# Patient Record
Sex: Male | Born: 1950 | Race: White | Hispanic: No | Smoking: Current every day smoker
Health system: Southern US, Community
[De-identification: ages and names within clinical notes are randomized; demographics above are authoritative.]

## PROBLEM LIST (undated history)

## (undated) DIAGNOSIS — Z720 Tobacco use: Secondary | ICD-10-CM

## (undated) DIAGNOSIS — I10 Essential (primary) hypertension: Secondary | ICD-10-CM

## (undated) DIAGNOSIS — I4892 Unspecified atrial flutter: Principal | ICD-10-CM

## (undated) DIAGNOSIS — E669 Obesity, unspecified: Secondary | ICD-10-CM

## (undated) DIAGNOSIS — I480 Paroxysmal atrial fibrillation: Secondary | ICD-10-CM

## (undated) DIAGNOSIS — M109 Gout, unspecified: Secondary | ICD-10-CM

## (undated) DIAGNOSIS — F419 Anxiety disorder, unspecified: Secondary | ICD-10-CM

## (undated) DIAGNOSIS — J189 Pneumonia, unspecified organism: Secondary | ICD-10-CM

## (undated) DIAGNOSIS — D649 Anemia, unspecified: Secondary | ICD-10-CM

## (undated) DIAGNOSIS — R825 Elevated urine levels of drugs, medicaments and biological substances: Secondary | ICD-10-CM

## (undated) DIAGNOSIS — K219 Gastro-esophageal reflux disease without esophagitis: Secondary | ICD-10-CM

## (undated) DIAGNOSIS — I509 Heart failure, unspecified: Secondary | ICD-10-CM

## (undated) DIAGNOSIS — Z87442 Personal history of urinary calculi: Secondary | ICD-10-CM

## (undated) DIAGNOSIS — Z8739 Personal history of other diseases of the musculoskeletal system and connective tissue: Secondary | ICD-10-CM

## (undated) DIAGNOSIS — R0682 Tachypnea, not elsewhere classified: Secondary | ICD-10-CM

## (undated) DIAGNOSIS — R0602 Shortness of breath: Secondary | ICD-10-CM

## (undated) DIAGNOSIS — M069 Rheumatoid arthritis, unspecified: Secondary | ICD-10-CM

## (undated) HISTORY — DX: Paroxysmal atrial fibrillation: I48.0

## (undated) HISTORY — PX: JOINT REPLACEMENT: SHX530

---

## 2007-09-02 ENCOUNTER — Ambulatory Visit (HOSPITAL_COMMUNITY): Admission: RE | Admit: 2007-09-02 | Discharge: 2007-09-02 | Payer: Self-pay | Admitting: Otolaryngology

## 2007-09-02 ENCOUNTER — Encounter (INDEPENDENT_AMBULATORY_CARE_PROVIDER_SITE_OTHER): Payer: Self-pay | Admitting: Otolaryngology

## 2007-09-02 HISTORY — PX: MICROLARYNGOSCOPY: SHX5208

## 2010-11-11 NOTE — Op Note (Signed)
NAME:  Brian [name], Brian [name]              ACCOUNT NO.:  [id number]   MEDICAL RECORD NO.:  [id number]          PATIENT TYPE:  AMB   LOCATION:  SDS                          FACILITY:  [facility]   PHYSICIAN:  Jefry H. Pollyann Kennedy, MD     DATE OF BIRTH:  1950-09-22   DATE OF PROCEDURE:  09/02/2007  DATE OF DISCHARGE:  09/02/2007                               OPERATIVE REPORT   PREPROCEDURE DIAGNOSIS:  Vocal cord mass and chronic hoarseness.   POSTOPERATIVE DIAGNOSIS:  Vocal cord mass and chronic hoarseness.   PROCEDURE:  Microlaryngoscopy with excision of right vocal cord mass.   SURGEON:  Jefry H. Pollyann Kennedy, MD   ANESTHESIA:  General endotracheal anesthesia was used.   COMPLICATIONS:  No complications.   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  There is a small, corresponding swollen area on the  left side, but it appeared to be reactionary to contact with the right-  sided mass.  No other findings.   REFERRING PHYSICIAN:  [facility]   HISTORY:  This is a 60 year old gentleman with a long smoking history  and a history of chronic hoarseness for the past 4 months.  Risks,  benefits, alternatives, complications of the procedure were explained to  the patient, who seemed to understand, agreed to surgery.   PROCEDURE IN DETAIL:  The patient was taken to the operating room and  placed on the operating table in supine position.  Following induction  of general endotracheal anesthesia, the table was turned 90 degrees and  the patient was draped in a standard fashion.  A laser laryngoscope was  used to view the larynx and this was attached to the Mayo stand using  the suspension apparatus.  A microscope was brought into the field and  was used to further inspect the larynx.  The above-mentioned findings  were noted.   A microlaryngoscopy syringe was used to inject  Xylocaine with  epinephrine into the membranous fold, and the submucosal plane.  An  upbiting microlaryngoscopy scissors was used to incise the superior  mucosa at the base of the lesion, and then to carefully dissect away the  lesion and then to incise the inferior mucosa removing the entire  lesion.  This was sent for pathologic evaluation.  The mucosal defect  was minimal, and the mucosal edges were approximating.  Topical  adrenalin was used on pledgets for hemostasis.  The patient was then  awakened, extubated, and transferred to recovery in stable condition.      Jefry H. Pollyann Kennedy, MD  Electronically Signed     JHR/MEDQ  D:  09/02/2007  T:  09/02/2007  Job:  [id number]   cc:   [facility]

## 2011-03-23 LAB — CBC
Hemoglobin: 14.8
RBC: 4.71
WBC: 10.9 — ABNORMAL HIGH

## 2012-09-04 ENCOUNTER — Observation Stay (HOSPITAL_COMMUNITY)
Admission: EM | Admit: 2012-09-04 | Discharge: 2012-09-05 | DRG: 544 | Disposition: A | Discharge status: Home / Self Care | Payer: BC Managed Care – PPO | Attending: Cardiology | Admitting: Cardiology

## 2012-09-04 ENCOUNTER — Emergency Department (HOSPITAL_COMMUNITY): Payer: BC Managed Care – PPO

## 2012-09-04 ENCOUNTER — Encounter (HOSPITAL_COMMUNITY): Payer: Self-pay | Admitting: *Deleted

## 2012-09-04 DIAGNOSIS — F411 Generalized anxiety disorder: Secondary | ICD-10-CM | POA: Insufficient documentation

## 2012-09-04 DIAGNOSIS — Z720 Tobacco use: Secondary | ICD-10-CM | POA: Diagnosis present

## 2012-09-04 DIAGNOSIS — R079 Chest pain, unspecified: Secondary | ICD-10-CM | POA: Insufficient documentation

## 2012-09-04 DIAGNOSIS — I5033 Acute on chronic diastolic (congestive) heart failure: Secondary | ICD-10-CM

## 2012-09-04 DIAGNOSIS — I5031 Acute diastolic (congestive) heart failure: Secondary | ICD-10-CM | POA: Insufficient documentation

## 2012-09-04 DIAGNOSIS — E669 Obesity, unspecified: Secondary | ICD-10-CM | POA: Insufficient documentation

## 2012-09-04 DIAGNOSIS — D72829 Elevated white blood cell count, unspecified: Secondary | ICD-10-CM | POA: Insufficient documentation

## 2012-09-04 DIAGNOSIS — R0602 Shortness of breath: Secondary | ICD-10-CM | POA: Insufficient documentation

## 2012-09-04 DIAGNOSIS — I4892 Unspecified atrial flutter: Principal | ICD-10-CM | POA: Insufficient documentation

## 2012-09-04 DIAGNOSIS — R7309 Other abnormal glucose: Secondary | ICD-10-CM | POA: Insufficient documentation

## 2012-09-04 DIAGNOSIS — I509 Heart failure, unspecified: Secondary | ICD-10-CM | POA: Insufficient documentation

## 2012-09-04 DIAGNOSIS — F17200 Nicotine dependence, unspecified, uncomplicated: Secondary | ICD-10-CM | POA: Insufficient documentation

## 2012-09-04 HISTORY — DX: Obesity, unspecified: E66.9

## 2012-09-04 HISTORY — DX: Unspecified atrial flutter: I48.92

## 2012-09-04 HISTORY — DX: Anxiety disorder, unspecified: F41.9

## 2012-09-04 HISTORY — DX: Tobacco use: Z72.0

## 2012-09-04 LAB — CBC
MCH: 30.9 pg (ref 26.0–34.0)
MCV: 93.4 fL (ref 78.0–100.0)
Platelets: 267 10*3/uL (ref 150–400)
RDW: 12.8 % (ref 11.5–15.5)
WBC: 11.9 10*3/uL — ABNORMAL HIGH (ref 4.0–10.5)

## 2012-09-04 LAB — BASIC METABOLIC PANEL
CO2: 27 mEq/L (ref 19–32)
Calcium: 8.6 mg/dL (ref 8.4–10.5)
Creatinine, Ser: 0.64 mg/dL (ref 0.50–1.35)
GFR calc non Af Amer: >90 mL/min (ref >90)
Glucose, Bld: 157 mg/dL — ABNORMAL HIGH (ref 70–99)
Sodium: 136 mEq/L (ref 135–145)

## 2012-09-04 LAB — PROTIME-INR
INR: 0.99 (ref 0.00–1.49)
Prothrombin Time: 13 seconds (ref 11.6–15.2)

## 2012-09-04 LAB — DIFFERENTIAL
Basophils Absolute: 0.1 10*3/uL (ref 0.0–0.1)
Eosinophils Absolute: 0.4 10*3/uL (ref 0.0–0.7)
Eosinophils Relative: 4 % (ref 0–5)
Neutrophils Relative %: 73 % (ref 43–77)

## 2012-09-04 LAB — POCT I-STAT TROPONIN I

## 2012-09-04 MED ORDER — DILTIAZEM HCL 100 MG IV SOLR
5.0000 mg/h | Freq: Once | INTRAVENOUS | Status: AC
  Administered 2012-09-04: 5 mg/h via INTRAVENOUS

## 2012-09-04 MED ORDER — DIGOXIN 0.1 MG/ML IJ SOLN
0.5000 mg | Freq: Once | INTRAMUSCULAR | Status: AC
  Administered 2012-09-04: 0.5 mg via INTRAVENOUS
  Filled 2012-09-04: qty 5

## 2012-09-04 MED ORDER — DILTIAZEM LOAD VIA INFUSION
20.0000 mg | Freq: Once | INTRAVENOUS | Status: AC
  Administered 2012-09-04: 20 mg via INTRAVENOUS
  Filled 2012-09-04: qty 20

## 2012-09-04 MED ORDER — FUROSEMIDE 10 MG/ML IJ SOLN
40.0000 mg | Freq: Two times a day (BID) | INTRAMUSCULAR | Status: DC
  Administered 2012-09-04: 40 mg via INTRAVENOUS
  Filled 2012-09-04 (×3): qty 4

## 2012-09-04 MED ORDER — ADENOSINE 6 MG/2ML IV SOLN
INTRAVENOUS | Status: AC
  Filled 2012-09-04: qty 2

## 2012-09-04 MED ORDER — WARFARIN VIDEO: 1.0000 | Freq: Once | Status: DC

## 2012-09-04 MED ORDER — ALPRAZOLAM 0.25 MG PO TABS: 0.2500 mg | ORAL_TABLET | Freq: Every evening | ORAL | Status: DC | PRN

## 2012-09-04 MED ORDER — SODIUM CHLORIDE 0.9 % IJ SOLN: 3.0000 mL | INTRAMUSCULAR | Status: DC | PRN

## 2012-09-04 MED ORDER — DEXTROSE 5 % IV SOLN
5.0000 mg/h | INTRAVENOUS | Status: DC
  Administered 2012-09-04: 10 mg/h via INTRAVENOUS
  Filled 2012-09-04 (×2): qty 100

## 2012-09-04 MED ORDER — SODIUM CHLORIDE 0.9 % IJ SOLN: 3.0000 mL | Freq: Two times a day (BID) | INTRAMUSCULAR | Status: DC

## 2012-09-04 MED ORDER — HEPARIN (PORCINE) IN NACL 100-0.45 UNIT/ML-% IJ SOLN
1450.0000 [IU]/h | INTRAMUSCULAR | Status: DC
  Administered 2012-09-04: 1450 [IU]/h via INTRAVENOUS
  Filled 2012-09-04 (×2): qty 250

## 2012-09-04 MED ORDER — ONDANSETRON HCL 4 MG/2ML IJ SOLN
INTRAMUSCULAR | Status: AC
  Filled 2012-09-04: qty 2

## 2012-09-04 MED ORDER — SODIUM CHLORIDE 0.9 % IV SOLN: INTRAVENOUS | Status: DC

## 2012-09-04 MED ORDER — IBUPROFEN 200 MG PO TABS
400.0000 mg | ORAL_TABLET | Freq: Once | ORAL | Status: AC
  Administered 2012-09-04: 400 mg via ORAL
  Filled 2012-09-04: qty 2

## 2012-09-04 MED ORDER — ADENOSINE 6 MG/2ML IV SOLN
6.0000 mg | Freq: Once | INTRAVENOUS | Status: AC
  Administered 2012-09-04: 6 mg via INTRAVENOUS
  Filled 2012-09-04: qty 4

## 2012-09-04 MED ORDER — ASPIRIN 81 MG PO CHEW
324.0000 mg | CHEWABLE_TABLET | Freq: Once | ORAL | Status: AC
  Administered 2012-09-04: 324 mg via ORAL
  Filled 2012-09-04: qty 4

## 2012-09-04 MED ORDER — ONDANSETRON HCL 4 MG/2ML IJ SOLN
4.0000 mg | Freq: Once | INTRAMUSCULAR | Status: AC
  Administered 2012-09-04: 4 mg via INTRAVENOUS

## 2012-09-04 MED ORDER — WARFARIN - PHARMACIST DOSING INPATIENT
Freq: Every day | Status: DC
  Administered 2012-09-04: 18:00:00

## 2012-09-04 MED ORDER — INSULIN ASPART 100 UNIT/ML ~~LOC~~ SOLN
0.0000 [IU] | Freq: Three times a day (TID) | SUBCUTANEOUS | Status: DC
  Administered 2012-09-04: 2 [IU] via SUBCUTANEOUS

## 2012-09-04 MED ORDER — DILTIAZEM HCL 25 MG/5ML IV SOLN: 20.0000 mg | Freq: Once | INTRAVENOUS | Status: DC

## 2012-09-04 MED ORDER — SODIUM CHLORIDE 0.9 % IV SOLN
INTRAVENOUS | Status: DC
  Administered 2012-09-04: 09:00:00 via INTRAVENOUS

## 2012-09-04 MED ORDER — METOPROLOL TARTRATE 12.5 MG HALF TABLET
12.5000 mg | ORAL_TABLET | Freq: Two times a day (BID) | ORAL | Status: DC
  Administered 2012-09-04 – 2012-09-05 (×2): 12.5 mg via ORAL
  Filled 2012-09-04 (×3): qty 1

## 2012-09-04 MED ORDER — LORAZEPAM 2 MG/ML IJ SOLN
INTRAMUSCULAR | Status: AC
  Administered 2012-09-04: 1 mg
  Filled 2012-09-04: qty 1

## 2012-09-04 MED ORDER — LISINOPRIL 10 MG PO TABS
10.0000 mg | ORAL_TABLET | Freq: Every day | ORAL | Status: DC
  Filled 2012-09-04: qty 1

## 2012-09-04 MED ORDER — ACETAMINOPHEN 325 MG PO TABS: 650.0000 mg | ORAL_TABLET | ORAL | Status: DC | PRN

## 2012-09-04 MED ORDER — SODIUM CHLORIDE 0.9 % IV SOLN: 250.0000 mL | INTRAVENOUS | Status: DC | PRN

## 2012-09-04 MED ORDER — HEPARIN BOLUS VIA INFUSION
5000.0000 [IU] | Freq: Once | INTRAVENOUS | Status: AC
  Administered 2012-09-04: 5000 [IU] via INTRAVENOUS
  Filled 2012-09-04 (×2): qty 5000

## 2012-09-04 MED ORDER — WARFARIN SODIUM 7.5 MG PO TABS
7.5000 mg | ORAL_TABLET | Freq: Once | ORAL | Status: AC
  Administered 2012-09-04: 7.5 mg via ORAL
  Filled 2012-09-04: qty 1

## 2012-09-04 MED ORDER — COUMADIN BOOK
1.0000 | Freq: Once | Status: DC
  Filled 2012-09-04: qty 1

## 2012-09-04 NOTE — ED Notes (Signed)
Gave I Stat 8 resulted to MD Freida Busman

## 2012-09-04 NOTE — ED Notes (Signed)
Pt reports onset of SOB and chest pain x1 week. SOB worse today. Dyspnea with exertion. Productive cough x1 week, brownish sputum. Reports extreme difficulty breathing with coughing spells. Chest pain in central chest, does not radiate. HR 165

## 2012-09-04 NOTE — Progress Notes (Signed)
Pt. Received to room 2027 via ambulance from [facility].  Pt. Oriented to unit and introduced to staff.  Bed locked in low position.  Affect pleasant.  Denies pain.

## 2012-09-04 NOTE — ED Provider Notes (Addendum)
History     CSN: [id number]  Arrival date & time 09/04/12  0807   First MD Initiated Contact with Patient 09/04/12 0830      Chief Complaint  Patient presents with  . Shortness of Breath  . Chest Pain    (Consider location/radiation/quality/duration/timing/severity/associated sxs/prior treatment) Patient is a 62 y.o. male presenting with shortness of breath and chest pain. The history is provided by the patient.  Shortness of Breath Associated symptoms: chest pain   Chest Pain Associated symptoms: shortness of breath    patient here with 3 day history of shortness of breath and chest pain associated diaphoresis. Symptoms have been persistent and nothing makes them better worse. He has no prior history of this in the past. Denies any fever or chills. No vomiting or diarrhea or bloody stools. No treatment used prior to arrival.  History reviewed. No pertinent past medical history.  History reviewed. No pertinent past surgical history.  No family history on file.  History  Substance Use Topics  . Smoking status: Current Every Day Smoker -- 0.50 packs/day    Types: Cigarettes  . Smokeless tobacco: Not on file  . Alcohol Use: Yes     Comment: socially      Review of Systems  Respiratory: Positive for shortness of breath.   Cardiovascular: Positive for chest pain.  All other systems reviewed and are negative.    Allergies  Review of patient's allergies indicates no known allergies.  Home Medications  No current outpatient prescriptions on file.  BP 147/103  Pulse 165  Temp(Src) 97.9 F (36.6 C) (Oral)  Resp 20  Ht 5\' 10"  (1.778 m)  Wt 255 lb (115.667 kg)  BMI 36.59 kg/m2  SpO2 95%  Physical Exam  Nursing note and vitals reviewed. Constitutional: He is oriented to person, place, and time. He appears well-developed and well-nourished.  Non-toxic appearance. No distress.  HENT:  Head: Normocephalic and atraumatic.  Eyes: Conjunctivae, EOM and lids are normal.  Pupils are equal, round, and reactive to light.  Neck: Normal range of motion. Neck supple. No tracheal deviation present. No mass present.  Cardiovascular: Regular rhythm and normal heart sounds.  Tachycardia present.  Exam reveals no gallop.   No murmur heard. Pulmonary/Chest: Effort normal and breath sounds normal. No stridor. No respiratory distress. He has no decreased breath sounds. He has no wheezes. He has no rhonchi. He has no rales.  Abdominal: Soft. Normal appearance and bowel sounds are normal. He exhibits no distension. There is no tenderness. There is no rebound and no CVA tenderness.  Musculoskeletal: Normal range of motion. He exhibits no edema and no tenderness.  Neurological: He is alert and oriented to person, place, and time. He has normal strength. No cranial nerve deficit or sensory deficit. GCS eye subscore is 4. GCS verbal subscore is 5. GCS motor subscore is 6.  Skin: Skin is warm and dry. No abrasion and no rash noted.  Psychiatric: He has a normal mood and affect. His speech is normal and behavior is normal.    ED Course  Procedures (including critical care time)  Labs Reviewed  CBC  DIFFERENTIAL  PROTIME-INR  APTT  BASIC METABOLIC PANEL   No results found.   No diagnosis found.    MDM   Date: 09/04/2012  Rate: 160  Rhythm: atrial flutter  QRS Axis: normal  Intervals: normal  ST/T Wave abnormalities: nonspecific ST changes  Conduction Disutrbances:none  Narrative Interpretation:   Old EKG Reviewed: none available  Patient given adenosine 6 mg IV push which did make his atrial flutter more pronounced. He was subsequently bolus of Cardizem 20 mg IV push and placed on Cardizem drip at 5 mg minute. His heart rate remained elevated. Blood pressure stable and patient was re- bolused with Cardizem 15 mg IV push as Cardizem drip was increased to 10 mg per hour. Patient given Ativan for anxiety. Heart rate transiently decreases down to the 110s but does come  up. Discussed the case with Dr. Patty Sermons, cardiology on-call, patient to be transferred to [facility] [place] to step down.  CRITICAL CARE Performed by: Toy Baker   Total critical care time: 80  Critical care time was exclusive of separately billable procedures and treating other patients.  Critical care was necessary to treat or prevent imminent or life-threatening deterioration.  Critical care was time spent personally by me on the following activities: development of treatment plan with patient and/or surrogate as well as nursing, discussions with consultants, evaluation of patient's response to treatment, examination of patient, obtaining history from patient or surrogate, ordering and performing treatments and interventions, ordering and review of laboratory studies, ordering and review of radiographic studies, pulse oximetry and re-evaluation of patient's condition.  11:54 AM Pt loaded with digoxin for rate controll         Toy Baker, MD 09/04/12 1056  Toy Baker, MD 09/04/12 1154

## 2012-09-04 NOTE — H&P (Signed)
Brian [name] is an 62 y.o. male.   PCP: None Cardiologist: None.  Chief Complaint: dysnea HPI: Came to ER today because of increased dyspnea. Has noted increase in dyspnea for the past week.  Under more stress. Roommate's father has cancer and patient was assisting with the move. Patient denies any chest pain.The patient has had no TIA symptoms. Is on no heart meds.  No prior heart history. No hypertension or diabetes. History of smoking since age 54, now down to half pack a day. Drinks one beer/week. No regular exercise.  Runs a [employer].   History reviewed. No pertinent past medical history.  History reviewed. No pertinent past surgical history.  No family history on file. Social History:  reports that he has been smoking Cigarettes.  He has been smoking about 0.50 packs per day. He does not have any smokeless tobacco history on file. He reports that  drinks alcohol. He reports that he does not use illicit drugs.  Allergies: No Known Allergies  Medications Prior to Admission  Medication Sig Dispense Refill  . vitamin B-12 (CYANOCOBALAMIN) 250 MCG tablet Take 250 mcg by mouth daily.      . vitamin C (ASCORBIC ACID) 500 MG tablet Take 500 mg by mouth 2 (two) times daily.        Results for orders placed during the hospital encounter of 09/04/12 (from the past 48 hour(s))  POCT I-STAT TROPONIN I     Status: None   Collection Time    09/04/12  8:57 AM      Result Value Range   Troponin i, poc 0.00  0.00 - 0.08 ng/mL   Comment 3            Comment: Due to the release kinetics of cTnI,     a negative result within the first hours     of the onset of symptoms does not rule out     myocardial infarction with certainty.     If myocardial infarction is still suspected,     repeat the test at appropriate intervals.  BASIC METABOLIC PANEL     Status: Abnormal   Collection Time    09/04/12 10:10 AM      Result Value Range   Sodium 136  135 - 145 mEq/L   Potassium 4.8  3.5 -  5.1 mEq/L   Chloride 102  96 - 112 mEq/L   CO2 27  19 - 32 mEq/L   Glucose, Bld 157 (*) 70 - 99 mg/dL   BUN 13  6 - 23 mg/dL   Creatinine, Ser 4.69  0.50 - 1.35 mg/dL   Calcium 8.6  8.4 - 62.9 mg/dL   GFR calc non Af Amer >90  >90 mL/min   GFR calc Af Amer >90  >90 mL/min   Comment:            The eGFR has been calculated     using the CKD EPI equation.     This calculation has not been     validated in all clinical     situations.     eGFR's persistently     <90 mL/min signify     possible Chronic Kidney Disease.  CBC     Status: Abnormal   Collection Time    09/04/12 10:10 AM      Result Value Range   WBC 11.9 (*) 4.0 - 10.5 K/uL   RBC 4.70  4.22 - 5.81 MIL/uL   Hemoglobin 14.5  13.0 - 17.0 g/dL   HCT 86.5  78.4 - 69.6 %   MCV 93.4  78.0 - 100.0 fL   MCH 30.9  26.0 - 34.0 pg   MCHC 33.0  30.0 - 36.0 g/dL   RDW 29.5  28.4 - 13.2 %   Platelets 267  150 - 400 K/uL  DIFFERENTIAL     Status: Abnormal   Collection Time    09/04/12 10:10 AM      Result Value Range   Neutrophils Relative 73  43 - 77 %   Neutro Abs 8.7 (*) 1.7 - 7.7 K/uL   Lymphocytes Relative 14  12 - 46 %   Lymphs Abs 1.7  0.7 - 4.0 K/uL   Monocytes Relative 9  3 - 12 %   Monocytes Absolute 1.1 (*) 0.1 - 1.0 K/uL   Eosinophils Relative 4  0 - 5 %   Eosinophils Absolute 0.4  0.0 - 0.7 K/uL   Basophils Relative 1  0 - 1 %   Basophils Absolute 0.1  0.0 - 0.1 K/uL  PROTIME-INR     Status: None   Collection Time    09/04/12 10:10 AM      Result Value Range   Prothrombin Time 13.0  11.6 - 15.2 seconds   INR 0.99  0.00 - 1.49  APTT     Status: None   Collection Time    09/04/12 10:10 AM      Result Value Range   aPTT 33  24 - 37 seconds  PRO B NATRIURETIC PEPTIDE     Status: Abnormal   Collection Time    09/04/12 10:24 AM      Result Value Range   Pro B Natriuretic peptide (BNP) 525.5 (*) 0 - 125 pg/mL   Dg Chest Port 1 View  09/04/2012  *RADIOLOGY REPORT*  Clinical Data: Chest pain, tachycardia   PORTABLE CHEST - 1 VIEW  Comparison: None.  Findings: Enlarged cardiac silhouette and mediastinal contours, possibly accentuated due to AP projection decreased lung volumes. Pulmonary vasculature is indistinct.  Small left-sided pleural effusion.  Bibasilar heterogeneous opacity.  No pneumothorax. Unchanged bones.  IMPRESSION: 1.  Overall finding suggestive of pulmonary edema with small left- sided effusion and bibasilar opacities, atelectasis versus infiltrate.  A follow-up chest radiograph in 4 to 6 weeks after treatment is recommended to ensure resolution.  2.  Enlarged cardiac silhouette, possibly accentuated due to decreased lung volumes and AP projection.  Further evaluation with cardiac echo may be performed as clinically indicated.   Original Report Authenticated By: Tacey Ruiz, MD    ROS: No history of cardiomegaly. Denies fever or chills.Has had cough. No GI bleeding. No dysuria. No thyroid disease.  Blood pressure 106/65, pulse 118, temperature 98.2 F (36.8 C), temperature source Oral, resp. rate 22, height 5\' 10"  (1.778 m), weight 253 lb 15.5 oz (115.2 kg), SpO2 95.00%. The patient appears to be in no distress. Lying flat without difficulty.  Head and neck exam reveals that the pupils are equal and reactive.  The extraocular movements are full.  There is no scleral icterus.  Mouth and pharynx are benign.  No lymphadenopathy.  No carotid bruits.  The jugular venous pressure is normal.  Thyroid is not enlarged or tender.  Chest reveals mild expiratory wheezing when supine.  Heart reveals no abnormal lift or heave.  First and second heart sounds are normal.  There is no murmur gallop rub or click. Pulse is rapid and irregular.  The abdomen is soft and nontender.  Bowel sounds are normoactive.  There is no hepatosplenomegaly or mass.  There are no abdominal bruits.  Extremities reveal no phlebitis or edema.  Pedal pulses are good.  There is no cyanosis or clubbing.  Neurologic exam is  normal strength and no lateralizing weakness.  No sensory deficits.  Integument reveals no rash  EKG shows atrial flutter with rapid ventricular response.  Assessment/Plan 1. Atrial flutter, uncertain duration, but by history present for more than one week. 2. Congestive heart failure, etiology to be determined (2D echo pending). 3.  Hyperglycemia, not known to be a diabetic. 4.  Exogenous obesity. 5.  Mild leukocytosis.  Plan: Admit to telemetry. Rate control with IV diltiazem and add metoprolol. Diuresis with IV lasix 40 mg BID. BP soft so delay adding ACE until echo results are known.Check A1C re ? Of diabetes. Check lipids in am. Start warfarin. If he fails to convert to NSR will consider outpatient DCCV after 4 weeks of adequate INRs.Followup CBC in am re leukocytosis.  Cassell Clement 09/04/2012, 3:52 PM

## 2012-09-04 NOTE — Consult Note (Signed)
ANTICOAGULATION CONSULT NOTE - Initial Consult  Pharmacy Consult for Coumadin with Heparin bridging Indication: A-flutter w/RVR, CHF  Allergies: No Known Allergies  Height/Weight: Height: 5\' 10"  (177.8 cm) Weight: 253 lb 15.5 oz (115.2 kg) IBW/kg (Calculated) : 73 Dosing weight 98 kg  Vital Signs: BP 106/65  Pulse 118  Temp(Src) 98.2 F (36.8 C) (Oral)  Resp 22  Ht 5\' 10"  (1.778 m)  Wt 253 lb 15.5 oz (115.2 kg)  BMI 36.44 kg/m2  SpO2 95%  Active Problems: Active Problems:   * No active hospital problems. *   Labs:  Recent Labs  09/04/12 1010  HGB 14.5  HCT 43.9  PLT 267  APTT 33  LABPROT 13.0  INR 0.99  CREATININE 0.64   Lab Results  Component Value Date   INR 0.99 09/04/2012   Estimated Creatinine Clearance: 123.3 ml/min (by C-G formula based on Cr of 0.64).  Medical / Surgical History: History reviewed. No pertinent past medical history. History reviewed. No pertinent past surgical history.  Medications:  Prescriptions prior to admission  Medication Sig Dispense Refill  . vitamin B-12 (CYANOCOBALAMIN) 250 MCG tablet Take 250 mcg by mouth daily.      . vitamin C (ASCORBIC ACID) 500 MG tablet Take 500 mg by mouth 2 (two) times daily.        Assessment:  62 y.o.male admitted with new onset of A-flutter w/RVR and CHF of unknown etiology.  Coumadin with Heparin bridging is to be started per protocol..  Baseline coags wnl.  Goal of Therapy:   INR 2-3  Heparin level 0.3-0.7 units/ml      Plan:   Heparin bolus 5000 units, then  Begin heparin infusion at 1450 units/hr.  Coumadin 7.5 mg today.  Check Heparin level in 6 hours Daily Heparin level, INR, CBC.   Stramoski, Elisha Headland,  Pharm.D.. 09/04/2012,  4:24 PM

## 2012-09-05 ENCOUNTER — Other Ambulatory Visit: Payer: Self-pay

## 2012-09-05 ENCOUNTER — Encounter (HOSPITAL_COMMUNITY): Payer: Self-pay | Admitting: Nurse Practitioner

## 2012-09-05 ENCOUNTER — Inpatient Hospital Stay (HOSPITAL_COMMUNITY): Payer: BC Managed Care – PPO

## 2012-09-05 DIAGNOSIS — F419 Anxiety disorder, unspecified: Secondary | ICD-10-CM | POA: Diagnosis present

## 2012-09-05 DIAGNOSIS — I4892 Unspecified atrial flutter: Principal | ICD-10-CM | POA: Diagnosis present

## 2012-09-05 DIAGNOSIS — I4891 Unspecified atrial fibrillation: Secondary | ICD-10-CM

## 2012-09-05 DIAGNOSIS — I5033 Acute on chronic diastolic (congestive) heart failure: Secondary | ICD-10-CM

## 2012-09-05 DIAGNOSIS — E669 Obesity, unspecified: Secondary | ICD-10-CM | POA: Diagnosis present

## 2012-09-05 DIAGNOSIS — I5030 Unspecified diastolic (congestive) heart failure: Secondary | ICD-10-CM | POA: Insufficient documentation

## 2012-09-05 DIAGNOSIS — E66812 Obesity, class 2: Secondary | ICD-10-CM | POA: Diagnosis present

## 2012-09-05 LAB — POCT I-STAT, CHEM 8
Calcium, Ion: 1.1 mmol/L — ABNORMAL LOW (ref 1.13–1.30)
Chloride: 106 mEq/L (ref 96–112)
HCT: 46 % (ref 39.0–52.0)
Potassium: 6.6 mEq/L (ref 3.5–5.1)

## 2012-09-05 LAB — BASIC METABOLIC PANEL
BUN: 18 mg/dL (ref 6–23)
Calcium: 9.4 mg/dL (ref 8.4–10.5)
Chloride: 102 mEq/L (ref 96–112)
Creatinine, Ser: 0.85 mg/dL (ref 0.50–1.35)
GFR calc Af Amer: >90 mL/min (ref >90)
GFR calc non Af Amer: >90 mL/min (ref >90)

## 2012-09-05 LAB — URINALYSIS, ROUTINE W REFLEX MICROSCOPIC
Bilirubin Urine: NEGATIVE
Glucose, UA: NEGATIVE mg/dL
Hgb urine dipstick: NEGATIVE
Specific Gravity, Urine: 1.026 (ref 1.005–1.030)
Urobilinogen, UA: 0.2 mg/dL (ref 0.0–1.0)

## 2012-09-05 LAB — LIPID PANEL
Cholesterol: 143 mg/dL (ref 0–200)
HDL: 40 mg/dL (ref >39)
LDL Cholesterol: 62 mg/dL (ref 0–99)
Total CHOL/HDL Ratio: 3.6 RATIO
Triglycerides: 203 mg/dL — ABNORMAL HIGH (ref <150)
VLDL: 41 mg/dL — ABNORMAL HIGH (ref 0–40)

## 2012-09-05 LAB — URINE MICROSCOPIC-ADD ON

## 2012-09-05 LAB — GLUCOSE, CAPILLARY: Glucose-Capillary: 137 mg/dL — ABNORMAL HIGH (ref 70–99)

## 2012-09-05 LAB — HEMOGLOBIN A1C: Hgb A1c MFr Bld: 5.7 % — ABNORMAL HIGH (ref <5.7)

## 2012-09-05 MED ORDER — LISINOPRIL 5 MG PO TABS: 5.0000 mg | ORAL_TABLET | Freq: Every day | ORAL | Status: DC

## 2012-09-05 MED ORDER — FUROSEMIDE 40 MG PO TABS: 40.0000 mg | ORAL_TABLET | Freq: Every day | ORAL | Status: DC

## 2012-09-05 MED ORDER — FUROSEMIDE 40 MG PO TABS
40.0000 mg | ORAL_TABLET | Freq: Two times a day (BID) | ORAL | Status: DC
  Administered 2012-09-05: 40 mg via ORAL
  Filled 2012-09-05 (×3): qty 1

## 2012-09-05 MED ORDER — RIVAROXABAN 20 MG PO TABS
20.0000 mg | ORAL_TABLET | Freq: Once | ORAL | Status: AC
  Administered 2012-09-05: 20 mg via ORAL
  Filled 2012-09-05: qty 1

## 2012-09-05 MED ORDER — RIVAROXABAN 20 MG PO TABS: 20.0000 mg | ORAL_TABLET | Freq: Every day | ORAL | Status: DC

## 2012-09-05 MED ORDER — DILTIAZEM HCL 30 MG PO TABS
30.0000 mg | ORAL_TABLET | Freq: Four times a day (QID) | ORAL | Status: DC
  Administered 2012-09-05: 30 mg via ORAL
  Filled 2012-09-05 (×4): qty 1

## 2012-09-05 MED ORDER — DILTIAZEM HCL ER COATED BEADS 120 MG PO CP24
120.0000 mg | ORAL_CAPSULE | Freq: Every day | ORAL | Status: DC
  Administered 2012-09-05: 120 mg via ORAL
  Filled 2012-09-05: qty 1

## 2012-09-05 MED ORDER — RIVAROXABAN 20 MG PO TABS
20.0000 mg | ORAL_TABLET | Freq: Every day | ORAL | Status: DC
  Filled 2012-09-05: qty 1

## 2012-09-05 MED ORDER — DILTIAZEM HCL ER COATED BEADS 120 MG PO CP24: 120.0000 mg | ORAL_CAPSULE | Freq: Every day | ORAL | Status: DC

## 2012-09-05 NOTE — Progress Notes (Signed)
Utilization Review Completed.[name], Brian T3/03/2013  

## 2012-09-05 NOTE — Progress Notes (Signed)
  Echocardiogram 2D Echocardiogram has been performed.  Brian, [name] 09/05/2012, 12:26 PM

## 2012-09-05 NOTE — Care Management Note (Unsigned)
    Page 1 of 1   09/05/2012     5:05:03 PM   CARE MANAGEMENT NOTE 09/05/2012  Patient:  Brian [name], Brian [name]   Account Number:  [id number]  Date Initiated:  09/05/2012  Documentation initiated by:  AMERSON,JULIE  Subjective/Objective Assessment:   PT ADM ON 09/04/12 WITH CP, CHF, AFIB.  PTA, PT INDEPENDENT, LIVES ALONE.  HE HAS SUPPORTIVE SISTER.     Action/Plan:   PT TO DC HOME ON [place].  PT GIVEN [place] [employer] CARD TO REDUCE COPAY TO $10 PER MONTH.   Anticipated DC Date:  09/06/2012   Anticipated DC Plan:  HOME/SELF CARE      DC Planning Services  CM consult  Medication Assistance      Choice offered to / List presented to:             Status of service:  In process, will continue to follow Medicare Important Message given?   (If response is "NO", the following Medicare IM given date fields will be blank) Date Medicare IM given:   Date Additional Medicare IM given:    Discharge Disposition:    Per UR Regulation:  Reviewed for med. necessity/level of care/duration of stay  If discussed at Long Length of Stay Meetings, dates discussed:    Comments:  09/05/12 JULIE AMERSON,RN,BSN [phone] PT HAVING ISSUES WITH INSURANCE:  HE DOES NOT HAVE A CURRENT CARD TO USE TO GET RX FILLED WITH.  PT GIVEN 10 DAY FREE TRIAL CARD TO ALLOW TIME FOR HIS NEW CARD TO BE MAILED.  MD/PA: PLEASE GIVE EXTRA RX FOR 10 DAY SUPPLY AT DC SO THAT PT MAY UTILIZE 10 DAY FREE TRIAL CARD.  THANKS!

## 2012-09-05 NOTE — Progress Notes (Signed)
Subjective:  Still feels weak.  No acute dyspnea.  No chest pain. Rhythm converted to NSR this am. Morning labs not yet available.  Objective:  Vital Signs in the last 24 hours: Temp:  [97.8 F (36.6 C)-98.2 F (36.8 C)] 97.8 F (36.6 C) (03/10 0500) Pulse Rate:  [74-165] 74 (03/10 0500) Resp:  [20-29] 22 (03/10 0500) BP: (100-190)/(51-166) 108/51 mmHg (03/10 0500) SpO2:  [90 %-98 %] 94 % (03/10 0500) Weight:  [253 lb 15.5 oz (115.2 kg)-255 lb (115.667 kg)] 253 lb 15.5 oz (115.2 kg) (03/09 1400)  Intake/Output from previous day: 03/09 0701 - 03/10 0700 In: 586.2 [P.O.:480; I.V.:106.2] Out: 200 [Urine:200] Intake/Output from this shift:    . diltiazem  30 mg Oral Q6H  . furosemide  40 mg Oral BID  . insulin aspart  0-15 Units Subcutaneous TID WC  . metoprolol tartrate  12.5 mg Oral BID  . rivaroxaban  20 mg Oral Daily  . sodium chloride  3 mL Intravenous Q12H  . warfarin  1 each Does not apply Once      Physical Exam: The patient appears to be in no distress.  Head and neck exam reveals that the pupils are equal and reactive.  The extraocular movements are full.  There is no scleral icterus.  Mouth and pharynx are benign.  No lymphadenopathy.  No carotid bruits.  The jugular venous pressure is normal.  Thyroid is not enlarged or tender.  Chest is clear to percussion and auscultation.  No rales or rhonchi.  Expansion of the chest is symmetrical.  Heart reveals no abnormal lift or heave.  First and second heart sounds are normal.  There is no murmur gallop rub or click.  The abdomen is soft and nontender.  Bowel sounds are normoactive.  There is no hepatosplenomegaly or mass.  There are no abdominal bruits.  Extremities reveal no phlebitis or edema.  Pedal pulses are good.  There is no cyanosis or clubbing.  Neurologic exam is normal strength and no lateralizing weakness.  No sensory deficits.  Integument reveals no rash  Lab Results:  Recent Labs   09/04/12 1010  WBC 11.9*  HGB 14.5  PLT 267    Recent Labs  09/04/12 1010  NA 136  K 4.8  CL 102  CO2 27  GLUCOSE 157*  BUN 13  CREATININE 0.64   No results found for this basename: TROPONINI, CK, MB,  in the last 72 hours Hepatic Function Panel No results found for this basename: PROT, ALBUMIN, AST, ALT, ALKPHOS, BILITOT, BILIDIR, IBILI,  in the last 72 hours No results found for this basename: CHOL,  in the last 72 hours No results found for this basename: PROTIME,  in the last 72 hours  Imaging: Dg Chest Port 1 View  09/04/2012  *RADIOLOGY REPORT*  Clinical Data: Chest pain, tachycardia  PORTABLE CHEST - 1 VIEW  Comparison: None.  Findings: Enlarged cardiac silhouette and mediastinal contours, possibly accentuated due to AP projection decreased lung volumes. Pulmonary vasculature is indistinct.  Small left-sided pleural effusion.  Bibasilar heterogeneous opacity.  No pneumothorax. Unchanged bones.  IMPRESSION: 1.  Overall finding suggestive of pulmonary edema with small left- sided effusion and bibasilar opacities, atelectasis versus infiltrate.  A follow-up chest radiograph in 4 to 6 weeks after treatment is recommended to ensure resolution.  2.  Enlarged cardiac silhouette, possibly accentuated due to decreased lung volumes and AP projection.  Further evaluation with cardiac echo may be performed as clinically indicated.  Original Report Authenticated By: Tacey Ruiz, MD     Cardiac Studies: Telemetry shows NSR 2D echo pending Assessment/Plan:  1. Atrial flutter, uncertain duration, but by history present for more than one week. Now back in NSR 2. Congestive heart failure, etiology to be determined (2D echo pending).  3. Hyperglycemia, not known to be a diabetic. A1C pending.  4. Exogenous obesity. Lipid panel pending. 5. Mild leukocytosis. Repeat CBC pending.  Plan: Will switch to xarelto for anticoagulation. Switch to oral diltiazem and lasix today. Ambulate.  LOS: 1  day    Brian [name] 09/05/2012, 7:54 AM

## 2012-09-05 NOTE — Discharge Summary (Signed)
Patient ID: Brian [name],  MRN: [id number], DOB/AGE: 02-15-1951 62 y.o.  Admit date: 09/04/2012 Discharge date: 09/05/2012  Primary Cardiologist: Wylene Simmer, MD  Discharge Diagnoses Principal Problem:   Atrial flutter Active Problems:   Acute diastolic CHF (congestive heart failure)   Tobacco abuse   Obesity   Anxiety  Allergies No Known Allergies  Procedures  2D Echocardiogram 09/05/2012  Study Conclusions  Left ventricle: The cavity size was normal. Wall thickness was normal. Systolic function was normal. The estimated ejection fraction was in the range of 55% to 60%. Left ventricular diastolic function parameters were normal _____________  History of Present Illness  62 y/o male without prior cardiac history.  He was in his usual state of health until approximately 1 week prior to admission when he began to experience progressive dyspnea without chest pain.  Due to progression of symptoms, he presented to the [facility] ED on 3/9 and was found to be in atrial flutter with a rapid ventricular response (118).  Chest X-ray showed mild pulmonary edema.  Pt was seen by cardiology, placed on IV diltiazem and heparin, and admitted for further evaluation.   Hospital Course  Pt ruled out for MI.  He converted to sinus rhythm.  He was converted from IV to oral diltiazem this morning and heparin was discontinued in favor of xarelto therapy.  Case management worked with the patient to ensure that he would be able to afford xarelto and a 10 day trial card and co-pay reduction card have been provided.  2D echocardiogram performed this afternoon showed normal LV function.  Lasix was converted from IV to oral after a 2 lb weight reduction with IV diuresis.  He was hyperkalemic on admission and thus did not require potassium supplementation.  He will be discharged home today in good condition and we will arrange f/u within 7 days for repeat bmet and f/u office visit.  He has been counseled on the  importance of daily weight and symptom reporting as well as tobacco cessation.  Discharge Vitals Blood pressure 124/79, pulse 66, temperature 98.2 F (36.8 C), temperature source Oral, resp. rate 18, height 5\' 10"  (1.778 m), weight 253 lb 15.5 oz (115.2 kg), SpO2 93.00%.  Filed Weights   09/04/12 0820 09/04/12 1400  Weight: 255 lb (115.667 kg) 253 lb 15.5 oz (115.2 kg)   Labs  CBC  Recent Labs  09/04/12 0759 09/04/12 1010  WBC  --  11.9*  NEUTROABS  --  8.7*  HGB 15.6 14.5  HCT 46.0 43.9  MCV  --  93.4  PLT  --  267   Basic Metabolic Panel  Recent Labs  09/04/12 1010 09/04/12 1602 09/05/12 0900  NA 136  --  141  K 4.8  --  5.1  CL 102  --  102  CO2 27  --  30  GLUCOSE 157*  --  145*  BUN 13  --  18  CREATININE 0.64  --  0.85  CALCIUM 8.6  --  9.4  MG  --  1.9  --    Hemoglobin A1C  Recent Labs  09/05/12 0900  HGBA1C 5.7*   Fasting Lipid Panel  Recent Labs  09/05/12 0900  CHOL 143  HDL 40  LDLCALC 62  TRIG 203*  CHOLHDL 3.6   Disposition  Pt is being discharged home today in good condition.  Follow-up Plans & Appointments      Follow-up Information   Follow up with Cassell Clement, MD In 1 week. (  we will arrange.)    Contact information:   [address] [place] [address] [phone]      Discharge Medications    Medication List    TAKE these medications       diltiazem 120 MG 24 hr capsule  Commonly known as:  CARDIZEM CD  Take 1 capsule (120 mg total) by mouth daily.     furosemide 40 MG tablet  Commonly known as:  LASIX  Take 1 tablet (40 mg total) by mouth daily.  Start taking on:  09/06/2012     lisinopril 5 MG tablet  Commonly known as:  PRINIVIL,ZESTRIL  Take 1 tablet (5 mg total) by mouth daily.     Rivaroxaban 20 MG Tabs  Commonly known as:  XARELTO  Take 1 tablet (20 mg total) by mouth daily with supper.  Start taking on:  09/06/2012     vitamin B-12 250 MCG tablet  Commonly known as:   CYANOCOBALAMIN  Take 250 mcg by mouth daily.     vitamin C 500 MG tablet  Commonly known as:  ASCORBIC ACID  Take 500 mg by mouth 2 (two) times daily.      Outstanding Labs/Studies  bmet in 1 week  Duration of Discharge Encounter   Greater than 30 minutes including physician time.  Signed, Nicolasa Ducking NP 09/05/2012, 5:51 PM

## 2012-09-06 ENCOUNTER — Telehealth: Payer: Self-pay | Admitting: Cardiology

## 2012-09-06 LAB — URINE CULTURE: Colony Count: 9000

## 2012-09-06 LAB — GLUCOSE, CAPILLARY

## 2012-09-06 NOTE — Telephone Encounter (Signed)
New Problem:    I scheduled a 7 day TOC appointment per Thayer Ohm PA from the [facility] Voicemail for 09/14/12 with Dr. Cassell Clement per patient's preference.

## 2012-09-07 NOTE — Telephone Encounter (Signed)
Pt is doing well, he is aware of his appointment with Dr. Patty Sermons 09/12/12.

## 2012-09-14 ENCOUNTER — Ambulatory Visit (INDEPENDENT_AMBULATORY_CARE_PROVIDER_SITE_OTHER): Payer: BC Managed Care – PPO | Admitting: Cardiology

## 2012-09-14 ENCOUNTER — Encounter: Payer: Self-pay | Admitting: Cardiology

## 2012-09-14 VITALS — BP 118/72 | HR 158 | Ht 70.0 in | Wt 251.8 lb

## 2012-09-14 DIAGNOSIS — I509 Heart failure, unspecified: Secondary | ICD-10-CM

## 2012-09-14 DIAGNOSIS — F17200 Nicotine dependence, unspecified, uncomplicated: Secondary | ICD-10-CM

## 2012-09-14 DIAGNOSIS — I5030 Unspecified diastolic (congestive) heart failure: Secondary | ICD-10-CM

## 2012-09-14 DIAGNOSIS — I4892 Unspecified atrial flutter: Secondary | ICD-10-CM

## 2012-09-14 MED ORDER — DILTIAZEM HCL ER COATED BEADS 240 MG PO CP24: 240.0000 mg | ORAL_CAPSULE | Freq: Every day | ORAL | Status: DC

## 2012-09-14 NOTE — Assessment & Plan Note (Signed)
The patient has decreased his cigarette smoking but has not quit yet.  He denies any cough or sputum production

## 2012-09-14 NOTE — Patient Instructions (Addendum)
INCREASE YOUR DILTIAZEM CD TO 240 MG DAILY, RX SENT TO [facility]  Your physician recommends that you schedule a follow-up appointment in: 2 WEEK OV/EKG

## 2012-09-14 NOTE — Progress Notes (Signed)
Brian [name] Date of Birth:  May 08, 1951 [facility] [address] [place], [place]  [address] [phone]         Fax   [phone]  History of Present Illness: This pleasant 62 year old gentleman is seen for a post hospital office visit.  He was recently admitted on 09/04/12 and discharged on 09/05/12.  He was admitted in atrial flutter and with acute diastolic CHF.  Other diagnoses included tobacco abuse obesity and anxiety.  In the hospital he converted on IV diltiazem and was able to be discharged the next day he had an echocardiogram 09/05/12 which showed normal systolic function with an ejection fraction of 55-60% his initial chest x-ray showed pulmonary edema responded to IV Lasix his weight on discharge was 253 pounds in the hospital. Current Outpatient Prescriptions  Medication Sig Dispense Refill  . diltiazem (CARDIZEM CD) 240 MG 24 hr capsule Take 1 capsule (240 mg total) by mouth daily.  30 capsule  6  . furosemide (LASIX) 40 MG tablet Take 1 tablet (40 mg total) by mouth daily.  30 tablet  6  . lisinopril (PRINIVIL,ZESTRIL) 5 MG tablet Take 1 tablet (5 mg total) by mouth daily.  30 tablet  6  . Rivaroxaban (XARELTO) 20 MG TABS Take 1 tablet (20 mg total) by mouth daily with supper.  30 tablet  6  . vitamin B-12 (CYANOCOBALAMIN) 250 MCG tablet Take 250 mcg by mouth daily.      . vitamin C (ASCORBIC ACID) 500 MG tablet Take 500 mg by mouth 2 (two) times daily.       No current facility-administered medications for this visit.    No Known Allergies  Patient Active Problem List  Diagnosis  . Tobacco abuse  . Obesity  . Diastolic CHF  . Atrial flutter  . Anxiety  . Acute diastolic CHF (congestive heart failure)    History  Smoking status  . Current Every Day Smoker -- 1.00 packs/day for 50 years  . Types: Cigarettes  Smokeless tobacco  . Never Used    History  Alcohol Use  . Yes    Comment: socially    No family history on  file.  Review of Systems: Constitutional: no fever chills diaphoresis or fatigue or change in weight.  Head and neck: no hearing loss, no epistaxis, no photophobia or visual disturbance. Respiratory: No cough, shortness of breath or wheezing. Cardiovascular: No chest pain peripheral edema, palpitations. Gastrointestinal: No abdominal distention, no abdominal pain, no change in bowel habits hematochezia or melena. Genitourinary: No dysuria, no frequency, no urgency, no nocturia. Musculoskeletal:No arthralgias, no back pain, no gait disturbance or myalgias. Neurological: No dizziness, no headaches, no numbness, no seizures, no syncope, no weakness, no tremors. Hematologic: No lymphadenopathy, no easy bruising. Psychiatric: No confusion, no hallucinations, no sleep disturbance.    Physical Exam: Filed Vitals:   09/14/12 1605  BP: 118/72  Pulse: 158   the general appearance reveals a slightly obese gentleman in no acute distress.The head and neck exam reveals pupils equal and reactive.  Extraocular movements are full.  There is no scleral icterus.  The mouth and pharynx are normal.  The neck is supple.  The carotids reveal no bruits.  The jugular venous pressure is normal.  The  thyroid is not enlarged.  There is no lymphadenopathy.  The chest is clear to percussion and auscultation.  There are no rales or rhonchi.  Expansion of the chest is symmetrical.  The precordium is quiet.  The pulse is rapid and regular and consistent with atrial flutter with 21 response.  The first heart sound is normal.  The second heart sound is physiologically split.  There is no murmur gallop rub or click.  There is no abnormal lift or heave.  The abdomen is soft and nontender.  The bowel sounds are normal.  The liver and spleen are not enlarged.  There are no abdominal masses.  There are no abdominal bruits.  Extremities reveal good pedal pulses.  There is no phlebitis or edema.  There is no cyanosis or clubbing.   Strength is normal and symmetrical in all extremities.  There is no lateralizing weakness.  There are no sensory deficits.  The skin is warm and dry.  There is no rash.     Assessment / Plan: Continue same medication except we're increasing diltiazem up to 240 mg daily for better AV blocking.  Recheck in 2 weeks for followup office visit EKG and basal metabolic panel.  Lose weight.  Work on cutting back further on tobacco abuse.

## 2012-09-14 NOTE — Assessment & Plan Note (Signed)
The patient has not been experiencing any orthopnea or paroxysmal nocturnal dyspnea or been aware of any increased exertional dyspnea.

## 2012-09-14 NOTE — Assessment & Plan Note (Signed)
The patient was discharged home from the hospital on 09/05/12 in normal sinus rhythm.  He returns today for followup.  He has not been aware of his heart rhythm.  He is noted to have a tachycardia and EKG confirms that he is back in atrial flutter with a 21 ventricular response and his ventricular rate is 157.  He is tolerating it without symptoms and in particular no chest pain or shortness of breath.  He is on Xarelto.

## 2012-09-20 ENCOUNTER — Encounter: Payer: Self-pay | Admitting: Cardiology

## 2012-09-20 ENCOUNTER — Ambulatory Visit (INDEPENDENT_AMBULATORY_CARE_PROVIDER_SITE_OTHER): Payer: BC Managed Care – PPO | Admitting: Cardiology

## 2012-09-20 VITALS — BP 128/68 | HR 78 | Ht 70.0 in | Wt 251.0 lb

## 2012-09-20 DIAGNOSIS — I509 Heart failure, unspecified: Secondary | ICD-10-CM

## 2012-09-20 DIAGNOSIS — I4892 Unspecified atrial flutter: Secondary | ICD-10-CM

## 2012-09-20 DIAGNOSIS — F17200 Nicotine dependence, unspecified, uncomplicated: Secondary | ICD-10-CM

## 2012-09-20 DIAGNOSIS — I5030 Unspecified diastolic (congestive) heart failure: Secondary | ICD-10-CM

## 2012-09-20 DIAGNOSIS — Z720 Tobacco use: Secondary | ICD-10-CM

## 2012-09-20 MED ORDER — DILTIAZEM HCL ER COATED BEADS 360 MG PO CP24: 240.0000 mg | ORAL_CAPSULE | Freq: Every day | ORAL | Status: DC

## 2012-09-20 MED ORDER — VARENICLINE TARTRATE 1 MG PO TABS: 1.0000 mg | ORAL_TABLET | Freq: Two times a day (BID) | ORAL | Status: DC

## 2012-09-20 MED ORDER — VARENICLINE TARTRATE 0.5 MG X 11 & 1 MG X 42 PO MISC: ORAL | Status: DC

## 2012-09-20 NOTE — Assessment & Plan Note (Addendum)
Has not had any recurrent symptoms to suggest recurrent CHF.  He has not had any acute dyspnea.  Weight is down to 251 loss of 2 pounds since hospitalization

## 2012-09-20 NOTE — Assessment & Plan Note (Signed)
The patient genuinely desires to quit smoking.  He is requesting chantix.  We will prescribe Chantix starter pack and 2 continuation packs for a total of 12 weeks of therapy.

## 2012-09-20 NOTE — Assessment & Plan Note (Signed)
On his last visit he was still in atrial flutter with rapid ventricular rate and his Cardizem dose was increased from 120 mg up to 240 mg daily.  With the increase in dose his heart rate has slowed partially but not enough.  The patient is aware of his heart fluttering in his chest.  He feels that stress is contributing factor to his cardiac problem.  He states that he has not had a vacation in 4 years.  He works as a Pharmacologist. I gave him a note so that he could take some vacation until March 31.

## 2012-09-20 NOTE — Patient Instructions (Signed)
INCREASE DILTIAZEM CD TO 360 MG DAILY  START CHANTIX AS DIRECTED TO HELP YOU QUIT SMOKING  Your physician recommends that you schedule a follow-up appointment in: 1 month /ekg

## 2012-09-20 NOTE — Progress Notes (Signed)
Brian [name] Date of Birth:  07-07-1950 [facility] [address] [place], [place]  [address] [phone]         Fax   [phone]  History of Present Illness: This pleasant 62 year old gentleman is seen for a scheduled followup office visit. He was recently admitted on 09/04/12 and discharged on 09/05/12. He was admitted in atrial flutter and with acute diastolic CHF. Other diagnoses included tobacco abuse obesity and anxiety. In the hospital he converted on IV diltiazem and was able to be discharged the next day.  The patient  had an echocardiogram 09/05/12 which showed normal systolic function with an ejection fraction of 55-60%.   He is initial chest x-ray showed pulmonary edema responded to IV Lasix and his weight on discharge was 253 pounds in the hospital.   Current Outpatient Prescriptions  Medication Sig Dispense Refill  . diltiazem (CARDIZEM CD) 360 MG 24 hr capsule Take 1 capsule (360 mg total) by mouth daily.  30 capsule  6  . furosemide (LASIX) 40 MG tablet Take 1 tablet (40 mg total) by mouth daily.  30 tablet  6  . lisinopril (PRINIVIL,ZESTRIL) 5 MG tablet Take 1 tablet (5 mg total) by mouth daily.  30 tablet  6  . Rivaroxaban (XARELTO) 20 MG TABS Take 1 tablet (20 mg total) by mouth daily with supper.  30 tablet  6  . vitamin B-12 (CYANOCOBALAMIN) 250 MCG tablet Take 250 mcg by mouth daily.      . vitamin C (ASCORBIC ACID) 500 MG tablet Take 500 mg by mouth 2 (two) times daily.      . varenicline (CHANTIX STARTING MONTH PAK) 0.5 MG X 11 & 1 MG X 42 tablet Take one 0.5 mg tablet by mouth once daily for 3 days, then increase to one 0.5 mg tablet twice daily for 4 days, then increase to one 1 mg tablet twice daily.  53 tablet  0  . varenicline (CHANTIX) 1 MG tablet Take 1 tablet (1 mg total) by mouth 2 (two) times daily.  60 tablet  1   No current facility-administered medications for this visit.    No Known Allergies  Patient Active Problem List    Diagnosis  . Tobacco abuse  . Obesity  . Diastolic CHF  . Atrial flutter  . Anxiety  . Acute diastolic CHF (congestive heart failure)    History  Smoking status  . Current Every Day Smoker -- 1.00 packs/day for 50 years  . Types: Cigarettes  Smokeless tobacco  . Never Used    History  Alcohol Use  . Yes    Comment: socially    No family history on file.  Review of Systems: Constitutional: no fever chills diaphoresis or fatigue or change in weight.  Head and neck: no hearing loss, no epistaxis, no photophobia or visual disturbance. Respiratory: No cough, shortness of breath or wheezing. Cardiovascular: No chest pain peripheral edema, palpitations. Gastrointestinal: No abdominal distention, no abdominal pain, no change in bowel habits hematochezia or melena. Genitourinary: No dysuria, no frequency, no urgency, no nocturia. Musculoskeletal:No arthralgias, no back pain, no gait disturbance or myalgias. Neurological: No dizziness, no headaches, no numbness, no seizures, no syncope, no weakness, no tremors. Hematologic: No lymphadenopathy, no easy bruising. Psychiatric: No confusion, no hallucinations, no sleep disturbance.    Physical Exam: Filed Vitals:   09/20/12 0919  BP: 128/68  Pulse: 78   the general appearance reveals a well-developed well-nourished gentleman in no distress.The head and  neck exam reveals pupils equal and reactive.  Extraocular movements are full.  There is no scleral icterus.  The mouth and pharynx are normal.  The neck is supple.  The carotids reveal no bruits.  The jugular venous pressure is normal.  The  thyroid is not enlarged.  There is no lymphadenopathy.  The chest is clear to percussion and auscultation.  There are no rales or rhonchi.  Expansion of the chest is symmetrical.  The precordium is quiet.  The pulse is irregular .The first heart sound is normal.  The second heart sound is physiologically split.  There is no murmur gallop rub or  click.  There is no abnormal lift or heave.  The abdomen is soft and nontender.  The bowel sounds are normal.  The liver and spleen are not enlarged.  There are no abdominal masses.  There are no abdominal bruits.  Extremities reveal good pedal pulses.  There is no phlebitis or edema.  There is no cyanosis or clubbing.  Strength is normal and symmetrical in all extremities.  There is no lateralizing weakness.  There are no sensory deficits.  The skin is warm and dry.  There is no rash.   EKG shows atrial flutter with variable ventricular response and an average heart rate of 112.  Assessment / Plan: Continue Xarelto.  Increase diltiazem CD to 360 mg daily. Head Chantix to help with tobacco abuse. Recheck in 4 weeks for office visit and EKG

## 2012-09-28 ENCOUNTER — Ambulatory Visit: Payer: BC Managed Care – PPO | Admitting: Cardiology

## 2012-10-18 ENCOUNTER — Ambulatory Visit (INDEPENDENT_AMBULATORY_CARE_PROVIDER_SITE_OTHER): Payer: BC Managed Care – PPO | Admitting: Cardiology

## 2012-10-18 ENCOUNTER — Encounter: Payer: Self-pay | Admitting: Cardiology

## 2012-10-18 VITALS — BP 122/76 | HR 107 | Ht 70.0 in | Wt 255.4 lb

## 2012-10-18 DIAGNOSIS — Z720 Tobacco use: Secondary | ICD-10-CM

## 2012-10-18 DIAGNOSIS — I509 Heart failure, unspecified: Secondary | ICD-10-CM

## 2012-10-18 DIAGNOSIS — I4892 Unspecified atrial flutter: Secondary | ICD-10-CM

## 2012-10-18 DIAGNOSIS — I5030 Unspecified diastolic (congestive) heart failure: Secondary | ICD-10-CM

## 2012-10-18 DIAGNOSIS — F17200 Nicotine dependence, unspecified, uncomplicated: Secondary | ICD-10-CM

## 2012-10-18 MED ORDER — FUROSEMIDE 40 MG PO TABS: 40.0000 mg | ORAL_TABLET | Freq: Two times a day (BID) | ORAL | Status: DC

## 2012-10-18 NOTE — Patient Instructions (Signed)
INCREASE YOUR LASIX TO 40 MG TWICE A DAY  Your physician recommends that you schedule a follow-up appointment in: 2 month ov/ekg

## 2012-10-18 NOTE — Assessment & Plan Note (Signed)
The patient is tolerating Chantix without side effects.  His roommate is also on Chantix and they both plan to quit smoking altogether a week from Friday.

## 2012-10-18 NOTE — Assessment & Plan Note (Signed)
The patient has not been experiencing any symptoms of orthopnea or paroxysmal nocturnal dyspnea.  He does have mild ankle edema worse at times and we are increasing his furosemide so that he can take 40 mg twice a day.  His peripheral edema is improved if he puts support hose on first thing in the morning.  His weight is up 4 pounds since last visit which he attributes to the [place] weekend holiday and to not smoking.

## 2012-10-18 NOTE — Progress Notes (Signed)
Brian [name] Date of Birth:  05/17/1951 [facility] [address] [place], [place]  [address] [phone]         Fax   [phone]  History of Present Illness: This pleasant 62 year old gentleman is seen for a scheduled followup office visit. He was recently admitted on 09/04/12 and discharged on 09/05/12. He was admitted in atrial flutter and with acute diastolic CHF. Other diagnoses included tobacco abuse obesity and anxiety. In the hospital he converted on IV diltiazem and was able to be discharged the next day. The patient had an echocardiogram 09/05/12 which showed normal systolic function with an ejection fraction of 55-60%. He is initial chest x-ray showed pulmonary edema responded to IV Lasix and his weight on discharge was 253 pounds in the hospital.  Since discharge from the hospital the patient has gone back into atrial flutter with a controlled ventricular response.  Current Outpatient Prescriptions  Medication Sig Dispense Refill  . diltiazem (CARDIZEM CD) 360 MG 24 hr capsule Take 1 capsule (360 mg total) by mouth daily.  30 capsule  6  . furosemide (LASIX) 40 MG tablet Take 1 tablet (40 mg total) by mouth 2 (two) times daily.  60 tablet  5  . lisinopril (PRINIVIL,ZESTRIL) 5 MG tablet Take 1 tablet (5 mg total) by mouth daily.  30 tablet  6  . Rivaroxaban (XARELTO) 20 MG TABS Take 1 tablet (20 mg total) by mouth daily with supper.  30 tablet  6  . Sennosides (SENOKOT PO) Take by mouth daily.      . varenicline (CHANTIX STARTING MONTH PAK) 0.5 MG X 11 & 1 MG X 42 tablet Take one 0.5 mg tablet by mouth once daily for 3 days, then increase to one 0.5 mg tablet twice daily for 4 days, then increase to one 1 mg tablet twice daily.  53 tablet  0  . varenicline (CHANTIX) 1 MG tablet Take 1 tablet (1 mg total) by mouth 2 (two) times daily.  60 tablet  1  . vitamin B-12 (CYANOCOBALAMIN) 250 MCG tablet Take 250 mcg by mouth daily.      . vitamin C (ASCORBIC ACID)  500 MG tablet Take 500 mg by mouth 2 (two) times daily.       No current facility-administered medications for this visit.    No Known Allergies  Patient Active Problem List  Diagnosis  . Tobacco abuse  . Obesity  . Diastolic CHF  . Atrial flutter  . Anxiety  . Acute diastolic CHF (congestive heart failure)    History  Smoking status  . Current Every Day Smoker -- 1.00 packs/day for 50 years  . Types: Cigarettes  Smokeless tobacco  . Never Used    History  Alcohol Use  . Yes    Comment: socially    No family history on file.  Review of Systems: Constitutional: no fever chills diaphoresis or fatigue or change in weight.  Head and neck: no hearing loss, no epistaxis, no photophobia or visual disturbance. Respiratory: No cough, shortness of breath or wheezing. Cardiovascular: No chest pain peripheral edema, palpitations. Gastrointestinal: No abdominal distention, no abdominal pain, no change in bowel habits hematochezia or melena. Genitourinary: No dysuria, no frequency, no urgency, no nocturia. Musculoskeletal:No arthralgias, no back pain, no gait disturbance or myalgias. Neurological: No dizziness, no headaches, no numbness, no seizures, no syncope, no weakness, no tremors. Hematologic: No lymphadenopathy, no easy bruising. Psychiatric: No confusion, no hallucinations, no sleep disturbance.  Physical Exam: Filed Vitals:   10/18/12 1048  BP: 122/76  Pulse: 107   the general appearance reveals a well-developed slightly obese gentleman in no distress.The head and neck exam reveals pupils equal and reactive.  Extraocular movements are full.  There is no scleral icterus.  The mouth and pharynx are normal.  The neck is supple.  The carotids reveal no bruits.  The jugular venous pressure is normal.  The  thyroid is not enlarged.  There is no lymphadenopathy.  The chest is clear to percussion and auscultation.  There are no rales or rhonchi.  Expansion of the chest is  symmetrical.  The precordium is quiet.  The first heart sound is normal.  The second heart sound is physiologically split.  There is no murmur gallop rub or click.  There is no abnormal lift or heave.  The abdomen is soft and nontender.  The bowel sounds are normal.  The liver and spleen are not enlarged.  There are no abdominal masses.  There are no abdominal bruits.  Extremities reveal good pedal pulses.  There is no phlebitis or edema.  There is no cyanosis or clubbing.  Strength is normal and symmetrical in all extremities.  There is no lateralizing weakness.  There are no sensory deficits.  The skin is warm and dry.  There is no rash.  EKG shows atrial flutter with ventricular response of 106   Assessment / Plan: Continue same medication except increase furosemide up to 40 mg twice a day.  Recheck in 2 months for followup office visit and EKG.  Continue Xarelto to protect against stroke.

## 2012-10-18 NOTE — Assessment & Plan Note (Signed)
The patient is not aware of his irregular pulse and is not having any symptoms from the atrial flutter.  His heart rate is better controlled on the higher dose of diltiazem 360 mg daily

## 2012-11-28 ENCOUNTER — Other Ambulatory Visit: Payer: Self-pay | Admitting: *Deleted

## 2012-11-28 MED ORDER — LISINOPRIL 5 MG PO TABS: 5.0000 mg | ORAL_TABLET | Freq: Every day | ORAL | Status: DC

## 2012-11-28 NOTE — Telephone Encounter (Signed)
Pharmacy requesting 90 days. Fax Received. Refill Completed. Brian [name] (R.M.A)

## 2012-11-29 ENCOUNTER — Other Ambulatory Visit: Payer: Self-pay | Admitting: *Deleted

## 2012-11-29 MED ORDER — DILTIAZEM HCL ER COATED BEADS 360 MG PO CP24: 240.0000 mg | ORAL_CAPSULE | Freq: Every day | ORAL | Status: DC

## 2012-11-29 MED ORDER — FUROSEMIDE 40 MG PO TABS: 40.0000 mg | ORAL_TABLET | Freq: Two times a day (BID) | ORAL | Status: DC

## 2012-12-28 ENCOUNTER — Ambulatory Visit (INDEPENDENT_AMBULATORY_CARE_PROVIDER_SITE_OTHER): Payer: BC Managed Care – PPO | Admitting: Cardiology

## 2012-12-28 ENCOUNTER — Encounter: Payer: Self-pay | Admitting: Cardiology

## 2012-12-28 VITALS — BP 124/68 | HR 69 | Ht 70.0 in | Wt 264.6 lb

## 2012-12-28 DIAGNOSIS — I509 Heart failure, unspecified: Secondary | ICD-10-CM

## 2012-12-28 DIAGNOSIS — I4892 Unspecified atrial flutter: Secondary | ICD-10-CM

## 2012-12-28 DIAGNOSIS — I5031 Acute diastolic (congestive) heart failure: Secondary | ICD-10-CM

## 2012-12-28 DIAGNOSIS — Z720 Tobacco use: Secondary | ICD-10-CM

## 2012-12-28 DIAGNOSIS — F17200 Nicotine dependence, unspecified, uncomplicated: Secondary | ICD-10-CM

## 2012-12-28 NOTE — Assessment & Plan Note (Signed)
The patient has a history of paroxysmal atrial fibrillation and flutter.  Today he is back in normal sinus rhythm and feels well.  No chest pain or shortness of breath.  No TIA or stroke symptoms.  He is on Xarelto.

## 2012-12-28 NOTE — Assessment & Plan Note (Signed)
The patient is tolerating the Chantix without side effects

## 2012-12-28 NOTE — Assessment & Plan Note (Signed)
His weight is up 9 pounds since last visit but he is not having any symptoms of CHF.  He has trace pedal edema.  This is chronic.

## 2012-12-28 NOTE — Patient Instructions (Addendum)
Your physician recommends that you continue on your current medications as directed. Please refer to the Current Medication list given to you today.  Your physician wants you to follow-up in: 3 month ov You will receive a reminder letter in the mail two months in advance. If you don't receive a letter, please call our office to schedule the follow-up appointment.     

## 2012-12-28 NOTE — Progress Notes (Signed)
Brian [name] Date of Birth:  October 16, 1950 [facility] [address] [place], [place]  [address] [phone]         Fax   [phone]  History of Present Illness: This pleasant 62 year old gentleman is seen for a scheduled followup office visit. He was recently admitted on 09/04/12 and discharged on 09/05/12. He was admitted in atrial flutter and with acute diastolic CHF. Other diagnoses included tobacco abuse obesity and anxiety. In the hospital he converted on IV diltiazem and was able to be discharged the next day. The patient had an echocardiogram 09/05/12 which showed normal systolic function with an ejection fraction of 55-60%. He is initial chest x-ray showed pulmonary edema responded to IV Lasix and his weight on discharge was 253 pounds in the hospital. Since discharge from the hospital the patient went back into atrial flutter with a controlled ventricular response.  Today he returns and he is back in normal sinus rhythm.  The patient has a history of cigarette abuse.  He is on Chantix now.   Current Outpatient Prescriptions  Medication Sig Dispense Refill  . diltiazem (CARDIZEM CD) 360 MG 24 hr capsule Take 1 capsule (360 mg total) by mouth daily.  90 capsule  2  . furosemide (LASIX) 40 MG tablet Take 1 tablet (40 mg total) by mouth 2 (two) times daily.  180 tablet  2  . lisinopril (PRINIVIL,ZESTRIL) 5 MG tablet Take 1 tablet (5 mg total) by mouth daily.  90 tablet  3  . Rivaroxaban (XARELTO) 20 MG TABS Take 1 tablet (20 mg total) by mouth daily with supper.  30 tablet  6  . Sennosides (SENOKOT PO) Take by mouth daily.      . varenicline (CHANTIX STARTING MONTH PAK) 0.5 MG X 11 & 1 MG X 42 tablet Take one 0.5 mg tablet by mouth once daily for 3 days, then increase to one 0.5 mg tablet twice daily for 4 days, then increase to one 1 mg tablet twice daily.  53 tablet  0  . varenicline (CHANTIX) 1 MG tablet Take 1 tablet (1 mg total) by mouth 2 (two) times daily.  60  tablet  1  . vitamin B-12 (CYANOCOBALAMIN) 250 MCG tablet Take 250 mcg by mouth daily.      . vitamin C (ASCORBIC ACID) 500 MG tablet Take 500 mg by mouth 2 (two) times daily.       No current facility-administered medications for this visit.    No Known Allergies  Patient Active Problem List   Diagnosis Date Noted  . Acute diastolic CHF (congestive heart failure) 09/05/2012  . Tobacco abuse   . Obesity   . Diastolic CHF   . Atrial flutter   . Anxiety     History  Smoking status  . Former Smoker -- 1.00 packs/day for 50 years  . Types: Cigarettes  Smokeless tobacco  . Never Used    Comment: quit 11/04/12    History  Alcohol Use  . Yes    Comment: socially    No family history on file.  Review of Systems: Constitutional: no fever chills diaphoresis or fatigue or change in weight.  Head and neck: no hearing loss, no epistaxis, no photophobia or visual disturbance. Respiratory: No cough, shortness of breath or wheezing. Cardiovascular: No chest pain peripheral edema, palpitations. Gastrointestinal: No abdominal distention, no abdominal pain, no change in bowel habits hematochezia or melena. Genitourinary: No dysuria, no frequency, no urgency, no nocturia. Musculoskeletal:No arthralgias, no  back pain, no gait disturbance or myalgias. Neurological: No dizziness, no headaches, no numbness, no seizures, no syncope, no weakness, no tremors. Hematologic: No lymphadenopathy, no easy bruising. Psychiatric: No confusion, no hallucinations, no sleep disturbance.    Physical Exam: Filed Vitals:   12/28/12 0907  BP: 124/68  Pulse: 69   the general appearance reveals a well-developed obese gentleman in no distress.The head and neck exam reveals pupils equal and reactive.  Extraocular movements are full.  There is no scleral icterus.  The mouth and pharynx are normal.  The neck is supple.  The carotids reveal no bruits.  The jugular venous pressure is normal.  The  thyroid is not  enlarged.  There is no lymphadenopathy.  The chest is clear to percussion and auscultation.  There are no rales or rhonchi.  Expansion of the chest is symmetrical.  The precordium is quiet.  The first heart sound is normal.  The second heart sound is physiologically split.  There is no murmur gallop rub or click.  There is no abnormal lift or heave.  The abdomen is soft and nontender.  The bowel sounds are normal.  The liver and spleen are not enlarged.  There are no abdominal masses.  There are no abdominal bruits.  Extremities reveal good pedal pulses.  There is no phlebitis  and there is trace pretibial edema  There is no cyanosis or clubbing.  Strength is normal and symmetrical in all extremities.  There is no lateralizing weakness.  There are no sensory deficits.  The skin is warm and dry.  There is no rash.  EKG shows normal sinus rhythm and is within normal limits   Assessment / Plan: Continue same medication.  Recheck in 3 months for followup office visit EKG basal metabolic panel and CBC.  Work harder on weight loss.

## 2013-01-07 ENCOUNTER — Other Ambulatory Visit: Payer: Self-pay | Admitting: Cardiology

## 2013-01-07 DIAGNOSIS — Z720 Tobacco use: Secondary | ICD-10-CM

## 2013-03-29 ENCOUNTER — Encounter: Payer: Self-pay | Admitting: Cardiology

## 2013-03-29 ENCOUNTER — Ambulatory Visit (INDEPENDENT_AMBULATORY_CARE_PROVIDER_SITE_OTHER): Payer: BC Managed Care – PPO | Admitting: Cardiology

## 2013-03-29 ENCOUNTER — Telehealth: Payer: Self-pay | Admitting: *Deleted

## 2013-03-29 VITALS — BP 150/82 | HR 80 | Ht 70.0 in | Wt 258.0 lb

## 2013-03-29 DIAGNOSIS — I509 Heart failure, unspecified: Secondary | ICD-10-CM

## 2013-03-29 DIAGNOSIS — F17200 Nicotine dependence, unspecified, uncomplicated: Secondary | ICD-10-CM

## 2013-03-29 DIAGNOSIS — Z720 Tobacco use: Secondary | ICD-10-CM

## 2013-03-29 DIAGNOSIS — I5030 Unspecified diastolic (congestive) heart failure: Secondary | ICD-10-CM

## 2013-03-29 DIAGNOSIS — I4892 Unspecified atrial flutter: Secondary | ICD-10-CM

## 2013-03-29 LAB — CBC WITH DIFFERENTIAL/PLATELET
Basophils Relative: 0.5 % (ref 0.0–3.0)
Eosinophils Relative: 3.2 % (ref 0.0–5.0)
HCT: 42.9 % (ref 39.0–52.0)
Hemoglobin: 14.8 g/dL (ref 13.0–17.0)
Lymphs Abs: 1.8 10*3/uL (ref 0.7–4.0)
MCV: 88.5 fl (ref 78.0–100.0)
Monocytes Absolute: 0.8 10*3/uL (ref 0.1–1.0)
Neutro Abs: 7.5 10*3/uL (ref 1.4–7.7)
RBC: 4.84 Mil/uL (ref 4.22–5.81)
WBC: 10.4 10*3/uL (ref 4.5–10.5)

## 2013-03-29 LAB — BASIC METABOLIC PANEL
CO2: 29 mEq/L (ref 19–32)
Chloride: 101 mEq/L (ref 96–112)
Potassium: 5 mEq/L (ref 3.5–5.1)

## 2013-03-29 NOTE — Progress Notes (Signed)
Brian [name] Date of Birth:  10-09-1950 [facility] [address] [place], [place]  [address] [phone]         Fax   [phone]  History of Present Illness: This pleasant 62 year old gentleman is seen for a scheduled followup office visit. He was recently admitted on 09/04/12 and discharged on 09/05/12. He was admitted in atrial flutter and with acute diastolic CHF. Other diagnoses included tobacco abuse obesity and anxiety. In the hospital he converted on IV diltiazem and was able to be discharged the next day. The patient had an echocardiogram 09/05/12 which showed normal systolic function with an ejection fraction of 55-60%. He is initial chest x-ray showed pulmonary edema responded to IV Lasix and his weight on discharge was 253 pounds in the hospital. Since discharge from the hospital the patient went back into atrial flutter with a controlled ventricular response.  On his last visit in July he was back in normal sinus rhythm.  The patient has a history of cigarette abuse.    He is down to an occasional cigarette and is trying to quit.  He is no longer on Chantix.   Current Outpatient Prescriptions  Medication Sig Dispense Refill  . diltiazem (CARDIZEM CD) 360 MG 24 hr capsule Take 1 capsule (360 mg total) by mouth daily.  90 capsule  2  . furosemide (LASIX) 40 MG tablet Take 1 tablet (40 mg total) by mouth 2 (two) times daily.  180 tablet  2  . lisinopril (PRINIVIL,ZESTRIL) 5 MG tablet Take 1 tablet (5 mg total) by mouth daily.  90 tablet  3  . Rivaroxaban (XARELTO) 20 MG TABS Take 1 tablet (20 mg total) by mouth daily with supper.  30 tablet  6  . Sennosides (SENOKOT PO) Take by mouth daily.      . vitamin B-12 (CYANOCOBALAMIN) 250 MCG tablet Take 250 mcg by mouth daily.      . vitamin C (ASCORBIC ACID) 500 MG tablet Take 500 mg by mouth 2 (two) times daily.       No current facility-administered medications for this visit.    No Known Allergies  Patient  Active Problem List   Diagnosis Date Noted  . Acute diastolic CHF (congestive heart failure) 09/05/2012  . Tobacco abuse   . Obesity   . Diastolic CHF   . Atrial flutter   . Anxiety     History  Smoking status  . Former Smoker -- 1.00 packs/day for 50 years  . Types: Cigarettes  Smokeless tobacco  . Never Used    Comment: quit 11/04/12    History  Alcohol Use  . Yes    Comment: socially    No family history on file.  Review of Systems: Constitutional: no fever chills diaphoresis or fatigue or change in weight.  Head and neck: no hearing loss, no epistaxis, no photophobia or visual disturbance. Respiratory: No cough, shortness of breath or wheezing. Cardiovascular: No chest pain peripheral edema, palpitations. Gastrointestinal: No abdominal distention, no abdominal pain, no change in bowel habits hematochezia or melena. Genitourinary: No dysuria, no frequency, no urgency, no nocturia. Musculoskeletal:No arthralgias, no back pain, no gait disturbance or myalgias. Neurological: No dizziness, no headaches, no numbness, no seizures, no syncope, no weakness, no tremors. Hematologic: No lymphadenopathy, no easy bruising. Psychiatric: No confusion, no hallucinations, no sleep disturbance.    Physical Exam: Filed Vitals:   03/29/13 1018  BP: 150/82  Pulse: 80   the general appearance reveals a well-developed  obese gentleman in no distress.The head and neck exam reveals pupils equal and reactive.  Extraocular movements are full.  There is no scleral icterus.  The mouth and pharynx are normal.  The neck is supple.  The carotids reveal no bruits.  The jugular venous pressure is normal.  The  thyroid is not enlarged.  There is no lymphadenopathy.  The chest is clear to percussion and auscultation.  There are no rales or rhonchi.  Expansion of the chest is symmetrical.  The precordium is quiet.  The first heart sound is normal.  The second heart sound is physiologically split.  There is  no murmur gallop rub or click.  There is no abnormal lift or heave.  The abdomen is soft and nontender.  The bowel sounds are normal.  The liver and spleen are not enlarged.  There are no abdominal masses.  There are no abdominal bruits.  Extremities reveal good pedal pulses.  There is no phlebitis  and there is trace pretibial edema  There is no cyanosis or clubbing.  Strength is normal and symmetrical in all extremities.  There is no lateralizing weakness.  There are no sensory deficits.  The skin is warm and dry.  There is no rash.   Assessment / Plan: His weight is down 6 pounds since last visit.  He is to continue same medication.  We are checking a CBC and a basal metabolic panel today.  He'll return at six-month intervals and we will get an EKG at his next visit.

## 2013-03-29 NOTE — Assessment & Plan Note (Signed)
The patient smokes only an occasional cigarette a day now.  Denies cough or sputum production.

## 2013-03-29 NOTE — Telephone Encounter (Signed)
Advised patient of lab results  

## 2013-03-29 NOTE — Assessment & Plan Note (Signed)
The patient has not had a recurrence of his atrial flutter that he has been aware of

## 2013-03-29 NOTE — Assessment & Plan Note (Signed)
The patient is not having symptoms of congestive heart failure

## 2013-03-29 NOTE — Progress Notes (Signed)
Quick Note:  Please report to patient. The recent labs are stable. Continue same medication and careful diet. ______ 

## 2013-03-29 NOTE — Telephone Encounter (Signed)
Message copied by Burnell Blanks on Wed Mar 29, 2013  2:50 PM ------      Message from: Cassell Clement      Created: Wed Mar 29, 2013  2:34 PM       Please report to patient.  The recent labs are stable. Continue same medication and careful diet. ------

## 2013-03-29 NOTE — Patient Instructions (Addendum)
Will obtain labs today and call you with the results (bmet/cbc)  Your physician recommends that you continue on your current medications as directed. Please refer to the Current Medication list given to you today  Your physician wants you to follow-up in: 6 month ov/ekg You will receive a reminder letter in the mail two months in advance. If you don't receive a letter, please call our office to schedule the follow-up appointment.

## 2013-06-08 ENCOUNTER — Other Ambulatory Visit: Payer: Self-pay | Admitting: Nurse Practitioner

## 2013-11-16 ENCOUNTER — Emergency Department (HOSPITAL_COMMUNITY): Payer: BC Managed Care – PPO

## 2013-11-16 ENCOUNTER — Inpatient Hospital Stay (HOSPITAL_COMMUNITY)
Admission: EM | Admit: 2013-11-16 | Discharge: 2013-11-18 | DRG: 308 | Disposition: A | Discharge status: Home / Self Care | Payer: BC Managed Care – PPO | Attending: Cardiovascular Disease | Admitting: Cardiovascular Disease

## 2013-11-16 ENCOUNTER — Encounter (HOSPITAL_COMMUNITY): Payer: Self-pay | Admitting: Emergency Medicine

## 2013-11-16 DIAGNOSIS — F3289 Other specified depressive episodes: Secondary | ICD-10-CM | POA: Diagnosis present

## 2013-11-16 DIAGNOSIS — Z6835 Body mass index (BMI) 35.0-35.9, adult: Secondary | ICD-10-CM

## 2013-11-16 DIAGNOSIS — I509 Heart failure, unspecified: Secondary | ICD-10-CM

## 2013-11-16 DIAGNOSIS — F17200 Nicotine dependence, unspecified, uncomplicated: Secondary | ICD-10-CM | POA: Diagnosis present

## 2013-11-16 DIAGNOSIS — E66812 Obesity, class 2: Secondary | ICD-10-CM | POA: Diagnosis present

## 2013-11-16 DIAGNOSIS — I5030 Unspecified diastolic (congestive) heart failure: Secondary | ICD-10-CM | POA: Diagnosis present

## 2013-11-16 DIAGNOSIS — I5031 Acute diastolic (congestive) heart failure: Secondary | ICD-10-CM

## 2013-11-16 DIAGNOSIS — F329 Major depressive disorder, single episode, unspecified: Secondary | ICD-10-CM | POA: Diagnosis present

## 2013-11-16 DIAGNOSIS — Z720 Tobacco use: Secondary | ICD-10-CM

## 2013-11-16 DIAGNOSIS — F411 Generalized anxiety disorder: Secondary | ICD-10-CM | POA: Diagnosis present

## 2013-11-16 DIAGNOSIS — I4892 Unspecified atrial flutter: Principal | ICD-10-CM | POA: Diagnosis present

## 2013-11-16 DIAGNOSIS — F419 Anxiety disorder, unspecified: Secondary | ICD-10-CM

## 2013-11-16 DIAGNOSIS — Z91199 Patient's noncompliance with other medical treatment and regimen due to unspecified reason: Secondary | ICD-10-CM

## 2013-11-16 DIAGNOSIS — I4891 Unspecified atrial fibrillation: Secondary | ICD-10-CM | POA: Diagnosis present

## 2013-11-16 DIAGNOSIS — I5033 Acute on chronic diastolic (congestive) heart failure: Secondary | ICD-10-CM | POA: Diagnosis present

## 2013-11-16 DIAGNOSIS — Z9119 Patient's noncompliance with other medical treatment and regimen: Secondary | ICD-10-CM

## 2013-11-16 DIAGNOSIS — I5032 Chronic diastolic (congestive) heart failure: Secondary | ICD-10-CM

## 2013-11-16 DIAGNOSIS — E669 Obesity, unspecified: Secondary | ICD-10-CM | POA: Diagnosis present

## 2013-11-16 HISTORY — DX: Shortness of breath: R06.02

## 2013-11-16 LAB — CBC WITH DIFFERENTIAL/PLATELET
Basophils Absolute: 0 K/uL (ref 0.0–0.1)
Basophils Relative: 0 % (ref 0–1)
Eosinophils Absolute: 0.1 K/uL (ref 0.0–0.7)
Eosinophils Relative: 1 % (ref 0–5)
HCT: 39.1 % (ref 39.0–52.0)
Hemoglobin: 12.8 g/dL — ABNORMAL LOW (ref 13.0–17.0)
Lymphocytes Relative: 9 % — ABNORMAL LOW (ref 12–46)
Lymphs Abs: 1.2 K/uL (ref 0.7–4.0)
MCH: 30.8 pg (ref 26.0–34.0)
MCHC: 32.7 g/dL (ref 30.0–36.0)
MCV: 94.2 fL (ref 78.0–100.0)
Monocytes Absolute: 1.4 K/uL — ABNORMAL HIGH (ref 0.1–1.0)
Monocytes Relative: 11 % (ref 3–12)
Neutro Abs: 9.7 K/uL — ABNORMAL HIGH (ref 1.7–7.7)
Neutrophils Relative %: 79 % — ABNORMAL HIGH (ref 43–77)
Platelets: 203 K/uL (ref 150–400)
RBC: 4.15 MIL/uL — ABNORMAL LOW (ref 4.22–5.81)
RDW: 12.9 % (ref 11.5–15.5)
WBC: 12.4 K/uL — ABNORMAL HIGH (ref 4.0–10.5)

## 2013-11-16 LAB — PRO B NATRIURETIC PEPTIDE: Pro B Natriuretic peptide (BNP): 235.9 pg/mL — ABNORMAL HIGH (ref 0–125)

## 2013-11-16 LAB — I-STAT CHEM 8, ED
BUN: 10 mg/dL (ref 6–23)
Calcium, Ion: 1.14 mmol/L (ref 1.13–1.30)
Chloride: 97 meq/L (ref 96–112)
Creatinine, Ser: 0.9 mg/dL (ref 0.50–1.35)
Glucose, Bld: 109 mg/dL — ABNORMAL HIGH (ref 70–99)
HCT: 43 % (ref 39.0–52.0)
Hemoglobin: 14.6 g/dL (ref 13.0–17.0)
Potassium: 3.9 meq/L (ref 3.7–5.3)
Sodium: 142 meq/L (ref 137–147)
TCO2: 29 mmol/L (ref 0–100)

## 2013-11-16 LAB — I-STAT TROPONIN, ED: Troponin i, poc: 0 ng/mL (ref 0.00–0.08)

## 2013-11-16 MED ORDER — DILTIAZEM HCL 25 MG/5ML IV SOLN
10.0000 mg | Freq: Once | INTRAVENOUS | Status: AC
  Administered 2013-11-16: 10 mg via INTRAVENOUS
  Filled 2013-11-16: qty 5

## 2013-11-16 MED ORDER — LISINOPRIL 5 MG PO TABS
5.0000 mg | ORAL_TABLET | Freq: Every day | ORAL | Status: DC
  Administered 2013-11-16 – 2013-11-18 (×3): 5 mg via ORAL
  Filled 2013-11-16 (×3): qty 1

## 2013-11-16 MED ORDER — RIVAROXABAN 20 MG PO TABS
20.0000 mg | ORAL_TABLET | Freq: Every day | ORAL | Status: DC
  Administered 2013-11-16 – 2013-11-17 (×2): 20 mg via ORAL
  Filled 2013-11-16 (×3): qty 1

## 2013-11-16 MED ORDER — ASPIRIN 81 MG PO CHEW
81.0000 mg | CHEWABLE_TABLET | Freq: Every day | ORAL | Status: DC
  Administered 2013-11-16 – 2013-11-18 (×3): 81 mg via ORAL
  Filled 2013-11-16 (×2): qty 1

## 2013-11-16 MED ORDER — DILTIAZEM HCL ER COATED BEADS 360 MG PO CP24
360.0000 mg | ORAL_CAPSULE | Freq: Every day | ORAL | Status: DC
  Administered 2013-11-16 – 2013-11-18 (×3): 360 mg via ORAL
  Filled 2013-11-16 (×3): qty 1

## 2013-11-16 MED ORDER — FUROSEMIDE 10 MG/ML IJ SOLN
80.0000 mg | Freq: Once | INTRAMUSCULAR | Status: AC
  Administered 2013-11-16: 80 mg via INTRAVENOUS
  Filled 2013-11-16: qty 8

## 2013-11-16 MED ORDER — FUROSEMIDE 10 MG/ML IJ SOLN
40.0000 mg | Freq: Two times a day (BID) | INTRAMUSCULAR | Status: DC
  Administered 2013-11-17 – 2013-11-18 (×3): 40 mg via INTRAVENOUS
  Filled 2013-11-16 (×6): qty 4

## 2013-11-16 MED ORDER — DILTIAZEM LOAD VIA INFUSION
15.0000 mg | Freq: Once | INTRAVENOUS | Status: AC
  Administered 2013-11-16: 15 mg via INTRAVENOUS
  Filled 2013-11-16: qty 15

## 2013-11-16 MED ORDER — DILTIAZEM HCL 100 MG IV SOLR
5.0000 mg/h | INTRAVENOUS | Status: DC
  Administered 2013-11-16: 5 mg/h via INTRAVENOUS
  Administered 2013-11-16 – 2013-11-17 (×2): 10 mg/h via INTRAVENOUS
  Filled 2013-11-16 (×2): qty 100

## 2013-11-16 NOTE — H&P (Signed)
Chief Complaint:  Dyspnea at rest  HPI:  This is a 63 y.o. male with a past medical history significant for diastolic heart failure associated with atrial flutter with rapid ventricular rate for which has required hospitalization in the past. He has been noncompliant with that quite ill and (Xarelto) " he doesn't like the television commercial" that describes potential for intracranial hemorrhage. He has also been noncompliant with diltiazem, without a clear reason. It does appear that he has been taking aspirin and lisinopril. Although he does not cook with added salt, his preferred food is hot dogs.  He has noticed a roughly 14 pound weight gain and persistent edema. When compared with his weight at his last visit with Dr. Mare Ferrari he has actually gained 17 pounds. Usually his ankle swelling occurs after standing up for many hours and resolves by the late afternoon when he gets his feet up. It is now present when he wakes up in the morning He has orthopnea and wheezing. He denies any problems with chest pain, dizziness, palpitations or syncope.  He successfully quit smoking using Chantix. His son continues to smoke and shares the household with him, but is also trying to quit.  PMHx:  Past Medical History  Diagnosis Date  . Anxiety   . Atrial flutter     a. 08/2012  . Diastolic CHF     a. 02/4853, in setting of aflutter;  b. 08/2012 Echo: EF 55-60%.  . Obesity   . Tobacco abuse     History reviewed. No pertinent past surgical history.  FAMHx:  Negative for premature cardiac disease or other inheritable disorders  SOCHx:   reports that he has quit smoking. His smoking use included Cigarettes. He has a 50 pack-year smoking history. He has never used smokeless tobacco. He reports that he drinks alcohol. He reports that he does not use illicit drugs.  ALLERGIES:  No Known Allergies  ROS: Constitutional: positive for Weight gain, negative for anorexia, chills and fevers Eyes:  negative Ears, nose, mouth, throat, and face: Dry mouth Respiratory: positive for cough, dyspnea on exertion and wheezing, negative for hemoptysis, pleurisy/chest pain and sputum Cardiovascular: positive for lower extremity edema and orthopnea, negative for chest pressure/discomfort, claudication and palpitations Gastrointestinal: positive for Abdominal distention, negative for abdominal pain, constipation, diarrhea, jaundice, melena, nausea and vomiting Genitourinary:negative Integument/breast: negative for rash and skin lesion(s) Hematologic/lymphatic: negative for bleeding and easy bruising Musculoskeletal:positive for arthralgias Neurological: negative for coordination problems, dizziness, gait problems, headaches, speech problems and weakness Behavioral/Psych: negative Endocrine: negative Allergic/Immunologic: negative  HOME MEDS:  (Not in a hospital admission)  LABS/IMAGING: Results for orders placed during the hospital encounter of 11/16/13 (from the past 48 hour(s))  CBC WITH DIFFERENTIAL     Status: Abnormal   Collection Time    11/16/13 11:48 AM      Result Value Ref Range   WBC 12.4 (*) 4.0 - 10.5 K/uL   RBC 4.15 (*) 4.22 - 5.81 MIL/uL   Hemoglobin 12.8 (*) 13.0 - 17.0 g/dL   HCT 39.1  39.0 - 52.0 %   MCV 94.2  78.0 - 100.0 fL   MCH 30.8  26.0 - 34.0 pg   MCHC 32.7  30.0 - 36.0 g/dL   RDW 12.9  11.5 - 15.5 %   Platelets 203  150 - 400 K/uL   Neutrophils Relative % 79 (*) 43 - 77 %   Neutro Abs 9.7 (*) 1.7 - 7.7 K/uL   Lymphocytes Relative 9 (*)  12 - 46 %   Lymphs Abs 1.2  0.7 - 4.0 K/uL   Monocytes Relative 11  3 - 12 %   Monocytes Absolute 1.4 (*) 0.1 - 1.0 K/uL   Eosinophils Relative 1  0 - 5 %   Eosinophils Absolute 0.1  0.0 - 0.7 K/uL   Basophils Relative 0  0 - 1 %   Basophils Absolute 0.0  0.0 - 0.1 K/uL  PRO B NATRIURETIC PEPTIDE     Status: Abnormal   Collection Time    11/16/13 11:48 AM      Result Value Ref Range   Pro B Natriuretic peptide (BNP)  235.9 (*) 0 - 125 pg/mL  I-STAT TROPOININ, ED     Status: None   Collection Time    11/16/13 12:01 PM      Result Value Ref Range   Troponin i, poc 0.00  0.00 - 0.08 ng/mL   Comment 3            Comment: Due to the release kinetics of cTnI,     a negative result within the first hours     of the onset of symptoms does not rule out     myocardial infarction with certainty.     If myocardial infarction is still suspected,     repeat the test at appropriate intervals.  I-STAT CHEM 8, ED     Status: Abnormal   Collection Time    11/16/13 12:02 PM      Result Value Ref Range   Sodium 142  137 - 147 mEq/L   Potassium 3.9  3.7 - 5.3 mEq/L   Chloride 97  96 - 112 mEq/L   BUN 10  6 - 23 mg/dL   Creatinine, Ser 0.90  0.50 - 1.35 mg/dL   Glucose, Bld 109 (*) 70 - 99 mg/dL   Calcium, Ion 1.14  1.13 - 1.30 mmol/L   TCO2 29  0 - 100 mmol/L   Hemoglobin 14.6  13.0 - 17.0 g/dL   HCT 43.0  39.0 - 52.0 %   Dg Chest Port 1 View  11/16/2013   CLINICAL DATA:  CHF.  EXAM: PORTABLE CHEST - 1 VIEW  COMPARISON:  09/05/2012  FINDINGS: The heart is enlarged. There is vascular congestion probable interstitial edema suggesting CHF. No large pleural effusions. Exam limited by body habitus.  IMPRESSION: Cardiac enlargement with vascular congestion and probable interstitial edema. No definite pleural effusions.   Electronically Signed   By: Kalman Jewels M.D.   On: 11/16/2013 12:54    VITALS: Blood pressure 118/66, pulse 77, temperature 98.3 F (36.8 C), resp. rate 24, weight 270 lb (122.471 kg), SpO2 92.00%.  EXAM:  General: Alert, oriented x3, no distress Head: no evidence of trauma, PERRL, EOMI, no exophtalmos or lid lag, no myxedema, no xanthelasma; normal ears, nose and oropharynx Neck: 10 cm elevation in jugular venous pulsations and prompt hepatojugular reflux; brisk carotid pulses without delay and no carotid bruits Chest: clear to auscultation, no signs of consolidation by percussion or  palpation, normal fremitus, symmetrical and full respiratory excursions Cardiovascular: normal position and quality of the apical impulse, regular rhythm, normal first heart sound and normal second heart sounds, no rubs or gallops, no murmur Abdomen: no tenderness or distention, no masses by palpation, no abnormal pulsatility or arterial bruits, normal bowel sounds, no hepatosplenomegaly Extremities: no clubbing, cyanosis or edema; 2+ radial, ulnar and brachial pulses bilaterally; 2+ right femoral, posterior tibial and dorsalis pedis pulses;  2+ left femoral, posterior tibial and dorsalis pedis pulses; no subclavian or femoral bruits Neurological: grossly nonfocal  ECHO 08/2012 Left ventricle: The cavity size was normal. Wall thickness was normal. Systolic function was normal. The estimated ejection fraction was in the range of 55% to 60%. Left ventricular diastolic function parameters were normal. Left atrium - AP dim 34 mm     IMPRESSION: Acute exacerbation of chronic diastolic heart failure due to atrial flutter with rapid ventricular response, likely secondary to noncompliance with rate control medication. Noncompliance with anticoagulation  with rapid ventricular response  PLAN: Intravenous furosemide Target approximately 14 pound weight loss Intravenous diltiazem infusion until we can restart oral diltiazem Repeat echocardiogram to make sure there is no evidence of tachycardia induced cardiomyopathy or other secondary causes of decompensation Not a good candidate for early cardioversion secondary to noncompliance with medications. He prefers not to have a transesophageal echocardiogram. Aggressive attempts at restoration of normal rhythm are likely not necessary since he has done well in the past simply with rate control.  Sanda Klein, MD, [facility] [phone] office [phone] pager  11/16/2013, 1:22 PM

## 2013-11-16 NOTE — ED Provider Notes (Signed)
CSN: [id number]     Arrival date & time 11/16/13  1130 History   First MD Initiated Contact with Patient 11/16/13 1141     Chief Complaint  Patient presents with  . Chest Pain  . Shortness of Breath     (Consider location/radiation/quality/duration/timing/severity/associated sxs/prior Treatment) The history is provided by the patient.   patient presents with shortness of breath and some chest pain. He's been feeling bad for around 3 days, but now feels like he has been in A. fib for the last day and a half. He states he feels heart going fast. He states the chest pain is somewhat dull. He states he's had some swelling in his legs but is not that change. He states he has been more short of breath and has been unable to lay flat.  Past Medical History  Diagnosis Date  . Anxiety   . Atrial flutter     a. 08/2012  . Diastolic CHF     a. 12/5100, in setting of aflutter;  b. 08/2012 Echo: EF 55-60%.  . Obesity   . Tobacco abuse    History reviewed. No pertinent past surgical history. No family history on file. History  Substance Use Topics  . Smoking status: Former Smoker -- 1.00 packs/day for 50 years    Types: Cigarettes  . Smokeless tobacco: Never Used     Comment: quit 11/04/12  . Alcohol Use: Yes     Comment: socially    Review of Systems  Constitutional: Negative for activity change and appetite change.  Eyes: Negative for pain.  Respiratory: Positive for cough and shortness of breath. Negative for chest tightness.   Cardiovascular: Positive for chest pain and leg swelling.  Gastrointestinal: Negative for nausea, vomiting, abdominal pain and diarrhea.  Genitourinary: Negative for flank pain.  Musculoskeletal: Negative for back pain and neck stiffness.  Skin: Negative for rash.  Neurological: Negative for weakness, numbness and headaches.  Psychiatric/Behavioral: Negative for behavioral problems.      Allergies  Review of patient's allergies indicates no known  allergies.  Home Medications   Prior to Admission medications   Medication Sig Start Date End Date Taking? Authorizing Provider  aspirin 81 MG chewable tablet Chew 81 mg by mouth daily.   Yes Historical Provider, MD  diltiazem (CARDIZEM CD) 360 MG 24 hr capsule Take 360 mg by mouth daily.   Yes Historical Provider, MD  furosemide (LASIX) 40 MG tablet Take 40 mg by mouth 2 (two) times daily.   Yes Historical Provider, MD  lisinopril (PRINIVIL,ZESTRIL) 5 MG tablet Take 5 mg by mouth daily.   Yes Historical Provider, MD  rivaroxaban (XARELTO) 20 MG TABS tablet Take 20 mg by mouth daily with supper.   Yes Historical Provider, MD  Sennosides (SENOKOT PO) Take by mouth daily.    Historical Provider, MD  vitamin B-12 (CYANOCOBALAMIN) 250 MCG tablet Take 250 mcg by mouth daily.    Historical Provider, MD  vitamin C (ASCORBIC ACID) 500 MG tablet Take 500 mg by mouth 2 (two) times daily.    Historical Provider, MD   BP 145/82  Pulse 72  Temp(Src) 98.3 F (36.8 C)  Resp 23  Wt 270 lb (122.471 kg)  SpO2 91% Physical Exam  Nursing note and vitals reviewed. Constitutional: He is oriented to person, place, and time. He appears well-developed and well-nourished.  HENT:  Head: Normocephalic and atraumatic.  Eyes: EOM are normal. Pupils are equal, round, and reactive to light.  Neck: Normal range of  motion. Neck supple.  Cardiovascular: Normal heart sounds.   No murmur heard. Regular tachycardia  Pulmonary/Chest: Effort normal.  Rales bilateral bases  Abdominal: Soft. Bowel sounds are normal. He exhibits no distension and no mass. There is no tenderness. There is no rebound and no guarding.  Musculoskeletal: Normal range of motion. He exhibits edema.  Edema to bilateral lower extremities  Neurological: He is alert and oriented to person, place, and time. No cranial nerve deficit.  Skin: Skin is warm and dry.  Psychiatric: He has a normal mood and affect.    ED Course  Procedures (including  critical care time) Labs Review Labs Reviewed  CBC WITH DIFFERENTIAL - Abnormal; Notable for the following:    WBC 12.4 (*)    RBC 4.15 (*)    Hemoglobin 12.8 (*)    Neutrophils Relative % 79 (*)    Neutro Abs 9.7 (*)    Lymphocytes Relative 9 (*)    Monocytes Absolute 1.4 (*)    All other components within normal limits  PRO B NATRIURETIC PEPTIDE - Abnormal; Notable for the following:    Pro B Natriuretic peptide (BNP) 235.9 (*)    All other components within normal limits  I-STAT CHEM 8, ED - Abnormal; Notable for the following:    Glucose, Bld 109 (*)    All other components within normal limits  Randolm Idol, ED    Imaging Review Dg Chest Port 1 View  11/16/2013   CLINICAL DATA:  CHF.  EXAM: PORTABLE CHEST - 1 VIEW  COMPARISON:  09/05/2012  FINDINGS: The heart is enlarged. There is vascular congestion probable interstitial edema suggesting CHF. No large pleural effusions. Exam limited by body habitus.  IMPRESSION: Cardiac enlargement with vascular congestion and probable interstitial edema. No definite pleural effusions.   Electronically Signed   By: Kalman Jewels M.D.   On: 11/16/2013 12:54     EKG Interpretation   Date/Time:  Thursday Nov 16 2013 11:47:20 EDT Ventricular Rate:  156 PR Interval:  108 QRS Duration: 118 QT Interval:  275 QTC Calculation: 443 R Axis:   106 Text Interpretation:  Right and left arm electrode reversal,  interpretation assumes no reversal Atrial flutter with 2 to 1 block  Nonspecific intraventricular conduction delay Nonspecific T abnormalities,  inferior leads ST elevation, consider inferior injury Artifact in lead(s)  I II aVR Confirmed by Alvino Chapel  MD, Ovid Curd [phone]) on 11/16/2013 1:04:13  PM      MDM   Final diagnoses:  Acute diastolic CHF (congestive heart failure)  Atrial flutter    Patient with shortness of breath and fatigue. Found to be atrial flutter with RVR. Has a history of same. His been noncompliant with some of  his medications, including several to. Also found to be in CHF, likely secondary to the atrial flutter. Patient has been started on Cardizem drip. Will be admitted to cardiology.  CRITICAL CARE Performed by: Jasper Riling. Caliann Leckrone Total critical care time: 30 Critical care time was exclusive of separately billable procedures and treating other patients. Critical care was necessary to treat or prevent imminent or life-threatening deterioration. Critical care was time spent personally by me on the following activities: development of treatment plan with patient and/or surrogate as well as nursing, discussions with consultants, evaluation of patient's response to treatment, examination of patient, obtaining history from patient or surrogate, ordering and performing treatments and interventions, ordering and review of laboratory studies, ordering and review of radiographic studies, pulse oximetry and re-evaluation of patient's condition.  Jasper Riling. Alvino Chapel, MD 11/16/13 1504

## 2013-11-16 NOTE — ED Notes (Addendum)
Cp and sob x 3 days nausea in the am pain comes and goes gets weak and dizzy w/ pain

## 2013-11-16 NOTE — ED Notes (Signed)
MD at bedside. 

## 2013-11-17 DIAGNOSIS — Z91199 Patient's noncompliance with other medical treatment and regimen due to unspecified reason: Secondary | ICD-10-CM

## 2013-11-17 DIAGNOSIS — I5033 Acute on chronic diastolic (congestive) heart failure: Secondary | ICD-10-CM

## 2013-11-17 DIAGNOSIS — F17200 Nicotine dependence, unspecified, uncomplicated: Secondary | ICD-10-CM

## 2013-11-17 DIAGNOSIS — I517 Cardiomegaly: Secondary | ICD-10-CM

## 2013-11-17 DIAGNOSIS — Z9119 Patient's noncompliance with other medical treatment and regimen: Secondary | ICD-10-CM

## 2013-11-17 LAB — CBC WITH DIFFERENTIAL/PLATELET
BASOS ABS: 0 10*3/uL (ref 0.0–0.1)
Basophils Relative: 0 % (ref 0–1)
EOS PCT: 3 % (ref 0–5)
Eosinophils Absolute: 0.3 10*3/uL (ref 0.0–0.7)
HEMATOCRIT: 38.9 % — AB (ref 39.0–52.0)
Hemoglobin: 12.7 g/dL — ABNORMAL LOW (ref 13.0–17.0)
LYMPHS PCT: 14 % (ref 12–46)
Lymphs Abs: 1.6 10*3/uL (ref 0.7–4.0)
MCH: 30.8 pg (ref 26.0–34.0)
MCHC: 32.6 g/dL (ref 30.0–36.0)
MCV: 94.2 fL (ref 78.0–100.0)
Monocytes Absolute: 1.5 10*3/uL — ABNORMAL HIGH (ref 0.1–1.0)
Monocytes Relative: 13 % — ABNORMAL HIGH (ref 3–12)
Neutro Abs: 7.9 10*3/uL — ABNORMAL HIGH (ref 1.7–7.7)
Neutrophils Relative %: 70 % (ref 43–77)
Platelets: 208 10*3/uL (ref 150–400)
RBC: 4.13 MIL/uL — ABNORMAL LOW (ref 4.22–5.81)
RDW: 13 % (ref 11.5–15.5)
WBC: 11.3 10*3/uL — ABNORMAL HIGH (ref 4.0–10.5)

## 2013-11-17 LAB — COMPREHENSIVE METABOLIC PANEL
ALBUMIN: 3.3 g/dL — AB (ref 3.5–5.2)
ALK PHOS: 59 U/L (ref 39–117)
ALT: 37 U/L (ref 0–53)
AST: 22 U/L (ref 0–37)
BUN: 16 mg/dL (ref 6–23)
CO2: 33 mEq/L — ABNORMAL HIGH (ref 19–32)
Calcium: 9 mg/dL (ref 8.4–10.5)
Chloride: 96 mEq/L (ref 96–112)
Creatinine, Ser: 0.95 mg/dL (ref 0.50–1.35)
GFR calc Af Amer: >90 mL/min (ref >90)
GFR calc non Af Amer: 87 mL/min — ABNORMAL LOW (ref >90)
Glucose, Bld: 133 mg/dL — ABNORMAL HIGH (ref 70–99)
POTASSIUM: 4.7 meq/L (ref 3.7–5.3)
SODIUM: 138 meq/L (ref 137–147)
TOTAL PROTEIN: 7 g/dL (ref 6.0–8.3)
Total Bilirubin: 0.7 mg/dL (ref 0.3–1.2)

## 2013-11-17 LAB — PRO B NATRIURETIC PEPTIDE: Pro B Natriuretic peptide (BNP): 289.8 pg/mL — ABNORMAL HIGH (ref 0–125)

## 2013-11-17 NOTE — Progress Notes (Signed)
Please report. Echo is normal EF. Continue same meds.

## 2013-11-17 NOTE — Progress Notes (Signed)
  Echocardiogram 2D Echocardiogram has been performed.  Basilia Jumbo 11/17/2013, 10:07 AM

## 2013-11-17 NOTE — Care Management Note (Unsigned)
    Page 1 of 1   11/17/2013     3:00:49 PM CARE MANAGEMENT NOTE 11/17/2013  Patient:  Brian [name], Brian [name]   Account Number:  [id number]  Date Initiated:  11/17/2013  Documentation initiated by:  Ruthvik Barnaby  Subjective/Objective Assessment:   Pt adm on 11/16/13 with Afib with RVR.  PTA, pt independent of ADLS.     Action/Plan:   Will follow for dc needs as pt progresses   Anticipated DC Date:  11/19/2013   Anticipated DC Plan:  [place]  CM consult      Choice offered to / List presented to:             Status of service:  In process, will continue to follow Medicare Important Message given?   (If response is "NO", the following Medicare IM given date fields will be blank) Date Medicare IM given:   Date Additional Medicare IM given:    Discharge Disposition:    Per UR Regulation:  Reviewed for med. necessity/level of care/duration of stay  If discussed at [place] of Stay Meetings, dates discussed:    Comments:

## 2013-11-17 NOTE — Progress Notes (Signed)
Utilization review completed. Johnetta Sloniker, RN, BSN. 

## 2013-11-17 NOTE — Progress Notes (Signed)
Subjective: No complaints at this time.    Objective: Vital signs in last 24 hours: Temp:  [98 F (36.7 C)-98.4 F (36.9 C)] 98.4 F (36.9 C) (05/22 0409) Pulse Rate:  [57-159] 74 (05/22 0409) Resp:  [17-29] 20 (05/22 0409) BP: (104-162)/(56-98) 122/62 mmHg (05/22 1027) SpO2:  [90 %-99 %] 99 % (05/22 0409) Weight:  [262 lb 3.2 oz (118.933 kg)-270 lb (122.471 kg)] 262 lb 5.6 oz (119 kg) (05/22 0409) Last BM Date: 11/14/13  Intake/Output from previous day: 05/21 0701 - 05/22 0700 In: 16 [P.O.:16] Out: 2200 [Urine:2200] Intake/Output this shift: Total I/O In: 240 [P.O.:240] Out: -   Medications Current Facility-Administered Medications  Medication Dose Route Frequency Provider Last Rate Last Dose  . aspirin chewable tablet 81 mg  81 mg Oral Daily Mihai Croitoru, MD   81 mg at 11/17/13 1027  . diltiazem (CARDIZEM CD) 24 hr capsule 360 mg  360 mg Oral Daily Mihai Croitoru, MD   360 mg at 11/17/13 1027  . diltiazem (CARDIZEM) 100 mg in dextrose 5 % 100 mL infusion  5-15 mg/hr Intravenous Titrated Mihai Croitoru, MD 10 mL/hr at 11/17/13 0615 10 mg/hr at 11/17/13 0615  . furosemide (LASIX) injection 40 mg  40 mg Intravenous Q12H Mihai Croitoru, MD   40 mg at 11/17/13 0113  . lisinopril (PRINIVIL,ZESTRIL) tablet 5 mg  5 mg Oral Daily Mihai Croitoru, MD   5 mg at 11/17/13 1027  . rivaroxaban (XARELTO) tablet 20 mg  20 mg Oral Q supper Sanda Klein, MD   20 mg at 11/16/13 1711    PE: General appearance: alert, cooperative and no distress Neck: no JVD Lungs: Decreased BS bilaterally.  No wheezing Heart: irregularly irregular rhythm and No MM Abdomen: +BS, Nontender Extremities: 1+ LEE Pulses: 2+ and symmetric Skin: Warm and dry Neurologic: Grossly normal  Lab Results:   Recent Labs  11/16/13 1148 11/16/13 1202 11/17/13 0320  WBC 12.4*  --  11.3*  HGB 12.8* 14.6 12.7*  HCT 39.1 43.0 38.9*  PLT 203  --  208   BMET  Recent Labs  11/16/13 1202 11/17/13 0320    NA 142 138  K 3.9 4.7  CL 97 96  CO2  --  33*  GLUCOSE 109* 133*  BUN 10 16  CREATININE 0.90 0.95  CALCIUM  --  9.0     Assessment/Plan  63 y.o. male with a past medical history significant for diastolic heart failure associated with atrial flutter with rapid ventricular rate for which has required hospitalization in the past. He has been noncompliant with that quite ill and (Xarelto) " he doesn't like the television commercial" that describes potential for intracranial hemorrhage. He has also been noncompliant with diltiazem, without a clear reason. It does appear that he has been taking aspirin and lisinopril. Although he does not cook with added salt, his preferred food is hot dogs.   He has noticed a roughly 14 pound weight gain and persistent edema. When compared with his weight at his last visit with Dr. Mare Ferrari he has actually gained 17 pounds. Usually his ankle swelling occurs after standing up for many hours and resolves by the late afternoon when he gets his feet up. It is now present when he wakes up in the morning He has orthopnea and wheezing. He denies any problems with chest pain, dizziness, palpitations or syncope.   He successfully quit smoking using Chantix. His son continues to smoke and shares the household with him, but is  also trying to quit.  Principal Problem:   Acute diastolic CHF (congestive heart failure) Net fluids:  -2.2L on IV lasix 40mg  Q12.  On ACE -I.  He looks well compensated.  Some mild LEE.  CXR yesterday looked wet.  He slept with bed elevated last night.  Recommend one more night.  SCr stable.  He really want to go home so I'm not sure home honest he is being.  Compare to weight a year ago and today, he is up 9 pounds.        Atrial flutter with rapid ventricular response Rate well controlled now.  Changed to PO cardizem 360 daily.   On Xarelto.  He says he will talk it.  Tobacco abuse  He is smoking again    Obesity   LOS: 1 day    Tarri Fuller PA-C 11/17/2013 11:28 AM  I have examined the patient and reviewed assessment and plan and discussed with patient.  Agree with above as stated.  Patient had a good diuresis yesterday. He would benefit from more IV Lasix. I explained this to him and he was visibly upset. He was shouting in the room that he "did not like to be told what to do." It was his body and life and he can do what he wanted. I told him we could not force him to stay but he stated his sister would make him stay if that is what was recommended. He still has some lower extremity edema.  Hopefully, he can be discharged tomorrow. He states he will be more compliant.  Jettie Booze

## 2013-11-17 NOTE — Discharge Instructions (Signed)
Information on my medicine - XARELTO (Rivaroxaban)  This medication education was reviewed with me or my healthcare representative as part of my discharge preparation.  The pharmacist that spoke with me during my hospital stay was:  Wayland Salinas, [facility]  Why was Xarelto prescribed for you? Xarelto was prescribed for you to reduce the risk of a blood clot forming that can cause a stroke if you have a medical condition called atrial fibrillation (a type of irregular heartbeat).  What do you need to know about xarelto ? Take your Xarelto ONCE DAILY at the same time every day with your evening meal. If you have difficulty swallowing the tablet whole, you may crush it and mix in applesauce just prior to taking your dose.  Take Xarelto exactly as prescribed by your doctor and DO NOT stop taking Xarelto without talking to the doctor who prescribed the medication.  Stopping without other stroke prevention medication to take the place of Xarelto may increase your risk of developing a clot that causes a stroke.  Refill your prescription before you run out.  After discharge, you should have regular check-up appointments with your healthcare provider that is prescribing your Xarelto.  In the future your dose may need to be changed if your kidney function or weight changes by a significant amount.  What do you do if you miss a dose? If you are taking Xarelto ONCE DAILY and you miss a dose, take it as soon as you remember on the same day then continue your regularly scheduled once daily regimen the next day. Do not take two doses of Xarelto at the same time or on the same day.   Important Safety Information A possible side effect of Xarelto is bleeding. You should call your healthcare provider right away if you experience any of the following:   Bleeding from an injury or your nose that does not stop.   Unusual colored urine (red or dark brown) or unusual colored stools (red or  black).   Unusual bruising for unknown reasons.   A serious fall or if you hit your head (even if there is no bleeding).  Some medicines may interact with Xarelto and might increase your risk of bleeding while on Xarelto. To help avoid this, consult your healthcare provider or pharmacist prior to using any new prescription or non-prescription medications, including herbals, vitamins, non-steroidal anti-inflammatory drugs (NSAIDs) and supplements.  This website has more information on Xarelto: [email or web address]

## 2013-11-18 ENCOUNTER — Other Ambulatory Visit: Payer: Self-pay | Admitting: Nurse Practitioner

## 2013-11-18 DIAGNOSIS — I5030 Unspecified diastolic (congestive) heart failure: Secondary | ICD-10-CM

## 2013-11-18 DIAGNOSIS — I5031 Acute diastolic (congestive) heart failure: Secondary | ICD-10-CM

## 2013-11-18 LAB — CBC
HEMATOCRIT: 41.4 % (ref 39.0–52.0)
Hemoglobin: 13.3 g/dL (ref 13.0–17.0)
MCH: 30.5 pg (ref 26.0–34.0)
MCHC: 32.1 g/dL (ref 30.0–36.0)
MCV: 95 fL (ref 78.0–100.0)
Platelets: 242 10*3/uL (ref 150–400)
RBC: 4.36 MIL/uL (ref 4.22–5.81)
RDW: 12.8 % (ref 11.5–15.5)
WBC: 10 10*3/uL (ref 4.0–10.5)

## 2013-11-18 LAB — CREATININE, SERUM
Creatinine, Ser: 0.78 mg/dL (ref 0.50–1.35)
GFR calc Af Amer: >90 mL/min (ref >90)

## 2013-11-18 LAB — BASIC METABOLIC PANEL
BUN: 19 mg/dL (ref 6–23)
CHLORIDE: 96 meq/L (ref 96–112)
CO2: 33 mEq/L — ABNORMAL HIGH (ref 19–32)
Calcium: 9.1 mg/dL (ref 8.4–10.5)
Creatinine, Ser: 0.89 mg/dL (ref 0.50–1.35)
GFR calc non Af Amer: 90 mL/min — ABNORMAL LOW (ref >90)
Glucose, Bld: 118 mg/dL — ABNORMAL HIGH (ref 70–99)
Potassium: 4.4 mEq/L (ref 3.7–5.3)
Sodium: 137 mEq/L (ref 137–147)

## 2013-11-18 MED ORDER — FUROSEMIDE 40 MG PO TABS: 40.0000 mg | ORAL_TABLET | Freq: Two times a day (BID) | ORAL | Status: DC

## 2013-11-18 MED ORDER — RIVAROXABAN 20 MG PO TABS: 20.0000 mg | ORAL_TABLET | Freq: Every day | ORAL | Status: DC

## 2013-11-18 MED ORDER — ENOXAPARIN SODIUM 30 MG/0.3ML ~~LOC~~ SOLN
30.0000 mg | SUBCUTANEOUS | Status: DC
  Filled 2013-11-18 (×2): qty 0.3

## 2013-11-18 MED ORDER — FUROSEMIDE 40 MG PO TABS
40.0000 mg | ORAL_TABLET | Freq: Two times a day (BID) | ORAL | Status: DC
  Administered 2013-11-18: 40 mg via ORAL
  Filled 2013-11-18 (×3): qty 1

## 2013-11-18 MED ORDER — DILTIAZEM HCL ER COATED BEADS 360 MG PO CP24: 240.0000 mg | ORAL_CAPSULE | Freq: Every day | ORAL | Status: DC

## 2013-11-18 NOTE — Progress Notes (Signed)
Consulting cardiologist: Irish Lack, MD Primary Cardiologist: Darlin Coco MD  Subjective:   Insists on going home. Will not stay another day. Admits to not taking lasix as directed and may not when released.   Objective:   Temp:  [97.7 F (36.5 C)-98.2 F (36.8 C)] 97.7 F (36.5 C) (05/23 0422) Pulse Rate:  [67-77] 67 (05/23 0422) Resp:  [18-20] 20 (05/23 0422) BP: (112-122)/(57-69) 112/57 mmHg (05/23 0422) SpO2:  [94 %-96 %] 96 % (05/23 0422) Weight:  [261 lb 8 oz (118.616 kg)] 261 lb 8 oz (118.616 kg) (05/23 0424) Last BM Date: 11/14/13  Filed Weights   11/16/13 1949 11/17/13 0409 11/18/13 0424  Weight: 262 lb 3.2 oz (118.933 kg) 262 lb 5.6 oz (119 kg) 261 lb 8 oz (118.616 kg)    Intake/Output Summary (Last 24 hours) at 11/18/13 0736 Last data filed at 11/18/13 0300  Gross per 24 hour  Intake    240 ml  Output   2400 ml  Net  -2160 ml    Telemetry: NSR rates in the 80's.  Exam:  General: No acute distress.  HEENT: Conjunctiva and lids normal, oropharynx clear.  Lungs: Bilateral crackles in the bases. No wheezes.   Cardiac: No elevated JVP or bruits. RRR, no gallop or rub.   Abdomen: Normoactive bowel sounds, abdomen mildly distended, nontender, nondistended.  Extremities: No pitting edema, distal pulses full.  Neuropsychiatric: Alert and oriented x3, affect appropriate.   Lab Results:  Basic Metabolic Panel:  Recent Labs Lab 11/16/13 1202 11/17/13 0320 11/18/13 0255  NA 142 138 137  K 3.9 4.7 4.4  CL 97 96 96  CO2  --  33* 33*  GLUCOSE 109* 133* 118*  BUN 10 16 19   CREATININE 0.90 0.95 0.89  CALCIUM  --  9.0 9.1    Liver Function Tests:  Recent Labs Lab 11/17/13 0320  AST 22  ALT 37  ALKPHOS 59  BILITOT 0.7  PROT 7.0  ALBUMIN 3.3*    CBC:  Recent Labs Lab 11/16/13 1148 11/16/13 1202 11/17/13 0320  WBC 12.4*  --  11.3*  HGB 12.8* 14.6 12.7*  HCT 39.1 43.0 38.9*  MCV 94.2  --  94.2  PLT 203  --  208    BNP:  Recent Labs  11/16/13 1148 11/17/13 0320  PROBNP 235.9* 289.8*   Radiology: Dg Chest Port 1 View  11/16/2013   CLINICAL DATA:  CHF.  EXAM: PORTABLE CHEST - 1 VIEW  COMPARISON:  09/05/2012  FINDINGS: The heart is enlarged. There is vascular congestion probable interstitial edema suggesting CHF. No large pleural effusions. Exam limited by body habitus.  IMPRESSION: Cardiac enlargement with vascular congestion and probable interstitial edema. No definite pleural effusions.   Electronically Signed   By: Kalman Jewels M.D.   On: 11/16/2013 12:54   Echocardiogram 11/17/2013 Left ventricle: The cavity size was normal. Wall thickness was increased in a pattern of mild LVH. Systolic function was normal. The estimated ejection fraction was in the range of 50% to 55%. Although no diagnostic regional wall motion abnormality was identified, this possibility cannot be completely excluded on the basis of this study. Doppler parameters are consistent with abnormal left ventricular relaxation (grade 1 diastolic dysfunction). - Left atrium: The atrium was moderately dilated. - Pulmonary arteries: PA peak pressure: 32 mm Hg (S).   Medications:   Scheduled Medications: . aspirin  81 mg Oral Daily  . diltiazem  360 mg Oral Daily  . enoxaparin (LOVENOX) injection  30  mg Subcutaneous Q24H  . furosemide  40 mg Intravenous Q12H  . lisinopril  5 mg Oral Daily  . rivaroxaban  20 mg Oral Q supper    Infusions: . diltiazem (CARDIZEM) infusion Stopped (11/17/13 1129)     Assessment and Plan:   1. Acute on Chronic Diastolic CHF: He admits to dietary and medical non-compliance. He states that he was not taking lasix BID as directed and notes state he was eating salty foods. He has diuresed 2.5 liters since admission with improvement in his LEE, and breathing status, but has continued abdominal distention. He says his dry wt is 256 lbs. He is 261 lbs this am. Will transition him back to po as he  insists on going home today. He is counseled on low sodium diet., but he states he will probably eat what he likes.   He is not at baseline weight and needs further diureses but he refuses to stay longer. Recommend AMA discharge.   2. Atrial fib with RVR: Now back on po diltiazem. He admits to not taking Xarelto as directed. I have counseled him on the need to take meds as directed as he is of high risk for CVA. CHADS Score of 2.   3.Tobacco abuse: Stopped smoking this year..   4. Anxiety: Talks openly about family issues and problems that cause him to not want to take his medications. Recommend OP counseling at PCP discretion.   Phill Myron. Purcell Nails NP [facility] 11/18/2013, 7:36 AM  Agree with Note by Jory Sims PA-C  Pt is adamant about being D/Cd. He was admitted for AF with RVR and volume overload CHF (DD). Diuresed 4 liters. Converted back to NSR. Discussed the importance of medication compliance. He still has some scattered basilar crackles and trace edema and optimally needs 1-2 more days of diuresis but he agrees to close OP ROV with MLP then ROV with DR. Brackbill.  Lorretta Harp, M.D., [place], [facility], [place] [address] [place], [place]  [id number]  [phone] 11/18/2013 8:42 AM

## 2013-11-18 NOTE — Discharge Summary (Signed)
Physician Discharge Summary  Patient ID: Brian [name] MRN: [id number] DOB/AGE: 1951-06-15 63 y.o.  Admit date: 11/16/2013 Discharge date: 11/18/2013  Primary Discharge Diagnosis 1. Atrial fib with RVR 2. Acute on Chronic Diastolic CHF  Secondary Discharge Diagnosis 1.Anxiety 2. Depression 3. Obesity  Primary Cardiologist: [place]  Significant Diagnostic Studies:  1.Echocardiogram Left ventricle: The cavity size was normal. Wall thickness was increased in a pattern of mild LVH. Systolic function was normal. The estimated ejection fraction was in the range of 50% to 55%. Although no diagnostic regional wall motion abnormality was identified, this possibility cannot be completely excluded on the basis of this study. Doppler parameters are consistent with abnormal left ventricular relaxation (grade 1 diastolic dysfunction). - Left atrium: The atrium was moderately dilated. - Pulmonary arteries: PA peak pressure: 32 mm Hg (S).   Hospital Course:      63 year old patient of Dr. Mare Ferrari who is admitted with diastolic CHF, atrial flutter with rapid ventricular rate, with history of medical noncompliance concerning Xarelto, and Lasix. Along with dietary noncompliance. The patient is a very anxious man who is concerned about side effects of medicine from watching television concerning Xarelto, and had stopped taking Lasix as his family member had gone into renal failure with use of diuretics. The patient has not had salt but he prefers hot dogs and other fast foods. The patient began approximately 14 pounds, with lower extremity edema. He states part of the weight gain was related to smoking cessation. He complained of wheezing and orthopnea.  The patient was given IV diuretics with Lasix 40 mg twice a day, placed on a diltiazem drip for heart rate control. The patient diuresed some but not adequately, despite IV diuretics. Echocardiogram was completed revealing normal ejection  fraction with an EF 55% with grade 1 diastolic dysfunction. The patient has only lost approximately 3 pounds since admission.  The patient has been belligerent and demanding since admission. He is also very anxious and emotional. He refused to stay longer than 2 days. He states that he promises to take his medications as directed his lungs prescriptions are provided. He is also advised on a low sodium diet. He is not promising he will adhere to that. Dr. Alvester Chou myself approximately concerning his need to be compliant to prevent recurrent hospitalizations. He verbalizes understanding, but also becomes very upset discussing family member issues and need to get home.  A close followup appointment will be made in Dr. Sherryl Barters office within one week for ongoing assessment of his current status. He will need constant reinforcement of medical compliance and dietary compliance. Baseline wt is 158 lbs discharge wt 262 lbs.     Discharge Exam: Blood pressure 112/57, pulse 67, temperature 97.7 F (36.5 C), temperature source Oral, resp. rate 20, height 6' (1.829 m), weight 261 lb 8 oz (118.616 kg), SpO2 96.00%.   Labs:   Lab Results  Component Value Date   WBC 11.3* 11/17/2013   HGB 12.7* 11/17/2013   HCT 38.9* 11/17/2013   MCV 94.2 11/17/2013   PLT 208 11/17/2013     Recent Labs Lab 11/17/13 0320 11/18/13 0255  NA 138 137  K 4.7 4.4  CL 96 96  CO2 33* 33*  BUN 16 19  CREATININE 0.95 0.89  CALCIUM 9.0 9.1  PROT 7.0  --   BILITOT 0.7  --   ALKPHOS 59  --   ALT 37  --   AST 22  --   GLUCOSE 133* 118*  No results found for this basename: CKTOTAL,  CKMB,  CKMBINDEX,  TROPONINI    Lab Results  Component Value Date   CHOL 143 09/05/2012   Lab Results  Component Value Date   HDL 40 09/05/2012   Lab Results  Component Value Date   LDLCALC 62 09/05/2012   Lab Results  Component Value Date   TRIG 203* 09/05/2012   Lab Results  Component Value Date   CHOLHDL 3.6 09/05/2012   No  results found for this basename: LDLDIRECT      Radiology: Dg Chest Port 1 View  11/16/2013   CLINICAL DATA:  CHF.  EXAM: PORTABLE CHEST - 1 VIEW  COMPARISON:  09/05/2012  FINDINGS: The heart is enlarged. There is vascular congestion probable interstitial edema suggesting CHF. No large pleural effusions. Exam limited by body habitus.  IMPRESSION: Cardiac enlargement with vascular congestion and probable interstitial edema. No definite pleural effusions.   Electronically Signed   By: Kalman Jewels M.D.   On: 11/16/2013 12:54    FOLLOW UP PLANS AND APPOINTMENTS     Discharge Instructions   Diet - low sodium heart healthy    Complete by:  As directed      Increase activity slowly    Complete by:  As directed             Medication List         aspirin 81 MG chewable tablet  Chew 81 mg by mouth daily.     diltiazem 360 MG 24 hr capsule  Commonly known as:  CARDIZEM CD  Take 1 capsule (360 mg total) by mouth daily.     furosemide 40 MG tablet  Commonly known as:  LASIX  Take 1 tablet (40 mg total) by mouth 2 (two) times daily.     lisinopril 5 MG tablet  Commonly known as:  PRINIVIL,ZESTRIL  Take 5 mg by mouth daily.     rivaroxaban 20 MG Tabs tablet  Commonly known as:  XARELTO  Take 1 tablet (20 mg total) by mouth daily with supper.     sennosides-docusate sodium 8.6-50 MG tablet  Commonly known as:  SENOKOT-S  Take 1 tablet by mouth daily.     vitamin B-12 250 MCG tablet  Commonly known as:  CYANOCOBALAMIN  Take 250 mcg by mouth daily.     vitamin C 500 MG tablet  Commonly known as:  ASCORBIC ACID  Take 500 mg by mouth 2 (two) times daily.       Follow-up Information   Follow up with Darlin Coco, MD. (Or Physician assistant. Our office will call you for appt this week.)    Specialty:  Cardiology   Contact information:   [address] [place] [address] [phone]         Time spent with patient to include physician time:60  minutes   Signed: Lendon Colonel 11/18/2013, 8:58 AM Co-Sign MD

## 2013-11-22 ENCOUNTER — Encounter: Payer: Self-pay | Admitting: Physician Assistant

## 2013-11-22 ENCOUNTER — Other Ambulatory Visit: Payer: Self-pay | Admitting: Nurse Practitioner

## 2013-11-22 ENCOUNTER — Ambulatory Visit (INDEPENDENT_AMBULATORY_CARE_PROVIDER_SITE_OTHER): Payer: BC Managed Care – PPO | Admitting: Physician Assistant

## 2013-11-22 VITALS — BP 130/76 | HR 77 | Ht 72.0 in | Wt 254.0 lb

## 2013-11-22 DIAGNOSIS — I5033 Acute on chronic diastolic (congestive) heart failure: Secondary | ICD-10-CM | POA: Insufficient documentation

## 2013-11-22 DIAGNOSIS — I10 Essential (primary) hypertension: Secondary | ICD-10-CM

## 2013-11-22 DIAGNOSIS — I4892 Unspecified atrial flutter: Secondary | ICD-10-CM

## 2013-11-22 DIAGNOSIS — I5032 Chronic diastolic (congestive) heart failure: Secondary | ICD-10-CM

## 2013-11-22 NOTE — Patient Instructions (Signed)
Your physician recommends that you schedule a follow-up appointment in: 02/26/14 8 AM WITH DR. Mare Ferrari   LAB WORK TODAY; BMET

## 2013-11-22 NOTE — Progress Notes (Signed)
Cardiology Office Note   Date:  11/22/2013   ID:  Brian, [name] 01-08-1951, MRN [id number]  PCP:  Eulas Post, MD  Cardiologist:  Dr. Darlin Coco      History of Present Illness: Brian [name] is a 63 y.o. male with a hx of diastolic CHF assoc with AFlutter with RVR.  He was admitted 2/83[birth year]1/[birth year] with a/c diastolic CHF in the setting of AFlutter with RVR.  He had been compliant with medications.  He agreed to resume Xarelto and Diltiazem.  He was diuresed.  He returns for follow up.  He is doing well.  The patient denies chest pain, shortness of breath, syncope, orthopnea, PND or significant pedal edema.  His R arm is somewhat sore after his IV was taken out in the hospital.  No fevers, chills, redness, swelling.   Studies:  - Echo (11/17/13):  Mild LVH, EF 76-16%, grade 1 diastolic dysfunction, moderate LAE, PASP 32 mm Hg   Recent Labs: 11/17/2013: ALT 37; Pro B Natriuretic peptide (BNP) 289.8*  11/18/2013: Creatinine 0.78; Hemoglobin 13.3; Potassium 4.4   Wt Readings from Last 3 Encounters:  11/18/13 261 lb 8 oz (118.616 kg)  03/29/13 258 lb (117.028 kg)  12/28/12 264 lb 9.6 oz (120.022 kg)     Past Medical History  Diagnosis Date  . Anxiety   . Atrial flutter     a. 08/2012  . Diastolic CHF     a. 0/7371, in setting of aflutter;  b. 08/2012 Echo: EF 55-60%.  . Obesity   . Tobacco abuse   . Shortness of breath     Current Outpatient Prescriptions  Medication Sig Dispense Refill  . aspirin 81 MG chewable tablet Chew 81 mg by mouth daily.      Marland Kitchen diltiazem (CARDIZEM CD) 360 MG 24 hr capsule Take 1 capsule (360 mg total) by mouth daily.  30 capsule  10  . furosemide (LASIX) 40 MG tablet Take 1 tablet (40 mg total) by mouth 2 (two) times daily.  60 tablet  10  . lisinopril (PRINIVIL,ZESTRIL) 5 MG tablet Take 5 mg by mouth daily.      . rivaroxaban (XARELTO) 20 MG TABS tablet Take 1 tablet (20 mg total) by mouth daily with supper.  30 tablet  10  .  sennosides-docusate sodium (SENOKOT-S) 8.6-50 MG tablet Take 1 tablet by mouth daily.      . vitamin B-12 (CYANOCOBALAMIN) 250 MCG tablet Take 250 mcg by mouth daily.      . vitamin C (ASCORBIC ACID) 500 MG tablet Take 500 mg by mouth 2 (two) times daily.       No current facility-administered medications for this visit.    Allergies:   Review of patient's allergies indicates no known allergies.   Social History:  The patient  reports that he has quit smoking. His smoking use included Cigarettes. He has a 50 pack-year smoking history. He has never used smokeless tobacco. He reports that he drinks alcohol. He reports that he does not use illicit drugs.   Family History:  The patient's family history is not on file.   ROS:  Please see the history of present illness.  No bleeding problems.   All other systems reviewed and negative.   PHYSICAL EXAM: VS:  BP 130/76  Pulse 77  Ht 6' (1.829 m)  Wt 254 lb (115.214 kg)  BMI 34.44 kg/m2 Well nourished, well developed, in no acute distress HEENT: normal Neck: no JVD Cardiac:  normal S1,  S2; RRR; no murmur Lungs:  clear to auscultation bilaterally, no wheezing, rhonchi or rales Abd: soft, nontender, no hepatomegaly Ext: trace bilateral LE edema; R arm without redness, swelling or tenderness Skin: warm and dry Neuro:  CNs 2-12 intact, no focal abnormalities noted  EKG:  NSR, HR 77, normal axis, no ST changes     ASSESSMENT AND PLAN:  1. Chronic diastolic heart failure:  Volume stable. Continue current Rx. Check BMET today.   2. Atrial flutter:  Maintaining NSR.  Continue Xarelto (CHADS2-VASc=2 - HTN, CHF).  Continue Diltiazem.   3. HTN (hypertension):  Controlled. 4. Right Arm Pain:  No signs of infection of swelling.  Continue to monitor for now.  5. Disposition:  F/u with Dr. Darlin Coco in 3 mos.    Signed, Versie Starks, MHS 11/22/2013 3:43 PM    [facility] [address], [place], [place]   [address] Phone: [phone]; Fax: [phone]

## 2013-11-23 LAB — BASIC METABOLIC PANEL
BUN: 24 mg/dL — ABNORMAL HIGH (ref 6–23)
CHLORIDE: 97 meq/L (ref 96–112)
CO2: 30 meq/L (ref 19–32)
CREATININE: 1.1 mg/dL (ref 0.4–1.5)
Calcium: 9.6 mg/dL (ref 8.4–10.5)
GFR: 70.46 mL/min (ref >60.00)
GLUCOSE: 93 mg/dL (ref 70–99)
Potassium: 5 mEq/L (ref 3.5–5.1)
Sodium: 137 mEq/L (ref 135–145)

## 2013-11-24 ENCOUNTER — Telehealth: Payer: Self-pay | Admitting: *Deleted

## 2013-11-24 NOTE — Telephone Encounter (Signed)
pt notfied of lab results with verbal understanding

## 2014-02-26 ENCOUNTER — Ambulatory Visit (INDEPENDENT_AMBULATORY_CARE_PROVIDER_SITE_OTHER): Payer: BC Managed Care – PPO | Admitting: Cardiology

## 2014-02-26 ENCOUNTER — Encounter: Payer: Self-pay | Admitting: Cardiology

## 2014-02-26 VITALS — BP 110/70 | HR 82 | Ht 72.0 in | Wt 264.0 lb

## 2014-02-26 DIAGNOSIS — I4892 Unspecified atrial flutter: Secondary | ICD-10-CM

## 2014-02-26 DIAGNOSIS — I10 Essential (primary) hypertension: Secondary | ICD-10-CM

## 2014-02-26 DIAGNOSIS — I5032 Chronic diastolic (congestive) heart failure: Secondary | ICD-10-CM

## 2014-02-26 NOTE — Patient Instructions (Signed)
STOP ASPIRIN 70 MG   Your physician wants you to follow-up in: [place] will receive a reminder letter in the mail two months in advance. If you don't receive a letter, please call our office to schedule the follow-up appointment.

## 2014-02-26 NOTE — Progress Notes (Signed)
Brian [name] Date of Birth:  [birth year] [place] [address] [place], [place]  [address] [phone]        Fax   [phone]   History of Present Illness: This pleasant 63 year old gentleman is seen for a scheduled followup office visit. He was originally admitted on 09/04/12 and discharged on 09/05/12. He was admitted in atrial flutter and with acute diastolic CHF. Other diagnoses included tobacco abuse, obesity and anxiety. In the hospital he converted on IV diltiazem and was able to be discharged the next day.  The patient was readmitted in May 2015 with acute diastolic heart failure and atrial flutter with rapid ventricular response.  Echocardiogram on 11/17/2013 showed; - Left ventricle: The cavity size was normal. Wall thickness was increased in a pattern of mild LVH. Systolic function was normal. The estimated ejection fraction was in the range of 50% to 55%. Although no diagnostic regional wall motion abnormality was identified, this possibility cannot be completely excluded on the basis of this study. Doppler parameters are consistent with abnormal left ventricular relaxation (grade 1 diastolic dysfunction). - Left atrium: The atrium was moderately dilated. - Pulmonary arteries: PA peak pressure: 32 mm Hg (S). Since discharge from the hospital the patient has been feeling well.  No chest pain or shortness of breath.  He has not been aware of his heart racing or skipping.   Current Outpatient Prescriptions  Medication Sig Dispense Refill  . aspirin 81 MG chewable tablet Chew 81 mg by mouth daily.      Marland Kitchen diltiazem (CARDIZEM CD) 360 MG 24 hr capsule Take 1 capsule (360 mg total) by mouth daily.  30 capsule  10  . furosemide (LASIX) 40 MG tablet Take 1 tablet (40 mg total) by mouth 2 (two) times daily.  60 tablet  10  . lisinopril (PRINIVIL,ZESTRIL) 5 MG tablet Take 5 mg by mouth daily.      Marland Kitchen OVER THE COUNTER MEDICATION rexall stool softener        . vitamin B-12 (CYANOCOBALAMIN) 250 MCG tablet Take 250 mcg by mouth daily.      . vitamin C (ASCORBIC ACID) 500 MG tablet Take 500 mg by mouth 2 (two) times daily.      Alveda Reasons 20 MG TABS tablet TAKE 1 TABLET EVERY DAY WITH SUPPER  30 tablet  5   No current facility-administered medications for this visit.    No Known Allergies  Patient Active Problem List   Diagnosis Date Noted  . HTN (hypertension) 11/22/2013  . Chronic diastolic heart failure 77/93/9030  . Noncompliance 11/17/2013  . Atrial flutter with rapid ventricular response 11/16/2013  . Acute on chronic diastolic CHF (congestive heart failure) 09/05/2012  . Tobacco abuse   . Obesity- BMI 35   . Diastolic CHF   . Atrial flutter   . Anxiety     History  Smoking status  . Former Smoker -- 1.00 packs/day for 50 years  . Types: Cigarettes  Smokeless tobacco  . Never Used    Comment: quit 11/04/12    History  Alcohol Use  . Yes    Comment: socially    No family history on file.  Review of Systems: Constitutional: no fever chills diaphoresis or fatigue or change in weight.  Head and neck: no hearing loss, no epistaxis, no photophobia or visual disturbance. Respiratory: No cough, shortness of breath or wheezing. Cardiovascular: No chest pain peripheral edema, palpitations. Gastrointestinal: No abdominal distention, no abdominal  pain, no change in bowel habits hematochezia or melena. Genitourinary: No dysuria, no frequency, no urgency, no nocturia. Musculoskeletal:No arthralgias, no back pain, no gait disturbance or myalgias. Neurological: No dizziness, no headaches, no numbness, no seizures, no syncope, no weakness, no tremors. Hematologic: No lymphadenopathy, no easy bruising. Psychiatric: No confusion, no hallucinations, no sleep disturbance.    Physical Exam: Filed Vitals:   02/26/14 0756  BP: 110/70  Pulse: 82   general appearance reveals a large gentleman in no acute distress.The patient appears to  be in no distress.  Head and neck exam reveals that the pupils are equal and reactive.  The extraocular movements are full.  There is no scleral icterus.  Mouth and pharynx are benign.  No lymphadenopathy.  No carotid bruits.  The jugular venous pressure is normal.  Thyroid is not enlarged or tender.  Chest is clear to percussion and auscultation.  No rales or rhonchi.  Expansion of the chest is symmetrical.  Heart reveals no abnormal lift or heave.  First and second heart sounds are normal.  There is no murmur gallop rub or click.  The pulse is irregularly irregular in atrial flutter. The abdomen is soft and nontender.  Bowel sounds are normoactive.  There is no hepatosplenomegaly or mass.  There are no abdominal bruits.  Extremities reveal no phlebitis or edema.  Pedal pulses are good.  There is no cyanosis or clubbing.  Neurologic exam is normal strength and no lateralizing weakness.  No sensory deficits.  Integument reveals no rash  EKG shows atrial flutter with controlled ventricular response.  Assessment / Plan:  1. Chronic diastolic heart failure: Volume stable. Continue current Rx.  2. Atrial flutter: Maintaining NSR. Continue Xarelto (CHADS2-VASc=2 - HTN, CHF). Continue Diltiazem.  3. HTN (hypertension): Controlled. 4. tobacco abuse.  Patient states that he quit smoking after his last hospitalization in May 2015.  Since he quit smoking he has gained weight.  Plan: Continue same medication.  Stop baby aspirin. Try to lose weight.  Recheck in 6 months for office visit and EKG

## 2014-04-17 ENCOUNTER — Emergency Department (HOSPITAL_COMMUNITY): Payer: BC Managed Care – PPO

## 2014-04-17 ENCOUNTER — Encounter (HOSPITAL_COMMUNITY): Payer: Self-pay | Admitting: Emergency Medicine

## 2014-04-17 ENCOUNTER — Emergency Department (HOSPITAL_COMMUNITY)
Admission: EM | Admit: 2014-04-17 | Discharge: 2014-04-17 | Disposition: A | Discharge status: Home / Self Care | Payer: BC Managed Care – PPO | Attending: Emergency Medicine | Admitting: Emergency Medicine

## 2014-04-17 DIAGNOSIS — R1011 Right upper quadrant pain: Secondary | ICD-10-CM | POA: Insufficient documentation

## 2014-04-17 DIAGNOSIS — R9389 Abnormal findings on diagnostic imaging of other specified body structures: Secondary | ICD-10-CM

## 2014-04-17 DIAGNOSIS — Y92410 Unspecified street and highway as the place of occurrence of the external cause: Secondary | ICD-10-CM | POA: Diagnosis not present

## 2014-04-17 DIAGNOSIS — R918 Other nonspecific abnormal finding of lung field: Secondary | ICD-10-CM | POA: Diagnosis not present

## 2014-04-17 DIAGNOSIS — S3991XA Unspecified injury of abdomen, initial encounter: Secondary | ICD-10-CM | POA: Diagnosis not present

## 2014-04-17 DIAGNOSIS — Z79899 Other long term (current) drug therapy: Secondary | ICD-10-CM | POA: Insufficient documentation

## 2014-04-17 DIAGNOSIS — I5030 Unspecified diastolic (congestive) heart failure: Secondary | ICD-10-CM | POA: Insufficient documentation

## 2014-04-17 DIAGNOSIS — E669 Obesity, unspecified: Secondary | ICD-10-CM | POA: Insufficient documentation

## 2014-04-17 DIAGNOSIS — I4892 Unspecified atrial flutter: Secondary | ICD-10-CM

## 2014-04-17 DIAGNOSIS — S62646A Nondisplaced fracture of proximal phalanx of right little finger, initial encounter for closed fracture: Secondary | ICD-10-CM | POA: Diagnosis not present

## 2014-04-17 DIAGNOSIS — Z8659 Personal history of other mental and behavioral disorders: Secondary | ICD-10-CM | POA: Diagnosis not present

## 2014-04-17 DIAGNOSIS — S2222XA Fracture of body of sternum, initial encounter for closed fracture: Secondary | ICD-10-CM | POA: Diagnosis not present

## 2014-04-17 DIAGNOSIS — Z7901 Long term (current) use of anticoagulants: Secondary | ICD-10-CM | POA: Diagnosis not present

## 2014-04-17 DIAGNOSIS — S2220XA Unspecified fracture of sternum, initial encounter for closed fracture: Secondary | ICD-10-CM

## 2014-04-17 DIAGNOSIS — S29001A Unspecified injury of muscle and tendon of front wall of thorax, initial encounter: Secondary | ICD-10-CM | POA: Diagnosis present

## 2014-04-17 DIAGNOSIS — Y9389 Activity, other specified: Secondary | ICD-10-CM | POA: Insufficient documentation

## 2014-04-17 DIAGNOSIS — Z87891 Personal history of nicotine dependence: Secondary | ICD-10-CM | POA: Insufficient documentation

## 2014-04-17 LAB — I-STAT CHEM 8, ED
BUN: 14 mg/dL (ref 6–23)
CHLORIDE: 105 meq/L (ref 96–112)
Calcium, Ion: 1.09 mmol/L — ABNORMAL LOW (ref 1.13–1.30)
Creatinine, Ser: 0.8 mg/dL (ref 0.50–1.35)
Glucose, Bld: 107 mg/dL — ABNORMAL HIGH (ref 70–99)
HEMATOCRIT: 54 % — AB (ref 39.0–52.0)
Hemoglobin: 18.4 g/dL — ABNORMAL HIGH (ref 13.0–17.0)
POTASSIUM: 4.5 meq/L (ref 3.7–5.3)
SODIUM: 138 meq/L (ref 137–147)
TCO2: 23 mmol/L (ref 0–100)

## 2014-04-17 MED ORDER — HYDROCODONE-ACETAMINOPHEN 5-325 MG PO TABS: 1.0000 | ORAL_TABLET | Freq: Four times a day (QID) | ORAL | Status: DC | PRN

## 2014-04-17 MED ORDER — HYDROMORPHONE HCL 1 MG/ML IJ SOLN
1.0000 mg | Freq: Once | INTRAMUSCULAR | Status: AC
  Administered 2014-04-17: 1 mg via INTRAVENOUS
  Filled 2014-04-17: qty 1

## 2014-04-17 MED ORDER — IOHEXOL 300 MG/ML  SOLN
100.0000 mL | Freq: Once | INTRAMUSCULAR | Status: AC | PRN
  Administered 2014-04-17: 100 mL via INTRAVENOUS

## 2014-04-17 MED ORDER — MORPHINE SULFATE 4 MG/ML IJ SOLN
4.0000 mg | Freq: Once | INTRAMUSCULAR | Status: AC
  Administered 2014-04-17: 4 mg via INTRAVENOUS
  Filled 2014-04-17: qty 1

## 2014-04-17 MED ORDER — DILTIAZEM HCL 25 MG/5ML IV SOLN
10.0000 mg | Freq: Once | INTRAVENOUS | Status: AC
  Administered 2014-04-17: 10 mg via INTRAVENOUS
  Filled 2014-04-17: qty 5

## 2014-04-17 MED ORDER — SODIUM CHLORIDE 0.9 % IV BOLUS (SEPSIS)
500.0000 mL | Freq: Once | INTRAVENOUS | Status: AC
  Administered 2014-04-17: 500 mL via INTRAVENOUS

## 2014-04-17 MED ORDER — METHOCARBAMOL 500 MG PO TABS: 500.0000 mg | ORAL_TABLET | Freq: Two times a day (BID) | ORAL | Status: DC

## 2014-04-17 MED ORDER — DILTIAZEM HCL 25 MG/5ML IV SOLN
10.0000 mg | Freq: Once | INTRAVENOUS | Status: AC
Start: 2014-04-17 — End: 2014-04-17
  Administered 2014-04-17: 10 mg via INTRAVENOUS
  Filled 2014-04-17: qty 5
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
## 2014-04-17 NOTE — ED Provider Notes (Signed)
Medical screening examination/treatment/procedure(s) were conducted as a shared visit with non-physician practitioner(s) and myself.  I personally evaluated the patient during the encounter.   EKG Interpretation   Date/Time:  Tuesday April 17 2014 07:04:28 EDT Ventricular Rate:  165 PR Interval:    QRS Duration: 98 QT Interval:  253 QTC Calculation: 419 R Axis:   83 Text Interpretation:  Age not entered, assumed to be  63 years old for  purpose of ECG interpretation Atrial flutter with 2:1 AV block ST  elevation, consider inferior injury No significant change since last  tracing Confirmed by Daryel Kenneth  MD, [place] [phone]) on 04/17/2014 8:16:13 AM       Fredia Sorrow, MD 04/17/14 1050

## 2014-04-17 NOTE — Progress Notes (Signed)
Orthopedic Tech Progress Note Patient Details:  Brian [name] [birth year] [id number] Ulna gutter applied to RUE with padding.  Arm sling applied. Ortho Devices Type of Ortho Device: Ulna gutter splint;Arm sling Ortho Device/Splint Location: RUE Ortho Device/Splint Interventions: Application   Asia R Thompson 04/17/2014, 9:49 AM

## 2014-04-22 ENCOUNTER — Other Ambulatory Visit: Payer: Self-pay | Admitting: Cardiology

## 2014-08-06 ENCOUNTER — Other Ambulatory Visit: Payer: Self-pay | Admitting: Orthopedic Surgery

## 2014-08-06 DIAGNOSIS — R0789 Other chest pain: Secondary | ICD-10-CM

## 2014-08-07 ENCOUNTER — Ambulatory Visit
Admission: RE | Admit: 2014-08-07 | Discharge: 2014-08-07 | Disposition: A | Discharge status: Home / Self Care | Payer: 59 | Source: Ambulatory Visit | Attending: Orthopedic Surgery | Admitting: Orthopedic Surgery

## 2014-08-07 DIAGNOSIS — R0789 Other chest pain: Secondary | ICD-10-CM

## 2014-08-29 ENCOUNTER — Ambulatory Visit: Payer: BC Managed Care – PPO | Admitting: Cardiology

## 2014-10-22 ENCOUNTER — Encounter: Payer: Self-pay | Admitting: Cardiology

## 2014-10-22 ENCOUNTER — Ambulatory Visit (INDEPENDENT_AMBULATORY_CARE_PROVIDER_SITE_OTHER): Payer: 59 | Admitting: Cardiology

## 2014-10-22 VITALS — BP 120/62 | HR 78 | Ht 70.0 in | Wt 261.8 lb

## 2014-10-22 DIAGNOSIS — I10 Essential (primary) hypertension: Secondary | ICD-10-CM

## 2014-10-22 DIAGNOSIS — I4892 Unspecified atrial flutter: Secondary | ICD-10-CM | POA: Diagnosis not present

## 2014-10-22 DIAGNOSIS — I5032 Chronic diastolic (congestive) heart failure: Secondary | ICD-10-CM | POA: Diagnosis not present

## 2014-10-22 NOTE — Patient Instructions (Signed)
Medication Instructions:  Your physician recommends that you continue on your current medications as directed. Please refer to the Current Medication list given to you today.  Labwork: none  Testing/Procedures: none  Follow-Up: Your physician wants you to follow-up in: 6 month ov You will receive a reminder letter in the mail two months in advance. If you don't receive a letter, please call our office to schedule the follow-up appointment.    
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
## 2014-12-10 ENCOUNTER — Other Ambulatory Visit: Payer: Self-pay | Admitting: Adult Health

## 2014-12-14 ENCOUNTER — Other Ambulatory Visit: Payer: Self-pay | Admitting: Adult Health

## 2014-12-14 ENCOUNTER — Other Ambulatory Visit: Payer: Self-pay | Admitting: Cardiology

## 2015-01-09 ENCOUNTER — Other Ambulatory Visit: Payer: Self-pay | Admitting: Cardiology

## 2015-04-19 ENCOUNTER — Encounter: Payer: Self-pay | Admitting: *Deleted

## 2015-04-24 ENCOUNTER — Ambulatory Visit (INDEPENDENT_AMBULATORY_CARE_PROVIDER_SITE_OTHER): Payer: 59 | Admitting: Cardiology

## 2015-04-24 ENCOUNTER — Ambulatory Visit: Payer: 59 | Admitting: Cardiology

## 2015-04-24 ENCOUNTER — Encounter: Payer: Self-pay | Admitting: Cardiology

## 2015-04-24 VITALS — BP 126/72 | HR 82 | Ht 70.0 in | Wt 247.6 lb

## 2015-04-24 DIAGNOSIS — I5032 Chronic diastolic (congestive) heart failure: Secondary | ICD-10-CM | POA: Diagnosis not present

## 2015-04-24 DIAGNOSIS — I10 Essential (primary) hypertension: Secondary | ICD-10-CM | POA: Diagnosis not present

## 2015-04-24 DIAGNOSIS — I480 Paroxysmal atrial fibrillation: Secondary | ICD-10-CM | POA: Diagnosis not present

## 2015-04-24 DIAGNOSIS — Z720 Tobacco use: Secondary | ICD-10-CM

## 2015-04-24 DIAGNOSIS — I4892 Unspecified atrial flutter: Secondary | ICD-10-CM | POA: Diagnosis not present

## 2015-04-24 MED ORDER — FUROSEMIDE 40 MG PO TABS: 40.0000 mg | ORAL_TABLET | Freq: Two times a day (BID) | ORAL | Status: DC

## 2015-04-24 MED ORDER — DILTIAZEM HCL ER COATED BEADS 180 MG PO CP24: 180.0000 mg | ORAL_CAPSULE | Freq: Every day | ORAL | Status: DC

## 2015-04-24 MED ORDER — RIVAROXABAN 20 MG PO TABS: 20.0000 mg | ORAL_TABLET | Freq: Every day | ORAL | Status: DC

## 2015-04-24 NOTE — Patient Instructions (Addendum)
Medication Instructions:  1- STOP Cardizem CD 360 mg 2- START Cardizem CD 180 mg by mouth daily 3- STOP Lisinopril   Labwork: Your physician recommends that you return for lab work in: next couple of days for a fasting lipid panel, CMET, CBC  Testing/Procedures: NONE  Follow-Up: Your physician recommends that you schedule a follow-up appointment in: 4 months with Dr. Mare Ferrari.   If you need a refill on your cardiac medications before your next appointment, please call your pharmacy.
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
## 2015-08-21 ENCOUNTER — Ambulatory Visit: Payer: 59 | Admitting: Cardiology

## 2015-09-27 ENCOUNTER — Encounter (HOSPITAL_BASED_OUTPATIENT_CLINIC_OR_DEPARTMENT_OTHER): Payer: Self-pay | Admitting: Emergency Medicine

## 2015-09-27 ENCOUNTER — Observation Stay (HOSPITAL_COMMUNITY): Payer: BLUE CROSS/BLUE SHIELD

## 2015-09-27 ENCOUNTER — Emergency Department (HOSPITAL_BASED_OUTPATIENT_CLINIC_OR_DEPARTMENT_OTHER): Payer: BLUE CROSS/BLUE SHIELD

## 2015-09-27 ENCOUNTER — Observation Stay (HOSPITAL_BASED_OUTPATIENT_CLINIC_OR_DEPARTMENT_OTHER)
Admission: EM | Admit: 2015-09-27 | Discharge: 2015-09-28 | Disposition: A | Discharge status: Home / Self Care | Payer: BLUE CROSS/BLUE SHIELD | Attending: Internal Medicine | Admitting: Internal Medicine

## 2015-09-27 DIAGNOSIS — R4182 Altered mental status, unspecified: Principal | ICD-10-CM | POA: Insufficient documentation

## 2015-09-27 DIAGNOSIS — Z8673 Personal history of transient ischemic attack (TIA), and cerebral infarction without residual deficits: Secondary | ICD-10-CM | POA: Diagnosis present

## 2015-09-27 DIAGNOSIS — Z9119 Patient's noncompliance with other medical treatment and regimen: Secondary | ICD-10-CM | POA: Diagnosis not present

## 2015-09-27 DIAGNOSIS — M25531 Pain in right wrist: Secondary | ICD-10-CM | POA: Diagnosis not present

## 2015-09-27 DIAGNOSIS — R2990 Unspecified symptoms and signs involving the nervous system: Secondary | ICD-10-CM | POA: Diagnosis not present

## 2015-09-27 DIAGNOSIS — I4892 Unspecified atrial flutter: Secondary | ICD-10-CM | POA: Diagnosis not present

## 2015-09-27 DIAGNOSIS — I110 Hypertensive heart disease with heart failure: Secondary | ICD-10-CM | POA: Diagnosis not present

## 2015-09-27 DIAGNOSIS — Z8739 Personal history of other diseases of the musculoskeletal system and connective tissue: Secondary | ICD-10-CM

## 2015-09-27 DIAGNOSIS — D72829 Elevated white blood cell count, unspecified: Secondary | ICD-10-CM | POA: Diagnosis not present

## 2015-09-27 DIAGNOSIS — Z79899 Other long term (current) drug therapy: Secondary | ICD-10-CM | POA: Diagnosis not present

## 2015-09-27 DIAGNOSIS — I639 Cerebral infarction, unspecified: Secondary | ICD-10-CM | POA: Diagnosis present

## 2015-09-27 DIAGNOSIS — Z6830 Body mass index (BMI) 30.0-30.9, adult: Secondary | ICD-10-CM | POA: Insufficient documentation

## 2015-09-27 DIAGNOSIS — Z87891 Personal history of nicotine dependence: Secondary | ICD-10-CM | POA: Insufficient documentation

## 2015-09-27 DIAGNOSIS — T45516A Underdosing of anticoagulants, initial encounter: Secondary | ICD-10-CM | POA: Insufficient documentation

## 2015-09-27 DIAGNOSIS — Z8249 Family history of ischemic heart disease and other diseases of the circulatory system: Secondary | ICD-10-CM | POA: Insufficient documentation

## 2015-09-27 DIAGNOSIS — G459 Transient cerebral ischemic attack, unspecified: Secondary | ICD-10-CM | POA: Diagnosis present

## 2015-09-27 DIAGNOSIS — I10 Essential (primary) hypertension: Secondary | ICD-10-CM | POA: Diagnosis present

## 2015-09-27 DIAGNOSIS — M19041 Primary osteoarthritis, right hand: Secondary | ICD-10-CM | POA: Diagnosis not present

## 2015-09-27 DIAGNOSIS — I5033 Acute on chronic diastolic (congestive) heart failure: Secondary | ICD-10-CM | POA: Diagnosis present

## 2015-09-27 DIAGNOSIS — Y92009 Unspecified place in unspecified non-institutional (private) residence as the place of occurrence of the external cause: Secondary | ICD-10-CM | POA: Diagnosis not present

## 2015-09-27 DIAGNOSIS — R200 Anesthesia of skin: Secondary | ICD-10-CM | POA: Diagnosis not present

## 2015-09-27 DIAGNOSIS — Z91120 Patient's intentional underdosing of medication regimen due to financial hardship: Secondary | ICD-10-CM | POA: Diagnosis not present

## 2015-09-27 DIAGNOSIS — I5032 Chronic diastolic (congestive) heart failure: Secondary | ICD-10-CM | POA: Diagnosis not present

## 2015-09-27 DIAGNOSIS — E669 Obesity, unspecified: Secondary | ICD-10-CM | POA: Insufficient documentation

## 2015-09-27 DIAGNOSIS — M79641 Pain in right hand: Secondary | ICD-10-CM | POA: Insufficient documentation

## 2015-09-27 DIAGNOSIS — R4781 Slurred speech: Secondary | ICD-10-CM | POA: Diagnosis not present

## 2015-09-27 DIAGNOSIS — F1410 Cocaine abuse, uncomplicated: Secondary | ICD-10-CM | POA: Diagnosis not present

## 2015-09-27 DIAGNOSIS — Z91199 Patient's noncompliance with other medical treatment and regimen due to unspecified reason: Secondary | ICD-10-CM

## 2015-09-27 LAB — CBC WITH DIFFERENTIAL/PLATELET
BASOS ABS: 0.1 10*3/uL (ref 0.0–0.1)
Basophils Relative: 1 %
EOS ABS: 0.3 10*3/uL (ref 0.0–0.7)
Eosinophils Relative: 2 %
HCT: 43.1 % (ref 39.0–52.0)
Hemoglobin: 14.2 g/dL (ref 13.0–17.0)
LYMPHS ABS: 1.2 10*3/uL (ref 0.7–4.0)
Lymphocytes Relative: 8 %
MCH: 30.5 pg (ref 26.0–34.0)
MCHC: 32.9 g/dL (ref 30.0–36.0)
MCV: 92.7 fL (ref 78.0–100.0)
MONO ABS: 1.3 10*3/uL — AB (ref 0.1–1.0)
Metamyelocytes Relative: 1 %
Monocytes Relative: 9 %
NEUTROS PCT: 79 %
Neutro Abs: 11.9 10*3/uL — ABNORMAL HIGH (ref 1.7–7.7)
Platelets: 235 10*3/uL (ref 150–400)
RBC: 4.65 MIL/uL (ref 4.22–5.81)
RDW: 13.6 % (ref 11.5–15.5)
WBC: 14.8 10*3/uL — AB (ref 4.0–10.5)

## 2015-09-27 LAB — COMPREHENSIVE METABOLIC PANEL
ALK PHOS: 55 U/L (ref 38–126)
ALT: 24 U/L (ref 17–63)
ANION GAP: 6 (ref 5–15)
AST: 23 U/L (ref 15–41)
Albumin: 3.8 g/dL (ref 3.5–5.0)
BILIRUBIN TOTAL: 0.6 mg/dL (ref 0.3–1.2)
BUN: 15 mg/dL (ref 6–20)
CALCIUM: 8.6 mg/dL — AB (ref 8.9–10.3)
CO2: 25 mmol/L (ref 22–32)
Chloride: 107 mmol/L (ref 101–111)
Creatinine, Ser: 0.71 mg/dL (ref 0.61–1.24)
GFR calc Af Amer: >60 mL/min (ref >60)
GLUCOSE: 127 mg/dL — AB (ref 65–99)
POTASSIUM: 4.8 mmol/L (ref 3.5–5.1)
Sodium: 138 mmol/L (ref 135–145)
TOTAL PROTEIN: 7.5 g/dL (ref 6.5–8.1)

## 2015-09-27 LAB — URINALYSIS, ROUTINE W REFLEX MICROSCOPIC
BILIRUBIN URINE: NEGATIVE
Glucose, UA: NEGATIVE mg/dL
Hgb urine dipstick: NEGATIVE
KETONES UR: NEGATIVE mg/dL
Leukocytes, UA: NEGATIVE
NITRITE: NEGATIVE
Protein, ur: NEGATIVE mg/dL
Specific Gravity, Urine: >1.046 — ABNORMAL HIGH (ref 1.005–1.030)
pH: 6 (ref 5.0–8.0)

## 2015-09-27 LAB — RAPID URINE DRUG SCREEN, HOSP PERFORMED
AMPHETAMINES: NOT DETECTED
Barbiturates: NOT DETECTED
Benzodiazepines: NOT DETECTED
Cocaine: POSITIVE — AB
Opiates: POSITIVE — AB
TETRAHYDROCANNABINOL: NOT DETECTED

## 2015-09-27 LAB — CBG MONITORING, ED: Glucose-Capillary: 150 mg/dL — ABNORMAL HIGH (ref 65–99)

## 2015-09-27 LAB — TROPONIN I

## 2015-09-27 LAB — APTT: aPTT: 41 seconds — ABNORMAL HIGH (ref 24–37)

## 2015-09-27 LAB — ETHANOL: Alcohol, Ethyl (B): <5 mg/dL (ref <5)

## 2015-09-27 LAB — PROTIME-INR
INR: 1.15 (ref 0.00–1.49)
PROTHROMBIN TIME: 14.9 s (ref 11.6–15.2)

## 2015-09-27 LAB — URIC ACID: Uric Acid, Serum: 5.8 mg/dL (ref 4.4–7.6)

## 2015-09-27 MED ORDER — FUROSEMIDE 20 MG PO TABS
10.0000 mg | ORAL_TABLET | Freq: Every day | ORAL | Status: DC
  Administered 2015-09-27 – 2015-09-28 (×2): 10 mg via ORAL
  Filled 2015-09-27 (×2): qty 1

## 2015-09-27 MED ORDER — STROKE: EARLY STAGES OF RECOVERY BOOK
Freq: Once | Status: AC
  Administered 2015-09-27: 20:00:00

## 2015-09-27 MED ORDER — SENNOSIDES-DOCUSATE SODIUM 8.6-50 MG PO TABS: 1.0000 | ORAL_TABLET | Freq: Every evening | ORAL | Status: DC | PRN

## 2015-09-27 MED ORDER — RIVAROXABAN 20 MG PO TABS
20.0000 mg | ORAL_TABLET | Freq: Every day | ORAL | Status: DC
  Administered 2015-09-27: 20 mg via ORAL
  Filled 2015-09-27: qty 1

## 2015-09-27 MED ORDER — TRAMADOL HCL 50 MG PO TABS
50.0000 mg | ORAL_TABLET | Freq: Four times a day (QID) | ORAL | Status: DC | PRN
  Administered 2015-09-27 – 2015-09-28 (×2): 50 mg via ORAL
  Filled 2015-09-27 (×2): qty 1

## 2015-09-27 MED ORDER — IOPAMIDOL (ISOVUE-370) INJECTION 76%
75.0000 mL | Freq: Once | INTRAVENOUS | Status: AC | PRN
  Administered 2015-09-27: 75 mL via INTRAVENOUS

## 2015-09-27 MED ORDER — SODIUM CHLORIDE 0.9 % IV SOLN: INTRAVENOUS | Status: DC

## 2015-09-27 MED ORDER — SODIUM CHLORIDE 0.9 % IV SOLN
INTRAVENOUS | Status: AC
  Administered 2015-09-27: 10:00:00 via INTRAVENOUS

## 2015-09-27 MED ORDER — ACETAMINOPHEN 650 MG RE SUPP: 650.0000 mg | RECTAL | Status: DC | PRN

## 2015-09-27 MED ORDER — ACETAMINOPHEN 325 MG PO TABS
650.0000 mg | ORAL_TABLET | ORAL | Status: DC | PRN
  Administered 2015-09-27 (×2): 650 mg via ORAL
  Filled 2015-09-27 (×2): qty 2

## 2015-09-27 NOTE — Evaluation (Signed)
Clinical/Bedside Swallow Evaluation Patient Details  Name: Brian [name] MRN: [id number] Date of Birth: [birth year]  Today's Date: 09/27/2015 Time: SLP Start Time (ACUTE ONLY): 1205 SLP Stop Time (ACUTE ONLY): 1220 SLP Time Calculation (min) (ACUTE ONLY): 15 min  Past Medical History:  Past Medical History  Diagnosis Date  . Anxiety   . Atrial flutter ([place])     a. 08/2012  . Diastolic CHF ([place])     a. 08/2012, in setting of aflutter;  b. 08/2012 Echo: EF 55-60%.  . Obesity   . Tobacco abuse   . Shortness of breath    Past Surgical History:  Past Surgical History  Procedure Laterality Date  . Microlaryngoscopy  09/02/2007    with excision of right vocal cord mass, Dr. Constance Holster   HPI:  Patient is a 65 y.o. male with PMH: chronic diastolic heart failure, tobacco abuse, gout, obesity, anxiety. He presented to [facility] on 3/31 with c/o slurred speech and right hand and wrist pain. Patient apparently took some Nyquil as well as his roommate's Clonazepam before going to bed.   Assessment / Plan / Recommendation Clinical Impression  Patient presents with a functional swallow with no overt s/s of aspiration or penetration, good laryngeal elevation and pharyngeal contraction per palpation, and no oral residuals with any of the tested solids or liquids. Patient noted with some mild dysarthria but SLP did not observe any significant oral-motor deficit. Patient did ask if the medications he took last night could impact his speech (clonazepam and nyquil).    Aspiration Risk  No limitations    Diet Recommendation Regular;Thin liquid   Liquid Administration via: Cup;Straw Medication Administration: Whole meds with liquid Supervision: Patient able to self feed    Other  Recommendations     Follow up Recommendations  None    Frequency and Duration    N/A      Prognosis  N/A      Swallow Study   General Date of Onset: 09/27/15 HPI: Patient is a 65 y.o. male with PMH: chronic  diastolic heart failure, tobacco abuse, gout, obesity, anxiety. He presented to [facility] on 3/31 with c/o slurred speech and right hand and wrist pain. Patient apparently took some Nyquil as well as his roommate's Clonazepam before going to bed. Type of Study: Bedside Swallow Evaluation Previous Swallow Assessment: N/A Diet Prior to this Study: NPO Temperature Spikes Noted: No Respiratory Status: Room air History of Recent Intubation: No Behavior/Cognition: Alert;Cooperative;Pleasant mood Oral Cavity Assessment: Within Functional Limits Oral Care Completed by SLP: No Oral Cavity - Dentition: Dentures, top;Missing dentition Vision: Functional for self-feeding Self-Feeding Abilities: Able to feed self Patient Positioning: Upright in bed;Postural control adequate for testing Baseline Vocal Quality: Normal Volitional Cough: Strong Volitional Swallow: Able to elicit    Oral/Motor/Sensory Function Overall Oral Motor/Sensory Function: Within functional limits   Ice Chips     Thin Liquid Thin Liquid: Within functional limits Presentation: Cup;Straw;Self Fed Other Comments: No overt s/s of aspiration observed during this assessment    Nectar Thick Nectar Thick Liquid: Not tested   Honey Thick Honey Thick Liquid: Not tested   Puree Puree: Within functional limits Presentation: Self Fed;Spoon   Solid   GO   Solid: Within functional limits Presentation: Self Fed    Functional Assessment Tool Used: Bedside swallow evaluation; clinician judgement Functional Limitations: Swallowing Swallow Current Status [id number]): 0 percent impaired, limited or restricted Swallow Goal Status [id number]): 0 percent impaired, limited or restricted Swallow Discharge Status [id number]): 0  percent impaired, limited or restricted   [name] 09/27/2015,3:21 PM    Sonia Baller, [place], CCC-SLP 09/27/2015 3:22 PM

## 2015-09-27 NOTE — ED Notes (Addendum)
Pt reports tingling in right hand since yesterday at work, pt had trouble sleeping last night.  Pt reports that right hand had increasing pain today, pulses present, denies injury.  Pt has no stroke hx, pt alert and oriented. Pt dosing off in triage, speech according to family is sluggish and slurred.  Slurred speech started at 0400 today.

## 2015-09-27 NOTE — ED Notes (Signed)
MD at bedside. 

## 2015-09-27 NOTE — ED Notes (Signed)
Pt taken to CTA

## 2015-09-27 NOTE — ED Notes (Signed)
Pt taken to XR.  

## 2015-09-27 NOTE — ED Notes (Signed)
Pt returned from CT °

## 2015-09-27 NOTE — Progress Notes (Signed)
Patient states he has not been compliant with xarelto prescription, states that he may take an aspirin to cover his xarelto prescription or takes xarelto "every so often." RN educated patient on importance of medication compliance. Patient stated he was previously unable to fill prescription because his prescription was only available to him at [facility] for insurance reasons. Patient states that because his family was treated so well at [facility], he will not fill prescriptions at [facility]. Patient states he now has new insurance which he believes will be compatible with filling at [facility] pharmacy.

## 2015-09-27 NOTE — Consult Note (Signed)
Requesting Physician: Dr. Sherral Hammers     Chief Complaint:  stroke    HPI:                                                                                                                                         Brian [name] is an 65 y.o. male presenting to ED after noting speech problems and gait abnormality at 0400 hours this AM. Pt's roommate awoke him at 3am today for work. Roommate states that around 4:30am he noticed pt had slurred speech prompting visit to ED. Slurred speech persisted in ED, but is now resolved. Pt's only complaint is right hand and wrist pain. Given symptoms and pt's medical hx, there was concern for stroke. Pt has been admitted for further evaluation and treatment.   Pt states he does not take his Xarelto regularly due to cost of medication. He also admits he is noncompliant with Diltiazem and Lasix. He denies drug use, but UDS +cocaine and opiates. CT head, CT angio head and neck are unremarkable. Neurology called by EDP to evaluate pt. Pt denies recent headache, dizziness, chest pain, sob, abominal pain, n/v/d, or weakness.  Date last known well: yesterday Time last known well: 0300 tPA Given: No: on [facility]     Past Medical History  Diagnosis Date  . Anxiety   . Atrial flutter ([place])     a. 08/2012  . Diastolic CHF ([place])     a. 08/2012, in setting of aflutter;  b. 08/2012 Echo: EF 55-60%.  . Obesity   . Tobacco abuse   . Shortness of breath     Past Surgical History  Procedure Laterality Date  . Microlaryngoscopy  09/02/2007    with excision of right vocal cord mass, Dr. Constance Holster    Family History  Problem Relation Age of Onset  . Cancer Mother   . Heart disease Father   . Heart attack Father   . Cancer Father    Social History:  reports that he has quit smoking. His smoking use included Cigarettes. He has a 50 pack-year smoking history. He has never used smokeless tobacco. He reports that he drinks alcohol. He reports that he does not use illicit  drugs.  Allergies: No Known Allergies  Medications:  Prior to Admission:  Prescriptions prior to admission  Medication Sig Dispense Refill Last Dose  . aspirin EC 81 MG tablet Take 81 mg by mouth daily.   09/26/2015 at Unknown time  . diltiazem (CARDIZEM CD) 180 MG 24 hr capsule Take 1 capsule (180 mg total) by mouth daily. 30 capsule 6 09/26/2015 at Unknown time  . furosemide (LASIX) 20 MG tablet Take 10 mg by mouth daily.   09/24/2015  . rivaroxaban (XARELTO) 20 MG TABS tablet Take 1 tablet (20 mg total) by mouth daily with supper. 30 tablet 6 09/24/2015   Scheduled: .  stroke: mapping our early stages of recovery book   Does not apply Once  . sodium chloride   Intravenous STAT  . furosemide  10 mg Oral Daily  . rivaroxaban  20 mg Oral Q supper    ROS:                                                                                                                                       History obtained from the patient  General ROS: negative for - chills, fatigue, fever, night sweats, weight gain or weight loss Psychological ROS: negative for - behavioral disorder, hallucinations, memory difficulties, mood swings or suicidal ideation Ophthalmic ROS: negative for - blurry vision, double vision, eye pain or loss of vision ENT ROS: negative for - epistaxis, nasal discharge, oral lesions, sore throat, tinnitus or vertigo Allergy and Immunology ROS: negative for - hives or itchy/watery eyes Hematological and Lymphatic ROS: negative for - bleeding problems, bruising or swollen lymph nodes Endocrine ROS: negative for - galactorrhea, hair pattern changes, polydipsia/polyuria or temperature intolerance Respiratory ROS: negative for - cough, hemoptysis, shortness of breath or wheezing Cardiovascular ROS: negative for - chest pain, dyspnea on exertion, edema or irregular  heartbeat Gastrointestinal ROS: negative for - abdominal pain, diarrhea, hematemesis, nausea/vomiting or stool incontinence Genito-Urinary ROS: negative for - dysuria, hematuria, incontinence or urinary frequency/urgency Musculoskeletal ROS: negative for - joint swelling or muscular weakness Neurological ROS: as noted in HPI Dermatological ROS: negative for rash and skin lesion changes  Neurologic Examination:                                                                                                      Blood pressure 131/64, pulse 65, temperature 98.2 F (36.8 C), temperature source Oral, resp. rate 18, height 5\' 10"  (1.778 m), weight 95.255 kg (210 lb), SpO2 98 %.  HEENT-  Normocephalic, no lesions,  without obvious abnormality.  Normal external eye and conjunctiva.  Normal TM's bilaterally.  Normal auditory canals and external ears. Normal external nose, mucus membranes and septum.  Normal pharynx. Cardiovascular- irregularly irregular rhythm, pulses palpable throughout   Lungs- chest clear, no wheezing, rales, normal symmetric air entry Abdomen- normal findings: bowel sounds normal Extremities- no edema Lymph-no adenopathy palpable Musculoskeletal-no joint tenderness, deformity or swelling Skin-warm and dry, no hyperpigmentation, vitiligo, or suspicious lesions  Neurological Examination Mental Status: Alert, oriented, thought content appropriate.  Speech fluent without evidence of aphasia.  Able to follow 3 step commands without difficulty. Cranial Nerves: II:  Visual fields grossly normal, pupils equal, round, reactive to light and accommodation III,IV, VI: ptosis not present, extra-ocular motions intact bilaterally V,VII: smile symmetric, facial light touch sensation normal bilaterally VIII: hearing normal bilaterally IX,X: uvula rises symmetrically XI: bilateral shoulder shrug XII: midline tongue extension Motor: Right : Upper extremity   5/5    Left:     Upper extremity    5/5  Lower extremity   5/5     Lower extremity   5/5 --pain in right hand and will not move it.  Tone and bulk:normal tone throughout; no atrophy noted Sensory: Pinprick and light touch intact throughout, bilaterally Deep Tendon Reflexes: 2+ and symmetric throughout Plantars: Right: downgoing   Left: downgoing Cerebellar: normal finger-to-nose, Gait: not tested       Lab Results: Basic Metabolic Panel:  Recent Labs Lab 09/27/15 0745  NA 138  K 4.8  CL 107  CO2 25  GLUCOSE 127*  BUN 15  CREATININE 0.71  CALCIUM 8.6*    Liver Function Tests:  Recent Labs Lab 09/27/15 0745  AST 23  ALT 24  ALKPHOS 55  BILITOT 0.6  PROT 7.5  ALBUMIN 3.8   No results for input(s): LIPASE, AMYLASE in the last 168 hours. No results for input(s): AMMONIA in the last 168 hours.  CBC:  Recent Labs Lab 09/27/15 0745  WBC 14.8*  NEUTROABS 11.9*  HGB 14.2  HCT 43.1  MCV 92.7  PLT 235    Cardiac Enzymes:  Recent Labs Lab 09/27/15 0745  TROPONINI <0.03    Lipid Panel: No results for input(s): CHOL, TRIG, HDL, CHOLHDL, VLDL, LDLCALC in the last 168 hours.  CBG:  Recent Labs Lab 09/27/15 0800  GLUCAP 150*    Microbiology: Results for orders placed or performed during the hospital encounter of 09/04/12  Urine culture     Status: None   Collection Time: 09/05/12  8:39 AM  Result Value Ref Range Status   Specimen Description URINE, CLEAN CATCH  Final   Special Requests NONE  Final   Culture  Setup Time 09/05/2012 09:28  Final   Colony Count 9,000 COLONIES/ML  Final   Culture INSIGNIFICANT GROWTH  Final   Report Status 09/06/2012 FINAL  Final    Coagulation Studies:  Recent Labs  09/27/15 0745  LABPROT 14.9  INR 1.15    Imaging: Ct Angio Head W/cm &/or Wo Cm  09/27/2015  CLINICAL DATA:  Altered mental status, slurred speech, and right hand numbness. EXAM: CT ANGIOGRAPHY HEAD AND NECK TECHNIQUE: Multidetector CT imaging of the head and neck was  performed using the standard protocol during bolus administration of intravenous contrast. Multiplanar CT image reconstructions and MIPs were obtained to evaluate the vascular anatomy. Carotid stenosis measurements (when applicable) are obtained utilizing NASCET criteria, using the distal internal carotid diameter as the denominator. CONTRAST:  75 cc Isovue 370 intravenous. COMPARISON:  Head CT from earlier the same day FINDINGS: CTA NECK Aortic arch: Atheromatous calcification and thickening of the wall without aneurysm or dissection. Three vessel branching Right carotid system: Degraded by motion but best obtainable given condition of patient. Noncalcified plaque on the distal common carotid wall. Mild mainly calcified plaque at the carotid bifurcation. No stenosis, dissection, or beading. Left carotid system: Motion degradation as noted above. Atheromatous wall thickening, greatest at the proximal ICA where there is a calcified plaque narrowing the lumen by ~30%. Below this mild stenosis is an area of curvilinear low-density in the lumen that is likely motion and flow artifact, not discrete enough to represent carotid web. No flow limiting stenosis, dissection, ulcerated plaque, or vessel beading. Vertebral arteries:No proximal subclavian artery stenosis. Motion degradation intermittently at the level of the vertebral arteries. Negative for stenosis (but particularly limited at the left vertebral artery ostium due to soft tissue attenuation and streak artifact) or dissection Skeleton: Spondylosis and facet arthropathy. Other neck: No incidental mass or adenopathy in the neck. CTA HEAD Motion degraded above the level of the circle-of-Willis. Anterior circulation: Intact circle-of-Willis with small communicating arteries. The posterior communicating arteries have small infundibula. Symmetric carotid arteries with atheromatous calcification on the carotid siphons but no stenosis. The A1 and M1 segments are widely  patent. No major branch occlusion is seen. No vessel beading or flow limiting stenosis. Negative for aneurysm. Posterior circulation: Symmetric vertebral arteries and vertebrobasilar branching. Smooth and widely patent vertebral, basilar, and posterior cerebral arteries. No evidence of aneurysm or vascular malformation. Venous sinuses: Patent Anatomic variants: None Delayed phase: No parenchymal enhancement or mass lesion identified. IMPRESSION: 1. No acute arterial finding or flow limiting stenosis. 2. Cervical and intracranial atherosclerosis as described. 3. Intermittently motion degraded. Electronically Signed   By: Monte Fantasia M.D.   On: 09/27/2015 09:12   Dg Chest 2 View  09/27/2015  CLINICAL DATA:  Slurred speech and altered mental status EXAM: CHEST  2 VIEW COMPARISON:  Chest CT August 07, 2014; chest radiograph Nov 17, 2014 FINDINGS: There is slight scarring in the left base. The lungs elsewhere are clear. Heart size and pulmonary vascularity are normal. No adenopathy. No bone lesions. IMPRESSION: Mild scarring left base. Lungs elsewhere clear. Cardiac silhouette within normal limits. Electronically Signed   By: Lowella Grip III M.D.   On: 09/27/2015 10:47   Dg Wrist Complete Right  09/27/2015  CLINICAL DATA:  Right wrist pain and swelling EXAM: RIGHT WRIST - COMPLETE 3+ VIEW COMPARISON:  04/23/2015 FINDINGS: Four views of the right wrist submitted. No acute fracture or subluxation. Mild degenerative narrowing of radiocarpal joint. Minimal degenerative changes first carpometacarpal joint. IMPRESSION: No acute fracture or subluxation.  Mild degenerative changes. Electronically Signed   By: Lahoma Crocker M.D.   On: 09/27/2015 10:45   Ct Head Wo Contrast  09/27/2015  CLINICAL DATA:  Slurred speech, altered mental status EXAM: CT HEAD WITHOUT CONTRAST TECHNIQUE: Contiguous axial images were obtained from the base of the skull through the vertex without intravenous contrast. COMPARISON:   04/17/2014 FINDINGS: Brain: No intracranial hemorrhage, mass effect or midline shift. Mild cerebral atrophy. Ventricular size is stable from prior exam. There are motion artifacts. No definite acute cortical infarction. No mass lesion is noted on this unenhanced scan. No intra or extra-axial fluid collection. Vascular: Mild atherosclerotic calcifications of carotid siphon. Skull: No skull fracture.  No destructive bony lesions. Sinuses/Orbits: No acute findings. Other: None. IMPRESSION: No acute intracranial abnormality. No definite acute cortical infarction.  Mild cerebral atrophy. There are motion artifacts. Electronically Signed   By: Lahoma Crocker M.D.   On: 09/27/2015 08:19   Ct Angio Neck W/cm &/or Wo/cm  09/27/2015  CLINICAL DATA:  Altered mental status, slurred speech, and right hand numbness. EXAM: CT ANGIOGRAPHY HEAD AND NECK TECHNIQUE: Multidetector CT imaging of the head and neck was performed using the standard protocol during bolus administration of intravenous contrast. Multiplanar CT image reconstructions and MIPs were obtained to evaluate the vascular anatomy. Carotid stenosis measurements (when applicable) are obtained utilizing NASCET criteria, using the distal internal carotid diameter as the denominator. CONTRAST:  75 cc Isovue 370 intravenous. COMPARISON:  Head CT from earlier the same day FINDINGS: CTA NECK Aortic arch: Atheromatous calcification and thickening of the wall without aneurysm or dissection. Three vessel branching Right carotid system: Degraded by motion but best obtainable given condition of patient. Noncalcified plaque on the distal common carotid wall. Mild mainly calcified plaque at the carotid bifurcation. No stenosis, dissection, or beading. Left carotid system: Motion degradation as noted above. Atheromatous wall thickening, greatest at the proximal ICA where there is a calcified plaque narrowing the lumen by ~30%. Below this mild stenosis is an area of curvilinear  low-density in the lumen that is likely motion and flow artifact, not discrete enough to represent carotid web. No flow limiting stenosis, dissection, ulcerated plaque, or vessel beading. Vertebral arteries:No proximal subclavian artery stenosis. Motion degradation intermittently at the level of the vertebral arteries. Negative for stenosis (but particularly limited at the left vertebral artery ostium due to soft tissue attenuation and streak artifact) or dissection Skeleton: Spondylosis and facet arthropathy. Other neck: No incidental mass or adenopathy in the neck. CTA HEAD Motion degraded above the level of the circle-of-Willis. Anterior circulation: Intact circle-of-Willis with small communicating arteries. The posterior communicating arteries have small infundibula. Symmetric carotid arteries with atheromatous calcification on the carotid siphons but no stenosis. The A1 and M1 segments are widely patent. No major branch occlusion is seen. No vessel beading or flow limiting stenosis. Negative for aneurysm. Posterior circulation: Symmetric vertebral arteries and vertebrobasilar branching. Smooth and widely patent vertebral, basilar, and posterior cerebral arteries. No evidence of aneurysm or vascular malformation. Venous sinuses: Patent Anatomic variants: None Delayed phase: No parenchymal enhancement or mass lesion identified. IMPRESSION: 1. No acute arterial finding or flow limiting stenosis. 2. Cervical and intracranial atherosclerosis as described. 3. Intermittently motion degraded. Electronically Signed   By: Monte Fantasia M.D.   On: 09/27/2015 09:12       Assessment and plan discussed with with attending physician and they are in agreement.    Etta Quill PA-C [facility] [phone]  Will be seen by Dr. Silverio Decamp. Please see his attestation note for A/P for any additional work up recommendations.   09/27/2015, 3:14 PM   Assessment: 65 y.o. male with possible TIA versus CVA. HE  states he had muscle relaxants and benadryl in his system and feels this ,ay be the cause. However he is on Xeralto and has not been taking this due to running out. Agree with MRI brain to evaluate for CVA  Stroke Risk Factors - atrial fibrillation

## 2015-09-27 NOTE — ED Notes (Signed)
02 applied [place] 2L

## 2015-09-27 NOTE — ED Notes (Signed)
Attempted report x1. 

## 2015-09-27 NOTE — ED Provider Notes (Signed)
CSN: [id number]     Arrival date & time 09/27/15  [id number] History   First MD Initiated Contact with Patient 09/27/15 [phone]     Chief Complaint  Patient presents with  . Code Stroke    An emergency department physician performed an initial assessment on this suspected stroke patient at 0808. (Consider location/radiation/quality/duration/timing/severity/associated sxs/prior Treatment) The history is provided by the patient and a friend.   Patient with acute onset of speech problems abnormal gait at 4 in the morning. Patient is normally up at 3 and was fine at that time. 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 Usually they arrive at the stored for in the morning. Patient's friend is with him is also his roommate. Store opens up at 6 in the morning customers noted that the manager was not his usual self.  Having trouble walking and had slurred speech. Seemed very sleepy. More sleepy upon arrival here. Patient with complaint of right arm forearm pain wrist pain last evening. Had trouble sleeping. No known injury to the arm. Patient is on blood thinners for atrial flutter.  Past Medical History  Diagnosis Date  . Anxiety   . Atrial flutter ([place])     a. 08/2012  . Diastolic CHF ([place])     a. 08/2012, in setting of aflutter;  b. 08/2012 Echo: EF 55-60%.  . Obesity   . Tobacco abuse   . Shortness of breath    Past Surgical History  Procedure Laterality Date  . Microlaryngoscopy  09/02/2007    with excision of right vocal cord mass, Dr. Constance Holster   Family History  Problem Relation Age of Onset  . Cancer Mother   . Heart disease Father   . Heart attack Father   . Cancer Father    Social History  Substance Use Topics  . Smoking status: Former Smoker -- 1.00 packs/day for 50 years    Types: Cigarettes  . Smokeless tobacco: Never Used     Comment: quit 11/04/12  . Alcohol Use: Yes     Comment: socially    Review of Systems  Unable to perform ROS: Mental status change   Neurological: Positive for speech difficulty.  Hematological: Bruises/bleeds easily.  Psychiatric/Behavioral: Positive for confusion.      Allergies  Review of patient's allergies indicates no known allergies.  Home Medications   Prior to Admission medications   Medication Sig Start Date End Date Taking? Authorizing Provider  diltiazem (CARDIZEM CD) 180 MG 24 hr capsule Take 1 capsule (180 mg total) by mouth daily. 04/24/15  Yes Isaiah Serge, NP  rivaroxaban (XARELTO) 20 MG TABS tablet Take 1 tablet (20 mg total) by mouth daily with supper. 04/24/15  Yes Isaiah Serge, NP   BP 107/70 mmHg  Pulse 62  Temp(Src) 97.6 F (36.4 C) (Oral)  Resp 17  Ht 5\' 10"  (1.778 m)  Wt 95.255 kg  BMI 30.13 kg/m2  SpO2 92% Physical Exam  Constitutional: He appears well-developed and well-nourished. He appears distressed.  HENT:  Head: Normocephalic.  Mucous membranes dry.  Eyes: Conjunctivae and EOM are normal. Pupils are equal, round, and reactive to light.  Neck: Normal range of motion. Neck supple.  Cardiovascular: Normal rate, regular rhythm and normal heart sounds.   No murmur heard. Pulmonary/Chest: Effort normal and breath sounds normal. No respiratory distress. He has no wheezes.  Abdominal: Soft. Bowel sounds are normal. There is no tenderness.  Musculoskeletal: Normal range of motion. He exhibits edema and tenderness.  Increased warmth to right wrist no redness. Slight swelling. No lower extremity edema. Tenderness to palpation particularly movement of the right wrist. Radial pulses 2+.  Neurological: A cranial nerve deficit is present. He exhibits abnormal muscle tone. Coordination abnormal.  Patient drowsy but will wake up and follow commands. Patient's speech is slurred. Questionable weakness in the right arm. Historically gait was abnormal. Lower extremity strength seems to be normal. Eye tracking normal.  Skin: No erythema.  Nursing note and vitals reviewed.   ED Course   Procedures (including critical care time) Labs Review Labs Reviewed  APTT - Abnormal; Notable for the following:    aPTT 41 (*)    All other components within normal limits  COMPREHENSIVE METABOLIC PANEL - Abnormal; Notable for the following:    Glucose, Bld 127 (*)    Calcium 8.6 (*)    All other components within normal limits  CBC WITH DIFFERENTIAL/PLATELET - Abnormal; Notable for the following:    WBC 14.8 (*)    Neutro Abs 11.9 (*)    Monocytes Absolute 1.3 (*)    All other components within normal limits  CBG MONITORING, ED - Abnormal; Notable for the following:    Glucose-Capillary 150 (*)    All other components within normal limits  ETHANOL  PROTIME-INR  TROPONIN I  CBC  DIFFERENTIAL  URINE RAPID DRUG SCREEN, HOSP PERFORMED  URINALYSIS, ROUTINE W REFLEX MICROSCOPIC (NOT AT [facility])    Imaging Review Ct Angio Head W/cm &/or Wo Cm  09/27/2015  CLINICAL DATA:  Altered mental status, slurred speech, and right hand numbness. EXAM: CT ANGIOGRAPHY HEAD AND NECK TECHNIQUE: Multidetector CT imaging of the head and neck was performed using the standard protocol during bolus administration of intravenous contrast. Multiplanar CT image reconstructions and MIPs were obtained to evaluate the vascular anatomy. Carotid stenosis measurements (when applicable) are obtained utilizing NASCET criteria, using the distal internal carotid diameter as the denominator. CONTRAST:  75 cc Isovue 370 intravenous. COMPARISON:  Head CT from earlier the same day FINDINGS: CTA NECK Aortic arch: Atheromatous calcification and thickening of the wall without aneurysm or dissection. Three vessel branching Right carotid system: Degraded by motion but best obtainable given condition of patient. Noncalcified plaque on the distal common carotid wall. Mild mainly calcified plaque at the carotid bifurcation. No stenosis, dissection, or beading. Left carotid system: Motion degradation as noted above. Atheromatous wall  thickening, greatest at the proximal ICA where there is a calcified plaque narrowing the lumen by ~30%. Below this mild stenosis is an area of curvilinear low-density in the lumen that is likely motion and flow artifact, not discrete enough to represent carotid web. No flow limiting stenosis, dissection, ulcerated plaque, or vessel beading. Vertebral arteries:No proximal subclavian artery stenosis. Motion degradation intermittently at the level of the vertebral arteries. Negative for stenosis (but particularly limited at the left vertebral artery ostium due to soft tissue attenuation and streak artifact) or dissection Skeleton: Spondylosis and facet arthropathy. Other neck: No incidental mass or adenopathy in the neck. CTA HEAD Motion degraded above the level of the circle-of-Willis. Anterior circulation: Intact circle-of-Willis with small communicating arteries. The posterior communicating arteries have small infundibula. Symmetric carotid arteries with atheromatous calcification on the carotid siphons but no stenosis. The A1 and M1 segments are widely patent. No major branch occlusion is seen. No vessel beading or flow limiting stenosis. Negative for aneurysm. Posterior circulation: Symmetric vertebral arteries and vertebrobasilar branching. Smooth and widely patent vertebral, basilar, and posterior cerebral arteries. No evidence  of aneurysm or vascular malformation. Venous sinuses: Patent Anatomic variants: None Delayed phase: No parenchymal enhancement or mass lesion identified. IMPRESSION: 1. No acute arterial finding or flow limiting stenosis. 2. Cervical and intracranial atherosclerosis as described. 3. Intermittently motion degraded. Electronically Signed   By: Monte Fantasia M.D.   On: 09/27/2015 09:12   Ct Head Wo Contrast  09/27/2015  CLINICAL DATA:  Slurred speech, altered mental status EXAM: CT HEAD WITHOUT CONTRAST TECHNIQUE: Contiguous axial images were obtained from the base of the skull through  the vertex without intravenous contrast. COMPARISON:  04/17/2014 FINDINGS: Brain: No intracranial hemorrhage, mass effect or midline shift. Mild cerebral atrophy. Ventricular size is stable from prior exam. There are motion artifacts. No definite acute cortical infarction. No mass lesion is noted on this unenhanced scan. No intra or extra-axial fluid collection. Vascular: Mild atherosclerotic calcifications of carotid siphon. Skull: No skull fracture.  No destructive bony lesions. Sinuses/Orbits: No acute findings. Other: None. IMPRESSION: No acute intracranial abnormality. No definite acute cortical infarction. Mild cerebral atrophy. There are motion artifacts. Electronically Signed   By: Lahoma Crocker M.D.   On: 09/27/2015 08:19   Ct Angio Neck W/cm &/or Wo/cm  09/27/2015  CLINICAL DATA:  Altered mental status, slurred speech, and right hand numbness. EXAM: CT ANGIOGRAPHY HEAD AND NECK TECHNIQUE: Multidetector CT imaging of the head and neck was performed using the standard protocol during bolus administration of intravenous contrast. Multiplanar CT image reconstructions and MIPs were obtained to evaluate the vascular anatomy. Carotid stenosis measurements (when applicable) are obtained utilizing NASCET criteria, using the distal internal carotid diameter as the denominator. CONTRAST:  75 cc Isovue 370 intravenous. COMPARISON:  Head CT from earlier the same day FINDINGS: CTA NECK Aortic arch: Atheromatous calcification and thickening of the wall without aneurysm or dissection. Three vessel branching Right carotid system: Degraded by motion but best obtainable given condition of patient. Noncalcified plaque on the distal common carotid wall. Mild mainly calcified plaque at the carotid bifurcation. No stenosis, dissection, or beading. Left carotid system: Motion degradation as noted above. Atheromatous wall thickening, greatest at the proximal ICA where there is a calcified plaque narrowing the lumen by ~30%. Below  this mild stenosis is an area of curvilinear low-density in the lumen that is likely motion and flow artifact, not discrete enough to represent carotid web. No flow limiting stenosis, dissection, ulcerated plaque, or vessel beading. Vertebral arteries:No proximal subclavian artery stenosis. Motion degradation intermittently at the level of the vertebral arteries. Negative for stenosis (but particularly limited at the left vertebral artery ostium due to soft tissue attenuation and streak artifact) or dissection Skeleton: Spondylosis and facet arthropathy. Other neck: No incidental mass or adenopathy in the neck. CTA HEAD Motion degraded above the level of the circle-of-Willis. Anterior circulation: Intact circle-of-Willis with small communicating arteries. The posterior communicating arteries have small infundibula. Symmetric carotid arteries with atheromatous calcification on the carotid siphons but no stenosis. The A1 and M1 segments are widely patent. No major branch occlusion is seen. No vessel beading or flow limiting stenosis. Negative for aneurysm. Posterior circulation: Symmetric vertebral arteries and vertebrobasilar branching. Smooth and widely patent vertebral, basilar, and posterior cerebral arteries. No evidence of aneurysm or vascular malformation. Venous sinuses: Patent Anatomic variants: None Delayed phase: No parenchymal enhancement or mass lesion identified. IMPRESSION: 1. No acute arterial finding or flow limiting stenosis. 2. Cervical and intracranial atherosclerosis as described. 3. Intermittently motion degraded. Electronically Signed   By: Monte Fantasia M.D.   On:  09/27/2015 09:12   I have personally reviewed and evaluated these images and lab results as part of my medical decision-making.   EKG Interpretation   Date/Time:  Friday September 27 2015 07:52:59 EDT Ventricular Rate:  62 PR Interval:  153 QRS Duration: 99 QT Interval:  417 QTC Calculation: 423 R Axis:   83 Text  Interpretation:  Sinus rhythm Borderline right axis deviation  Confirmed by Toneka Fullen  MD, Vlad Mayberry ([id number]) on 09/27/2015 8:39:03 AM       CRITICAL CARE Performed by: Fredia Sorrow Total critical care time: 60 minutes Critical care time was exclusive of separately billable procedures and treating other patients. Critical care was necessary to treat or prevent imminent or life-threatening deterioration. Critical care was time spent personally by me on the following activities: development of treatment plan with patient and/or surrogate as well as nursing, discussions with consultants, evaluation of patient's response to treatment, examination of patient, obtaining history from patient or surrogate, ordering and performing treatments and interventions, ordering and review of laboratory studies, ordering and review of radiographic studies, pulse oximetry and re-evaluation of patient's condition.    MDM   Final diagnoses:  Altered mental status, unspecified altered mental status type  Cerebral infarction due to unspecified mechanism    Patient presented with altered mental status abnormal gait slurred speech. According to friend that was with patient onset was acutely at 4 in the morning. Normally they're up at 3 in the morning as a "inconvenience stored for. When not customer started coming in the stored 6 in the morning they noted that the patient was definitely not his normal self. Brought in for evaluation. Additional story later came to light that patient may complaining of right wrist pain starting last evening. Patient does have a benzodiazepine for nerves at home may have taken some during the night but this is not known. Patient is on a blood thinner did take that before going to bed. He is on that for atrial flutter. Patient initially treated as a code stroke.  Head CT was negative. Discussed with neural hospital as early on. Obviously patient not a candidate for TPA because of being on  the blood thinner. However could be a candidate for interventional radiology so CTA head and neck was ordered. These have been read by the neuro hospitalists as normal.  It is possible that patient's drowsiness abnormal gait and slurred speech could be related to the benzodiazepine that he took. However patient does have risk factors of atrial flutter for strokes. Her hospitals recommends a internal medicine admission they will consult.  Discussed with the internist hospitalist Dr. Sherral Hammers he will admit they will get MRI. Neurology will consult.   In addition, patient will get x-ray of his right wrist and chest x-ray. Patient being so drowsy sats were about 9091% some patient started on 2 L nasal cannula oxygen. Patient will wake up and follow commands. No concern for airway compromise. But patient goes back to sleep.    Fredia Sorrow, MD 09/27/15 1000

## 2015-09-27 NOTE — H&P (Signed)
[facility] History and Physical                                                                                    Brian [name], is a 65 y.o. male  MRN: [id number]   DOB - 09/18/1950  Admit Date - 09/27/2015  Outpatient Primary MD: Elease Hashimoto, MD Cardiology: Mare Ferrari  With History of -  Past Medical History  Diagnosis Date  . Anxiety   . Atrial flutter ([place])     a. 08/2012  . Diastolic CHF ([place])     a. 08/2012, in setting of aflutter;  b. 08/2012 Echo: EF 55-60%.  . Obesity   . Tobacco abuse   . Shortness of breath       Past Surgical History  Procedure Laterality Date  . Microlaryngoscopy  09/02/2007    with excision of right vocal cord mass, Dr. Constance Holster    in for   Chief Complaint  Patient presents with  . Code Stroke     HPI  Brian [name]  is a 65 y.o. male with pmh of aflutter noncompliant with Xarelto, chronic diastolic heart failure with admission 10/2013 for acute decompensation, tobacco abuse, gout, obesity and anxiety. Pt presented to [facility] this am with reports of slurred speech. Pt states he was unable to sleep last night due to right hand and wrist pain. He took Nyquil and his roommates Clonazepam (uncertain dose) prior to going to bed.  Pt's roommate awoke him at 3am today for work. Roommate states that around 4:30am he noticed pt had slurred speech prompting visit to ED. Slurred speech persisted in ED, but is now resolved. Pt's only complaint is right hand and wrist pain. Given symptoms and pt's medical hx, there was concern for stroke. Pt has been admitted for further evaluation and treatment.   Pt states he does not take his Xarelto regularly due to cost of medication. He also admits he is noncompliant with Diltiazem and Lasix. He denies drug use, but UDS +cocaine and opiates. CT head, CT angio head and neck are unremarkable. Neurology called by EDP to evaluate pt. Pt denies recent headache, dizziness, chest pain, sob, abominal pain, n/v/d, or  weakness.   Review of Systems   In addition to the HPI above,  No Fever-chills, No Headache, No changes with Vision or hearing, No problems swallowing food or Liquids, No Chest pain, Cough or Shortness of Breath, No Abdominal pain, No Nausea or Vomiting, Bowel movements are regular, No Blood in stool or Urine, No dysuria, No new skin rashes or bruises, No new joints pains-aches,  No new weakness, tingling, numbness in any extremity, No recent weight gain or loss, A full 10 point Review of Systems was done, except as stated above, all other Review of Systems were negative.  Social History Social History  Substance Use Topics  . Smoking status: Former Smoker -- 1.00 packs/day for 50 years    Types: Cigarettes  . Smokeless tobacco: Never Used     Comment: quit 11/04/12  . Alcohol Use: Yes     Comment: socially  Works as Freight forwarder at [employer].   Family History Family History  Problem Relation  Age of Onset  . Cancer Mother   . Heart disease Father   . Heart attack Father   . Cancer Father     Prior to Admission medications   Medication Sig Start Date End Date Taking? Authorizing Provider  diltiazem (CARDIZEM CD) 180 MG 24 hr capsule Take 1 capsule (180 mg total) by mouth daily. 04/24/15  Yes Isaiah Serge, NP  rivaroxaban (XARELTO) 20 MG TABS tablet Take 1 tablet (20 mg total) by mouth daily with supper. 04/24/15  Yes Isaiah Serge, NP    No Known Allergies  Physical Exam  Vitals  Blood pressure 146/59, pulse 78, temperature 98.6 F (37 C), temperature source Oral, resp. rate 18, height 5\' 10"  (1.778 m), weight 95.255 kg (210 lb), SpO2 94 %.   General:  Sitting upright in bed, awake and alert in NAD,   Psych:  Normal affect and insight, Not Suicidal or Homicidal, Awake Alert, Oriented X 3.  Neuro:   No F.N deficits, ALL C.Nerves Intact, Strength 5/5 all 4 extremities, Sensation intact all 4 extremities.  ENT:  Ears and Eyes appear Normal, Conjunctivae  clear, PER. Moist oral mucosa without erythema or exudates.  Neck:  Supple, No lymphadenopathy appreciated  Respiratory:  Symmetrical chest wall movement, diminished bilateral lower lobes, no w/r/c  Cardiac:  RRR, No Murmurs, no LE edema noted, no JVD.    Abdomen:  Positive bowel sounds, Soft, Non tender, Non distended,  No masses appreciated  Skin:  No Cyanosis, Normal Skin Turgor, No Skin Rash or Bruise.  Extremities:  Able to move all 4. 5/5 strength in each,  no effusions.  Data Review  CBC  Recent Labs Lab 09/27/15 0745  WBC 14.8*  HGB 14.2  HCT 43.1  PLT 235  MCV 92.7  MCH 30.5  MCHC 32.9  RDW 13.6  LYMPHSABS 1.2  MONOABS 1.3*  EOSABS 0.3  BASOSABS 0.1    Chemistries   Recent Labs Lab 09/27/15 0745  NA 138  K 4.8  CL 107  CO2 25  GLUCOSE 127*  BUN 15  CREATININE 0.71  CALCIUM 8.6*  AST 23  ALT 24  ALKPHOS 55  BILITOT 0.6    estimated creatinine clearance is 108.1 mL/min (by C-G formula based on Cr of 0.71).  No results for input(s): TSH, T4TOTAL, T3FREE, THYROIDAB in the last 72 hours.  Invalid input(s): FREET3  Coagulation profile  Recent Labs Lab 09/27/15 0745  INR 1.15    No results for input(s): DDIMER in the last 72 hours.  Cardiac Enzymes  Recent Labs Lab 09/27/15 0745  TROPONINI <0.03    Invalid input(s): POCBNP  Urinalysis    Component Value Date/Time   COLORURINE YELLOW 09/27/2015 1050   APPEARANCEUR CLEAR 09/27/2015 1050   LABSPEC >1.046* 09/27/2015 1050   PHURINE 6.0 09/27/2015 1050   GLUCOSEU NEGATIVE 09/27/2015 1050   HGBUR NEGATIVE 09/27/2015 1050   BILIRUBINUR NEGATIVE 09/27/2015 1050   KETONESUR NEGATIVE 09/27/2015 1050   PROTEINUR NEGATIVE 09/27/2015 1050   UROBILINOGEN 0.2 09/05/2012 0839   NITRITE NEGATIVE 09/27/2015 1050   LEUKOCYTESUR NEGATIVE 09/27/2015 1050    Imaging results:   Ct Angio Head W/cm &/or Wo Cm  09/27/2015  CLINICAL DATA:  Altered mental status, slurred speech, and right hand  numbness. EXAM: CT ANGIOGRAPHY HEAD AND NECK TECHNIQUE: Multidetector CT imaging of the head and neck was performed using the standard protocol during bolus administration of intravenous contrast. Multiplanar CT image reconstructions and MIPs were obtained to evaluate the  vascular anatomy. Carotid stenosis measurements (when applicable) are obtained utilizing NASCET criteria, using the distal internal carotid diameter as the denominator. CONTRAST:  75 cc Isovue 370 intravenous. COMPARISON:  Head CT from earlier the same day FINDINGS: CTA NECK Aortic arch: Atheromatous calcification and thickening of the wall without aneurysm or dissection. Three vessel branching Right carotid system: Degraded by motion but best obtainable given condition of patient. Noncalcified plaque on the distal common carotid wall. Mild mainly calcified plaque at the carotid bifurcation. No stenosis, dissection, or beading. Left carotid system: Motion degradation as noted above. Atheromatous wall thickening, greatest at the proximal ICA where there is a calcified plaque narrowing the lumen by ~30%. Below this mild stenosis is an area of curvilinear low-density in the lumen that is likely motion and flow artifact, not discrete enough to represent carotid web. No flow limiting stenosis, dissection, ulcerated plaque, or vessel beading. Vertebral arteries:No proximal subclavian artery stenosis. Motion degradation intermittently at the level of the vertebral arteries. Negative for stenosis (but particularly limited at the left vertebral artery ostium due to soft tissue attenuation and streak artifact) or dissection Skeleton: Spondylosis and facet arthropathy. Other neck: No incidental mass or adenopathy in the neck. CTA HEAD Motion degraded above the level of the circle-of-Willis. Anterior circulation: Intact circle-of-Willis with small communicating arteries. The posterior communicating arteries have small infundibula. Symmetric carotid arteries  with atheromatous calcification on the carotid siphons but no stenosis. The A1 and M1 segments are widely patent. No major branch occlusion is seen. No vessel beading or flow limiting stenosis. Negative for aneurysm. Posterior circulation: Symmetric vertebral arteries and vertebrobasilar branching. Smooth and widely patent vertebral, basilar, and posterior cerebral arteries. No evidence of aneurysm or vascular malformation. Venous sinuses: Patent Anatomic variants: None Delayed phase: No parenchymal enhancement or mass lesion identified. IMPRESSION: 1. No acute arterial finding or flow limiting stenosis. 2. Cervical and intracranial atherosclerosis as described. 3. Intermittently motion degraded. Electronically Signed   By: Monte Fantasia M.D.   On: 09/27/2015 09:12   Dg Chest 2 View  09/27/2015  CLINICAL DATA:  Slurred speech and altered mental status EXAM: CHEST  2 VIEW COMPARISON:  Chest CT August 07, 2014; chest radiograph Nov 17, 2014 FINDINGS: There is slight scarring in the left base. The lungs elsewhere are clear. Heart size and pulmonary vascularity are normal. No adenopathy. No bone lesions. IMPRESSION: Mild scarring left base. Lungs elsewhere clear. Cardiac silhouette within normal limits. Electronically Signed   By: Lowella Grip III M.D.   On: 09/27/2015 10:47   Dg Wrist Complete Right  09/27/2015  CLINICAL DATA:  Right wrist pain and swelling EXAM: RIGHT WRIST - COMPLETE 3+ VIEW COMPARISON:  04/23/2015 FINDINGS: Four views of the right wrist submitted. No acute fracture or subluxation. Mild degenerative narrowing of radiocarpal joint. Minimal degenerative changes first carpometacarpal joint. IMPRESSION: No acute fracture or subluxation.  Mild degenerative changes. Electronically Signed   By: Lahoma Crocker M.D.   On: 09/27/2015 10:45   Ct Head Wo Contrast  09/27/2015  CLINICAL DATA:  Slurred speech, altered mental status EXAM: CT HEAD WITHOUT CONTRAST TECHNIQUE: Contiguous axial images were  obtained from the base of the skull through the vertex without intravenous contrast. COMPARISON:  04/17/2014 FINDINGS: Brain: No intracranial hemorrhage, mass effect or midline shift. Mild cerebral atrophy. Ventricular size is stable from prior exam. There are motion artifacts. No definite acute cortical infarction. No mass lesion is noted on this unenhanced scan. No intra or extra-axial fluid collection. Vascular: Mild  atherosclerotic calcifications of carotid siphon. Skull: No skull fracture.  No destructive bony lesions. Sinuses/Orbits: No acute findings. Other: None. IMPRESSION: No acute intracranial abnormality. No definite acute cortical infarction. Mild cerebral atrophy. There are motion artifacts. Electronically Signed   By: Lahoma Crocker M.D.   On: 09/27/2015 08:19   Ct Angio Neck W/cm &/or Wo/cm  09/27/2015  CLINICAL DATA:  Altered mental status, slurred speech, and right hand numbness. EXAM: CT ANGIOGRAPHY HEAD AND NECK TECHNIQUE: Multidetector CT imaging of the head and neck was performed using the standard protocol during bolus administration of intravenous contrast. Multiplanar CT image reconstructions and MIPs were obtained to evaluate the vascular anatomy. Carotid stenosis measurements (when applicable) are obtained utilizing NASCET criteria, using the distal internal carotid diameter as the denominator. CONTRAST:  75 cc Isovue 370 intravenous. COMPARISON:  Head CT from earlier the same day FINDINGS: CTA NECK Aortic arch: Atheromatous calcification and thickening of the wall without aneurysm or dissection. Three vessel branching Right carotid system: Degraded by motion but best obtainable given condition of patient. Noncalcified plaque on the distal common carotid wall. Mild mainly calcified plaque at the carotid bifurcation. No stenosis, dissection, or beading. Left carotid system: Motion degradation as noted above. Atheromatous wall thickening, greatest at the proximal ICA where there is a  calcified plaque narrowing the lumen by ~30%. Below this mild stenosis is an area of curvilinear low-density in the lumen that is likely motion and flow artifact, not discrete enough to represent carotid web. No flow limiting stenosis, dissection, ulcerated plaque, or vessel beading. Vertebral arteries:No proximal subclavian artery stenosis. Motion degradation intermittently at the level of the vertebral arteries. Negative for stenosis (but particularly limited at the left vertebral artery ostium due to soft tissue attenuation and streak artifact) or dissection Skeleton: Spondylosis and facet arthropathy. Other neck: No incidental mass or adenopathy in the neck. CTA HEAD Motion degraded above the level of the circle-of-Willis. Anterior circulation: Intact circle-of-Willis with small communicating arteries. The posterior communicating arteries have small infundibula. Symmetric carotid arteries with atheromatous calcification on the carotid siphons but no stenosis. The A1 and M1 segments are widely patent. No major branch occlusion is seen. No vessel beading or flow limiting stenosis. Negative for aneurysm. Posterior circulation: Symmetric vertebral arteries and vertebrobasilar branching. Smooth and widely patent vertebral, basilar, and posterior cerebral arteries. No evidence of aneurysm or vascular malformation. Venous sinuses: Patent Anatomic variants: None Delayed phase: No parenchymal enhancement or mass lesion identified. IMPRESSION: 1. No acute arterial finding or flow limiting stenosis. 2. Cervical and intracranial atherosclerosis as described. 3. Intermittently motion degraded. Electronically Signed   By: Monte Fantasia M.D.   On: 09/27/2015 09:12    My personal review of EKG: NSR, No ST changes noted.   Assessment & Plan  Active Problems:   Atrial flutter ([facility])   Noncompliance   HTN (hypertension)   Chronic diastolic heart failure ([facility])   CVA (cerebral infarction)   Right hand pain   History  of gout   Cocaine abuse   TIA (transient ischemic attack)   Slurred speech   Primary osteoarthritis of right hand   Leukocytosis   Essential hypertension   Chronic diastolic CHF (congestive heart failure) ([facility])  Slurred speech -?TIA/CVA vs medication side effect with Clonazepam and Nyquil. Symptoms have now resolved. He certainly has risk factors for stroke.  -CT head, CT angio head and neck are unremarkable -2Decho, monitor on tele, will defer MRI to neurology  Right hand pain/OA -pt reports hx of  gout in shoulder. Will check a uric acid level -cannot r/o septic joint as pt does have mildly elevated WBC count. Consider CT. No findings on plain film. Pt is afebrile and NT appearing so will hold antibiotics for further workup vs osteoarthritis  Leukocytosis -WBC 14.8. Afebrile, NT appearing. U/A and chest xray unremarkable -possible related to right hand pain, see above -monitor trend  Atrial Flutter -currently NSR.  -Pt noncompliant with diltiazem and xarelto [place] 2 -Will order home meds while inpt. Monitor on tele.  Essential hypertension -controlled now. He has been off his ACEI for quite some time per cardiology notes. -Will order Diltiazem  Chronic diastolic HF -volume stable on exam. -continue lasix -2Decho (last done 10/2013 showed EF 99991111, grade I diastolic dysfunction)  Cocaine abuse -pt denies. UDS positive cocaine and opiates  DVT Prophylaxis: Xarelto  AM Labs Ordered, also please review Full Orders  Family Communication:   Sister at bedside on admission  Code Status:  full  Condition:  stable  Time spent in minutes : [place], NP on 09/27/2015 at 8:25 PM Between 7am to 7pm - Pager - [phone] After 7pm go to [email or web address] - password [email or web address]  And look for the night coverage person covering me after hours  [place] during the described time interval was provided by me and NP Patrici Ranks  . I have reviewed  this patient's available data, including medical history, events of note, physical examination, and all test results as part of my evaluation. I have personally reviewed and interpreted all radiology studies.

## 2015-09-27 NOTE — Progress Notes (Signed)
Patient arrived from [facility] via [facility]. Patient is alert, oriented x4, answers questions appropriately. Skin is intact. Patient states he is hungry, needs to eat now, speech aware. Patient states he has pain in right arm, arm repositioned. Patient was oriented to room, unit, staff. MD paged at 12:30. Will continue to monitor.

## 2015-09-28 ENCOUNTER — Observation Stay (HOSPITAL_BASED_OUTPATIENT_CLINIC_OR_DEPARTMENT_OTHER): Payer: BLUE CROSS/BLUE SHIELD

## 2015-09-28 DIAGNOSIS — Z8639 Personal history of other endocrine, nutritional and metabolic disease: Secondary | ICD-10-CM

## 2015-09-28 DIAGNOSIS — F1410 Cocaine abuse, uncomplicated: Secondary | ICD-10-CM

## 2015-09-28 DIAGNOSIS — I4892 Unspecified atrial flutter: Secondary | ICD-10-CM

## 2015-09-28 DIAGNOSIS — M19041 Primary osteoarthritis, right hand: Secondary | ICD-10-CM | POA: Diagnosis not present

## 2015-09-28 DIAGNOSIS — Z9119 Patient's noncompliance with other medical treatment and regimen: Secondary | ICD-10-CM

## 2015-09-28 DIAGNOSIS — I5032 Chronic diastolic (congestive) heart failure: Secondary | ICD-10-CM

## 2015-09-28 DIAGNOSIS — G459 Transient cerebral ischemic attack, unspecified: Secondary | ICD-10-CM

## 2015-09-28 DIAGNOSIS — I10 Essential (primary) hypertension: Secondary | ICD-10-CM

## 2015-09-28 LAB — LIPID PANEL
CHOLESTEROL: 135 mg/dL (ref 0–200)
HDL: 34 mg/dL — ABNORMAL LOW (ref >40)
LDL Cholesterol: 76 mg/dL (ref 0–99)
TRIGLYCERIDES: 126 mg/dL (ref <150)
Total CHOL/HDL Ratio: 4 RATIO
VLDL: 25 mg/dL (ref 0–40)

## 2015-09-28 LAB — BASIC METABOLIC PANEL
Anion gap: 5 (ref 5–15)
BUN: 11 mg/dL (ref 6–20)
CALCIUM: 8.2 mg/dL — AB (ref 8.9–10.3)
CO2: 28 mmol/L (ref 22–32)
CREATININE: 0.71 mg/dL (ref 0.61–1.24)
Chloride: 107 mmol/L (ref 101–111)
Glucose, Bld: 117 mg/dL — ABNORMAL HIGH (ref 65–99)
Potassium: 4.4 mmol/L (ref 3.5–5.1)
SODIUM: 140 mmol/L (ref 135–145)

## 2015-09-28 LAB — CBC
HCT: 42.2 % (ref 39.0–52.0)
HEMOGLOBIN: 13.3 g/dL (ref 13.0–17.0)
MCH: 29.4 pg (ref 26.0–34.0)
MCHC: 31.5 g/dL (ref 30.0–36.0)
MCV: 93.4 fL (ref 78.0–100.0)
PLATELETS: 231 10*3/uL (ref 150–400)
RBC: 4.52 MIL/uL (ref 4.22–5.81)
RDW: 13.6 % (ref 11.5–15.5)
WBC: 9.8 10*3/uL (ref 4.0–10.5)

## 2015-09-28 LAB — ECHOCARDIOGRAM COMPLETE
HEIGHTINCHES: 70 in
WEIGHTICAEL: 3360 [oz_av]

## 2015-09-28 MED ORDER — NAPROXEN 250 MG PO TABS
500.0000 mg | ORAL_TABLET | Freq: Three times a day (TID) | ORAL | Status: DC
  Administered 2015-09-28: 500 mg via ORAL
  Filled 2015-09-28: qty 2

## 2015-09-28 MED ORDER — NAPROXEN 500 MG PO TABS: 500.0000 mg | ORAL_TABLET | Freq: Two times a day (BID) | ORAL | Status: DC | PRN

## 2015-09-28 MED ORDER — PREDNISONE 20 MG PO TABS
40.0000 mg | ORAL_TABLET | Freq: Every day | ORAL | Status: DC
  Administered 2015-09-28: 40 mg via ORAL
  Filled 2015-09-28: qty 2

## 2015-09-28 MED ORDER — PREDNISONE 10 MG PO TABS: ORAL_TABLET | ORAL | Status: DC

## 2015-09-28 MED ORDER — DILTIAZEM HCL ER COATED BEADS 180 MG PO CP24
180.0000 mg | ORAL_CAPSULE | Freq: Every day | ORAL | Status: DC
  Administered 2015-09-28: 180 mg via ORAL
  Filled 2015-09-28: qty 1

## 2015-09-28 NOTE — Consult Note (Signed)
ORTHOPAEDIC CONSULTATION HISTORY & PHYSICAL REQUESTING PHYSICIAN: Lavina Hamman, MD  Chief Complaint: right hand/wrist pain  HPI: Brian [name] is a 65 y.o. male who was admitted yesterday with slurred speech and right hand/wrist pain. He has undergone neurological evaluation, and had an MRI scan of his right wrist, revealing dorsal wrist tenosynovitis and joint effusion.    He reports no h/o trauma, no known h/o crystalline or inflammatory arthropathy.  He has been afebrile and his WBC is 9.8  He reports he suspected in the past he might have gout because he would have episodic shoulder pain/inflammation, but denies any in hands or feet.  Past Medical History  Diagnosis Date  . Anxiety   . Atrial flutter ([place])     a. 08/2012  . Diastolic CHF ([place])     a. 08/2012, in setting of aflutter;  b. 08/2012 Echo: EF 55-60%.  . Obesity   . Tobacco abuse   . Shortness of breath    Past Surgical History  Procedure Laterality Date  . Microlaryngoscopy  09/02/2007    with excision of right vocal cord mass, Dr. Constance Holster   Social History   Social History  . Marital Status: Divorced    Spouse Name: N/A  . Number of Children: N/A  . Years of Education: N/A   Social History Main Topics  . Smoking status: Former Smoker -- 1.00 packs/day for 50 years    Types: Cigarettes  . Smokeless tobacco: Never Used     Comment: quit 11/04/12  . Alcohol Use: Yes     Comment: socially  . Drug Use: No  . Sexual Activity: Not Currently   Other Topics Concern  . None   Social History Narrative   Family History  Problem Relation Age of Onset  . Cancer Mother   . Heart disease Father   . Heart attack Father   . Cancer Father    No Known Allergies Prior to Admission medications   Medication Sig Start Date End Date Taking? Authorizing Provider  aspirin EC 81 MG tablet Take 81 mg by mouth daily.   Yes Historical Provider, MD  diltiazem (CARDIZEM CD) 180 MG 24 hr capsule Take 1 capsule (180 mg total) by  mouth daily. 04/24/15  Yes Isaiah Serge, NP  furosemide (LASIX) 20 MG tablet Take 10 mg by mouth daily.   Yes Historical Provider, MD  rivaroxaban (XARELTO) 20 MG TABS tablet Take 1 tablet (20 mg total) by mouth daily with supper. 04/24/15  Yes Isaiah Serge, NP  naproxen (NAPROSYN) 500 MG tablet Take 1 tablet (500 mg total) by mouth 2 (two) times daily between meals as needed for moderate pain. 09/28/15   Lavina Hamman, MD  predniSONE (DELTASONE) 10 MG tablet Take 40mg  daily for 3days,Take 30mg  daily for 3days,Take 20mg  daily for 3days,Take 10mg  daily for 3days, then stop 09/28/15   Lavina Hamman, MD   Ct Angio Head W/cm &/or Wo Cm  09/27/2015  CLINICAL DATA:  Altered mental status, slurred speech, and right hand numbness. EXAM: CT ANGIOGRAPHY HEAD AND NECK TECHNIQUE: Multidetector CT imaging of the head and neck was performed using the standard protocol during bolus administration of intravenous contrast. Multiplanar CT image reconstructions and MIPs were obtained to evaluate the vascular anatomy. Carotid stenosis measurements (when applicable) are obtained utilizing NASCET criteria, using the distal internal carotid diameter as the denominator. CONTRAST:  75 cc Isovue 370 intravenous. COMPARISON:  Head CT from earlier the same day FINDINGS: CTA  NECK Aortic arch: Atheromatous calcification and thickening of the wall without aneurysm or dissection. Three vessel branching Right carotid system: Degraded by motion but best obtainable given condition of patient. Noncalcified plaque on the distal common carotid wall. Mild mainly calcified plaque at the carotid bifurcation. No stenosis, dissection, or beading. Left carotid system: Motion degradation as noted above. Atheromatous wall thickening, greatest at the proximal ICA where there is a calcified plaque narrowing the lumen by ~30%. Below this mild stenosis is an area of curvilinear low-density in the lumen that is likely motion and flow artifact, not discrete  enough to represent carotid web. No flow limiting stenosis, dissection, ulcerated plaque, or vessel beading. Vertebral arteries:No proximal subclavian artery stenosis. Motion degradation intermittently at the level of the vertebral arteries. Negative for stenosis (but particularly limited at the left vertebral artery ostium due to soft tissue attenuation and streak artifact) or dissection Skeleton: Spondylosis and facet arthropathy. Other neck: No incidental mass or adenopathy in the neck. CTA HEAD Motion degraded above the level of the circle-of-Willis. Anterior circulation: Intact circle-of-Willis with small communicating arteries. The posterior communicating arteries have small infundibula. Symmetric carotid arteries with atheromatous calcification on the carotid siphons but no stenosis. The A1 and M1 segments are widely patent. No major branch occlusion is seen. No vessel beading or flow limiting stenosis. Negative for aneurysm. Posterior circulation: Symmetric vertebral arteries and vertebrobasilar branching. Smooth and widely patent vertebral, basilar, and posterior cerebral arteries. No evidence of aneurysm or vascular malformation. Venous sinuses: Patent Anatomic variants: None Delayed phase: No parenchymal enhancement or mass lesion identified. IMPRESSION: 1. No acute arterial finding or flow limiting stenosis. 2. Cervical and intracranial atherosclerosis as described. 3. Intermittently motion degraded. Electronically Signed   By: Monte Fantasia M.D.   On: 09/27/2015 09:12   Dg Chest 2 View  09/27/2015  CLINICAL DATA:  Slurred speech and altered mental status EXAM: CHEST  2 VIEW COMPARISON:  Chest CT August 07, 2014; chest radiograph Nov 17, 2014 FINDINGS: There is slight scarring in the left base. The lungs elsewhere are clear. Heart size and pulmonary vascularity are normal. No adenopathy. No bone lesions. IMPRESSION: Mild scarring left base. Lungs elsewhere clear. Cardiac silhouette within normal  limits. Electronically Signed   By: Lowella Grip III M.D.   On: 09/27/2015 10:47   Dg Wrist Complete Right  09/27/2015  CLINICAL DATA:  Right wrist pain and swelling EXAM: RIGHT WRIST - COMPLETE 3+ VIEW COMPARISON:  04/23/2015 FINDINGS: Four views of the right wrist submitted. No acute fracture or subluxation. Mild degenerative narrowing of radiocarpal joint. Minimal degenerative changes first carpometacarpal joint. IMPRESSION: No acute fracture or subluxation.  Mild degenerative changes. Electronically Signed   By: Lahoma Crocker M.D.   On: 09/27/2015 10:45   Ct Head Wo Contrast  09/27/2015  CLINICAL DATA:  Slurred speech, altered mental status EXAM: CT HEAD WITHOUT CONTRAST TECHNIQUE: Contiguous axial images were obtained from the base of the skull through the vertex without intravenous contrast. COMPARISON:  04/17/2014 FINDINGS: Brain: No intracranial hemorrhage, mass effect or midline shift. Mild cerebral atrophy. Ventricular size is stable from prior exam. There are motion artifacts. No definite acute cortical infarction. No mass lesion is noted on this unenhanced scan. No intra or extra-axial fluid collection. Vascular: Mild atherosclerotic calcifications of carotid siphon. Skull: No skull fracture.  No destructive bony lesions. Sinuses/Orbits: No acute findings. Other: None. IMPRESSION: No acute intracranial abnormality. No definite acute cortical infarction. Mild cerebral atrophy. There are motion artifacts. Electronically Signed  By: Lahoma Crocker M.D.   On: 09/27/2015 08:19   Ct Angio Neck W/cm &/or Wo/cm  09/27/2015  CLINICAL DATA:  Altered mental status, slurred speech, and right hand numbness. EXAM: CT ANGIOGRAPHY HEAD AND NECK TECHNIQUE: Multidetector CT imaging of the head and neck was performed using the standard protocol during bolus administration of intravenous contrast. Multiplanar CT image reconstructions and MIPs were obtained to evaluate the vascular anatomy. Carotid stenosis  measurements (when applicable) are obtained utilizing NASCET criteria, using the distal internal carotid diameter as the denominator. CONTRAST:  75 cc Isovue 370 intravenous. COMPARISON:  Head CT from earlier the same day FINDINGS: CTA NECK Aortic arch: Atheromatous calcification and thickening of the wall without aneurysm or dissection. Three vessel branching Right carotid system: Degraded by motion but best obtainable given condition of patient. Noncalcified plaque on the distal common carotid wall. Mild mainly calcified plaque at the carotid bifurcation. No stenosis, dissection, or beading. Left carotid system: Motion degradation as noted above. Atheromatous wall thickening, greatest at the proximal ICA where there is a calcified plaque narrowing the lumen by ~30%. Below this mild stenosis is an area of curvilinear low-density in the lumen that is likely motion and flow artifact, not discrete enough to represent carotid web. No flow limiting stenosis, dissection, ulcerated plaque, or vessel beading. Vertebral arteries:No proximal subclavian artery stenosis. Motion degradation intermittently at the level of the vertebral arteries. Negative for stenosis (but particularly limited at the left vertebral artery ostium due to soft tissue attenuation and streak artifact) or dissection Skeleton: Spondylosis and facet arthropathy. Other neck: No incidental mass or adenopathy in the neck. CTA HEAD Motion degraded above the level of the circle-of-Willis. Anterior circulation: Intact circle-of-Willis with small communicating arteries. The posterior communicating arteries have small infundibula. Symmetric carotid arteries with atheromatous calcification on the carotid siphons but no stenosis. The A1 and M1 segments are widely patent. No major branch occlusion is seen. No vessel beading or flow limiting stenosis. Negative for aneurysm. Posterior circulation: Symmetric vertebral arteries and vertebrobasilar branching. Smooth and  widely patent vertebral, basilar, and posterior cerebral arteries. No evidence of aneurysm or vascular malformation. Venous sinuses: Patent Anatomic variants: None Delayed phase: No parenchymal enhancement or mass lesion identified. IMPRESSION: 1. No acute arterial finding or flow limiting stenosis. 2. Cervical and intracranial atherosclerosis as described. 3. Intermittently motion degraded. Electronically Signed   By: Monte Fantasia M.D.   On: 09/27/2015 09:12   Mr Brain Wo Contrast  09/27/2015  CLINICAL DATA:  Slurred speech beginning at 4:30 a.m., now resolved. Patient took medication for RIGHT extremity pain. History of atrial fibrillation, tobacco abuse, CHF. EXAM: MRI HEAD WITHOUT CONTRAST TECHNIQUE: Multiplanar, multiecho pulse sequences of the brain and surrounding structures were obtained without intravenous contrast. Coronal T2 imaging not performed as patient was unable to tolerate further imaging. COMPARISON:  CT head September 27, 2015 at 0811 hours FINDINGS: Multiple sequences are mildly motion degraded. The ventricles and sulci are normal for age. No abnormal parenchymal signal, mass lesions, mass effect. No reduced diffusion to suggest acute ischemia. A few scattered subcentimeter white matter FLAIR T2 hyperintensities most compatible with chronic small ischemic, within normal range for patient's age. No susceptibility artifact to suggest hemorrhage. No abnormal extra-axial fluid collections. No extra-axial masses though, contrast enhanced sequences would be more sensitive. Normal major intracranial vascular flow voids seen at the skull base. Ocular globes and orbital contents are unremarkable though not tailored for evaluation. No abnormal sellar expansion. No suspicious calvarial bone marrow  signal. Craniocervical junction maintained. Visualized paranasal sinuses and mastoid air cells are well-aerated. Patient is edentulous. IMPRESSION: Negative mildly motion degraded noncontrast MRI brain for age.  Electronically Signed   By: Elon Alas M.D.   On: 09/27/2015 23:12   Mr Wrist Right Wo Contrast  09/28/2015  CLINICAL DATA:  Right hand and wrist pain. No known injury. Slurred speech. Possible stroke. EXAM: MR OF THE RIGHT WRIST WITHOUT CONTRAST TECHNIQUE: Multiplanar, multisequence MR imaging of the right wrist was performed. No intravenous contrast was administered. COMPARISON:  Radiographs 09/27/2015 FINDINGS: Examination is very limited due to patient motion. No acute or gross bony abnormality is identified. No obvious marrow edema on the T2 weighted images. The major dorsal and volar wrist tendons appear intact. Mild tenosynovitis involving the dorsal tendons and small amount of fluid in the carpal tunnel. There is a moderate-sized joint effusion and also a moderate effusion at the radioulnar joint. No obvious erosions. IMPRESSION: 1. Very limited examination due to patient motion. 2. No obvious acute bony abnormality. The intercarpal joint spaces appear relatively well maintained. 3. Moderate-sized wrist joint effusion and suspect tenosynovitis. Possible inflammatory arthropathy. No obvious erosions. Joint aspiration may be helpful. Electronically Signed   By: Marijo Sanes M.D.   On: 09/28/2015 08:23    Positive ROS: All other systems have been reviewed and were otherwise negative with the exception of those mentioned in the HPI and as above.  Physical Exam: Vitals: Refer to EMR. Constitutional:  WD, WN, NAD HEENT:  NCAT, EOMI Neuro/Psych:  Alert & oriented to person, place, and time; appropriate mood & affect Lymphatic: No generalized extremity edema or lymphadenopathy Extremities / MSK:  The extremities are normal with respect to appearance, ranges of motion, joint stability, muscle strength/tone, sensation, & perfusion except as otherwise noted:  Right wrist mildly swollen.  There is pain with active and passive flexion-extension of the wrist, whether the digits are flexed and  extended.  Digital flexion is limited by dorsal hand and wrist pain as the fingers are brought towards full extension.  He stops fingertips about 4 cm from home.  He also has pain with palpation over the extensor tendons of the hand and wrist level.  No redness, no fluctuance  Assessment: Right wrist effusion with dorsal tenosynovitis, suspected to be inflammatory or crystalline arthropathy.  Infection is a possibility, but of low likelihood.  Plan: I discussed with him the diagnostic possibilities, and recommended joint aspiration.  After prepping the skin with alcohol, this was done to the standard 3-4 portal with an 18-gauge needle.  1-2 mL's of joint fluid was extracted, which was thick, viscous, mostly clear, with a few pieces of particular matter within it.  It did not appear to be pus.  A Band-Aid was applied and the fluid will be sent for culture and crystal analysis. I previously discussed with his primary team a plan that included treatment with anti-inflammatories since the likelihood of infection is extremely low.  I think it is fine for him to be discharged today with that plan, and I will follow-up on the labs to ensure that no other additional treatment is indicated.  Brian [name], [address] [place], [place]  [address] Office: [phone] Mobile: [phone]  09/28/2015, 1:39 PM

## 2015-09-28 NOTE — Progress Notes (Signed)
Pt discharged home with family, by car, assessment stable, all questions answered. IV removed. Telemetry removed. Pt taken by wheelchair to exit. Time of discharge: 1500

## 2015-09-28 NOTE — Discharge Instructions (Signed)
Heart Failure  Heart failure means your heart has trouble pumping blood. This makes it hard for your body to work well. Heart failure is usually a long-term (chronic) condition. You must take good care of yourself and follow your doctor's treatment plan.  HOME CARE   Take your heart medicine as told by your doctor.    Do not stop taking medicine unless your doctor tells you to.    Do not skip any dose of medicine.    Refill your medicines before they run out.    Take other medicines only as told by your doctor or pharmacist.   Stay active if told by your doctor. The elderly and people with severe heart failure should talk with a doctor about physical activity.   Eat heart-healthy foods. Choose foods that are without trans fat and are low in saturated fat, cholesterol, and salt (sodium). This includes fresh or frozen fruits and vegetables, fish, lean meats, fat-free or low-fat dairy foods, whole grains, and high-fiber foods. Lentils and dried peas and beans (legumes) are also good choices.   Limit salt if told by your doctor.   Cook in a healthy way. Roast, grill, broil, bake, poach, steam, or stir-fry foods.   Limit fluids as told by your doctor.   Weigh yourself every morning. Do this after you pee (urinate) and before you eat breakfast. Write down your weight to give to your doctor.   Take your blood pressure and write it down if your doctor tells you to.   Ask your doctor how to check your pulse. Check your pulse as told.   Lose weight if told by your doctor.   Stop smoking or chewing tobacco. Do not use gum or patches that help you quit without your doctor's approval.   Schedule and go to doctor visits as told.   Nonpregnant women should have no more than 1 drink a day. Men should have no more than 2 drinks a day. Talk to your doctor about drinking alcohol.   Stop illegal drug use.   Stay current with shots (immunizations).   Manage your health conditions as told by your doctor.   Learn to  manage your stress.   Rest when you are tired.   If it is really hot outside:    Avoid intense activities.    Use air conditioning or fans, or get in a cooler place.    Avoid caffeine and alcohol.    Wear loose-fitting, lightweight, and light-colored clothing.   If it is really cold outside:    Avoid intense activities.    Layer your clothing.    Wear mittens or gloves, a hat, and a scarf when going outside.    Avoid alcohol.   Learn about heart failure and get support as needed.   Get help to maintain or improve your quality of life and your ability to care for yourself as needed.  GET HELP IF:    You gain weight quickly.   You are more short of breath than usual.   You cannot do your normal activities.   You tire easily.   You cough more than normal, especially with activity.   You have any or more puffiness (swelling) in areas such as your hands, feet, ankles, or belly (abdomen).   You cannot sleep because it is hard to breathe.   You feel like your heart is beating fast (palpitations).   You get dizzy or light-headed when you stand up.  GET HELP   RIGHT AWAY IF:    You have trouble breathing.   There is a change in mental status, such as becoming less alert or not being able to focus.   You have chest pain or discomfort.   You faint.  MAKE SURE YOU:    Understand these instructions.   Will watch your condition.   Will get help right away if you are not doing well or get worse.     This information is not intended to replace advice given to you by your health care provider. Make sure you discuss any questions you have with your health care provider.     Document Released: 03/24/2008 Document Revised: 07/06/2014 Document Reviewed: 08/01/2012  Elsevier Interactive Patient Education 2016 [employer]

## 2015-09-28 NOTE — Progress Notes (Signed)
  Echocardiogram 2D Echocardiogram has been performed.  Brian [name] 09/28/2015, 1:05 PM

## 2015-09-28 NOTE — Progress Notes (Signed)
SLP Cancellation Note  Patient Details Name: Brian [name] MRN: [id number] DOB: 1950-08-08   Cancelled treatment:       Reason Eval/Treat Not Completed: SLP screened, no needs identified. No acute infarct noted on MRI and pt believes his symptoms have resolved. No evidence of dysarthria noted this morning. SLP to sign off.   Germain Osgood, M.A. CCC-SLP [phone]  Germain Osgood 09/28/2015, 10:43 AM

## 2015-09-28 NOTE — Progress Notes (Signed)
Lab just called and said there was not enough fluid collected to do a culture and gram stain, and which one did the doctor want. rn called pager number, waiting for answer and then rn will call lab and tell them what the doctor chooses.

## 2015-09-28 NOTE — Progress Notes (Signed)
Occupational Therapy Evaluation/Discharge Patient Details Name: Brian [name] MRN: [id number] DOB: 1951-06-20 Today's Date: 09/28/2015    History of Present Illness 65 y.o. male admitted for R hand tingling and pain, abnormal gait and slurred speech. PMH significant for atrial flutter, diastolic CHF, SOB, obesity, gout, and anxiety.   Clinical Impression   Patient has been evaluated by Occupational Therapy with no acute OT needs identified. Pt completed all ADLs and mobility at modified independent level. Pt presents with pain and edema to R hand with resulting decreased ROM and strength, however believe once pt will return to PLOF once pain and swelling decrease. Educated pt to elevate R hand and use ice for inflammatory control. All education has been completed and pt has no further questions. OT signing off. Thank you for this referral.    Follow Up Recommendations  No OT follow up    Equipment Recommendations  None recommended by OT    Recommendations for Other Services       Precautions / Restrictions Precautions Precautions: None Restrictions Weight Bearing Restrictions: No      Mobility Bed Mobility               General bed mobility comments: Pt standing at sink brushing teeth on OT arrival  Transfers Overall transfer level: Modified independent                    Balance Overall balance assessment: No apparent balance deficits (not formally assessed)                                          ADL Overall ADL's : Modified independent                                       General ADL Comments: Pt able to use L hand for eating, grooming, dressing tasks until R hand gets better. Educated pt on pain/edema management strategies including elevation and ice packs.      Vision Vision Assessment?: Yes Eye Alignment: Within Functional Limits Ocular Range of Motion: Within Functional Limits Alignment/Gaze Preference:  Within Defined Limits Tracking/Visual Pursuits: Decreased smoothness of horizontal tracking;Decreased smoothness of vertical tracking Saccades: Within functional limits Convergence: Within functional limits Visual Fields: No apparent deficits Additional Comments: Nystagmus in both eyes with horizontal and vertical tracking   Perception Perception Perception Tested?: Yes Perception Deficits: Inattention/neglect Inattention/Neglect: Appears intact   Praxis      Pertinent Vitals/Pain Pain Assessment: 0-10 Pain Score: 4  Pain Location: R hand/wrist Pain Descriptors / Indicators: Aching;Sore Pain Intervention(s): Monitored during session;Repositioned     Hand Dominance Right   Extremity/Trunk Assessment Upper Extremity Assessment Upper Extremity Assessment: RUE deficits/detail RUE Deficits / Details: STRENGTH: weak grip due to pain and edema, all others wfl; AROM: limited composite hand and 3rd and 4th digit flexion due to pain/edema, SENSATION: intact RUE: Unable to fully assess due to pain RUE Coordination: decreased fine motor   Lower Extremity Assessment Lower Extremity Assessment: Overall WFL for tasks assessed   Cervical / Trunk Assessment Cervical / Trunk Assessment: Normal   Communication Communication Communication: No difficulties   Cognition Arousal/Alertness: Awake/alert Behavior During Therapy: WFL for tasks assessed/performed Overall Cognitive Status: Within Functional Limits for tasks assessed  General Comments       Exercises       Shoulder Instructions      Home Living Family/patient expects to be discharged to:: Private residence Living Arrangements: Non-relatives/Friends Available Help at Discharge: Friend(s);Family;Available PRN/intermittently Type of Home: House Home Access: Stairs to enter [employer] of Steps: 1 Entrance Stairs-Rails: None Home Layout: One level     Bathroom Shower/Tub: Tub/shower  unit;Curtain Shower/tub characteristics: Architectural technologist: Standard     Home Equipment: Environmental consultant - 2 wheels;Bedside commode;Grab bars - tub/shower;Cane - single point          Prior Functioning/Environment Level of Independence: Independent             OT Diagnosis: Acute pain   OT Problem List: Decreased strength;Decreased range of motion;Decreased activity tolerance;Decreased coordination;Decreased safety awareness;Pain;Impaired UE functional use   OT Treatment/Interventions:      OT Goals(Current goals can be found in the care plan section) Acute Rehab OT Goals Patient Stated Goal: to decrease pain OT Goal Formulation: With patient Time For Goal Achievement: 10/12/15 Potential to Achieve Goals: Good  OT Frequency:     Barriers to D/C:            Co-evaluation              End of Session Equipment Utilized During Treatment: Gait belt Nurse Communication: Mobility status  Activity Tolerance: Patient tolerated treatment well Patient left: in bed;with call bell/phone within reach   Time: 1152-1206 OT Time Calculation (min): 14 min Charges:  OT General Charges $OT Visit: 1 Procedure OT Evaluation $OT Eval Low Complexity: 1 Procedure G-Codes: OT G-codes **NOT FOR INPATIENT CLASS** Functional Assessment Tool Used: clinical judgement Functional Limitation: Self care Self Care Current Status [id number]): 0 percent impaired, limited or restricted Self Care Goal Status [id number]): 0 percent impaired, limited or restricted Self Care Discharge Status [id number]): 0 percent impaired, limited or restricted  Redmond Baseman, OTR/L Pager: [phone] 09/28/2015, 12:54 PM

## 2015-09-28 NOTE — Progress Notes (Signed)
PT Cancellation Note  Patient Details Name: Brian [name] MRN: [id number] DOB: 01-21-1951   Cancelled Treatment:    Reason Eval/Treat Not Completed: PT screened, no needs identified, will sign off.  Pt up in room ambulating stating he is back to normal self, except for R hand.  No PT needs identified.   Vernessa Likes LUBECK 09/28/2015, 11:19 AM

## 2015-09-30 LAB — HEMOGLOBIN A1C
Hgb A1c MFr Bld: 6 % — ABNORMAL HIGH (ref 4.8–5.6)
Mean Plasma Glucose: 126 mg/dL

## 2015-09-30 NOTE — Discharge Summary (Signed)
[facility] Discharge Summary   Patient: Brian [name] [id number]   PCP: No primary care provider on file. DOB: 1950/07/27   Date of admission: 09/27/2015   Date of discharge: 09/28/2015    Discharge Diagnoses:  Principal Problem:   Primary osteoarthritis of right hand Active Problems:   Atrial flutter ([place])   Noncompliance   HTN (hypertension)   Chronic diastolic heart failure (H[facility])   CVA (cerebral infarction)   Right hand pain   History of gout   Cocaine abuse   TIA (transient ischemic attack)   Slurred speech   Leukocytosis   Essential hypertension   Chronic diastolic CHF (congestive heart failure) ([place])  Recommendations for Outpatient Follow-up:  1.  Establish care with a PCP and follow-up with them in 1 week  Follow-up Information    Schedule an appointment as soon as possible for a visit in 1 week to follow up.   Why:  establish care with PCP and follow up in 1 week      Diet recommendation: Cardiac diet  Activity: The patient is advised to gradually reintroduce usual activities.  Discharge Condition: good  History of present illness: As per the H and P dictated on admission, "Brian [name] is a 65 y.o. male with pmh of aflutter noncompliant with Xarelto, chronic diastolic heart failure with admission 10/2013 for acute decompensation, tobacco abuse, gout, obesity and anxiety. Pt presented to [facility] this am with reports of slurred speech. Pt states he was unable to sleep last night due to right hand and wrist pain. He took Nyquil and his roommates Clonazepam (uncertain dose) prior to going to bed. Pt's roommate awoke him at 3am today for work. Roommate states that around 4:30am he noticed pt had slurred speech prompting visit to ED. Slurred speech persisted in ED, but is now resolved. Pt's only complaint is right hand and wrist pain. Given symptoms and pt's medical hx, there was concern for stroke. Pt has been admitted for further evaluation and  treatment. "  Hospital Course:  Summary of his active problems in the hospital is as following.  Principal Problem:   Primary osteoarthritis of right hand Suspected tenosynovitis. Patient presents with right hand pain causing numbness and weakness. MRI of the right wrist was showing possible tinea synovitis. Hand surgery was consulted and appreciated input from Dr. Grandville Silos. Joint was aspirated and the fluid was sent for analysis. Less likely infection. Patient will be discharged naproxen and prednisone taper.  Active Problems:   Atrial flutter (HCC)   Noncompliance   HTN (hypertension)   Chronic diastolic heart failure (HCC)   CVA (cerebral infarction) MRI brain negative for any acute stroke. Most likely due to pain. Continue home medication.    Cocaine abuse Patient requested to restrain from further abuse. Patient verbalized understanding.  All other chronic medical condition were stable during the hospitalization.  Patient was ambulatory without any assistance. Patient seen by physical therapy, who recommended no further therapy requirement. On the day of the discharge the patient's vitals were stable, and no other acute medical condition were reported by patient. the patient was felt safe to be discharge at home with family.  Procedures and Results:  Echocardiogram Study Conclusions  - Left ventricle: The cavity size was normal. Systolic function was  normal. The estimated ejection fraction was in the range of 55%  to 60%. Wall motion was normal; there were no regional wall  motion abnormalities. Left ventricular diastolic function  parameters were normal. - Aortic valve:  There was trivial regurgitation. Valve area (VTI):  1.79 cm^2. Valve area (Vmax): 2.01 cm^2. Valve area (Vmean): 2  cm^2. - Atrial septum: There was increased thickness of the septum,  consistent with lipomatous hypertrophy.   Consultations:  Neurology  DISCHARGE  MEDICATION: Discharge Medication List as of 09/28/2015  1:47 PM    START taking these medications   Details  naproxen (NAPROSYN) 500 MG tablet Take 1 tablet (500 mg total) by mouth 2 (two) times daily between meals as needed for moderate pain., Starting 09/28/2015, Until Discontinued, Normal    predniSONE (DELTASONE) 10 MG tablet Take 40mg  daily for 3days,Take 30mg  daily for 3days,Take 20mg  daily for 3days,Take 10mg  daily for 3days, then stop, Normal      CONTINUE these medications which have NOT CHANGED   Details  aspirin EC 81 MG tablet Take 81 mg by mouth daily., Until Discontinued, Historical Med    diltiazem (CARDIZEM CD) 180 MG 24 hr capsule Take 1 capsule (180 mg total) by mouth daily., Starting 04/24/2015, Until Discontinued, Normal    furosemide (LASIX) 20 MG tablet Take 10 mg by mouth daily., Until Discontinued, Historical Med    rivaroxaban (XARELTO) 20 MG TABS tablet Take 1 tablet (20 mg total) by mouth daily with supper., Starting 04/24/2015, Until Discontinued, Normal       No Known Allergies Discharge Instructions    Diet - low sodium heart healthy    Complete by:  As directed      Discharge instructions    Complete by:  As directed   It is important that you read following instructions as well as go over your medication list with RN to help you understand your care after this hospitalization.  Discharge Instructions: Please follow-up with PCP in one week  Please request your primary care physician to go over all Hospital Tests and Procedure/Radiological results at the follow up,  Please get all Hospital records sent to your PCP by signing hospital release before you go home.   You were cared for by a hospitalist during your hospital stay. If you have any questions about your discharge medications or the care you received while you were in the hospital after you are discharged, you can call the unit and ask to speak with the hospitalist on call if the hospitalist that  took care of you is not available.  Once you are discharged, your primary care physician will handle any further medical issues. Please note that NO REFILLS for any discharge medications will be authorized once you are discharged, as it is imperative that you return to your primary care physician (or establish a relationship with a primary care physician if you do not have one) for your aftercare needs so that they can reassess your need for medications and monitor your lab values. You Must read complete instructions/literature along with all the possible adverse reactions/side effects for all the Medicines you take and that have been prescribed to you. Take any new Medicines after you have completely understood and accept all the possible adverse reactions/side effects. Wear Seat belts while driving. If you have smoked or chewed Tobacco in the last 2 yrs please stop smoking and/or stop any Recreational drug use.     Increase activity slowly    Complete by:  As directed           Discharge Exam: Filed Weights   09/27/15 0739  Weight: 95.255 kg (210 lb)   Filed Vitals:   09/28/15 0554 09/28/15 0908  BP:  142/67 135/62  Pulse: 70 77  Temp: 98 F (36.7 C) 98.2 F (36.8 C)  Resp: 16 16   General: Appear in no distress, no Rash; Oral Mucosa moist. Cardiovascular: S1 and S2 Present, no Murmur, no JVD Respiratory: Bilateral Air entry present and Clear to Auscultation, no Crackles, no wheezes Abdomen: Bowel Sound present, Soft and no tenderness Extremities: no Pedal edema, no calf tenderness, right wrist swelling Neurology: Grossly no focal neuro deficit.  The results of significant diagnostics from this hospitalization (including imaging, microbiology, ancillary and laboratory) are listed below for reference.    Significant Diagnostic Studies: Ct Angio Head W/cm &/or Wo Cm  09/27/2015  CLINICAL DATA:  Altered mental status, slurred speech, and right hand numbness. EXAM: CT ANGIOGRAPHY  HEAD AND NECK TECHNIQUE: Multidetector CT imaging of the head and neck was performed using the standard protocol during bolus administration of intravenous contrast. Multiplanar CT image reconstructions and MIPs were obtained to evaluate the vascular anatomy. Carotid stenosis measurements (when applicable) are obtained utilizing NASCET criteria, using the distal internal carotid diameter as the denominator. CONTRAST:  75 cc Isovue 370 intravenous. COMPARISON:  Head CT from earlier the same day FINDINGS: CTA NECK Aortic arch: Atheromatous calcification and thickening of the wall without aneurysm or dissection. Three vessel branching Right carotid system: Degraded by motion but best obtainable given condition of patient. Noncalcified plaque on the distal common carotid wall. Mild mainly calcified plaque at the carotid bifurcation. No stenosis, dissection, or beading. Left carotid system: Motion degradation as noted above. Atheromatous wall thickening, greatest at the proximal ICA where there is a calcified plaque narrowing the lumen by ~30%. Below this mild stenosis is an area of curvilinear low-density in the lumen that is likely motion and flow artifact, not discrete enough to represent carotid web. No flow limiting stenosis, dissection, ulcerated plaque, or vessel beading. Vertebral arteries:No proximal subclavian artery stenosis. Motion degradation intermittently at the level of the vertebral arteries. Negative for stenosis (but particularly limited at the left vertebral artery ostium due to soft tissue attenuation and streak artifact) or dissection Skeleton: Spondylosis and facet arthropathy. Other neck: No incidental mass or adenopathy in the neck. CTA HEAD Motion degraded above the level of the circle-of-Willis. Anterior circulation: Intact circle-of-Willis with small communicating arteries. The posterior communicating arteries have small infundibula. Symmetric carotid arteries with atheromatous calcification  on the carotid siphons but no stenosis. The A1 and M1 segments are widely patent. No major branch occlusion is seen. No vessel beading or flow limiting stenosis. Negative for aneurysm. Posterior circulation: Symmetric vertebral arteries and vertebrobasilar branching. Smooth and widely patent vertebral, basilar, and posterior cerebral arteries. No evidence of aneurysm or vascular malformation. Venous sinuses: Patent Anatomic variants: None Delayed phase: No parenchymal enhancement or mass lesion identified. IMPRESSION: 1. No acute arterial finding or flow limiting stenosis. 2. Cervical and intracranial atherosclerosis as described. 3. Intermittently motion degraded. Electronically Signed   By: Monte Fantasia M.D.   On: 09/27/2015 09:12   Dg Chest 2 View  09/27/2015  CLINICAL DATA:  Slurred speech and altered mental status EXAM: CHEST  2 VIEW COMPARISON:  Chest CT August 07, 2014; chest radiograph Nov 17, 2014 FINDINGS: There is slight scarring in the left base. The lungs elsewhere are clear. Heart size and pulmonary vascularity are normal. No adenopathy. No bone lesions. IMPRESSION: Mild scarring left base. Lungs elsewhere clear. Cardiac silhouette within normal limits. Electronically Signed   By: Lowella Grip III M.D.   On: 09/27/2015 10:47  Dg Wrist Complete Right  09/27/2015  CLINICAL DATA:  Right wrist pain and swelling EXAM: RIGHT WRIST - COMPLETE 3+ VIEW COMPARISON:  04/23/2015 FINDINGS: Four views of the right wrist submitted. No acute fracture or subluxation. Mild degenerative narrowing of radiocarpal joint. Minimal degenerative changes first carpometacarpal joint. IMPRESSION: No acute fracture or subluxation.  Mild degenerative changes. Electronically Signed   By: Lahoma Crocker M.D.   On: 09/27/2015 10:45   Ct Head Wo Contrast  09/27/2015  CLINICAL DATA:  Slurred speech, altered mental status EXAM: CT HEAD WITHOUT CONTRAST TECHNIQUE: Contiguous axial images were obtained from the base of the  skull through the vertex without intravenous contrast. COMPARISON:  04/17/2014 FINDINGS: Brain: No intracranial hemorrhage, mass effect or midline shift. Mild cerebral atrophy. Ventricular size is stable from prior exam. There are motion artifacts. No definite acute cortical infarction. No mass lesion is noted on this unenhanced scan. No intra or extra-axial fluid collection. Vascular: Mild atherosclerotic calcifications of carotid siphon. Skull: No skull fracture.  No destructive bony lesions. Sinuses/Orbits: No acute findings. Other: None. IMPRESSION: No acute intracranial abnormality. No definite acute cortical infarction. Mild cerebral atrophy. There are motion artifacts. Electronically Signed   By: Lahoma Crocker M.D.   On: 09/27/2015 08:19   Ct Angio Neck W/cm &/or Wo/cm  09/27/2015  CLINICAL DATA:  Altered mental status, slurred speech, and right hand numbness. EXAM: CT ANGIOGRAPHY HEAD AND NECK TECHNIQUE: Multidetector CT imaging of the head and neck was performed using the standard protocol during bolus administration of intravenous contrast. Multiplanar CT image reconstructions and MIPs were obtained to evaluate the vascular anatomy. Carotid stenosis measurements (when applicable) are obtained utilizing NASCET criteria, using the distal internal carotid diameter as the denominator. CONTRAST:  75 cc Isovue 370 intravenous. COMPARISON:  Head CT from earlier the same day FINDINGS: CTA NECK Aortic arch: Atheromatous calcification and thickening of the wall without aneurysm or dissection. Three vessel branching Right carotid system: Degraded by motion but best obtainable given condition of patient. Noncalcified plaque on the distal common carotid wall. Mild mainly calcified plaque at the carotid bifurcation. No stenosis, dissection, or beading. Left carotid system: Motion degradation as noted above. Atheromatous wall thickening, greatest at the proximal ICA where there is a calcified plaque narrowing the lumen  by ~30%. Below this mild stenosis is an area of curvilinear low-density in the lumen that is likely motion and flow artifact, not discrete enough to represent carotid web. No flow limiting stenosis, dissection, ulcerated plaque, or vessel beading. Vertebral arteries:No proximal subclavian artery stenosis. Motion degradation intermittently at the level of the vertebral arteries. Negative for stenosis (but particularly limited at the left vertebral artery ostium due to soft tissue attenuation and streak artifact) or dissection Skeleton: Spondylosis and facet arthropathy. Other neck: No incidental mass or adenopathy in the neck. CTA HEAD Motion degraded above the level of the circle-of-Willis. Anterior circulation: Intact circle-of-Willis with small communicating arteries. The posterior communicating arteries have small infundibula. Symmetric carotid arteries with atheromatous calcification on the carotid siphons but no stenosis. The A1 and M1 segments are widely patent. No major branch occlusion is seen. No vessel beading or flow limiting stenosis. Negative for aneurysm. Posterior circulation: Symmetric vertebral arteries and vertebrobasilar branching. Smooth and widely patent vertebral, basilar, and posterior cerebral arteries. No evidence of aneurysm or vascular malformation. Venous sinuses: Patent Anatomic variants: None Delayed phase: No parenchymal enhancement or mass lesion identified. IMPRESSION: 1. No acute arterial finding or flow limiting stenosis. 2. Cervical and intracranial  atherosclerosis as described. 3. Intermittently motion degraded. Electronically Signed   By: Monte Fantasia M.D.   On: 09/27/2015 09:12   Mr Brain Wo Contrast  09/27/2015  CLINICAL DATA:  Slurred speech beginning at 4:30 a.m., now resolved. Patient took medication for RIGHT extremity pain. History of atrial fibrillation, tobacco abuse, CHF. EXAM: MRI HEAD WITHOUT CONTRAST TECHNIQUE: Multiplanar, multiecho pulse sequences of the  brain and surrounding structures were obtained without intravenous contrast. Coronal T2 imaging not performed as patient was unable to tolerate further imaging. COMPARISON:  CT head September 27, 2015 at 0811 hours FINDINGS: Multiple sequences are mildly motion degraded. The ventricles and sulci are normal for age. No abnormal parenchymal signal, mass lesions, mass effect. No reduced diffusion to suggest acute ischemia. A few scattered subcentimeter white matter FLAIR T2 hyperintensities most compatible with chronic small ischemic, within normal range for patient's age. No susceptibility artifact to suggest hemorrhage. No abnormal extra-axial fluid collections. No extra-axial masses though, contrast enhanced sequences would be more sensitive. Normal major intracranial vascular flow voids seen at the skull base. Ocular globes and orbital contents are unremarkable though not tailored for evaluation. No abnormal sellar expansion. No suspicious calvarial bone marrow signal. Craniocervical junction maintained. Visualized paranasal sinuses and mastoid air cells are well-aerated. Patient is edentulous. IMPRESSION: Negative mildly motion degraded noncontrast MRI brain for age. Electronically Signed   By: Elon Alas M.D.   On: 09/27/2015 23:12   Mr Wrist Right Wo Contrast  09/28/2015  CLINICAL DATA:  Right hand and wrist pain. No known injury. Slurred speech. Possible stroke. EXAM: MR OF THE RIGHT WRIST WITHOUT CONTRAST TECHNIQUE: Multiplanar, multisequence MR imaging of the right wrist was performed. No intravenous contrast was administered. COMPARISON:  Radiographs 09/27/2015 FINDINGS: Examination is very limited due to patient motion. No acute or gross bony abnormality is identified. No obvious marrow edema on the T2 weighted images. The major dorsal and volar wrist tendons appear intact. Mild tenosynovitis involving the dorsal tendons and small amount of fluid in the carpal tunnel. There is a moderate-sized joint  effusion and also a moderate effusion at the radioulnar joint. No obvious erosions. IMPRESSION: 1. Very limited examination due to patient motion. 2. No obvious acute bony abnormality. The intercarpal joint spaces appear relatively well maintained. 3. Moderate-sized wrist joint effusion and suspect tenosynovitis. Possible inflammatory arthropathy. No obvious erosions. Joint aspiration may be helpful. Electronically Signed   By: Marijo Sanes M.D.   On: 09/28/2015 08:23    Microbiology: Recent Results (from the past 240 hour(s))  Body fluid culture     Status: None (Preliminary result)   Collection Time: 09/28/15  1:30 PM  Result Value Ref Range Status   Specimen Description SYNOVIAL RIGHT WRIST  Final   Special Requests Normal  Final   Gram Stain NO GRAM STAIN PERFORMED DUE TO LOW SPECIMEN VOLUME  Final   Culture NO GROWTH < 24 HOURS  Final   Report Status PENDING  Incomplete     Labs: CBC:  Recent Labs Lab 09/27/15 0745 09/28/15 0402  WBC 14.8* 9.8  NEUTROABS 11.9*  --   HGB 14.2 13.3  HCT 43.1 42.2  MCV 92.7 93.4  PLT 235 [id number]   Basic Metabolic Panel:  Recent Labs Lab 09/27/15 0745 09/28/15 0402  NA 138 140  K 4.8 4.4  CL 107 107  CO2 25 28  GLUCOSE 127* 117*  BUN 15 11  CREATININE 0.71 0.71  CALCIUM 8.6* 8.2*   Liver Function Tests:  Recent  Labs Lab 09/27/15 0745  AST 23  ALT 24  ALKPHOS 55  BILITOT 0.6  PROT 7.5  ALBUMIN 3.8   No results for input(s): LIPASE, AMYLASE in the last 168 hours. No results for input(s): AMMONIA in the last 168 hours. Cardiac Enzymes:  Recent Labs Lab 09/27/15 0745  TROPONINI <0.03   BNP (last 3 results) No results for input(s): BNP in the last 8760 hours. CBG:  Recent Labs Lab 09/27/15 0800  GLUCAP 150*   Time spent: 30 minutes  Signed:  PATEL, PRANAV  [facility] 09/28/2015 , 10:20 AM

## 2015-10-01 LAB — BODY FLUID CULTURE
Culture: NO GROWTH
SPECIAL REQUESTS: NORMAL

## 2016-01-17 ENCOUNTER — Other Ambulatory Visit: Payer: Self-pay | Admitting: *Deleted

## 2016-01-17 MED ORDER — RIVAROXABAN 20 MG PO TABS: 20.0000 mg | ORAL_TABLET | Freq: Every day | ORAL | Status: DC

## 2016-09-27 DIAGNOSIS — J189 Pneumonia, unspecified organism: Secondary | ICD-10-CM

## 2016-09-27 HISTORY — DX: Pneumonia, unspecified organism: J18.9

## 2016-09-28 ENCOUNTER — Telehealth: Payer: Self-pay | Admitting: Family Medicine

## 2016-09-28 DIAGNOSIS — R52 Pain, unspecified: Secondary | ICD-10-CM | POA: Diagnosis not present

## 2016-09-28 DIAGNOSIS — J22 Unspecified acute lower respiratory infection: Secondary | ICD-10-CM | POA: Diagnosis not present

## 2016-09-28 DIAGNOSIS — R05 Cough: Secondary | ICD-10-CM | POA: Diagnosis not present

## 2016-09-28 DIAGNOSIS — R0602 Shortness of breath: Secondary | ICD-10-CM | POA: Diagnosis not present

## 2016-09-28 NOTE — Telephone Encounter (Signed)
I cannot see that I have seen him since I have been at [facility].  I am unable to take on any new pts at this time.  Would offer for him to see one of our other providers who are taking new patients.

## 2016-09-28 NOTE — Telephone Encounter (Signed)
Pt has not seen dr burchette in over 5 yrs and would like to re-est. Dr Elease Hashimoto was his parents md. Can I sch?

## 2016-09-29 NOTE — Telephone Encounter (Signed)
lmom for pt to call back

## 2016-09-30 ENCOUNTER — Emergency Department (HOSPITAL_COMMUNITY): Payer: PPO

## 2016-09-30 ENCOUNTER — Inpatient Hospital Stay (HOSPITAL_COMMUNITY)
Admission: EM | Admit: 2016-09-30 | Discharge: 2016-10-02 | DRG: 194 | Disposition: A | Discharge status: Home / Self Care | Payer: PPO | Attending: Family Medicine | Admitting: Family Medicine

## 2016-09-30 ENCOUNTER — Encounter (HOSPITAL_COMMUNITY): Payer: Self-pay

## 2016-09-30 ENCOUNTER — Inpatient Hospital Stay (HOSPITAL_COMMUNITY): Payer: PPO

## 2016-09-30 DIAGNOSIS — Z8249 Family history of ischemic heart disease and other diseases of the circulatory system: Secondary | ICD-10-CM | POA: Diagnosis not present

## 2016-09-30 DIAGNOSIS — J918 Pleural effusion in other conditions classified elsewhere: Secondary | ICD-10-CM | POA: Diagnosis not present

## 2016-09-30 DIAGNOSIS — M19041 Primary osteoarthritis, right hand: Secondary | ICD-10-CM | POA: Diagnosis present

## 2016-09-30 DIAGNOSIS — I509 Heart failure, unspecified: Secondary | ICD-10-CM | POA: Diagnosis not present

## 2016-09-30 DIAGNOSIS — R825 Elevated urine levels of drugs, medicaments and biological substances: Secondary | ICD-10-CM

## 2016-09-30 DIAGNOSIS — J189 Pneumonia, unspecified organism: Secondary | ICD-10-CM

## 2016-09-30 DIAGNOSIS — R000 Tachycardia, unspecified: Secondary | ICD-10-CM | POA: Diagnosis present

## 2016-09-30 DIAGNOSIS — I110 Hypertensive heart disease with heart failure: Secondary | ICD-10-CM | POA: Diagnosis present

## 2016-09-30 DIAGNOSIS — F17210 Nicotine dependence, cigarettes, uncomplicated: Secondary | ICD-10-CM | POA: Diagnosis not present

## 2016-09-30 DIAGNOSIS — E669 Obesity, unspecified: Secondary | ICD-10-CM | POA: Diagnosis not present

## 2016-09-30 DIAGNOSIS — Z6834 Body mass index (BMI) 34.0-34.9, adult: Secondary | ICD-10-CM

## 2016-09-30 DIAGNOSIS — J9811 Atelectasis: Secondary | ICD-10-CM | POA: Diagnosis present

## 2016-09-30 DIAGNOSIS — J90 Pleural effusion, not elsewhere classified: Secondary | ICD-10-CM

## 2016-09-30 DIAGNOSIS — Z7901 Long term (current) use of anticoagulants: Secondary | ICD-10-CM

## 2016-09-30 DIAGNOSIS — R05 Cough: Secondary | ICD-10-CM | POA: Diagnosis not present

## 2016-09-30 DIAGNOSIS — R0682 Tachypnea, not elsewhere classified: Secondary | ICD-10-CM | POA: Insufficient documentation

## 2016-09-30 DIAGNOSIS — I959 Hypotension, unspecified: Secondary | ICD-10-CM | POA: Diagnosis present

## 2016-09-30 DIAGNOSIS — I10 Essential (primary) hypertension: Secondary | ICD-10-CM | POA: Diagnosis not present

## 2016-09-30 DIAGNOSIS — Z9109 Other allergy status, other than to drugs and biological substances: Secondary | ICD-10-CM

## 2016-09-30 DIAGNOSIS — Z833 Family history of diabetes mellitus: Secondary | ICD-10-CM | POA: Diagnosis not present

## 2016-09-30 DIAGNOSIS — F419 Anxiety disorder, unspecified: Secondary | ICD-10-CM | POA: Diagnosis not present

## 2016-09-30 DIAGNOSIS — R846 Abnormal cytological findings in specimens from respiratory organs and thorax: Secondary | ICD-10-CM | POA: Diagnosis not present

## 2016-09-30 DIAGNOSIS — I4892 Unspecified atrial flutter: Secondary | ICD-10-CM | POA: Diagnosis not present

## 2016-09-30 DIAGNOSIS — J181 Lobar pneumonia, unspecified organism: Secondary | ICD-10-CM | POA: Diagnosis not present

## 2016-09-30 DIAGNOSIS — F1411 Cocaine abuse, in remission: Secondary | ICD-10-CM | POA: Diagnosis present

## 2016-09-30 DIAGNOSIS — I5032 Chronic diastolic (congestive) heart failure: Secondary | ICD-10-CM | POA: Diagnosis present

## 2016-09-30 DIAGNOSIS — I313 Pericardial effusion (noninflammatory): Secondary | ICD-10-CM | POA: Diagnosis not present

## 2016-09-30 DIAGNOSIS — R079 Chest pain, unspecified: Secondary | ICD-10-CM

## 2016-09-30 DIAGNOSIS — R0600 Dyspnea, unspecified: Secondary | ICD-10-CM | POA: Diagnosis not present

## 2016-09-30 DIAGNOSIS — Z9889 Other specified postprocedural states: Secondary | ICD-10-CM

## 2016-09-30 DIAGNOSIS — Z8673 Personal history of transient ischemic attack (TIA), and cerebral infarction without residual deficits: Secondary | ICD-10-CM | POA: Diagnosis not present

## 2016-09-30 DIAGNOSIS — F1190 Opioid use, unspecified, uncomplicated: Secondary | ICD-10-CM | POA: Diagnosis not present

## 2016-09-30 DIAGNOSIS — I4589 Other specified conduction disorders: Secondary | ICD-10-CM | POA: Diagnosis present

## 2016-09-30 DIAGNOSIS — R0602 Shortness of breath: Secondary | ICD-10-CM | POA: Diagnosis not present

## 2016-09-30 HISTORY — DX: Pneumonia, unspecified organism: J18.9

## 2016-09-30 HISTORY — DX: Essential (primary) hypertension: I10

## 2016-09-30 HISTORY — DX: Heart failure, unspecified: I50.9

## 2016-09-30 LAB — RAPID URINE DRUG SCREEN, HOSP PERFORMED
Amphetamines: NOT DETECTED
BARBITURATES: NOT DETECTED
Benzodiazepines: NOT DETECTED
COCAINE: NOT DETECTED
Opiates: POSITIVE — AB
TETRAHYDROCANNABINOL: NOT DETECTED

## 2016-09-30 LAB — CBC
HEMATOCRIT: 40.1 % (ref 39.0–52.0)
Hemoglobin: 12.9 g/dL — ABNORMAL LOW (ref 13.0–17.0)
MCH: 29.3 pg (ref 26.0–34.0)
MCHC: 32.2 g/dL (ref 30.0–36.0)
MCV: 91.1 fL (ref 78.0–100.0)
Platelets: 423 10*3/uL — ABNORMAL HIGH (ref 150–400)
RBC: 4.4 MIL/uL (ref 4.22–5.81)
RDW: 13.2 % (ref 11.5–15.5)
WBC: 22.5 10*3/uL — AB (ref 4.0–10.5)

## 2016-09-30 LAB — BASIC METABOLIC PANEL
ANION GAP: 8 (ref 5–15)
BUN: 20 mg/dL (ref 6–20)
CALCIUM: 8.4 mg/dL — AB (ref 8.9–10.3)
CO2: 26 mmol/L (ref 22–32)
Chloride: 104 mmol/L (ref 101–111)
Creatinine, Ser: 0.71 mg/dL (ref 0.61–1.24)
GFR calc Af Amer: >60 mL/min (ref >60)
Glucose, Bld: 116 mg/dL — ABNORMAL HIGH (ref 65–99)
Potassium: 4.3 mmol/L (ref 3.5–5.1)
SODIUM: 138 mmol/L (ref 135–145)

## 2016-09-30 LAB — BRAIN NATRIURETIC PEPTIDE: B Natriuretic Peptide: 153.5 pg/mL — ABNORMAL HIGH (ref 0.0–100.0)

## 2016-09-30 LAB — I-STAT CG4 LACTIC ACID, ED: LACTIC ACID, VENOUS: 2.21 mmol/L — AB (ref 0.5–1.9)

## 2016-09-30 LAB — PROTIME-INR
INR: 1.13
PROTHROMBIN TIME: 14.6 s (ref 11.4–15.2)

## 2016-09-30 LAB — TROPONIN I

## 2016-09-30 LAB — I-STAT TROPONIN, ED: TROPONIN I, POC: 0 ng/mL (ref 0.00–0.08)

## 2016-09-30 MED ORDER — ACETAMINOPHEN 325 MG PO TABS
650.0000 mg | ORAL_TABLET | Freq: Once | ORAL | Status: AC
  Administered 2016-09-30: 650 mg via ORAL
  Filled 2016-09-30: qty 2

## 2016-09-30 MED ORDER — IPRATROPIUM-ALBUTEROL 0.5-2.5 (3) MG/3ML IN SOLN
3.0000 mL | RESPIRATORY_TRACT | Status: DC
  Administered 2016-09-30 (×2): 3 mL via RESPIRATORY_TRACT
  Filled 2016-09-30 (×2): qty 3

## 2016-09-30 MED ORDER — ACETAMINOPHEN 650 MG RE SUPP: 650.0000 mg | Freq: Four times a day (QID) | RECTAL | Status: DC | PRN

## 2016-09-30 MED ORDER — SODIUM CHLORIDE 0.9% FLUSH
3.0000 mL | Freq: Two times a day (BID) | INTRAVENOUS | Status: DC
  Administered 2016-09-30 – 2016-10-01 (×2): 3 mL via INTRAVENOUS

## 2016-09-30 MED ORDER — LEVOFLOXACIN IN D5W 750 MG/150ML IV SOLN
750.0000 mg | INTRAVENOUS | Status: DC
  Administered 2016-10-01: 750 mg via INTRAVENOUS
  Filled 2016-09-30: qty 150

## 2016-09-30 MED ORDER — ONDANSETRON HCL 4 MG/2ML IJ SOLN: 4.0000 mg | Freq: Four times a day (QID) | INTRAMUSCULAR | Status: DC | PRN

## 2016-09-30 MED ORDER — ACETAMINOPHEN 325 MG PO TABS
650.0000 mg | ORAL_TABLET | Freq: Four times a day (QID) | ORAL | Status: DC | PRN
Start: 2016-09-30 — End: 2016-10-02
  Administered 2016-10-01: 650 mg via ORAL
  Filled 2016-09-30: qty 2

## 2016-09-30 MED ORDER — FAMOTIDINE 20 MG PO TABS
20.0000 mg | ORAL_TABLET | Freq: Every day | ORAL | Status: DC
  Administered 2016-09-30 – 2016-10-02 (×3): 20 mg via ORAL
  Filled 2016-09-30 (×3): qty 1

## 2016-09-30 MED ORDER — DEXTROSE 5 % IV SOLN
2.0000 g | INTRAVENOUS | Status: DC
  Administered 2016-09-30: 2 g via INTRAVENOUS
  Filled 2016-09-30: qty 2

## 2016-09-30 MED ORDER — POLYETHYLENE GLYCOL 3350 17 G PO PACK: 17.0000 g | PACK | Freq: Every day | ORAL | Status: DC | PRN

## 2016-09-30 MED ORDER — ONDANSETRON HCL 4 MG PO TABS: 4.0000 mg | ORAL_TABLET | Freq: Four times a day (QID) | ORAL | Status: DC | PRN

## 2016-09-30 MED ORDER — DEXTROSE 5 % IV SOLN
500.0000 mg | Freq: Once | INTRAVENOUS | Status: AC
  Administered 2016-09-30: 500 mg via INTRAVENOUS
  Filled 2016-09-30: qty 500

## 2016-09-30 MED ORDER — SODIUM CHLORIDE 0.9 % IV BOLUS (SEPSIS)
500.0000 mL | Freq: Once | INTRAVENOUS | Status: AC
  Administered 2016-09-30: 500 mL via INTRAVENOUS

## 2016-09-30 MED ORDER — IOPAMIDOL (ISOVUE-370) INJECTION 76%
INTRAVENOUS | Status: AC
  Administered 2016-09-30: 100 mL
  Filled 2016-09-30: qty 100

## 2016-09-30 MED ORDER — FUROSEMIDE 40 MG PO TABS: 40.0000 mg | ORAL_TABLET | Freq: Every day | ORAL | Status: DC | PRN

## 2016-09-30 MED ORDER — SODIUM CHLORIDE 0.9% FLUSH: 3.0000 mL | INTRAVENOUS | Status: DC | PRN

## 2016-09-30 MED ORDER — SODIUM CHLORIDE 0.9 % IV SOLN: 250.0000 mL | INTRAVENOUS | Status: DC | PRN

## 2016-09-30 NOTE — ED Triage Notes (Signed)
Patient complains of ongoing shortness of breath and some swelling to legs for 2 days. Was diagnosed with pneumonia on Monday at [facility] and taking antibiotics and lasix as prescribed. Low grade fever with same. Speaking complete sentences on arrival

## 2016-09-30 NOTE — ED Notes (Signed)
Pt reports that he stopped taking anticoagulant about 1 week ago because he wasn't sure if they would need to do a procedure for his pneumonia. I explained to pt the risk of stopping a medication without being ordered to by physician and he verbalized understanding. PT reports that he only took medication occasionally prior to stopping all together.
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
## 2016-09-30 NOTE — ED Provider Notes (Signed)
[facility] DEPT Provider Note   CSN: [id number] Arrival date & time: 09/30/16  1205     History   Chief Complaint Chief Complaint  Patient presents with  . Shortness of Breath  . recheck pneumonia    HPI Brian [name] is a 66 y.o. male.  HPI  66 year old male with a history of atrial flutter currently on anticoagulation as well as diastolic CHF presents with pneumonia. Patient states he's not been feeling well for a couple weeks with cough and congestion. However this is all got significant worse over the last 4 days. 2 days ago went to [facility] and was diagnosed with pneumonia on a chest x-ray. Was given IM Rocephin, IM Decadron, and discharged on azithromycin. Has had 2 doses of azithromycin. Since then short of breath is still present and he feels he is getting a little worse. Low-grade fevers of 99. Trace leg swelling but he states he tip of the takes Lasix and has not noticed significant leg swelling. Shortness of breath is most present with any type of minimal exertion. No chest pain.  Past Medical History:  Diagnosis Date  . Anxiety   . Atrial flutter ([place])    a. 08/2012  . Diastolic CHF ([place])    a. 06/6071, in setting of aflutter;  b. 08/2012 Echo: EF 55-60%.  . Hypertension   . Obesity   . Shortness of breath   . Tobacco abuse     Patient Active Problem List   Diagnosis Date Noted  . CVA (cerebral infarction) 09/27/2015  . Right hand pain 09/27/2015  . History of gout 09/27/2015  . Cocaine abuse 09/27/2015  . TIA (transient ischemic attack) 09/27/2015  . Slurred speech   . Primary osteoarthritis of right hand   . Leukocytosis   . Essential hypertension   . Chronic diastolic CHF (congestive heart failure) ([place])   . HTN (hypertension) 11/22/2013  . Chronic diastolic heart failure ([place]) 11/22/2013  . Noncompliance 11/17/2013  . Atrial flutter with rapid ventricular response ([place]) 11/16/2013  . Acute on chronic diastolic CHF (congestive heart  failure) ([place]) 09/05/2012  . Tobacco abuse   . Obesity- BMI 35   . Diastolic CHF ([place])   . Atrial flutter ([place])   . Anxiety     Past Surgical History:  Procedure Laterality Date  . MICROLARYNGOSCOPY  09/02/2007   with excision of right vocal cord mass, Dr. Constance Holster       Home Medications    Prior to Admission medications   Medication Sig Start Date End Date Taking? Authorizing Provider  furosemide (LASIX) 40 MG tablet Take 40 mg by mouth daily as needed for fluid or edema.   Yes Historical Provider, MD  naproxen (NAPROSYN) 500 MG tablet Take 1 tablet (500 mg total) by mouth 2 (two) times daily between meals as needed for moderate pain. 09/28/15  Yes Lavina Hamman, MD  diltiazem (CARDIZEM CD) 180 MG 24 hr capsule Take 1 capsule (180 mg total) by mouth daily. Patient not taking: Reported on 09/30/2016 04/24/15   Isaiah Serge, NP  predniSONE (DELTASONE) 10 MG tablet Take 40mg  daily for 3days,Take 30mg  daily for 3days,Take 20mg  daily for 3days,Take 10mg  daily for 3days, then stop Patient not taking: Reported on 09/30/2016 09/28/15   Lavina Hamman, MD  rivaroxaban (XARELTO) 20 MG TABS tablet Take 1 tablet (20 mg total) by mouth daily with supper. 01/17/16   Isaiah Serge, NP    Family History Family History  Problem  Relation Age of Onset  . Cancer Mother   . Heart disease Father   . Heart attack Father   . Cancer Father     Social History Social History  Substance Use Topics  . Smoking status: Current Some Day Smoker    Packs/day: 1.00    Years: 50.00    Types: Cigarettes  . Smokeless tobacco: Never Used     Comment: quit 11/04/12  . Alcohol use Yes     Comment: socially     Allergies   Tape   Review of Systems Review of Systems  Constitutional: Positive for chills, fatigue and fever.  HENT: Positive for congestion.   Respiratory: Positive for cough and shortness of breath.   Cardiovascular: Negative for chest pain and leg swelling.  Gastrointestinal: Negative for  abdominal pain and vomiting.  Neurological: Positive for weakness.  All other systems reviewed and are negative.    Physical Exam Updated Vital Signs BP (!) 136/95   Pulse (!) 110   Temp 99.7 F (37.6 C) (Oral)   Resp (!) 22   Ht 5\' 10"  (1.778 m)   Wt 238 lb (108 kg)   SpO2 98%   BMI 34.15 kg/m   Physical Exam  Constitutional: He is oriented to person, place, and time. He appears well-developed and well-nourished. No distress.  HENT:  Head: Normocephalic and atraumatic.  Right Ear: External ear normal.  Left Ear: External ear normal.  Nose: Nose normal.  Eyes: Right eye exhibits no discharge. Left eye exhibits no discharge.  Neck: Neck supple.  Cardiovascular: Normal rate, regular rhythm and normal heart sounds.   Pulmonary/Chest: Effort normal. He has decreased breath sounds in the right lower field and the left lower field.  No tachypnea but minimal exertion (I.e. Sitting up in stretcher) causes dyspnea  Abdominal: Soft. There is no tenderness.  Musculoskeletal: He exhibits no edema (trace, pedal).  Neurological: He is alert and oriented to person, place, and time.  Skin: Skin is warm and dry. He is not diaphoretic.  Nursing note and vitals reviewed.    ED Treatments / Results  Labs (all labs ordered are listed, but only abnormal results are displayed) Labs Reviewed  BASIC METABOLIC PANEL - Abnormal; Notable for the following:       Result Value   Glucose, Bld 116 (*)    Calcium 8.4 (*)    All other components within normal limits  CBC - Abnormal; Notable for the following:    WBC 22.5 (*)    Hemoglobin 12.9 (*)    Platelets 423 (*)    All other components within normal limits  BRAIN NATRIURETIC PEPTIDE - Abnormal; Notable for the following:    B Natriuretic Peptide 153.5 (*)    All other components within normal limits  I-STAT CG4 LACTIC ACID, ED - Abnormal; Notable for the following:    Lactic Acid, Venous 2.21 (*)    All other components within normal  limits  CULTURE, BLOOD (ROUTINE X 2)  CULTURE, BLOOD (ROUTINE X 2)  PROTIME-INR  RAPID URINE DRUG SCREEN, HOSP PERFORMED  I-STAT TROPOININ, ED    EKG  EKG Interpretation  Date/Time:  Wednesday September 30 2016 12:16:59 EDT Ventricular Rate:  118 PR Interval:  144 QRS Duration: 72 QT Interval:  312 QTC Calculation: 437 R Axis:   84 Text Interpretation:  Sinus tachycardia with Premature atrial complexes Possible Left atrial enlargement Cannot rule out Anterior infarct , age undetermined Abnormal ECG rate is faster when comapred to  Mar 2017 Confirmed by Regenia Skeeter MD, [place] [phone]) on 09/30/2016 1:05:33 PM       Radiology Dg Chest 2 View  Result Date: 09/30/2016 CLINICAL DATA:  Cough and congestion. EXAM: CHEST  2 VIEW COMPARISON:  09/27/2015 FINDINGS: The cardiac silhouette, mediastinal and hilar contours are stable. Left lower lobe process most likely a combination of effusion, atelectasis and/or infiltrate. Right basilar atelectasis is also noted. No findings for pulmonary edema. IMPRESSION: Left lower lobe process likely a combination of infiltrate or atelectasis and left pleural effusion. Electronically Signed   By: Marijo Sanes M.D.   On: 09/30/2016 12:45    Procedures Procedures (including critical care time)  Medications Ordered in ED Medications  azithromycin (ZITHROMAX) 500 mg in dextrose 5 % 250 mL IVPB (not administered)  cefTRIAXone (ROCEPHIN) 2 g in dextrose 5 % 50 mL IVPB (2 g Intravenous New Bag/Given 09/30/16 1442)  acetaminophen (TYLENOL) tablet 650 mg (not administered)  sodium chloride 0.9 % bolus 500 mL (500 mLs Intravenous New Bag/Given 09/30/16 1442)     Initial Impression / Assessment and Plan / ED Course  I have reviewed the triage vital signs and the nursing notes.  Pertinent labs & imaging results that were available during my care of the patient were reviewed by me and considered in my medical decision making (see chart for details).     I believe this  pleural effusion/infiltrate is infectious. Has hx of heart failure but does not appear significantly volume overloaded. Given lactate, WBC, cough, CXR, I think it's PNA with effusion. IV antibiotics, admit. No respiratory distress. Minimal movement causes dyspnea but no hypoxia or respiratory failure. FP to admit  Final Clinical Impressions(s) / ED Diagnoses   Final diagnoses:  Community acquired pneumonia of left lower lobe of lung ([place])  Pleural effusion    New Prescriptions New Prescriptions   No medications on file     Sherwood Gambler, MD 09/30/16 1553

## 2016-09-30 NOTE — Progress Notes (Signed)
Pharmacy Antibiotic Note  CORNELL BOURBON is a 66 y.o. male admitted on 09/30/2016 with pneumonia. Patient diagnosed on Monday with CAP and started on oral azithromycin. He was given one dose of IM ceftriaxone at the outside hospital. Pharmacy has been consulted for ceftriaxone dosing.  Plan: Ceftriaxone 2g IV q24 hours Azithromycin 500mg  IV q24 hours  Height: 5\' 10"  (177.8 cm) Weight: 238 lb (108 kg) IBW/kg (Calculated) : 73  Temp (24hrs), Avg:99.7 F (37.6 C), Min:99.7 F (37.6 C), Max:99.7 F (37.6 C)   Recent Labs Lab 09/30/16 1222 09/30/16 1305  WBC 22.5*  --   CREATININE 0.71  --   LATICACIDVEN  --  2.21*    Estimated Creatinine Clearance: 113.3 mL/min (by C-G formula based on SCr of 0.71 mg/dL).    No Known Allergies  Antimicrobials this admission: 4/4 ceftriaxone>>  4/4 azithromycin>>   Thank you for allowing pharmacy to be a part of this patient's care.  Jodean Lima Praise Dolecki 09/30/2016 2:13 PM

## 2016-09-30 NOTE — ED Notes (Signed)
Patient transported to CT 

## 2016-10-01 ENCOUNTER — Inpatient Hospital Stay (HOSPITAL_COMMUNITY): Payer: PPO

## 2016-10-01 ENCOUNTER — Encounter (HOSPITAL_COMMUNITY): Payer: Self-pay | Admitting: Student

## 2016-10-01 DIAGNOSIS — R825 Elevated urine levels of drugs, medicaments and biological substances: Secondary | ICD-10-CM

## 2016-10-01 DIAGNOSIS — J189 Pneumonia, unspecified organism: Principal | ICD-10-CM

## 2016-10-01 DIAGNOSIS — R0600 Dyspnea, unspecified: Secondary | ICD-10-CM

## 2016-10-01 DIAGNOSIS — R0602 Shortness of breath: Secondary | ICD-10-CM

## 2016-10-01 DIAGNOSIS — I4892 Unspecified atrial flutter: Secondary | ICD-10-CM

## 2016-10-01 HISTORY — PX: IR THORACENTESIS ASP PLEURAL SPACE W/IMG GUIDE: IMG5380

## 2016-10-01 LAB — LACTATE DEHYDROGENASE, PLEURAL OR PERITONEAL FLUID: LD FL: 439 U/L — AB (ref 3–23)

## 2016-10-01 LAB — BASIC METABOLIC PANEL
ANION GAP: 10 (ref 5–15)
BUN: 12 mg/dL (ref 6–20)
CALCIUM: 8.1 mg/dL — AB (ref 8.9–10.3)
CO2: 24 mmol/L (ref 22–32)
CREATININE: 0.55 mg/dL — AB (ref 0.61–1.24)
Chloride: 104 mmol/L (ref 101–111)
Glucose, Bld: 109 mg/dL — ABNORMAL HIGH (ref 65–99)
Potassium: 4.1 mmol/L (ref 3.5–5.1)
SODIUM: 138 mmol/L (ref 135–145)

## 2016-10-01 LAB — BODY FLUID CELL COUNT WITH DIFFERENTIAL
EOS FL: 7 %
LYMPHS FL: 46 %
Monocyte-Macrophage-Serous Fluid: 8 % — ABNORMAL LOW (ref 50–90)
NEUTROPHIL FLUID: 39 % — AB (ref 0–25)
Total Nucleated Cell Count, Fluid: 2526 cu mm — ABNORMAL HIGH (ref 0–1000)

## 2016-10-01 LAB — GRAM STAIN

## 2016-10-01 LAB — TROPONIN I: Troponin I: <0.03 ng/mL (ref <0.03)

## 2016-10-01 LAB — HEMOGLOBIN A1C
HEMOGLOBIN A1C: 6.1 % — AB (ref 4.8–5.6)
MEAN PLASMA GLUCOSE: 128 mg/dL

## 2016-10-01 LAB — PROTEIN, PLEURAL OR PERITONEAL FLUID: Total protein, fluid: <3 g/dL

## 2016-10-01 LAB — GLUCOSE, PLEURAL OR PERITONEAL FLUID: GLUCOSE FL: 113 mg/dL

## 2016-10-01 LAB — CBC
HCT: 38.4 % — ABNORMAL LOW (ref 39.0–52.0)
HEMOGLOBIN: 12.3 g/dL — AB (ref 13.0–17.0)
MCH: 29.1 pg (ref 26.0–34.0)
MCHC: 32 g/dL (ref 30.0–36.0)
MCV: 91 fL (ref 78.0–100.0)
PLATELETS: 383 10*3/uL (ref 150–400)
RBC: 4.22 MIL/uL (ref 4.22–5.81)
RDW: 13.5 % (ref 11.5–15.5)
WBC: 17.9 10*3/uL — AB (ref 4.0–10.5)

## 2016-10-01 LAB — MRSA PCR SCREENING: MRSA BY PCR: NEGATIVE

## 2016-10-01 LAB — LACTIC ACID, PLASMA: Lactic Acid, Venous: 1.5 mmol/L (ref 0.5–1.9)

## 2016-10-01 LAB — HEPARIN LEVEL (UNFRACTIONATED)
Heparin Unfractionated: 0.19 IU/mL — ABNORMAL LOW (ref 0.30–0.70)
Heparin Unfractionated: 0.43 IU/mL (ref 0.30–0.70)

## 2016-10-01 LAB — ALBUMIN, PLEURAL OR PERITONEAL FLUID: Albumin, Fluid: 1.3 g/dL

## 2016-10-01 LAB — PHOSPHORUS: Phosphorus: 2.5 mg/dL (ref 2.5–4.6)

## 2016-10-01 LAB — MAGNESIUM: Magnesium: 2 mg/dL (ref 1.7–2.4)

## 2016-10-01 MED ORDER — NITROGLYCERIN 0.4 MG SL SUBL
SUBLINGUAL_TABLET | SUBLINGUAL | Status: AC
  Administered 2016-10-01: 0.4 mg
  Filled 2016-10-01: qty 1

## 2016-10-01 MED ORDER — LIDOCAINE HCL (PF) 1 % IJ SOLN
INTRAMUSCULAR | Status: DC | PRN
  Administered 2016-10-01: 10 mL

## 2016-10-01 MED ORDER — IPRATROPIUM-ALBUTEROL 0.5-2.5 (3) MG/3ML IN SOLN: 3.0000 mL | Freq: Four times a day (QID) | RESPIRATORY_TRACT | Status: DC

## 2016-10-01 MED ORDER — HYDROXYZINE HCL 25 MG PO TABS
25.0000 mg | ORAL_TABLET | Freq: Four times a day (QID) | ORAL | Status: DC | PRN
Start: 2016-10-01 — End: 2016-10-02
  Administered 2016-10-01 (×2): 25 mg via ORAL
  Filled 2016-10-01 (×2): qty 1

## 2016-10-01 MED ORDER — METOPROLOL TARTRATE 5 MG/5ML IV SOLN
5.0000 mg | INTRAVENOUS | Status: DC | PRN
  Filled 2016-10-01: qty 5

## 2016-10-01 MED ORDER — SODIUM CHLORIDE 0.9% FLUSH: 3.0000 mL | INTRAVENOUS | Status: DC | PRN

## 2016-10-01 MED ORDER — METOPROLOL TARTRATE 5 MG/5ML IV SOLN
5.0000 mg | INTRAVENOUS | Status: DC | PRN
  Administered 2016-10-01 (×2): 5 mg via INTRAVENOUS
  Filled 2016-10-01 (×2): qty 5

## 2016-10-01 MED ORDER — IBUPROFEN 200 MG PO TABS
400.0000 mg | ORAL_TABLET | Freq: Four times a day (QID) | ORAL | Status: DC | PRN
  Administered 2016-10-01 – 2016-10-02 (×3): 400 mg via ORAL
  Filled 2016-10-01 (×3): qty 2

## 2016-10-01 MED ORDER — IPRATROPIUM BROMIDE 0.02 % IN SOLN: 0.5000 mg | Freq: Four times a day (QID) | RESPIRATORY_TRACT | Status: DC | PRN

## 2016-10-01 MED ORDER — AMIODARONE HCL IN DEXTROSE 360-4.14 MG/200ML-% IV SOLN
60.0000 mg/h | INTRAVENOUS | Status: AC
Start: 2016-10-01 — End: 2016-10-01
  Administered 2016-10-01 (×2): 60 mg/h via INTRAVENOUS
  Filled 2016-10-01 (×2): qty 200

## 2016-10-01 MED ORDER — HEPARIN BOLUS VIA INFUSION
4000.0000 [IU] | Freq: Once | INTRAVENOUS | Status: AC
  Administered 2016-10-01: 4000 [IU] via INTRAVENOUS
  Filled 2016-10-01: qty 4000

## 2016-10-01 MED ORDER — LIDOCAINE HCL 1 % IJ SOLN
INTRAMUSCULAR | Status: AC
  Filled 2016-10-01: qty 20

## 2016-10-01 MED ORDER — HEPARIN (PORCINE) IN NACL 100-0.45 UNIT/ML-% IJ SOLN
2000.0000 [IU]/h | INTRAMUSCULAR | Status: DC
  Administered 2016-10-01: 1600 [IU]/h via INTRAVENOUS
  Filled 2016-10-01 (×2): qty 250

## 2016-10-01 MED ORDER — SODIUM CHLORIDE 0.9 % IV SOLN: INTRAVENOUS | Status: DC

## 2016-10-01 MED ORDER — SODIUM CHLORIDE 0.9 % IV SOLN: 250.0000 mL | INTRAVENOUS | Status: DC

## 2016-10-01 MED ORDER — HYDROCORTISONE 1 % EX CREA
1.0000 "application " | TOPICAL_CREAM | Freq: Three times a day (TID) | CUTANEOUS | Status: DC | PRN
  Filled 2016-10-01: qty 28

## 2016-10-01 MED ORDER — AMIODARONE LOAD VIA INFUSION
150.0000 mg | Freq: Once | INTRAVENOUS | Status: AC
  Administered 2016-10-01: 150 mg via INTRAVENOUS
  Filled 2016-10-01: qty 83.34

## 2016-10-01 MED ORDER — SODIUM CHLORIDE 0.9% FLUSH
3.0000 mL | Freq: Two times a day (BID) | INTRAVENOUS | Status: DC
  Administered 2016-10-01: 3 mL via INTRAVENOUS

## 2016-10-01 MED ORDER — AMIODARONE HCL IN DEXTROSE 360-4.14 MG/200ML-% IV SOLN
30.0000 mg/h | INTRAVENOUS | Status: DC
  Administered 2016-10-01: 30 mg/h via INTRAVENOUS
  Filled 2016-10-01: qty 200

## 2016-10-01 MED ORDER — GI COCKTAIL ~~LOC~~
30.0000 mL | Freq: Once | ORAL | Status: AC
  Administered 2016-10-01: 30 mL via ORAL
  Filled 2016-10-01: qty 30

## 2016-10-01 NOTE — Progress Notes (Addendum)
FPTS Interim Progress Note  S: Paged by RN that patient having chest pain, diaphoresis and still tachycardic to 130s after IV lopressor 5mg  x2 and that rapid response called. Patient seen and examined. States his chest tightness has acutely worsened and also broke out into a sweat. Chest tightness is central, not relieved by nitro. Also having some indigestion.  O: BP (!) 129/92 (BP Location: Right Arm)   Pulse (!) 140   Temp 98.3 F (36.8 C) (Oral)   Resp 18   Ht 5\' 10"  (1.778 m)   Wt 240 lb (108.9 kg)   SpO2 93%   BMI 34.44 kg/m   General: Sitting up on side of bed leaning on table. Appears diaphoretic and uncomfortable. Nasal cannula in place CV: tachycardic, regular,normal S1 and S2 Lungs: CTAB with some increased work of breathing  A/P: Aflutter with chest pain now with HR persistently in 140s with BP in 90s/60s (MAP 70-80s) - transfer to stepdown - stat troponin - stat CXR  - Cardiology consult - continue heparin drip  Bufford Lope, DO 10/01/2016, 7:24 AM PGY-1, [place] [facility] pager [phone]

## 2016-10-01 NOTE — Progress Notes (Signed)
Nutrition Brief Note  Patient identified on the Malnutrition Screening Tool (MST) Report. Patient sleeping during RD visit. Spoke with wife who reports patient has been eating well. He has been trying to lose weight, and has also lost weight due to fluids. He has a great appetite and has been eating "everything in sight."  Wt Readings from Last 15 Encounters:  10/01/16 240 lb (108.9 kg)  09/27/15 210 lb (95.3 kg)  04/24/15 247 lb 9.6 oz (112.3 kg)  10/22/14 261 lb 12.8 oz (118.8 kg)  04/17/14 261 lb (118.4 kg)  02/26/14 264 lb (119.7 kg)  11/22/13 254 lb (115.2 kg)  11/18/13 261 lb 8 oz (118.6 kg)  03/29/13 258 lb (117 kg)  12/28/12 264 lb 9.6 oz (120 kg)  10/18/12 255 lb 6.4 oz (115.8 kg)  09/20/12 251 lb (113.9 kg)  09/14/12 251 lb 12.8 oz (114.2 kg)  09/04/12 253 lb 15.5 oz (115.2 kg)    Body mass index is 34.44 kg/m. Patient meets criteria for obesity based on current BMI.   Current diet order is heart healthy, patient is consuming approximately 100% of meals at this time. Labs and medications reviewed.   No nutrition interventions warranted at this time. If nutrition issues arise, please consult RD.   Molli Barrows, RD, LDN, [place] Pager [phone] After Hours Pager [phone]

## 2016-10-01 NOTE — Progress Notes (Signed)
Spoke with Dr. Yisroel Ramming concerning order for paracentesis.  Order changed to thoracentesis.  Dyann Ruddle from interventional radiology notified and stated they would get patient later today.  Also, notified Dyann Ruddle that patient in IV amiodarone drip for Aflutter with rapid response and IV heparin. Sanda Linger

## 2016-10-01 NOTE — Telephone Encounter (Signed)
lmom for pt to call back

## 2016-10-01 NOTE — Consult Note (Signed)
Patient ID: Brian [name] MRN: [id number], DOB/AGE: 12-29-1950   Admit date: 09/30/2016  Reason for Consult: Atrial Flutter and Chest Pain Requesting Physician: Dr, Ardelia Mems, Family Medicine    Primary Physician: No PCP Per Patient Primary Cardiologist: Previously Dr. Mare Ferrari Dr. Marlou Porch ([place])  Pt. Profile:  Brian [name] is a 66 y.o. male with a PMH of atrial flutter, on chronic anticoagulation therapy with Xarelto, chronic diastolic HF, HTN and tobacco abuse, who is being seen today for the evaluation of atrial flutter w/ RVR and chest pain at the request of Dr. Ardelia Mems, [facility].   Problem List  Past Medical History:  Diagnosis Date  . Anxiety   . Atrial flutter ([place])    a. 08/2012  . CHF (congestive heart failure) ([place])   . Hypertension   . Obesity   . Shortness of breath   . Tobacco abuse     Past Surgical History:  Procedure Laterality Date  . MICROLARYNGOSCOPY  09/02/2007   with excision of right vocal cord mass, Dr. Constance Holster     Allergies  Allergies  Allergen Reactions  . Tape Rash    Paper tape is preferred, please!!    HPI  Brian [name] is a 66 y.o. male with a PMH of atrial flutter, on chronic anticoagulation therapy with Xarelto, chronic diastolic HF, HTN and tobacco abuse, who is being seen today for the evaluation of atrial flutter w/ RVR and chest pain at the request of Dr. Ardelia Mems, [facility].   He was being followed by Dr. Mare Ferrari, but was lost to f/u after his retirement. He has not been seen by our practice since October 2016. There is no known h/o CAD. His last 2D echo 11/17/13 showed mild LVH with normal systolic function with an estimated EF of 50-55% with G1DD. No wall motion abnormalities. He is on Xarelto for a/c given a CHA2DS2 VASc score of 3 for HF, HTN and age 23-74.   He presented to the [facility] ED on 09/30/16 with a CC of SOB. Per patient, he was seen 3 days prior at [facility] for the same complaint and was  treated for CAP. He was given antibiotics, however his respiratory status worsened, prompting him to present to [facility]. In ED, he was noted to be tachycardic and tachypneic. Lung exam abnormal with wheezes. WBC elevated at 22.5. BNP 153. Lactic Acid 2.21. Troponin negative. CXR showed left lower lobe process likely a combination of infiltrate or atelectasis and left pleural effusion. CTA showing pleural effusion as well as a small pericardial effusion. He was admitted by Internal Medicine and placed on antibiotics. His hospital course has been complicated by atrial flutter w/ RVR. EKG earlier today showed Atrial flutter with 2:1 A-V conduction, with rate of 159 bpm. Pt was symptomatic with CP at that time. He was given IV metoprolol, 5 mg x 2. SBP dropped into the 90s. Pt was placed on IV heparin and transferred to telemetry. Apparently, the patient has been off of his Xarelto for the past several days for a possible paracentesis. Cardiology asked to assist with management of his arrthymia.    Home Medications  Prior to Admission medications   Medication Sig Start Date End Date Taking? Authorizing Provider  furosemide (LASIX) 40 MG tablet Take 40 mg by mouth daily as needed for fluid or edema.   Yes Historical Provider, MD  naproxen (NAPROSYN) 500 MG tablet Take 1 tablet (500 mg total) by mouth 2 (two) times  daily between meals as needed for moderate pain. 09/28/15  Yes Lavina Hamman, MD  diltiazem (CARDIZEM CD) 180 MG 24 hr capsule Take 1 capsule (180 mg total) by mouth daily. Patient not taking: Reported on 09/30/2016 04/24/15   Isaiah Serge, NP  predniSONE (DELTASONE) 10 MG tablet Take 40mg  daily for 3days,Take 30mg  daily for 3days,Take 20mg  daily for 3days,Take 10mg  daily for 3days, then stop Patient not taking: Reported on 09/30/2016 09/28/15   Lavina Hamman, MD  rivaroxaban (XARELTO) 20 MG TABS tablet Take 1 tablet (20 mg total) by mouth daily with supper. 01/17/16   Isaiah Serge, NP    Hospital  Medications  . famotidine  20 mg Oral Daily  . levofloxacin (LEVAQUIN) IV  750 mg Intravenous Q24H  . sodium chloride flush  3 mL Intravenous Q12H   . heparin 1,600 Units/hr (10/01/16 0645)   sodium chloride, acetaminophen **OR** acetaminophen, furosemide, ondansetron **OR** ondansetron (ZOFRAN) IV, polyethylene glycol, sodium chloride flush  Family History  Family History  Problem Relation Age of Onset  . Cancer Mother   . Heart disease Father   . Heart attack Father   . Cancer Father   . Diabetes Sister     Social History  Social History   Social History  . Marital status: Divorced    Spouse name: N/A  . Number of children: N/A  . Years of education: N/A   Occupational History  . Not on file.   Social History Main Topics  . Smoking status: Current Some Day Smoker    Packs/day: 1.00    Years: 50.00    Types: Cigarettes  . Smokeless tobacco: Never Used     Comment: quit 11/04/12  . Alcohol use Yes     Comment: socially  . Drug use: No  . Sexual activity: Not Currently   Other Topics Concern  . Not on file   Social History Narrative  . No narrative on file     Review of Systems General:  No chills, fever, night sweats or weight changes.  Cardiovascular:  No chest pain, dyspnea on exertion, edema, orthopnea, palpitations, paroxysmal nocturnal dyspnea. Dermatological: No rash, lesions/masses Respiratory: No cough, dyspnea Urologic: No hematuria, dysuria Abdominal:   No nausea, vomiting, diarrhea, bright red blood per rectum, melena, or hematemesis Neurologic:  No visual changes, wkns, changes in mental status. All other systems reviewed and are otherwise negative except as noted above.  Physical Exam  Blood pressure 97/75, pulse 77, temperature 98.8 F (37.1 C), temperature source Oral, resp. rate 16, height 5\' 10"  (1.778 m), weight 240 lb (108.9 kg), SpO2 95 %.  General: Pleasant, NAD Psych: Normal affect. Neuro: Alert and oriented X 3. Moves all  extremities spontaneously. HEENT: Normal  Neck: Supple without bruits or JVD. Lungs:  Resp regular and unlabored, decreased BS bilaerally, faint wheezing Heart: irregular rhythm, tachy rate no s3, s4, or murmurs. Abdomen: Soft, non-tender, non-distended, BS + x 4.  Extremities: No clubbing, cyanosis or edema. DP/PT/Radials 2+ and equal bilaterally.  Labs  Troponin [facility] Test)  Recent Labs  09/30/16 1247  TROPIPOC 0.00    Recent Labs  09/30/16 1901 09/30/16 2351 10/01/16 0554 10/01/16 0711  TROPONINI <0.03 <0.03 <0.03 <0.03   Lab Results  Component Value Date   WBC 17.9 (H) 10/01/2016   HGB 12.3 (L) 10/01/2016   HCT 38.4 (L) 10/01/2016   MCV 91.0 10/01/2016   PLT 383 10/01/2016     Recent Labs Lab 10/01/16  0554  NA 138  K 4.1  CL 104  CO2 24  BUN 12  CREATININE 0.55*  CALCIUM 8.1*  GLUCOSE 109*   Lab Results  Component Value Date   CHOL 135 09/28/2015   HDL 34 (L) 09/28/2015   LDLCALC 76 09/28/2015   TRIG 126 09/28/2015   No results found for: DDIMER   Radiology/Studies  Dg Chest 2 View  Result Date: 09/30/2016 CLINICAL DATA:  Cough and congestion. EXAM: CHEST  2 VIEW COMPARISON:  09/27/2015 FINDINGS: The cardiac silhouette, mediastinal and hilar contours are stable. Left lower lobe process most likely a combination of effusion, atelectasis and/or infiltrate. Right basilar atelectasis is also noted. No findings for pulmonary edema. IMPRESSION: Left lower lobe process likely a combination of infiltrate or atelectasis and left pleural effusion. Electronically Signed   By: Marijo Sanes M.D.   On: 09/30/2016 12:45   Ct Angio Chest Pe W Or Wo Contrast  Result Date: 09/30/2016 CLINICAL DATA:  Diagnosed with pneumonia Monday but has not been taking blood thinners x8 days. Current smoker. Pleural effusion. Congestive heart failure EXAM: CT ANGIOGRAPHY CHEST WITH CONTRAST TECHNIQUE: Multidetector CT imaging of the chest was performed using the standard  protocol during bolus administration of intravenous contrast. Multiplanar CT image reconstructions and MIPs were obtained to evaluate the vascular anatomy. CONTRAST:  100 cc Isovue 370 IV COMPARISON:  09/30/2016 CXR, 09/27/2015 CXR, 04/17/2014 chest CT FINDINGS: Cardiovascular: Normal size cardiac chambers with small circumferential pericardial effusion measuring between 1.6 and 1.8 cm in thickness cm. No aortic aneurysm or dissection. Aortic atherosclerosis at the arch. No large central pulmonary embolus. Mediastinum/Nodes: Mildly enlarged, potentially reactive mediastinal lymph nodes in the subcarinal, pretracheal and prevascular portions of the mediastinum, the largest approximately 13 mm in the prevascular space. Thyroid gland, trachea, and esophagus demonstrate no significant findings. Lungs/Pleura: Moderate left effusion extending to the lung apex with adjacent compressive atelectasis and probable lingular atelectasis or scarring. No dominant mass is identified. No pneumonic consolidation. Upper Abdomen: No acute abnormality. Musculoskeletal: Healed fracture deformity of the upper sternum. Minor degenerative changes along the dorsal spine. No acute nor suspicious osseous abnormality. Review of the MIP images confirms the above findings. IMPRESSION: 1. No acute pulmonary embolus. 2. Small circumferential pericardial effusion measuring up to 1.8 cm thickness. 3. Moderate left effusion with adjacent compressive atelectasis. 4. Degenerative changes along the dorsal spine. Healed fracture deformity of the upper sternum. Electronically Signed   By: Ashley Royalty M.D.   On: 09/30/2016 18:00   Dg Chest Port 1 View  Result Date: 10/01/2016 CLINICAL DATA:  Patient with shortness of breath. EXAM: PORTABLE CHEST 1 VIEW COMPARISON:  Chest CT 09/30/2016; chest radiograph 09/30/2016 FINDINGS: Monitoring leads overlie the patient. Stable enlarged cardiac and mediastinal contours. Persistent moderate layering left pleural  effusion with underlying pulmonary consolidation. Minimal heterogeneous opacities right lung base. No pneumothorax. Osseous skeleton is unremarkable. IMPRESSION: Persistent moderate left pleural effusion with underlying consolidation. Electronically Signed   By: Lovey Newcomer M.D.   On: 10/01/2016 07:49    ECG   Atrial flutter with 2:1 A-V conduction 159 bpm-- personally reviewed  Telemetry  Atrial flutter w/ RVR 160 bpm -- personally reviewed   Echocardiogram pending  ASSESSMENT AND PLAN  Active Problems:   Atrial flutter with rapid ventricular response (HCC)   CAP (community acquired pneumonia)   1. Atrial Flutter w/ RVR: this is not a first time occurrence. Patient has prior h/o atrial flutter. His recurrence and rapid ventricular response is  likely driven by his underlying pulmonary issues/ acute CAP. He is also needing w/u to exclude COPD. He is a chronic smoker. PFTs pending. Continue to manage his PNA and hopefully things will improve. However, atrial flutter is difficult to rate control and it is likely that he will need a TEE cardioversion given his rate and soft BP. VR currently in the 160s. SBP currently in the upper 90s. His BP dropped after IV metoprolol earlier. He is currently CP free. We will try to arrange TEE/Cardioversion today. If unable to schedule, we will use IV amiodarone. We will need to be cautious with long term use of amiodarone given his underlying pulmonary issues. May consider EP consult for consideration of other antiarrhythmics, if he fails cardioversion. Continue IV heparin for now. He is ordered to get a paracentesis. Also, avoid use of PRN albuterol. If a PRN bronchodilator is needed, recommend use of Xopenex to prevent reflex tachycardia. Continue to monitor on telemetry. We will continue to follow along with you.   2. CAP: management per Family Medicine.   Signed, Lyda Jester, PA-C, MHS 10/01/2016, 9:27 AM [facility] Pager:  [phone]  Personally seen and examined. Agree with above.  66 year old male with history of atrial flutter on Xarelto with chronic diastolic heart failure, hypertension, smoker, morbid obesity who came in with shortness of breath and is currently being treated for community-acquired pneumonia. While here in the hospital his heart rate was first sinus tachycardia at approximately 100-110 bpm and then transition to atrial flutter earlier this morning on 10/01/16 at 160 bpm. He was having chest discomfort, elephant sitting on his chest, which has subsequently resolved. He still remains with a heart rate at 158 bpm.  Physical exam reveals an obese male, lungs are with normal respiratory effort, tachycardic and regular without any appreciable murmurs. Nonfocal neuro. Mild cough.   Atrial flutter  - We will begin IV amiodarone, hopefully short-term. Likely being exacerbated by his pneumonia.  - He received 3 doses of IV Lopressor, blood pressure decreased. Nitroglycerin may have exacerbated this as well.  - If he does not convert, we have set him up for TEE cardioversion tomorrow. I would feel comfortable converting him on anticoagulation, IV heparin then transitioning him over to Xarelto.  - He is on IV heparin currently. He had stopped his Xarelto at home a few days ago. Questionable why, maybe he thought that he may need a paracentesis? No physician directed him.  Chronic diastolic heart failure  - May be slightly fluid overloaded on exam however currently hypotensive. Continue to monitor.  Anticoagulation  - Currently using IV heparin. Transition to Xarelto when able.  Pericardial effusion  - Small. Should be of no clinical significance. Possibly reactive/inflammatory from pneumonia.  Candee Furbish, MD

## 2016-10-01 NOTE — Progress Notes (Signed)
ANTICOAGULATION CONSULT NOTE - Initial Consult  Pharmacy Consult for Heparin Indication: atrial flutter  Allergies  Allergen Reactions  . Tape Rash    Paper tape is preferred, please!!    Patient Measurements: Height: 5\' 10"  (177.8 cm) Weight: 238 lb (108 kg) IBW/kg (Calculated) : 73 Heparin Dosing Weight: 100 kg  Vital Signs: Temp: 98.2 F (36.8 C) (04/04 2145) Temp Source: Oral (04/04 2145) BP: 106/72 (04/04 2145) Pulse Rate: 96 (04/04 2145)  Labs:  Recent Labs  09/30/16 1222 09/30/16 1901 09/30/16 2351  HGB 12.9*  --   --   HCT 40.1  --   --   PLT 423*  --   --   LABPROT 14.6  --   --   INR 1.13  --   --   CREATININE 0.71  --   --   TROPONINI  --  <0.03 <0.03    Estimated Creatinine Clearance: 113.3 mL/min (by C-G formula based on SCr of 0.71 mg/dL).   Medical History: Past Medical History:  Diagnosis Date  . Anxiety   . Atrial flutter ([place])    a. 08/2012  . CHF (congestive heart failure) ([place])   . Hypertension   . Obesity   . Shortness of breath   . Tobacco abuse     Medications:  Prescriptions Prior to Admission  Medication Sig Dispense Refill Last Dose  . furosemide (LASIX) 40 MG tablet Take 40 mg by mouth daily as needed for fluid or edema.   Past Week at Unknown time  . naproxen (NAPROSYN) 500 MG tablet Take 1 tablet (500 mg total) by mouth 2 (two) times daily between meals as needed for moderate pain. 20 tablet 0 PRN at PRN  . diltiazem (CARDIZEM CD) 180 MG 24 hr capsule Take 1 capsule (180 mg total) by mouth daily. (Patient not taking: Reported on 09/30/2016) 30 capsule 6 Not Taking at Unknown time  . predniSONE (DELTASONE) 10 MG tablet Take 40mg  daily for 3days,Take 30mg  daily for 3days,Take 20mg  daily for 3days,Take 10mg  daily for 3days, then stop (Patient not taking: Reported on 09/30/2016) 30 tablet 0 Not Taking at Unknown time  . rivaroxaban (XARELTO) 20 MG TABS tablet Take 1 tablet (20 mg total) by mouth daily with supper. 30 tablet 3 ON HOLD  at ON HOLD    Assessment: 66 y.o. male admitted with CAP/pleural effusion, h/o AFlutter and Xarelto on hold, for heparin. Noted pt has not taken his Xarelto for > 1 week.  Goal of Therapy:  Heparin level 0.3-0.7 units/ml Monitor platelets by anticoagulation protocol: Yes   Plan:  Heparin 4000 units IV bolus, then start heparin 1600 units/hr Check heparin level in 6 hours  Namish Krise, Bronson Curb 10/01/2016,6:06 AM

## 2016-10-01 NOTE — Progress Notes (Signed)
Family Medicine Teaching Service Daily Progress Note Intern Pager: [phone]  Patient name: Brian [name] Medical record number: [id number] Date of birth: August 27, 1950 Age: 66 y.o. Gender: male  Primary Care Provider: No PCP Per Patient Consultants: Cardiology Code Status: Full, however patient only wants to be intubated for a temporary amount of time if necessary   Pt Overview and Major Events to Date:  4/4- Admit to FPTS. Started on Levoquin 750 mg IV daily and duonebs. CXR was ordered that showed pleural effusion. CTA showed small pericardial effusion so echo and IR thoracocentesis were ordered. 4/5- EKG showed aflutter and pt was tachy to the 160s. Cards c/s and started on IV heparin and IV amiodarone. TEE cardioversion scheduled for tomorrow. IR thoracocentesis today.   Assessment and Plan: Brian [name] is a 66 y.o. male presenting with shortness of breath . PMH is significant for diastolic CHF, atrial flutter, HTN, CVA, cocaine use and obesity.   Shortness of Breath likely from CAP and new left pleural effusion: Physical exam today notable for resolved wheezing in anterior lung fields with improved air movement in the posterior upper lobes bilaterally but decreased breath sounds in the lower lobes posteriorly, no crackles. 1+ pitting edema from ankles to mid shin. WBC was 17.9, improved from 22.5. Lactic acid 1.5 improved from 2.21. Troponin 0<0.03<0.03. Symptoms most likely 2/2 pneumonia with pleural effusion. Differentials include acute CHF exacerbation (although less likely given mildly elevated BNP) and acute COPD exacerbation (no official diagnosis for COPD however if a long term smoker).  - Transfer to stepdown unit this morning after new a flutter (see below A/P) - Vitals per floor protocol - Echo to further characterize pericardial effusion  - Continue Levoquin 750 mg IV daily  - Discontinue Duonebs q4h as Albuterol can worsen tachycardia - Start Ipratropium q6h PRN for  wheezing and SOB - Follow up on blood culture drawn in ED - IR thoracocentesis today with body fluid labs - Monitor respiratory status, continuous pulse ox - AM CBC and BMP  Atrial flutter: History of atrial flutter. EKG overnight showed aflutter and the patient was tachy to the 160s  with hypotension to 97/75. During this episode he experienced dyspnea, mid-sternal chest pain, diaphoresis. He also endorsed feeling heartburn during this episode. He was given IV Heparin this morning as well as 2 doses of metoprolol 5 mg for rate control and sublingual nitroglycerin.  He remained afebrile with respiratory rate in the high teens, no oxygen requirement. PT/INR in the ED was normal. On Xarelto at home, hasn't been on it in 9 days for as pt thought he may have to have a procedure because of his respiratory symptoms. He was not told by a physician that he was going to have a procedure.   - Cardiology c/s, appreciate reccs. Will continue to monitor and follow  - IV amiodarone and Heparin started per cardiology  - TEE cardioversion tomorrow. Cards is okay converting him on anticoagulation per chart  - Resume anticoagulation with home Xarelto prior to discharge - continuous cardiac monitoring - Trend troponins  HTN: History of HTN and currently only taking Lasix. Per his chart review he was on home Lasix 40 mg daily. He was hypertensive in the ED today. Blood pressure ranged from 67-591 systolic and 63-84 diastolic today. - Continue home Lasix 40 mg daily  - Heart healthy diet   Hx of HFpEF: History of diastolic CHF. BNP in the ED was 153.5. Per chart review he was supposed to be  taking Diltiazem 180 mg daily. Is not currently taking his medication. Endorses orthopnea at home and has to sleep at a 45 degrees angle or when he lies on his side. Last echo was April 2017 which showed normal EF and no signs of heart failure, the echo before that in 2015 did show G1DD. - Cardiology c/s, appreciate reccs. Will  continue to monitor and follow  - Continue Lasix 40 mg daily  - Heart healthy diet - Echo   Pericardial effusion: Small, seen on CTA. Cards doesn't think his pericardial effusion is of clinical significance. Possibly reactive/inflammatory from pneumonia.  - Echo as above - Cardiology c/s, appreciate reccs. Will continue to monitor and follow   Tobacco abuse: 50 pack year smoking history. Has tried to quit in the past but because of family issues started again. Used to smoke a pack a day but now is down to 3-4 cigarettes per day. - Provide counseling for smoking cessation - Offered him a nicotine patch, but he refused. Made him aware that he could request it if he felt as though he needed it.     Elevated Lactic Acid, resolved: Lactic acid was 1.5, down from 2.21 in the ED. Received 500 cc bolus in the ED.   Hx of Cocaine Abuse: Patient denies cocaine abuse however appears slightly manic on exam.  - UDS negative for cocaine, positive for opiates.   Opioid Use: Positive for opioids on UDS. Per [employer] he has no prescription on file for opiates. Did not admit to using on admission.  - Will need to address with patient at some point - Try avoiding opioids while hospitalized   FEN/GI: Heart healthy diet PPx: Heparin   Disposition: Pending results of IR thoracocentesis and resolution of SOB and Aflutter  Subjective:  Patient well appearing and lying on side in bed. Endorses trouble breathing with exertion and when lying down on his back. No pain.  No other complaints   Objective: Temp:  [98.2 F (36.8 C)-99.7 F (37.6 C)] 98.3 F (36.8 C) (04/05 0600) Pulse Rate:  [96-140] 140 (04/05 0600) Resp:  [18-28] 18 (04/05 0600) BP: (105-177)/(72-154) 129/92 (04/05 0600) SpO2:  [93 %-98 %] 93 % (04/05 0600) Weight:  [108 kg (238 lb)-108.9 kg (240 lb)] 108.9 kg (240 lb) (04/05 0600) Physical Exam: General: Well appearing, lying on side on bed, in NAD. Cardiovascular: Moderate  tachycardia, normal rhythm. No murmurs, rubs or gallops appreciated. Radial and dorsalis pulses 2+. 1+ pitting edema from ankles to mid shin Respiratory: No wheezing anteriorly, posteriorly he had good air movement in the upper lobes bilaterally and decreased breath sounds in the lower lobes bilaterally Abdomen: Normoactive bowel sounds. No TTP. No distention or masses.  Laboratory:  Results for orders placed or performed during the hospital encounter of 09/30/16 (from the past 24 hour(s))  Urine rapid drug screen (hosp performed)     Status: Abnormal   Collection Time: 09/30/16  5:30 PM  Result Value Ref Range   Opiates POSITIVE (A) NONE DETECTED   Cocaine NONE DETECTED NONE DETECTED   Benzodiazepines NONE DETECTED NONE DETECTED   Amphetamines NONE DETECTED NONE DETECTED   Tetrahydrocannabinol NONE DETECTED NONE DETECTED   Barbiturates NONE DETECTED NONE DETECTED  Troponin I     Status: None   Collection Time: 09/30/16  7:01 PM  Result Value Ref Range   Troponin I <0.03 <0.03 ng/mL  Hemoglobin A1c     Status: Abnormal   Collection Time: 09/30/16  7:01 PM  Result Value Ref Range   Hgb A1c MFr Bld 6.1 (H) 4.8 - 5.6 %   Mean Plasma Glucose 128 mg/dL  Troponin I     Status: None   Collection Time: 09/30/16 11:51 PM  Result Value Ref Range   Troponin I <0.03 <0.03 ng/mL  Troponin I     Status: None   Collection Time: 10/01/16  5:54 AM  Result Value Ref Range   Troponin I <0.03 <0.03 ng/mL  Basic metabolic panel     Status: Abnormal   Collection Time: 10/01/16  5:54 AM  Result Value Ref Range   Sodium 138 135 - 145 mmol/L   Potassium 4.1 3.5 - 5.1 mmol/L   Chloride 104 101 - 111 mmol/L   CO2 24 22 - 32 mmol/L   Glucose, Bld 109 (H) 65 - 99 mg/dL   BUN 12 6 - 20 mg/dL   Creatinine, Ser 0.55 (L) 0.61 - 1.24 mg/dL   Calcium 8.1 (L) 8.9 - 10.3 mg/dL   GFR calc non Af Amer >60 >60 mL/min   GFR calc Af Amer >60 >60 mL/min   Anion gap 10 5 - 15  CBC     Status: Abnormal    Collection Time: 10/01/16  5:54 AM  Result Value Ref Range   WBC 17.9 (H) 4.0 - 10.5 K/uL   RBC 4.22 4.22 - 5.81 MIL/uL   Hemoglobin 12.3 (L) 13.0 - 17.0 g/dL   HCT 38.4 (L) 39.0 - 52.0 %   MCV 91.0 78.0 - 100.0 fL   MCH 29.1 26.0 - 34.0 pg   MCHC 32.0 30.0 - 36.0 g/dL   RDW 13.5 11.5 - 15.5 %   Platelets 383 150 - 400 K/uL  Troponin I     Status: None   Collection Time: 10/01/16  7:11 AM  Result Value Ref Range   Troponin I <0.03 <0.03 ng/mL  MRSA PCR Screening     Status: None   Collection Time: 10/01/16  9:07 AM  Result Value Ref Range   MRSA by PCR NEGATIVE NEGATIVE  Magnesium     Status: None   Collection Time: 10/01/16  9:58 AM  Result Value Ref Range   Magnesium 2.0 1.7 - 2.4 mg/dL  Phosphorus     Status: None   Collection Time: 10/01/16  9:58 AM  Result Value Ref Range   Phosphorus 2.5 2.5 - 4.6 mg/dL  Lactic acid, plasma     Status: None   Collection Time: 10/01/16  9:58 AM  Result Value Ref Range   Lactic Acid, Venous 1.5 0.5 - 1.9 mmol/L    Imaging/Diagnostic Tests: Ct Angio Chest Pe W Or Wo Contrast  Result Date: 09/30/2016 CLINICAL DATA:  Diagnosed with pneumonia Monday but has not been taking blood thinners x8 days. Current smoker. Pleural effusion. Congestive heart failure EXAM: CT ANGIOGRAPHY CHEST WITH CONTRAST TECHNIQUE: Multidetector CT imaging of the chest was performed using the standard protocol during bolus administration of intravenous contrast. Multiplanar CT image reconstructions and MIPs were obtained to evaluate the vascular anatomy. CONTRAST:  100 cc Isovue 370 IV COMPARISON:  09/30/2016 CXR, 09/27/2015 CXR, 04/17/2014 chest CT FINDINGS: Cardiovascular: Normal size cardiac chambers with small circumferential pericardial effusion measuring between 1.6 and 1.8 cm in thickness cm. No aortic aneurysm or dissection. Aortic atherosclerosis at the arch. No large central pulmonary embolus. Mediastinum/Nodes: Mildly enlarged, potentially reactive mediastinal  lymph nodes in the subcarinal, pretracheal and prevascular portions of the mediastinum, the largest approximately 13 mm in  the prevascular space. Thyroid gland, trachea, and esophagus demonstrate no significant findings. Lungs/Pleura: Moderate left effusion extending to the lung apex with adjacent compressive atelectasis and probable lingular atelectasis or scarring. No dominant mass is identified. No pneumonic consolidation. Upper Abdomen: No acute abnormality. Musculoskeletal: Healed fracture deformity of the upper sternum. Minor degenerative changes along the dorsal spine. No acute nor suspicious osseous abnormality. Review of the MIP images confirms the above findings. IMPRESSION: 1. No acute pulmonary embolus. 2. Small circumferential pericardial effusion measuring up to 1.8 cm thickness. 3. Moderate left effusion with adjacent compressive atelectasis. 4. Degenerative changes along the dorsal spine. Healed fracture deformity of the upper sternum. Electronically Signed   By: Ashley Royalty M.D.   On: 09/30/2016 18:00   Dg Chest Port 1 View  Result Date: 10/01/2016 CLINICAL DATA:  Patient with shortness of breath. EXAM: PORTABLE CHEST 1 VIEW COMPARISON:  Chest CT 09/30/2016; chest radiograph 09/30/2016 FINDINGS: Monitoring leads overlie the patient. Stable enlarged cardiac and mediastinal contours. Persistent moderate layering left pleural effusion with underlying pulmonary consolidation. Minimal heterogeneous opacities right lung base. No pneumothorax. Osseous skeleton is unremarkable. IMPRESSION: Persistent moderate left pleural effusion with underlying consolidation. Electronically Signed   By: Lovey Newcomer M.D.   On: 10/01/2016 07:49   Colon Flattery, [place] [facility], MS4  I have separately seen and examined the patient. I have discussed the findings and exam with medical student Ashleyand agree with the above note. My changes/additions are outlined in BLUE. Additionally:    Patient is a 66 year old male with history of hypertension, a flutter, tobacco abuse who presented with shortness of breath in the setting of being treated for 3 days in outpatient setting with community-acquired pneumonia. Episode of aflutter overnight and this morning.   Physical Exam  General: Well appearing, sitting up in bed, in NAD.  Cardiovascular: Tachycardia, irregular rhythm. No murmurs, rubs or gallops appreciated. Radial and dorsalis pulses 2+. 1+ pitting edema from ankles to mid shin Respiratory: Wheezing anteriorly, posteriorly he had decreased breath sounds bilaterally but improved from yesterday  Gastrointestinal: Normoactive bowel sounds. No TTP. No distention or masses. Psych: Pleasant mood, rapid speech  A/P Community acquired pneumonia with left pleural effusion: Failed outpatient treatment with ceftriaxone and azithromycin. Still tachypneic. No hypoxia.  -Continue IV Levaquin -Change Duonebs to ipratropium due to tachycardia -Monitor respiratory status -IR thoracentesis today - CXR tomorrow  A Flutter: Has hx of a flutter but was in NSR until this morning. Associated with hypotension.  - Cardiology consulted, appreciated their assistance - TEE/Cardioversion tomorrow - IV Heparin - IV amiodarone - Xarelto on discharge  Pericardial effusion: Seen on CTA. Small and likely of no clinical significance per cardiology - Echo  Smitty Cords, MD [place], PGY-2

## 2016-10-01 NOTE — Progress Notes (Signed)
FPTS Interim Progress Note  S: Went to examined patient given that he went into aflutter around 5am this morning. Patient states that he feels some chest tightness and short of breath. Denies palpitations.   O: BP (!) 129/92 (BP Location: Right Arm)   Pulse (!) 140   Temp 98.3 F (36.8 C) (Oral)   Resp 18   Ht 5\' 10"  (1.778 m)   Wt 240 lb (108.9 kg)   SpO2 93%   BMI 34.44 kg/m    General: Lying in bed, appears mildly uncomfortable speaking in short sentences. In NAD CV: tachycardic, regular,normal S1 and S2  EKG showing atrial flutter with 2:1 conduction  A/P: Atrial flutter - IV Lopressor 5mg  prn for HR >110 for a max of 3 doses - Start heparin drip per pharmacy for anticoagulation  Bufford Lope, DO 10/01/2016, 6:41 AM PGY-1, [facility] Service pager [phone]

## 2016-10-01 NOTE — Procedures (Addendum)
PROCEDURE SUMMARY:  Successful US guided diagnostic and therapeutic left thoracentesis. Yielded 1.3 liters of blood-tinged fluid. Pt tolerated procedure well. No immediate complications.  Procedure was stopped prior to removal of all fluid due to patient discomfort.  Specimen was sent for labs. CXR ordered.  Docia Barrier PA-C 10/01/2016 3:42 PM

## 2016-10-01 NOTE — Progress Notes (Signed)
[place] for Heparin Indication: atrial flutter  Allergies  Allergen Reactions  . Tape Rash    Paper tape is preferred, please!!    Patient Measurements: Height: 5\' 10"  (177.8 cm) Weight: 240 lb (108.9 kg) IBW/kg (Calculated) : 73 Heparin Dosing Weight: 100 kg  Vital Signs: Temp: 98.8 F (37.1 C) (04/05 0900) Temp Source: Oral (04/05 0900) BP: 132/89 (04/05 1233) Pulse Rate: 114 (04/05 1233)  Labs:  Recent Labs  09/30/16 1222  09/30/16 2351 10/01/16 0554 10/01/16 0711 10/01/16 1412  HGB 12.9*  --   --  12.3*  --   --   HCT 40.1  --   --  38.4*  --   --   PLT 423*  --   --  383  --   --   LABPROT 14.6  --   --   --   --   --   INR 1.13  --   --   --   --   --   HEPARINUNFRC  --   --   --   --   --  0.19*  CREATININE 0.71  --   --  0.55*  --   --   TROPONINI  --   < > <0.03 <0.03 <0.03  --   < > = values in this interval not displayed.  Estimated Creatinine Clearance: 113.8 mL/min (A) (by C-G formula based on SCr of 0.55 mg/dL (L)).   Medical History: Past Medical History:  Diagnosis Date  . Anxiety   . Atrial flutter ([place])    a. 08/2012  . CHF (congestive heart failure) ([place])   . Hypertension   . Obesity   . Shortness of breath   . Tobacco abuse     Medications:  Prescriptions Prior to Admission  Medication Sig Dispense Refill Last Dose  . furosemide (LASIX) 40 MG tablet Take 40 mg by mouth daily as needed for fluid or edema.   Past Week at Unknown time  . naproxen (NAPROSYN) 500 MG tablet Take 1 tablet (500 mg total) by mouth 2 (two) times daily between meals as needed for moderate pain. 20 tablet 0 PRN at PRN  . diltiazem (CARDIZEM CD) 180 MG 24 hr capsule Take 1 capsule (180 mg total) by mouth daily. (Patient not taking: Reported on 09/30/2016) 30 capsule 6 Not Taking at Unknown time  . predniSONE (DELTASONE) 10 MG tablet Take 40mg  daily for 3days,Take 30mg  daily for 3days,Take 20mg  daily for 3days,Take 10mg  daily  for 3days, then stop (Patient not taking: Reported on 09/30/2016) 30 tablet 0 Not Taking at Unknown time  . rivaroxaban (XARELTO) 20 MG TABS tablet Take 1 tablet (20 mg total) by mouth daily with supper. 30 tablet 3 ON HOLD at ON HOLD    Assessment: 66 y.o. male admitted with CAP/pleural effusion s/p thoracentesis 4/5. History of AFlutter on Xarelto - currentlyon hold and Pharmacy dosing dosing heparin. Noted pt has not taken his Xarelto for > 1 week.  Initial heparin level low 0.19 on 1600 units/h. No bleeding or IV line issues per RN and drip has not been turned off today.  Goal of Therapy:  Heparin level 0.3-0.7 units/ml Monitor platelets by anticoagulation protocol: Yes   Plan:  Increase heparin to 1850 units/h Check heparin level in 6 hours Daily heparin level/CBC Monitor for s/sx bleeding   Elicia Lamp, PharmD, BCPS Clinical Pharmacist 10/01/2016 4:17 PM

## 2016-10-01 NOTE — Significant Event (Addendum)
Rapid Response Event Note  Overview:  Called to assist with patient with elevated HR Time Called: 1937 Arrival Time: 0701 Event Type: Cardiac  Initial Focused Assessment:  On arrival MD's at bedside along with RN Malachy Mood.  Treatment per MD's.  Patient alert sitting on side of bed - good color - warm and dry although staff report some earlier diaphoresis - oriented - speaking full sentences - patient hyperexcited - describes discomfort when lying down mostly - resps rapid 24 - denies increased pain with deep breath - bil BS present - decreased on left few rhonci.  O2 sats 98% on 2 liters.  BP  90/52 after second dose of Lopressor 5 mg IV and one NTG SL given per MD orders.  12 lead EKG shows aflutter. Heart tones clear.     Interventions:  Above interventions per MD and 6E staff. Patient alert - very talkative - full sentences without SOB - now warm and dry - mild discomfort - HR remains variable - 119-150.  BP now 104/65.  PCXR and labs done.  MD's to call cardiology consult.  Handoff to [employer] - to call as needed.    Plan of Care (if not transferred): Monitor and transfer to SDU.    Event Summary: Name of Physician Notified: Dr. Shawna Orleans - at bedside on arrival RRT at  (pta RRT )    at    Outcome: Transferred (Comment)  Event End Time: 0730  Quin Hoop

## 2016-10-01 NOTE — Progress Notes (Signed)
Patient heart rate sustained in 160,BP 129/92,DR Elsa notified orders given ,Lopressor 5 mg IV given x 2,heart rate came down to 140,for about 10 min then went back up to 150.pt complained of chest pain and chest tightness in sternum area and broke out into a sweat.Dr Bayard Males notified and rapid response called to come see patient.See physicians orders, Report called and pt  transferred to 3W21 heart rate 158-160 BP 93/79,via stretcher with charge nurse by side.

## 2016-10-01 NOTE — Progress Notes (Signed)
ANTICOAGULATION CONSULT NOTE - Follow Up Consult  Pharmacy Consult for heparin Indication: Aflutter  Labs:  Recent Labs  09/30/16 1222  09/30/16 2351 10/01/16 0554 10/01/16 0711 10/01/16 1412 10/01/16 2226  HGB 12.9*  --   --  12.3*  --   --   --   HCT 40.1  --   --  38.4*  --   --   --   PLT 423*  --   --  383  --   --   --   LABPROT 14.6  --   --   --   --   --   --   INR 1.13  --   --   --   --   --   --   HEPARINUNFRC  --   --   --   --   --  0.19* 0.43  CREATININE 0.71  --   --  0.55*  --   --   --   TROPONINI  --   < > <0.03 <0.03 <0.03  --   --   < > = values in this interval not displayed.   Assessment/Plan:  66yo male therapeutic on heparin after rate change. Will continue gtt at current rate and confirm stable with am labs.   Wynona Neat, PharmD, BCPS  10/01/2016,11:55 PM

## 2016-10-02 ENCOUNTER — Inpatient Hospital Stay (HOSPITAL_COMMUNITY): Payer: PPO

## 2016-10-02 ENCOUNTER — Encounter (HOSPITAL_COMMUNITY)
Admission: EM | Disposition: A | Discharge status: Home / Self Care | Payer: Self-pay | Source: Home / Self Care | Attending: Family Medicine

## 2016-10-02 LAB — COMPREHENSIVE METABOLIC PANEL
ALBUMIN: 2.3 g/dL — AB (ref 3.5–5.0)
ALK PHOS: 67 U/L (ref 38–126)
ALT: 47 U/L (ref 17–63)
ANION GAP: 7 (ref 5–15)
AST: 27 U/L (ref 15–41)
BILIRUBIN TOTAL: 0.5 mg/dL (ref 0.3–1.2)
BUN: 13 mg/dL (ref 6–20)
CALCIUM: 8 mg/dL — AB (ref 8.9–10.3)
CO2: 25 mmol/L (ref 22–32)
Chloride: 107 mmol/L (ref 101–111)
Creatinine, Ser: 0.56 mg/dL — ABNORMAL LOW (ref 0.61–1.24)
GFR calc Af Amer: >60 mL/min (ref >60)
GFR calc non Af Amer: >60 mL/min (ref >60)
GLUCOSE: 105 mg/dL — AB (ref 65–99)
POTASSIUM: 4.4 mmol/L (ref 3.5–5.1)
SODIUM: 139 mmol/L (ref 135–145)
TOTAL PROTEIN: 6.3 g/dL — AB (ref 6.5–8.1)

## 2016-10-02 LAB — CBC
HCT: 39.4 % (ref 39.0–52.0)
Hemoglobin: 12.7 g/dL — ABNORMAL LOW (ref 13.0–17.0)
MCH: 29.1 pg (ref 26.0–34.0)
MCHC: 32.2 g/dL (ref 30.0–36.0)
MCV: 90.4 fL (ref 78.0–100.0)
PLATELETS: 415 10*3/uL — AB (ref 150–400)
RBC: 4.36 MIL/uL (ref 4.22–5.81)
RDW: 13.2 % (ref 11.5–15.5)
WBC: 21.6 10*3/uL — ABNORMAL HIGH (ref 4.0–10.5)

## 2016-10-02 LAB — HEPARIN LEVEL (UNFRACTIONATED): Heparin Unfractionated: 0.25 IU/mL — ABNORMAL LOW (ref 0.30–0.70)

## 2016-10-02 LAB — PROTIME-INR
INR: 1.21
PROTHROMBIN TIME: 15.4 s — AB (ref 11.4–15.2)

## 2016-10-02 LAB — PH, BODY FLUID: pH, Body Fluid: 7.4

## 2016-10-02 LAB — LACTATE DEHYDROGENASE: LDH: 193 U/L — ABNORMAL HIGH (ref 98–192)

## 2016-10-02 SURGERY — ECHOCARDIOGRAM, TRANSESOPHAGEAL
Anesthesia: Monitor Anesthesia Care

## 2016-10-02 MED ORDER — LEVOFLOXACIN 750 MG PO TABS: 750.0000 mg | ORAL_TABLET | Freq: Every day | ORAL | 0 refills | Status: AC

## 2016-10-02 MED ORDER — RIVAROXABAN 20 MG PO TABS
20.0000 mg | ORAL_TABLET | Freq: Every day | ORAL | Status: DC
  Administered 2016-10-02: 20 mg via ORAL
  Filled 2016-10-02: qty 1

## 2016-10-02 MED ORDER — AMIODARONE HCL 200 MG PO TABS: 200.0000 mg | ORAL_TABLET | Freq: Every day | ORAL | 0 refills | Status: DC

## 2016-10-02 MED ORDER — AMIODARONE HCL 200 MG PO TABS
200.0000 mg | ORAL_TABLET | Freq: Every day | ORAL | Status: DC
  Administered 2016-10-02: 200 mg via ORAL
  Filled 2016-10-02: qty 1

## 2016-10-02 MED ORDER — LEVOFLOXACIN 750 MG PO TABS
750.0000 mg | ORAL_TABLET | Freq: Every day | ORAL | Status: DC
  Administered 2016-10-02: 750 mg via ORAL
  Filled 2016-10-02: qty 1

## 2016-10-02 NOTE — Progress Notes (Signed)
ANTICOAGULATION CONSULT NOTE - Follow Up Consult  Pharmacy Consult for heparin Indication: Aflutter  Labs:  Recent Labs  09/30/16 1222  09/30/16 2351 10/01/16 0554 10/01/16 0711 10/01/16 1412 10/01/16 2226 10/02/16 0433  HGB 12.9*  --   --  12.3*  --   --   --  12.7*  HCT 40.1  --   --  38.4*  --   --   --  39.4  PLT 423*  --   --  383  --   --   --  415*  LABPROT 14.6  --   --   --   --   --   --   --   INR 1.13  --   --   --   --   --   --   --   HEPARINUNFRC  --   --   --   --   --  0.19* 0.43 0.25*  CREATININE 0.71  --   --  0.55*  --   --   --   --   TROPONINI  --   < > <0.03 <0.03 <0.03  --   --   --   < > = values in this interval not displayed.   Assessment: 66yo male now subtherapeutic on heparin after one level at goal; RN reports no gtt issues.  Goal of Therapy:  Heparin level 0.3-0.7 units/ml   Plan:  Will increase heparin gtt by 1-2 units/kg/hr to 2000 units/hr and check level in [place], PharmD, BCPS  10/02/2016,6:05 AM

## 2016-10-02 NOTE — Discharge Summary (Signed)
[facility] Discharge Summary  Patient name: Brian [name] Medical record number: [id number] Date of birth: 01/14/1951 Age: 66 y.o. Gender: male Date of Admission: 09/30/2016  Date of Discharge: 10/02/16 Admitting Physician: Leeanne Rio, MD  Primary Care Provider: No PCP Per Patient Consultants: Cardiology  Indication for Hospitalization: Shortness of breath likely 2/2 CAP with simple parapneumonic pleural effusion, atrial flutter    Discharge Diagnoses/Problem List:  CAP, improving Left simple parapneumonic effusion, stable Atrial flutter, resolved HTN, stable HFpEF Tobacco abuse  Opioid use  Hx of Cocaine Abuse   Disposition: Home   Discharge Condition: Stable   Discharge Exam:  General: Well appearing, lying on side on bed, in NAD. Cardiovascular: Normal rate, normal rhythm. No murmurs, rubs or gallops appreciated. Radial and dorsalis pulses 2+. 1+ pitting edema from ankles to mid shin Respiratory: No wheezing anteriorly, posteriorly he had good air movement in the upper lobes bilaterally and decreased breath sounds in the lower lobes bilaterally Neuro: Awake, alert, no gross focal neuro deficits   Brief Hospital Course:  Brian [name] is a 66 y.o. male presenting with shortness of breath . PMH is significant for diastolic CHF, atrial flutter, HTN, CVA, cocaine use and obesity.   Community Acquired Pneumonia (failed outpatient treatment) and Left simple parapneumonic effusion:  On admission, endorsed shortness of breath and chest tightness. He was afebrile but tachycardic, tachypneic with respiratory rate in the low 20's but no oxygen requirement. CXR showed left lower lobe process and left pleural effusion. Labs were notable for a BNP of 153.5 and lactic acid 2.21. EKG showing sinus tachycardia with PACs. He was given IV ceftriaxone and azithromycin in the ED but then transitioned to IV Levaquin. Troponins trended and all negative. CTA  was performed to rule out PE as he was off his Xarelto for 9 days, it was negative for PE but did show left pleural effusion as well as a small pericardial effusion. Left thoracentesis performed and got 1.3L out. Body fluid labs sent. Given Duonebs every 4 hours, these were changed to Ipratropium PRN after his episode of atrial flutter and tachycardia. On the day of discharge, his shortness of breath and chest pain were improved and he was discharged home on Levaquin 750 mg PO daily x 7 days. Blood cultures and pleural gram stain and cultures were negative at the time of discharge.    Pericardial effusion:  A small pericardial effusion was seen on CTA. Cardiology was consulted and they did notthink his pericardial effusion is of clinical significance. Possibly reactive/inflammatory from pneumonia. Cardiology recommended outpatient echo.  Atrialflutter:   History of atrial flutter. When he arrived in the ED his EKG showed sinus tachycardia with PACs. Shortly after admission, EKG showed atrial flutter with 2:1 conduction. He was given 2 doses of Lopressor for rate control.Cardiology was consulted and he was started on IV heparin and IV amiodarone. On day 2 of his admission, he was scheduled for a TEE cardioversion. However, on the morning of day 2, his EKG showed sinus rhythm with PACs so it was cancelled. He was transitioned to Xarelto prior to discharge. He was discharged on PO amiodarone 200 mg daily x 1 month and was advised to be seen in clinic for outpatient follow up.  HTN:  He was mildly hypertensive in the ED but his blood pressure remained well managed throughout his stay. He was discharged home on Lasix 40 mg daily.   HFpEF :  History of diastolic CHF.  BNP in the ED was 153.5. Per chart review he was supposed to be taking Diltiazem 180 mg daily but was not taking his medication. He endorses orthopnea at home and has to sleep at a 45 degrees angle or when he lies on his side. Cardiology  recommended outpatient echo. He was discharged on Lasix 40 mg daily and no diltiazem.   Illicit substance Use: On exam in the ED he appeared slightly manic. His urine drug screen on admission was positive for opioids. Patient previously denied use of any illicit drugs and per national database had no prescription on file for opiates.   Issues for Follow Up:  1. CAP- Ensure patient completes 7 day course of Levaquin. Ensure patient has normal respiratory status  2. Left simple parapneumonic effusion- Pleural fluid labs showing exudative effusion. Body fluid culture NG x 3 days. If still short of breath, get repeat CXR and may need possible repeat thoracentesis  3. Atrial flutter- Follow up on compliance with taking Amiodarone and Xarelto. Will need outpatient echo and follow up with cardiology 4. Tobacco abuse- Smoking cessation counseling 5. HFpEF and pericardial effusion- Cardiology outpatient follow up  Significant Procedures: IR thoracentesis   Significant Labs and Imaging:  No results found for this or any previous visit (from the past 72 hour(s)). Dg Chest 2 View  Result Date: 10/02/2016 CLINICAL DATA:  Dyspnea EXAM: CHEST  2 VIEW COMPARISON:  10/01/2016 FINDINGS: Mild moderate left pleural effusion unchanged. Left lower lobe airspace disease also unchanged. Minimal airspace disease in the right lung with mild right pleural effusion. Negative for heart failure or edema. IMPRESSION: Left pleural effusion and left lower lobe airspace disease unchanged. Minimal right lower lobe airspace disease and small right effusion also unchanged. Electronically Signed   By: Franchot Gallo M.D.   On: 10/02/2016 09:21   Results/Tests Pending at Time of Discharge: Pleural fluid acid fast culture, pleural fluid gram stain, pleural fluid cytology, blood cultures  Discharge Medications:  Allergies as of 10/02/2016      Reactions   Tape Rash   Paper tape is preferred, please!!      Medication List    STOP  taking these medications   diltiazem 180 MG 24 hr capsule Commonly known as:  CARDIZEM CD   predniSONE 10 MG tablet Commonly known as:  DELTASONE     TAKE these medications   amiodarone 200 MG tablet Commonly known as:  PACERONE Take 1 tablet (200 mg total) by mouth daily.   furosemide 40 MG tablet Commonly known as:  LASIX Take 40 mg by mouth daily as needed for fluid or edema.   levofloxacin 750 MG tablet Commonly known as:  LEVAQUIN Take 1 tablet (750 mg total) by mouth daily.   naproxen 500 MG tablet Commonly known as:  NAPROSYN Take 1 tablet (500 mg total) by mouth 2 (two) times daily between meals as needed for moderate pain.   rivaroxaban 20 MG Tabs tablet Commonly known as:  XARELTO Take 1 tablet (20 mg total) by mouth daily with supper.       Discharge Instructions: Please refer to Patient Instructions section of EMR for full details.  Patient was counseled important signs and symptoms that should prompt return to medical care, changes in medications, dietary instructions, activity restrictions, and follow up appointments.   Follow-Up Appointments: Follow-up Information    Candee Furbish, MD Follow up.   Specialty:  Cardiology Why:  Call to schedule an appointment if you do not hear from  them within 1 week Contact information: [address] [address] [phone]        [facility] Follow up.   Specialty:  Family Medicine Why:  I think this is the PCP office you mentioned you wanted to see. Please call them to schedule a hospital follow up appointment.  Contact information: [place] [address] [phone]           Mickey Esguerra M Wrangler Penning, MD 10/05/2016, 5:03 PM

## 2016-10-02 NOTE — Progress Notes (Signed)
Progress Note  Patient Name: Brian [name] Date of Encounter: 10/02/2016  Primary Cardiologist: Dr Marlou Porch (new)  Subjective   He feels much less SOB.  Inpatient Medications    Scheduled Meds: . famotidine  20 mg Oral Daily  . levofloxacin (LEVAQUIN) IV  750 mg Intravenous Q24H  . sodium chloride flush  3 mL Intravenous Q12H  . sodium chloride flush  3 mL Intravenous Q12H   Continuous Infusions: . sodium chloride Stopped (10/01/16 2300)  . sodium chloride    . amiodarone 30 mg/hr (10/02/16 0453)  . heparin 2,000 Units/hr (10/02/16 0603)   PRN Meds: sodium chloride, acetaminophen **OR** acetaminophen, furosemide, hydrocortisone cream, hydrOXYzine, ibuprofen, ipratropium, lidocaine (PF), ondansetron **OR** ondansetron (ZOFRAN) IV, polyethylene glycol, sodium chloride flush, sodium chloride flush   Vital Signs    Vitals:   10/02/16 0042 10/02/16 0200 10/02/16 0300 10/02/16 0400  BP:      Pulse:  76 82 82  Resp:      Temp: 98.4 F (36.9 C)     TempSrc: Oral     SpO2:  95% 94% 96%  Weight:      Height:        Intake/Output Summary (Last 24 hours) at 10/02/16 0847 Last data filed at 10/02/16 0453  Gross per 24 hour  Intake          1247.19 ml  Output              750 ml  Net           497.19 ml   Filed Weights   09/30/16 1218 10/01/16 0600  Weight: 238 lb (108 kg) 240 lb (108.9 kg)    Telemetry    NSR - Personally Reviewed  ECG     10/01/16 NSR with PAC- Personally Reviewed  Physical Exam   GEN: Obese. No acute distress.   Neck: No JVD Cardiac: RRR, no murmurs, rubs, or gallops.  Respiratory:Decreased breath sounds overall c/w COPD-no wheezing GI: Obese, Soft, nontender, non-distended  MS: No edema; No deformity. Neuro:  Nonfocal  Psych: Normal affect   Labs    Chemistry Recent Labs Lab 09/30/16 1222 10/01/16 0554  NA 138 138  K 4.3 4.1  CL 104 104  CO2 26 24  GLUCOSE 116* 109*  BUN 20 12  CREATININE 0.71 0.55*  CALCIUM 8.4* 8.1*    GFRNONAA >60 >60  GFRAA >60 >60  ANIONGAP 8 10     Hematology Recent Labs Lab 09/30/16 1222 10/01/16 0554 10/02/16 0433  WBC 22.5* 17.9* 21.6*  RBC 4.40 4.22 4.36  HGB 12.9* 12.3* 12.7*  HCT 40.1 38.4* 39.4  MCV 91.1 91.0 90.4  MCH 29.3 29.1 29.1  MCHC 32.2 32.0 32.2  RDW 13.2 13.5 13.2  PLT 423* 383 415*    Cardiac Enzymes Recent Labs Lab 09/30/16 1901 09/30/16 2351 10/01/16 0554 10/01/16 0711  TROPONINI <0.03 <0.03 <0.03 <0.03    Recent Labs Lab 09/30/16 1247  TROPIPOC 0.00     BNP Recent Labs Lab 09/30/16 1222  BNP 153.5*     DDimer No results for input(s): DDIMER in the last 168 hours.   Radiology    Dg Chest 1 View  Result Date: 10/01/2016 CLINICAL DATA:  Status post thoracentesis. EXAM: CHEST 1 VIEW COMPARISON:  10/01/2016 FINDINGS: Left-sided pleural effusion is significantly smaller. There is left basilar opacity obscuring the left hemidiaphragm consistent with atelectasis or residual consolidation. There is no pneumothorax following thoracentesis. Cardiomegaly. No pulmonary edema. IMPRESSION: Significantly  improved left-sided pleural effusion. No pneumothorax. Electronically Signed   By: Nolon Nations M.D.   On: 10/01/2016 15:56   Dg Chest 2 View  Result Date: 09/30/2016 CLINICAL DATA:  Cough and congestion. EXAM: CHEST  2 VIEW COMPARISON:  09/27/2015 FINDINGS: The cardiac silhouette, mediastinal and hilar contours are stable. Left lower lobe process most likely a combination of effusion, atelectasis and/or infiltrate. Right basilar atelectasis is also noted. No findings for pulmonary edema. IMPRESSION: Left lower lobe process likely a combination of infiltrate or atelectasis and left pleural effusion. Electronically Signed   By: Marijo Sanes M.D.   On: 09/30/2016 12:45   Ct Angio Chest Pe W Or Wo Contrast  Result Date: 09/30/2016 CLINICAL DATA:  Diagnosed with pneumonia Monday but has not been taking blood thinners x8 days. Current smoker.  Pleural effusion. Congestive heart failure EXAM: CT ANGIOGRAPHY CHEST WITH CONTRAST TECHNIQUE: Multidetector CT imaging of the chest was performed using the standard protocol during bolus administration of intravenous contrast. Multiplanar CT image reconstructions and MIPs were obtained to evaluate the vascular anatomy. CONTRAST:  100 cc Isovue 370 IV COMPARISON:  09/30/2016 CXR, 09/27/2015 CXR, 04/17/2014 chest CT FINDINGS: Cardiovascular: Normal size cardiac chambers with small circumferential pericardial effusion measuring between 1.6 and 1.8 cm in thickness cm. No aortic aneurysm or dissection. Aortic atherosclerosis at the arch. No large central pulmonary embolus. Mediastinum/Nodes: Mildly enlarged, potentially reactive mediastinal lymph nodes in the subcarinal, pretracheal and prevascular portions of the mediastinum, the largest approximately 13 mm in the prevascular space. Thyroid gland, trachea, and esophagus demonstrate no significant findings. Lungs/Pleura: Moderate left effusion extending to the lung apex with adjacent compressive atelectasis and probable lingular atelectasis or scarring. No dominant mass is identified. No pneumonic consolidation. Upper Abdomen: No acute abnormality. Musculoskeletal: Healed fracture deformity of the upper sternum. Minor degenerative changes along the dorsal spine. No acute nor suspicious osseous abnormality. Review of the MIP images confirms the above findings. IMPRESSION: 1. No acute pulmonary embolus. 2. Small circumferential pericardial effusion measuring up to 1.8 cm thickness. 3. Moderate left effusion with adjacent compressive atelectasis. 4. Degenerative changes along the dorsal spine. Healed fracture deformity of the upper sternum. Electronically Signed   By: Ashley Royalty M.D.   On: 09/30/2016 18:00   Dg Chest Port 1 View  Result Date: 10/01/2016 CLINICAL DATA:  Patient with shortness of breath. EXAM: PORTABLE CHEST 1 VIEW COMPARISON:  Chest CT 09/30/2016; chest  radiograph 09/30/2016 FINDINGS: Monitoring leads overlie the patient. Stable enlarged cardiac and mediastinal contours. Persistent moderate layering left pleural effusion with underlying pulmonary consolidation. Minimal heterogeneous opacities right lung base. No pneumothorax. Osseous skeleton is unremarkable. IMPRESSION: Persistent moderate left pleural effusion with underlying consolidation. Electronically Signed   By: Lovey Newcomer M.D.   On: 10/01/2016 07:49   Ir Thoracentesis Asp Pleural Space W/img Guide  Result Date: 10/01/2016 INDICATION: Patient admitted with pneumonia now with left pleural effusion. Request is made for diagnostic and therapeutic left thoracentesis. EXAM: ULTRASOUND GUIDED DIAGNOSTIC AND THERAPEUTIC LEFT THORACENTESIS MEDICATIONS: 10 mL 1% lidocaine COMPLICATIONS: None immediate. PROCEDURE: An ultrasound guided thoracentesis was thoroughly discussed with the patient and questions answered. The benefits, risks, alternatives and complications were also discussed. The patient understands and wishes to proceed with the procedure. Written consent was obtained. Ultrasound was performed to localize and Lucila Klecka an adequate pocket of fluid in the left chest. The area was then prepped and draped in the normal sterile fashion. 1% Lidocaine was used for local anesthesia. Under ultrasound guidance a  Safe-T-centesis catheter was introduced. Thoracentesis was performed. The catheter was removed and a dressing applied. FINDINGS: A total of approximately 1.3 liters of blood-tinged fluid was removed. Samples were sent to the laboratory as requested by the clinical team. IMPRESSION: Successful ultrasound guided diagnostic and therapeutic left thoracentesis yielding 1.3 liters of pleural fluid. Procedure was stopped prior to removal of all fluid due to patient discomfort. Read by:  Brynda Greathouse PA-C Electronically Signed   By: Lucrezia Europe M.D.   On: 10/01/2016 15:41    Cardiac Studies   None  Patient  Profile     66 y.o. male admitted with AF (flutter with RVR) and COPD exacerbation with pleural effusion. He converted with Amiodarone.   Assessment & Plan    Atrial flutter with RVR - Pt converted on IV Amiodarone to NSR- will cancel TEE CV.  -Chronic diastolic heart failure  - May be slightly fluid overloaded on exam however borderline low B/P- not on Lasix. Continue to monitor.  Anticoagulation  - Currently using IV heparin. Transition to Xarelto.  - Pleural effusion- Lt pleural effusion tapped 1.3L. The pt had held his Xarelto prior to admission because he thought he may need a thoracentesis, though he has never had one previously.   Pericardial effusion  - Small. Should be of no clinical significance. Possibly reactive/inflammatory from pneumonia.  Plan: Will discuss with Dr Marlou Porch- consider discharge on PO Amiodarone for ? 6 weeks. The pt could have an echo as an OP, possible discharge today. Resume Lasix 40 mg daily.  Early office follow up.    Signed, Kerin Ransom, PA-C  10/02/2016, 8:47 AM    Personally seen and examined. Agree with above.  Doing better RRR, CTAB Converted on amio IV. No TEE needed. Now NSR Will give AMIO 200 QD for one month to maintain rhythm.   ECHO: - Left ventricle: The cavity size was normal. Systolic function was   normal. The estimated ejection fraction was in the range of 55%   to 60%. Wall motion was normal; there were no regional wall   motion abnormalities. Left ventricular diastolic function   parameters were normal. - Aortic valve: There was trivial regurgitation. Valve area (VTI):   1.79 cm^2. Valve area (Vmax): 2.01 cm^2. Valve area (Vmean): 2   cm^2. - Atrial septum: There was increased thickness of the septum,   consistent with lipomatous hypertrophy.  Set him up with follow-up in clinic.  OK for DC.   Candee Furbish, MD

## 2016-10-02 NOTE — Progress Notes (Addendum)
Notified by [employer] that patient had converted to SR.  Rate in the 80s.  Monitoring continued. EKG obtained to confirm.

## 2016-10-02 NOTE — Discharge Instructions (Addendum)
You were admitted to the hospital for pneumonia with some fluid on your lung which caused you to feel short of breath. We took some fluid off your lung and gave you IV antibiotics. Additionally, you have an episode of atrial flutter (irregular heart beat) which cardiology saw you for. Your heart rate went back to normal with medicine, cardiology wanted to continue you on this medication (amiodarone) to prevent this from happening in the future. They would like to follow up with you in the outpatient setting. Please see the above section with who to follow up with in the outpatient setting.   You will need to continue taking the Levaquin antibiotic for 4 more days.   Take Naproxen or Tylenol for pain where they drained your lung.   Please come back to the hospital if your breathing gets worse, you have chest pain, lightheadedness, pass out, or you continue to have fever even after antibiotics.     Community-Acquired Pneumonia, Adult Pneumonia is an infection of the lungs. One type of pneumonia can happen while a person is in a hospital. A different type can happen when a person is not in a hospital (community-acquired pneumonia). It is easy for this kind to spread from person to person. It can spread to you if you breathe near an infected person who coughs or sneezes. Some symptoms include:  A dry cough.  A wet (productive) cough.  Fever.  Sweating.  Chest pain. Follow these instructions at home:  Take over-the-counter and prescription medicines only as told by your doctor.  Only take cough medicine if you are losing sleep.  If you were prescribed an antibiotic medicine, take it as told by your doctor. Do not stop taking the antibiotic even if you start to feel better.  Sleep with your head and neck raised (elevated). You can do this by putting a few pillows under your head, or you can sleep in a recliner.  Do not use tobacco products. These include cigarettes, chewing tobacco, and  e-cigarettes. If you need help quitting, ask your doctor.  Drink enough water to keep your pee (urine) clear or pale yellow. A shot (vaccine) can help prevent pneumonia. Shots are often suggested for:  People older than 65 years of age.  People older than 66 years of age:  Who are having cancer treatment.  Who have long-term (chronic) lung disease.  Who have problems with their body's defense system (immune system). You may also prevent pneumonia if you take these actions:  Get the flu (influenza) shot every year.  Go to the dentist as often as told.  Wash your hands often. If soap and water are not available, use hand sanitizer. Contact a doctor if:  You have a fever.  You lose sleep because your cough medicine does not help. Get help right away if:  You are short of breath and it gets worse.  You have more chest pain.  Your sickness gets worse. This is very serious if:  You are an older adult.  Your body's defense system is weak.  You cough up blood. This information is not intended to replace advice given to you by your health care provider. Make sure you discuss any questions you have with your health care provider. Document Released: 12/02/2007 Document Revised: 11/21/2015 Document Reviewed: 10/10/2014 Elsevier Interactive Patient Education  2017 [employer].

## 2016-10-02 NOTE — Progress Notes (Signed)
Family Medicine Teaching Service Daily Progress Note Intern Pager: [phone]  Patient name: CAROLE DEERE Medical record number: [id number] Date of birth: 1950-11-02 Age: 66 y.o. Gender: male  Primary Care Provider: No PCP Per Patient Consultants: Cardiology Code Status: Full, however patient only wants to be intubated for a temporary amount of time if necessary   Pt Overview and Major Events to Date:  4/4- Admit to FPTS. Started on Levoquin 750 mg IV daily and duonebs. CXR was ordered that showed pleural effusion. CTA showed small pericardial effusion so echo and IR thoracocentesis were ordered. 4/5- EKG showed aflutter and pt was tachy to the 160s. Cards c/s and started on IV heparin and IV amiodarone. TEE cardioversion scheduled for tomorrow. IR thoracentesis today.  4/6- Patient tolerated thoracentesis well. 1.3 L of blood tinged fluid was taken off during thoracentesis. EKG showed sinus rhythm. Patient no longer tachycardic and his respiratory rate is improved on room air.   Assessment and Plan: SANDRA TELLEFSEN is a 66 y.o. male presenting with shortness of breath . PMH is significant for diastolic CHF, atrial flutter, HTN, CVA, cocaine use and obesity.   Shortness of Breath likely from CAP and left simple parapneumonic exudative effusion: Physical exam today notable for improved air movement in the posterior upper lobes bilaterally but decreased breath sounds in the lower lobes posteriorly, no crackles. 1+ pitting edema from ankles to mid shin. Afebrile. WBC was 21.6, increased from 17.9, however leucocytosis most likely due to thoracentesis yesterday. Specimens from the thoracentesis show an elevated wbc count with a lymphocyte predominance. However, no organisms seen to date. Total protein was <3 and glucose was 113. Albumin was 1.3, LDH was elevated to 439 and pH was 7.4. By Lights criteria he has an exudative effusion. Repeat CXR this morning showing unchanged size of pleural effusion.  Per labs and clinical picture does not appear to be complicated or indicative of empyema. Respiratory status is improved after thoracentesis yesterday.  - Discharge home today - Vitals per floor protocol - Discontinue Levaquin 750 mg IV daily (Today is day 3) and transition to oral Levaquin 750 mg  - Continue Ipratropium q6h PRN for wheezing and SOB - Follow up on blood culture drawn in ED - Follow up on IR thoracocentesis culture although no organisms seen on gram stain - Monitor respiratory status, continuous pulse ox  Atrial flutter, resolved: History of atrial flutter. EKG overnight showed sinus rhythm.  - Cardiology c/s, appreciate reccs. Cleared by cardiology to be discharged  - Discontinue IV amiodarone and discharge patient on oral amiodarone  - Discontinue IV heparin and transition to home Xarelto today  HTN, stable: History of HTN and currently only taking Lasix. Per his chart review he was on home Lasix 40 mg daily. He was hypertensive in the ED today. Most recent blood pressure was 134/76. - Discharge home on Lasix 40 mg daily   Hx of HFpEF: History of diastolic CHF. BNP in the ED was 153.5. Per chart review he was supposed to be taking Diltiazem 180 mg daily. Is not currently taking his medication. Endorses orthopnea at home and has to sleep at a 45 degrees angle or when he lies on his side. Last echo was April 2017 which showed normal EF and no signs of heart failure, the echo before that in 2015 did show G1DD. - Cardiology c/s, appreciate reccs.  - OP Echo with early cardiology office f/u - Discharge home on Lasix 40 mg daily   Pericardial effusion:  Small, seen on CTA. Cards doesn't think his pericardial effusion is of clinical significance. Possibly reactive/inflammatory from pneumonia.  - Echo and cardiology reccs as above  Tobacco abuse: 50 pack year smoking history. Has tried to quit in the past but because of family issues started again. Used to smoke a pack a day but  now is down to 3-4 cigarettes per day. - Provide counseling for smoking cessation - Offered him a nicotine patch, but he refused. Made him aware that he could request it if he felt as though he needed it.     Elevated Lactic Acid, resolved: Lactic acid was 1.5, down from 2.21 in the ED. Received 500 cc bolus in the ED.   Hx of Cocaine Abuse: Patient denies cocaine abuse however appears slightly manic on exam.  - UDS negative for cocaine, positive for opiates.   Opioid Use: Positive for opioids on UDS. Per [employer] he has no prescription on file for opiates. Did not admit to using on admission.  - Try avoiding opioids while hospitalized   FEN/GI: Heart healthy diet PPx: Heparin   Disposition: Home today. Patient was insistent on scheduling follow up on his own at [facility] in [place], [place].    Subjective:  Patient well appearing and sitting on side in bed. Endorses improved breathing with exertion and when lying down on his back. No pain.  No other complaints   Objective: Temp:  [98.4 F (36.9 C)-98.8 F (37.1 C)] 98.4 F (36.9 C) (04/06 0042) Pulse Rate:  [65-159] 82 (04/06 0400) Resp:  [16] 16 (04/05 0900) BP: (97-134)/(75-89) 134/76 (04/05 1404) SpO2:  [88 %-96 %] 96 % (04/06 0400) Physical Exam: General: Well appearing, lying on side on bed, in NAD. Cardiovascular:Normal rate, normal rhythm. No murmurs, rubs or gallops appreciated. Radial and dorsalis pulses 2+. 1+ pitting edema from ankles to mid shin Respiratory: No wheezing anteriorly, posteriorly he had good air movement in the upper lobes bilaterally and decreased breath sounds in the lower lobes bilaterally Neuro: Awake, alert, no focal neuro deficits grossly   Laboratory:  Results for orders placed or performed during the hospital encounter of 09/30/16 (from the past 24 hour(s))  Magnesium     Status: None   Collection Time: 10/01/16  9:58 AM  Result Value Ref Range   Magnesium 2.0 1.7 - 2.4  mg/dL  Phosphorus     Status: None   Collection Time: 10/01/16  9:58 AM  Result Value Ref Range   Phosphorus 2.5 2.5 - 4.6 mg/dL  Lactic acid, plasma     Status: None   Collection Time: 10/01/16  9:58 AM  Result Value Ref Range   Lactic Acid, Venous 1.5 0.5 - 1.9 mmol/L  Heparin level (unfractionated)     Status: Abnormal   Collection Time: 10/01/16  2:12 PM  Result Value Ref Range   Heparin Unfractionated 0.19 (L) 0.30 - 0.70 IU/mL  Culture, body fluid-bottle     Status: None (Preliminary result)   Collection Time: 10/01/16  3:44 PM  Result Value Ref Range   Specimen Description FLUID PLEURAL LEFT    Special Requests BOTTLES DRAWN AEROBIC AND ANAEROBIC 10CC    Culture PENDING    Report Status PENDING   Gram stain     Status: None   Collection Time: 10/01/16  3:44 PM  Result Value Ref Range   Specimen Description FLUID PLEURAL LEFT    Special Requests NONE    Gram Stain  WBC PRESENT,BOTH PMN AND MONONUCLEAR NO ORGANISMS SEEN CYTOSPIN SMEAR    Report Status 10/01/2016 FINAL   Lactate dehydrogenase (CSF, pleural or peritoneal fluid)     Status: Abnormal   Collection Time: 10/01/16  3:52 PM  Result Value Ref Range   LD, Fluid 439 (H) 3 - 23 U/L   Fluid Type-FLDH PLEURAL   Body fluid cell count with differential     Status: Abnormal   Collection Time: 10/01/16  3:52 PM  Result Value Ref Range   Fluid Type-FCT PLEURAL    Color, Fluid YELLOW (A) YELLOW   Appearance, Fluid CLOUDY (A) CLEAR   WBC, Fluid 2,526 (H) 0 - 1,000 cu mm   Neutrophil Count, Fluid 39 (H) 0 - 25 %   Lymphs, Fluid 46 %   Monocyte-Macrophage-Serous Fluid 8 (L) 50 - 90 %   Eos, Fluid 7 %  Protein, pleural or peritoneal fluid     Status: None   Collection Time: 10/01/16  3:52 PM  Result Value Ref Range   Total protein, fluid <3.0 g/dL   Fluid Type-FTP PLEURAL   Glucose, plural or peritoneal fluid     Status: None   Collection Time: 10/01/16  3:52 PM  Result Value Ref Range   Glucose, Fluid 113  mg/dL   Fluid Type-FGLU PLEURAL   Albumin, pleural or peritoneal fluid     Status: None   Collection Time: 10/01/16  3:55 PM  Result Value Ref Range   Albumin, Fluid 1.3 g/dL   Fluid Type-FALB PLEURAL   Heparin level (unfractionated)     Status: None   Collection Time: 10/01/16 10:26 PM  Result Value Ref Range   Heparin Unfractionated 0.43 0.30 - 0.70 IU/mL  Heparin level (unfractionated)     Status: Abnormal   Collection Time: 10/02/16  4:33 AM  Result Value Ref Range   Heparin Unfractionated 0.25 (L) 0.30 - 0.70 IU/mL  CBC     Status: Abnormal   Collection Time: 10/02/16  4:33 AM  Result Value Ref Range   WBC 21.6 (H) 4.0 - 10.5 K/uL   RBC 4.36 4.22 - 5.81 MIL/uL   Hemoglobin 12.7 (L) 13.0 - 17.0 g/dL   HCT 39.4 39.0 - 52.0 %   MCV 90.4 78.0 - 100.0 fL   MCH 29.1 26.0 - 34.0 pg   MCHC 32.2 30.0 - 36.0 g/dL   RDW 13.2 11.5 - 15.5 %   Platelets 415 (H) 150 - 400 K/uL  Protime-INR     Status: Abnormal   Collection Time: 10/02/16  4:33 AM  Result Value Ref Range   Prothrombin Time 15.4 (H) 11.4 - 15.2 seconds   INR 1.21     Imaging/Diagnostic Tests: Dg Chest 1 View  Result Date: 10/01/2016 CLINICAL DATA:  Status post thoracentesis. EXAM: CHEST 1 VIEW COMPARISON:  10/01/2016 FINDINGS: Left-sided pleural effusion is significantly smaller. There is left basilar opacity obscuring the left hemidiaphragm consistent with atelectasis or residual consolidation. There is no pneumothorax following thoracentesis. Cardiomegaly. No pulmonary edema. IMPRESSION: Significantly improved left-sided pleural effusion. No pneumothorax. Electronically Signed   By: Nolon Nations M.D.   On: 10/01/2016 15:56   Ir Thoracentesis Asp Pleural Space W/img Guide  Result Date: 10/01/2016 INDICATION: Patient admitted with pneumonia now with left pleural effusion. Request is made for diagnostic and therapeutic left thoracentesis. EXAM: ULTRASOUND GUIDED DIAGNOSTIC AND THERAPEUTIC LEFT THORACENTESIS  MEDICATIONS: 10 mL 1% lidocaine COMPLICATIONS: None immediate. PROCEDURE: An ultrasound guided thoracentesis was thoroughly discussed with the patient  and questions answered. The benefits, risks, alternatives and complications were also discussed. The patient understands and wishes to proceed with the procedure. Written consent was obtained. Ultrasound was performed to localize and mark an adequate pocket of fluid in the left chest. The area was then prepped and draped in the normal sterile fashion. 1% Lidocaine was used for local anesthesia. Under ultrasound guidance a Safe-T-centesis catheter was introduced. Thoracentesis was performed. The catheter was removed and a dressing applied. FINDINGS: A total of approximately 1.3 liters of blood-tinged fluid was removed. Samples were sent to the laboratory as requested by the clinical team. IMPRESSION: Successful ultrasound guided diagnostic and therapeutic left thoracentesis yielding 1.3 liters of pleural fluid. Procedure was stopped prior to removal of all fluid due to patient discomfort. Read by:  Brynda Greathouse PA-C Electronically Signed   By: Lucrezia Europe M.D.   On: 10/01/2016 15:41   Colon Flattery, [facility] [employer] [facility], MS4  I have separately seen and examined the patient. I have discussed the findings and exam with medical student Ashleyand agree with the above note. My changes/additions are outlined in BLUE. Additionally:   Patient is a 66 year old male with history of hypertension, a flutter, tobacco abuse who presented with shortness of breath in the setting of being treated for 3 days in outpatient setting with community-acquired pneumonia. Aflutter resolved with IV amiodarone.   Physical Exam  General: Well appearing, sitting up in bed, in NAD.  Cardiovascular: Regular rate and rhythm. No murmurs, rubs or gallops appreciated. Radial and dorsalis pulses 2+. 1+ pitting edema from ankles to mid shin Respiratory: Normal work  of breathing, no wheezing, posteriorly he had decreased breath sounds L>R but improved from yesterday.  Gastrointestinal: Normoactive bowel sounds. No TTP. No distention or masses. Psych: Pleasant mood, rapid speech  A/P Community acquired pneumonia with left pleural effusion: Improved respiratory status. S/p thoracentesis yesterday showing exudative fluid. No organisms on gram stain.  Patient has a PCP in mind and does not want social work here to help him get a follow up appointment when he leaves the hosptial.  Transition to PO Levaquin for a 7 day course -Discharge today with close follow up  A Flutter: Resolved. Echo normal.  - Cardiology consulted, appreciated their assistance. He will follow up in outpatient setting - PO amiodarone - Xarelto on discharge  Smitty Cords, MD [place], PGY-2

## 2016-10-06 LAB — CULTURE, BODY FLUID W GRAM STAIN -BOTTLE
Culture: NO GROWTH
Culture: NO GROWTH

## 2016-10-06 LAB — CULTURE, BODY FLUID-BOTTLE

## 2016-10-06 LAB — CULTURE, BLOOD (ROUTINE X 2)
CULTURE: NO GROWTH
Culture: NO GROWTH
SPECIAL REQUESTS: ADEQUATE
Special Requests: ADEQUATE

## 2016-10-06 NOTE — Telephone Encounter (Signed)
Pt has est with [facility]

## 2016-10-13 LAB — ACID FAST SMEAR (AFB, MYCOBACTERIA): Acid Fast Smear: NEGATIVE

## 2016-10-13 LAB — ACID FAST SMEAR (AFB)

## 2016-10-14 ENCOUNTER — Telehealth: Payer: Self-pay | Admitting: General Practice

## 2016-10-14 NOTE — Telephone Encounter (Signed)
Pt has his hospital f/u with Dr. Orpah Melter at [facility]

## 2016-10-22 DIAGNOSIS — I5032 Chronic diastolic (congestive) heart failure: Secondary | ICD-10-CM | POA: Diagnosis not present

## 2016-10-22 DIAGNOSIS — I10 Essential (primary) hypertension: Secondary | ICD-10-CM | POA: Diagnosis not present

## 2016-10-22 DIAGNOSIS — Z7901 Long term (current) use of anticoagulants: Secondary | ICD-10-CM | POA: Diagnosis not present

## 2016-10-22 DIAGNOSIS — I4892 Unspecified atrial flutter: Secondary | ICD-10-CM | POA: Diagnosis not present

## 2016-10-22 DIAGNOSIS — J189 Pneumonia, unspecified organism: Secondary | ICD-10-CM | POA: Diagnosis not present

## 2016-10-22 DIAGNOSIS — F419 Anxiety disorder, unspecified: Secondary | ICD-10-CM | POA: Diagnosis not present

## 2016-10-22 DIAGNOSIS — Z6839 Body mass index (BMI) 39.0-39.9, adult: Secondary | ICD-10-CM | POA: Diagnosis not present

## 2016-10-23 ENCOUNTER — Encounter: Payer: Self-pay | Admitting: Cardiology

## 2016-10-23 ENCOUNTER — Encounter (INDEPENDENT_AMBULATORY_CARE_PROVIDER_SITE_OTHER): Payer: Self-pay

## 2016-10-23 ENCOUNTER — Ambulatory Visit (INDEPENDENT_AMBULATORY_CARE_PROVIDER_SITE_OTHER): Payer: PPO | Admitting: Cardiology

## 2016-10-23 VITALS — BP 120/74 | HR 143 | Ht 69.0 in | Wt 242.0 lb

## 2016-10-23 DIAGNOSIS — I5032 Chronic diastolic (congestive) heart failure: Secondary | ICD-10-CM

## 2016-10-23 DIAGNOSIS — I4892 Unspecified atrial flutter: Secondary | ICD-10-CM | POA: Diagnosis not present

## 2016-10-23 MED ORDER — METOPROLOL SUCCINATE ER 25 MG PO TB24: 25.0000 mg | ORAL_TABLET | Freq: Every day | ORAL | 6 refills | Status: DC

## 2016-10-23 MED ORDER — AMIODARONE HCL 200 MG PO TABS: ORAL_TABLET | ORAL | 6 refills | Status: DC

## 2016-10-23 MED ORDER — FUROSEMIDE 40 MG PO TABS: ORAL_TABLET | ORAL | 6 refills | Status: DC

## 2016-10-23 NOTE — Progress Notes (Signed)
Cardiology Office Note:    Date:  10/23/2016   ID:  Brian [name], DOB [birth year], MRN [id number]  PCP:  Orpah Melter, MD  Cardiologist:  Candee Furbish, MD  Referring MD: No ref. provider found     History of Present Illness:    Brian [name] is a 66 y.o. male with History of atrial flutter, diastolic heart failure former Dr. Mare Ferrari patient with anxiety here for follow-up. I saw him in consultation in the hospital on 10/01/2016 when he was in atrial flutter. IV amiodarone was utilized. Less than 24 hours he converted. He was sent home on by mouth amiodarone 200 mg once a day and Xarelto. He states that he has not missed a dose of Xarelto. He states that his lower extremities remain edematous like they were prior to his hospitalization. He is taking twice as much Lasix, 40 mg twice a day.   Currently is in atrial flutter with rapid ventricular response. He has been taking Xarelto 20 mg.   ECHO 09/28/15 - Left ventricle: The cavity size was normal. Systolic function was   normal. The estimated ejection fraction was in the range of 55%   to 60%. Wall motion was normal; there were no regional wall   motion abnormalities. Left ventricular diastolic function   parameters were normal. - Aortic valve: There was trivial regurgitation. Valve area (VTI):   1.79 cm^2. Valve area (Vmax): 2.01 cm^2. Valve area (Vmean): 2   cm^2. - Atrial septum: There was increased thickness of the septum,   consistent with lipomatous hypertrophy.  Past Medical History:  Diagnosis Date  . Anxiety   . Atrial flutter ([place])    a. 08/2012  . CHF (congestive heart failure) ([place])   . Hypertension   . Obesity   . Shortness of breath   . Tobacco abuse     Past Surgical History:  Procedure Laterality Date  . IR THORACENTESIS ASP PLEURAL SPACE W/IMG GUIDE  10/01/2016  . MICROLARYNGOSCOPY  09/02/2007   with excision of right vocal cord mass, Dr. Constance Holster    Current Medications: Current Meds  Medication Sig   . hydrOXYzine (ATARAX/VISTARIL) 25 MG tablet Take 25 mg by mouth daily as needed for anxiety.  . naproxen (NAPROSYN) 500 MG tablet Take 1 tablet (500 mg total) by mouth 2 (two) times daily between meals as needed for moderate pain.  . rivaroxaban (XARELTO) 20 MG TABS tablet Take 1 tablet (20 mg total) by mouth daily with supper.  . [DISCONTINUED] amiodarone (PACERONE) 200 MG tablet Take 1 tablet (200 mg total) by mouth daily.  . [DISCONTINUED] furosemide (LASIX) 40 MG tablet Take 40 mg by mouth daily as needed for fluid or edema.     Allergies:   Tape   Social History   Social History  . Marital status: Divorced    Spouse name: N/A  . Number of children: N/A  . Years of education: N/A   Social History Main Topics  . Smoking status: Current Some Day Smoker    Packs/day: 1.00    Years: 50.00    Types: Cigarettes  . Smokeless tobacco: Never Used     Comment: quit 11/04/12  . Alcohol use Yes     Comment: socially  . Drug use: No  . Sexual activity: Not Currently   Other Topics Concern  . None   Social History Narrative  . None     family history includes Cancer in his father and mother; Diabetes in his sister; Heart  attack in his father; Heart disease in his father. ROS:   Please see the history of present illness.   No orthopnea, no PND, no bleeding.  All other systems reviewed and are negative.   EKGs/Labs/Other Studies Reviewed:    EKG:  EKG is  ordered today.  The ekg ordered today demonstrates Atrial flutter 136 variable AV block. Typical flutter downward P wave axis in 2, 3, and upright in V1. Personally viewed  Recent Labs: 09/30/2016: B Natriuretic Peptide 153.5 10/01/2016: Magnesium 2.0 10/02/2016: ALT 47; BUN 13; Creatinine, Ser 0.56; Hemoglobin 12.7; Platelets 415; Potassium 4.4; Sodium 139   Recent Lipid Panel    Component Value Date/Time   CHOL 135 09/28/2015 0402   TRIG 126 09/28/2015 0402   HDL 34 (L) 09/28/2015 0402   CHOLHDL 4.0 09/28/2015 0402   VLDL  25 09/28/2015 0402   LDLCALC 76 09/28/2015 0402    Physical Exam:    VS:  BP 120/74   Pulse (!) 143   Ht 5\' 9"  (1.753 m)   Wt 242 lb (109.8 kg)   SpO2 98%   BMI 35.74 kg/m     Wt Readings from Last 3 Encounters:  10/23/16 242 lb (109.8 kg)  10/01/16 240 lb (108.9 kg)  09/27/15 210 lb (95.3 kg)     GEN:  Well nourished, well developed in no acute distress HEENT: Normal NECK: No JVD; No carotid bruits LYMPHATICS: No lymphadenopathy CARDIAC: Tachycardic, mostly regular given his atrial flutter, no murmurs, rubs, gallops RESPIRATORY:  Clear to auscultation without rales, wheezing or rhonchi  ABDOMEN: Soft, non-tender, non-distended MUSCULOSKELETAL:  2-3+ bilateral lower extremity, left may be greater than right edema; No deformity  SKIN: Warm and dry NEUROLOGIC:  Alert and oriented x 3 PSYCHIATRIC:  Normal affect, anxious. Pressured. Cried once. Joyful as well.   ASSESSMENT:    1. Atrial flutter with rapid ventricular response ([place])   2. Chronic diastolic heart failure ([facility])    PLAN:    In order of problems listed above:  Typical atrial flutter  - I will increase his amiodarone to 200 mg twice a day.  - We will give him Toprol-XL 25 mg once a day.  - I will refer him to EP for possible ablative therapy. This will rid him of his atrial flutter.  - Concerned that continuous flutter could lead to cardiomyopathy.  - In the hospital, he was on IV amiodarone for less than 24 hours and converted.  - If he does not convert with increased amiodarone and he does not wish to proceed with ablative therapy, it would be nice to have him cardioverted.  Chronic diastolic heart failure  - Lower extremity edema continued since hospitalization. It is not improved. 2-3+, fairly tense lower extremities.  - We will increase his Lasix to 80 mg twice a day. He has been taking 40 mg twice a day.  - We will have him come back in 2 weeks to see Cecilie Kicks, NP. He has seen her in the  past.  He is quite adamant that he does not want to go to the hospital. He is not in florid heart failure currently.   Medication Adjustments/Labs and Tests Ordered: Current medicines are reviewed at length with the patient today.  Concerns regarding medicines are outlined above. Labs and tests ordered and medication changes are outlined in the patient instructions below:  Patient Instructions  Increase Amiodarone 200 mg twice a day    Increase Lasix 40 mg take 2 tablets (  80 mg ) twice a day    Start Metoprolol Succinate 25 mg daily    Schedule appointment with Dr.Camnitz to discuss possible AFlutter Abalation     Your physician recommends that you schedule a follow-up appointment in: 2 weeks with Cecilie Kicks NP     Signed, Candee Furbish, MD  10/23/2016 12:57 PM    [place]

## 2016-10-23 NOTE — Patient Instructions (Signed)
Increase Amiodarone 200 mg twice a day    Increase Lasix 40 mg take 2 tablets ( 80 mg ) twice a day    Start Metoprolol Succinate 25 mg daily    Schedule appointment with Dr.Camnitz to discuss possible AFlutter Abalation     Your physician recommends that you schedule a follow-up appointment in: 2 weeks with Cecilie Kicks NP

## 2016-10-26 ENCOUNTER — Telehealth: Payer: Self-pay | Admitting: Cardiology

## 2016-10-26 MED ORDER — POTASSIUM CHLORIDE CRYS ER 20 MEQ PO TBCR: 20.0000 meq | EXTENDED_RELEASE_TABLET | Freq: Every day | ORAL | 3 refills | Status: DC

## 2016-10-26 NOTE — Telephone Encounter (Signed)
New message    Pt c/o medication issue:  1. Name of Medication: Lasix 40 mg  2. How are you currently taking this medication (dosage and times per day)? 2 tabs twice daily  3. Are you having a reaction (difficulty breathing--STAT)? Joint paint  4. What is your medication issue? Pt is calling asking if he can have a prescription for calcicum. He said since he is on 160 mg of lasix.

## 2016-10-26 NOTE — Telephone Encounter (Signed)
Spoke with patient who reports he has been having "joint pain" since his Furosemide was increased last week.  He is not on a supplement.  Reviewed with Dr Marlou Porch who gave orders for pt to start KCL 20 MEQ daily.  Pt is aware and in agreement.  He is reporting he is still having a little bit of swelling in his right ankle after being on it for 10 hrs or so.  His left leg from the knee down looks like it did when he was seen.  Both legs looked normal this am.  Will forward to Dr Marlou Porch for forward review and any other orders.

## 2016-11-03 ENCOUNTER — Encounter: Payer: Self-pay | Admitting: Cardiology

## 2016-11-03 ENCOUNTER — Ambulatory Visit (INDEPENDENT_AMBULATORY_CARE_PROVIDER_SITE_OTHER): Payer: PPO | Admitting: Cardiology

## 2016-11-03 VITALS — BP 126/70 | HR 80 | Ht 70.0 in | Wt 238.0 lb

## 2016-11-03 DIAGNOSIS — I5032 Chronic diastolic (congestive) heart failure: Secondary | ICD-10-CM

## 2016-11-03 DIAGNOSIS — Z01812 Encounter for preprocedural laboratory examination: Secondary | ICD-10-CM | POA: Diagnosis not present

## 2016-11-03 DIAGNOSIS — I4892 Unspecified atrial flutter: Secondary | ICD-10-CM | POA: Diagnosis not present

## 2016-11-03 DIAGNOSIS — Z720 Tobacco use: Secondary | ICD-10-CM

## 2016-11-03 NOTE — Patient Instructions (Addendum)
Medication Instructions:  Your physician recommends that you continue on your current medications as directed. Please refer to the Current Medication list given to you today.  Labwork: Your physician recommends that you have lab work today BMET, CBC, TSH, and PT/INR  Testing/Procedures: Your physician has recommended that you have a Cardioversion (DCCV). Electrical Cardioversion uses a jolt of electricity to your heart either through paddles or wired patches attached to your chest. This is a controlled, usually prescheduled, procedure. Defibrillation is done under light anesthesia in the hospital, and you usually go home the day of the procedure. This is done to get your heart back into a normal rhythm. You are not awake for the procedure. Please see the instruction sheet given to you today.  Follow-Up: Your physician wants you to follow-up in: 2 weeks with Dr. Marlou Porch or Cecilie Kicks NP   If you need a refill on your cardiac medications before your next appointment, please call your pharmacy.

## 2016-11-03 NOTE — Progress Notes (Signed)
Cardiology Office Note   Date:  11/03/2016   ID:  Brian [name], DOB 20-Jan-1951, MRN [id number]  PCP:  Orpah Melter, MD  Cardiologist:  Dr. Marlou Porch    Chief Complaint  Patient presents with  . Atrial Flutter      History of Present Illness: Brian [name] is a 66 y.o. male who presents for a flutter with RVR last seen by Dr. Marlou Porch 10/23/16.  His amiodarone was increased to 200 mg BID, toprol XL 25 mg daily.  Plan if pt has not converdted then would arrange DCCV.   + chronic diastolic HF  With lower ext edema on last visit.  Lasix increased to 80 mg BID.  He did not wish to go to the hospital.   Pt was to see EP for ablation but he did not wish to have to wait for all of that.  He would prefer DCCV.  He feels better with respiration on higher dose of Lasix and edema is better but wight did not go down that much.  He has not missed any Xarelto - he has been taking every day with meal.  No chest pain. An no bleeding issues. He does not wish to be out of work for any length of time currently.     Past Medical History:  Diagnosis Date  . Anxiety   . Atrial flutter ([place])    a. 08/2012  . CHF (congestive heart failure) ([place])   . Hypertension   . Obesity   . Shortness of breath   . Tobacco abuse     Past Surgical History:  Procedure Laterality Date  . IR THORACENTESIS ASP PLEURAL SPACE W/IMG GUIDE  10/01/2016  . MICROLARYNGOSCOPY  09/02/2007   with excision of right vocal cord mass, Dr. Constance Holster     Current Outpatient Prescriptions  Medication Sig Dispense Refill  . amiodarone (PACERONE) 200 MG tablet Take 200 mg twice a day 60 tablet 6  . furosemide (LASIX) 40 MG tablet Take 2 tablets ( 80 mg ) twice a day 120 tablet 6  . hydrOXYzine (ATARAX/VISTARIL) 25 MG tablet Take 25 mg by mouth daily as needed for anxiety.    . metoprolol succinate (TOPROL XL) 25 MG 24 hr tablet Take 1 tablet (25 mg total) by mouth daily. 30 tablet 6  . potassium chloride SA (K-DUR,KLOR-CON) 20 MEQ  tablet Take 1 tablet (20 mEq total) by mouth daily. 90 tablet 3  . rivaroxaban (XARELTO) 20 MG TABS tablet Take 1 tablet (20 mg total) by mouth daily with supper. 30 tablet 3   No current facility-administered medications for this visit.     Allergies:   Tape    Social History:  The patient  reports that he has been smoking Cigarettes.  He has a 50.00 pack-year smoking history. He has never used smokeless tobacco. He reports that he drinks alcohol. He reports that he does not use drugs.    Family History:  The patient's family history includes Cancer in his father and mother; Diabetes in his sister; Heart attack in his father; Heart disease in his father.    ROS:  General:no colds or fevers, mild weight decrease with lasix Skin:no rashes or ulcers HEENT:no blurred vision, no congestion CV:see HPI PUL:see HPI GI:no diarrhea constipation or melena, no indigestion GU:no hematuria, no dysuria MS:no joint pain, no claudication Neuro:no syncope, no lightheadedness Endo:no diabetes, no thyroid disease  Wt Readings from Last 3 Encounters:  11/03/16 238 lb (108 kg)  10/23/16  242 lb (109.8 kg)  10/01/16 240 lb (108.9 kg)     PHYSICAL EXAM: VS:  BP 126/70   Pulse 80   Ht 5\' 10"  (1.778 m)   Wt 238 lb (108 kg)   SpO2 94%   BMI 34.15 kg/m  , BMI Body mass index is 34.15 kg/m. General:Pleasant affect, NAD Skin:Warm and dry, brisk capillary refill HEENT:normocephalic, sclera clear, mucus membranes moist Neck:supple, no JVD, no bruits  Heart:irreg irreg  without murmur, gallup, rub or click Lungs:clear without rales, rhonchi, or wheezes MHD:QQIW, non tender, + BS, do not palpate liver spleen or masses Ext:tr lower ext edema, 2+ pedal pulses, 2+ radial pulses Neuro:alert and oriented X 3, MAE, follows commands, + facial symmetry    EKG:  EKG is ordered today. The ekg ordered today demonstrates A flutter rate of 89 rightward axis.     Recent Labs: 09/30/2016: B Natriuretic  Peptide 153.5 10/01/2016: Magnesium 2.0 10/02/2016: ALT 47; BUN 13; Creatinine, Ser 0.56; Hemoglobin 12.7; Platelets 415; Potassium 4.4; Sodium 139    Lipid Panel    Component Value Date/Time   CHOL 135 09/28/2015 0402   TRIG 126 09/28/2015 0402   HDL 34 (L) 09/28/2015 0402   CHOLHDL 4.0 09/28/2015 0402   VLDL 25 09/28/2015 0402   LDLCALC 76 09/28/2015 0402       Other studies Reviewed: Additional studies/ records that were reviewed today include: . Echo 09/28/15 Study Conclusions  - Left ventricle: The cavity size was normal. Systolic function was   normal. The estimated ejection fraction was in the range of 55%   to 60%. Wall motion was normal; there were no regional wall   motion abnormalities. Left ventricular diastolic function   parameters were normal. - Aortic valve: There was trivial regurgitation. Valve area (VTI):   1.79 cm^2. Valve area (Vmax): 2.01 cm^2. Valve area (Vmean): 2   cm^2. - Atrial septum: There was increased thickness of the septum,   consistent with lipomatous hypertrophy.   ASSESSMENT AND PLAN:  1.  Persistent a flutter now rate controlled and on higher dose of lasix his dyspnea is improved.  On xarelto and has not missed any doses.  Does not wish to spend the time with ablation.  Prefers to have DCCV - will schedule for Thursday.  Pt has had before and understands plan.   2.  Chronic diastolic HF improved with 4 lb wt loss.  Will continue lasix 80 BID for now will check bmp today and may need to decrease lasix.    3. Tobacco use, half a PPD we would like to stop and may stop once back in SR.  May want to try Chantix again.   Current medicines are reviewed with the patient today.  The patient Has no concerns regarding medicines.  The following changes have been made:  See above Labs/ tests ordered today include:see above  Disposition:   FU:  see above  Signed, Cecilie Kicks, NP  11/03/2016 4:36 PM    [facility] [place], [place], [place] Phone: [phone]; Fax: [phone]

## 2016-11-04 ENCOUNTER — Telehealth: Payer: Self-pay | Admitting: Cardiology

## 2016-11-04 DIAGNOSIS — I4892 Unspecified atrial flutter: Secondary | ICD-10-CM

## 2016-11-04 LAB — BASIC METABOLIC PANEL
BUN / CREAT RATIO: 24 (ref 10–24)
BUN: 22 mg/dL (ref 8–27)
CHLORIDE: 101 mmol/L (ref 96–106)
CO2: 25 mmol/L (ref 18–29)
Calcium: 9.1 mg/dL (ref 8.6–10.2)
Creatinine, Ser: 0.93 mg/dL (ref 0.76–1.27)
GFR calc Af Amer: 99 mL/min/{1.73_m2} (ref >59)
GFR calc non Af Amer: 86 mL/min/{1.73_m2} (ref >59)
GLUCOSE: 137 mg/dL — AB (ref 65–99)
Potassium: 5.2 mmol/L (ref 3.5–5.2)
SODIUM: 144 mmol/L (ref 134–144)

## 2016-11-04 LAB — CBC WITH DIFFERENTIAL/PLATELET
BASOS ABS: 0.2 10*3/uL (ref 0.0–0.2)
Basos: 1 %
EOS (ABSOLUTE): 1.1 10*3/uL — ABNORMAL HIGH (ref 0.0–0.4)
Eos: 8 %
Hematocrit: 37.4 % — ABNORMAL LOW (ref 37.5–51.0)
Hemoglobin: 12.1 g/dL — ABNORMAL LOW (ref 13.0–17.7)
Immature Grans (Abs): 0.4 10*3/uL — ABNORMAL HIGH (ref 0.0–0.1)
Immature Granulocytes: 3 %
LYMPHS ABS: 1.8 10*3/uL (ref 0.7–3.1)
Lymphs: 13 %
MCH: 27.3 pg (ref 26.6–33.0)
MCHC: 32.4 g/dL (ref 31.5–35.7)
MCV: 84 fL (ref 79–97)
MONOS ABS: 1.3 10*3/uL — AB (ref 0.1–0.9)
Monocytes: 9 %
Neutrophils Absolute: 9.3 10*3/uL — ABNORMAL HIGH (ref 1.4–7.0)
Neutrophils: 66 %
Platelets: 439 10*3/uL — ABNORMAL HIGH (ref 150–379)
RBC: 4.44 x10E6/uL (ref 4.14–5.80)
RDW: 14 % (ref 12.3–15.4)
WBC: 14.1 10*3/uL — AB (ref 3.4–10.8)

## 2016-11-04 LAB — TSH: TSH: 2.4 u[IU]/mL (ref 0.450–4.500)

## 2016-11-04 LAB — PROTIME-INR
INR: 1.2 (ref 0.8–1.2)
Prothrombin Time: 12.5 s — ABNORMAL HIGH (ref 9.1–12.0)

## 2016-11-04 NOTE — Telephone Encounter (Signed)
-----   Message from Isaiah Serge, NP sent at 11/04/2016  9:52 AM EDT ----- Labs good for DCCV.

## 2016-11-04 NOTE — Telephone Encounter (Signed)
New Message    Pt is returning [place] call for lab results

## 2016-11-04 NOTE — Telephone Encounter (Signed)
Pt has been made aware of his lab results.  

## 2016-11-05 ENCOUNTER — Ambulatory Visit (HOSPITAL_COMMUNITY)
Admission: RE | Admit: 2016-11-05 | Discharge: 2016-11-05 | Disposition: A | Discharge status: Home / Self Care | Payer: PPO | Source: Ambulatory Visit | Attending: Cardiology | Admitting: Cardiology

## 2016-11-05 ENCOUNTER — Ambulatory Visit (HOSPITAL_COMMUNITY): Payer: PPO | Admitting: Certified Registered Nurse Anesthetist

## 2016-11-05 ENCOUNTER — Encounter (HOSPITAL_COMMUNITY): Payer: Self-pay | Admitting: *Deleted

## 2016-11-05 ENCOUNTER — Encounter (HOSPITAL_COMMUNITY)
Admission: RE | Disposition: A | Discharge status: Home / Self Care | Payer: Self-pay | Source: Ambulatory Visit | Attending: Cardiology

## 2016-11-05 DIAGNOSIS — Z9889 Other specified postprocedural states: Secondary | ICD-10-CM | POA: Diagnosis not present

## 2016-11-05 DIAGNOSIS — Z8673 Personal history of transient ischemic attack (TIA), and cerebral infarction without residual deficits: Secondary | ICD-10-CM | POA: Insufficient documentation

## 2016-11-05 DIAGNOSIS — R600 Localized edema: Secondary | ICD-10-CM | POA: Insufficient documentation

## 2016-11-05 DIAGNOSIS — E669 Obesity, unspecified: Secondary | ICD-10-CM | POA: Diagnosis not present

## 2016-11-05 DIAGNOSIS — Z833 Family history of diabetes mellitus: Secondary | ICD-10-CM | POA: Diagnosis not present

## 2016-11-05 DIAGNOSIS — I10 Essential (primary) hypertension: Secondary | ICD-10-CM | POA: Diagnosis not present

## 2016-11-05 DIAGNOSIS — F419 Anxiety disorder, unspecified: Secondary | ICD-10-CM | POA: Insufficient documentation

## 2016-11-05 DIAGNOSIS — I4892 Unspecified atrial flutter: Secondary | ICD-10-CM | POA: Diagnosis not present

## 2016-11-05 DIAGNOSIS — Z6834 Body mass index (BMI) 34.0-34.9, adult: Secondary | ICD-10-CM | POA: Insufficient documentation

## 2016-11-05 DIAGNOSIS — I5032 Chronic diastolic (congestive) heart failure: Secondary | ICD-10-CM | POA: Diagnosis not present

## 2016-11-05 DIAGNOSIS — Z91048 Other nonmedicinal substance allergy status: Secondary | ICD-10-CM | POA: Insufficient documentation

## 2016-11-05 DIAGNOSIS — R0600 Dyspnea, unspecified: Secondary | ICD-10-CM | POA: Diagnosis not present

## 2016-11-05 DIAGNOSIS — I4819 Other persistent atrial fibrillation: Secondary | ICD-10-CM

## 2016-11-05 DIAGNOSIS — Z7901 Long term (current) use of anticoagulants: Secondary | ICD-10-CM | POA: Diagnosis not present

## 2016-11-05 DIAGNOSIS — Z8249 Family history of ischemic heart disease and other diseases of the circulatory system: Secondary | ICD-10-CM | POA: Insufficient documentation

## 2016-11-05 DIAGNOSIS — Z79899 Other long term (current) drug therapy: Secondary | ICD-10-CM | POA: Diagnosis not present

## 2016-11-05 DIAGNOSIS — F17210 Nicotine dependence, cigarettes, uncomplicated: Secondary | ICD-10-CM | POA: Diagnosis not present

## 2016-11-05 DIAGNOSIS — I481 Persistent atrial fibrillation: Secondary | ICD-10-CM

## 2016-11-05 DIAGNOSIS — I110 Hypertensive heart disease with heart failure: Secondary | ICD-10-CM | POA: Insufficient documentation

## 2016-11-05 DIAGNOSIS — I4891 Unspecified atrial fibrillation: Secondary | ICD-10-CM | POA: Insufficient documentation

## 2016-11-05 HISTORY — PX: CARDIOVERSION: SHX1299

## 2016-11-05 SURGERY — CARDIOVERSION
Anesthesia: General

## 2016-11-05 MED ORDER — PROPOFOL 10 MG/ML IV BOLUS
INTRAVENOUS | Status: DC | PRN
  Administered 2016-11-05: 60 mg via INTRAVENOUS

## 2016-11-05 MED ORDER — LIDOCAINE 2% (20 MG/ML) 5 ML SYRINGE
INTRAMUSCULAR | Status: DC | PRN
  Administered 2016-11-05: 60 mg via INTRAVENOUS

## 2016-11-05 MED ORDER — SODIUM CHLORIDE 0.9 % IV SOLN
INTRAVENOUS | Status: DC
  Administered 2016-11-05: 10:00:00 via INTRAVENOUS

## 2016-11-05 NOTE — CV Procedure (Addendum)
   Electrical Cardioversion Procedure Note Brian [name] [id number] 07-07-1950  Procedure: Electrical Cardioversion Indications:  Atrial Fibrillation  Time Out: Verified patient identification, verified procedure,medications/allergies/relevent history reviewed, required imaging and test results available.  Performed  Procedure Details  The patient was NPO after midnight. Anesthesia was administered at the beside  by Dr.Hodierne with 60mg  of propofol and 60mg  of Lidocaine.  Cardioversion was done with synchronized biphasic defibrillation with AP pads with 150watts.  The patient converted to normal sinus rhythm. The patient tolerated the procedure well   IMPRESSION:  Successful cardioversion of atrial fibrillation  Traci Turner 11/05/2016, 9:52 AM   Addendum: Shortly after DCCV patient went back into atrial flutter with CVR.  Discussed with Dr. Marlou Porch and will proceed with referral to afib clinic.  He is scheduled for Thursday 11/12/2016  Signed: Fransico Him, MD [facility] 11/05/2016

## 2016-11-05 NOTE — Anesthesia Postprocedure Evaluation (Signed)
Anesthesia Post Note  Patient: Brian [name]  Procedure(s) Performed: Procedure(s) (LRB): CARDIOVERSION (N/A)  Patient location during evaluation: PACU Anesthesia Type: General Level of consciousness: awake and alert and patient cooperative Pain management: pain level controlled Vital Signs Assessment: post-procedure vital signs reviewed and stable Respiratory status: spontaneous breathing and respiratory function stable Cardiovascular status: stable Anesthetic complications: no       Last Vitals:  Vitals:   11/05/16 1020 11/05/16 1026  BP:  129/67  Pulse: 89   Resp: (!) 25   Temp:  36.8 C    Last Pain:  Vitals:   11/05/16 1026  TempSrc: Oral                 [name]

## 2016-11-05 NOTE — H&P (View-Only) (Signed)
Cardiology Office Note   Date:  11/03/2016   ID:  Brian [name], DOB 1950/11/29, MRN [id number]  PCP:  Orpah Melter, MD  Cardiologist:  Dr. Marlou Porch    Chief Complaint  Patient presents with  . Atrial Flutter      History of Present Illness: Brian [name] is a 66 y.o. male who presents for a flutter with RVR last seen by Dr. Marlou Porch 10/23/16.  His amiodarone was increased to 200 mg BID, toprol XL 25 mg daily.  Plan if pt has not converdted then would arrange DCCV.   + chronic diastolic HF  With lower ext edema on last visit.  Lasix increased to 80 mg BID.  He did not wish to go to the hospital.   Pt was to see EP for ablation but he did not wish to have to wait for all of that.  He would prefer DCCV.  He feels better with respiration on higher dose of Lasix and edema is better but wight did not go down that much.  He has not missed any Xarelto - he has been taking every day with meal.  No chest pain. An no bleeding issues. He does not wish to be out of work for any length of time currently.     Past Medical History:  Diagnosis Date  . Anxiety   . Atrial flutter ([place])    a. 08/2012  . CHF (congestive heart failure) ([place])   . Hypertension   . Obesity   . Shortness of breath   . Tobacco abuse     Past Surgical History:  Procedure Laterality Date  . IR THORACENTESIS ASP PLEURAL SPACE W/IMG GUIDE  10/01/2016  . MICROLARYNGOSCOPY  09/02/2007   with excision of right vocal cord mass, Dr. Constance Holster     Current Outpatient Prescriptions  Medication Sig Dispense Refill  . amiodarone (PACERONE) 200 MG tablet Take 200 mg twice a day 60 tablet 6  . furosemide (LASIX) 40 MG tablet Take 2 tablets ( 80 mg ) twice a day 120 tablet 6  . hydrOXYzine (ATARAX/VISTARIL) 25 MG tablet Take 25 mg by mouth daily as needed for anxiety.    . metoprolol succinate (TOPROL XL) 25 MG 24 hr tablet Take 1 tablet (25 mg total) by mouth daily. 30 tablet 6  . potassium chloride SA (K-DUR,KLOR-CON) 20 MEQ  tablet Take 1 tablet (20 mEq total) by mouth daily. 90 tablet 3  . rivaroxaban (XARELTO) 20 MG TABS tablet Take 1 tablet (20 mg total) by mouth daily with supper. 30 tablet 3   No current facility-administered medications for this visit.     Allergies:   Tape    Social History:  The patient  reports that he has been smoking Cigarettes.  He has a 50.00 pack-year smoking history. He has never used smokeless tobacco. He reports that he drinks alcohol. He reports that he does not use drugs.    Family History:  The patient's family history includes Cancer in his father and mother; Diabetes in his sister; Heart attack in his father; Heart disease in his father.    ROS:  General:no colds or fevers, mild weight decrease with lasix Skin:no rashes or ulcers HEENT:no blurred vision, no congestion CV:see HPI PUL:see HPI GI:no diarrhea constipation or melena, no indigestion GU:no hematuria, no dysuria MS:no joint pain, no claudication Neuro:no syncope, no lightheadedness Endo:no diabetes, no thyroid disease  Wt Readings from Last 3 Encounters:  11/03/16 238 lb (108 kg)  10/23/16  242 lb (109.8 kg)  10/01/16 240 lb (108.9 kg)     PHYSICAL EXAM: VS:  BP 126/70   Pulse 80   Ht 5\' 10"  (1.778 m)   Wt 238 lb (108 kg)   SpO2 94%   BMI 34.15 kg/m  , BMI Body mass index is 34.15 kg/m. General:Pleasant affect, NAD Skin:Warm and dry, brisk capillary refill HEENT:normocephalic, sclera clear, mucus membranes moist Neck:supple, no JVD, no bruits  Heart:irreg irreg  without murmur, gallup, rub or click Lungs:clear without rales, rhonchi, or wheezes UUE:[employer], non tender, + BS, do not palpate liver spleen or masses Ext:tr lower ext edema, 2+ pedal pulses, 2+ radial pulses Neuro:alert and oriented X 3, MAE, follows commands, + facial symmetry    EKG:  EKG is ordered today. The ekg ordered today demonstrates A flutter rate of 89 rightward axis.     Recent Labs: 09/30/2016: B Natriuretic  Peptide 153.5 10/01/2016: Magnesium 2.0 10/02/2016: ALT 47; BUN 13; Creatinine, Ser 0.56; Hemoglobin 12.7; Platelets 415; Potassium 4.4; Sodium 139    Lipid Panel    Component Value Date/Time   CHOL 135 09/28/2015 0402   TRIG 126 09/28/2015 0402   HDL 34 (L) 09/28/2015 0402   CHOLHDL 4.0 09/28/2015 0402   VLDL 25 09/28/2015 0402   LDLCALC 76 09/28/2015 0402       Other studies Reviewed: Additional studies/ records that were reviewed today include: . Echo 09/28/15 Study Conclusions  - Left ventricle: The cavity size was normal. Systolic function was   normal. The estimated ejection fraction was in the range of 55%   to 60%. Wall motion was normal; there were no regional wall   motion abnormalities. Left ventricular diastolic function   parameters were normal. - Aortic valve: There was trivial regurgitation. Valve area (VTI):   1.79 cm^2. Valve area (Vmax): 2.01 cm^2. Valve area (Vmean): 2   cm^2. - Atrial septum: There was increased thickness of the septum,   consistent with lipomatous hypertrophy.   ASSESSMENT AND PLAN:  1.  Persistent a flutter now rate controlled and on higher dose of lasix his dyspnea is improved.  On xarelto and has not missed any doses.  Does not wish to spend the time with ablation.  Prefers to have DCCV - will schedule for Thursday.  Pt has had before and understands plan.   2.  Chronic diastolic HF improved with 4 lb wt loss.  Will continue lasix 80 BID for now will check bmp today and may need to decrease lasix.    3. Tobacco use, half a PPD we would like to stop and may stop once back in SR.  May want to try Chantix again.   Current medicines are reviewed with the patient today.  The patient Has no concerns regarding medicines.  The following changes have been made:  See above Labs/ tests ordered today include:see above  Disposition:   FU:  see above  Signed, Cecilie Kicks, NP  11/03/2016 4:36 PM    [facility] [place], [place], [place] Phone: [phone]; Fax: [phone]

## 2016-11-05 NOTE — Interval H&P Note (Signed)
History and Physical Interval Note:  11/05/2016 9:51 AM  Brian [name]  has presented today for surgery, with the diagnosis of AFIB  The various methods of treatment have been discussed with the patient and family. After consideration of risks, benefits and other options for treatment, the patient has consented to  Procedure(s): CARDIOVERSION (N/A) as a surgical intervention .  The patient's history has been reviewed, patient examined, no change in status, stable for surgery.  I have reviewed the patient's chart and labs.  Questions were answered to the patient's satisfaction.     Fransico Him

## 2016-11-05 NOTE — Addendum Note (Signed)
Addended by: Mendel Ryder on: 11/05/2016 09:47 AM   Modules accepted: Orders

## 2016-11-05 NOTE — Anesthesia Preprocedure Evaluation (Signed)
Anesthesia Evaluation  Patient identified by MRN, date of birth, ID band Patient awake    Reviewed: Allergy & Precautions, H&P , NPO status , Patient's Chart, lab work & pertinent test results  Airway Mallampati: II   Neck ROM: full    Dental   Pulmonary shortness of breath, Current Smoker,    breath sounds clear to auscultation       Cardiovascular hypertension, +CHF  + dysrhythmias Atrial Fibrillation  Rhythm:irregular Rate:Normal     Neuro/Psych PSYCHIATRIC DISORDERS Anxiety TIA   GI/Hepatic   Endo/Other    Renal/GU      Musculoskeletal  (+) Arthritis ,   Abdominal   Peds  Hematology   Anesthesia Other Findings   Reproductive/Obstetrics                             Anesthesia Physical Anesthesia Plan  ASA: III  Anesthesia Plan: General   Post-op Pain Management:    Induction: Intravenous  Airway Management Planned: Mask  Additional Equipment:   Intra-op Plan:   Post-operative Plan:   Informed Consent: I have reviewed the patients History and Physical, chart, labs and discussed the procedure including the risks, benefits and alternatives for the proposed anesthesia with the patient or authorized representative who has indicated his/her understanding and acceptance.     Plan Discussed with: CRNA, Anesthesiologist and Surgeon  Anesthesia Plan Comments:         Anesthesia Quick Evaluation

## 2016-11-05 NOTE — Transfer of Care (Signed)
Immediate Anesthesia Transfer of Care Note  Patient: COLVIN BLATT  Procedure(s) Performed: Procedure(s): CARDIOVERSION (N/A)  Patient Location: Endoscopy Unit  Anesthesia Type:General  Level of Consciousness: awake, alert  and oriented  Airway & Oxygen Therapy: Patient Spontanous Breathing  Post-op Assessment: Report given to RN and Post -op Vital signs reviewed and stable  Post vital signs: Reviewed and stable  Last Vitals:  Vitals:   11/05/16 1019 11/05/16 1020  BP:    Pulse: 92 89  Resp: (!) 24 (!) 25  Temp:      Last Pain:  Vitals:   11/05/16 0925  TempSrc: Oral         Complications: No apparent anesthesia complications

## 2016-11-05 NOTE — Discharge Instructions (Signed)
Electrical Cardioversion, Care After °This sheet gives you information about how to care for yourself after your procedure. Your health care provider may also give you more specific instructions. If you have problems or questions, contact your health care provider. °What can I expect after the procedure? °After the procedure, it is common to have: °· Some redness on the skin where the shocks were given. °Follow these instructions at home: °· Do not drive for 24 hours if you were given a medicine to help you relax (sedative). °· Take over-the-counter and prescription medicines only as told by your health care provider. °· Ask your health care provider how to check your pulse. Check it often. °· Rest for 48 hours after the procedure or as told by your health care provider. °· Avoid or limit your caffeine use as told by your health care provider. °Contact a health care provider if: °· You feel like your heart is beating too quickly or your pulse is not regular. °· You have a serious muscle cramp that does not go away. °Get help right away if: °· You have discomfort in your chest. °· You are dizzy or you feel faint. °· You have trouble breathing or you are short of breath. °· Your speech is slurred. °· You have trouble moving an arm or leg on one side of your body. °· Your fingers or toes turn cold or blue. °This information is not intended to replace advice given to you by your health care provider. Make sure you discuss any questions you have with your health care provider. °Document Released: 04/05/2013 Document Revised: 01/17/2016 Document Reviewed: 12/20/2015 °Elsevier Interactive Patient Education © 2017 [employer] ° °

## 2016-11-06 ENCOUNTER — Encounter (HOSPITAL_COMMUNITY): Payer: Self-pay | Admitting: Cardiology

## 2016-11-11 ENCOUNTER — Encounter (HOSPITAL_COMMUNITY): Payer: Self-pay | Admitting: *Deleted

## 2016-11-11 ENCOUNTER — Ambulatory Visit: Payer: PPO | Admitting: Cardiology

## 2016-11-11 ENCOUNTER — Emergency Department (HOSPITAL_BASED_OUTPATIENT_CLINIC_OR_DEPARTMENT_OTHER)
Admit: 2016-11-11 | Discharge: 2016-11-11 | Disposition: A | Discharge status: Home / Self Care | Payer: PPO | Attending: Emergency Medicine | Admitting: Emergency Medicine

## 2016-11-11 ENCOUNTER — Emergency Department (HOSPITAL_COMMUNITY): Payer: PPO

## 2016-11-11 ENCOUNTER — Emergency Department (HOSPITAL_COMMUNITY)
Admission: EM | Admit: 2016-11-11 | Discharge: 2016-11-11 | Disposition: A | Discharge status: Home / Self Care | Payer: PPO | Attending: Emergency Medicine | Admitting: Emergency Medicine

## 2016-11-11 DIAGNOSIS — I110 Hypertensive heart disease with heart failure: Secondary | ICD-10-CM | POA: Insufficient documentation

## 2016-11-11 DIAGNOSIS — I483 Typical atrial flutter: Secondary | ICD-10-CM | POA: Diagnosis not present

## 2016-11-11 DIAGNOSIS — Z8673 Personal history of transient ischemic attack (TIA), and cerebral infarction without residual deficits: Secondary | ICD-10-CM | POA: Insufficient documentation

## 2016-11-11 DIAGNOSIS — M25462 Effusion, left knee: Secondary | ICD-10-CM | POA: Diagnosis not present

## 2016-11-11 DIAGNOSIS — F17210 Nicotine dependence, cigarettes, uncomplicated: Secondary | ICD-10-CM | POA: Diagnosis not present

## 2016-11-11 DIAGNOSIS — Z79899 Other long term (current) drug therapy: Secondary | ICD-10-CM | POA: Insufficient documentation

## 2016-11-11 DIAGNOSIS — I5032 Chronic diastolic (congestive) heart failure: Secondary | ICD-10-CM | POA: Insufficient documentation

## 2016-11-11 DIAGNOSIS — M25562 Pain in left knee: Secondary | ICD-10-CM | POA: Insufficient documentation

## 2016-11-11 DIAGNOSIS — R0602 Shortness of breath: Secondary | ICD-10-CM | POA: Diagnosis not present

## 2016-11-11 DIAGNOSIS — M79609 Pain in unspecified limb: Secondary | ICD-10-CM

## 2016-11-11 LAB — COMPREHENSIVE METABOLIC PANEL
ALBUMIN: 3.3 g/dL — AB (ref 3.5–5.0)
ALT: 39 U/L (ref 17–63)
AST: 53 U/L — AB (ref 15–41)
Alkaline Phosphatase: 85 U/L (ref 38–126)
Anion gap: 8 (ref 5–15)
BILIRUBIN TOTAL: 0.5 mg/dL (ref 0.3–1.2)
BUN: 15 mg/dL (ref 6–20)
CHLORIDE: 104 mmol/L (ref 101–111)
CO2: 28 mmol/L (ref 22–32)
CREATININE: 0.76 mg/dL (ref 0.61–1.24)
Calcium: 8.8 mg/dL — ABNORMAL LOW (ref 8.9–10.3)
GFR calc Af Amer: >60 mL/min (ref >60)
GLUCOSE: 125 mg/dL — AB (ref 65–99)
POTASSIUM: 4.5 mmol/L (ref 3.5–5.1)
Sodium: 140 mmol/L (ref 135–145)
TOTAL PROTEIN: 8 g/dL (ref 6.5–8.1)

## 2016-11-11 LAB — CBC
HEMATOCRIT: 36.4 % — AB (ref 39.0–52.0)
Hemoglobin: 11.3 g/dL — ABNORMAL LOW (ref 13.0–17.0)
MCH: 27.2 pg (ref 26.0–34.0)
MCHC: 31 g/dL (ref 30.0–36.0)
MCV: 87.5 fL (ref 78.0–100.0)
PLATELETS: 445 10*3/uL — AB (ref 150–400)
RBC: 4.16 MIL/uL — ABNORMAL LOW (ref 4.22–5.81)
RDW: 13.9 % (ref 11.5–15.5)
WBC: 17.4 10*3/uL — AB (ref 4.0–10.5)

## 2016-11-11 LAB — I-STAT TROPONIN, ED: Troponin i, poc: 0 ng/mL (ref 0.00–0.08)

## 2016-11-11 LAB — BRAIN NATRIURETIC PEPTIDE: B Natriuretic Peptide: 160.7 pg/mL — ABNORMAL HIGH (ref 0.0–100.0)

## 2016-11-11 LAB — PROTIME-INR
INR: 1.13
PROTHROMBIN TIME: 14.6 s (ref 11.4–15.2)

## 2016-11-11 MED ORDER — HYDROCODONE-ACETAMINOPHEN 5-325 MG PO TABS: 1.0000 | ORAL_TABLET | Freq: Four times a day (QID) | ORAL | 0 refills | Status: DC | PRN

## 2016-11-11 MED ORDER — HYDROCODONE-ACETAMINOPHEN 5-325 MG PO TABS
1.0000 | ORAL_TABLET | Freq: Once | ORAL | Status: AC
  Administered 2016-11-11: 1 via ORAL
  Filled 2016-11-11: qty 1

## 2016-11-11 NOTE — Progress Notes (Signed)
Orthopedic Tech Progress Note Patient Details:  Brian [name] [birth year] [id number]  Ortho Devices Type of Ortho Device: Knee Immobilizer Ortho Device/Splint Location: lle Ortho Device/Splint Interventions: Application   Mindy Behnken 11/11/2016, 3:22 PM

## 2016-11-11 NOTE — ED Notes (Addendum)
Urine sample given in triage and placed in triage if needed.

## 2016-11-11 NOTE — ED Notes (Signed)
Pt ambulated to RR .  

## 2016-11-11 NOTE — ED Provider Notes (Signed)
[facility] DEPT Provider Note   CSN: [id number] Arrival date & time: 11/11/16  9024     History   Chief Complaint Chief Complaint  Patient presents with  . Leg Swelling  . Shortness of Breath    HPI Brian [name] is a 66 y.o. male.  Patient followed by cardiology for history of atrial fib more trouble with atrial flutter recently. On amiodarone and beta blocker. Patient's main concern today though was swelling to his left leg and pain in the left leg, from the knee down to midcalf area. In triage EKG raised concerns for an acute STEMI. However patient's EKG is unchanged from previous. Patient has no chest pain no shortness of breath. Chief complaint out in triage did include shortness of breath the patient really didn't have any. Patient has no numbness to his of left leg.      Past Medical History:  Diagnosis Date  . Anxiety   . Atrial flutter ([place])    a. 08/2012  . CHF (congestive heart failure) ([place])   . Hypertension   . Obesity   . Shortness of breath   . Tobacco abuse     Patient Active Problem List   Diagnosis Date Noted  . Persistent atrial fibrillation ([place])   . Dyspnea   . Positive urine drug screen   . CAP (community acquired pneumonia) 09/30/2016  . Community acquired pneumonia of left lower lobe of lung ([place])   . Pleural effusion   . Tachypnea   . CVA (cerebral infarction) 09/27/2015  . Right hand pain 09/27/2015  . History of gout 09/27/2015  . Cocaine abuse 09/27/2015  . TIA (transient ischemic attack) 09/27/2015  . Slurred speech   . Primary osteoarthritis of right hand   . Leukocytosis   . Essential hypertension   . Chronic diastolic CHF (congestive heart failure) ([place])   . HTN (hypertension) 11/22/2013  . Chronic diastolic heart failure ([place]) 11/22/2013  . Noncompliance 11/17/2013  . Atrial flutter with rapid ventricular response ([place]) 11/16/2013  . Acute on chronic diastolic CHF (congestive heart failure) ([place]) 09/05/2012  . Tobacco  abuse   . Obesity- BMI 35   . Diastolic CHF ([place])   . Atrial flutter ([place])   . Anxiety     Past Surgical History:  Procedure Laterality Date  . CARDIOVERSION N/A 11/05/2016   Procedure: CARDIOVERSION;  Surgeon: Sueanne Margarita, MD;  Location: [facility];  Service: Cardiovascular;  Laterality: N/A;  . IR THORACENTESIS ASP PLEURAL SPACE W/IMG GUIDE  10/01/2016  . MICROLARYNGOSCOPY  09/02/2007   with excision of right vocal cord mass, Dr. Constance Holster       Home Medications    Prior to Admission medications   Medication Sig Start Date End Date Taking? Authorizing Provider  amiodarone (PACERONE) 200 MG tablet Take 200 mg twice a day Patient taking differently: Take 200 mg by mouth 2 (two) times daily. Take 200 mg twice a day 10/23/16  Yes Jerline Pain, MD  furosemide (LASIX) 40 MG tablet Take 2 tablets ( 80 mg ) twice a day 10/23/16  Yes Jerline Pain, MD  hydrOXYzine (ATARAX/VISTARIL) 25 MG tablet Take 25 mg by mouth daily as needed for anxiety.   Yes [provider]  ibuprofen (ADVIL,MOTRIN) 200 MG tablet Take 200 mg by mouth every 6 (six) hours as needed for moderate pain.   Yes [provider]  metoprolol succinate (TOPROL XL) 25 MG 24 hr tablet Take 1 tablet (25 mg total) by mouth  daily. 10/23/16  Yes Jerline Pain, MD  potassium chloride SA (K-DUR,KLOR-CON) 20 MEQ tablet Take 1 tablet (20 mEq total) by mouth daily. 10/26/16  Yes Jerline Pain, MD  rivaroxaban (XARELTO) 20 MG TABS tablet Take 1 tablet (20 mg total) by mouth daily with supper. 01/17/16  Yes Isaiah Serge, NP  HYDROcodone-acetaminophen (NORCO/VICODIN) 5-325 MG tablet Take 1-2 tablets by mouth every 6 (six) hours as needed for moderate pain. 11/11/16   Fredia Sorrow, MD    Family History Family History  Problem Relation Age of Onset  . Cancer Mother   . Heart disease Father   . Heart attack Father   . Cancer Father   . Diabetes Sister     Social History Social History  Substance Use Topics  .  Smoking status: Current Some Day Smoker    Packs/day: 1.00    Years: 50.00    Types: Cigarettes  . Smokeless tobacco: Never Used     Comment: quit 11/04/12  . Alcohol use Yes     Comment: socially     Allergies   Tape   Review of Systems Review of Systems  Constitutional: Negative for fever.  HENT: Negative for congestion.   Eyes: Negative for visual disturbance.  Respiratory: Negative for shortness of breath.   Cardiovascular: Positive for leg swelling. Negative for chest pain.  Gastrointestinal: Negative for abdominal pain.  Genitourinary: Negative for dysuria.  Musculoskeletal: Negative for back pain.  Skin: Negative for rash.  Neurological: Negative for headaches.  Hematological: Bruises/bleeds easily.  Psychiatric/Behavioral: Negative for confusion.     Physical Exam Updated Vital Signs BP (!) 142/94 (BP Location: Right Arm)   Pulse (!) 114   Temp 98.5 F (36.9 C) (Oral)   Resp (!) 25   Ht 5\' 10"  (1.778 m)   Wt 105.7 kg   SpO2 97%   BMI 33.43 kg/m   Physical Exam  Constitutional: He is oriented to person, place, and time. He appears well-developed and well-nourished. No distress.  HENT:  Head: Normocephalic and atraumatic.  Eyes: Conjunctivae and EOM are normal. Pupils are equal, round, and reactive to light.  Neck: Normal range of motion. Neck supple.  Cardiovascular: Normal rate and regular rhythm.   Tachycardic  Pulmonary/Chest: Effort normal and breath sounds normal. No respiratory distress.  Abdominal: Soft. Bowel sounds are normal. There is no tenderness.  Musculoskeletal: Normal range of motion. He exhibits edema.  Swelling to the left leg increased swelling around the calf area. No obvious joint effusion patellas intact not dislocated.  Neurological: He is alert and oriented to person, place, and time. No cranial nerve deficit or sensory deficit. He exhibits normal muscle tone. Coordination normal.  Nursing note and vitals reviewed.    ED  Treatments / Results  Labs (all labs ordered are listed, but only abnormal results are displayed) Labs Reviewed  CBC - Abnormal; Notable for the following:       Result Value   WBC 17.4 (*)    RBC 4.16 (*)    Hemoglobin 11.3 (*)    HCT 36.4 (*)    Platelets 445 (*)    All other components within normal limits  COMPREHENSIVE METABOLIC PANEL - Abnormal; Notable for the following:    Glucose, Bld 125 (*)    Calcium 8.8 (*)    Albumin 3.3 (*)    AST 53 (*)    All other components within normal limits  BRAIN NATRIURETIC PEPTIDE - Abnormal; Notable for the following:  B Natriuretic Peptide 160.7 (*)    All other components within normal limits  PROTIME-INR  I-STAT TROPOININ, ED   Results for orders placed or performed during the hospital encounter of 11/11/16  CBC  Result Value Ref Range   WBC 17.4 (H) 4.0 - 10.5 K/uL   RBC 4.16 (L) 4.22 - 5.81 MIL/uL   Hemoglobin 11.3 (L) 13.0 - 17.0 g/dL   HCT 36.4 (L) 39.0 - 52.0 %   MCV 87.5 78.0 - 100.0 fL   MCH 27.2 26.0 - 34.0 pg   MCHC 31.0 30.0 - 36.0 g/dL   RDW 13.9 11.5 - 15.5 %   Platelets 445 (H) 150 - 400 K/uL  Protime-INR (order if Patient is taking Coumadin / Warfarin)  Result Value Ref Range   Prothrombin Time 14.6 11.4 - 15.2 seconds   INR 1.13   Comprehensive metabolic panel  Result Value Ref Range   Sodium 140 135 - 145 mmol/L   Potassium 4.5 3.5 - 5.1 mmol/L   Chloride 104 101 - 111 mmol/L   CO2 28 22 - 32 mmol/L   Glucose, Bld 125 (H) 65 - 99 mg/dL   BUN 15 6 - 20 mg/dL   Creatinine, Ser 0.76 0.61 - 1.24 mg/dL   Calcium 8.8 (L) 8.9 - 10.3 mg/dL   Total Protein 8.0 6.5 - 8.1 g/dL   Albumin 3.3 (L) 3.5 - 5.0 g/dL   AST 53 (H) 15 - 41 U/L   ALT 39 17 - 63 U/L   Alkaline Phosphatase 85 38 - 126 U/L   Total Bilirubin 0.5 0.3 - 1.2 mg/dL   GFR calc non Af Amer >60 >60 mL/min   GFR calc Af Amer >60 >60 mL/min   Anion gap 8 5 - 15  Brain natriuretic peptide  Result Value Ref Range   B Natriuretic Peptide 160.7  (H) 0.0 - 100.0 pg/mL  I-stat troponin, ED  Result Value Ref Range   Troponin i, poc 0.00 0.00 - 0.08 ng/mL   Comment 3             EKG  EKG Interpretation  Date/Time:  Wednesday Nov 11 2016 09:46:57 EDT Ventricular Rate:  117 PR Interval:    QRS Duration: 84 QT Interval:  402 QTC Calculation: 560 R Axis:   96 Text Interpretation:  Atrial flutter with variable A-V block Rightward axis Cannot rule out Anterior infarct , age undetermined Inferior injury pattern Prolonged QT rate faster but otherwise No significant change since last tracing Abnormal ECG Confirmed by Fredia Sorrow [phone]) on 11/11/2016 9:58:34 AM       Radiology Dg Chest 2 View  Result Date: 11/11/2016 CLINICAL DATA:  Shortness of breath. EXAM: CHEST  2 VIEW COMPARISON:  Radiographs of October 02, 2016. FINDINGS: Stable cardiomegaly. No pneumothorax is noted. Right lung is clear. Mild left loculated pleural effusion is noted which is significantly smaller compared to prior exam. No pulmonary consolidation is noted. Bony thorax is unremarkable. IMPRESSION: Mild left loculated pleural effusion which is smaller compared to prior exam. Electronically Signed   By: Marijo Conception, M.D.   On: 11/11/2016 10:48   Dg Knee Complete 4 Views Left  Result Date: 11/11/2016 CLINICAL DATA:  Left knee swelling today. EXAM: LEFT KNEE - COMPLETE 4+ VIEW COMPARISON:  None. FINDINGS: Small to moderate joint effusion without fracture, malalignment, or erosion. Enthesophyte seen from the inferior pole patella. No marginal spurring or detected joint narrowing. IMPRESSION: Joint effusion without acute osseous finding or  degenerative joint narrowing. Electronically Signed   By: Monte Fantasia M.D.   On: 11/11/2016 14:51    Procedures Procedures (including critical care time)  Medications Ordered in ED Medications  HYDROcodone-acetaminophen (NORCO/VICODIN) 5-325 MG per tablet 1 tablet (1 tablet Oral Given 11/11/16 1453)     Initial  Impression / Assessment and Plan / ED Course  I have reviewed the triage vital signs and the nursing notes.  Pertinent labs & imaging results that were available during my care of the patient were reviewed by me and considered in my medical decision making (see chart for details).    Patient is sleepy evaluated in the trauma resuscitation area due to concerns for STEMI. EKG comparison showed no significant change. Patient also had no chest pain. Patient came today without concern for his left leg. Patient's initial EKG however did show atrial fibrillation with rate above 100 of more typically around 120. That slowly resolved here in the emergency department without any treatment. Workup without any acute findings. Patient had Doppler studies of his left leg showed no clot but showed some fluid around the knee and into the calf area suggestive perhaps a ruptured Baker cyst. X-rays show concerns for effusion clinically not present no bony abnormalities.  Patient treated with pain medicine knee immobilizer follow-up with orthopedics. In addition patient has follow-up with cardiology tomorrow. Patient's atrial fibrillation he is on blood thinners already. Patient's heart rate came down below 100. No reason for admission for the atrial fibrillation.   Final Clinical Impressions(s) / ED Diagnoses   Final diagnoses:  Typical atrial flutter (HCC)  Acute pain of left knee    New Prescriptions New Prescriptions   HYDROCODONE-ACETAMINOPHEN (NORCO/VICODIN) 5-325 MG TABLET    Take 1-2 tablets by mouth every 6 (six) hours as needed for moderate pain.     Fredia Sorrow, MD 11/11/16 1535

## 2016-11-11 NOTE — ED Notes (Signed)
Got PT in gown and on montior

## 2016-11-11 NOTE — Discharge Instructions (Signed)
Follow-up with cardiology as scheduled for tomorrow. Negative limited follow-up with orthopedics. Where you need immobilizer for comfort. Take pain medicine as needed. And as directed. Return for any new or worse symptoms. X-ray of the left knee showed no bony fractures or dislocation. Does show fluid.

## 2016-11-11 NOTE — Progress Notes (Signed)
*  PRELIMINARY RESULTS* Vascular Ultrasound Left lower extremity venous duplex has been completed.  Preliminary findings: the visualized veins of the left lower extremity appear negative for deep vein thrombosis.  Large heterogeneous fluid collection extending from the popliteal fossa into the mid left calf, aprox 9.7cm, possible ruptured bakers cyst.  Myrtie Cruise Tyana Butzer 11/11/2016, 11:08 AM

## 2016-11-11 NOTE — ED Triage Notes (Signed)
Pt reports having cardioversion done on 5/10, shortly after went back into afib. Pt having leg swelling and pain since then, more severe in left leg. Reports sob with exertion.

## 2016-11-12 ENCOUNTER — Ambulatory Visit (HOSPITAL_COMMUNITY)
Admission: RE | Admit: 2016-11-12 | Discharge: 2016-11-12 | Disposition: A | Discharge status: Home / Self Care | Payer: PPO | Source: Ambulatory Visit | Attending: Nurse Practitioner | Admitting: Nurse Practitioner

## 2016-11-12 ENCOUNTER — Encounter (HOSPITAL_COMMUNITY): Payer: Self-pay | Admitting: Nurse Practitioner

## 2016-11-12 VITALS — BP 132/76 | HR 102 | Ht 70.0 in | Wt 238.8 lb

## 2016-11-12 DIAGNOSIS — I5030 Unspecified diastolic (congestive) heart failure: Secondary | ICD-10-CM | POA: Diagnosis not present

## 2016-11-12 DIAGNOSIS — F419 Anxiety disorder, unspecified: Secondary | ICD-10-CM | POA: Insufficient documentation

## 2016-11-12 DIAGNOSIS — Z7901 Long term (current) use of anticoagulants: Secondary | ICD-10-CM | POA: Diagnosis not present

## 2016-11-12 DIAGNOSIS — I483 Typical atrial flutter: Secondary | ICD-10-CM | POA: Diagnosis not present

## 2016-11-12 DIAGNOSIS — I4581 Long QT syndrome: Secondary | ICD-10-CM | POA: Diagnosis not present

## 2016-11-12 DIAGNOSIS — I4892 Unspecified atrial flutter: Secondary | ICD-10-CM

## 2016-11-12 DIAGNOSIS — I4439 Other atrioventricular block: Secondary | ICD-10-CM | POA: Insufficient documentation

## 2016-11-12 DIAGNOSIS — I5032 Chronic diastolic (congestive) heart failure: Secondary | ICD-10-CM | POA: Diagnosis not present

## 2016-11-12 DIAGNOSIS — Z79899 Other long term (current) drug therapy: Secondary | ICD-10-CM | POA: Diagnosis not present

## 2016-11-12 DIAGNOSIS — I110 Hypertensive heart disease with heart failure: Secondary | ICD-10-CM | POA: Diagnosis not present

## 2016-11-12 DIAGNOSIS — F17210 Nicotine dependence, cigarettes, uncomplicated: Secondary | ICD-10-CM | POA: Insufficient documentation

## 2016-11-12 MED ORDER — AMIODARONE HCL 200 MG PO TABS: 200.0000 mg | ORAL_TABLET | Freq: Every day | ORAL | 6 refills | Status: DC

## 2016-11-12 NOTE — Patient Instructions (Signed)
Your physician has recommended you make the following change in your medication:  1)Amiodarone to 200mg  once a day

## 2016-11-13 ENCOUNTER — Ambulatory Visit (INDEPENDENT_AMBULATORY_CARE_PROVIDER_SITE_OTHER): Payer: PPO | Admitting: Orthopaedic Surgery

## 2016-11-13 ENCOUNTER — Encounter (INDEPENDENT_AMBULATORY_CARE_PROVIDER_SITE_OTHER): Payer: Self-pay | Admitting: Orthopaedic Surgery

## 2016-11-13 DIAGNOSIS — M25462 Effusion, left knee: Secondary | ICD-10-CM | POA: Insufficient documentation

## 2016-11-13 DIAGNOSIS — I5033 Acute on chronic diastolic (congestive) heart failure: Secondary | ICD-10-CM | POA: Diagnosis not present

## 2016-11-13 NOTE — Progress Notes (Signed)
Primary Care Physician: Orpah Melter, MD Referring Physician: Dr. Johnsie Cancel Cardiologist: Dr. Marlou Porch( previous Dr. Mare Ferrari)   Brian [name] is a 66 y.o. male with a long standing h/o atrial flutter, diastolic heart failure, former Dr. Mare Ferrari patient with anxiety. He was recently in the hospital for pneumonia and aflutter with rvr. Dr. Marlou Porch saw on consult in the hospital on 10/01/2016 when he was in atrial flutter. IV amiodarone was utilized. Less than 24 hours he converted. He was sent home on by mouth amiodarone 200 mg once a day and Xarelto. He states that he has not missed a dose of Xarelto. He states that his lower extremities remain edematous like they were prior to his hospitalization. He is taking twice as much Lasix, 40 mg twice a day. When he was seen f/u by Dr. Marlou Porch , he continues in flutter. He was scheduled for cardioversion for 5/10. He did shock out but only stayed in rhythm for a few minutes.Amiodarone was kept at 200 mg bid. He did go top the ED 5/26 for painful leg and r/o for DVT.   In  the afib clinic today, he remains in flutter rate controlled. EKG reads STEMI, as well as previous EKG's but ST changes 2/2 aflutter. He denies alcohol use. Positive for tobacco use. Denies snoring history. Review  of EKG's from 2014 shows all atrial flutter that I can see and it appears typical.   Today, he denies symptoms of palpitations, chest pain, shortness of breath, orthopnea, PND, lower extremity edema, dizziness, presyncope, syncope, or neurologic sequela. The patient is tolerating medications without difficulties and is otherwise without complaint today.   Past Medical History:  Diagnosis Date  . Anxiety   . Atrial flutter ([place])    a. 08/2012  . CHF (congestive heart failure) ([place])   . Hypertension   . Obesity   . Shortness of breath   . Tobacco abuse    Past Surgical History:  Procedure Laterality Date  . CARDIOVERSION N/A 11/05/2016   Procedure: CARDIOVERSION;   Surgeon: Sueanne Margarita, MD;  Location: [facility];  Service: Cardiovascular;  Laterality: N/A;  . IR THORACENTESIS ASP PLEURAL SPACE W/IMG GUIDE  10/01/2016  . MICROLARYNGOSCOPY  09/02/2007   with excision of right vocal cord mass, Dr. Constance Holster    Current Outpatient Prescriptions  Medication Sig Dispense Refill  . amiodarone (PACERONE) 200 MG tablet Take 1 tablet (200 mg total) by mouth daily. 60 tablet 6  . furosemide (LASIX) 40 MG tablet Take 2 tablets ( 80 mg ) twice a day 120 tablet 6  . HYDROcodone-acetaminophen (NORCO/VICODIN) 5-325 MG tablet Take 1-2 tablets by mouth every 6 (six) hours as needed for moderate pain. 20 tablet 0  . hydrOXYzine (ATARAX/VISTARIL) 25 MG tablet Take 25 mg by mouth daily as needed for anxiety.    Marland Kitchen ibuprofen (ADVIL,MOTRIN) 200 MG tablet Take 200 mg by mouth every 6 (six) hours as needed for moderate pain.    . metoprolol succinate (TOPROL XL) 25 MG 24 hr tablet Take 1 tablet (25 mg total) by mouth daily. 30 tablet 6  . potassium chloride SA (K-DUR,KLOR-CON) 20 MEQ tablet Take 1 tablet (20 mEq total) by mouth daily. 90 tablet 3  . rivaroxaban (XARELTO) 20 MG TABS tablet Take 1 tablet (20 mg total) by mouth daily with supper. 30 tablet 3   No current facility-administered medications for this encounter.     Allergies  Allergen Reactions  . Tape Rash    Paper tape is  preferred, please!!    Social History   Social History  . Marital status: Divorced    Spouse name: N/A  . Number of children: N/A  . Years of education: N/A   Occupational History  . Not on file.   Social History Main Topics  . Smoking status: Current Some Day Smoker    Packs/day: 1.00    Years: 50.00    Types: Cigarettes  . Smokeless tobacco: Never Used     Comment: quit 11/04/12  . Alcohol use Yes     Comment: socially  . Drug use: No  . Sexual activity: Not Currently   Other Topics Concern  . Not on file   Social History Narrative  . No narrative on file    Family  History  Problem Relation Age of Onset  . Cancer Mother   . Heart disease Father   . Heart attack Father   . Cancer Father   . Diabetes Sister     ROS- All systems are reviewed and negative except as per the HPI above  Physical Exam: Vitals:   11/12/16 0948  BP: 132/76  Pulse: (!) 102  Weight: 238 lb 12.8 oz (108.3 kg)  Height: 5\' 10"  (1.778 m)   Wt Readings from Last 3 Encounters:  11/12/16 238 lb 12.8 oz (108.3 kg)  11/11/16 233 lb (105.7 kg)  11/05/16 238 lb (108 kg)    Labs: Lab Results  Component Value Date   NA 140 11/11/2016   K 4.5 11/11/2016   CL 104 11/11/2016   CO2 28 11/11/2016   GLUCOSE 125 (H) 11/11/2016   BUN 15 11/11/2016   CREATININE 0.76 11/11/2016   CALCIUM 8.8 (L) 11/11/2016   PHOS 2.5 10/01/2016   MG 2.0 10/01/2016   Lab Results  Component Value Date   INR 1.13 11/11/2016   Lab Results  Component Value Date   CHOL 135 09/28/2015   HDL 34 (L) 09/28/2015   LDLCALC 76 09/28/2015   TRIG 126 09/28/2015     GEN- The patient is well appearing, alert and oriented x 3 today.   Head- normocephalic, atraumatic Eyes-  Sclera clear, conjunctiva pink Ears- hearing intact Oropharynx- clear Neck- supple, no JVP Lymph- no cervical lymphadenopathy Lungs- Clear to ausculation bilaterally, normal work of breathing Heart- Regular rate and rhythm, no murmurs, rubs or gallops, PMI not laterally displaced GI- soft, NT, ND, + BS Extremities- no clubbing, cyanosis, or edema MS- no significant deformity or atrophy Skin- no rash or lesion Psych- euthymic mood, full affect Neuro- strength and sensation are intact  EKG-Typical atrial flutter with variable rate qrs itn 86 ms, qtc 523 ms Previous ekgs reviewed and show typical flutter Echo-Study Conclusions  - Left ventricle: The cavity size was normal. Systolic function was   normal. The estimated ejection fraction was in the range of 55%   to 60%. Wall motion was normal; there were no regional wall    motion abnormalities. Left ventricular diastolic function   parameters were normal. - Aortic valve: There was trivial regurgitation. Valve area (VTI):   1.79 cm^2. Valve area (Vmax): 2.01 cm^2. Valve area (Vmean): 2   cm^2. - Atrial septum: There was increased thickness of the septum,   consistent with lipomatous hypertrophy.     Assessment and Plan: 1. Typical atrial flutter, failing amiodarone and cardioversion Reviewed ekg with Dr. Rayann Heman and we feel that pt would be a good typical a flutter candidate He can continue amiodarone but decrease dose to 200  mg one time a day Continue xarelto 20mg  for a chadsvasc score of 2  Consultation has been made for pt to see Dr. Rayann Heman to pursue atrial flutter ablation  Butch Penny C. Katiria Calame, [facility] [address] [place], [place] [address] [phone]

## 2016-11-13 NOTE — Progress Notes (Signed)
Office Visit Note   Patient: Brian [name]           Date of Birth: 21-Oct-1950           MRN: [id number] Visit Date: 11/13/2016              Requested by: Orpah Melter, MD [address] [place], [place] [address] PCP: Orpah Melter, MD   Assessment & Plan: Visit Diagnoses:  1. Effusion, left knee     Plan: Left knee was aspirated and yielded approximately 10 mL of blood-tinged effusion. This was sent off to the lab. We will be in touch with him about the results. If appropriate we may bring the patient back for cortisone injection.  Follow-Up Instructions: Return if symptoms worsen or fail to improve.   Orders:  Orders Placed This Encounter  Procedures  . Gram stain  . Cell count + diff,  w/ cryst-synvl fld   No orders of the defined types were placed in this encounter.     Procedures: Large Joint Inj Date/Time: 11/13/2016 11:46 PM Performed by: Leandrew Koyanagi Authorized by: Leandrew Koyanagi   Consent Given by:  Patient Timeout: prior to procedure the correct patient, procedure, and site was verified   Indications:  Pain Location:  Knee Site:  R knee Prep: patient was prepped and draped in usual sterile fashion   Needle Size:  22 G Ultrasound Guidance: No   Fluoroscopic Guidance: No   Arthrogram: No   Aspiration Attempted: Yes   Aspirate amount (mL):  10 Aspirate:  Blood-tinged Patient tolerance:  Patient tolerated the procedure well with no immediate complications     Clinical Data: No additional findings.   Subjective: Chief Complaint  Patient presents with  . Left Knee - Pain    Patient is a 66 year old gentleman comes in with left knee pain and swelling of insidious onset for couple days. He works as a Freight forwarder at [employer]. He was recently hospitalized for pneumonia. He denies any history of gout. Hydrocodone does help with the pain. He endorses pain and swelling.    Review of Systems  Constitutional: Negative.   All other  systems reviewed and are negative.    Objective: Vital Signs: There were no vitals taken for this visit.  Physical Exam  Constitutional: He is oriented to person, place, and time. He appears well-developed and well-nourished.  HENT:  Head: Normocephalic and atraumatic.  Eyes: Pupils are equal, round, and reactive to light.  Neck: Neck supple.  Pulmonary/Chest: Effort normal.  Abdominal: Soft.  Musculoskeletal: Normal range of motion.  Neurological: He is alert and oriented to person, place, and time.  Skin: Skin is warm.  Psychiatric: He has a normal mood and affect. His behavior is normal. Judgment and thought content normal.  Nursing note and vitals reviewed.   Ortho Exam Left knee exam shows a small effusion. There is no signs of infection. Normal range of motion. Collaterals and cruciates are stable. Specialty Comments:  No specialty comments available.  Imaging: No results found.   PMFS History: Patient Active Problem List   Diagnosis Date Noted  . Effusion, left knee 11/13/2016  . Persistent atrial fibrillation ([place])   . Dyspnea   . Positive urine drug screen   . CAP (community acquired pneumonia) 09/30/2016  . Community acquired pneumonia of left lower lobe of lung ([place])   . Pleural effusion   . Tachypnea   . CVA (cerebral infarction) 09/27/2015  .  Right hand pain 09/27/2015  . History of gout 09/27/2015  . Cocaine abuse 09/27/2015  . TIA (transient ischemic attack) 09/27/2015  . Slurred speech   . Primary osteoarthritis of right hand   . Leukocytosis   . Essential hypertension   . Chronic diastolic CHF (congestive heart failure) ([place])   . HTN (hypertension) 11/22/2013  . Chronic diastolic heart failure ([place]) 11/22/2013  . Noncompliance 11/17/2013  . Atrial flutter with rapid ventricular response ([place]) 11/16/2013  . Acute on chronic diastolic CHF (congestive heart failure) ([place]) 09/05/2012  . Tobacco abuse   . Obesity- BMI 35   . Diastolic CHF ([place])     . Atrial flutter ([place])   . Anxiety    Past Medical History:  Diagnosis Date  . Anxiety   . Atrial flutter ([place])    a. 08/2012  . CHF (congestive heart failure) ([place])   . Hypertension   . Obesity   . Shortness of breath   . Tobacco abuse     Family History  Problem Relation Age of Onset  . Cancer Mother   . Heart disease Father   . Heart attack Father   . Cancer Father   . Diabetes Sister     Past Surgical History:  Procedure Laterality Date  . CARDIOVERSION N/A 11/05/2016   Procedure: CARDIOVERSION;  Surgeon: Sueanne Margarita, MD;  Location: [facility];  Service: Cardiovascular;  Laterality: N/A;  . IR THORACENTESIS ASP PLEURAL SPACE W/IMG GUIDE  10/01/2016  . MICROLARYNGOSCOPY  09/02/2007   with excision of right vocal cord mass, Dr. Constance Holster   Social History   Occupational History  . Not on file.   Social History Main Topics  . Smoking status: Current Some Day Smoker    Packs/day: 1.00    Years: 50.00    Types: Cigarettes  . Smokeless tobacco: Never Used     Comment: quit 11/04/12  . Alcohol use Yes     Comment: socially  . Drug use: No  . Sexual activity: Not Currently

## 2016-11-14 LAB — SYNOVIAL CELL COUNT + DIFF, W/ CRYSTALS
Basophils, %: 0 %
Eosinophils-Synovial: 0 % (ref 0–2)
LYMPHOCYTES-SYNOVIAL FLD: 17 % (ref 0–74)
MONOCYTE/MACROPHAGE: 11 % (ref 0–69)
Neutrophil, Synovial: 72 % — ABNORMAL HIGH (ref 0–24)
SYNOVIOCYTES, %: 0 % (ref 0–15)
WBC, SYNOVIAL: 5076 {cells}/uL — AB (ref <150)

## 2016-11-14 LAB — GRAM STAIN
GRAM STAIN: NONE SEEN
Gram Stain: NONE SEEN

## 2016-11-14 LAB — ACID FAST CULTURE WITH REFLEXED SENSITIVITIES (MYCOBACTERIA): Acid Fast Culture: NEGATIVE

## 2016-11-16 NOTE — Progress Notes (Signed)
Should come back to discuss steroid injection if his knee is still hurting

## 2016-11-17 ENCOUNTER — Ambulatory Visit: Payer: PPO | Admitting: Cardiology

## 2016-11-18 ENCOUNTER — Ambulatory Visit (INDEPENDENT_AMBULATORY_CARE_PROVIDER_SITE_OTHER): Payer: PPO | Admitting: Internal Medicine

## 2016-11-18 ENCOUNTER — Encounter: Payer: Self-pay | Admitting: Internal Medicine

## 2016-11-18 VITALS — BP 128/74 | HR 116 | Ht 70.0 in | Wt 238.0 lb

## 2016-11-18 DIAGNOSIS — I483 Typical atrial flutter: Secondary | ICD-10-CM | POA: Diagnosis not present

## 2016-11-18 NOTE — Addendum Note (Signed)
Addended by: Janan Halter F on: 11/18/2016 10:50 AM   Modules accepted: Orders

## 2016-11-18 NOTE — Progress Notes (Signed)
Electrophysiology Office Note   Date:  11/18/2016   ID:  Brian, [name] 1951/03/17, MRN [id number]  PCP:  Orpah Melter, MD  Cardiologist:  Previously Dr Mare Ferrari Primary Electrophysiologist: [name], MD    Chief Complaint  Patient presents with  . New Patient (Initial Visit)    Atrial flutter     History of Present Illness: Brian [name] is a 66 y.o. male who presents today for electrophysiology evaluation.   The patient is referred to EP for consultation regarding treatment strategies for atrial flutter by Roderic Palau NP.  He has had intermittent atrial flutter since 2014.  He has been evaluated by Dr Mare Ferrari and has been on amiodarone chronically.  He has not had afib documented.  He reports SOB and fatigue with atrial flutter.  He has chronic difficulty with edema/ diastolic dysfunction.  Today, he denies symptoms of palpitations, chest pain, shortness of breath, orthopnea, PND,  claudication, dizziness, presyncope, syncope, bleeding, or neurologic sequela. The patient is tolerating medications without difficulties and is otherwise without complaint today.    Past Medical History:  Diagnosis Date  . Anxiety   . Atrial flutter ([place])    a. 08/2012  . CHF (congestive heart failure) ([place])   . Hypertension   . Obesity   . Shortness of breath   . Tobacco abuse    Past Surgical History:  Procedure Laterality Date  . CARDIOVERSION N/A 11/05/2016   Procedure: CARDIOVERSION;  Surgeon: Sueanne Margarita, MD;  Location: [facility];  Service: Cardiovascular;  Laterality: N/A;  . IR THORACENTESIS ASP PLEURAL SPACE W/IMG GUIDE  10/01/2016  . MICROLARYNGOSCOPY  09/02/2007   with excision of right vocal cord mass, Dr. Constance Holster     Current Outpatient Prescriptions  Medication Sig Dispense Refill  . acetaminophen (TYLENOL) 500 MG tablet Take 1,000 mg by mouth every 6 (six) hours as needed (pain).    [place] Kitchen amiodarone (PACERONE) 200 MG tablet Take 1 tablet (200 mg total) by  mouth daily. 60 tablet 6  . Docusate Calcium (STOOL SOFTENER PO) Take 2 capsules by mouth 2 (two) times daily.    . furosemide (LASIX) 40 MG tablet Take 80 mg by mouth 2 (two) times daily.    [place] Kitchen HYDROcodone-acetaminophen (NORCO/VICODIN) 5-325 MG tablet Take 1-2 tablets by mouth every 6 (six) hours as needed for moderate pain. 20 tablet 0  . hydrOXYzine (ATARAX/VISTARIL) 25 MG tablet Take 25 mg by mouth daily as needed for anxiety.    . metoprolol succinate (TOPROL XL) 25 MG 24 hr tablet Take 1 tablet (25 mg total) by mouth daily. 30 tablet 6  . potassium chloride SA (K-DUR,KLOR-CON) 20 MEQ tablet Take 1 tablet (20 mEq total) by mouth daily. 90 tablet 3  . rivaroxaban (XARELTO) 20 MG TABS tablet Take 1 tablet (20 mg total) by mouth daily with supper. 30 tablet 3   No current facility-administered medications for this visit.     Allergies:   Tape   Social History:  The patient  reports that he has been smoking Cigarettes.  He has a 50.00 pack-year smoking history. He has never used smokeless tobacco. He reports that he drinks alcohol. He reports that he does not use drugs.   Family History:  The patient's  family history includes Cancer in his father and mother; Diabetes in his sister; Heart attack in his father; Heart disease in his father.    ROS:  Please see the history of present illness.   All other systems  are personally reviewed and negative.    PHYSICAL EXAM: VS:  BP 128/74   Pulse (!) 116   Ht 5\' 10"  (1.778 m)   Wt 238 lb (108 kg)   SpO2 94%   BMI 34.15 kg/m  , BMI Body mass index is 34.15 kg/m. GEN: Well nourished, well developed, in no acute distress  HEENT: normal  Neck: no JVD, carotid bruits, or masses Cardiac: iRRR; no murmurs, rubs, or gallops,+1 edema  Respiratory:  clear to auscultation bilaterally, normal work of breathing GI: soft, nontender, nondistended, + BS MS: no deformity or atrophy  Skin: warm and dry  Neuro:  Strength and sensation are intact Psych:  euthymic mood, full affect  EKG:  EKG is ordered today. The ekg ordered today is personally reviewed and shows atrial flutter  ekg tracings going back to 2014 are personally reviewed and reveals atrial flutter.  I did not see any documentation of afib.   Recent Labs: 10/01/2016: Magnesium 2.0 11/03/2016: TSH 2.400 11/11/2016: ALT 39; B Natriuretic Peptide 160.7; BUN 15; Creatinine, Ser 0.76; Hemoglobin 11.3; Platelets 445; Potassium 4.5; Sodium 140  personally reviewed   Lipid Panel     Component Value Date/Time   CHOL 135 09/28/2015 0402   TRIG 126 09/28/2015 0402   HDL 34 (L) 09/28/2015 0402   CHOLHDL 4.0 09/28/2015 0402   VLDL 25 09/28/2015 0402   LDLCALC 76 09/28/2015 0402   personally reviewed   Wt Readings from Last 3 Encounters:  11/18/16 238 lb (108 kg)  11/12/16 238 lb 12.8 oz (108.3 kg)  11/11/16 233 lb (105.7 kg)      Other studies personally reviewed: Additional studies/ records that were reviewed today include: AF clinic notes, prior echo  Review of the above records today demonstrates: as above   ASSESSMENT AND PLAN:  1.  Typical appearing atrial flutter The patient has symptomatic typical appearing atrial flutter for which he is symptomatic and has failed medical therapy with amiodarone.  Recent cardioversion failed to maintain sinus rhythm.  I do not see any episodes of afib Therapeutic strategies for atrial flutter including medicine and ablation were discussed in detail with the patient today. Risk, benefits, and alternatives to EP study and radiofrequency ablation were also discussed in detail today. These risks include but are not limited to stroke, bleeding, vascular damage, tamponade, perforation, damage to the heart and other structures, AV block requiring pacemaker, worsening renal function, and death. The patient understands these risk and wishes to proceed.  We will therefore proceed with catheter ablation at the next available time.  Importance of  compliance with xarelto without interruption was discussed with the patient. Will stop amiodarone post ablation and then monitor for afib going forward.   Current medicines are reviewed at length with the patient today.   The patient does not have concerns regarding his medicines.  The following changes were made today:  none   Signed, [name], MD  11/18/2016 10:14 AM     [facility] [address] [place] [address] [phone] (office) [phone] (fax)

## 2016-11-18 NOTE — Addendum Note (Signed)
Addended by: Janan Halter F on: 11/18/2016 10:59 AM   Modules accepted: Orders, SmartSet

## 2016-11-18 NOTE — Patient Instructions (Addendum)
Medication Instructions:  Your physician recommends that you continue on your current medications as directed. Please refer to the Current Medication list given to you today.    Labwork: Your physician recommends that you return for lab work today: BMP/CBC   Testing/Procedures: Your physician has recommended that you have an ablation. Catheter ablation is a medical procedure used to treat some cardiac arrhythmias (irregular heartbeats). During catheter ablation, a long, thin, flexible tube is put into a blood vessel in your groin (upper thigh), or neck. This tube is called an ablation catheter. It is then guided to your heart through the blood vessel. Radio frequency waves destroy small areas of heart tissue where abnormal heartbeats may cause an arrhythmia to start. Please see the instruction sheet given to you today.--12/03/16   Please arrive at [facility] at5:30am Do not eat or drink after midnight the night prior to the procedure Do not take any medications the morning of the test Plan for one night stay Will need someone to drive you home at discharge     Follow-Up: Your physician recommends that you schedule a follow-up appointment around week of 12/28/16 with Dr Rayann Heman.  Post ablation   Any Other Special Instructions Will Be Listed Below (If Applicable).    Will need to be out of work starting 12/03/16 and returning on 12/14/16.  Please call with any questions.

## 2016-11-19 LAB — CBC WITH DIFFERENTIAL/PLATELET
BASOS ABS: 0.1 10*3/uL (ref 0.0–0.2)
Basos: 1 %
EOS (ABSOLUTE): 0.9 10*3/uL — ABNORMAL HIGH (ref 0.0–0.4)
Eos: 7 %
Hematocrit: 34.3 % — ABNORMAL LOW (ref 37.5–51.0)
Hemoglobin: 11.1 g/dL — ABNORMAL LOW (ref 13.0–17.7)
IMMATURE GRANULOCYTES: 2 %
Immature Grans (Abs): 0.2 10*3/uL — ABNORMAL HIGH (ref 0.0–0.1)
Lymphocytes Absolute: 2.5 10*3/uL (ref 0.7–3.1)
Lymphs: 18 %
MCH: 27.3 pg (ref 26.6–33.0)
MCHC: 32.4 g/dL (ref 31.5–35.7)
MCV: 84 fL (ref 79–97)
MONOS ABS: 1.3 10*3/uL — AB (ref 0.1–0.9)
Monocytes: 9 %
NEUTROS PCT: 63 %
Neutrophils Absolute: 8.8 10*3/uL — ABNORMAL HIGH (ref 1.4–7.0)
PLATELETS: 464 10*3/uL — AB (ref 150–379)
RBC: 4.07 x10E6/uL — AB (ref 4.14–5.80)
RDW: 14.1 % (ref 12.3–15.4)
WBC: 13.9 10*3/uL — ABNORMAL HIGH (ref 3.4–10.8)

## 2016-11-19 LAB — BASIC METABOLIC PANEL
BUN/Creatinine Ratio: 24 (ref 10–24)
BUN: 20 mg/dL (ref 8–27)
CALCIUM: 8.9 mg/dL (ref 8.6–10.2)
CHLORIDE: 97 mmol/L (ref 96–106)
CO2: 24 mmol/L (ref 18–29)
Creatinine, Ser: 0.83 mg/dL (ref 0.76–1.27)
GFR, EST AFRICAN AMERICAN: 107 mL/min/{1.73_m2} (ref >59)
GFR, EST NON AFRICAN AMERICAN: 92 mL/min/{1.73_m2} (ref >59)
Glucose: 86 mg/dL (ref 65–99)
Potassium: 4.9 mmol/L (ref 3.5–5.2)
Sodium: 138 mmol/L (ref 134–144)

## 2016-11-23 ENCOUNTER — Emergency Department (HOSPITAL_COMMUNITY)
Admission: EM | Admit: 2016-11-23 | Discharge: 2016-11-23 | Disposition: A | Discharge status: Home / Self Care | Payer: PPO | Attending: Emergency Medicine | Admitting: Emergency Medicine

## 2016-11-23 ENCOUNTER — Encounter (HOSPITAL_COMMUNITY): Payer: Self-pay | Admitting: *Deleted

## 2016-11-23 DIAGNOSIS — F17210 Nicotine dependence, cigarettes, uncomplicated: Secondary | ICD-10-CM | POA: Diagnosis not present

## 2016-11-23 DIAGNOSIS — M25562 Pain in left knee: Secondary | ICD-10-CM

## 2016-11-23 DIAGNOSIS — Z8673 Personal history of transient ischemic attack (TIA), and cerebral infarction without residual deficits: Secondary | ICD-10-CM | POA: Insufficient documentation

## 2016-11-23 DIAGNOSIS — Z79899 Other long term (current) drug therapy: Secondary | ICD-10-CM | POA: Diagnosis not present

## 2016-11-23 DIAGNOSIS — I110 Hypertensive heart disease with heart failure: Secondary | ICD-10-CM | POA: Diagnosis not present

## 2016-11-23 DIAGNOSIS — Z7901 Long term (current) use of anticoagulants: Secondary | ICD-10-CM | POA: Insufficient documentation

## 2016-11-23 DIAGNOSIS — I5032 Chronic diastolic (congestive) heart failure: Secondary | ICD-10-CM | POA: Diagnosis not present

## 2016-11-23 MED ORDER — HYDROCODONE-ACETAMINOPHEN 5-325 MG PO TABS: 1.0000 | ORAL_TABLET | ORAL | 0 refills | Status: DC | PRN

## 2016-11-23 NOTE — Discharge Instructions (Signed)
Take your medication as prescribed as needed for pain. I also recommend continuing to rest, elevate and apply ice to your left knee for 15-20 minutes 3-4 times daily. Follow-up with your orthopedist at your scheduled appointment tomorrow afternoon for follow-up evaluation and further management including your cortisone injection. Return to the emergency department if symptoms worsen or new onset of fever, redness, warmth, worsening swelling, numbness, weakness, chest pain, shortness of breath.

## 2016-11-23 NOTE — ED Provider Notes (Signed)
[facility] DEPT Provider Note    By signing my name below, I, Bea Graff, attest that this documentation has been prepared under the direction and in the presence of Harlene Ramus, PA-C. Electronically Signed: Bea Graff, ED Scribe. 11/23/16. 1:39 PM.    History   Chief Complaint Chief Complaint  Patient presents with  . Knee Pain    The history is provided by the patient and medical records. No language interpreter was used.    Brian [name] is a 66 y.o. male who presents to the Emergency Department complaining of left knee pain that began worsening today. He reports associated worsening swelling and continued pain. He was seen in the ED 12 days ago and negative doppler study but suspected ruptured Baker's cyst. He was then seen by orthopedics, Dr. Erlinda Hong, 10 days ago and had a left knee arthrocentesis. He has been icing and elevating the area with no significant relief. He normally takes Vicodin for the pain but has recently ran out. Bearing weight increases the pain. He denies alleviating factors. He denies trauma, injury or fall. He denies fever, chills, CP, SOB, numbness, tingling or weakness of the LLE. He has a follow up appt with Dr. Erlinda Hong tomorrow. Pt is on Xarelto.   Past Medical History:  Diagnosis Date  . Anxiety   . Atrial flutter ([place])    a. 08/2012  . CHF (congestive heart failure) ([place])   . Hypertension   . Obesity   . Shortness of breath   . Tobacco abuse     Patient Active Problem List   Diagnosis Date Noted  . Effusion, left knee 11/13/2016  . Persistent atrial fibrillation ([place])   . Dyspnea   . Positive urine drug screen   . CAP (community acquired pneumonia) 09/30/2016  . Community acquired pneumonia of left lower lobe of lung ([place])   . Pleural effusion   . Tachypnea   . CVA (cerebral infarction) 09/27/2015  . Right hand pain 09/27/2015  . History of gout 09/27/2015  . Cocaine abuse 09/27/2015  . TIA (transient ischemic attack)  09/27/2015  . Slurred speech   . Primary osteoarthritis of right hand   . Leukocytosis   . Essential hypertension   . Chronic diastolic CHF (congestive heart failure) ([place])   . HTN (hypertension) 11/22/2013  . Chronic diastolic heart failure ([place]) 11/22/2013  . Noncompliance 11/17/2013  . Atrial flutter with rapid ventricular response ([place]) 11/16/2013  . Acute on chronic diastolic CHF (congestive heart failure) ([place]) 09/05/2012  . Tobacco abuse   . Obesity- BMI 35   . Diastolic CHF ([place])   . Atrial flutter ([place])   . Anxiety     Past Surgical History:  Procedure Laterality Date  . CARDIOVERSION N/A 11/05/2016   Procedure: CARDIOVERSION;  Surgeon: Sueanne Margarita, MD;  Location: [facility];  Service: Cardiovascular;  Laterality: N/A;  . IR THORACENTESIS ASP PLEURAL SPACE W/IMG GUIDE  10/01/2016  . MICROLARYNGOSCOPY  09/02/2007   with excision of right vocal cord mass, Dr. Constance Holster       Home Medications    Prior to Admission medications   Medication Sig Start Date End Date Taking? Authorizing Provider  acetaminophen (TYLENOL) 500 MG tablet Take 1,000 mg by mouth every 6 (six) hours as needed (pain).    [provider]  amiodarone (PACERONE) 200 MG tablet Take 1 tablet (200 mg total) by mouth daily. 11/12/16   Sherran Needs, NP  Docusate Calcium (STOOL SOFTENER PO) Take 2 capsules by  mouth 2 (two) times daily.    [provider]  furosemide (LASIX) 40 MG tablet Take 80 mg by mouth 2 (two) times daily.    [provider]  HYDROcodone-acetaminophen (NORCO/VICODIN) 5-325 MG tablet Take 1 tablet by mouth every 4 (four) hours as needed. 11/23/16   Nona Dell, PA-C  hydrOXYzine (ATARAX/VISTARIL) 25 MG tablet Take 25 mg by mouth daily as needed for anxiety.    [provider]  metoprolol succinate (TOPROL XL) 25 MG 24 hr tablet Take 1 tablet (25 mg total) by mouth daily. 10/23/16   Jerline Pain, MD  potassium chloride SA (K-DUR,KLOR-CON) 20  MEQ tablet Take 1 tablet (20 mEq total) by mouth daily. 10/26/16   Jerline Pain, MD  rivaroxaban (XARELTO) 20 MG TABS tablet Take 1 tablet (20 mg total) by mouth daily with supper. 01/17/16   Isaiah Serge, NP    Family History Family History  Problem Relation Age of Onset  . Cancer Mother   . Heart disease Father   . Heart attack Father   . Cancer Father   . Diabetes Sister     Social History Social History  Substance Use Topics  . Smoking status: Current Some Day Smoker    Packs/day: 1.00    Years: 50.00    Types: Cigarettes  . Smokeless tobacco: Never Used     Comment: quit 11/04/12  . Alcohol use Yes     Comment: socially     Allergies   Tape   Review of Systems Review of Systems  Constitutional: Negative for chills and fever.  Musculoskeletal: Positive for arthralgias and joint swelling.  Neurological: Negative for weakness and numbness.     Physical Exam Updated Vital Signs BP (!) 151/77 (BP Location: Right Arm)   Pulse 90   Temp 98.3 F (36.8 C) (Oral)   Resp 16   Ht 5\' 10"  (1.778 m)   Wt 233 lb (105.7 kg)   SpO2 93%   BMI 33.43 kg/m   Physical Exam  Constitutional: He is oriented to person, place, and time. He appears well-developed and well-nourished.  HENT:  Head: Normocephalic and atraumatic.  Eyes: Conjunctivae and EOM are normal. Right eye exhibits no discharge. Left eye exhibits no discharge. No scleral icterus.  Neck: Normal range of motion. Neck supple.  Cardiovascular: Normal rate and intact distal pulses.   Pulmonary/Chest: Effort normal.  Musculoskeletal: Normal range of motion. He exhibits edema and tenderness. He exhibits no deformity.  Mild diffuse swelling and tenderness to left knee without erythema, warmth, abrasion, contusion or joint effusion. 1+ pitting edema present to BLE. Full ROM of bilateral hips, knees, ankles and feet with 5/5 strength. 2+ DP pulses. Sensations grossly intact.  Neurological: He is alert and oriented to  person, place, and time.  Skin: Skin is warm and dry. Capillary refill takes less than 2 seconds.  Nursing note and vitals reviewed.    ED Treatments / Results  DIAGNOSTIC STUDIES: Oxygen Saturation is 93% on RA.  COORDINATION OF CARE: 1:34 PM- Recommended follow up with Dr. Erlinda Hong tomorrow. Will prescribe Vicodin. Pt verbalizes understanding and agrees to plan.  Medications - No data to display  Labs (all labs ordered are listed, but only abnormal results are displayed) Labs Reviewed - No data to display  EKG  EKG Interpretation None       Radiology No results found.  Procedures Procedures (including critical care time)  Medications Ordered in ED Medications - No data to  display   Initial Impression / Assessment and Plan / ED Course  I have reviewed the triage vital signs and the nursing notes.  Pertinent labs & imaging results that were available during my care of the patient were reviewed by me and considered in my medical decision making (see chart for details).     Patient presents with continued left knee pain and swelling for the past 1-2 weeks. Denies any recent fall, trauma or injury. Denies fever, chest pain, shortness of breath. Patient was initially seen in the ED on 5/16 for similar symptoms, was noted to have mild joint effusion on the x-ray, Doppler performed in the ED revealed no DVTs but evidence of possible Baker's cyst rupture. Patient was then seen by orthopedist on 5/18 where he had an arthrocentesis performed. Patient reports having a follow-up appointment scheduled with his orthopedist tomorrow afternoon with plan for having cortisone injection performed. VSS. Exam revealed mild diffuse swelling and tenderness to left knee with 1+ pitting edema present to bilateral lower extremities. Pt is able ambulate with cane, no bony abnormality or deformity, no erythema or excessive heat, no evidence of cellulitis, DVT, or septic joint. Suspect patient's symptoms  are likely due to osteoarthritis. Patient reports he mainly came to the ED today due to worsening pain and being unable to control pain at home once he ran out of his prescription of Vicodin. [place] Controlled Substance reporting System queried, patient filled a prescription for Vicodin 5-325mg  #20 on 11/11/16. Plan to discharge patient home with short dose of pain meds and advised to follow-up with his orthopedist tomorrow for further management and evaluation. Discussed return precautions.  Final Clinical Impressions(s) / ED Diagnoses   Final diagnoses:  Left knee pain, unspecified chronicity    New Prescriptions New Prescriptions   HYDROCODONE-ACETAMINOPHEN (NORCO/VICODIN) 5-325 MG TABLET    Take 1 tablet by mouth every 4 (four) hours as needed.    I personally performed the services described in this documentation, which was scribed in my presence. The recorded information has been reviewed and is accurate.     Nona Dell, PA-C 11/23/16 1346    Virgel Manifold, MD 11/29/16 2011

## 2016-11-23 NOTE — ED Triage Notes (Addendum)
Pt reports running out of hydrocodone earlier this week for pain in the L knee, pt had fluid removed last week, pt here today c/o of worsening pain today, pt has appt tomorrow, pt has swelling to R L knee, A&O x4

## 2016-11-24 ENCOUNTER — Ambulatory Visit (INDEPENDENT_AMBULATORY_CARE_PROVIDER_SITE_OTHER): Payer: PPO | Admitting: Orthopaedic Surgery

## 2016-11-24 DIAGNOSIS — M25462 Effusion, left knee: Secondary | ICD-10-CM

## 2016-11-24 DIAGNOSIS — M1712 Unilateral primary osteoarthritis, left knee: Secondary | ICD-10-CM | POA: Diagnosis not present

## 2016-11-24 MED ORDER — LIDOCAINE HCL 1 % IJ SOLN
2.0000 mL | INTRAMUSCULAR | Status: AC | PRN
  Administered 2016-11-24: 2 mL

## 2016-11-24 MED ORDER — BUPIVACAINE HCL 0.5 % IJ SOLN
2.0000 mL | INTRAMUSCULAR | Status: AC | PRN
  Administered 2016-11-24: 2 mL via INTRA_ARTICULAR

## 2016-11-24 MED ORDER — METHYLPREDNISOLONE ACETATE 40 MG/ML IJ SUSP
40.0000 mg | INTRAMUSCULAR | Status: AC | PRN
  Administered 2016-11-24: 40 mg via INTRA_ARTICULAR

## 2016-11-24 NOTE — Progress Notes (Signed)
Office Visit Note   Patient: Brian [name]           Date of Birth: 04-03-1951           MRN: [id number] Visit Date: 11/24/2016              Requested by: Orpah Melter, MD [address] , [place] [address] PCP: Orpah Melter, MD   Assessment & Plan: Visit Diagnoses:  1. Effusion, left knee   2. Unilateral primary osteoarthritis, left knee     Plan: Left knee was injected with cortisone. Patient had good relief during the anesthetic phase. Follow-up with me as needed. Overall impression is osteoarthritis flareup.  Follow-Up Instructions: Return if symptoms worsen or fail to improve.   Orders:  No orders of the defined types were placed in this encounter.  No orders of the defined types were placed in this encounter.     Procedures: Large Joint Inj Date/Time: 11/24/2016 4:12 PM Performed by: Leandrew Koyanagi Authorized by: Leandrew Koyanagi   Consent Given by:  Patient Timeout: prior to procedure the correct patient, procedure, and site was verified   Indications:  Pain Location:  Knee Site:  R knee Prep: patient was prepped and draped in usual sterile fashion   Needle Size:  22 G Ultrasound Guidance: No   Fluoroscopic Guidance: No   Arthrogram: No   Medications:  2 mL lidocaine 1 %; 2 mL bupivacaine 0.5 %; 40 mg methylPREDNISolone acetate 40 MG/ML Patient tolerance:  Patient tolerated the procedure well with no immediate complications     Clinical Data: No additional findings.   Subjective: Chief Complaint  Patient presents with  . Right Knee - Pain    Mr. Spinney follows up today for his left knee pain. He has slightly worsening swelling. His previous aspiration showed 5500 white blood cells without crystals. He is requesting knee injection today. He denies any constitutional symptoms.    Review of Systems   Objective: Vital Signs: There were no vitals taken for this visit.  Physical Exam  Ortho Exam Left knee exam shows a small  effusion. No signs of infection. Specialty Comments:  No specialty comments available.  Imaging: No results found.   PMFS History: Patient Active Problem List   Diagnosis Date Noted  . Effusion, left knee 11/13/2016  . Persistent atrial fibrillation ([place])   . Dyspnea   . Positive urine drug screen   . CAP (community acquired pneumonia) 09/30/2016  . Community acquired pneumonia of left lower lobe of lung ([place])   . Pleural effusion   . Tachypnea   . CVA (cerebral infarction) 09/27/2015  . Right hand pain 09/27/2015  . History of gout 09/27/2015  . Cocaine abuse 09/27/2015  . TIA (transient ischemic attack) 09/27/2015  . Slurred speech   . Primary osteoarthritis of right hand   . Leukocytosis   . Essential hypertension   . Chronic diastolic CHF (congestive heart failure) ([place])   . HTN (hypertension) 11/22/2013  . Chronic diastolic heart failure ([place]) 11/22/2013  . Noncompliance 11/17/2013  . Atrial flutter with rapid ventricular response ([place]) 11/16/2013  . Acute on chronic diastolic CHF (congestive heart failure) ([place]) 09/05/2012  . Tobacco abuse   . Obesity- BMI 35   . Diastolic CHF ([place])   . Atrial flutter ([place])   . Anxiety    Past Medical History:  Diagnosis Date  . Anxiety   . Atrial flutter ([place])    a. 08/2012  .  CHF (congestive heart failure) ([place])   . Hypertension   . Obesity   . Shortness of breath   . Tobacco abuse     Family History  Problem Relation Age of Onset  . Cancer Mother   . Heart disease Father   . Heart attack Father   . Cancer Father   . Diabetes Sister     Past Surgical History:  Procedure Laterality Date  . CARDIOVERSION N/A 11/05/2016   Procedure: CARDIOVERSION;  Surgeon: Sueanne Margarita, MD;  Location: [facility];  Service: Cardiovascular;  Laterality: N/A;  . IR THORACENTESIS ASP PLEURAL SPACE W/IMG GUIDE  10/01/2016  . MICROLARYNGOSCOPY  09/02/2007   with excision of right vocal cord mass, Dr. Constance Holster   Social History    Occupational History  . Not on file.   Social History Main Topics  . Smoking status: Current Some Day Smoker    Packs/day: 1.00    Years: 50.00    Types: Cigarettes  . Smokeless tobacco: Never Used     Comment: quit 11/04/12  . Alcohol use Yes     Comment: socially  . Drug use: No  . Sexual activity: Not Currently

## 2016-11-29 ENCOUNTER — Inpatient Hospital Stay (HOSPITAL_COMMUNITY)
Admission: EM | Admit: 2016-11-29 | Discharge: 2016-12-04 | DRG: 871 | Disposition: A | Discharge status: Home / Self Care | Payer: PPO | Attending: Internal Medicine | Admitting: Internal Medicine

## 2016-11-29 DIAGNOSIS — M1611 Unilateral primary osteoarthritis, right hip: Secondary | ICD-10-CM | POA: Diagnosis not present

## 2016-11-29 DIAGNOSIS — I5032 Chronic diastolic (congestive) heart failure: Secondary | ICD-10-CM | POA: Diagnosis not present

## 2016-11-29 DIAGNOSIS — M25579 Pain in unspecified ankle and joints of unspecified foot: Secondary | ICD-10-CM | POA: Diagnosis not present

## 2016-11-29 DIAGNOSIS — R079 Chest pain, unspecified: Secondary | ICD-10-CM | POA: Diagnosis not present

## 2016-11-29 DIAGNOSIS — A419 Sepsis, unspecified organism: Principal | ICD-10-CM

## 2016-11-29 DIAGNOSIS — Z79899 Other long term (current) drug therapy: Secondary | ICD-10-CM

## 2016-11-29 DIAGNOSIS — I110 Hypertensive heart disease with heart failure: Secondary | ICD-10-CM | POA: Diagnosis present

## 2016-11-29 DIAGNOSIS — M064 Inflammatory polyarthropathy: Secondary | ICD-10-CM | POA: Diagnosis present

## 2016-11-29 DIAGNOSIS — M1990 Unspecified osteoarthritis, unspecified site: Secondary | ICD-10-CM

## 2016-11-29 DIAGNOSIS — R609 Edema, unspecified: Secondary | ICD-10-CM

## 2016-11-29 DIAGNOSIS — R7982 Elevated C-reactive protein (CRP): Secondary | ICD-10-CM | POA: Diagnosis present

## 2016-11-29 DIAGNOSIS — R0602 Shortness of breath: Secondary | ICD-10-CM | POA: Diagnosis not present

## 2016-11-29 DIAGNOSIS — F17210 Nicotine dependence, cigarettes, uncomplicated: Secondary | ICD-10-CM | POA: Diagnosis not present

## 2016-11-29 DIAGNOSIS — M25512 Pain in left shoulder: Secondary | ICD-10-CM | POA: Diagnosis not present

## 2016-11-29 DIAGNOSIS — E669 Obesity, unspecified: Secondary | ICD-10-CM | POA: Diagnosis present

## 2016-11-29 DIAGNOSIS — M25539 Pain in unspecified wrist: Secondary | ICD-10-CM | POA: Diagnosis present

## 2016-11-29 DIAGNOSIS — I10 Essential (primary) hypertension: Secondary | ICD-10-CM | POA: Diagnosis not present

## 2016-11-29 DIAGNOSIS — M79643 Pain in unspecified hand: Secondary | ICD-10-CM | POA: Diagnosis present

## 2016-11-29 DIAGNOSIS — M25529 Pain in unspecified elbow: Secondary | ICD-10-CM | POA: Diagnosis not present

## 2016-11-29 DIAGNOSIS — I4892 Unspecified atrial flutter: Secondary | ICD-10-CM

## 2016-11-29 DIAGNOSIS — F419 Anxiety disorder, unspecified: Secondary | ICD-10-CM | POA: Diagnosis not present

## 2016-11-29 DIAGNOSIS — M130 Polyarthritis, unspecified: Secondary | ICD-10-CM | POA: Diagnosis not present

## 2016-11-29 DIAGNOSIS — M25519 Pain in unspecified shoulder: Secondary | ICD-10-CM

## 2016-11-29 DIAGNOSIS — F1010 Alcohol abuse, uncomplicated: Secondary | ICD-10-CM | POA: Diagnosis not present

## 2016-11-29 DIAGNOSIS — Z7901 Long term (current) use of anticoagulants: Secondary | ICD-10-CM

## 2016-11-29 DIAGNOSIS — M069 Rheumatoid arthritis, unspecified: Secondary | ICD-10-CM | POA: Diagnosis not present

## 2016-11-29 DIAGNOSIS — R000 Tachycardia, unspecified: Secondary | ICD-10-CM | POA: Diagnosis present

## 2016-11-29 DIAGNOSIS — L03116 Cellulitis of left lower limb: Secondary | ICD-10-CM

## 2016-11-29 DIAGNOSIS — G8929 Other chronic pain: Secondary | ICD-10-CM | POA: Diagnosis present

## 2016-11-29 DIAGNOSIS — S86812A Strain of other muscle(s) and tendon(s) at lower leg level, left leg, initial encounter: Secondary | ICD-10-CM | POA: Diagnosis not present

## 2016-11-29 DIAGNOSIS — I5033 Acute on chronic diastolic (congestive) heart failure: Secondary | ICD-10-CM | POA: Diagnosis not present

## 2016-11-29 DIAGNOSIS — F1410 Cocaine abuse, uncomplicated: Secondary | ICD-10-CM | POA: Diagnosis not present

## 2016-11-29 DIAGNOSIS — M79605 Pain in left leg: Secondary | ICD-10-CM | POA: Diagnosis not present

## 2016-11-29 DIAGNOSIS — M1612 Unilateral primary osteoarthritis, left hip: Secondary | ICD-10-CM | POA: Diagnosis not present

## 2016-11-29 DIAGNOSIS — M25562 Pain in left knee: Secondary | ICD-10-CM | POA: Diagnosis present

## 2016-11-29 DIAGNOSIS — Z720 Tobacco use: Secondary | ICD-10-CM | POA: Diagnosis present

## 2016-11-29 DIAGNOSIS — M25552 Pain in left hip: Secondary | ICD-10-CM | POA: Diagnosis not present

## 2016-11-29 DIAGNOSIS — M25549 Pain in joints of unspecified hand: Secondary | ICD-10-CM | POA: Diagnosis not present

## 2016-11-29 DIAGNOSIS — L0390 Cellulitis, unspecified: Secondary | ICD-10-CM

## 2016-11-29 DIAGNOSIS — M25462 Effusion, left knee: Secondary | ICD-10-CM | POA: Diagnosis not present

## 2016-11-29 DIAGNOSIS — W57XXXA Bitten or stung by nonvenomous insect and other nonvenomous arthropods, initial encounter: Secondary | ICD-10-CM | POA: Diagnosis present

## 2016-11-29 DIAGNOSIS — R52 Pain, unspecified: Secondary | ICD-10-CM | POA: Diagnosis not present

## 2016-11-29 DIAGNOSIS — Z8673 Personal history of transient ischemic attack (TIA), and cerebral infarction without residual deficits: Secondary | ICD-10-CM

## 2016-11-29 DIAGNOSIS — Z6835 Body mass index (BMI) 35.0-35.9, adult: Secondary | ICD-10-CM

## 2016-11-29 DIAGNOSIS — M1380 Other specified arthritis, unspecified site: Secondary | ICD-10-CM

## 2016-11-29 HISTORY — DX: Gout, unspecified: M10.9

## 2016-11-29 HISTORY — DX: Sepsis, unspecified organism: A41.9

## 2016-11-29 HISTORY — DX: Cellulitis of left lower limb: L03.116

## 2016-11-29 HISTORY — DX: Pneumonia, unspecified organism: J18.9

## 2016-11-29 LAB — BASIC METABOLIC PANEL
Anion gap: 10 (ref 5–15)
Anion gap: 10 (ref 5–15)
BUN: 22 mg/dL — ABNORMAL HIGH (ref 6–20)
BUN: 22 mg/dL — ABNORMAL HIGH (ref 6–20)
CHLORIDE: 103 mmol/L (ref 101–111)
CO2: 26 mmol/L (ref 22–32)
CO2: 25 mmol/L (ref 22–32)
Calcium: 8.3 mg/dL — ABNORMAL LOW (ref 8.9–10.3)
Calcium: 8.2 mg/dL — ABNORMAL LOW (ref 8.9–10.3)
Chloride: 101 mmol/L (ref 101–111)
Creatinine, Ser: 1.03 mg/dL (ref 0.61–1.24)
Creatinine, Ser: 0.92 mg/dL (ref 0.61–1.24)
GFR calc Af Amer: >60 mL/min (ref >60)
GFR calc Af Amer: >60 mL/min (ref >60)
GFR calc non Af Amer: >60 mL/min (ref >60)
GFR calc non Af Amer: >60 mL/min (ref >60)
Glucose, Bld: 119 mg/dL — ABNORMAL HIGH (ref 65–99)
Glucose, Bld: 132 mg/dL — ABNORMAL HIGH (ref 65–99)
POTASSIUM: 4.5 mmol/L (ref 3.5–5.1)
Potassium: 6.2 mmol/L — ABNORMAL HIGH (ref 3.5–5.1)
SODIUM: 138 mmol/L (ref 135–145)
Sodium: 137 mmol/L (ref 135–145)

## 2016-11-29 LAB — I-STAT CG4 LACTIC ACID, ED: Lactic Acid, Venous: 1.86 mmol/L (ref 0.5–1.9)

## 2016-11-29 LAB — CBC WITH DIFFERENTIAL/PLATELET
Basophils Absolute: 0.1 10*3/uL (ref 0.0–0.1)
Basophils Relative: 0 %
Eosinophils Absolute: 0.3 10*3/uL (ref 0.0–0.7)
Eosinophils Relative: 2 %
HCT: 34 % — ABNORMAL LOW (ref 39.0–52.0)
Hemoglobin: 10.5 g/dL — ABNORMAL LOW (ref 13.0–17.0)
Lymphocytes Relative: 5 %
Lymphs Abs: 0.9 10*3/uL (ref 0.7–4.0)
MCH: 26.2 pg (ref 26.0–34.0)
MCHC: 30.9 g/dL (ref 30.0–36.0)
MCV: 84.8 fL (ref 78.0–100.0)
Monocytes Absolute: 1.4 10*3/uL — ABNORMAL HIGH (ref 0.1–1.0)
Monocytes Relative: 7 %
Neutro Abs: 16.9 10*3/uL — ABNORMAL HIGH (ref 1.7–7.7)
Neutrophils Relative %: 86 %
Platelets: 468 10*3/uL — ABNORMAL HIGH (ref 150–400)
RBC: 4.01 MIL/uL — ABNORMAL LOW (ref 4.22–5.81)
RDW: 14.4 % (ref 11.5–15.5)
WBC: 19.7 10*3/uL — ABNORMAL HIGH (ref 4.0–10.5)

## 2016-11-29 LAB — SEDIMENTATION RATE: SED RATE: 80 mm/h — AB (ref 0–16)

## 2016-11-29 LAB — APTT: APTT: 40 s — AB (ref 24–36)

## 2016-11-29 LAB — PROTIME-INR
INR: 1.65
PROTHROMBIN TIME: 19.7 s — AB (ref 11.4–15.2)

## 2016-11-29 MED ORDER — DILTIAZEM HCL 100 MG IV SOLR
5.0000 mg/h | Freq: Once | INTRAVENOUS | Status: AC
  Administered 2016-11-30: 15 mg/h via INTRAVENOUS
  Filled 2016-11-29: qty 100

## 2016-11-29 MED ORDER — RIVAROXABAN 20 MG PO TABS
20.0000 mg | ORAL_TABLET | Freq: Every day | ORAL | Status: DC
  Administered 2016-11-30 – 2016-12-04 (×5): 20 mg via ORAL
  Filled 2016-11-29 (×6): qty 1

## 2016-11-29 MED ORDER — DILTIAZEM HCL 25 MG/5ML IV SOLN
20.0000 mg | Freq: Once | INTRAVENOUS | Status: AC
  Administered 2016-11-29: 20 mg via INTRAVENOUS
  Filled 2016-11-29: qty 5

## 2016-11-29 MED ORDER — HYDROXYZINE HCL 25 MG PO TABS: 25.0000 mg | ORAL_TABLET | Freq: Every day | ORAL | Status: DC | PRN

## 2016-11-29 MED ORDER — MORPHINE SULFATE (PF) 4 MG/ML IV SOLN
4.0000 mg | Freq: Once | INTRAVENOUS | Status: AC
  Administered 2016-11-29: 4 mg via INTRAVENOUS
  Filled 2016-11-29: qty 1

## 2016-11-29 MED ORDER — DOCUSATE CALCIUM 240 MG PO CAPS: 240.0000 mg | ORAL_CAPSULE | Freq: Every day | ORAL | Status: DC

## 2016-11-29 MED ORDER — METOPROLOL SUCCINATE ER 25 MG PO TB24
25.0000 mg | ORAL_TABLET | Freq: Every day | ORAL | Status: DC
  Administered 2016-11-30 – 2016-12-04 (×5): 25 mg via ORAL
  Filled 2016-11-29 (×5): qty 1

## 2016-11-29 MED ORDER — OXYCODONE-ACETAMINOPHEN 5-325 MG PO TABS
2.0000 | ORAL_TABLET | Freq: Four times a day (QID) | ORAL | Status: DC | PRN
  Administered 2016-11-30 (×2): 2 via ORAL
  Filled 2016-11-29 (×2): qty 2

## 2016-11-29 MED ORDER — ACETAMINOPHEN 325 MG PO TABS
650.0000 mg | ORAL_TABLET | Freq: Four times a day (QID) | ORAL | Status: DC | PRN
  Administered 2016-11-30 (×2): 650 mg via ORAL
  Filled 2016-11-29 (×2): qty 2

## 2016-11-29 MED ORDER — ALPRAZOLAM 0.5 MG PO TABS
0.5000 mg | ORAL_TABLET | Freq: Three times a day (TID) | ORAL | Status: DC | PRN
  Administered 2016-11-30 – 2016-12-04 (×4): 0.5 mg via ORAL
  Filled 2016-11-29 (×5): qty 1

## 2016-11-29 MED ORDER — SODIUM CHLORIDE 0.9% FLUSH
3.0000 mL | Freq: Two times a day (BID) | INTRAVENOUS | Status: DC
  Administered 2016-11-30 – 2016-12-04 (×9): 3 mL via INTRAVENOUS

## 2016-11-29 MED ORDER — HYDROMORPHONE HCL 1 MG/ML IJ SOLN: 1.0000 mg | INTRAMUSCULAR | Status: DC | PRN

## 2016-11-29 MED ORDER — LORAZEPAM 2 MG/ML IJ SOLN
1.0000 mg | Freq: Once | INTRAMUSCULAR | Status: DC
Start: 2016-11-30 — End: 2016-12-02

## 2016-11-29 MED ORDER — SODIUM CHLORIDE 0.9 % IV BOLUS (SEPSIS)
1000.0000 mL | Freq: Once | INTRAVENOUS | Status: AC
  Administered 2016-11-29: 1000 mL via INTRAVENOUS

## 2016-11-29 MED ORDER — DILTIAZEM HCL 100 MG IV SOLR
5.0000 mg/h | Freq: Once | INTRAVENOUS | Status: AC
  Administered 2016-11-29: 5 mg/h via INTRAVENOUS
  Filled 2016-11-29: qty 100

## 2016-11-29 MED ORDER — ONDANSETRON HCL 4 MG/2ML IJ SOLN: 4.0000 mg | Freq: Three times a day (TID) | INTRAMUSCULAR | Status: DC | PRN

## 2016-11-29 MED ORDER — NICOTINE 21 MG/24HR TD PT24
21.0000 mg | MEDICATED_PATCH | Freq: Every day | TRANSDERMAL | Status: DC
Start: 2016-11-30 — End: 2016-12-04
  Administered 2016-11-30: 21 mg via TRANSDERMAL
  Filled 2016-11-29 (×5): qty 1

## 2016-11-29 MED ORDER — PNEUMOCOCCAL VAC POLYVALENT 25 MCG/0.5ML IJ INJ
0.5000 mL | INJECTION | INTRAMUSCULAR | Status: DC
Start: 2016-11-30 — End: 2016-12-04

## 2016-11-29 MED ORDER — HYDROMORPHONE HCL 1 MG/ML IJ SOLN
1.0000 mg | Freq: Once | INTRAMUSCULAR | Status: AC
  Administered 2016-11-29: 1 mg via INTRAVENOUS
  Filled 2016-11-29: qty 1

## 2016-11-29 MED ORDER — VANCOMYCIN HCL IN DEXTROSE 1-5 GM/200ML-% IV SOLN
1000.0000 mg | Freq: Two times a day (BID) | INTRAVENOUS | Status: DC
  Administered 2016-11-29 – 2016-12-01 (×4): 1000 mg via INTRAVENOUS
  Filled 2016-11-29 (×4): qty 200

## 2016-11-29 MED ORDER — DOCUSATE SODIUM 100 MG PO CAPS
200.0000 mg | ORAL_CAPSULE | Freq: Every day | ORAL | Status: DC
  Administered 2016-11-30 – 2016-12-04 (×4): 200 mg via ORAL
  Filled 2016-11-29 (×4): qty 2

## 2016-11-29 MED ORDER — AMIODARONE HCL 200 MG PO TABS
200.0000 mg | ORAL_TABLET | Freq: Every day | ORAL | Status: DC
  Administered 2016-11-30 – 2016-12-04 (×5): 200 mg via ORAL
  Filled 2016-11-29 (×6): qty 1

## 2016-11-29 MED ORDER — ZOLPIDEM TARTRATE 5 MG PO TABS
5.0000 mg | ORAL_TABLET | Freq: Every evening | ORAL | Status: DC | PRN
  Administered 2016-12-02 – 2016-12-04 (×2): 5 mg via ORAL
  Filled 2016-11-29 (×2): qty 1

## 2016-11-29 NOTE — ED Triage Notes (Signed)
Pt presents via EMS c/o of joint pain and lower left leg edema. Pt had joint injections on Tuesday. Pt in aflutter and has an ablation scheduled Thursday. 263mcg of fentanyl given

## 2016-11-29 NOTE — ED Provider Notes (Signed)
[facility] DEPT Provider Note   CSN: [id number] Arrival date & time: 11/29/16  1953     History   Chief Complaint Chief Complaint  Patient presents with  . Generalized Body Aches    HPI Brian [name] is a 65 y.o. male with history of atrial flutter, CHF, chronic left knee pain who presents with new left lower leg pain. He reports that he had cortisone injection 6 days ago to his knee. Before that, patient had a joint aspiration which showed inflammatory change per chart review. Patient developed redness and swelling to his left lower calf one day after his cortisone injection. He has had worsening pain over the past few days and was unable to weight-bear today. Patient has an ablation surgery scheduled for his atrial flutter with RVR on Thursday of this week. He states his heart rate is normally significantly elevated, but it is more elevated due to his pain. He presents for his leg pain and not his atrial flutter. Patient also reports bilateral shoulder and wrist pain which all seemed to start around the time his knee started hurting. He denies any fall or injury. He denies any chest pain, shortness of breath, abdominal pain, nausea, vomiting, urinary symptoms. Patient was given 200 g of fentanyl by EMS.  HPI  Past Medical History:  Diagnosis Date  . Anxiety   . Atrial flutter ([place])    a. 08/2012  . CHF (congestive heart failure) ([place])   . Hypertension   . Obesity   . Shortness of breath   . Tobacco abuse     Patient Active Problem List   Diagnosis Date Noted  . Cellulitis of left leg 11/29/2016  . Sepsis ([place]) 11/29/2016  . Effusion, left knee 11/13/2016  . Persistent atrial fibrillation ([place])   . Dyspnea   . Positive urine drug screen   . CAP (community acquired pneumonia) 09/30/2016  . Community acquired pneumonia of left lower lobe of lung ([place])   . Pleural effusion   . Tachypnea   . CVA (cerebral infarction) 09/27/2015  . Right hand pain 09/27/2015  . History  of gout 09/27/2015  . Cocaine abuse 09/27/2015  . TIA (transient ischemic attack) 09/27/2015  . Slurred speech   . Primary osteoarthritis of right hand   . Leukocytosis   . Essential hypertension   . Chronic diastolic CHF (congestive heart failure) ([place])   . HTN (hypertension) 11/22/2013  . Chronic diastolic heart failure ([place]) 11/22/2013  . Noncompliance 11/17/2013  . Atrial flutter with rapid ventricular response ([place]) 11/16/2013  . Acute on chronic diastolic CHF (congestive heart failure) ([place]) 09/05/2012  . Tobacco abuse   . Obesity- BMI 35   . Diastolic CHF ([place])   . Atrial flutter ([place])   . Anxiety     Past Surgical History:  Procedure Laterality Date  . CARDIOVERSION N/A 11/05/2016   Procedure: CARDIOVERSION;  Surgeon: Sueanne Margarita, MD;  Location: [facility];  Service: Cardiovascular;  Laterality: N/A;  . IR THORACENTESIS ASP PLEURAL SPACE W/IMG GUIDE  10/01/2016  . MICROLARYNGOSCOPY  09/02/2007   with excision of right vocal cord mass, Dr. Constance Holster       Home Medications    Prior to Admission medications   Medication Sig Start Date End Date Taking? Authorizing Provider  acetaminophen (TYLENOL) 500 MG tablet Take 1,000 mg by mouth every 6 (six) hours as needed (pain).   Yes [provider]  amiodarone (PACERONE) 200 MG tablet Take 1 tablet (200 mg total) by  mouth daily. 11/12/16  Yes Sherran Needs, NP  Docusate Calcium (STOOL SOFTENER PO) Take 3 capsules by mouth daily.    Yes [provider]  furosemide (LASIX) 40 MG tablet Take 80 mg by mouth 2 (two) times daily.   Yes [provider]  HYDROcodone-acetaminophen (NORCO/VICODIN) 5-325 MG tablet Take 1 tablet by mouth every 4 (four) hours as needed. Patient taking differently: Take 0.5-1 tablets by mouth every 4 (four) hours as needed.  11/23/16  Yes Nona Dell, PA-C  hydrOXYzine (ATARAX/VISTARIL) 25 MG tablet Take 25 mg by mouth daily as needed for anxiety.   Yes [provider]  metoprolol succinate (TOPROL XL) 25 MG 24 hr tablet Take 1 tablet (25 mg total) by mouth daily. 10/23/16  Yes Jerline Pain, MD  potassium chloride SA (K-DUR,KLOR-CON) 20 MEQ tablet Take 1 tablet (20 mEq total) by mouth daily. 10/26/16  Yes Jerline Pain, MD  rivaroxaban (XARELTO) 20 MG TABS tablet Take 1 tablet (20 mg total) by mouth daily with supper. Patient taking differently: Take 20 mg by mouth every morning.  01/17/16  Yes Isaiah Serge, NP    Family History Family History  Problem Relation Age of Onset  . Cancer Mother   . Heart disease Father   . Heart attack Father   . Cancer Father   . Diabetes Sister     Social History Social History  Substance Use Topics  . Smoking status: Current Some Day Smoker    Packs/day: 1.00    Years: 50.00    Types: Cigarettes  . Smokeless tobacco: Never Used     Comment: quit 11/04/12  . Alcohol use Yes     Comment: socially     Allergies   Tape   Review of Systems Review of Systems  Constitutional: Positive for fever (low grade in ED). Negative for chills.  HENT: Negative for facial swelling and sore throat.   Respiratory: Negative for shortness of breath.   Cardiovascular: Negative for chest pain.  Gastrointestinal: Negative for abdominal pain, nausea and vomiting.  Genitourinary: Negative for dysuria.  Musculoskeletal: Positive for arthralgias and joint swelling. Negative for back pain.  Skin: Positive for color change. Negative for rash and wound.  Neurological: Negative for headaches.  Psychiatric/Behavioral: The patient is not nervous/anxious.      Physical Exam Updated Vital Signs BP 102/84   Pulse (!) 141   Temp 100 F (37.8 C) (Oral)   Resp (!) 25   SpO2 (!) 84%   Physical Exam  Constitutional: He appears well-developed and well-nourished. No distress.  HENT:  Head: Normocephalic and atraumatic.  Mouth/Throat: Oropharynx is clear and moist. No oropharyngeal exudate.  Eyes: Conjunctivae are  normal. Pupils are equal, round, and reactive to light. Right eye exhibits no discharge. Left eye exhibits no discharge. No scleral icterus.  Neck: Normal range of motion. Neck supple. No thyromegaly present.  Cardiovascular: Normal heart sounds and intact distal pulses.  An irregular rhythm present. Tachycardia present.  Exam reveals no gallop and no friction rub.   No murmur heard. Pulmonary/Chest: Effort normal and breath sounds normal. No stridor. No respiratory distress. He has no wheezes. He has no rales.  Abdominal: Soft. Bowel sounds are normal. He exhibits no distension. There is no tenderness. There is no rebound and no guarding.  Musculoskeletal: He exhibits no edema.  Significant tenderness to left calf, skin is full and tight, warmth noted to area of erythema, see photo; no pain with passive  range of motion to the knee, joint effusion noted to superior anterior L knee.  Lymphadenopathy:    He has no cervical adenopathy.  Neurological: He is alert. Coordination normal.  Skin: Skin is warm and dry. No rash noted. He is not diaphoretic. No pallor.  Psychiatric: He has a normal mood and affect.  Nursing note and vitals reviewed.      ED Treatments / Results  Labs (all labs ordered are listed, but only abnormal results are displayed) Labs Reviewed  CBC WITH DIFFERENTIAL/PLATELET - Abnormal; Notable for the following:       Result Value   WBC 19.7 (*)    RBC 4.01 (*)    Hemoglobin 10.5 (*)    HCT 34.0 (*)    Platelets 468 (*)    Neutro Abs 16.9 (*)    Monocytes Absolute 1.4 (*)    All other components within normal limits  BASIC METABOLIC PANEL - Abnormal; Notable for the following:    Potassium 6.2 (*)    Glucose, Bld 119 (*)    BUN 22 (*)    Calcium 8.3 (*)    All other components within normal limits  BASIC METABOLIC PANEL - Abnormal; Notable for the following:    Glucose, Bld 132 (*)    BUN 22 (*)    Calcium 8.2 (*)    All other components within normal limits    PROTIME-INR - Abnormal; Notable for the following:    Prothrombin Time 19.7 (*)    All other components within normal limits  APTT - Abnormal; Notable for the following:    aPTT 40 (*)    All other components within normal limits  CULTURE, BLOOD (ROUTINE X 2)  CULTURE, BLOOD (ROUTINE X 2)  MRSA PCR SCREENING  SEDIMENTATION RATE  C-REACTIVE PROTEIN  LACTIC ACID, PLASMA  LACTIC ACID, PLASMA  PROCALCITONIN  RAPID URINE DRUG SCREEN, HOSP PERFORMED  BASIC METABOLIC PANEL  BRAIN NATRIURETIC PEPTIDE  CBC  I-STAT CG4 LACTIC ACID, ED    EKG  EKG Interpretation  Date/Time:  Sunday November 29 2016 19:55:28 EDT Ventricular Rate:  140 PR Interval:    QRS Duration: 109 QT Interval:  289 QTC Calculation: 441 R Axis:   103 Text Interpretation:  Right and left arm electrode reversal, interpretation assumes no reversal Atrial flutter with predominant 2:1 AV block Inferior infarct, acute (RCA) Probable RV involvement, suggest recording right precordial leads No significant change since last tracing Confirmed by Isla Pence [phone]) on 11/29/2016 8:15:58 PM       Radiology No results found.  Procedures Procedures (including critical care time)  Medications Ordered in ED Medications  vancomycin (VANCOCIN) IVPB 1000 mg/200 mL premix (1,000 mg Intravenous New Bag/Given 11/29/16 2242)  diltiazem (CARDIZEM) 100 mg in dextrose 5 % 100 mL (1 mg/mL) infusion (7.5 mg/hr Intravenous Rate/Dose Change 11/29/16 2240)  HYDROmorphone (DILAUDID) injection 1 mg (not administered)  acetaminophen (TYLENOL) tablet 650 mg (not administered)  amiodarone (PACERONE) tablet 200 mg (not administered)  hydrOXYzine (ATARAX/VISTARIL) tablet 25 mg (not administered)  metoprolol succinate (TOPROL-XL) 24 hr tablet 25 mg (not administered)  rivaroxaban (XARELTO) tablet 20 mg (not administered)  oxyCODONE-acetaminophen (PERCOCET/ROXICET) 5-325 MG per tablet 2 tablet (not administered)  ondansetron (ZOFRAN) injection 4 mg  (not administered)  sodium chloride flush (NS) 0.9 % injection 3 mL (not administered)  zolpidem (AMBIEN) tablet 5 mg (not administered)  nicotine (NICODERM CQ - dosed in mg/24 hours) patch 21 mg (not administered)  docusate sodium (COLACE) capsule 200 mg (  not administered)  diltiazem (CARDIZEM) injection 20 mg (20 mg Intravenous Given 11/29/16 2041)  morphine 4 MG/ML injection 4 mg (4 mg Intravenous Given 11/29/16 2041)  diltiazem (CARDIZEM) 100 mg in dextrose 5 % 100 mL (1 mg/mL) infusion (10 mg/hr Intravenous Rate/Dose Change 11/29/16 2246)  HYDROmorphone (DILAUDID) injection 1 mg (1 mg Intravenous Given 11/29/16 2125)  sodium chloride 0.9 % bolus 1,000 mL (1,000 mLs Intravenous New Bag/Given 11/29/16 2214)     Initial Impression / Assessment and Plan / ED Course  I have reviewed the triage vital signs and the nursing notes.  Pertinent labs & imaging results that were available during my care of the patient were reviewed by me and considered in my medical decision making (see chart for details).     Patient with cellulitis to left lower extremity. Patient also with atrial flutter with RVR at baseline, heart rate in 140s on arrival. He has ablation surgery scheduled for Thursday of this week. WBC 19.7. BMP shows glucose 132, BUN 22. Lactate 1.86. Blood cultures pending. Vancomycin initiated. Cardizem injection 20 mg given at first, however heart rate unresponsive, Cardizem drip initiated. I consulted Dr. Porfirio Mylar with [facility] who will admit the patient for further evaluation and treatment. Patient also evaluated by Dr. Gilford Raid who guided the patient's management and agrees with plan. Patient's oxygen saturations were above 90% in the ED.  Final Clinical Impressions(s) / ED Diagnoses   Final diagnoses:  Cellulitis of left leg  Atrial flutter, unspecified type [facility])    New Prescriptions Current Discharge Medication List       Caryl Ada 11/29/16 2328    Isla Pence,  MD 12/02/16 1535

## 2016-11-29 NOTE — H&P (Signed)
History and Physical    Brian [name] QZE:[id number] DOB: 13-Dec-1950 DOA: 11/29/2016  Referring MD/NP/PA:   PCP: Orpah Melter, MD   Patient coming from:  The patient is coming from home.  At baseline, pt is independent for most of ADL.  Chief Complaint: left lower leg pain  HPI: Brian [name] is a 66 y.o. male with medical history significant of hypertension, anxiety, tobacco abuse, cocaine abuse, dCHF, atrial flutter on Xarelto, cocaine abuse, who presents with left lower leg pain.  Patient states that he has been having left lower leg pain for 4 days. It is constant, 10 out of 10 in severity, nonradiating. The left lower leg is swelling, erythematous posteriorly. Patient denies subjective fever, but his temperature is 180. No chills. Patient states that he has left knee pain and swelling. he left knee joint aspiration on 11/13/16. Synovial fluid analysis showed WBC 5076 and Gram staining negative for organism. He still has left knee pain and mild swelling, but no redness or warmth. He states that he had left knee steroid injection on Tuesday. Patient denies GI symptoms, no symptoms of UTI. No chest pain, SOB, cough, unilateral weakness. He also complains of some left hip pain.  ED Course: pt was found to have WBC 19.7, lactate 1.86, creatinine 1.06, temperature 100, A. Flutter with RVR on EKG, O2 sat 95% on room air. Pt is started with IV Cardizem gtt. Pt is accepted to stepdown as inpatient.   Review of Systems:   General: has fevers, no chills, no changes in body weight, has fatigue HEENT: no blurry vision, hearing changes or sore throat Respiratory: no dyspnea, coughing, wheezing CV: no chest pain, no palpitations GI: no nausea, vomiting, abdominal pain, diarrhea, constipation GU: no dysuria, burning on urination, increased urinary frequency, hematuria  Ext: has left lower leg edema, redness and tenderness. Neuro: no unilateral weakness, numbness, or tingling, no vision change  or hearing loss Skin: no rash, no skin tear. MSK: has left knee, left hip pain. Heme: No easy bruising.  Travel history: No recent long distant travel.  Allergy:  Allergies  Allergen Reactions  . Tape Rash    Paper tape is preferred, please!!    Past Medical History:  Diagnosis Date  . Anxiety   . Atrial flutter ([place])    a. 08/2012  . CHF (congestive heart failure) ([place])   . Hypertension   . Obesity   . Shortness of breath   . Tobacco abuse     Past Surgical History:  Procedure Laterality Date  . CARDIOVERSION N/A 11/05/2016   Procedure: CARDIOVERSION;  Surgeon: Sueanne Margarita, MD;  Location: [facility];  Service: Cardiovascular;  Laterality: N/A;  . IR THORACENTESIS ASP PLEURAL SPACE W/IMG GUIDE  10/01/2016  . MICROLARYNGOSCOPY  09/02/2007   with excision of right vocal cord mass, Dr. Constance Holster    Social History:  reports that he has been smoking Cigarettes.  He has a 50.00 pack-year smoking history. He has never used smokeless tobacco. He reports that he drinks alcohol. He reports that he does not use drugs.  Family History:  Family History  Problem Relation Age of Onset  . Cancer Mother   . Heart disease Father   . Heart attack Father   . Cancer Father   . Diabetes Sister      Prior to Admission medications   Medication Sig Start Date End Date Taking? Authorizing Provider  acetaminophen (TYLENOL) 500 MG tablet Take 1,000 mg by mouth every 6 (six)  hours as needed (pain).    [provider]  amiodarone (PACERONE) 200 MG tablet Take 1 tablet (200 mg total) by mouth daily. 11/12/16   Sherran Needs, NP  Docusate Calcium (STOOL SOFTENER PO) Take 2 capsules by mouth 2 (two) times daily.    [provider]  furosemide (LASIX) 40 MG tablet Take 80 mg by mouth 2 (two) times daily.    [provider]  HYDROcodone-acetaminophen (NORCO/VICODIN) 5-325 MG tablet Take 1 tablet by mouth every 4 (four) hours as needed. 11/23/16   Nona Dell,  PA-C  hydrOXYzine (ATARAX/VISTARIL) 25 MG tablet Take 25 mg by mouth daily as needed for anxiety.    [provider]  metoprolol succinate (TOPROL XL) 25 MG 24 hr tablet Take 1 tablet (25 mg total) by mouth daily. 10/23/16   Jerline Pain, MD  potassium chloride SA (K-DUR,KLOR-CON) 20 MEQ tablet Take 1 tablet (20 mEq total) by mouth daily. 10/26/16   Jerline Pain, MD  rivaroxaban (XARELTO) 20 MG TABS tablet Take 1 tablet (20 mg total) by mouth daily with supper. 01/17/16   Isaiah Serge, NP    Physical Exam: Vitals:   11/29/16 2200 11/29/16 2215 11/29/16 2230 11/29/16 2330  BP: (!) 145/85 137/88 102/84 133/90  Pulse: (!) 142 (!) 141 (!) 141   Resp: (!) 27 (!) 22 (!) 25   Temp:    99.1 F (37.3 C)  TempSrc:    Oral  SpO2: 92% 94% (!) 84%   Weight:    107.1 kg (236 lb 1.8 oz)  Height:    _0  (1.778 m)   General: Not in acute distress HEENT:       Eyes: PERRL, EOMI, no scleral icterus.       ENT: No discharge from the ears and nose, no pharynx injection, no tonsillar enlargement.        Neck: No JVD, no bruit, no mass felt. Heme: No neck lymph node enlargement. Cardiac: S1/S2, RRR, No murmurs, No gallops or rubs. Respiratory:  No rales, wheezing, rhonchi or rubs. GI: Soft, nondistended, nontender, no rebound pain, no organomegaly, BS present. GU: No hematuria Ext: 1+DP/PT pulse in the right leg and weak pulse in the left leg. I did portable doppler. His left DP/PT pulses can be easily detected by portable doppler.  The left leg is swelling, warm, tender and erythematous posteriorly.  Musculoskeletal: No joint deformities, No joint redness or warmth, no limitation of ROM in spin. Skin: No rashes.  Neuro: Alert, oriented X3, cranial nerves II-XII grossly intact, moves all extremities normally.  Psych: Patient is not psychotic, no suicidal or hemocidal ideation.  Labs on Admission: I have personally reviewed following labs and imaging studies  CBC:  Recent Labs Lab  11/29/16 2021  WBC 19.7*  NEUTROABS 16.9*  HGB 10.5*  HCT 34.0*  MCV 84.8  PLT 831*   Basic Metabolic Panel:  Recent Labs Lab 11/29/16 2021 11/29/16 2216  NA 137 138  K 6.2* 4.5  CL 101 103  CO2 26 25  GLUCOSE 119* 132*  BUN 22* 22*  CREATININE 1.03 0.92  CALCIUM 8.3* 8.2*   GFR: Estimated Creatinine Clearance: 98.1 mL/min (by C-G formula based on SCr of 0.92 mg/dL). Liver Function Tests: No results for input(s): AST, ALT, ALKPHOS, BILITOT, PROT, ALBUMIN in the last 168 hours. No results for input(s): LIPASE, AMYLASE in the last 168 hours. No results for input(s): AMMONIA in the last 168 hours. Coagulation Profile:  Recent  Labs Lab 11/29/16 2218  INR 1.65   Cardiac Enzymes: No results for input(s): CKTOTAL, CKMB, CKMBINDEX, TROPONINI in the last 168 hours. BNP (last 3 results) No results for input(s): PROBNP in the last 8760 hours. HbA1C: No results for input(s): HGBA1C in the last 72 hours. CBG: No results for input(s): GLUCAP in the last 168 hours. Lipid Profile: No results for input(s): CHOL, HDL, LDLCALC, TRIG, CHOLHDL, LDLDIRECT in the last 72 hours. Thyroid Function Tests: No results for input(s): TSH, T4TOTAL, FREET4, T3FREE, THYROIDAB in the last 72 hours. Anemia Panel: No results for input(s): VITAMINB12, FOLATE, FERRITIN, TIBC, IRON, RETICCTPCT in the last 72 hours. Urine analysis:    Component Value Date/Time   COLORURINE YELLOW 09/27/2015 1050   APPEARANCEUR CLEAR 09/27/2015 1050   LABSPEC >1.046 (H) 09/27/2015 1050   PHURINE 6.0 09/27/2015 1050   GLUCOSEU NEGATIVE 09/27/2015 1050   HGBUR NEGATIVE 09/27/2015 1050   BILIRUBINUR NEGATIVE 09/27/2015 1050   KETONESUR NEGATIVE 09/27/2015 1050   PROTEINUR NEGATIVE 09/27/2015 1050   UROBILINOGEN 0.2 09/05/2012 0839   NITRITE NEGATIVE 09/27/2015 1050   LEUKOCYTESUR NEGATIVE 09/27/2015 1050   Sepsis Labs: _0 (procalcitonin:4,lacticidven:4) )No results found for this or any previous visit  (from the past 240 hour(s)).   Radiological Exams on Admission: No results found.   EKG: Independently reviewed. Atrial flutter with RVR, heart rate up to 140.  Assessment/Plan Principal Problem:   Cellulitis of left leg Active Problems:   Tobacco abuse   Anxiety   Atrial flutter with rapid ventricular response (H[facility])   Essential hypertension   Chronic diastolic CHF (congestive heart failure) (HCC)   Effusion, left knee   Sepsis ([place])   Cellulitis of left leg and sepsis: Patient meets criteria for sepsis with leukocytosis, fever, tachycardia and tachypnea. Lactate is normal. Currently hemodynamically stable. He states that he had left knee steroid injection on Tuesday. He still has left knee pain and mild swelling, but no redness or warmth. Clinically, no septic joint.  - will admit to tele bed as inpt - Empiric antimicrobial treatment with vancomycin  - PRN Zofran for nausea,  Percocet for pain - will avoid IV narcotic given hx of cocaine abuse - Blood cultures x 2  - ESR and CRP - will get Procalcitonin and trend lactic acid levels per sepsis protocol. - IVF: 1.0 L of NS bolus. Will not continue IVF due to normal lactic acid and hx of dCHF - hold lasix - LE doppler to r/o DVT - elevation of left leg  A flutter with RVR: no chest pain. Likely triggered by sepsis. CHA2DS2-VASc Score is 3, needs oral anticoagulation. Patient is on Xarelto at home.  -on IV cardizem gtt -check trop x 3 and TSH -continue Xarelto and metoprolol  Left hip pain:  Unclear etiology, possibly due to arthritis -X-ray of left hip  Tobacco abuse and Alcohol abuse: -Did counseling about importance of quitting smoking -Nicotine patch  Anxiety: -prn Xanax  HTN: -Continue metoprolol -On IV Cardizem -Hold Lasix due to sepsis  Chronic diastolic CHF (congestive heart failure) ([place]): 2-D echo on 09/28/15 showed EF of 50-60 percent. Patient does not have leg edema on the right leg, no JVD. CHF seems to  be compensated. -Hold Lasix due to sepsis -Continue metoprolol - check BNP   DVT ppx: on Xarelto Code Status: Full code Family Communication: Yes, patient's sister at bed side Disposition Plan:  Anticipate discharge back to previous home environment Consults called: none Admission status: SDU/inpation  Date of Service 11/30/2016    Ivor Costa [facility] Pager [phone]  If 7PM-7AM, please contact night-coverage [email or web address] Password [email or web address] 11/30/2016, 12:50 AM

## 2016-11-29 NOTE — Progress Notes (Signed)
Pharmacy Antibiotic Note  Brian [name] is a 66 y.o. male admitted on 11/29/2016 with cellulitis.  Pharmacy has been consulted for Vancomycin  dosing.  Plan: Vancomycin 1 gram iv Q 12 hours Follow up LOT, cultures, Scr     Temp (24hrs), Avg:100 F (37.8 C), Min:100 F (37.8 C), Max:100 F (37.8 C)   Recent Labs Lab 11/29/16 2021 11/29/16 2039  WBC 19.7*  --   CREATININE 1.03  --   LATICACIDVEN  --  1.86    Estimated Creatinine Clearance: 87.1 mL/min (by C-G formula based on SCr of 1.03 mg/dL).    Allergies  Allergen Reactions  . Tape Rash    Paper tape is preferred, please!!    Thank you Anette Guarneri, PharmD [phone] 11/29/2016 9:23 PM

## 2016-11-29 NOTE — ED Notes (Signed)
Delay in lab draw,  MD at bedside. 

## 2016-11-30 ENCOUNTER — Encounter (HOSPITAL_COMMUNITY): Payer: Self-pay

## 2016-11-30 ENCOUNTER — Inpatient Hospital Stay (HOSPITAL_COMMUNITY): Payer: PPO

## 2016-11-30 DIAGNOSIS — L03116 Cellulitis of left lower limb: Secondary | ICD-10-CM

## 2016-11-30 LAB — TROPONIN I
Troponin I: <0.03 ng/mL (ref <0.03)
Troponin I: <0.03 ng/mL (ref <0.03)

## 2016-11-30 LAB — BASIC METABOLIC PANEL
ANION GAP: 10 (ref 5–15)
BUN: 21 mg/dL — ABNORMAL HIGH (ref 6–20)
CALCIUM: 8 mg/dL — AB (ref 8.9–10.3)
CO2: 25 mmol/L (ref 22–32)
Chloride: 101 mmol/L (ref 101–111)
Creatinine, Ser: 0.97 mg/dL (ref 0.61–1.24)
GLUCOSE: 137 mg/dL — AB (ref 65–99)
POTASSIUM: 3.9 mmol/L (ref 3.5–5.1)
Sodium: 136 mmol/L (ref 135–145)

## 2016-11-30 LAB — LACTIC ACID, PLASMA
Lactic Acid, Venous: 1.3 mmol/L (ref 0.5–1.9)
Lactic Acid, Venous: 1.2 mmol/L (ref 0.5–1.9)

## 2016-11-30 LAB — CBC
HCT: 29.4 % — ABNORMAL LOW (ref 39.0–52.0)
HEMOGLOBIN: 9 g/dL — AB (ref 13.0–17.0)
MCH: 26.2 pg (ref 26.0–34.0)
MCHC: 30.6 g/dL (ref 30.0–36.0)
MCV: 85.7 fL (ref 78.0–100.0)
PLATELETS: 420 10*3/uL — AB (ref 150–400)
RBC: 3.43 MIL/uL — AB (ref 4.22–5.81)
RDW: 14.8 % (ref 11.5–15.5)
WBC: 19 10*3/uL — AB (ref 4.0–10.5)

## 2016-11-30 LAB — RAPID URINE DRUG SCREEN, HOSP PERFORMED
AMPHETAMINES: NOT DETECTED
BENZODIAZEPINES: NOT DETECTED
Barbiturates: NOT DETECTED
Cocaine: POSITIVE — AB
Opiates: POSITIVE — AB
TETRAHYDROCANNABINOL: NOT DETECTED

## 2016-11-30 LAB — GLUCOSE, CAPILLARY
Glucose-Capillary: 133 mg/dL — ABNORMAL HIGH (ref 65–99)
Glucose-Capillary: 139 mg/dL — ABNORMAL HIGH (ref 65–99)

## 2016-11-30 LAB — MRSA PCR SCREENING: MRSA BY PCR: NEGATIVE

## 2016-11-30 LAB — BRAIN NATRIURETIC PEPTIDE: B NATRIURETIC PEPTIDE 5: 149.6 pg/mL — AB (ref 0.0–100.0)

## 2016-11-30 LAB — PROCALCITONIN

## 2016-11-30 LAB — CK: Total CK: 35 U/L — ABNORMAL LOW (ref 49–397)

## 2016-11-30 LAB — C-REACTIVE PROTEIN: CRP: 7.3 mg/dL — ABNORMAL HIGH (ref <1.0)

## 2016-11-30 LAB — TSH: TSH: 1.994 u[IU]/mL (ref 0.350–4.500)

## 2016-11-30 MED ORDER — HYDROCODONE-ACETAMINOPHEN 5-325 MG PO TABS
1.0000 | ORAL_TABLET | Freq: Four times a day (QID) | ORAL | Status: DC | PRN
  Administered 2016-11-30 – 2016-12-01 (×4): 2 via ORAL
  Administered 2016-12-01 – 2016-12-02 (×2): 1 via ORAL
  Administered 2016-12-03 – 2016-12-04 (×6): 2 via ORAL
  Filled 2016-11-30 (×11): qty 2
  Filled 2016-11-30: qty 1
  Filled 2016-11-30 (×2): qty 2

## 2016-11-30 MED ORDER — KETOROLAC TROMETHAMINE 15 MG/ML IJ SOLN
15.0000 mg | Freq: Once | INTRAMUSCULAR | Status: AC
  Administered 2016-11-30: 15 mg via INTRAVENOUS
  Filled 2016-11-30: qty 1

## 2016-11-30 MED ORDER — SODIUM CHLORIDE 0.9 % IV SOLN
INTRAVENOUS | Status: AC
  Administered 2016-11-30: 04:00:00 via INTRAVENOUS

## 2016-11-30 MED ORDER — DILTIAZEM HCL 100 MG IV SOLR: 5.0000 mg/h | INTRAVENOUS | Status: DC

## 2016-11-30 NOTE — Progress Notes (Signed)
PROGRESS NOTE    Brian [name]  WLN:[id number] DOB: [birth year] DOA: 11/29/2016 PCP: Orpah Melter, MD  Brief Narrative:Brian [name] is a 66 y.o. male with medical history significant of hypertension, anxiety, tobacco abuse, cocaine abuse, dCHF, atrial flutter on Xarelto, cocaine abuse, admitted with cellulitis and noted to have Aflutter with RVR  Assessment & Plan:   LLE Cellulitis with sepsis -clinically improving -sepsis physiology resolved -Continue IV vanc -FU Blood Cx  A flutter with RVR: no chest pain. Likely triggered by sepsis.  -CHA2DS2-VASc Score is 3, -continue xarelto -wean off cardizem gtt -Toprol XL this am  Left hip pain:   -possibly due to arthritis -X-ray of left hip unremarkable  Tobacco abuse and Alcohol abuse: -counseled, Nicotine patch  Anxiety: -prn Xanax  HTN: -Continue metoprolol  Chronic diastolic CHF (congestive heart failure) ([place]): 2-D echo on 09/28/15 showed EF of 50-60 percent. -compensated -continue toprol, hold lasix today  DVT ppx: on Xarelto Code Status: Full code Family Communication: none at bedside Disposition Plan:  home in 1-2days  Consultants:    Antimicrobials:   Vancomcyin   Subjective: Feels better  Objective: Vitals:   11/30/16 0700 11/30/16 0800 11/30/16 0822 11/30/16 0900  BP: 103/87 120/63  (!) 122/59  Pulse: 91 69  (!) 103  Resp: (!) 23 (!) 22  20  Temp:   98.3 F (36.8 C)   TempSrc:   Oral   SpO2: 96% 97%  95%  Weight:      Height:        Intake/Output Summary (Last 24 hours) at 11/30/16 1038 Last data filed at 11/30/16 0828  Gross per 24 hour  Intake           617.67 ml  Output              475 ml  Net           142.67 ml   Filed Weights   11/29/16 2330 11/30/16 0300  Weight: 107.1 kg (236 lb 1.8 oz) 106.9 kg (235 lb 10.8 oz)    Examination:  General exam: AAOx3 Respiratory system: Clear to auscultation. Respiratory effort normal. Cardiovascular system: S1 & S2 heard,  Irregular No JVD, murmurs, rubs, gallops or clicks.  Gastrointestinal system: Abdomen is nondistended, soft and nontender. Normal bowel sounds heard. Central nervous system: Alert and oriented. No focal neurological deficits. Extremities: erythema, swelling of LLE Skin: No rashes, lesions or ulcers Psychiatry: Judgement and insight appear normal. Mood & affect appropriate.     Data Reviewed:   CBC:  Recent Labs Lab 11/29/16 2021 11/30/16 0547  WBC 19.7* 19.0*  NEUTROABS 16.9*  --   HGB 10.5* 9.0*  HCT 34.0* 29.4*  MCV 84.8 85.7  PLT 468* 740*   Basic Metabolic Panel:  Recent Labs Lab 11/29/16 2021 11/29/16 2216 11/30/16 0547  NA 137 138 136  K 6.2* 4.5 3.9  CL 101 103 101  CO2 26 25 25   GLUCOSE 119* 132* 137*  BUN 22* 22* 21*  CREATININE 1.03 0.92 0.97  CALCIUM 8.3* 8.2* 8.0*   GFR: Estimated Creatinine Clearance: 93 mL/min (by C-G formula based on SCr of 0.97 mg/dL). Liver Function Tests: No results for input(s): AST, ALT, ALKPHOS, BILITOT, PROT, ALBUMIN in the last 168 hours. No results for input(s): LIPASE, AMYLASE in the last 168 hours. No results for input(s): AMMONIA in the last 168 hours. Coagulation Profile:  Recent Labs Lab 11/29/16 2218  INR 1.65   Cardiac Enzymes:  Recent Labs Lab  11/30/16 0023 11/30/16 0547  CKTOTAL 35*  --   TROPONINI <0.03 <0.03   BNP (last 3 results) No results for input(s): PROBNP in the last 8760 hours. HbA1C: No results for input(s): HGBA1C in the last 72 hours. CBG:  Recent Labs Lab 11/30/16 0726  GLUCAP 133*   Lipid Profile: No results for input(s): CHOL, HDL, LDLCALC, TRIG, CHOLHDL, LDLDIRECT in the last 72 hours. Thyroid Function Tests:  Recent Labs  11/30/16 0023  TSH 1.994   Anemia Panel: No results for input(s): VITAMINB12, FOLATE, FERRITIN, TIBC, IRON, RETICCTPCT in the last 72 hours. Urine analysis:    Component Value Date/Time   COLORURINE YELLOW 09/27/2015 1050   APPEARANCEUR CLEAR  09/27/2015 1050   LABSPEC >1.046 (H) 09/27/2015 1050   PHURINE 6.0 09/27/2015 1050   GLUCOSEU NEGATIVE 09/27/2015 1050   HGBUR NEGATIVE 09/27/2015 1050   BILIRUBINUR NEGATIVE 09/27/2015 1050   KETONESUR NEGATIVE 09/27/2015 1050   PROTEINUR NEGATIVE 09/27/2015 1050   UROBILINOGEN 0.2 09/05/2012 0839   NITRITE NEGATIVE 09/27/2015 1050   LEUKOCYTESUR NEGATIVE 09/27/2015 1050   Sepsis Labs: @LABRCNTIP (procalcitonin:4,lacticidven:4)  ) Recent Results (from the past 240 hour(s))  MRSA PCR Screening     Status: None   Collection Time: 11/30/16 12:09 AM  Result Value Ref Range Status   MRSA by PCR NEGATIVE NEGATIVE Final    Comment:        The GeneXpert MRSA Assay (FDA approved for NASAL specimens only), is one component of a comprehensive MRSA colonization surveillance program. It is not intended to diagnose MRSA infection nor to guide or monitor treatment for MRSA infections.          Radiology Studies: Dg Hip Unilat With Pelvis 2-3 Views Left  Result Date: 11/30/2016 CLINICAL DATA:  Pain and fever EXAM: DG HIP (WITH OR WITHOUT PELVIS) 2-3V LEFT COMPARISON:  None. FINDINGS: Frontal pelvis as well as frontal and lateral left hip images were obtained. There is no fracture or dislocation. There is mild symmetric narrowing of both hip joints. No erosive change. IMPRESSION: Mild symmetric narrowing of both hip joints. No fracture or dislocation. Electronically Signed   By: Lowella Grip III M.D.   On: 11/30/2016 08:01        Scheduled Meds: . amiodarone  200 mg Oral Daily  . docusate sodium  200 mg Oral Daily  . LORazepam  1 mg Intravenous Once  . metoprolol succinate  25 mg Oral Daily  . nicotine  21 mg Transdermal Daily  . pneumococcal 23 valent vaccine  0.5 mL Intramuscular Tomorrow-1000  . rivaroxaban  20 mg Oral Q breakfast  . sodium chloride flush  3 mL Intravenous Q12H   Continuous Infusions: . sodium chloride 75 mL/hr at 11/30/16 0401  . diltiazem  (CARDIZEM) infusion 5 mg/hr (11/30/16 0657)  . vancomycin 1,000 mg (11/30/16 0919)     LOS: 1 day    Time spent: 88min    Domenic Polite, MD [facility] Pager [phone]  If 7PM-7AM, please contact night-coverage [email or web address] Password [email or web address] 11/30/2016, 10:38 AM

## 2016-11-30 NOTE — Progress Notes (Signed)
VASCULAR LAB PRELIMINARY  PRELIMINARY  PRELIMINARY  PRELIMINARY  Left lower extremity venous duplex completed.    Preliminary report:  There is no DVT or SVT noted in the left lower extremity. Large Baker's cyst noted in the left popliteal fossa.  Heer Justiss, RVT 11/30/2016, 1:13 PM

## 2016-11-30 NOTE — Progress Notes (Signed)
Chaplain presented to the patient's room to complete an advance directive.  The patient's Brian [name] was present at the time of this visit, as well as his sister. The Advance Directive was given to his sister with directions to have the patient inform the staff if he needed addition assistance from [place]. [place] [phone]

## 2016-12-01 DIAGNOSIS — M130 Polyarthritis, unspecified: Secondary | ICD-10-CM | POA: Diagnosis present

## 2016-12-01 LAB — COMPREHENSIVE METABOLIC PANEL
ALBUMIN: 2.5 g/dL — AB (ref 3.5–5.0)
ALK PHOS: 57 U/L (ref 38–126)
ALT: 16 U/L — AB (ref 17–63)
AST: 17 U/L (ref 15–41)
Anion gap: 5 (ref 5–15)
BUN: 13 mg/dL (ref 6–20)
CALCIUM: 8.1 mg/dL — AB (ref 8.9–10.3)
CO2: 27 mmol/L (ref 22–32)
CREATININE: 0.66 mg/dL (ref 0.61–1.24)
Chloride: 99 mmol/L — ABNORMAL LOW (ref 101–111)
GFR calc Af Amer: >60 mL/min (ref >60)
GFR calc non Af Amer: >60 mL/min (ref >60)
GLUCOSE: 143 mg/dL — AB (ref 65–99)
Potassium: 4.4 mmol/L (ref 3.5–5.1)
SODIUM: 131 mmol/L — AB (ref 135–145)
Total Bilirubin: 0.6 mg/dL (ref 0.3–1.2)
Total Protein: 6.5 g/dL (ref 6.5–8.1)

## 2016-12-01 LAB — CBC
HCT: 28.3 % — ABNORMAL LOW (ref 39.0–52.0)
HEMOGLOBIN: 8.5 g/dL — AB (ref 13.0–17.0)
MCH: 25.5 pg — ABNORMAL LOW (ref 26.0–34.0)
MCHC: 30 g/dL (ref 30.0–36.0)
MCV: 85 fL (ref 78.0–100.0)
Platelets: 345 10*3/uL (ref 150–400)
RBC: 3.33 MIL/uL — AB (ref 4.22–5.81)
RDW: 14.4 % (ref 11.5–15.5)
WBC: 15.3 10*3/uL — ABNORMAL HIGH (ref 4.0–10.5)

## 2016-12-01 LAB — GLUCOSE, CAPILLARY: Glucose-Capillary: 101 mg/dL — ABNORMAL HIGH (ref 65–99)

## 2016-12-01 LAB — URIC ACID: Uric Acid, Serum: 4.7 mg/dL (ref 4.4–7.6)

## 2016-12-01 MED ORDER — FAMOTIDINE 20 MG PO TABS
40.0000 mg | ORAL_TABLET | Freq: Two times a day (BID) | ORAL | Status: DC
  Administered 2016-12-01 – 2016-12-04 (×7): 40 mg via ORAL
  Filled 2016-12-01 (×7): qty 2

## 2016-12-01 MED ORDER — NAPROXEN 250 MG PO TABS
500.0000 mg | ORAL_TABLET | Freq: Two times a day (BID) | ORAL | Status: DC
  Administered 2016-12-01 – 2016-12-02 (×3): 500 mg via ORAL
  Filled 2016-12-01 (×3): qty 2

## 2016-12-01 MED ORDER — NAPROXEN 375 MG PO TABS
375.0000 mg | ORAL_TABLET | Freq: Two times a day (BID) | ORAL | Status: DC
Start: 2016-12-01 — End: 2016-12-01
  Filled 2016-12-01: qty 1

## 2016-12-01 MED ORDER — DOXYCYCLINE HYCLATE 100 MG IV SOLR
100.0000 mg | Freq: Two times a day (BID) | INTRAVENOUS | Status: DC
  Administered 2016-12-01 – 2016-12-02 (×4): 100 mg via INTRAVENOUS
  Filled 2016-12-01 (×5): qty 100

## 2016-12-01 NOTE — Progress Notes (Addendum)
PROGRESS NOTE    HOYLE [name]  SAY:[id number] DOB: [birth year] DOA: 11/29/2016 PCP: Orpah Melter, MD  Brief Narrative:Brian [name] is a 66 y.o. male with medical history significant of hypertension, anxiety, tobacco abuse, cocaine abuse, dCHF, atrial flutter on Xarelto, cocaine abuse, admitted with cellulitis and noted to have Aflutter with RVR. Pt was previously scheduled for AFlutter ablation on thrusday, now postponed Now with polyarthritis of small joints of hand, wrists, elbow, knee, ankle, hip etc  Assessment & Plan:   LLE Cellulitis with sepsis -clinically improving well, redness/erythema much improved -sepsis physiology resolved -Change IV Vanc to Doxy, to cover empirically for Lymes -Blood Cx-NGTD  Acute Polyarthritis -since yesterday, has chronic L knee issues but since yesterday having pain and tenderness in most joints esp small joints of hand/elbow/wrist/shoulder/ankle/great toe -Unclear etiology, suspect possible Gout vs tick borne illness, vs from sepsis -check uric acid, check Lyme and RMSF ab-H/o TIck Bite 4weeks ago -change Vanc to Doxycycline to empirically cover for tick borne illness pending serology -add naproxen, incase this is gout, monitor clinical response and await serologfy   A flutter with RVR: no chest pain. Likely triggered by sepsis.  -CHA2DS2-VASc Score is 3, -continue xarelto -weaned off cardizem gtt -continue TOprol, HR controlled -I called and talked to Dr.Allred about ablation, pt was scheduled to have ablation for Aflutter this Thursday, per Dr.Allred this will be postponed and pt will get a call from the office once reschduled  Tobacco abuse and Alcohol abuse: -counseled, Nicotine patch  Anxiety: -prn Xanax  HTN: -Continue metoprolol  Chronic diastolic CHF (congestive heart failure) ([place]): 2-D echo on 09/28/15 showed EF of 50-60 percent. -compensated -continue toprol, hold lasix , resume in 1-2days  DVT ppx: on  Xarelto Code Status: Full code Family Communication: sister at bedside Disposition Plan:  home in few days pending clinical improvement  Consultants:    Antimicrobials:   Vancomcyin   Subjective: Having pains in all joints, esp hands/wrists, toe etc  Objective: Vitals:   12/01/16 0500 12/01/16 0600 12/01/16 0700 12/01/16 0747  BP: 117/75 (!) 89/67 116/76 126/62  Pulse: (!) 124 89 89   Resp: (!) 25 (!) 22 (!) 23 (!) 26  Temp: 98.2 F (36.8 C)   98.9 F (37.2 C)  TempSrc: Oral   Oral  SpO2: 96% 96% 95% 100%  Weight: 113.3 kg (249 lb 12.5 oz)     Height:        Intake/Output Summary (Last 24 hours) at 12/01/16 1037 Last data filed at 12/01/16 0900  Gross per 24 hour  Intake           1714.5 ml  Output             1300 ml  Net            414.5 ml   Filed Weights   11/29/16 2330 11/30/16 0300 12/01/16 0500  Weight: 107.1 kg (236 lb 1.8 oz) 106.9 kg (235 lb 10.8 oz) 113.3 kg (249 lb 12.5 oz)    Examination:  Awake Alert, Oriented X 3, No new F.N deficits, Normal affect [place].AT,PERRAL Supple Neck,No JVD, No cervical lymphadenopathy appriciated.  Chest: Symmetrical Chest wall movement, Good air movement bilaterally, CTAB CVS: S1S2/Irregular, No Gallops,Rubs or new Murmurs, No Parasternal Heave Abd: +ve B.Sounds, Abd Soft, No tenderness, No organomegaly appriciated, No rebound - guarding or rigidity. synovitis of all joints of arms bilaterally and great toe, knees  LLE erythema and swelling near calf much improved   Data  Reviewed:   CBC:  Recent Labs Lab 11/29/16 2021 11/30/16 0547 12/01/16 0233  WBC 19.7* 19.0* 15.3*  NEUTROABS 16.9*  --   --   HGB 10.5* 9.0* 8.5*  HCT 34.0* 29.4* 28.3*  MCV 84.8 85.7 85.0  PLT 468* 420* 818   Basic Metabolic Panel:  Recent Labs Lab 11/29/16 2021 11/29/16 2216 11/30/16 0547 12/01/16 0233  NA 137 138 136 131*  K 6.2* 4.5 3.9 4.4  CL 101 103 101 99*  CO2 26 25 25 27   GLUCOSE 119* 132* 137* 143*  BUN 22* 22* 21*  13  CREATININE 1.03 0.92 0.97 0.66  CALCIUM 8.3* 8.2* 8.0* 8.1*   GFR: Estimated Creatinine Clearance: 116 mL/min (by C-G formula based on SCr of 0.66 mg/dL). Liver Function Tests:  Recent Labs Lab 12/01/16 0233  AST 17  ALT 16*  ALKPHOS 57  BILITOT 0.6  PROT 6.5  ALBUMIN 2.5*   No results for input(s): LIPASE, AMYLASE in the last 168 hours. No results for input(s): AMMONIA in the last 168 hours. Coagulation Profile:  Recent Labs Lab 11/29/16 2218  INR 1.65   Cardiac Enzymes:  Recent Labs Lab 11/30/16 0023 11/30/16 0547  CKTOTAL 35*  --   TROPONINI <0.03 <0.03   BNP (last 3 results) No results for input(s): PROBNP in the last 8760 hours. HbA1C: No results for input(s): HGBA1C in the last 72 hours. CBG:  Recent Labs Lab 11/30/16 0726 11/30/16 1244 12/01/16 0530  GLUCAP 133* 139* 101*   Lipid Profile: No results for input(s): CHOL, HDL, LDLCALC, TRIG, CHOLHDL, LDLDIRECT in the last 72 hours. Thyroid Function Tests:  Recent Labs  11/30/16 0023  TSH 1.994   Anemia Panel: No results for input(s): VITAMINB12, FOLATE, FERRITIN, TIBC, IRON, RETICCTPCT in the last 72 hours. Urine analysis:    Component Value Date/Time   COLORURINE YELLOW 09/27/2015 1050   APPEARANCEUR CLEAR 09/27/2015 1050   LABSPEC >1.046 (H) 09/27/2015 1050   PHURINE 6.0 09/27/2015 1050   GLUCOSEU NEGATIVE 09/27/2015 1050   HGBUR NEGATIVE 09/27/2015 1050   BILIRUBINUR NEGATIVE 09/27/2015 1050   KETONESUR NEGATIVE 09/27/2015 1050   PROTEINUR NEGATIVE 09/27/2015 1050   UROBILINOGEN 0.2 09/05/2012 0839   NITRITE NEGATIVE 09/27/2015 1050   LEUKOCYTESUR NEGATIVE 09/27/2015 1050   Sepsis Labs: @LABRCNTIP (procalcitonin:4,lacticidven:4)  ) Recent Results (from the past 240 hour(s))  Culture, blood (routine x 2)     Status: None (Preliminary result)   Collection Time: 11/29/16  9:35 PM  Result Value Ref Range Status   Specimen Description BLOOD SITE NOT SPECIFIED  Final   Special  Requests   Final    BOTTLES DRAWN AEROBIC AND ANAEROBIC Blood Culture adequate volume   Culture NO GROWTH < 24 HOURS  Final   Report Status PENDING  Incomplete  Culture, blood (routine x 2)     Status: None (Preliminary result)   Collection Time: 11/29/16  9:35 PM  Result Value Ref Range Status   Specimen Description BLOOD RIGHT HAND  Final   Special Requests   Final    BOTTLES DRAWN AEROBIC AND ANAEROBIC Blood Culture adequate volume   Culture NO GROWTH < 24 HOURS  Final   Report Status PENDING  Incomplete  MRSA PCR Screening     Status: None   Collection Time: 11/30/16 12:09 AM  Result Value Ref Range Status   MRSA by PCR NEGATIVE NEGATIVE Final    Comment:        The GeneXpert MRSA Assay (FDA approved for NASAL  specimens only), is one component of a comprehensive MRSA colonization surveillance program. It is not intended to diagnose MRSA infection nor to guide or monitor treatment for MRSA infections.          Radiology Studies: Dg Hip Unilat With Pelvis 2-3 Views Left  Result Date: 11/30/2016 CLINICAL DATA:  Pain and fever EXAM: DG HIP (WITH OR WITHOUT PELVIS) 2-3V LEFT COMPARISON:  None. FINDINGS: Frontal pelvis as well as frontal and lateral left hip images were obtained. There is no fracture or dislocation. There is mild symmetric narrowing of both hip joints. No erosive change. IMPRESSION: Mild symmetric narrowing of both hip joints. No fracture or dislocation. Electronically Signed   By: Lowella Grip III M.D.   On: 11/30/2016 08:01        Scheduled Meds: . amiodarone  200 mg Oral Daily  . docusate sodium  200 mg Oral Daily  . famotidine  40 mg Oral BID  . LORazepam  1 mg Intravenous Once  . metoprolol succinate  25 mg Oral Daily  . naproxen  500 mg Oral BID WC  . nicotine  21 mg Transdermal Daily  . pneumococcal 23 valent vaccine  0.5 mL Intramuscular Tomorrow-1000  . rivaroxaban  20 mg Oral Q breakfast  . sodium chloride flush  3 mL Intravenous Q12H     Continuous Infusions: . doxycycline (VIBRAMYCIN) IV       LOS: 2 days    Time spent: 21min    Domenic Polite, MD [facility] Pager [phone]  If 7PM-7AM, please contact night-coverage [email or web address] Password [email or web address] 12/01/2016, 10:37 AM

## 2016-12-02 ENCOUNTER — Inpatient Hospital Stay (HOSPITAL_COMMUNITY): Payer: PPO

## 2016-12-02 LAB — BASIC METABOLIC PANEL
ANION GAP: 10 (ref 5–15)
BUN: 16 mg/dL (ref 6–20)
CHLORIDE: 97 mmol/L — AB (ref 101–111)
CO2: 24 mmol/L (ref 22–32)
Calcium: 8.5 mg/dL — ABNORMAL LOW (ref 8.9–10.3)
Creatinine, Ser: 0.76 mg/dL (ref 0.61–1.24)
GFR calc Af Amer: >60 mL/min (ref >60)
GFR calc non Af Amer: >60 mL/min (ref >60)
Glucose, Bld: 150 mg/dL — ABNORMAL HIGH (ref 65–99)
Potassium: 4.8 mmol/L (ref 3.5–5.1)
SODIUM: 131 mmol/L — AB (ref 135–145)

## 2016-12-02 LAB — CBC
HCT: 29 % — ABNORMAL LOW (ref 39.0–52.0)
HEMOGLOBIN: 8.8 g/dL — AB (ref 13.0–17.0)
MCH: 25.7 pg — AB (ref 26.0–34.0)
MCHC: 30.3 g/dL (ref 30.0–36.0)
MCV: 84.5 fL (ref 78.0–100.0)
Platelets: 385 10*3/uL (ref 150–400)
RBC: 3.43 MIL/uL — AB (ref 4.22–5.81)
RDW: 14.1 % (ref 11.5–15.5)
WBC: 16.1 10*3/uL — ABNORMAL HIGH (ref 4.0–10.5)

## 2016-12-02 LAB — B. BURGDORFI ANTIBODIES

## 2016-12-02 LAB — GLUCOSE, CAPILLARY: GLUCOSE-CAPILLARY: 122 mg/dL — AB (ref 65–99)

## 2016-12-02 MED ORDER — FUROSEMIDE 10 MG/ML IJ SOLN
40.0000 mg | Freq: Every day | INTRAMUSCULAR | Status: DC
  Administered 2016-12-02 – 2016-12-03 (×2): 40 mg via INTRAVENOUS
  Filled 2016-12-02 (×2): qty 4

## 2016-12-02 MED ORDER — FUROSEMIDE 10 MG/ML IJ SOLN: 40.0000 mg | Freq: Once | INTRAMUSCULAR | Status: DC

## 2016-12-02 NOTE — Progress Notes (Signed)
[facility] Progress Note  Patient: Brian [name] [id number]:[id number]   PCP: Orpah Melter, MD DOB: 02/15/1951   DOA: 11/29/2016   DOS: 12/02/2016   Date of Service: the patient was seen and examined on 12/02/2016  Subjective: Complains about pain in his left hip as well as left shoulder. Although the patient was able to roll on his left shoulder for auscultation of his back. No nausea no vomiting. Also complains about severe shortness of breath whenever he has his pain. No chest pain. No dizziness no lightheadedness. No diarrhea.  Brief hospital course: Pt. with PMH of HTN, anxiety, cocaine abuse, active smoker, chronic diastolic CHF, A. flutter on Xarelto; admitted on 11/29/2016, presented with complaint of redness of the leg, was found to have cellulitis. Patient was on vancomycin and then developed polyarthralgia and transitioned to doxycycline. Recently seen orthopedic as an outpatient and had left knee aspiration as well as steroid injection. Had a flutter with RVR, initially scheduled on Thursday now scheduled outpatient. Currently further plan is symptom control and further workup for polyarthralgia.  Assessment and Plan: 1. Sepsis with left lower extremity cellulitis. Currently resolved. Blood cultures no growth to date. ESR and CRP elevated. No open lesions identified. Was on IV vancomycin currently on doxycycline and continue to monitor.  2. Acute polyarthralgia. Patient has chronic left knee pain but few days ago complaining about having joint pains involving hand, elbow, wrist, shoulder, ankle, great toe. There was suspicion for about a uric acid level is negative and no evidence of swelling or redness involving the joint. There is also suspicion for tickborne illness although no rash classical for Lyme's disease identified. This is more likely arthralgia other than arthritis. X-ray shoulder does not show any evidence of acute abnormality, ask her hip also unremarkable. His pain  does not get better with current treatment were discussed with orthopedics for further assistance or Recheck ESR and CRP.  3. Atrial flutter with RVR. On Xarelto which will be continued. U triggered by sepsis. Was on Cardizem infusion currently stopped. Continue Toprol. EP was consulted who has scheduled a flutter ablation as an outpatient. CHA2DS2-VASc Scoreis 3,  4. Acute on chronic diastolic dysfunction. Acute flare back in the setting of holding patient's oral diuretic as well as giving of the fluids in the setting of sepsis. Currently patient is short of breath has bilateral swelling of the leg as well as bibasilar crackles on examination. Patient will be started on IV Lasix and will monitor daily weight and ins and outs.  5. Anxiety. When necessary Xopenex.  6. Substance abuse with tobacco as well as cocaine. Patient was considered for quit using the substances. Nicotine patch provided. No evidence of active withdrawal at present.  Diet: Cardiac diet DVT Prophylaxis: on therapeutic anticoagulation.  Advance goals of care discussion: Full code  Family Communication: family was present at bedside, at the time of interview. The pt provided permission to discuss medical plan with the family. Opportunity was given to ask question and all questions were answered satisfactorily.   Disposition:  Discharge to home.  Consultants: None Procedures: none  Antibiotics: Anti-infectives    Start     Dose/Rate Route Frequency Ordered Stop   12/01/16 1030  doxycycline (VIBRAMYCIN) 100 mg in dextrose 5 % 250 mL IVPB     100 mg 125 mL/hr over 120 Minutes Intravenous Every 12 hours 12/01/16 0927     11/29/16 2200  vancomycin (VANCOCIN) IVPB 1000 mg/200 mL premix  Status:  Discontinued  1,000 mg 200 mL/hr over 60 Minutes Intravenous Every 12 hours 11/29/16 2125 12/01/16 0927       Objective: Physical Exam: Vitals:   12/01/16 2000 12/02/16 0000 12/02/16 0400 12/02/16 0500  BP:  127/74     Pulse:      Resp: (!) 22     Temp: 97.9 F (36.6 C) 97.9 F (36.6 C) 97.9 F (36.6 C)   TempSrc: Oral Oral Oral   SpO2:      Weight:    110.6 kg (243 lb 13.3 oz)  Height:        Intake/Output Summary (Last 24 hours) at 12/02/16 1617 Last data filed at 12/02/16 0600  Gross per 24 hour  Intake              613 ml  Output              425 ml  Net              188 ml   Filed Weights   11/30/16 0300 12/01/16 0500 12/02/16 0500  Weight: 106.9 kg (235 lb 10.8 oz) 113.3 kg (249 lb 12.5 oz) 110.6 kg (243 lb 13.3 oz)   General: Alert, Awake and Oriented to Time, Place and Person. Appear in moderate distress, affect appropriate Eyes: PERRL, Conjunctiva normal ENT: Oral Mucosa clear moist. Neck: difficult to assess JVD, no Abnormal Mass Or lumps Cardiovascular: S1 and S2 Present, no Murmur, Peripheral Pulses Present Respiratory: normal respiratory effort, Bilateral Air entry equal and Decreased, no use of accessory muscle, bilateral Crackles, no wheezes Abdomen: Bowel Sound present, Soft and no tenderness, no hernia Skin: no redness, brown color macular Rash left knee fold, no induration Extremities: bilateral Pedal edema, no calf tenderness Neurologic: Grossly no focal neuro deficit. Bilaterally Equal motor strength  Data Reviewed: CBC:  Recent Labs Lab 11/29/16 2021 11/30/16 0547 12/01/16 0233 12/02/16 0312  WBC 19.7* 19.0* 15.3* 16.1*  NEUTROABS 16.9*  --   --   --   HGB 10.5* 9.0* 8.5* 8.8*  HCT 34.0* 29.4* 28.3* 29.0*  MCV 84.8 85.7 85.0 84.5  PLT 468* 420* 345 664   Basic Metabolic Panel:  Recent Labs Lab 11/29/16 2021 11/29/16 2216 11/30/16 0547 12/01/16 0233 12/02/16 0312  NA 137 138 136 131* 131*  K 6.2* 4.5 3.9 4.4 4.8  CL 101 103 101 99* 97*  CO2 '26 25 25 27 24  ' GLUCOSE 119* 132* 137* 143* 150*  BUN 22* 22* 21* 13 16  CREATININE 1.03 0.92 0.97 0.66 0.76  CALCIUM 8.3* 8.2* 8.0* 8.1* 8.5*    Liver Function Tests:  Recent Labs Lab  12/01/16 0233  AST 17  ALT 16*  ALKPHOS 57  BILITOT 0.6  PROT 6.5  ALBUMIN 2.5*   No results for input(s): LIPASE, AMYLASE in the last 168 hours. No results for input(s): AMMONIA in the last 168 hours. Coagulation Profile:  Recent Labs Lab 11/29/16 2218  INR 1.65   Cardiac Enzymes:  Recent Labs Lab 11/30/16 0023 11/30/16 0547  CKTOTAL 35*  --   TROPONINI <0.03 <0.03   BNP (last 3 results) No results for input(s): PROBNP in the last 8760 hours. CBG:  Recent Labs Lab 11/30/16 0726 11/30/16 1244 12/01/16 0530 12/02/16 0848  GLUCAP 133* 139* 101* 122*   Studies: Dg Chest 2 View  Result Date: 12/02/2016 CLINICAL DATA:  Shortness of breath with chest pain.  Hypertension. EXAM: CHEST  2 VIEW COMPARISON:  11/11/2016. FINDINGS: The heart is enlarged. There  is mild vascular congestion. No active infiltrates or failure. LEFT pleural effusion. Slight worsening aeration. IMPRESSION: Cardiomegaly mild vascular congestion, with slight worsening compared with priors. Chronic LEFT pleural effusion. Electronically Signed   By: Staci Righter M.D.   On: 12/02/2016 13:16   Dg Shoulder Left  Result Date: 12/02/2016 CLINICAL DATA:  Acute left shoulder pain. EXAM: LEFT SHOULDER - 2+ VIEW COMPARISON:  None. FINDINGS: There is no evidence of fracture or dislocation. There is no evidence of arthropathy or other focal bone abnormality. Soft tissues are unremarkable. IMPRESSION: Negative. Electronically Signed   By: Lorriane Shire M.D.   On: 12/02/2016 13:11    Scheduled Meds: . amiodarone  200 mg Oral Daily  . docusate sodium  200 mg Oral Daily  . famotidine  40 mg Oral BID  . furosemide  40 mg Intravenous Daily  . metoprolol succinate  25 mg Oral Daily  . nicotine  21 mg Transdermal Daily  . pneumococcal 23 valent vaccine  0.5 mL Intramuscular Tomorrow-1000  . rivaroxaban  20 mg Oral Q breakfast  . sodium chloride flush  3 mL Intravenous Q12H   Continuous Infusions: . doxycycline  (VIBRAMYCIN) IV Stopped (12/02/16 1205)   PRN Meds: acetaminophen, ALPRAZolam, HYDROcodone-acetaminophen, ondansetron, zolpidem  Time spent: 35 minutes  Author: Berle Mull, MD [facility] Pager: [phone] 12/02/2016 4:17 PM  If 7PM-7AM, please contact night-coverage at [email or web address], password [facility]

## 2016-12-02 NOTE — Progress Notes (Signed)
Flutter ablation rescheduled to Tuesday, July 3rd, 1st case with Dr Rayann Heman.  Reinforced with patient importance of not missing any doses of anticoagulation between now and then.  Follow up with Dr Rayann Heman week before procedure scheduled.  Appts and instructions entered in AVS  Chanetta Marshall, NP 12/02/2016 7:59 AM  [name] MD, [facility] 12/02/2016 1:10 PM

## 2016-12-02 NOTE — Progress Notes (Signed)
Patient transferred from 4N.  Arrived via wheelchair.  Family at bedside, no needs voiced at this time. Marcille Blanco, RN

## 2016-12-03 DIAGNOSIS — M130 Polyarthritis, unspecified: Secondary | ICD-10-CM

## 2016-12-03 LAB — CBC WITH DIFFERENTIAL/PLATELET
Basophils Absolute: 0.1 10*3/uL (ref 0.0–0.1)
Basophils Relative: 1 %
EOS PCT: 3 %
Eosinophils Absolute: 0.5 10*3/uL (ref 0.0–0.7)
HCT: 28.6 % — ABNORMAL LOW (ref 39.0–52.0)
HEMOGLOBIN: 8.7 g/dL — AB (ref 13.0–17.0)
LYMPHS ABS: 1.6 10*3/uL (ref 0.7–4.0)
LYMPHS PCT: 11 %
MCH: 25.4 pg — AB (ref 26.0–34.0)
MCHC: 30.4 g/dL (ref 30.0–36.0)
MCV: 83.6 fL (ref 78.0–100.0)
MONOS PCT: 8 %
Monocytes Absolute: 1.2 10*3/uL — ABNORMAL HIGH (ref 0.1–1.0)
NEUTROS PCT: 77 %
Neutro Abs: 11.4 10*3/uL — ABNORMAL HIGH (ref 1.7–7.7)
Platelets: 455 10*3/uL — ABNORMAL HIGH (ref 150–400)
RBC: 3.42 MIL/uL — AB (ref 4.22–5.81)
RDW: 14.4 % (ref 11.5–15.5)
WBC: 14.7 10*3/uL — ABNORMAL HIGH (ref 4.0–10.5)

## 2016-12-03 LAB — C-REACTIVE PROTEIN: CRP: 19.2 mg/dL — ABNORMAL HIGH (ref <1.0)

## 2016-12-03 LAB — COMPREHENSIVE METABOLIC PANEL
ALK PHOS: 73 U/L (ref 38–126)
ALT: 18 U/L (ref 17–63)
ANION GAP: 9 (ref 5–15)
AST: 17 U/L (ref 15–41)
Albumin: 2.3 g/dL — ABNORMAL LOW (ref 3.5–5.0)
BUN: 14 mg/dL (ref 6–20)
CALCIUM: 8.3 mg/dL — AB (ref 8.9–10.3)
CO2: 27 mmol/L (ref 22–32)
CREATININE: 0.73 mg/dL (ref 0.61–1.24)
Chloride: 100 mmol/L — ABNORMAL LOW (ref 101–111)
GFR calc Af Amer: >60 mL/min (ref >60)
Glucose, Bld: 121 mg/dL — ABNORMAL HIGH (ref 65–99)
Potassium: 4.5 mmol/L (ref 3.5–5.1)
Sodium: 136 mmol/L (ref 135–145)
TOTAL PROTEIN: 6.8 g/dL (ref 6.5–8.1)
Total Bilirubin: 0.7 mg/dL (ref 0.3–1.2)

## 2016-12-03 LAB — SEDIMENTATION RATE: Sed Rate: 122 mm/hr — ABNORMAL HIGH (ref 0–16)

## 2016-12-03 LAB — MAGNESIUM: MAGNESIUM: 1.9 mg/dL (ref 1.7–2.4)

## 2016-12-03 LAB — ROCKY MTN SPOTTED FVR ABS PNL(IGG+IGM)
RMSF IGM: 0.34 {index} (ref 0.00–0.89)
RMSF IgG: NEGATIVE

## 2016-12-03 MED ORDER — DOXYCYCLINE HYCLATE 100 MG PO TABS
100.0000 mg | ORAL_TABLET | Freq: Two times a day (BID) | ORAL | Status: DC
  Administered 2016-12-03 – 2016-12-04 (×3): 100 mg via ORAL
  Filled 2016-12-03 (×3): qty 1

## 2016-12-03 MED ORDER — PREDNISONE 20 MG PO TABS
20.0000 mg | ORAL_TABLET | Freq: Every day | ORAL | Status: DC
  Administered 2016-12-03 – 2016-12-04 (×2): 20 mg via ORAL
  Filled 2016-12-03 (×2): qty 1

## 2016-12-03 NOTE — Progress Notes (Signed)
[facility] Progress Note  Patient: Brian [name] WUG:[id number]   PCP: Orpah Melter, MD DOB: 1951-06-10   DOA: 11/29/2016   DOS: 12/03/2016   Date of Service: the patient was seen and examined on 12/03/2016  Subjective: Continues to complain about his pain and left shoulder as well as left knee and left hip. No nausea no vomiting. Complains about morning stiffness. No fever no chills. Continues to have swelling of his left leg. No chest pain no dizziness or lightheadedness no weakness. No diarrhea.  Brief hospital course: Pt. with PMH of HTN, anxiety, cocaine abuse, active smoker, chronic diastolic CHF, A. flutter on Xarelto; admitted on 11/29/2016, presented with complaint of redness of the leg, was found to have cellulitis. Patient was on vancomycin and then developed polyarthralgia and transitioned to doxycycline. Recently seen orthopedic as an outpatient and had left knee aspiration as well as steroid injection. Had a flutter with RVR, initially scheduled on Thursday now scheduled outpatient. Currently further plan is symptom control and further workup for polyarthralgia.  Assessment and Plan: 1. Sepsis with left lower extremity cellulitis. Currently resolved. Blood cultures no growth to date. ESR and CRP elevated. No open lesions identified. Was on IV vancomycin currently on doxycycline and continue to monitor.  2. Acute polyarthralgia. Most likely inflammatory arthritis Patient has chronic left knee pain but few days ago complaining about having joint pains involving hand, elbow, wrist, shoulder, ankle, great toe. There was suspicion for about a uric acid level is negative and no evidence of swelling or redness involving the joint. There is also suspicion for tickborne illness although no rash classical for Lyme's disease identified. Serology for Lyme's and RMSF negative. X-ray shoulder does not show any evidence of acute abnormality,  hip also unremarkable. Repeat ESR and CRP  are even more elevated. This is despite no clinical evidence of active infection.  Appreciate orthopedic input. Do not suspect septic arthritis. Given presence of morning stiffness as well as multiple joint involvement more likely inflammatory arthritis. I would start the patient on 20 mg oral prednisone and check rheumatoid factor, CCP, HLA-B27. ANA currently pending.  3. Atrial flutter with RVR. On Xarelto which will be continued. U triggered by sepsis. Was on Cardizem infusion currently stopped. Continue Toprol. EP was consulted who has scheduled a flutter ablation as an outpatient. CHA2DS2-VASc Scoreis 3,  4. Acute on chronic diastolic dysfunction. Acute flare back in the setting of holding patient's oral diuretic as well as giving of the fluids in the setting of sepsis. Currently patient is short of breath has bilateral swelling of the leg as well as bibasilar crackles on examination. Patient will be started on IV Lasix and will monitor daily weight and ins and outs.  5. Anxiety. When necessary Xopenex.  6. Substance abuse with tobacco as well as cocaine. Patient was considered for quit using the substances. Nicotine patch provided. No evidence of active withdrawal at present.  Diet: Cardiac diet DVT Prophylaxis: on therapeutic anticoagulation.  Advance goals of care discussion: Full code  Family Communication: family was present at bedside, at the time of interview. The pt provided permission to discuss medical plan with the family. Opportunity was given to ask question and all questions were answered satisfactorily.    Disposition:  Discharge to home.  Consultants: Orthopedics Procedures: None  Antibiotics: Anti-infectives    Start     Dose/Rate Route Frequency Ordered Stop   12/03/16 1000  doxycycline (VIBRA-TABS) tablet 100 mg     100 mg Oral  Every 12 hours 12/03/16 0850     12/01/16 1030  doxycycline (VIBRAMYCIN) 100 mg in dextrose 5 % 250 mL IVPB   Status:  Discontinued     100 mg 125 mL/hr over 120 Minutes Intravenous Every 12 hours 12/01/16 0927 12/03/16 0849   11/29/16 2200  vancomycin (VANCOCIN) IVPB 1000 mg/200 mL premix  Status:  Discontinued     1,000 mg 200 mL/hr over 60 Minutes Intravenous Every 12 hours 11/29/16 2125 12/01/16 0927       Objective: Physical Exam: Vitals:   12/02/16 1930 12/03/16 0452 12/03/16 0920 12/03/16 1457  BP: (!) 124/51 131/76 110/80 116/66  Pulse: 89 (!) 110 (!) 138 (!) 122  Resp: '18 17 20 18  ' Temp: 98.2 F (36.8 C) 98 F (36.7 C) 98 F (36.7 C) 98 F (36.7 C)  TempSrc: Oral Oral Oral Oral  SpO2: 93% 92% 95% 96%  Weight:  110 kg (242 lb 6.4 oz)    Height:        Intake/Output Summary (Last 24 hours) at 12/03/16 1522 Last data filed at 12/03/16 1443  Gross per 24 hour  Intake             1080 ml  Output             3150 ml  Net            -2070 ml   Filed Weights   12/01/16 0500 12/02/16 0500 12/03/16 0452  Weight: 113.3 kg (249 lb 12.5 oz) 110.6 kg (243 lb 13.3 oz) 110 kg (242 lb 6.4 oz)   General: Alert, Awake and Oriented to Time, Place and Person. Appear in mild distress, affect appropriate Eyes Conjunctiva normal ENT: Oral Mucosa clear moist. Neck: difficult to assess JVD, no Abnormal Mass Or lumps Cardiovascular: S1 and S2 Present, no Murmur, Peripheral Pulses Present Respiratory: normal respiratory effort, Bilateral Air entry equal and Decreased, no use of accessory muscle, Clear to Auscultation, no Crackles, no wheezes Abdomen: Bowel Sound present, Soft and no tenderness, no hernia Skin: no redness, brownish macular rash on the left leg  no induration Extremities: right Pedal edema, no calf tenderness Neurologic: Grossly no focal neuro deficit. Bilaterally Equal motor strength  Data Reviewed: CBC:  Recent Labs Lab 11/29/16 2021 11/30/16 0547 12/01/16 0233 12/02/16 0312 12/03/16 0350  WBC 19.7* 19.0* 15.3* 16.1* 14.7*  NEUTROABS 16.9*  --   --   --  11.4*  HGB  10.5* 9.0* 8.5* 8.8* 8.7*  HCT 34.0* 29.4* 28.3* 29.0* 28.6*  MCV 84.8 85.7 85.0 84.5 83.6  PLT 468* 420* 345 385 177*   Basic Metabolic Panel:  Recent Labs Lab 11/29/16 2216 11/30/16 0547 12/01/16 0233 12/02/16 0312 12/03/16 0350  NA 138 136 131* 131* 136  K 4.5 3.9 4.4 4.8 4.5  CL 103 101 99* 97* 100*  CO2 '25 25 27 24 27  ' GLUCOSE 132* 137* 143* 150* 121*  BUN 22* 21* '13 16 14  ' CREATININE 0.92 0.97 0.66 0.76 0.73  CALCIUM 8.2* 8.0* 8.1* 8.5* 8.3*  MG  --   --   --   --  1.9    Liver Function Tests:  Recent Labs Lab 12/01/16 0233 12/03/16 0350  AST 17 17  ALT 16* 18  ALKPHOS 57 73  BILITOT 0.6 0.7  PROT 6.5 6.8  ALBUMIN 2.5* 2.3*   No results for input(s): LIPASE, AMYLASE in the last 168 hours. No results for input(s): AMMONIA in the last 168 hours. Coagulation Profile:  Recent Labs Lab 11/29/16 2218  INR 1.65   Cardiac Enzymes:  Recent Labs Lab 11/30/16 0023 11/30/16 0547  CKTOTAL 35*  --   TROPONINI <0.03 <0.03   BNP (last 3 results) No results for input(s): PROBNP in the last 8760 hours. CBG:  Recent Labs Lab 11/30/16 0726 11/30/16 1244 12/01/16 0530 12/02/16 0848  GLUCAP 133* 139* 101* 122*   Studies: No results found.  Scheduled Meds: . amiodarone  200 mg Oral Daily  . docusate sodium  200 mg Oral Daily  . doxycycline  100 mg Oral Q12H  . famotidine  40 mg Oral BID  . furosemide  40 mg Intravenous Daily  . metoprolol succinate  25 mg Oral Daily  . nicotine  21 mg Transdermal Daily  . pneumococcal 23 valent vaccine  0.5 mL Intramuscular Tomorrow-1000  . predniSONE  20 mg Oral Q breakfast  . rivaroxaban  20 mg Oral Q breakfast  . sodium chloride flush  3 mL Intravenous Q12H   Continuous Infusions: PRN Meds: acetaminophen, ALPRAZolam, HYDROcodone-acetaminophen, ondansetron, zolpidem  Time spent: 35 minutes  Author: Berle Mull, MD [facility] Pager: [phone] 12/03/2016 3:22 PM  If 7PM-7AM, please contact  night-coverage at [email or web address], password [facility]

## 2016-12-03 NOTE — Plan of Care (Signed)
Problem: Skin Integrity: Goal: Risk for impaired skin integrity will decrease Outcome: Progressing Skin assessed, no red areas from pressure noted.  Patient is able to reposition self frequently.  Importance of reporting any changes in skin integrity to nurse stressed to patient.  Patient verbalized understanding.  Progressing towards goal.

## 2016-12-03 NOTE — Consult Note (Signed)
ORTHOPAEDIC CONSULTATION  REQUESTING PHYSICIAN: Lavina Hamman, MD  Chief Complaint: left shoulder and knee pain  HPI: Brian [name] is a 66 y.o. male who presents with left shoulder and knee pain for a several days that caused him to come to the hospital.  He had cellulitis of his LLE.  His cellulitis has improved.  He denies any constitutional sxs.  He is able to walk and move his left shoulder today.  He does have some moderate pain.  He states he may have been told he had RA by another MD in the past.  I gave him a steroid injection about a week ago.  I did aspirate his knee in the office which was negative for infection.  Ortho consulted today.   Past Medical History:  Diagnosis Date  . Anxiety   . Arthritis   . Atrial flutter ([place])    a. 08/2012  . CHF (congestive heart failure) ([place])   . Gout   . Hypertension   . Obesity   . Pneumonia   . Shortness of breath   . Stroke ([place])   . Tobacco abuse    Past Surgical History:  Procedure Laterality Date  . CARDIOVERSION N/A 11/05/2016   Procedure: CARDIOVERSION;  Surgeon: Sueanne Margarita, MD;  Location: [facility];  Service: Cardiovascular;  Laterality: N/A;  . IR THORACENTESIS ASP PLEURAL SPACE W/IMG GUIDE  10/01/2016  . MICROLARYNGOSCOPY  09/02/2007   with excision of right vocal cord mass, Dr. Constance Holster   Social History   Social History  . Marital status: Divorced    Spouse name: N/A  . Number of children: 3  . Years of education: N/A   Social History Main Topics  . Smoking status: Current Every Day Smoker    Packs/day: 1.00    Years: 50.00    Types: Cigarettes  . Smokeless tobacco: Never Used  . Alcohol use No     Comment: socially  . Drug use: No  . Sexual activity: Not Currently   Other Topics Concern  . None   Social History Narrative   Lives in [place]    Works at a convenience store   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   Divorced with 3 children and 9 grandchildren, he states ex-wife is still a friend    Family History  Problem Relation Age of Onset  . Cancer Mother   . Heart disease Father   . Heart attack Father   . Cancer Father   . Diabetes Sister    - negative except otherwise stated in the family history section Allergies  Allergen Reactions  . Tape Rash    Paper tape is preferred, please!!   Prior to Admission medications   Medication Sig Start Date End Date Taking? Authorizing Provider  acetaminophen (TYLENOL) 500 MG tablet Take 1,000 mg by mouth every 6 (six) hours as needed (pain).   Yes [provider]  amiodarone (PACERONE) 200 MG tablet Take 1 tablet (200 mg total) by mouth daily. 11/12/16  Yes Sherran Needs, NP  Aspirin-Caffeine (BC FAST PAIN RELIEF ARTHRITIS PO) Take 2 Packages by mouth 2 (two) times daily.   Yes [provider]  Docusate Calcium (STOOL SOFTENER PO) Take 3 capsules by mouth daily.    Yes [provider]  furosemide (LASIX) 40 MG tablet Take 80 mg by mouth 2 (two) times daily.   Yes [provider]  HYDROcodone-acetaminophen (NORCO/VICODIN) 5-325 MG tablet Take 1 tablet by mouth every 4 (  four) hours as needed. Patient taking differently: Take 0.5-1 tablets by mouth every 4 (four) hours as needed.  11/23/16  Yes Nona Dell, PA-C  hydrOXYzine (ATARAX/VISTARIL) 25 MG tablet Take 25 mg by mouth daily as needed for anxiety.   Yes [provider]  metoprolol succinate (TOPROL XL) 25 MG 24 hr tablet Take 1 tablet (25 mg total) by mouth daily. 10/23/16  Yes Jerline Pain, MD  potassium chloride SA (K-DUR,KLOR-CON) 20 MEQ tablet Take 1 tablet (20 mEq total) by mouth daily. 10/26/16  Yes Jerline Pain, MD  rivaroxaban (XARELTO) 20 MG TABS tablet Take 1 tablet (20 mg total) by mouth daily with supper. Patient taking differently: Take 20 mg by mouth every morning.  01/17/16  Yes Isaiah Serge, NP   Dg Chest 2 View  Result Date: 12/02/2016 CLINICAL DATA:  Shortness of breath with chest pain.   Hypertension. EXAM: CHEST  2 VIEW COMPARISON:  11/11/2016. FINDINGS: The heart is enlarged. There is mild vascular congestion. No active infiltrates or failure. LEFT pleural effusion. Slight worsening aeration. IMPRESSION: Cardiomegaly mild vascular congestion, with slight worsening compared with priors. Chronic LEFT pleural effusion. Electronically Signed   By: Staci Righter M.D.   On: 12/02/2016 13:16   Dg Shoulder Left  Result Date: 12/02/2016 CLINICAL DATA:  Acute left shoulder pain. EXAM: LEFT SHOULDER - 2+ VIEW COMPARISON:  None. FINDINGS: There is no evidence of fracture or dislocation. There is no evidence of arthropathy or other focal bone abnormality. Soft tissues are unremarkable. IMPRESSION: Negative. Electronically Signed   By: Lorriane Shire M.D.   On: 12/02/2016 13:11   - pertinent xrays, CT, MRI studies were reviewed and independently interpreted  Positive ROS: All other systems have been reviewed and were otherwise negative with the exception of those mentioned in the HPI and as above.  Physical Exam: General: Alert, no acute distress Cardiovascular: No pedal edema Respiratory: No cyanosis, no use of accessory musculature GI: No organomegaly, abdomen is soft and non-tender Skin: No lesions in the area of chief complaint Neurologic: Sensation intact distally Psychiatric: Patient is competent for consent with normal mood and affect Lymphatic: No axillary or cervical lymphadenopathy  MUSCULOSKELETAL:  - left knee is minimally edematous w/o cellulitis or any real limitation in ROM due to pain - left shoulder has good ROM w/o signs of infection - left calf is swollen and ttp  Assessment: Left shoulder and knee pain Left calf redness  Plan: - patient's presentation is c/w more of an inflammatory arthritis process, doubt infectious process - recommend MRI of left calf to r/o abscess - recommend appropriate labs to evaluate for RA   Thank you for the consult and the  opportunity to see Mr. Brian [name]. Eduard Roux, MD [place] 12:22 PM

## 2016-12-03 NOTE — Consult Note (Addendum)
   [facility] CM Inpatient Consult   12/03/2016  Brian [name] 31-May-1951 [id number]  Referral received to assess for care management services.    Met with the patient and family member regarding the benefits of [facility] Care Management services with his [employer] plan. Explained that [name] is a covered benefit of insurance. Review information for [facility] Care Management and a brochure was provided with contact information.  Explained that [facility] does not interfere with or replace any services arranged by the inpatient care management staff. Patient endorses Dr. Orpah Melter as his primary care provider.   Patient declined services with [facility].  Patient states, "I don't need it and if I don't recognized the number I will just hang up on them."  Patient accepted the brochure and 24 hour nurse line magnet with contact information and encouraged to call at anytime.  For questions, please contact:  Natividad Brood, RN BSN [facility] Liaison  [phone] business mobile phone Toll free office [phone]

## 2016-12-03 NOTE — Care Management Important Message (Signed)
Important Message  Patient Details  Name: Brian [name] MRN: [id number] Date of Birth: 1950-12-05   Medicare Important Message Given:  Yes    Yarah Fuente [address] 12/03/2016, 11:48 AM

## 2016-12-03 NOTE — Progress Notes (Signed)
Pt is alert and oriented with stable vitals, ambulating as tolerated, plan for MRI of lower left leg tonight around 11pm or sooner.

## 2016-12-03 NOTE — Discharge Instructions (Addendum)
Flutter ablation rescheduled to Tuesday, 12/29/16 at 7:30AM. Come to admitting at the [facility] at 5:30AM morning of procedure. Nothing to eat or drink after midnight night before procedure. Do not miss any doses of anticoagulation between now and procedure date. Please call the office at [phone] with questions.   Information on my medicine - XARELTO (Rivaroxaban)   Why was Xarelto prescribed for you? Xarelto was prescribed for you to reduce the risk of a blood clot forming that can cause a stroke if you have a medical condition called atrial fibrillation (a type of irregular heartbeat).  What do you need to know about xarelto ? Take your Xarelto ONCE DAILY at the same time every day with your evening meal. If you have difficulty swallowing the tablet whole, you may crush it and mix in applesauce just prior to taking your dose.  Take Xarelto exactly as prescribed by your doctor and DO NOT stop taking Xarelto without talking to the doctor who prescribed the medication.  Stopping without other stroke prevention medication to take the place of Xarelto may increase your risk of developing a clot that causes a stroke.  Refill your prescription before you run out.  After discharge, you should have regular check-up appointments with your healthcare provider that is prescribing your Xarelto.  In the future your dose may need to be changed if your kidney function or weight changes by a significant amount.  What do you do if you miss a dose? If you are taking Xarelto ONCE DAILY and you miss a dose, take it as soon as you remember on the same day then continue your regularly scheduled once daily regimen the next day. Do not take two doses of Xarelto at the same time or on the same day.   Important Safety Information A possible side effect of Xarelto is bleeding. You should call your healthcare provider right away if you experience any of the following: ? Bleeding from an injury or your nose  that does not stop. ? Unusual colored urine (red or dark brown) or unusual colored stools (red or black). ? Unusual bruising for unknown reasons. ? A serious fall or if you hit your head (even if there is no bleeding).  Some medicines may interact with Xarelto and might increase your risk of bleeding while on Xarelto. To help avoid this, consult your healthcare provider or pharmacist prior to using any new prescription or non-prescription medications, including herbals, vitamins, non-steroidal anti-inflammatory drugs (NSAIDs) and supplements.  This website has more information on Xarelto: [email or web address]-benson.[email or web address]    Hematoma A hematoma is a collection of blood. The collection of blood can turn into a hard, painful lump under the skin. Your skin may turn blue or yellow if the hematoma is close to the surface of the skin. Most hematomas get better in a few days to weeks. Some hematomas are serious and need medical care. Hematomas can be very small or very big. Follow these instructions at home:  Apply ice to the injured area: ? Put ice in a plastic bag. ? Place a towel between your skin and the bag. ? Leave the ice on for 20 minutes, 2-3 times a day for the first 1 to 2 days.  After the first 2 days, switch to using warm packs on the injured area.  Raise (elevate) the injured area to lessen pain and puffiness (swelling). You may also wrap the area with an elastic bandage. Make sure the bandage is not wrapped too tight.  If you have a painful hematoma on your leg or foot, you may use crutches for a couple days.  Only take medicines as told by your doctor. Get help right away if:  Your pain gets worse.  Your pain is not controlled with medicine.  You have a fever.  Your puffiness gets worse.  Your skin turns more blue or yellow.  Your skin over the hematoma breaks or starts bleeding.  Your hematoma is in your chest or belly (abdomen) and you are short of breath, feel weak, or have a  change in consciousness.  Your hematoma is on your scalp and you have a headache that gets worse or a change in alertness or consciousness. This information is not intended to replace advice given to you by your health care provider. Make sure you discuss any questions you have with your health care provider. Document Released: 07/23/2004 Document Revised: 11/21/2015 Document Reviewed: 11/23/2012 Elsevier Interactive Patient Education  2017 [employer].

## 2016-12-04 ENCOUNTER — Inpatient Hospital Stay (HOSPITAL_COMMUNITY): Payer: PPO

## 2016-12-04 LAB — CULTURE, BLOOD (ROUTINE X 2)
Culture: NO GROWTH
Culture: NO GROWTH
SPECIAL REQUESTS: ADEQUATE
Special Requests: ADEQUATE

## 2016-12-04 LAB — BASIC METABOLIC PANEL
ANION GAP: 10 (ref 5–15)
BUN: 16 mg/dL (ref 6–20)
CALCIUM: 8.4 mg/dL — AB (ref 8.9–10.3)
CO2: 27 mmol/L (ref 22–32)
CREATININE: 0.6 mg/dL — AB (ref 0.61–1.24)
Chloride: 99 mmol/L — ABNORMAL LOW (ref 101–111)
Glucose, Bld: 126 mg/dL — ABNORMAL HIGH (ref 65–99)
Potassium: 4.5 mmol/L (ref 3.5–5.1)
SODIUM: 136 mmol/L (ref 135–145)

## 2016-12-04 LAB — CBC
HCT: 29.9 % — ABNORMAL LOW (ref 39.0–52.0)
HEMOGLOBIN: 9 g/dL — AB (ref 13.0–17.0)
MCH: 25 pg — ABNORMAL LOW (ref 26.0–34.0)
MCHC: 30.1 g/dL (ref 30.0–36.0)
MCV: 83.1 fL (ref 78.0–100.0)
PLATELETS: 501 10*3/uL — AB (ref 150–400)
RBC: 3.6 MIL/uL — AB (ref 4.22–5.81)
RDW: 14.2 % (ref 11.5–15.5)
WBC: 12.6 10*3/uL — AB (ref 4.0–10.5)

## 2016-12-04 LAB — ANTINUCLEAR ANTIBODIES, IFA: ANTINUCLEAR ANTIBODIES, IFA: NEGATIVE

## 2016-12-04 LAB — CYCLIC CITRUL PEPTIDE ANTIBODY, IGG/IGA

## 2016-12-04 LAB — GLUCOSE, CAPILLARY: GLUCOSE-CAPILLARY: 117 mg/dL — AB (ref 65–99)

## 2016-12-04 LAB — RHEUMATOID FACTOR: RHEUMATOID FACTOR: 512.7 [IU]/mL — AB (ref 0.0–13.9)

## 2016-12-04 MED ORDER — PREDNISONE 20 MG PO TABS: 20.0000 mg | ORAL_TABLET | Freq: Every day | ORAL | 0 refills | Status: DC

## 2016-12-04 MED ORDER — FUROSEMIDE 80 MG PO TABS
80.0000 mg | ORAL_TABLET | Freq: Two times a day (BID) | ORAL | Status: DC
  Administered 2016-12-04: 80 mg via ORAL
  Filled 2016-12-04: qty 1

## 2016-12-04 MED ORDER — METOPROLOL SUCCINATE ER 50 MG PO TB24: 50.0000 mg | ORAL_TABLET | Freq: Every day | ORAL | Status: DC

## 2016-12-04 MED ORDER — FAMOTIDINE 40 MG PO TABS: 20.0000 mg | ORAL_TABLET | Freq: Every day | ORAL | 0 refills | Status: DC

## 2016-12-04 MED ORDER — METOPROLOL SUCCINATE ER 25 MG PO TB24
25.0000 mg | ORAL_TABLET | Freq: Once | ORAL | Status: AC
  Administered 2016-12-04: 25 mg via ORAL
  Filled 2016-12-04: qty 1

## 2016-12-04 MED ORDER — DOXYCYCLINE HYCLATE 100 MG PO TABS: 100.0000 mg | ORAL_TABLET | Freq: Two times a day (BID) | ORAL | 0 refills | Status: AC

## 2016-12-04 MED ORDER — NICOTINE 21 MG/24HR TD PT24: 21.0000 mg | MEDICATED_PATCH | Freq: Every day | TRANSDERMAL | 0 refills | Status: DC

## 2016-12-04 MED ORDER — METHOCARBAMOL 500 MG PO TABS: 500.0000 mg | ORAL_TABLET | Freq: Three times a day (TID) | ORAL | 0 refills | Status: DC | PRN

## 2016-12-04 NOTE — Progress Notes (Signed)
Patient unable to complete MRI test. Patient was not able to tolerate the pressure of the machine toward his legs. Vicodin and Xanax given prior to the test.

## 2016-12-04 NOTE — Progress Notes (Signed)
Patient HR 135 sustaining. Patient asymptomatic. Denies any chest pain. MD notified. Amiodarone 200 mg and Metoprolol 25 mg  given.

## 2016-12-04 NOTE — Care Management Note (Addendum)
Case Management Note  Patient Details  Name: Brian [name] MRN: [id number] Date of Birth: [birth year]  Subjective/Objective:       Admitted with Cellulitis of the left leg            Action/Plan: Patient lives at home with his roommate; PCP: Orpah Melter, MD; has private insurance with [employer] with prescription drug coverage; pharmacy of choice is [facility]; DME- pt use a cane at home; patient could benefit from [facility] services but refused [place] at this time; stated that his roommate will help him at home;  1:15 pm- Received call from Attending Md that patient now is agreeable to [facility] services; CM talked to the patient again, he stated " I told the Doctor that I wanted it but I really do not want any HHC, my roommate and my sister can help me if I need it. CM asked pt do you want me to make [place] arrangement, pt stated "No." B Camelle Henkels RN  Expected Discharge Date:    possibly 12/04/2016              Expected Discharge Plan:  [place]  In-House Referral:   [facility]  Discharge planning Services  CM Consult Choice offered to:  Patient  HH Arranged:  Patient Refused [place]  Status of Service:  In process, will continue to follow  Sherrilyn Rist [phone] 12/04/2016, 11:30 AM

## 2016-12-04 NOTE — Progress Notes (Signed)
Orders received for pt discharge.  Discharge summary printed and reviewed with pt.  Explained medication regimen, and pt had no further questions at this time.  IV removed and site remains clean, dry, intact.  Telemetry removed.  Pt in stable condition and awaiting transport. 

## 2016-12-07 ENCOUNTER — Telehealth (INDEPENDENT_AMBULATORY_CARE_PROVIDER_SITE_OTHER): Payer: Self-pay | Admitting: *Deleted

## 2016-12-07 DIAGNOSIS — M25462 Effusion, left knee: Secondary | ICD-10-CM

## 2016-12-07 NOTE — Telephone Encounter (Signed)
See message below °

## 2016-12-07 NOTE — Telephone Encounter (Signed)
ORDER MADE THEY WILL CONTACT HIM

## 2016-12-07 NOTE — Telephone Encounter (Signed)
Pt called stating he needs a referral to Dr. Estanislado Pandy.

## 2016-12-07 NOTE — Telephone Encounter (Signed)
yes

## 2016-12-07 NOTE — Discharge Summary (Addendum)
[facility] Discharge Summary   Patient: Brian [name] JHE:[id number]   PCP: Orpah Melter, MD DOB: 1951-03-16   Date of admission: 11/29/2016   Date of discharge: 12/04/2016    Discharge Diagnoses:  Principal Problem:   Cellulitis of left leg Active Problems:   Tobacco abuse   Anxiety   Atrial flutter with rapid ventricular response (HCC)   Essential hypertension   Chronic diastolic CHF (congestive heart failure) (HCC)   Effusion, left knee   Sepsis ([place])   Polyarthritis   Admitted From: home Disposition:  Home with family  Recommendations for Outpatient Follow-up:  1. Please follow-up with PCP in one week. 2. Recheck CBC and monitor the hematoma and if there is any worsening he may have to discontinue Xarelto, this was also in turn lead to postponing is scheduled flutter ablation. 3. Please establish care with rheumatology as an outpatient. 4. Please take Lasix as recommended and check weight daily.  Follow-up Information    [name], MD Follow up on 12/23/2016.   Specialty:  Cardiology Why:  at 8:45AM Contact information: [place] [address] [phone]        Orpah Melter, MD. Schedule an appointment as soon as possible for a visit in 1 week(s).   Specialty:  Family Medicine Why:  monitor hematoma, and CBC, if worsening need to hold xarelto and may need to postpone ablation.  Contact information: [address] [place] [address] [phone]        rheumatology. Schedule an appointment as soon as possible for a visit in 1 week(s).   Why:  establish care in 1 week for further workup and treatment          Diet recommendation: Cardiac diet  Activity: The patient is advised to gradually reintroduce usual activities.  Discharge Condition: good, clearly discussed discharge planning with the patient as well as his sister.  Code Status: Full code  History of present illness: As per the H and P  dictated on admission, "Brian [name] is a 66 y.o. male with medical history significant of hypertension, anxiety, tobacco abuse, cocaine abuse, dCHF, atrial flutter on Xarelto, cocaine abuse, who presents with left lower leg pain.  Patient states that he has been having left lower leg pain for 4 days. It is constant, 10 out of 10 in severity, nonradiating. The left lower leg is swelling, erythematous posteriorly. Patient denies subjective fever, but his temperature is 180. No chills. Patient states that he has left knee pain and swelling. he left knee joint aspiration on 11/13/16. Synovial fluid analysis showed WBC 5076 and Gram staining negative for organism. He still has left knee pain and mild swelling, but no redness or warmth. He states that he had left knee steroid injection on Tuesday. Patient denies GI symptoms, no symptoms of UTI. No chest pain, SOB, cough, unilateral weakness. He also complains of some left hip pain."  Hospital Course:  Summary of his active problems in the hospital is as following. 1. Sepsis with left lower extremity cellulitis. Currently resolved. Blood cultures no growth to date. ESR and CRP elevated. No open lesions identified. Was on IV vancomycin currently on doxycycline and continue to monitor.  2. Acute polyarthralgia. 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 There was suspicion for gout but uric acid level is negative and no  evidence of active gout There is also suspicion for tickborne illness although no rash classical for Lyme's disease identified. Serology for Lyme's and RMSF negative. X-ray shoulder does not show any evidence of acute abnormality,  hip also unremarkable. Repeat ESR and CRP are even more elevated. This is despite no clinical evidence of active infection.  Appreciate  orthopedic input. Do not suspect septic arthritis. Given presence of morning stiffness as well as multiple joint involvement more likely inflammatory arthritis. Rheumatoid factor positive, CCP positive. HLA-B27 pending. ANA negative. I would start the patient on 20 mg oral prednisone. Patient to establish care with rheumatology as an outpatient for treatment of rheumatoid arthritis.  3. Atrial flutter with RVR. On Xarelto which will be continued. U triggered by sepsis. Was on Cardizem infusion currently stopped. Continue Toprol. EP was consulted who has scheduled a flutter ablation as an outpatient. CHA2DS2-VASc Scoreis 3,  4. Acute on chronic diastolic dysfunction. Acute flareup in the setting of holding patient's oral diuretic as well as giving of the fluids in the setting of sepsis. Currently patient is short of breath has bilateral swelling of the leg as well as bibasilar crackles on examination. Patient was given IV Lasix. We will continue home regimen on discharge.  5.Anxiety. When necessary Xopenex.  6.Substance abuse with tobacco as well as cocaine. Patient was considered for quit using the substances. Nicotine patch provided. No evidence of active withdrawal at present.  All other chronic medical condition were stable during the hospitalization.  Patient was seen by physical therapy, who recommended home health, which was arranged by Child psychotherapist and case Production designer, theatre/television/film. On the day of the discharge the patient's vitals were stable, and no other acute medical condition were reported by patient. the patient was felt safe to be discharge at home with family.  Procedures and Results:   none  Consultations:  Orthopedics  DISCHARGE MEDICATION: Discharge Medication List as of 12/04/2016  2:19 PM    START taking these medications   Details  doxycycline (VIBRA-TABS) 100 MG tablet Take 1 tablet (100 mg total) by mouth every 12 (twelve) hours., Starting Fri 12/04/2016, Until Sun  12/06/2016, Normal    famotidine (PEPCID) 40 MG tablet Take 0.5 tablets (20 mg total) by mouth daily., Starting Fri 12/04/2016, Normal    methocarbamol (ROBAXIN) 500 MG tablet Take 1 tablet (500 mg total) by mouth every 8 (eight) hours as needed for muscle spasms., Starting Fri 12/04/2016, Normal    nicotine (NICODERM CQ - DOSED IN MG/24 HOURS) 21 mg/24hr patch Place 1 patch (21 mg total) onto the skin daily., Starting Sat 12/05/2016, Normal    predniSONE (DELTASONE) 20 MG tablet Take 1 tablet (20 mg total) by mouth daily with breakfast., Starting Sat 12/05/2016, Normal      CONTINUE these medications which have NOT CHANGED   Details  acetaminophen (TYLENOL) 500 MG tablet Take 1,000 mg by mouth every 6 (six) hours as needed (pain)., Historical Med    amiodarone (PACERONE) 200 MG tablet Take 1 tablet (200 mg total) by mouth daily., Starting Thu 11/12/2016, No Print    Aspirin-Caffeine (BC FAST PAIN RELIEF ARTHRITIS PO) Take 2 Packages by mouth 2 (two) times daily., Historical Med    Docusate Calcium (STOOL SOFTENER PO) Take 3 capsules by mouth daily. , Historical Med    furosemide (LASIX) 40 MG tablet Take 80 mg by mouth 2 (two) times daily., Historical Med    HYDROcodone-acetaminophen (NORCO/VICODIN) 5-325 MG tablet Take 1 tablet by mouth every 4 (four) hours  as needed., Starting Mon 11/23/2016, Print    hydrOXYzine (ATARAX/VISTARIL) 25 MG tablet Take 25 mg by mouth daily as needed for anxiety., Historical Med    metoprolol succinate (TOPROL XL) 25 MG 24 hr tablet Take 1 tablet (25 mg total) by mouth daily., Starting Fri 10/23/2016, Normal    potassium chloride SA (K-DUR,KLOR-CON) 20 MEQ tablet Take 1 tablet (20 mEq total) by mouth daily., Starting Mon 10/26/2016, Normal    rivaroxaban (XARELTO) 20 MG TABS tablet Take 1 tablet (20 mg total) by mouth daily with supper., Starting Fri 01/17/2016, Normal       Allergies  Allergen Reactions  . Tape Rash    Paper tape is preferred, please!!    Discharge Instructions    Ambulatory referral to Rheumatology    Complete by:  As directed    Diet - low sodium heart healthy    Complete by:  As directed    Increase activity slowly    Complete by:  As directed      Discharge Exam: Filed Weights   12/02/16 0500 12/03/16 0452 12/04/16 0529  Weight: 110.6 kg (243 lb 13.3 oz) 110 kg (242 lb 6.4 oz) 108.8 kg (239 lb 14.4 oz)   Vitals:   12/04/16 0623 12/04/16 1031  BP:  109/75  Pulse: (!) 116 (!) 117  Resp:  20  Temp:  97.3 F (36.3 C)   General: Appear in mild distress, right leg Rash; Oral Mucosa moist. Cardiovascular: S1 and S2 Present, no Murmur, no JVD Respiratory: Bilateral Air entry present and Clear to Auscultation, no Crackles, no wheezes Abdomen: Bowel Sound present, Soft and no tenderness Extremities: right Pedal edema, right calf tenderness Neurology: Grossly no focal neuro deficit.  The results of significant diagnostics from this hospitalization (including imaging, microbiology, ancillary and laboratory) are listed below for reference.    Significant Diagnostic Studies: Dg Chest 2 View  Result Date: 12/02/2016 CLINICAL DATA:  Shortness of breath with chest pain.  Hypertension. EXAM: CHEST  2 VIEW COMPARISON:  11/11/2016. FINDINGS: The heart is enlarged. There is mild vascular congestion. No active infiltrates or failure. LEFT pleural effusion. Slight worsening aeration. IMPRESSION: Cardiomegaly mild vascular congestion, with slight worsening compared with priors. Chronic LEFT pleural effusion. Electronically Signed   By: Staci Righter M.D.   On: 12/02/2016 13:16   Dg Chest 2 View  Result Date: 11/11/2016 CLINICAL DATA:  Shortness of breath. EXAM: CHEST  2 VIEW COMPARISON:  Radiographs of October 02, 2016. FINDINGS: Stable cardiomegaly. No pneumothorax is noted. Right lung is clear. Mild left loculated pleural effusion is noted which is significantly smaller compared to prior exam. No pulmonary consolidation is noted.  Bony thorax is unremarkable. IMPRESSION: Mild left loculated pleural effusion which is smaller compared to prior exam. Electronically Signed   By: Marijo Conception, M.D.   On: 11/11/2016 10:48   Mr Tibia Fibula Left Wo Contrast  Result Date: 12/04/2016 CLINICAL DATA:  Lower extremity swelling. EXAM: MRI OF LOWER LEFT EXTREMITY WITHOUT CONTRAST TECHNIQUE: Multiplanar, multisequence MR imaging of the left was performed. No intravenous contrast was administered. COMPARISON:  None. FINDINGS: Bones/Joint/Cartilage No marrow signal abnormality. No fracture or dislocation. Normal alignment. No joint effusion. Ligaments Collateral ligaments are intact. Muscles and Tendons 5.3 x 7.8 x 18 cm complex heterogeneous fluid collection in the left medial gastrocnemius muscle most consistent with an intramuscular hematoma with mass effect upon the remainder of the normal appearing media gastrocnemius muscle. Correlate with clinical exam for compartment syndrome. Small amount  of hemorrhage between the medial gastrocnemius muscle and soleus muscle extending obliquely towards the lateral femoral condyles as can be seen with a plantaris tendon tear. Soft tissue No fluid collection or hematoma.  No soft tissue mass. IMPRESSION: 1. 5.3 x 7.8 x 18 cm complex fluid collection in the left medial gastrocnemius muscle most consistent with an intramuscular hematoma with mass effect upon the remainder of the normal appearing media gastrocnemius muscle. Correlate with clinical exam for compartment syndrome. 2. Findings most consistent with a left plantaris tendon tear. Electronically Signed   By: Kathreen Devoid   On: 12/04/2016 08:35   Dg Shoulder Left  Result Date: 12/02/2016 CLINICAL DATA:  Acute left shoulder pain. EXAM: LEFT SHOULDER - 2+ VIEW COMPARISON:  None. FINDINGS: There is no evidence of fracture or dislocation. There is no evidence of arthropathy or other focal bone abnormality. Soft tissues are unremarkable. IMPRESSION: Negative.  Electronically Signed   By: Lorriane Shire M.D.   On: 12/02/2016 13:11   Dg Knee Complete 4 Views Left  Result Date: 11/11/2016 CLINICAL DATA:  Left knee swelling today. EXAM: LEFT KNEE - COMPLETE 4+ VIEW COMPARISON:  None. FINDINGS: Small to moderate joint effusion without fracture, malalignment, or erosion. Enthesophyte seen from the inferior pole patella. No marginal spurring or detected joint narrowing. IMPRESSION: Joint effusion without acute osseous finding or degenerative joint narrowing. Electronically Signed   By: Monte Fantasia M.D.   On: 11/11/2016 14:51   Dg Hip Unilat With Pelvis 2-3 Views Left  Result Date: 11/30/2016 CLINICAL DATA:  Pain and fever EXAM: DG HIP (WITH OR WITHOUT PELVIS) 2-3V LEFT COMPARISON:  None. FINDINGS: Frontal pelvis as well as frontal and lateral left hip images were obtained. There is no fracture or dislocation. There is mild symmetric narrowing of both hip joints. No erosive change. IMPRESSION: Mild symmetric narrowing of both hip joints. No fracture or dislocation. Electronically Signed   By: Lowella Grip III M.D.   On: 11/30/2016 08:01    Microbiology: Recent Results (from the past 240 hour(s))  Culture, blood (routine x 2)     Status: None   Collection Time: 11/29/16  9:35 PM  Result Value Ref Range Status   Specimen Description BLOOD SITE NOT SPECIFIED  Final   Special Requests   Final    BOTTLES DRAWN AEROBIC AND ANAEROBIC Blood Culture adequate volume   Culture NO GROWTH 5 DAYS  Final   Report Status 12/04/2016 FINAL  Final  Culture, blood (routine x 2)     Status: None   Collection Time: 11/29/16  9:35 PM  Result Value Ref Range Status   Specimen Description BLOOD RIGHT HAND  Final   Special Requests   Final    BOTTLES DRAWN AEROBIC AND ANAEROBIC Blood Culture adequate volume   Culture NO GROWTH 5 DAYS  Final   Report Status 12/04/2016 FINAL  Final  MRSA PCR Screening     Status: None   Collection Time: 11/30/16 12:09 AM  Result Value  Ref Range Status   MRSA by PCR NEGATIVE NEGATIVE Final    Comment:        The GeneXpert MRSA Assay (FDA approved for NASAL specimens only), is one component of a comprehensive MRSA colonization surveillance program. It is not intended to diagnose MRSA infection nor to guide or monitor treatment for MRSA infections.      Labs: CBC:  Recent Labs Lab 12/01/16 0233 12/02/16 0312 12/03/16 0350 12/04/16 0313  WBC 15.3* 16.1* 14.7* 12.6*  NEUTROABS  --   --  11.4*  --   HGB 8.5* 8.8* 8.7* 9.0*  HCT 28.3* 29.0* 28.6* 29.9*  MCV 85.0 84.5 83.6 83.1  PLT 345 385 455* 419*   Basic Metabolic Panel:  Recent Labs Lab 12/01/16 0233 12/02/16 0312 12/03/16 0350 12/04/16 0313  NA 131* 131* 136 136  K 4.4 4.8 4.5 4.5  CL 99* 97* 100* 99*  CO2 _0 GLUCOSE 143* 150* 121* 126*  BUN _1 CREATININE 0.66 0.76 0.73 0.60*  CALCIUM 8.1* 8.5* 8.3* 8.4*  MG  --   --  1.9  --    Liver Function Tests:  Recent Labs Lab 12/01/16 0233 12/03/16 0350  AST 17 17  ALT 16* 18  ALKPHOS 57 73  BILITOT 0.6 0.7  PROT 6.5 6.8  ALBUMIN 2.5* 2.3*   BNP (last 3 results)  Recent Labs  09/30/16 1222 11/11/16 1008 11/30/16 0023  BNP 153.5* 160.7* 149.6*   CBG:  Recent Labs Lab 11/30/16 1244 12/01/16 0530 12/02/16 0848 12/04/16 0811  GLUCAP 139* 101* 122* 117*   Time spent: 35 minutes  Signed:  Jovanie Verge  [facility] 12/04/2016 , 8:12 AM

## 2016-12-08 DIAGNOSIS — M2550 Pain in unspecified joint: Secondary | ICD-10-CM | POA: Diagnosis not present

## 2016-12-08 DIAGNOSIS — S8012XA Contusion of left lower leg, initial encounter: Secondary | ICD-10-CM | POA: Diagnosis not present

## 2016-12-08 DIAGNOSIS — F17200 Nicotine dependence, unspecified, uncomplicated: Secondary | ICD-10-CM | POA: Diagnosis not present

## 2016-12-08 LAB — HLA-B27 ANTIGEN: HLA-B27: NEGATIVE

## 2016-12-10 ENCOUNTER — Other Ambulatory Visit: Payer: Self-pay

## 2016-12-10 NOTE — Patient Outreach (Signed)
[facility]) Care Management  12/10/2016  Brian [name] [birth year] [id number]     EMMI- HF RED ON EMMI ALERT Day # 2 Date: 12/09/16 Red Alert Reason: "Any new problems? Yes" "New/worsening problems? Yes" "New swelling? Yes"   Outreach attempt # 1 to patient. Spoke with patient. States he is doing well since discharge form the hospital. He voices that his legs are getting better. He still has some occasional pain/discomfort but reports its minimal. Patient states on most days he is only tab a half a tab of his pain pill. He voices that he is scheduled for an ablation on 12/29/16. Reviewed and addressed red alerts with patient. He voices that he did not respond that way as he told the automated machine "no" to all those questions. Patient voices frustrations with dealing with automated machine and states he does not like it. He voices that he would like for automated calls to be discontinued. Patient was admitted to hospital for cellulitis of leg and not CHF. Stets his CHF is managed at present. He is weighing daily and denies any worsening of his condition. He states that he is doping fine. He has supportive sister in the home with him. He has completed his PCP f/u appt. No issues with meds. He denies any further RN CM needs or concerns at this time.        Plan: RN CM will notify [facility] administrative assistant of case status and to deactivate EMMI calls.    Enzo Montgomery, RN,BSN,CCM [place] Management Telephonic Care Management Coordinator Direct Phone: [phone] Toll Free: [phone] Fax: [phone]

## 2016-12-23 ENCOUNTER — Ambulatory Visit (INDEPENDENT_AMBULATORY_CARE_PROVIDER_SITE_OTHER): Payer: PPO | Admitting: Internal Medicine

## 2016-12-23 ENCOUNTER — Encounter: Payer: Self-pay | Admitting: Internal Medicine

## 2016-12-23 VITALS — BP 124/78 | HR 99 | Ht 70.0 in | Wt 239.8 lb

## 2016-12-23 DIAGNOSIS — I483 Typical atrial flutter: Secondary | ICD-10-CM

## 2016-12-23 DIAGNOSIS — D72829 Elevated white blood cell count, unspecified: Secondary | ICD-10-CM | POA: Diagnosis not present

## 2016-12-23 NOTE — Addendum Note (Signed)
Addended by: Janan Halter F on: 12/23/2016 02:00 PM   Modules accepted: Orders

## 2016-12-23 NOTE — Progress Notes (Signed)
Electrophysiology Office Note   Date:  12/23/2016   ID:  Brian [name] 1950-10-23, MRN [id number]  PCP:  Orpah Melter, MD   Primary Electrophysiologist: [name], MD    Chief Complaint  Patient presents with  . Atrial Flutter     History of Present Illness: Brian [name] is a 66 y.o. male who presents today for electrophysiology evaluation.   He presented to see me 11/18/16 (my note reviewed) and was scheduled for CTI ablation for typical atrial flutter.  He was subsequently admitted to [facility] 11/29/16 for sepsis/ cellulitis.  His procedure was cancelled with plans for him to follow-up with me for additional EP followup prior to ablation. He is doing well.  He has recovered from his infection.  He is ready for his atrial flutter ablation.  Today, he denies symptoms of palpitations, chest pain, shortness of breath, orthopnea, PND, lower extremity edema, claudication, dizziness, presyncope, syncope, bleeding, or neurologic sequela. The patient is tolerating medications without difficulties and is otherwise without complaint today.    Past Medical History:  Diagnosis Date  . Anxiety   . Arthritis   . Atrial flutter ([place])    a. 08/2012  . CHF (congestive heart failure) ([place])   . Gout   . Hypertension   . Obesity   . Pneumonia   . Shortness of breath   . Stroke ([place])   . Tobacco abuse    Past Surgical History:  Procedure Laterality Date  . CARDIOVERSION N/A 11/05/2016   Procedure: CARDIOVERSION;  Surgeon: Sueanne Margarita, MD;  Location: [facility];  Service: Cardiovascular;  Laterality: N/A;  . IR THORACENTESIS ASP PLEURAL SPACE W/IMG GUIDE  10/01/2016  . MICROLARYNGOSCOPY  09/02/2007   with excision of right vocal cord mass, Dr. Constance Holster     Current Outpatient Prescriptions  Medication Sig Dispense Refill  . acetaminophen (TYLENOL) 500 MG tablet Take 1,000 mg by mouth every 6 (six) hours as needed (pain).    Marland Kitchen amiodarone (PACERONE) 200 MG tablet Take 1  tablet (200 mg total) by mouth daily. 60 tablet 6  . Aspirin-Caffeine (BC FAST PAIN RELIEF ARTHRITIS PO) Take 2 Packages by mouth 2 (two) times daily as needed (pain).     . CHANTIX STARTING MONTH PAK 0.5 MG X 11 & 1 MG X 42 tablet as directed.    Mariane Baumgarten Calcium (STOOL SOFTENER PO) Take 3 capsules by mouth daily.     . famotidine (PEPCID) 40 MG tablet Take 0.5 tablets (20 mg total) by mouth daily. 30 tablet 0  . furosemide (LASIX) 40 MG tablet Take 80 mg by mouth 2 (two) times daily.    Marland Kitchen HYDROcodone-acetaminophen (NORCO/VICODIN) 5-325 MG tablet Take 1 tablet by mouth every 4 (four) hours as needed. 10 tablet 0  . hydrOXYzine (ATARAX/VISTARIL) 25 MG tablet Take 25 mg by mouth daily as needed for anxiety.    . metoprolol succinate (TOPROL XL) 25 MG 24 hr tablet Take 1 tablet (25 mg total) by mouth daily. 30 tablet 6  . nicotine (NICODERM CQ - DOSED IN MG/24 HOURS) 21 mg/24hr patch Place 1 patch (21 mg total) onto the skin daily. 28 patch 0  . potassium chloride SA (K-DUR,KLOR-CON) 20 MEQ tablet Take 1 tablet (20 mEq total) by mouth daily. 90 tablet 3  . predniSONE (DELTASONE) 20 MG tablet Take 10 mg by mouth daily with breakfast.  0  . rivaroxaban (XARELTO) 20 MG TABS tablet Take 1 tablet (20 mg total) by mouth  daily with supper. 30 tablet 3   No current facility-administered medications for this visit.     Allergies:   Tape   Social History:  The patient  reports that he has been smoking Cigarettes.  He has a 50.00 pack-year smoking history. He has never used smokeless tobacco. He reports that he does not drink alcohol or use drugs.   Family History:  The patient's  family history includes Cancer in his father and mother; Diabetes in his sister; Heart attack in his father; Heart disease in his father.    ROS:  Please see the history of present illness.   All other systems are personally reviewed and negative.    PHYSICAL EXAM: VS:  BP 124/78   Pulse 99   Ht 5\' 10"  (1.778 m)   Wt  239 lb 12.8 oz (108.8 kg)   SpO2 96%   BMI 34.41 kg/m  , BMI Body mass index is 34.41 kg/m. GEN: Well nourished, well developed, in no acute distress  HEENT: normal  Neck: no JVD, carotid bruits, or masses Cardiac: iRRR; no murmurs, rubs, or gallops,no edema  Respiratory:  clear to auscultation bilaterally, normal work of breathing GI: soft, nontender, nondistended, + BS MS: no deformity or atrophy  Skin: warm and dry  Neuro:  Strength and sensation are intact Psych: euthymic mood, full affect  EKG:  EKG is ordered today. The ekg ordered today is personally reviewed and shows typical appearing atrial flutter   Recent Labs: 11/30/2016: B Natriuretic Peptide 149.6; TSH 1.994 12/03/2016: ALT 18; Magnesium 1.9 12/04/2016: BUN 16; Creatinine, Ser 0.60; Hemoglobin 9.0; Platelets 501; Potassium 4.5; Sodium 136  personally reviewed   Lipid Panel     Component Value Date/Time   CHOL 135 09/28/2015 0402   TRIG 126 09/28/2015 0402   HDL 34 (L) 09/28/2015 0402   CHOLHDL 4.0 09/28/2015 0402   VLDL 25 09/28/2015 0402   LDLCALC 76 09/28/2015 0402   personally reviewed   Wt Readings from Last 3 Encounters:  12/23/16 239 lb 12.8 oz (108.8 kg)  12/04/16 239 lb 14.4 oz (108.8 kg)  11/23/16 233 lb (105.7 kg)      Other studies personally reviewed: Additional studies/ records that were reviewed today include: recent hospital records  Review of the above records today demonstrates: as above   ASSESSMENT AND PLAN:  1.  Typical atrial flutter Symptomatic typical atrial flutter Continue xarelto without interruption Stop amiodarone post ablation Will monitor clinically for afib post ablation (not previously documented) to determine if he needs long term anticoagulation. Therapeutic strategies for atrial flutter including medicine and ablation were discussed in detail with the patient today. Risk, benefits, and alternatives to EP study and radiofrequency ablation were also discussed in detail  today. These risks include but are not limited to stroke, bleeding, vascular damage, tamponade, perforation, damage to the heart and other structures, AV block requiring pacemaker, worsening renal function, and death. The patient understands these risk and wishes to proceed.  We will therefore proceed with catheter ablation at the next available time.  carto and anesthesia are requested  Today, I have spent 25 minutes with the patient discussing atrial flutter ablation.  More than 50% of the visit time today was spent on this issue.     Army Fossa, MD  12/23/2016 9:07 AM     [facility] [address] [place] [address] [phone] (office) [phone] (fax)

## 2016-12-23 NOTE — Patient Instructions (Addendum)
Medication Instructions:  Your physician recommends that you continue on your current medications as directed. Please refer to the Current Medication list given to you today.   Labwork:  Your physician recommends that you have lab work: CBC/BMP to be drawn at PCP today at 1:30.  Please fax results to [phone] Atn: Dr Rayann Heman   Testing/Procedures: Your physician has recommended that you have an ablation. Catheter ablation is a medical procedure used to treat some cardiac arrhythmias (irregular heartbeats). During catheter ablation, a long, thin, flexible tube is put into a blood vessel in your groin (upper thigh), or neck. This tube is called an ablation catheter. It is then guided to your heart through the blood vessel. Radio frequency waves destroy small areas of heart tissue where abnormal heartbeats may cause an arrhythmia to start. Please see the instruction sheet given to you today.---12/29/16  Please arrive at [place] [facility] at 5:30am Do not eat or drink after midnight the night prior to the procedure Do not take any medications the morning of the test Plan for one night stay Will need someone to drive you home at discharge    Follow-Up: Your physician recommends that you schedule a follow-up appointment in: 5 weeks with Dr Rayann Heman   Any Other Special Instructions Will Be Listed Below (If Applicable).     If you need a refill on your cardiac medications before your next appointment, please call your pharmacy.

## 2016-12-24 LAB — BASIC METABOLIC PANEL
BUN/Creatinine Ratio: 29 — ABNORMAL HIGH (ref 10–24)
BUN: 20 mg/dL (ref 8–27)
CO2: 29 mmol/L (ref 20–29)
Calcium: 9.1 mg/dL (ref 8.6–10.2)
Chloride: 98 mmol/L (ref 96–106)
Creatinine, Ser: 0.7 mg/dL — ABNORMAL LOW (ref 0.76–1.27)
GFR calc Af Amer: 114 mL/min/{1.73_m2} (ref >59)
GFR calc non Af Amer: 99 mL/min/{1.73_m2} (ref >59)
GLUCOSE: 94 mg/dL (ref 65–99)
POTASSIUM: 4.9 mmol/L (ref 3.5–5.2)
Sodium: 142 mmol/L (ref 134–144)

## 2016-12-28 ENCOUNTER — Ambulatory Visit: Payer: PPO | Admitting: Internal Medicine

## 2016-12-28 NOTE — Anesthesia Preprocedure Evaluation (Addendum)
Anesthesia Evaluation  Patient identified by MRN, date of birth, ID band Patient awake    Reviewed: Allergy & Precautions, H&P , NPO status , Patient's Chart, lab work & pertinent test results  History of Anesthesia Complications Negative for: history of anesthetic complications  Airway Mallampati: II   Neck ROM: full    Dental  (+) Edentulous Lower, Edentulous Upper, Dental Advisory Given   Pulmonary shortness of breath, Current Smoker,    breath sounds clear to auscultation       Cardiovascular hypertension, +CHF  + dysrhythmias Atrial Fibrillation  Rhythm:irregular Rate:Normal  Study Conclusions  - Left ventricle: The cavity size was normal. Systolic function was   normal. The estimated ejection fraction was in the range of 55%   to 60%. Wall motion was normal; there were no regional wall   motion abnormalities. Left ventricular diastolic function   parameters were normal.   Neuro/Psych PSYCHIATRIC DISORDERS Anxiety TIA   GI/Hepatic negative GI ROS, Neg liver ROS,   Endo/Other  Morbid obesity  Renal/GU negative Renal ROS     Musculoskeletal  (+) Arthritis ,   Abdominal   Peds  Hematology   Anesthesia Other Findings   Reproductive/Obstetrics                            Anesthesia Physical  Anesthesia Plan  ASA: III  Anesthesia Plan: MAC   Post-op Pain Management:    Induction: Intravenous  PONV Risk Score and Plan: 2 and Ondansetron and Dexamethasone  Airway Management Planned: Mask  Additional Equipment:   Intra-op Plan:   Post-operative Plan:   Informed Consent: I have reviewed the patients History and Physical, chart, labs and discussed the procedure including the risks, benefits and alternatives for the proposed anesthesia with the patient or authorized representative who has indicated his/her understanding and acceptance.   Dental advisory given  Plan Discussed  with: Anesthesiologist  Anesthesia Plan Comments:        Anesthesia Quick Evaluation

## 2016-12-29 ENCOUNTER — Encounter (HOSPITAL_COMMUNITY): Payer: Self-pay | Admitting: Anesthesiology

## 2016-12-29 ENCOUNTER — Encounter (HOSPITAL_COMMUNITY)
Admission: RE | Disposition: A | Discharge status: Home / Self Care | Payer: Self-pay | Source: Ambulatory Visit | Attending: Internal Medicine

## 2016-12-29 ENCOUNTER — Ambulatory Visit (HOSPITAL_COMMUNITY)
Admission: RE | Admit: 2016-12-29 | Discharge: 2016-12-29 | Disposition: A | Discharge status: Home / Self Care | Payer: PPO | Source: Ambulatory Visit | Attending: Internal Medicine | Admitting: Internal Medicine

## 2016-12-29 ENCOUNTER — Ambulatory Visit (HOSPITAL_COMMUNITY): Payer: PPO | Admitting: Certified Registered Nurse Anesthetist

## 2016-12-29 DIAGNOSIS — Z7982 Long term (current) use of aspirin: Secondary | ICD-10-CM | POA: Diagnosis not present

## 2016-12-29 DIAGNOSIS — I110 Hypertensive heart disease with heart failure: Secondary | ICD-10-CM | POA: Diagnosis not present

## 2016-12-29 DIAGNOSIS — Z8673 Personal history of transient ischemic attack (TIA), and cerebral infarction without residual deficits: Secondary | ICD-10-CM | POA: Diagnosis not present

## 2016-12-29 DIAGNOSIS — I483 Typical atrial flutter: Secondary | ICD-10-CM | POA: Diagnosis not present

## 2016-12-29 DIAGNOSIS — Z8249 Family history of ischemic heart disease and other diseases of the circulatory system: Secondary | ICD-10-CM | POA: Diagnosis not present

## 2016-12-29 DIAGNOSIS — F17210 Nicotine dependence, cigarettes, uncomplicated: Secondary | ICD-10-CM | POA: Diagnosis not present

## 2016-12-29 DIAGNOSIS — Z7901 Long term (current) use of anticoagulants: Secondary | ICD-10-CM | POA: Insufficient documentation

## 2016-12-29 DIAGNOSIS — Z6833 Body mass index (BMI) 33.0-33.9, adult: Secondary | ICD-10-CM | POA: Insufficient documentation

## 2016-12-29 DIAGNOSIS — I480 Paroxysmal atrial fibrillation: Secondary | ICD-10-CM | POA: Insufficient documentation

## 2016-12-29 DIAGNOSIS — F419 Anxiety disorder, unspecified: Secondary | ICD-10-CM | POA: Insufficient documentation

## 2016-12-29 DIAGNOSIS — E669 Obesity, unspecified: Secondary | ICD-10-CM | POA: Insufficient documentation

## 2016-12-29 DIAGNOSIS — I509 Heart failure, unspecified: Secondary | ICD-10-CM | POA: Insufficient documentation

## 2016-12-29 HISTORY — PX: A-FLUTTER ABLATION: EP1230

## 2016-12-29 LAB — CBC
HEMATOCRIT: 33 % — AB (ref 39.0–52.0)
HEMOGLOBIN: 9.6 g/dL — AB (ref 13.0–17.0)
MCH: 24.5 pg — ABNORMAL LOW (ref 26.0–34.0)
MCHC: 29.1 g/dL — ABNORMAL LOW (ref 30.0–36.0)
MCV: 84.2 fL (ref 78.0–100.0)
Platelets: 350 10*3/uL (ref 150–400)
RBC: 3.92 MIL/uL — AB (ref 4.22–5.81)
RDW: 16.2 % — ABNORMAL HIGH (ref 11.5–15.5)
WBC: 12.2 10*3/uL — AB (ref 4.0–10.5)

## 2016-12-29 SURGERY — A-FLUTTER ABLATION
Anesthesia: Monitor Anesthesia Care

## 2016-12-29 MED ORDER — LIDOCAINE HCL (CARDIAC) 20 MG/ML IV SOLN
INTRAVENOUS | Status: DC | PRN
  Administered 2016-12-29: 50 mg via INTRAVENOUS

## 2016-12-29 MED ORDER — SODIUM CHLORIDE 0.9 % IV SOLN
INTRAVENOUS | Status: DC
  Administered 2016-12-29: 07:00:00 via INTRAVENOUS

## 2016-12-29 MED ORDER — ONDANSETRON HCL 4 MG/2ML IJ SOLN
INTRAMUSCULAR | Status: DC | PRN
  Administered 2016-12-29: 4 mg via INTRAVENOUS

## 2016-12-29 MED ORDER — FENTANYL CITRATE (PF) 100 MCG/2ML IJ SOLN
INTRAMUSCULAR | Status: DC | PRN
  Administered 2016-12-29: 50 ug via INTRAVENOUS
  Administered 2016-12-29: 100 ug via INTRAVENOUS
  Administered 2016-12-29: 50 ug via INTRAVENOUS

## 2016-12-29 MED ORDER — SODIUM CHLORIDE 0.9 % IV SOLN: 250.0000 mL | INTRAVENOUS | Status: DC | PRN

## 2016-12-29 MED ORDER — MIDAZOLAM HCL 5 MG/5ML IJ SOLN
INTRAMUSCULAR | Status: DC | PRN
  Administered 2016-12-29: 2 mg via INTRAVENOUS

## 2016-12-29 MED ORDER — PROMETHAZINE HCL 25 MG/ML IJ SOLN: 6.2500 mg | INTRAMUSCULAR | Status: DC | PRN

## 2016-12-29 MED ORDER — PROPOFOL 500 MG/50ML IV EMUL
INTRAVENOUS | Status: DC | PRN
  Administered 2016-12-29: 100 ug/kg/min via INTRAVENOUS

## 2016-12-29 MED ORDER — SODIUM CHLORIDE 0.9% FLUSH: 3.0000 mL | Freq: Two times a day (BID) | INTRAVENOUS | Status: DC

## 2016-12-29 MED ORDER — BUPIVACAINE HCL (PF) 0.25 % IJ SOLN
INTRAMUSCULAR | Status: AC
  Filled 2016-12-29: qty 30

## 2016-12-29 MED ORDER — PROPOFOL 10 MG/ML IV BOLUS
INTRAVENOUS | Status: DC | PRN
  Administered 2016-12-29: 50 mg via INTRAVENOUS

## 2016-12-29 MED ORDER — HYDROMORPHONE HCL 1 MG/ML IJ SOLN: 0.2500 mg | INTRAMUSCULAR | Status: DC | PRN

## 2016-12-29 MED ORDER — HYDROCODONE-ACETAMINOPHEN 5-325 MG PO TABS: 1.0000 | ORAL_TABLET | ORAL | Status: DC | PRN

## 2016-12-29 MED ORDER — BUPIVACAINE HCL (PF) 0.25 % IJ SOLN
INTRAMUSCULAR | Status: DC | PRN
  Administered 2016-12-29: 20 mL

## 2016-12-29 MED ORDER — SODIUM CHLORIDE 0.9% FLUSH: 3.0000 mL | INTRAVENOUS | Status: DC | PRN

## 2016-12-29 MED ORDER — DEXAMETHASONE SODIUM PHOSPHATE 10 MG/ML IJ SOLN
INTRAMUSCULAR | Status: DC | PRN
  Administered 2016-12-29: 10 mg via INTRAVENOUS

## 2016-12-29 SURGICAL SUPPLY — 11 items
BAG SNAP BAND KOVER 36X36 (MISCELLANEOUS) ×3 IMPLANT
BLANKET WARM UNDERBOD FULL ACC (MISCELLANEOUS) ×3 IMPLANT
CATH EZ STEER NAV 8MM F-J CUR (ABLATOR) ×3 IMPLANT
CATH WEBSTER BI DIR CS D-F CRV (CATHETERS) ×3 IMPLANT
PACK EP LATEX FREE (CUSTOM PROCEDURE TRAY) ×2
PACK EP LF (CUSTOM PROCEDURE TRAY) ×1 IMPLANT
PAD DEFIB LIFELINK (PAD) ×3 IMPLANT
PATCH CARTO3 (PAD) ×3 IMPLANT
SHEATH PINNACLE 6F 10CM (SHEATH) IMPLANT
SHEATH PINNACLE 7F 10CM (SHEATH) ×6 IMPLANT
SHEATH PINNACLE 8F 10CM (SHEATH) ×3 IMPLANT

## 2016-12-29 NOTE — Interval H&P Note (Signed)
History and Physical Interval Note:  12/29/2016 7:17 AM  Brian [name]  has presented today for surgery, with the diagnosis of aflutter  The various methods of treatment have been discussed with the patient and family. After consideration of risks, benefits and other options for treatment, the patient has consented to  Procedure(s): A-Flutter Ablation (N/A) as a surgical intervention .  The patient's history has been reviewed, patient examined, no change in status, stable for surgery.  I have reviewed the patient's chart and labs.  Questions were answered to the patient's satisfaction.     [name]

## 2016-12-29 NOTE — Discharge Instructions (Signed)
No driving for 4 days. No lifting over 5 lbs for 1 week. No sexual activity for 1 week. You may return to work in 1 week. Keep procedure site clean & dry. If you notice increased pain, swelling, bleeding or pus, call/return!  You may shower, but no soaking baths/hot tubs/pools for 1 week.   Ok to return to work without restrictions in 1 week.   Angiogram, Care After This sheet gives you information about how to care for yourself after your procedure. Your health care provider may also give you more specific instructions. If you have problems or questions, contact your health care provider. What can I expect after the procedure? After the procedure, it is common to have bruising and tenderness at the catheter insertion area. Follow these instructions at home: Insertion site care  Follow instructions from your health care provider about how to take care of your insertion site. Make sure you: ? Wash your hands with soap and water before you change your bandage (dressing). If soap and water are not available, use hand sanitizer. ? Change your dressing as told by your health care provider. ? Leave stitches (sutures), skin glue, or adhesive strips in place. These skin closures may need to stay in place for 2 weeks or longer. If adhesive strip edges start to loosen and curl up, you may trim the loose edges. Do not remove adhesive strips completely unless your health care provider tells you to do that.  Do not take baths, swim, or use a hot tub until your health care provider approves.  You may shower 24-48 hours after the procedure or as told by your health care provider. ? Gently wash the site with plain soap and water. ? Pat the area dry with a clean towel. ? Do not rub the site. This may cause bleeding.  Do not apply powder or lotion to the site. Keep the site clean and dry.  Check your insertion site every day for signs of infection. Check for: ? Redness, swelling, or pain. ? Fluid or  blood. ? Warmth. ? Pus or a bad smell. Activity  Rest as told by your health care provider, usually for 1-2 days.  Do not lift anything that is heavier than 10 lbs. (4.5 kg) or as told by your health care provider.  Do not drive for 24 hours if you were given a medicine to help you relax (sedative).  Do not drive or use heavy machinery while taking prescription pain medicine. General instructions  Return to your normal activities as told by your health care provider, usually in about a week. Ask your health care provider what activities are safe for you.  If the catheter site starts bleeding, lie flat and put pressure on the site. If the bleeding does not stop, get help right away. This is a medical emergency.  Drink enough fluid to keep your urine clear or pale yellow. This helps flush the contrast dye from your body.  Take over-the-counter and prescription medicines only as told by your health care provider.  Keep all follow-up visits as told by your health care provider. This is important. Contact a health care provider if:  You have a fever or chills.  You have redness, swelling, or pain around your insertion site.  You have fluid or blood coming from your insertion site.  The insertion site feels warm to the touch.  You have pus or a bad smell coming from your insertion site.  You have bruising around  the insertion site.  You notice blood collecting in the tissue around the catheter site (hematoma). The hematoma may be painful to the touch. Get help right away if:  You have severe pain at the catheter insertion area.  The catheter insertion area swells very fast.  The catheter insertion area is bleeding, and the bleeding does not stop when you hold steady pressure on the area.  The area near or just beyond the catheter insertion site becomes pale, cool, tingly, or numb. These symptoms may represent a serious problem that is an emergency. Do not wait to see if the  symptoms will go away. Get medical help right away. Call your local emergency services (911 in the [place] Do not drive yourself to the hospital. Summary  After the procedure, it is common to have bruising and tenderness at the catheter insertion area.  After the procedure, it is important to rest and drink plenty of fluids.  Do not take baths, swim, or use a hot tub until your health care provider says it is okay to do so. You may shower 24-48 hours after the procedure or as told by your health care provider.  If the catheter site starts bleeding, lie flat and put pressure on the site. If the bleeding does not stop, get help right away. This is a medical emergency. This information is not intended to replace advice given to you by your health care provider. Make sure you discuss any questions you have with your health care provider. Document Released: 01/01/2005 Document Revised: 05/20/2016 Document Reviewed: 05/20/2016 Elsevier Interactive Patient Education  2017 [employer].

## 2016-12-29 NOTE — Progress Notes (Signed)
Site area: rt groin 3 fv sheaths Site Prior to Removal:  Level 0 Pressure Applied For: 20 minutes  Manual:   yes Patient Status During Pull:  stable Post Pull Site:  Level 0 Post Pull Instructions Given:  yes Post Pull Pulses Present: palpable Dressing Applied:  Gauze and tegaderm Bedrest begins @  1010 Comments:  IV saline locked

## 2016-12-29 NOTE — Discharge Summary (Signed)
ELECTROPHYSIOLOGY PROCEDURE DISCHARGE SUMMARY    Patient ID: Brian [name],  MRN: [id number], DOB/AGE: 09-16-1950 66 y.o.  Admit date: 12/29/2016 Discharge date: 12/29/2016  Primary Care Physician: Orpah Melter, MD Electrophysiologist: Shawnta Zimbelman  Primary Discharge Diagnosis:  Atrial flutter status post ablation this admission  Secondary Discharge Diagnosis:  1.  HTN 2.  Prior CVA 3.  Obesity  Allergies  Allergen Reactions  . Tape Rash    Paper tape is preferred, please!!     Procedures This Admission: 1.  Electrophysiology study and radiofrequency catheter ablation on 12/29/16 by Dr Rayann Heman.  This study demonstrated typical atrial flutter with successful CTI ablation.  There were no inducible arrhythmias following ablation and no early apparent complications.   Brief HPI: Brian [name] is a 66 y.o. male with a past medical history as outlined above.  He has documented atrial flutter. He has been appropriately anticoagulated for >4 weeks.  Risks, benefits, and alternatives to ablation were reviewed with the patient who wished to proceed.   Hospital Course:  The patient was admitted and underwent EPS/RFCA with details as outlined above. He was monitored on telemetry during bedrest which demonstrated sinus rhythm.  Groin incision was without complication.  They were examined by Dr Rayann Heman who considered them stable for discharge to home.  Follow up will be arranged in 4 weeks.  Wound care and restrictions were reviewed with the patient prior to discharge.   Physical Exam: Vitals:   12/29/16 1045 12/29/16 1100 12/29/16 1130 12/29/16 1200  BP: (!) 146/72 (!) 151/76 (!) 142/63 (!) 177/86  Pulse: 80 81 80 88  Resp: 18 (!) 24 (!) 22 17  Temp:      TempSrc:      SpO2: 99% 97% 98% 99%  Weight:      Height:        GEN- The patient is well appearing, alert and oriented x 3 today.   HEENT: normocephalic, atraumatic; sclera clear, conjunctiva pink; hearing intact; oropharynx  clear; neck supple  Lungs- Clear to ausculation bilaterally, normal work of breathing.  No wheezes, rales, rhonchi Heart- Regular rate and rhythm  GI- soft, non-tender, non-distended, bowel sounds present  Extremities- no clubbing, cyanosis, or edema; DP/PT/radial pulses 2+ bilaterally, groin without hematoma/bruit MS- no significant deformity or atrophy Skin- warm and dry, no rash or lesion Psych- euthymic mood, full affect Neuro- strength and sensation are intact   Labs:   Lab Results  Component Value Date   WBC 12.2 (H) 12/29/2016   HGB 9.6 (L) 12/29/2016   HCT 33.0 (L) 12/29/2016   MCV 84.2 12/29/2016   PLT 350 12/29/2016     Recent Labs Lab 12/23/16 0232  NA 142  K 4.9  CL 98  CO2 29  BUN 20  CREATININE 0.70*  CALCIUM 9.1  GLUCOSE 94    Discharge Medications:  Allergies as of 12/29/2016      Reactions   Tape Rash   Paper tape is preferred, please!!      Medication List    STOP taking these medications   amiodarone 200 MG tablet Commonly known as:  PACERONE     TAKE these medications   acetaminophen 500 MG tablet Commonly known as:  TYLENOL Take 1,000 mg by mouth every 6 (six) hours as needed (pain).   BC FAST PAIN RELIEF ARTHRITIS PO Take 2 Packages by mouth 2 (two) times daily as needed (pain).   CHANTIX STARTING MONTH PAK 0.5 MG X 11 & 1  MG X 42 tablet Generic drug:  varenicline as directed.   famotidine 40 MG tablet Commonly known as:  PEPCID Take 0.5 tablets (20 mg total) by mouth daily.   furosemide 40 MG tablet Commonly known as:  LASIX Take 80 mg by mouth 2 (two) times daily.   HYDROcodone-acetaminophen 5-325 MG tablet Commonly known as:  NORCO/VICODIN Take 1 tablet by mouth every 4 (four) hours as needed.   hydrOXYzine 25 MG tablet Commonly known as:  ATARAX/VISTARIL Take 25 mg by mouth daily as needed for anxiety.   metoprolol succinate 25 MG 24 hr tablet Commonly known as:  TOPROL XL Take 1 tablet (25 mg total) by mouth  daily.   potassium chloride SA 20 MEQ tablet Commonly known as:  K-DUR,KLOR-CON Take 1 tablet (20 mEq total) by mouth daily.   predniSONE 20 MG tablet Commonly known as:  DELTASONE Take 10 mg by mouth daily with breakfast.   rivaroxaban 20 MG Tabs tablet Commonly known as:  XARELTO Take 1 tablet (20 mg total) by mouth daily with supper.   STOOL SOFTENER PO Take 3 capsules by mouth daily.       Disposition:  Discharge Instructions    Diet - low sodium heart healthy    Complete by:  As directed    Diet regular    Complete by:  As directed    Increase activity slowly    Complete by:  As directed      Follow-up Information    [name], MD Follow up on 02/01/2017.   Specialty:  Cardiology Why:  at 8:45AM Contact information: [place] [address] [phone]           Duration of Discharge Encounter: Greater than 30 minutes including physician time.  Signed, Chanetta Marshall, NP 12/29/2016 2:55 PM     [name] MD, [facility] 12/29/2016 5:03 PM

## 2016-12-29 NOTE — Transfer of Care (Signed)
Immediate Anesthesia Transfer of Care Note  Patient: Brian [name]  Procedure(s) Performed: Procedure(s): A-Flutter Ablation (N/A)  Patient Location: Cath Lab  Anesthesia Type:MAC  Level of Consciousness: awake, alert , oriented and patient cooperative  Airway & Oxygen Therapy: Patient Spontanous Breathing  Post-op Assessment: Report given to RN and Post -op Vital signs reviewed and stable  Post vital signs: Reviewed and stable  Last Vitals:  Vitals:   12/29/16 0547  BP: (!) 146/73  Pulse: 82  Temp: 36.6 C    Last Pain:  Vitals:   12/29/16 0547  TempSrc: Oral      Patients Stated Pain Goal: 4 (37/62/83 1517)  Complications: No apparent anesthesia complications

## 2016-12-29 NOTE — Anesthesia Procedure Notes (Signed)
Procedure Name: MAC Date/Time: 12/29/2016 7:40 AM Performed by: Jacquiline Doe A Pre-anesthesia Checklist: Patient identified, Emergency Drugs available, Suction available and Patient being monitored Patient Re-evaluated:Patient Re-evaluated prior to inductionOxygen Delivery Method: Simple face mask Intubation Type: IV induction Placement Confirmation: positive ETCO2 Dental Injury: Teeth and Oropharynx as per pre-operative assessment

## 2016-12-29 NOTE — H&P (View-Only) (Signed)
Electrophysiology Office Note   Date:  12/23/2016   ID:  Brian [name], Brian [name] March 01, 1951, MRN [id number]  PCP:  Orpah Melter, MD   Primary Electrophysiologist: [name], MD    Chief Complaint  Patient presents with  . Atrial Flutter     History of Present Illness: Brian [name] is a 66 y.o. male who presents today for electrophysiology evaluation.   He presented to see me 11/18/16 (my note reviewed) and was scheduled for CTI ablation for typical atrial flutter.  He was subsequently admitted to [facility] 11/29/16 for sepsis/ cellulitis.  His procedure was cancelled with plans for him to follow-up with me for additional EP followup prior to ablation. He is doing well.  He has recovered from his infection.  He is ready for his atrial flutter ablation.  Today, he denies symptoms of palpitations, chest pain, shortness of breath, orthopnea, PND, lower extremity edema, claudication, dizziness, presyncope, syncope, bleeding, or neurologic sequela. The patient is tolerating medications without difficulties and is otherwise without complaint today.    Past Medical History:  Diagnosis Date  . Anxiety   . Arthritis   . Atrial flutter ([place])    a. 08/2012  . CHF (congestive heart failure) ([place])   . Gout   . Hypertension   . Obesity   . Pneumonia   . Shortness of breath   . Stroke ([place])   . Tobacco abuse    Past Surgical History:  Procedure Laterality Date  . CARDIOVERSION N/A 11/05/2016   Procedure: CARDIOVERSION;  Surgeon: Sueanne Margarita, MD;  Location: [facility];  Service: Cardiovascular;  Laterality: N/A;  . IR THORACENTESIS ASP PLEURAL SPACE W/IMG GUIDE  10/01/2016  . MICROLARYNGOSCOPY  09/02/2007   with excision of right vocal cord mass, Dr. Constance Holster     Current Outpatient Prescriptions  Medication Sig Dispense Refill  . acetaminophen (TYLENOL) 500 MG tablet Take 1,000 mg by mouth every 6 (six) hours as needed (pain).    Marland Kitchen amiodarone (PACERONE) 200 MG tablet Take 1  tablet (200 mg total) by mouth daily. 60 tablet 6  . Aspirin-Caffeine (BC FAST PAIN RELIEF ARTHRITIS PO) Take 2 Packages by mouth 2 (two) times daily as needed (pain).     . CHANTIX STARTING MONTH PAK 0.5 MG X 11 & 1 MG X 42 tablet as directed.    Mariane Baumgarten Calcium (STOOL SOFTENER PO) Take 3 capsules by mouth daily.     . famotidine (PEPCID) 40 MG tablet Take 0.5 tablets (20 mg total) by mouth daily. 30 tablet 0  . furosemide (LASIX) 40 MG tablet Take 80 mg by mouth 2 (two) times daily.    Marland Kitchen HYDROcodone-acetaminophen (NORCO/VICODIN) 5-325 MG tablet Take 1 tablet by mouth every 4 (four) hours as needed. 10 tablet 0  . hydrOXYzine (ATARAX/VISTARIL) 25 MG tablet Take 25 mg by mouth daily as needed for anxiety.    . metoprolol succinate (TOPROL XL) 25 MG 24 hr tablet Take 1 tablet (25 mg total) by mouth daily. 30 tablet 6  . nicotine (NICODERM CQ - DOSED IN MG/24 HOURS) 21 mg/24hr patch Place 1 patch (21 mg total) onto the skin daily. 28 patch 0  . potassium chloride SA (K-DUR,KLOR-CON) 20 MEQ tablet Take 1 tablet (20 mEq total) by mouth daily. 90 tablet 3  . predniSONE (DELTASONE) 20 MG tablet Take 10 mg by mouth daily with breakfast.  0  . rivaroxaban (XARELTO) 20 MG TABS tablet Take 1 tablet (20 mg total) by mouth  daily with supper. 30 tablet 3   No current facility-administered medications for this visit.     Allergies:   Tape   Social History:  The patient  reports that he has been smoking Cigarettes.  He has a 50.00 pack-year smoking history. He has never used smokeless tobacco. He reports that he does not drink alcohol or use drugs.   Family History:  The patient's  family history includes Cancer in his father and mother; Diabetes in his sister; Heart attack in his father; Heart disease in his father.    ROS:  Please see the history of present illness.   All other systems are personally reviewed and negative.    PHYSICAL EXAM: VS:  BP 124/78   Pulse 99   Ht 5\' 10"  (1.778 m)   Wt  239 lb 12.8 oz (108.8 kg)   SpO2 96%   BMI 34.41 kg/m  , BMI Body mass index is 34.41 kg/m. GEN: Well nourished, well developed, in no acute distress  HEENT: normal  Neck: no JVD, carotid bruits, or masses Cardiac: iRRR; no murmurs, rubs, or gallops,no edema  Respiratory:  clear to auscultation bilaterally, normal work of breathing GI: soft, nontender, nondistended, + BS MS: no deformity or atrophy  Skin: warm and dry  Neuro:  Strength and sensation are intact Psych: euthymic mood, full affect  EKG:  EKG is ordered today. The ekg ordered today is personally reviewed and shows typical appearing atrial flutter   Recent Labs: 11/30/2016: B Natriuretic Peptide 149.6; TSH 1.994 12/03/2016: ALT 18; Magnesium 1.9 12/04/2016: BUN 16; Creatinine, Ser 0.60; Hemoglobin 9.0; Platelets 501; Potassium 4.5; Sodium 136  personally reviewed   Lipid Panel     Component Value Date/Time   CHOL 135 09/28/2015 0402   TRIG 126 09/28/2015 0402   HDL 34 (L) 09/28/2015 0402   CHOLHDL 4.0 09/28/2015 0402   VLDL 25 09/28/2015 0402   LDLCALC 76 09/28/2015 0402   personally reviewed   Wt Readings from Last 3 Encounters:  12/23/16 239 lb 12.8 oz (108.8 kg)  12/04/16 239 lb 14.4 oz (108.8 kg)  11/23/16 233 lb (105.7 kg)      Other studies personally reviewed: Additional studies/ records that were reviewed today include: recent hospital records  Review of the above records today demonstrates: as above   ASSESSMENT AND PLAN:  1.  Typical atrial flutter Symptomatic typical atrial flutter Continue xarelto without interruption Stop amiodarone post ablation Will monitor clinically for afib post ablation (not previously documented) to determine if he needs long term anticoagulation. Therapeutic strategies for atrial flutter including medicine and ablation were discussed in detail with the patient today. Risk, benefits, and alternatives to EP study and radiofrequency ablation were also discussed in detail  today. These risks include but are not limited to stroke, bleeding, vascular damage, tamponade, perforation, damage to the heart and other structures, AV block requiring pacemaker, worsening renal function, and death. The patient understands these risk and wishes to proceed.  We will therefore proceed with catheter ablation at the next available time.  carto and anesthesia are requested  Today, I have spent 25 minutes with the patient discussing atrial flutter ablation.  More than 50% of the visit time today was spent on this issue.     Army Fossa, MD  12/23/2016 9:07 AM     [facility] [address] [place]  [place] [address] [phone] (office) [phone] (fax)

## 2016-12-31 ENCOUNTER — Encounter (HOSPITAL_COMMUNITY): Payer: Self-pay | Admitting: Internal Medicine

## 2017-01-01 NOTE — Anesthesia Postprocedure Evaluation (Signed)
Anesthesia Post Note  Patient: Brian [name]  Procedure(s) Performed: Procedure(s) (LRB): A-Flutter Ablation (N/A)     Patient location during evaluation: PACU Anesthesia Type: MAC Level of consciousness: awake and alert Pain management: pain level controlled Vital Signs Assessment: post-procedure vital signs reviewed and stable Respiratory status: spontaneous breathing, nonlabored ventilation, respiratory function stable and patient connected to nasal cannula oxygen Cardiovascular status: stable Anesthetic complications: no    Last Vitals:  Vitals:   12/29/16 1545 12/29/16 1600  BP: (!) 147/63 (!) 155/71  Pulse: 86 93  Resp: 18 (!) 22  Temp:      Last Pain:  Vitals:   12/29/16 1021  TempSrc: Oral                 Glennie Rodda

## 2017-01-13 ENCOUNTER — Telehealth (INDEPENDENT_AMBULATORY_CARE_PROVIDER_SITE_OTHER): Payer: Self-pay | Admitting: *Deleted

## 2017-01-13 NOTE — Telephone Encounter (Signed)
Pt called saw Dr. Erlinda Hong about a month ago and he drained fluid out of Left knee, states some of the swelling had went down a couple days later, but now its swollen again from the knee down to his toes, red and heat and tight, I advised pt needs to come in to be seen today if not today then no later than tomorrow, pt states he is at work and can not come, pt had requested to be seen Monday July 23 with Dr. Erlinda Hong. I scheduled him for July 23 but advised him that if symptoms gets worse then needs to go to ED, pt voiced understanding.   Call back # [phone]

## 2017-01-16 ENCOUNTER — Emergency Department (HOSPITAL_COMMUNITY)
Admission: EM | Admit: 2017-01-16 | Discharge: 2017-01-16 | Disposition: A | Discharge status: Home / Self Care | Payer: PPO | Attending: Emergency Medicine | Admitting: Emergency Medicine

## 2017-01-16 ENCOUNTER — Emergency Department (HOSPITAL_BASED_OUTPATIENT_CLINIC_OR_DEPARTMENT_OTHER)
Admit: 2017-01-16 | Discharge: 2017-01-16 | Disposition: A | Discharge status: Home / Self Care | Payer: PPO | Attending: Emergency Medicine | Admitting: Emergency Medicine

## 2017-01-16 ENCOUNTER — Encounter (HOSPITAL_COMMUNITY): Payer: Self-pay

## 2017-01-16 DIAGNOSIS — M79609 Pain in unspecified limb: Secondary | ICD-10-CM

## 2017-01-16 DIAGNOSIS — I110 Hypertensive heart disease with heart failure: Secondary | ICD-10-CM | POA: Diagnosis not present

## 2017-01-16 DIAGNOSIS — Z79899 Other long term (current) drug therapy: Secondary | ICD-10-CM | POA: Insufficient documentation

## 2017-01-16 DIAGNOSIS — I4892 Unspecified atrial flutter: Secondary | ICD-10-CM | POA: Insufficient documentation

## 2017-01-16 DIAGNOSIS — Z8673 Personal history of transient ischemic attack (TIA), and cerebral infarction without residual deficits: Secondary | ICD-10-CM | POA: Diagnosis not present

## 2017-01-16 DIAGNOSIS — M7989 Other specified soft tissue disorders: Secondary | ICD-10-CM

## 2017-01-16 DIAGNOSIS — I5032 Chronic diastolic (congestive) heart failure: Secondary | ICD-10-CM | POA: Diagnosis not present

## 2017-01-16 DIAGNOSIS — R000 Tachycardia, unspecified: Secondary | ICD-10-CM | POA: Diagnosis not present

## 2017-01-16 DIAGNOSIS — F17210 Nicotine dependence, cigarettes, uncomplicated: Secondary | ICD-10-CM | POA: Diagnosis not present

## 2017-01-16 DIAGNOSIS — L03116 Cellulitis of left lower limb: Secondary | ICD-10-CM | POA: Diagnosis not present

## 2017-01-16 DIAGNOSIS — R2242 Localized swelling, mass and lump, left lower limb: Secondary | ICD-10-CM | POA: Diagnosis present

## 2017-01-16 LAB — COMPREHENSIVE METABOLIC PANEL
ALBUMIN: 2.8 g/dL — AB (ref 3.5–5.0)
ALK PHOS: 60 U/L (ref 38–126)
ALT: 18 U/L (ref 17–63)
AST: 19 U/L (ref 15–41)
Anion gap: 11 (ref 5–15)
BUN: 21 mg/dL — ABNORMAL HIGH (ref 6–20)
CALCIUM: 8.5 mg/dL — AB (ref 8.9–10.3)
CO2: 25 mmol/L (ref 22–32)
CREATININE: 0.9 mg/dL (ref 0.61–1.24)
Chloride: 101 mmol/L (ref 101–111)
GFR calc Af Amer: >60 mL/min (ref >60)
GFR calc non Af Amer: >60 mL/min (ref >60)
GLUCOSE: 103 mg/dL — AB (ref 65–99)
Potassium: 4.9 mmol/L (ref 3.5–5.1)
SODIUM: 137 mmol/L (ref 135–145)
Total Bilirubin: 0.4 mg/dL (ref 0.3–1.2)
Total Protein: 7.2 g/dL (ref 6.5–8.1)

## 2017-01-16 LAB — CBC WITH DIFFERENTIAL/PLATELET
BASOS ABS: 0.2 10*3/uL — AB (ref 0.0–0.1)
BASOS PCT: 1 %
EOS ABS: 0.3 10*3/uL (ref 0.0–0.7)
Eosinophils Relative: 2 %
HCT: 30.6 % — ABNORMAL LOW (ref 39.0–52.0)
HEMOGLOBIN: 8.9 g/dL — AB (ref 13.0–17.0)
LYMPHS PCT: 11 %
Lymphs Abs: 1.7 10*3/uL (ref 0.7–4.0)
MCH: 23.9 pg — ABNORMAL LOW (ref 26.0–34.0)
MCHC: 29.1 g/dL — ABNORMAL LOW (ref 30.0–36.0)
MCV: 82.3 fL (ref 78.0–100.0)
Monocytes Absolute: 1.4 10*3/uL — ABNORMAL HIGH (ref 0.1–1.0)
Monocytes Relative: 9 %
NEUTROS PCT: 77 %
Neutro Abs: 11.8 10*3/uL — ABNORMAL HIGH (ref 1.7–7.7)
Platelets: 516 10*3/uL — ABNORMAL HIGH (ref 150–400)
RBC: 3.72 MIL/uL — ABNORMAL LOW (ref 4.22–5.81)
RDW: 16.1 % — ABNORMAL HIGH (ref 11.5–15.5)
WBC: 15.4 10*3/uL — ABNORMAL HIGH (ref 4.0–10.5)

## 2017-01-16 LAB — I-STAT CG4 LACTIC ACID, ED: Lactic Acid, Venous: 1.83 mmol/L (ref 0.5–1.9)

## 2017-01-16 MED ORDER — OXYCODONE-ACETAMINOPHEN 5-325 MG PO TABS
1.0000 | ORAL_TABLET | Freq: Once | ORAL | Status: AC
  Administered 2017-01-16: 1 via ORAL
  Filled 2017-01-16: qty 1

## 2017-01-16 MED ORDER — OXYCODONE-ACETAMINOPHEN 7.5-325 MG PO TABS: 1.0000 | ORAL_TABLET | ORAL | 0 refills | Status: DC | PRN

## 2017-01-16 MED ORDER — DOXYCYCLINE HYCLATE 100 MG PO CAPS: 100.0000 mg | ORAL_CAPSULE | Freq: Two times a day (BID) | ORAL | 0 refills | Status: DC

## 2017-01-16 MED ORDER — DOXYCYCLINE HYCLATE 100 MG IV SOLR
100.0000 mg | Freq: Once | INTRAVENOUS | Status: AC
  Administered 2017-01-16: 100 mg via INTRAVENOUS
  Filled 2017-01-16: qty 100

## 2017-01-16 NOTE — ED Triage Notes (Signed)
Patient complains of left lower pain, swelling and redness for several weeks. Denies drainage from leg. Warmth noted to extremity. Denies trauma

## 2017-01-16 NOTE — ED Notes (Signed)
Patient transported to Ultrasound 

## 2017-01-16 NOTE — ED Provider Notes (Signed)
[facility] DEPT Provider Note   CSN: [id number] Arrival date & time: 01/16/17  1248     History   Chief Complaint Chief Complaint  Patient presents with  . leg redness/swelling    HPI Brian [name] is a 66 y.o. male.  66 yo with pmh of Sepsis 2/2 Cellulitis (admitted here from June 3-December 04, 2016), CHF, HTN, A flutter s/p ablation July 3rd, presents with 2-3 weeks of increased swelling in his anterior left lower leg.  He takes lasix for heart failure and just thought it was swelling from that.  However, 2-3 days ago he noticed a small area of erythema that was painful to the the touch.  He called his PCP who set an appointment for Monday the 23rd of July, but was told if the redness and pain got worse to go to the ED.  Today, he said the pain and redness increased greatly covering the entire anterior lower leg.  Pt denies fevers, chills, nausea, vomiting, diarrhea.        Past Medical History:  Diagnosis Date  . Anxiety   . Arthritis   . Atrial flutter ([place])    a. 08/2012  . CHF (congestive heart failure) ([place])   . Gout   . Hypertension   . Obesity   . Pneumonia   . Shortness of breath   . Stroke ([place])   . Tobacco abuse     Patient Active Problem List   Diagnosis Date Noted  . Polyarthritis 12/01/2016  . Cellulitis of left leg 11/29/2016  . Sepsis ([place]) 11/29/2016  . Effusion, left knee 11/13/2016  . Persistent atrial fibrillation ([place])   . Dyspnea   . Positive urine drug screen   . CAP (community acquired pneumonia) 09/30/2016  . Community acquired pneumonia of left lower lobe of lung ([place])   . Pleural effusion   . Tachypnea   . CVA (cerebral infarction) 09/27/2015  . Right hand pain 09/27/2015  . History of gout 09/27/2015  . Cocaine abuse 09/27/2015  . TIA (transient ischemic attack) 09/27/2015  . Slurred speech   . Primary osteoarthritis of right hand   . Leukocytosis   . Essential hypertension   . Chronic diastolic CHF (congestive heart  failure) ([place])   . HTN (hypertension) 11/22/2013  . Chronic diastolic heart failure ([place]) 11/22/2013  . Noncompliance 11/17/2013  . Atrial flutter with rapid ventricular response ([place]) 11/16/2013  . Acute on chronic diastolic CHF (congestive heart failure) ([place]) 09/05/2012  . Tobacco abuse   . Obesity- BMI 35   . Diastolic CHF ([place])   . Atrial flutter ([place])   . Anxiety     Past Surgical History:  Procedure Laterality Date  . A-FLUTTER ABLATION N/A 12/29/2016   Procedure: A-Flutter Ablation;  Surgeon: [name], MD;  Location: [facility];  Service: Cardiovascular;  Laterality: N/A;  . CARDIOVERSION N/A 11/05/2016   Procedure: CARDIOVERSION;  Surgeon: Sueanne Margarita, MD;  Location: [facility];  Service: Cardiovascular;  Laterality: N/A;  . IR THORACENTESIS ASP PLEURAL SPACE W/IMG GUIDE  10/01/2016  . MICROLARYNGOSCOPY  09/02/2007   with excision of right vocal cord mass, Dr. Constance Holster       Home Medications    Prior to Admission medications   Medication Sig Start Date End Date Taking? Authorizing Provider  acetaminophen (TYLENOL) 500 MG tablet Take 1,000 mg by mouth every 6 (six) hours as needed for moderate pain or headache.    Yes [provider]  [place]  MONTH PAK 0.5 MG X 11 & 1 MG X 42 tablet Take 1 mg by mouth 2 (two) times daily.  12/08/16  Yes [provider]  Docusate Calcium (STOOL SOFTENER PO) Take 3 capsules by mouth every other day.    Yes [provider]  furosemide (LASIX) 40 MG tablet Take 80 mg by mouth 2 (two) times daily.   Yes [provider]  HYDROcodone-acetaminophen (NORCO/VICODIN) 5-325 MG tablet Take 1 tablet by mouth every 4 (four) hours as needed. Patient taking differently: Take 0.5-1 tablets by mouth every 4 (four) hours as needed for moderate pain.  11/23/16  Yes Nona Dell, PA-C  hydrOXYzine (ATARAX/VISTARIL) 25 MG tablet Take 25 mg by mouth daily as needed for anxiety.   Yes [provider]  metoprolol succinate (TOPROL XL) 25 MG 24 hr tablet Take 1 tablet (25 mg total) by mouth daily. 10/23/16  Yes Jerline Pain, MD  potassium chloride SA (K-DUR,KLOR-CON) 20 MEQ tablet Take 1 tablet (20 mEq total) by mouth daily. 10/26/16  Yes Jerline Pain, MD  ranitidine (ZANTAC) 150 MG tablet Take 150 mg by mouth daily.   Yes [provider]  rivaroxaban (XARELTO) 20 MG TABS tablet Take 1 tablet (20 mg total) by mouth daily with supper. Patient taking differently: Take 20 mg by mouth every morning.  01/17/16  Yes Isaiah Serge, NP  famotidine (PEPCID) 40 MG tablet Take 0.5 tablets (20 mg total) by mouth daily. Patient not taking: Reported on 01/16/2017 12/04/16   Lavina Hamman, MD    Family History Family History  Problem Relation Age of Onset  . Cancer Mother   . Heart disease Father   . Heart attack Father   . Cancer Father   . Diabetes Sister     Social History Social History  Substance Use Topics  . Smoking status: Current Every Day Smoker    Packs/day: 1.00    Years: 50.00    Types: Cigarettes  . Smokeless tobacco: Never Used  . Alcohol use No     Comment: socially     Allergies   Tape   Review of Systems Review of Systems  Constitutional: Negative for chills and fever.  HENT: Negative for ear pain and sore throat.   Eyes: Negative for pain and visual disturbance.  Respiratory: Negative for cough and shortness of breath.   Cardiovascular: Positive for leg swelling. Negative for chest pain and palpitations.  Gastrointestinal: Negative for abdominal distention, abdominal pain, nausea and vomiting.  Genitourinary: Negative for dysuria and hematuria.  Musculoskeletal: Negative for arthralgias and back pain.  Skin: Positive for color change. Negative for rash.  Neurological: Negative for seizures and syncope.  Psychiatric/Behavioral: Negative for agitation and behavioral problems.  All other systems reviewed and are negative.    Physical  Exam Updated Vital Signs BP 131/82   Pulse (!) 125   Temp 98.2 F (36.8 C)   Resp (!) 27   SpO2 98%   Physical Exam  Constitutional: He is oriented to person, place, and time. He appears well-developed and well-nourished.  HENT:  Head: Normocephalic and atraumatic.  Eyes: Conjunctivae are normal.  Neck: Neck supple.  Cardiovascular: Normal rate.  An irregularly irregular rhythm present. Exam reveals no gallop and no friction rub.   No murmur heard. Pulmonary/Chest: Effort normal and breath sounds normal. No respiratory distress. He has no wheezes.  Abdominal: Soft. Bowel sounds are normal. He exhibits no mass. There is no tenderness. There is  no rebound.  Musculoskeletal: He exhibits no edema.  Neurological: He is alert and oriented to person, place, and time.  Skin: Skin is warm and dry. There is erythema.     Psychiatric: He has a normal mood and affect.  Nursing note and vitals reviewed.  Media Information     Document Information   Photos    01/16/2017 15:49  Attached To:  Hospital Encounter on 01/16/17  Source Information   Little, Wenda Overland, MD  [facility]     ED Treatments / Results  Labs (all labs ordered are listed, but only abnormal results are displayed) Labs Reviewed  CBC WITH DIFFERENTIAL/PLATELET - Abnormal; Notable for the following:       Result Value   WBC 15.4 (*)    RBC 3.72 (*)    Hemoglobin 8.9 (*)    HCT 30.6 (*)    MCH 23.9 (*)    MCHC 29.1 (*)    RDW 16.1 (*)    Platelets 516 (*)    Neutro Abs 11.8 (*)    Monocytes Absolute 1.4 (*)    Basophils Absolute 0.2 (*)    All other components within normal limits  COMPREHENSIVE METABOLIC PANEL - Abnormal; Notable for the following:    Glucose, Bld 103 (*)    BUN 21 (*)    Calcium 8.5 (*)    Albumin 2.8 (*)    All other components within normal limits  I-STAT CG4 LACTIC ACID, ED    EKG  EKG Interpretation  Date/Time:  Saturday January 16 2017 14:21:49 EDT Ventricular  Rate:  105 PR Interval:    QRS Duration: 93 QT Interval:  411 QTC Calculation: 478 R Axis:   88 Text Interpretation:  Atrial fibrillation Borderline right axis deviation Borderline prolonged QT interval since previous tracing, has converted to a fib from sinus rhythm Confirmed by Theotis Burrow [phone]) on 01/16/2017 3:34:20 PM       Radiology No results found.  Procedures Procedures (including critical care time)  Medications Ordered in ED Medications  doxycycline (VIBRAMYCIN) 100 mg in dextrose 5 % 250 mL IVPB (not administered)  oxyCODONE-acetaminophen (PERCOCET/ROXICET) 5-325 MG per tablet 1 tablet (1 tablet Oral Given 01/16/17 1539)     Initial Impression / Assessment and Plan / ED Course  I have reviewed the triage vital signs and the nursing notes.  Pertinent labs & imaging results that were available during my care of the patient were reviewed by me and considered in my medical decision making (see chart for details).    Cellulitis: recurrent issue, admitted in June for same issue was in sepsis at that time.   -WBC 15k -Lactic Acid 1.8, CMP normal -LE ultrasound negative for DVT -tachycardia but pt in afib -Pt afebrile, normotensive, not immunocompromised, clinically stable -Will dishcarge pt on doxycycline with close follow up with PCP and counseling on when to return to ED  -Pt has appointment with his PCP on Monday    Final Clinical Impressions(s) / ED Diagnoses   Final diagnoses:  Cellulitis of left lower extremity    New Prescriptions New Prescriptions   No medications on file     Katherine Roan, MD 01/16/17 [place], Wenda Overland, MD 01/17/17 [phone]

## 2017-01-16 NOTE — Progress Notes (Signed)
*  PRELIMINARY RESULTS* Vascular Ultrasound Left lower extremity venous duplex has been completed.  Preliminary findings: No evidence of DVT or baker's cyst.   Landry Mellow, RDMS, RVT  01/16/2017, 3:04 PM

## 2017-01-18 ENCOUNTER — Ambulatory Visit (INDEPENDENT_AMBULATORY_CARE_PROVIDER_SITE_OTHER): Payer: PPO | Admitting: Orthopaedic Surgery

## 2017-01-18 ENCOUNTER — Encounter (INDEPENDENT_AMBULATORY_CARE_PROVIDER_SITE_OTHER): Payer: Self-pay | Admitting: Orthopaedic Surgery

## 2017-01-18 DIAGNOSIS — M1712 Unilateral primary osteoarthritis, left knee: Secondary | ICD-10-CM | POA: Diagnosis not present

## 2017-01-18 DIAGNOSIS — M25462 Effusion, left knee: Secondary | ICD-10-CM

## 2017-01-18 NOTE — Progress Notes (Signed)
Office Visit Note   Patient: Brian [name]           Date of Birth: 05/21/1951           MRN: [id number] Visit Date: 01/18/2017              Requested by: Orpah Melter, MD [address] [place], [place] [address] PCP: Orpah Melter, MD   Assessment & Plan: Visit Diagnoses:  1. Effusion, left knee   2. Unilateral primary osteoarthritis, left knee     Plan: I discussed with him that the Baker cyst is benign and usually not symptomatic. I think is more related to his knee effusion and rheumatoid arthritis. We agreed to just treat this symptomatically. He is supposed to see his rheumatologist. Questions encouraged and answered. Follow-up as needed.  Follow-Up Instructions: Return if symptoms worsen or fail to improve.   Orders:  No orders of the defined types were placed in this encounter.  No orders of the defined types were placed in this encounter.     Procedures: No procedures performed   Clinical Data: No additional findings.   Subjective: Chief Complaint  Patient presents with  . Left Knee - Pain  . Left Lower Leg - Cellulitis, Pain    Patient comes in today for left knee Baker cyst. This was found on a vascular study which ruled out for a blood clot. He complains of  left knee pain irradiates the posterior aspect of the knee. He has not seen his rheumatologist yet. He was diagnosed with rheumatoid arthritis during his last hospitalization. He is currently on treatment for cellulitis.     Review of Systems  Constitutional: Negative.   All other systems reviewed and are negative.    Objective: Vital Signs: There were no vitals taken for this visit.  Physical Exam  Constitutional: He is oriented to person, place, and time. He appears well-developed and well-nourished.  Pulmonary/Chest: Effort normal.  Abdominal: Soft.  Neurological: He is alert and oriented to person, place, and time.  Skin: Skin is warm.  Psychiatric: He has a normal mood  and affect. His behavior is normal. Judgment and thought content normal.  Nursing note and vitals reviewed.   Ortho Exam Left knee exam shows a small effusion. I'm not really able to palpate the Baker cyst. Specialty Comments:  No specialty comments available.  Imaging: No results found.   PMFS History: Patient Active Problem List   Diagnosis Date Noted  . Polyarthritis 12/01/2016  . Cellulitis of left leg 11/29/2016  . Sepsis ([place]) 11/29/2016  . Effusion, left knee 11/13/2016  . Persistent atrial fibrillation ([place])   . Dyspnea   . Positive urine drug screen   . CAP (community acquired pneumonia) 09/30/2016  . Community acquired pneumonia of left lower lobe of lung ([place])   . Pleural effusion   . Tachypnea   . CVA (cerebral infarction) 09/27/2015  . Right hand pain 09/27/2015  . History of gout 09/27/2015  . Cocaine abuse 09/27/2015  . TIA (transient ischemic attack) 09/27/2015  . Slurred speech   . Primary osteoarthritis of right hand   . Leukocytosis   . Essential hypertension   . Chronic diastolic CHF (congestive heart failure) ([place])   . HTN (hypertension) 11/22/2013  . Chronic diastolic heart failure ([place]) 11/22/2013  . Noncompliance 11/17/2013  . Atrial flutter with rapid ventricular response ([place]) 11/16/2013  . Acute on chronic diastolic CHF (congestive heart failure) ([place]) 09/05/2012  . Tobacco  abuse   . Obesity- BMI 35   . Diastolic CHF ([place])   . Atrial flutter ([place])   . Anxiety    Past Medical History:  Diagnosis Date  . Anxiety   . Arthritis   . Atrial flutter ([place])    a. 08/2012  . CHF (congestive heart failure) ([place])   . Gout   . Hypertension   . Obesity   . Pneumonia   . Shortness of breath   . Stroke ([place])   . Tobacco abuse     Family History  Problem Relation Age of Onset  . Cancer Mother   . Heart disease Father   . Heart attack Father   . Cancer Father   . Diabetes Sister     Past Surgical History:  Procedure Laterality Date  .  A-FLUTTER ABLATION N/A 12/29/2016   Procedure: A-Flutter Ablation;  Surgeon: [name], MD;  Location: [facility];  Service: Cardiovascular;  Laterality: N/A;  . CARDIOVERSION N/A 11/05/2016   Procedure: CARDIOVERSION;  Surgeon: Sueanne Margarita, MD;  Location: [facility];  Service: Cardiovascular;  Laterality: N/A;  . IR THORACENTESIS ASP PLEURAL SPACE W/IMG GUIDE  10/01/2016  . MICROLARYNGOSCOPY  09/02/2007   with excision of right vocal cord mass, Dr. Constance Holster   Social History   Occupational History  . Not on file.   Social History Main Topics  . Smoking status: Current Every Day Smoker    Packs/day: 1.00    Years: 50.00    Types: Cigarettes  . Smokeless tobacco: Never Used  . Alcohol use No     Comment: socially  . Drug use: No  . Sexual activity: Not Currently

## 2017-01-22 ENCOUNTER — Encounter (HOSPITAL_COMMUNITY): Payer: Self-pay | Admitting: Emergency Medicine

## 2017-01-22 ENCOUNTER — Inpatient Hospital Stay (HOSPITAL_COMMUNITY)
Admission: EM | Admit: 2017-01-22 | Discharge: 2017-01-24 | DRG: 872 | Disposition: A | Discharge status: Home / Self Care | Payer: PPO | Attending: Family Medicine | Admitting: Family Medicine

## 2017-01-22 DIAGNOSIS — Z6835 Body mass index (BMI) 35.0-35.9, adult: Secondary | ICD-10-CM | POA: Diagnosis not present

## 2017-01-22 DIAGNOSIS — M109 Gout, unspecified: Secondary | ICD-10-CM | POA: Diagnosis not present

## 2017-01-22 DIAGNOSIS — I4891 Unspecified atrial fibrillation: Secondary | ICD-10-CM | POA: Diagnosis present

## 2017-01-22 DIAGNOSIS — Z79899 Other long term (current) drug therapy: Secondary | ICD-10-CM

## 2017-01-22 DIAGNOSIS — D649 Anemia, unspecified: Secondary | ICD-10-CM | POA: Diagnosis present

## 2017-01-22 DIAGNOSIS — I110 Hypertensive heart disease with heart failure: Secondary | ICD-10-CM | POA: Diagnosis not present

## 2017-01-22 DIAGNOSIS — F419 Anxiety disorder, unspecified: Secondary | ICD-10-CM | POA: Diagnosis present

## 2017-01-22 DIAGNOSIS — A419 Sepsis, unspecified organism: Secondary | ICD-10-CM | POA: Diagnosis not present

## 2017-01-22 DIAGNOSIS — I4892 Unspecified atrial flutter: Secondary | ICD-10-CM | POA: Diagnosis not present

## 2017-01-22 DIAGNOSIS — L03116 Cellulitis of left lower limb: Secondary | ICD-10-CM | POA: Diagnosis not present

## 2017-01-22 DIAGNOSIS — M1990 Unspecified osteoarthritis, unspecified site: Secondary | ICD-10-CM | POA: Diagnosis not present

## 2017-01-22 DIAGNOSIS — F17210 Nicotine dependence, cigarettes, uncomplicated: Secondary | ICD-10-CM | POA: Diagnosis present

## 2017-01-22 DIAGNOSIS — Z7901 Long term (current) use of anticoagulants: Secondary | ICD-10-CM

## 2017-01-22 DIAGNOSIS — L0390 Cellulitis, unspecified: Secondary | ICD-10-CM | POA: Diagnosis present

## 2017-01-22 DIAGNOSIS — Z8673 Personal history of transient ischemic attack (TIA), and cerebral infarction without residual deficits: Secondary | ICD-10-CM | POA: Diagnosis not present

## 2017-01-22 DIAGNOSIS — I10 Essential (primary) hypertension: Secondary | ICD-10-CM | POA: Diagnosis present

## 2017-01-22 DIAGNOSIS — E669 Obesity, unspecified: Secondary | ICD-10-CM | POA: Diagnosis present

## 2017-01-22 DIAGNOSIS — I5033 Acute on chronic diastolic (congestive) heart failure: Secondary | ICD-10-CM | POA: Diagnosis present

## 2017-01-22 DIAGNOSIS — M069 Rheumatoid arthritis, unspecified: Secondary | ICD-10-CM | POA: Diagnosis present

## 2017-01-22 DIAGNOSIS — I5032 Chronic diastolic (congestive) heart failure: Secondary | ICD-10-CM | POA: Diagnosis not present

## 2017-01-22 DIAGNOSIS — I5030 Unspecified diastolic (congestive) heart failure: Secondary | ICD-10-CM | POA: Diagnosis present

## 2017-01-22 LAB — LACTIC ACID, PLASMA
LACTIC ACID, VENOUS: 0.8 mmol/L (ref 0.5–1.9)
Lactic Acid, Venous: 0.9 mmol/L (ref 0.5–1.9)

## 2017-01-22 LAB — CBC WITH DIFFERENTIAL/PLATELET
BASOS ABS: 0.1 10*3/uL (ref 0.0–0.1)
BASOS ABS: 0.1 10*3/uL (ref 0.0–0.1)
BASOS PCT: 0 %
Basophils Relative: 0 %
EOS ABS: 0.2 10*3/uL (ref 0.0–0.7)
EOS PCT: 1 %
EOS PCT: 1 %
Eosinophils Absolute: 0.2 10*3/uL (ref 0.0–0.7)
HCT: 26.4 % — ABNORMAL LOW (ref 39.0–52.0)
HCT: 28.3 % — ABNORMAL LOW (ref 39.0–52.0)
Hemoglobin: 7.7 g/dL — ABNORMAL LOW (ref 13.0–17.0)
Hemoglobin: 8.1 g/dL — ABNORMAL LOW (ref 13.0–17.0)
LYMPHS PCT: 8 %
Lymphocytes Relative: 9 %
Lymphs Abs: 1.5 10*3/uL (ref 0.7–4.0)
Lymphs Abs: 1.6 10*3/uL (ref 0.7–4.0)
MCH: 22.6 pg — ABNORMAL LOW (ref 26.0–34.0)
MCH: 22.8 pg — ABNORMAL LOW (ref 26.0–34.0)
MCHC: 28.6 g/dL — ABNORMAL LOW (ref 30.0–36.0)
MCHC: 29.2 g/dL — ABNORMAL LOW (ref 30.0–36.0)
MCV: 78.1 fL (ref 78.0–100.0)
MCV: 79.1 fL (ref 78.0–100.0)
MONO ABS: 1.4 10*3/uL — AB (ref 0.1–1.0)
Monocytes Absolute: 1.1 10*3/uL — ABNORMAL HIGH (ref 0.1–1.0)
Monocytes Relative: 6 %
Monocytes Relative: 8 %
NEUTROS PCT: 85 %
Neutro Abs: 14.4 10*3/uL — ABNORMAL HIGH (ref 1.7–7.7)
Neutro Abs: 16.2 10*3/uL — ABNORMAL HIGH (ref 1.7–7.7)
Neutrophils Relative %: 82 %
PLATELETS: 463 10*3/uL — AB (ref 150–400)
PLATELETS: 612 10*3/uL — AB (ref 150–400)
RBC: 3.38 MIL/uL — ABNORMAL LOW (ref 4.22–5.81)
RBC: 3.58 MIL/uL — AB (ref 4.22–5.81)
RDW: 16 % — AB (ref 11.5–15.5)
RDW: 16 % — ABNORMAL HIGH (ref 11.5–15.5)
WBC: 17.6 10*3/uL — ABNORMAL HIGH (ref 4.0–10.5)
WBC: 19.1 10*3/uL — AB (ref 4.0–10.5)

## 2017-01-22 LAB — COMPREHENSIVE METABOLIC PANEL
ALBUMIN: 2.5 g/dL — AB (ref 3.5–5.0)
ALT: 17 U/L (ref 17–63)
ALT: 15 U/L — ABNORMAL LOW (ref 17–63)
AST: 16 U/L (ref 15–41)
AST: 21 U/L (ref 15–41)
Albumin: 2.8 g/dL — ABNORMAL LOW (ref 3.5–5.0)
Alkaline Phosphatase: 57 U/L (ref 38–126)
Alkaline Phosphatase: 47 U/L (ref 38–126)
Anion gap: 10 (ref 5–15)
Anion gap: 8 (ref 5–15)
BILIRUBIN TOTAL: 0.4 mg/dL (ref 0.3–1.2)
BUN: 22 mg/dL — AB (ref 6–20)
BUN: 31 mg/dL — AB (ref 6–20)
CHLORIDE: 99 mmol/L — AB (ref 101–111)
CO2: 27 mmol/L (ref 22–32)
CO2: 29 mmol/L (ref 22–32)
CREATININE: 0.99 mg/dL (ref 0.61–1.24)
Calcium: 8.9 mg/dL (ref 8.9–10.3)
Calcium: 8.3 mg/dL — ABNORMAL LOW (ref 8.9–10.3)
Chloride: 99 mmol/L — ABNORMAL LOW (ref 101–111)
Creatinine, Ser: 0.8 mg/dL (ref 0.61–1.24)
GFR calc Af Amer: >60 mL/min (ref >60)
GFR calc non Af Amer: >60 mL/min (ref >60)
GLUCOSE: 118 mg/dL — AB (ref 65–99)
Glucose, Bld: 136 mg/dL — ABNORMAL HIGH (ref 65–99)
POTASSIUM: 4.2 mmol/L (ref 3.5–5.1)
POTASSIUM: 4.6 mmol/L (ref 3.5–5.1)
Sodium: 136 mmol/L (ref 135–145)
Sodium: 136 mmol/L (ref 135–145)
TOTAL PROTEIN: 7 g/dL (ref 6.5–8.1)
Total Bilirubin: 0.7 mg/dL (ref 0.3–1.2)
Total Protein: 6.4 g/dL — ABNORMAL LOW (ref 6.5–8.1)

## 2017-01-22 LAB — APTT: aPTT: 54 seconds — ABNORMAL HIGH (ref 24–36)

## 2017-01-22 LAB — PROTIME-INR
INR: 1.98
PROTHROMBIN TIME: 22.8 s — AB (ref 11.4–15.2)

## 2017-01-22 LAB — I-STAT CG4 LACTIC ACID, ED: Lactic Acid, Venous: 2.66 mmol/L (ref 0.5–1.9)

## 2017-01-22 LAB — HIV ANTIBODY (ROUTINE TESTING W REFLEX): HIV Screen 4th Generation wRfx: NONREACTIVE

## 2017-01-22 LAB — PROCALCITONIN: PROCALCITONIN: 0.17 ng/mL

## 2017-01-22 MED ORDER — VANCOMYCIN HCL IN DEXTROSE 1-5 GM/200ML-% IV SOLN
1000.0000 mg | Freq: Two times a day (BID) | INTRAVENOUS | Status: DC
  Administered 2017-01-22 – 2017-01-23 (×4): 1000 mg via INTRAVENOUS
  Filled 2017-01-22 (×5): qty 200

## 2017-01-22 MED ORDER — ONDANSETRON HCL 4 MG PO TABS: 4.0000 mg | ORAL_TABLET | Freq: Four times a day (QID) | ORAL | Status: DC | PRN

## 2017-01-22 MED ORDER — HYDROMORPHONE HCL 1 MG/ML IJ SOLN
1.0000 mg | Freq: Once | INTRAMUSCULAR | Status: AC
Start: 2017-01-22 — End: 2017-01-22
  Administered 2017-01-22: 1 mg via INTRAVENOUS
  Filled 2017-01-22: qty 1

## 2017-01-22 MED ORDER — METOPROLOL SUCCINATE ER 25 MG PO TB24
25.0000 mg | ORAL_TABLET | Freq: Every day | ORAL | Status: DC
  Administered 2017-01-22 – 2017-01-24 (×3): 25 mg via ORAL
  Filled 2017-01-22 (×3): qty 1

## 2017-01-22 MED ORDER — ACETAMINOPHEN 650 MG RE SUPP: 650.0000 mg | Freq: Four times a day (QID) | RECTAL | Status: DC | PRN

## 2017-01-22 MED ORDER — OXYCODONE-ACETAMINOPHEN 7.5-325 MG PO TABS
1.0000 | ORAL_TABLET | ORAL | Status: DC | PRN
  Administered 2017-01-22 – 2017-01-24 (×10): 1 via ORAL
  Filled 2017-01-22 (×10): qty 1

## 2017-01-22 MED ORDER — FAMOTIDINE 20 MG PO TABS
20.0000 mg | ORAL_TABLET | Freq: Two times a day (BID) | ORAL | Status: DC
  Administered 2017-01-22 – 2017-01-24 (×5): 20 mg via ORAL
  Filled 2017-01-22 (×5): qty 1

## 2017-01-22 MED ORDER — SODIUM CHLORIDE 0.9 % IV SOLN
1250.0000 mg | Freq: Once | INTRAVENOUS | Status: AC
  Administered 2017-01-22: 1250 mg via INTRAVENOUS
  Filled 2017-01-22: qty 1250

## 2017-01-22 MED ORDER — SODIUM CHLORIDE 0.9 % IV BOLUS (SEPSIS)
Freq: Once | INTRAVENOUS | Status: AC
  Administered 2017-01-22: 1000 mL via INTRAVENOUS

## 2017-01-22 MED ORDER — ONDANSETRON HCL 4 MG/2ML IJ SOLN: 4.0000 mg | Freq: Four times a day (QID) | INTRAMUSCULAR | Status: DC | PRN

## 2017-01-22 MED ORDER — VARENICLINE TARTRATE 1 MG PO TABS
1.0000 mg | ORAL_TABLET | Freq: Two times a day (BID) | ORAL | Status: DC
Start: 2017-01-22 — End: 2017-01-24
  Administered 2017-01-22 – 2017-01-24 (×5): 1 mg via ORAL
  Filled 2017-01-22 (×6): qty 1

## 2017-01-22 MED ORDER — PIPERACILLIN-TAZOBACTAM 3.375 G IVPB 30 MIN
3.3750 g | Freq: Once | INTRAVENOUS | Status: AC
  Administered 2017-01-22: 3.375 g via INTRAVENOUS
  Filled 2017-01-22: qty 50

## 2017-01-22 MED ORDER — RIVAROXABAN 20 MG PO TABS
20.0000 mg | ORAL_TABLET | Freq: Every day | ORAL | Status: DC
Start: 2017-01-22 — End: 2017-01-24
  Administered 2017-01-22 – 2017-01-24 (×3): 20 mg via ORAL
  Filled 2017-01-22 (×3): qty 1

## 2017-01-22 MED ORDER — PIPERACILLIN-TAZOBACTAM 3.375 G IVPB
3.3750 g | Freq: Three times a day (TID) | INTRAVENOUS | Status: DC
  Administered 2017-01-22 – 2017-01-24 (×6): 3.375 g via INTRAVENOUS
  Filled 2017-01-22 (×9): qty 50

## 2017-01-22 MED ORDER — DOCUSATE SODIUM 100 MG PO CAPS
300.0000 mg | ORAL_CAPSULE | ORAL | Status: DC
  Administered 2017-01-23: 300 mg via ORAL
  Filled 2017-01-22 (×2): qty 3

## 2017-01-22 MED ORDER — ACETAMINOPHEN 325 MG PO TABS
650.0000 mg | ORAL_TABLET | Freq: Four times a day (QID) | ORAL | Status: DC | PRN
  Administered 2017-01-23 (×2): 650 mg via ORAL
  Filled 2017-01-22 (×2): qty 2

## 2017-01-22 MED ORDER — HYDROXYZINE HCL 25 MG PO TABS: 25.0000 mg | ORAL_TABLET | Freq: Every day | ORAL | Status: DC | PRN

## 2017-01-22 NOTE — ED Notes (Signed)
Unable to get an iv started  The pt yells out in pain  Even with movement  Iv team consulted

## 2017-01-22 NOTE — ED Notes (Signed)
Date and time results received: 01/22/17 1:08 AM  (use smartphrase ".now" to insert current time)  Test: istat lac acid Critical Value: 2.66  Name of Provider Notified: Venora Maples  Orders Received? Or Actions Taken?:

## 2017-01-22 NOTE — ED Provider Notes (Signed)
[facility] DEPT Provider Note   CSN: [id number] Arrival date & time: 01/22/17  0016  By signing my name below, I, Ny'Kea Lewis, attest that this documentation has been prepared under the direction and in the presence of Jola Schmidt, MD. Electronically Signed: Lise Auer, ED Scribe. 01/22/17. 2:31 AM.  History   Chief Complaint Chief Complaint  Patient presents with  . Leg Pain  . Leg Swelling   The history is provided by the patient. No language interpreter was used.   HPI HPI Comments Brian [name] is a 66 y.o. male with a PMHx of arthritis, CHF, HTN, CVA, and gout, who presents to the Emergency Department complaining of gradually worsening, intermittent, left lower leg extremity swelling that worsened today. Pt notes intermittent mild redness to the the left lower extremity. Pt reports he was seen in the ED on 7/21, and diagnosed with cellulitis of the left lower extremity and he was discharged with Doxycycline, which he reports he has been compliant. After discharge he reports the redness and swelling improved tremendously, but today they returned. He is currently on Xarelto. No associated symptoms noted at this time.   Past Medical History:  Diagnosis Date  . Anxiety   . Arthritis   . Atrial flutter ([place])    a. 08/2012  . CHF (congestive heart failure) ([place])   . Gout   . Hypertension   . Obesity   . Pneumonia   . Shortness of breath   . Stroke ([place])   . Tobacco abuse     Patient Active Problem List   Diagnosis Date Noted  . Polyarthritis 12/01/2016  . Cellulitis of left leg 11/29/2016  . Sepsis ([place]) 11/29/2016  . Effusion, left knee 11/13/2016  . Persistent atrial fibrillation ([place])   . Dyspnea   . Positive urine drug screen   . CAP (community acquired pneumonia) 09/30/2016  . Community acquired pneumonia of left lower lobe of lung ([place])   . Pleural effusion   . Tachypnea   . CVA (cerebral infarction) 09/27/2015  . Right hand pain 09/27/2015  .  History of gout 09/27/2015  . Cocaine abuse 09/27/2015  . TIA (transient ischemic attack) 09/27/2015  . Slurred speech   . Primary osteoarthritis of right hand   . Leukocytosis   . Essential hypertension   . Chronic diastolic CHF (congestive heart failure) ([place])   . HTN (hypertension) 11/22/2013  . Chronic diastolic heart failure ([place]) 11/22/2013  . Noncompliance 11/17/2013  . Atrial flutter with rapid ventricular response ([place]) 11/16/2013  . Acute on chronic diastolic CHF (congestive heart failure) ([place]) 09/05/2012  . Tobacco abuse   . Obesity- BMI 35   . Diastolic CHF ([place])   . Atrial flutter ([place])   . Anxiety    Past Surgical History:  Procedure Laterality Date  . A-FLUTTER ABLATION N/A 12/29/2016   Procedure: A-Flutter Ablation;  Surgeon: [name], MD;  Location: [facility];  Service: Cardiovascular;  Laterality: N/A;  . CARDIOVERSION N/A 11/05/2016   Procedure: CARDIOVERSION;  Surgeon: Sueanne Margarita, MD;  Location: [facility];  Service: Cardiovascular;  Laterality: N/A;  . IR THORACENTESIS ASP PLEURAL SPACE W/IMG GUIDE  10/01/2016  . MICROLARYNGOSCOPY  09/02/2007   with excision of right vocal cord mass, Dr. Constance Holster    Home Medications    Prior to Admission medications   Medication Sig Start Date End Date Taking? Authorizing Provider  acetaminophen (TYLENOL) 500 MG tablet Take 1,000 mg by mouth every 6 (six) hours as  needed for moderate pain or headache.    Yes [provider]  CHANTIX STARTING MONTH PAK 0.5 MG X 11 & 1 MG X 42 tablet Take 1 mg by mouth 2 (two) times daily.  12/08/16  Yes [provider]  docusate sodium (COLACE) 100 MG capsule Take 300 mg by mouth every other day.   Yes [provider]  doxycycline (VIBRAMYCIN) 100 MG capsule Take 1 capsule (100 mg total) by mouth 2 (two) times daily. 01/16/17 01/23/17 Yes Katherine Roan, MD  furosemide (LASIX) 40 MG tablet Take 80 mg by mouth 2 (two) times daily.   Yes [provider]  hydrOXYzine (ATARAX/VISTARIL) 25 MG tablet Take 25 mg by mouth daily as needed for anxiety.   Yes [provider]  metoprolol succinate (TOPROL XL) 25 MG 24 hr tablet Take 1 tablet (25 mg total) by mouth daily. 10/23/16  Yes Jerline Pain, MD  oxyCODONE-acetaminophen (PERCOCET) 7.5-325 MG tablet Take 1 tablet by mouth every 4 (four) hours as needed for severe pain. 01/16/17 01/23/17 Yes WinfreyJenne Pane, MD  potassium chloride SA (K-DUR,KLOR-CON) 20 MEQ tablet Take 1 tablet (20 mEq total) by mouth daily. 10/26/16  Yes Jerline Pain, MD  ranitidine (ZANTAC) 150 MG tablet Take 150 mg by mouth daily.   Yes [provider]  rivaroxaban (XARELTO) 20 MG TABS tablet Take 1 tablet (20 mg total) by mouth daily with supper. Patient taking differently: Take 20 mg by mouth daily.  01/17/16  Yes Isaiah Serge, NP  famotidine (PEPCID) 40 MG tablet Take 0.5 tablets (20 mg total) by mouth daily. Patient not taking: Reported on 01/22/2017 12/04/16   Lavina Hamman, MD  HYDROcodone-acetaminophen (NORCO/VICODIN) 5-325 MG tablet Take 1 tablet by mouth every 4 (four) hours as needed. Patient not taking: Reported on 01/22/2017 11/23/16   Nona Dell, PA-C   Family History Family History  Problem Relation Age of Onset  . Cancer Mother   . Heart disease Father   . Heart attack Father   . Cancer Father   . Diabetes Sister    Social History Social History  Substance Use Topics  . Smoking status: Current Every Day Smoker    Packs/day: 1.00    Years: 50.00    Types: Cigarettes  . Smokeless tobacco: Never Used  . Alcohol use No     Comment: socially   Allergies   Tape  Review of Systems Review of Systems A complete 10 system review of systems was obtained and all systems are negative except as noted in the HPI and PMH.   Physical Exam Updated Vital Signs BP (!) 158/68 (BP Location: Right Arm)   Pulse (!) 110   Temp 98.5 F (36.9 C) (Oral)   Resp 20   SpO2  98%   Physical Exam  Constitutional: He is oriented to person, place, and time. He appears well-developed and well-nourished.  HENT:  Head: Normocephalic and atraumatic.  Eyes: EOM are normal.  Neck: Normal range of motion.  Cardiovascular: Regular rhythm, normal heart sounds and intact distal pulses.  Tachycardia present.   Pulmonary/Chest: Effort normal and breath sounds normal. No respiratory distress.  Abdominal: Soft. He exhibits no distension. There is no tenderness.  Musculoskeletal: Normal range of motion.  Erythema and swelling of the left lower extremity with tenderness and warmth.  No obvious abscess.  No drainage.  Normal dopplerable PT pulse in the left foot but no dopplerable DP pulse.  Neurological: He is  alert and oriented to person, place, and time.  Skin: Skin is warm and dry.  Psychiatric: He has a normal mood and affect. Judgment normal.  Nursing note and vitals reviewed.  ED Treatments / Results  DIAGNOSTIC STUDIES: Oxygen Saturation is 98% on RA, normal by my interpretation.   COORDINATION OF CARE: 2:29 AM-Discussed next steps with pt. Pt verbalized understanding and is agreeable with the plan.   Labs (all labs ordered are listed, but only abnormal results are displayed) Labs Reviewed  COMPREHENSIVE METABOLIC PANEL - Abnormal; Notable for the following:       Result Value   Chloride 99 (*)    Glucose, Bld 136 (*)    BUN 31 (*)    Albumin 2.8 (*)    All other components within normal limits  CBC WITH DIFFERENTIAL/PLATELET - Abnormal; Notable for the following:    WBC 19.1 (*)    RBC 3.58 (*)    Hemoglobin 8.1 (*)    HCT 28.3 (*)    MCH 22.6 (*)    MCHC 28.6 (*)    RDW 16.0 (*)    Platelets 612 (*)    Neutro Abs 16.2 (*)    Monocytes Absolute 1.1 (*)    All other components within normal limits  I-STAT CG4 LACTIC ACID, ED - Abnormal; Notable for the following:    Lactic Acid, Venous 2.66 (*)    All other components within normal limits  CULTURE,  BLOOD (ROUTINE X 2)  CULTURE, BLOOD (ROUTINE X 2)    EKG  EKG Interpretation None      Radiology No results found.  Procedures Procedures (including critical care time)  Medications Ordered in ED Medications  vancomycin (VANCOCIN) 1,250 mg in sodium chloride 0.9 % 250 mL IVPB (not administered)  piperacillin-tazobactam (ZOSYN) IVPB 3.375 g (not administered)  sodium chloride 0.9 % bolus 30 mL/kg (not administered)  HYDROmorphone (DILAUDID) injection 1 mg (not administered)    Initial Impression / Assessment and Plan / ED Course  I have reviewed the triage vital signs and the nursing notes.  Pertinent labs & imaging results that were available during my care of the patient were reviewed by me and considered in my medical decision making (see chart for details).     Patient with failed outpatient treatment of cellulitis.  Patient with worsening tenderness and erythema of the left lower extremity despite outpatient antibiotics.  Patient be placed on broad-spectrum IV antibiotics.  Blood cultures obtained.  Lactate elevated.  Heart rate elevated to 120.  30 cc/kg bolus given.  Sepsis criteria met.  Final Clinical Impressions(s) / ED Diagnoses   Final diagnoses:  Cellulitis of left lower extremity   New Prescriptions New Prescriptions   No medications on file   I personally performed the services described in this documentation, which was scribed in my presence. The recorded information has been reviewed and is accurate.        Jola Schmidt, MD 01/22/17 [phone]

## 2017-01-22 NOTE — ED Notes (Signed)
The pt has had cellulitis of the lt lower extremity si nce Sunday he was here and had an iv antibiotic.  Swelling in both his lower extremities  Good pedal pulses  Redness from the lt knee down  He has been working for the past 2 days and his pain has become worse and his hands and fingers are swollen.  C/o pain  Not a diabetic

## 2017-01-22 NOTE — ED Notes (Signed)
Lactic will need drawn after fluids.  Previous lactic was done prior to fluid administration.

## 2017-01-22 NOTE — H&P (Signed)
History and Physical    Brian [name] DOB: 07-29-1950 DOA: 01/22/2017  PCP: Orpah Melter, MD  Patient coming from: Home.  Chief Complaint: Left leg erythema and pain.  HPI: Brian [name] is a 66 y.o. male with history of atrial flutter status post ablation on first week of July 3 weeks ago and Xarelto who was admitted in June 1 week of this year for sepsis secondary to cellulitis of the left lower extremity presents with worsening pain and swelling of the left lower extremity over the last 5 days. Patient had come to the ER 5 days ago and was given IV antibiotics and Dopplers were negative for DVT and was discharged home on doxycycline. Patient states symptoms improved for 2-3 days but again started worsening. Patient presented back to the ER.   ED Course: In the ER patient is found to be tachycardic with leukocytosis and on exam patient has erythema and swelling of the left lower extremity and tenderness. Consistent with cellulitis but no definite signs of any compartment syndrome. Blood cultures (patient was given fluid bolus since lactate was elevated and admitted for further management of sepsis secondary to cellulitis of the left lower extremity.  Review of Systems: As per HPI, rest all negative.   Past Medical History:  Diagnosis Date  . Anxiety   . Arthritis   . Atrial flutter ([place])    a. 08/2012  . CHF (congestive heart failure) ([place])   . Gout   . Hypertension   . Obesity   . Pneumonia   . Shortness of breath   . Stroke ([place])   . Tobacco abuse     Past Surgical History:  Procedure Laterality Date  . A-FLUTTER ABLATION N/A 12/29/2016   Procedure: A-Flutter Ablation;  Surgeon: [name], MD;  Location: [facility];  Service: Cardiovascular;  Laterality: N/A;  . CARDIOVERSION N/A 11/05/2016   Procedure: CARDIOVERSION;  Surgeon: Sueanne Margarita, MD;  Location: [facility];  Service: Cardiovascular;  Laterality: N/A;  . IR THORACENTESIS ASP  PLEURAL SPACE W/IMG GUIDE  10/01/2016  . MICROLARYNGOSCOPY  09/02/2007   with excision of right vocal cord mass, Dr. Constance Holster     reports that he has been smoking Cigarettes.  He has a 50.00 pack-year smoking history. He has never used smokeless tobacco. He reports that he does not drink alcohol or use drugs.  Allergies  Allergen Reactions  . Tape Rash and Other (See Comments)    Paper tape is preferred, please!!    Family History  Problem Relation Age of Onset  . Cancer Mother   . Heart disease Father   . Heart attack Father   . Cancer Father   . Diabetes Sister     Prior to Admission medications   Medication Sig Start Date End Date Taking? Authorizing Provider  acetaminophen (TYLENOL) 500 MG tablet Take 1,000 mg by mouth every 6 (six) hours as needed for moderate pain or headache.    Yes [provider]  CHANTIX STARTING MONTH PAK 0.5 MG X 11 & 1 MG X 42 tablet Take 1 mg by mouth 2 (two) times daily.  12/08/16  Yes [provider]  docusate sodium (COLACE) 100 MG capsule Take 300 mg by mouth every other day.   Yes [provider]  doxycycline (VIBRAMYCIN) 100 MG capsule Take 1 capsule (100 mg total) by mouth 2 (two) times daily. 01/16/17 01/23/17 Yes Katherine Roan, MD  furosemide (LASIX) 40 MG tablet Take  80 mg by mouth 2 (two) times daily.   Yes [provider]  hydrOXYzine (ATARAX/VISTARIL) 25 MG tablet Take 25 mg by mouth daily as needed for anxiety.   Yes [provider]  metoprolol succinate (TOPROL XL) 25 MG 24 hr tablet Take 1 tablet (25 mg total) by mouth daily. 10/23/16  Yes Jerline Pain, MD  oxyCODONE-acetaminophen (PERCOCET) 7.5-325 MG tablet Take 1 tablet by mouth every 4 (four) hours as needed for severe pain. 01/16/17 01/23/17 Yes WinfreyJenne Pane, MD  potassium chloride SA (K-DUR,KLOR-CON) 20 MEQ tablet Take 1 tablet (20 mEq total) by mouth daily. 10/26/16  Yes Jerline Pain, MD  ranitidine (ZANTAC) 150 MG tablet Take 150 mg  by mouth daily.   Yes [provider]  rivaroxaban (XARELTO) 20 MG TABS tablet Take 1 tablet (20 mg total) by mouth daily with supper. Patient taking differently: Take 20 mg by mouth daily.  01/17/16  Yes Isaiah Serge, NP  famotidine (PEPCID) 40 MG tablet Take 0.5 tablets (20 mg total) by mouth daily. Patient not taking: Reported on 01/22/2017 12/04/16   Lavina Hamman, MD  HYDROcodone-acetaminophen (NORCO/VICODIN) 5-325 MG tablet Take 1 tablet by mouth every 4 (four) hours as needed. Patient not taking: Reported on 01/22/2017 11/23/16   Nona Dell, PA-C    Physical Exam: Vitals:   01/22/17 0200 01/22/17 0215 01/22/17 0230 01/22/17 0445  BP: 109/74 128/87 126/76 138/74  Pulse: 89 (!) 111 (!) 114 (!) 103  Resp:    18  Temp:    98.4 F (36.9 C)  TempSrc:    Oral  SpO2: 98% 95% 96% 96%      Constitutional: Moderately built and nourished. Vitals:   01/22/17 0200 01/22/17 0215 01/22/17 0230 01/22/17 0445  BP: 109/74 128/87 126/76 138/74  Pulse: 89 (!) 111 (!) 114 (!) 103  Resp:    18  Temp:    98.4 F (36.9 C)  TempSrc:    Oral  SpO2: 98% 95% 96% 96%   Eyes: Anicteric mild pallor. ENMT: No discharge from the ears eyes nose and mouth. Neck: No mass felt. No JVD appreciated. Respiratory: No rhonchi or crepitations. Cardiovascular: S1 and S2 heard mildly tachycardic. Abdomen: Soft nontender bowel sounds present. Musculoskeletal: Bilateral lower extremity edema. Skin: Erythema of the skin below her left knee standing up to the ankle. Tenderness present. Neurologic: Alert awake oriented to time place and person. Moves all extremities. Psychiatric: Appears normal. Normal affect.   Labs on Admission: I have personally reviewed following labs and imaging studies  CBC:  Recent Labs Lab 01/16/17 1410 01/22/17 0031  WBC 15.4* 19.1*  NEUTROABS 11.8* 16.2*  HGB 8.9* 8.1*  HCT 30.6* 28.3*  MCV 82.3 79.1  PLT 516* 433*   Basic Metabolic Panel:  Recent  Labs Lab 01/16/17 1410 01/22/17 0031  NA 137 136  K 4.9 4.6  CL 101 99*  CO2 25 27  GLUCOSE 103* 136*  BUN 21* 31*  CREATININE 0.90 0.99  CALCIUM 8.5* 8.9   GFR: CrCl cannot be calculated (Unknown ideal weight.). Liver Function Tests:  Recent Labs Lab 01/16/17 1410 01/22/17 0031  AST 19 21  ALT 18 17  ALKPHOS 60 57  BILITOT 0.4 0.4  PROT 7.2 7.0  ALBUMIN 2.8* 2.8*   No results for input(s): LIPASE, AMYLASE in the last 168 hours. No results for input(s): AMMONIA in the last 168 hours. Coagulation Profile: No results for input(s): INR, PROTIME in the last 168 hours.  Cardiac Enzymes: No results for input(s): CKTOTAL, CKMB, CKMBINDEX, TROPONINI in the last 168 hours. BNP (last 3 results) No results for input(s): PROBNP in the last 8760 hours. HbA1C: No results for input(s): HGBA1C in the last 72 hours. CBG: No results for input(s): GLUCAP in the last 168 hours. Lipid Profile: No results for input(s): CHOL, HDL, LDLCALC, TRIG, CHOLHDL, LDLDIRECT in the last 72 hours. Thyroid Function Tests: No results for input(s): TSH, T4TOTAL, FREET4, T3FREE, THYROIDAB in the last 72 hours. Anemia Panel: No results for input(s): VITAMINB12, FOLATE, FERRITIN, TIBC, IRON, RETICCTPCT in the last 72 hours. Urine analysis:    Component Value Date/Time   COLORURINE YELLOW 09/27/2015 1050   APPEARANCEUR CLEAR 09/27/2015 1050   LABSPEC >1.046 (H) 09/27/2015 1050   PHURINE 6.0 09/27/2015 1050   GLUCOSEU NEGATIVE 09/27/2015 1050   HGBUR NEGATIVE 09/27/2015 1050   BILIRUBINUR NEGATIVE 09/27/2015 1050   KETONESUR NEGATIVE 09/27/2015 1050   PROTEINUR NEGATIVE 09/27/2015 1050   UROBILINOGEN 0.2 09/05/2012 0839   NITRITE NEGATIVE 09/27/2015 1050   LEUKOCYTESUR NEGATIVE 09/27/2015 1050   Sepsis Labs: @LABRCNTIP (procalcitonin:4,lacticidven:4) )No results found for this or any previous visit (from the past 240 hour(s)).   Radiological Exams on Admission: No results  found.   Assessment/Plan Principal Problem:   Sepsis ([place]) Active Problems:   Diastolic CHF (HCC)   Atrial flutter (HCC)   HTN (hypertension)   Chronic diastolic heart failure (HCC)   Essential hypertension   Cellulitis of left leg   Cellulitis    1. Sepsis from left leg cellulitis - patient has been placed on vancomycin and Zosyn. Closely observe any worsening at this time no signs of compartment syndrome. Patient recent Dopplers were negative for DVT. Follow lactate levels procalcitonin levels and blood cultures. Patient did receive fluid bolus in the ER hesitant to give further fluid given patient's edema. 2. Atrial flutter status post ablation on metoprolol and xarelto. Check EEG. 3. Chronic diastolic CHF - patient was given fluid bolus in the ER due to sepsis. We will recheck a lactate and if improved may have to reconsider starting Lasix. Patient also is third spacing from low albumin levels. 4. Chronic anemia - cause not clear. Will check anemia panel. 5. Hypertension - continue metoprolol. 6. History of drug abuse - check UDS.  EKG is pending. I have reviewed patient's old charts are labs.   DVT prophylaxis: Xarelto. Code Status: Full code.  Family Communication: Patient's son.  Disposition Plan: Home.  Consults called: None.  Admission status: Inpatient.    Rise Patience MD [facility] Pager [phone].  If 7PM-7AM, please contact night-coverage [email or web address] Password [facility]  01/22/2017, 6:15 AM

## 2017-01-22 NOTE — Progress Notes (Signed)
Pharmacy Antibiotic Note  Brian [name] is a 66 y.o. male admitted on 01/22/2017 with cellulitis.  Pharmacy has been consulted for Vancocin and Zosyn dosing.  Plan: Vanc 1250mg  and Zosyn 3.375g given in ED. Vancomycin 1000mg  IV every 12 hours.  Goal trough 10-15 mcg/mL. Zosyn 3.375g IV q8h (4 hour infusion).  Height: 5\' 10"  (177.8 cm) Weight: 239 lb 13.8 oz (108.8 kg) IBW/kg (Calculated) : 73  Temp (24hrs), Avg:98.5 F (36.9 C), Min:98.4 F (36.9 C), Max:98.5 F (36.9 C)   Recent Labs Lab 01/16/17 1410 01/16/17 1437 01/22/17 0031 01/22/17 0103  WBC 15.4*  --  19.1*  --   CREATININE 0.90  --  0.99  --   LATICACIDVEN  --  1.83  --  2.66*    Estimated Creatinine Clearance: 91.9 mL/min (by C-G formula based on SCr of 0.99 mg/dL).    Allergies  Allergen Reactions  . Tape Rash and Other (See Comments)    Paper tape is preferred, please!!     Thank you for allowing pharmacy to be a part of this patient's care.  Wynona Neat, PharmD, BCPS  01/22/2017 6:18 AM

## 2017-01-22 NOTE — ED Notes (Signed)
Dr Venora Maples made aware of the reason for the delay in iv and meds

## 2017-01-22 NOTE — ED Triage Notes (Signed)
Pt returns for increased swelling and redness to LLE; pt states he was seen here Sunday for same, dx with cellulitis and give rx for antibiotic; family has photos of improved legs but now worse

## 2017-01-22 NOTE — Progress Notes (Signed)
Pt admitted to [facility] from ED. Pt is A&OX4. Pt's friend at bedside. Pt has bruising to bilateral arms. Pt has a birthmark to rt leg and rt hip. Bilateral legs are discolored and swollen and left leg is red and marked for cellulitis. Pt would not let nurses look at his buttocks and groin (he prefers males to do his care). Pt let Tom, NT look at buttocks but not groin. Tom states buttocks has no breakdown and no redness. Pt has personal cane at bedside. Told pt to call for assistance. Will continue to monitor pt. Ranelle Oyster, RN

## 2017-01-22 NOTE — Progress Notes (Signed)
Notified Mindy,RN rapid response nurse per MD order. Mindy, RN stated she will put pt on their sepsis list. Will continue to monitor pt. Ranelle Oyster, RN

## 2017-01-22 NOTE — Progress Notes (Signed)
Patient seen and examined at bedside, patient admitted after midnight, please see earlier detailed admission note by Rise Patience, MD. Briefly, patient presented with sepsis secondary to cellulitis. Continue Vancomycin and Zosyn. Follow-up blood cultures.   Cordelia Poche, MD [facility] 01/22/2017, 10:24 AM Pager: [phone]

## 2017-01-23 DIAGNOSIS — I10 Essential (primary) hypertension: Secondary | ICD-10-CM

## 2017-01-23 DIAGNOSIS — L03116 Cellulitis of left lower limb: Secondary | ICD-10-CM

## 2017-01-23 DIAGNOSIS — I5032 Chronic diastolic (congestive) heart failure: Secondary | ICD-10-CM

## 2017-01-23 DIAGNOSIS — M1990 Unspecified osteoarthritis, unspecified site: Secondary | ICD-10-CM

## 2017-01-23 LAB — CBC
HEMATOCRIT: 26 % — AB (ref 39.0–52.0)
HEMOGLOBIN: 7.5 g/dL — AB (ref 13.0–17.0)
MCH: 22.9 pg — ABNORMAL LOW (ref 26.0–34.0)
MCHC: 28.8 g/dL — AB (ref 30.0–36.0)
MCV: 79.3 fL (ref 78.0–100.0)
Platelets: 518 10*3/uL — ABNORMAL HIGH (ref 150–400)
RBC: 3.28 MIL/uL — AB (ref 4.22–5.81)
RDW: 16.2 % — ABNORMAL HIGH (ref 11.5–15.5)
WBC: 14.1 10*3/uL — AB (ref 4.0–10.5)

## 2017-01-23 LAB — BASIC METABOLIC PANEL
ANION GAP: 5 (ref 5–15)
BUN: 16 mg/dL (ref 6–20)
CALCIUM: 8.3 mg/dL — AB (ref 8.9–10.3)
CO2: 29 mmol/L (ref 22–32)
Chloride: 101 mmol/L (ref 101–111)
Creatinine, Ser: 0.76 mg/dL (ref 0.61–1.24)
GLUCOSE: 108 mg/dL — AB (ref 65–99)
POTASSIUM: 4.6 mmol/L (ref 3.5–5.1)
SODIUM: 135 mmol/L (ref 135–145)

## 2017-01-23 MED ORDER — PREDNISONE 20 MG PO TABS
40.0000 mg | ORAL_TABLET | Freq: Every day | ORAL | Status: DC
  Administered 2017-01-24: 40 mg via ORAL
  Filled 2017-01-23: qty 2

## 2017-01-23 MED ORDER — METHYLPREDNISOLONE SODIUM SUCC 125 MG IJ SOLR
60.0000 mg | Freq: Once | INTRAMUSCULAR | Status: AC
  Administered 2017-01-23: 60 mg via INTRAVENOUS
  Filled 2017-01-23: qty 2

## 2017-01-23 MED ORDER — METHYLPREDNISOLONE SODIUM SUCC 125 MG IJ SOLR: 60.0000 mg | Freq: Every day | INTRAMUSCULAR | Status: DC

## 2017-01-23 NOTE — Progress Notes (Signed)
PROGRESS NOTE    Brian [name]  UEA:[id number] DOB: 1951/04/16 DOA: 01/22/2017 PCP: Orpah Melter, MD   Brief Narrative: Brian [name] is a 66 y.o. male with a history of atrial flutter status post ablation on Xarelto, hypertension, chronic diastolic pressure. Patient presented with left leg erythema concerning for cellulitis with associated SIRS criteria. Patient obtained blood cultures and started on broad spectrum antibiotics.   Assessment & Plan:   Principal Problem:   Sepsis ([place]) Active Problems:   Diastolic CHF ([place])   Atrial flutter (HCC)   HTN (hypertension)   Chronic diastolic heart failure (HCC)   Essential hypertension   Cellulitis of left leg   Cellulitis   Sepsis Met criteria on admission. Presumed secondary to cellulitis. -blood cultures pending -continue vancomycin/zosyn  Atrial flutter/fibrillation Rate controlled on metoprolol -continue metoprolol  Chronic diastolic heart failure Euvolemic.  Joint pain/hand swelling Seems consistent with arthritis, possible rheumatoid. Patient is supposed to follow-up with rheumatology for workup. Prednisone has helped in the past -methylprednisone x1 -prednisone daily starting tomorrow   DVT prophylaxis: Xarelto Code Status: Full code Family Communication: Sister at bedside Disposition Plan: Discharge likely in 24 hours   Consultants:   None  Procedures:   None  Antimicrobials:  Vancomycin  Zosyn    Subjective: Hand pain. Improved erythema of leg. Leg swelling  Objective: Vitals:   01/22/17 2109 01/23/17 0548 01/23/17 0935 01/23/17 1000  BP: (!) 106/56 (!) 118/57 131/67   Pulse: 90 99 96   Resp: 18 16    Temp: 99 F (37.2 C) 98.2 F (36.8 C)    TempSrc: Oral Oral    SpO2: 96% 98%    Weight:    133.6 kg (294 lb 9.6 oz)  Height:        Intake/Output Summary (Last 24 hours) at 01/23/17 1125 Last data filed at 01/23/17 0900  Gross per 24 hour  Intake              590 ml    Output             1070 ml  Net             -480 ml   Filed Weights   01/22/17 0600 01/23/17 1000  Weight: 108.8 kg (239 lb 13.8 oz) 133.6 kg (294 lb 9.6 oz)    Examination:  General exam: Appears calm and comfortable Respiratory system: Clear to auscultation. Respiratory effort normal. Cardiovascular system: S1 & S2 heard, RRR. No murmurs. Gastrointestinal system: Abdomen is nondistended, soft and nontender. Normal bowel sounds heard. Central nervous system: Alert and oriented. No focal neurological deficits. Extremities: No calf tenderness. Hands slightly swollen, tender. Left leg with minimal erythema. Skin: No cyanosis. No rashes Psychiatry: Judgement and insight appear normal. Mood & affect appropriate.     Data Reviewed: I have personally reviewed following labs and imaging studies  CBC:  Recent Labs Lab 01/16/17 1410 01/22/17 0031 01/22/17 0615 01/23/17 0449  WBC 15.4* 19.1* 17.6* 14.1*  NEUTROABS 11.8* 16.2* 14.4*  --   HGB 8.9* 8.1* 7.7* 7.5*  HCT 30.6* 28.3* 26.4* 26.0*  MCV 82.3 79.1 78.1 79.3  PLT 516* 612* 463* 478*   Basic Metabolic Panel:  Recent Labs Lab 01/16/17 1410 01/22/17 0031 01/22/17 0615 01/23/17 0449  NA 137 136 136 135  K 4.9 4.6 4.2 4.6  CL 101 99* 99* 101  CO2 _0 GLUCOSE 103* 136* 118* 108*  BUN 21* 31* 22* 16  CREATININE  0.90 0.99 0.80 0.76  CALCIUM 8.5* 8.9 8.3* 8.3*   GFR: Estimated Creatinine Clearance: 126.6 mL/min (by C-G formula based on SCr of 0.76 mg/dL). Liver Function Tests:  Recent Labs Lab 01/16/17 1410 01/22/17 0031 01/22/17 0615  AST _0 ALT 18 17 15*  ALKPHOS 60 57 47  BILITOT 0.4 0.4 0.7  PROT 7.2 7.0 6.4*  ALBUMIN 2.8* 2.8* 2.5*   No results for input(s): LIPASE, AMYLASE in the last 168 hours. No results for input(s): AMMONIA in the last 168 hours. Coagulation Profile:  Recent Labs Lab 01/22/17 0615  INR 1.98   Cardiac Enzymes: No results for input(s): CKTOTAL, CKMB,  CKMBINDEX, TROPONINI in the last 168 hours. BNP (last 3 results) No results for input(s): PROBNP in the last 8760 hours. HbA1C: No results for input(s): HGBA1C in the last 72 hours. CBG: No results for input(s): GLUCAP in the last 168 hours. Lipid Profile: No results for input(s): CHOL, HDL, LDLCALC, TRIG, CHOLHDL, LDLDIRECT in the last 72 hours. Thyroid Function Tests: No results for input(s): TSH, T4TOTAL, FREET4, T3FREE, THYROIDAB in the last 72 hours. Anemia Panel: No results for input(s): VITAMINB12, FOLATE, FERRITIN, TIBC, IRON, RETICCTPCT in the last 72 hours. Sepsis Labs:  Recent Labs Lab 01/16/17 1437 01/22/17 0103 01/22/17 0615 01/22/17 0810  PROCALCITON  --   --  0.17  --   LATICACIDVEN 1.83 2.66* 0.8 0.9    No results found for this or any previous visit (from the past 240 hour(s)).       Radiology Studies: No results found.      Scheduled Meds: . docusate sodium  300 mg Oral QODAY  . famotidine  20 mg Oral BID  . metoprolol succinate  25 mg Oral Daily  . rivaroxaban  20 mg Oral Daily  . varenicline  1 mg Oral BID   Continuous Infusions: . piperacillin-tazobactam (ZOSYN)  IV Stopped (01/23/17 0758)  . vancomycin Stopped (01/23/17 0251)     LOS: 1 day     Cordelia Poche, MD [facility] 01/23/2017, 11:25 AM Pager: [phone]  If 7PM-7AM, please contact night-coverage [email or web address] Password [facility] 01/23/2017, 11:25 AM

## 2017-01-24 DIAGNOSIS — I4892 Unspecified atrial flutter: Secondary | ICD-10-CM

## 2017-01-24 DIAGNOSIS — I4891 Unspecified atrial fibrillation: Secondary | ICD-10-CM

## 2017-01-24 LAB — RAPID URINE DRUG SCREEN, HOSP PERFORMED
AMPHETAMINES: NOT DETECTED
BARBITURATES: NOT DETECTED
BENZODIAZEPINES: NOT DETECTED
Cocaine: NOT DETECTED
Opiates: POSITIVE — AB
TETRAHYDROCANNABINOL: NOT DETECTED

## 2017-01-24 MED ORDER — SULFAMETHOXAZOLE-TRIMETHOPRIM 800-160 MG PO TABS: 1.0000 | ORAL_TABLET | Freq: Two times a day (BID) | ORAL | Status: DC

## 2017-01-24 MED ORDER — CEPHALEXIN 500 MG PO CAPS
500.0000 mg | ORAL_CAPSULE | Freq: Three times a day (TID) | ORAL | Status: DC
  Administered 2017-01-24: 500 mg via ORAL
  Filled 2017-01-24: qty 1

## 2017-01-24 MED ORDER — PREDNISONE 20 MG PO TABS: ORAL_TABLET | ORAL | 0 refills | Status: DC

## 2017-01-24 MED ORDER — CEPHALEXIN 500 MG PO CAPS: 500.0000 mg | ORAL_CAPSULE | Freq: Three times a day (TID) | ORAL | 0 refills | Status: AC

## 2017-01-24 NOTE — Discharge Summary (Signed)
Physician Discharge Summary  Brian [name] DOB: [birth year] DOA: 01/22/2017  PCP: Orpah Melter, MD  Admit date: 01/22/2017 Discharge date: 01/24/2017  Admitted From: Home Disposition: Home  Recommendations for Outpatient Follow-up:  1. Follow up with PCP in 1 week 2. Please obtain BMP in one week; Repeat CBC in 2-3 days to recheck hemoglobin 3. Please follow up on the following pending results: Blood cultures  Home Health: None Equipment/Devices: None  Discharge Condition: Stable CODE STATUS: Full code Diet recommendation: Heart healthy   Brief/Interim Summary:  Admission HPI written by Rise Patience, MD   Chief Complaint: Left leg erythema and pain.  HPI: Brian [name] is a 66 y.o. male with history of atrial flutter status post ablation on first week of July 3 weeks ago and Xarelto who was admitted in June 1 week of this year for sepsis secondary to cellulitis of the left lower extremity presents with worsening pain and swelling of the left lower extremity over the last 5 days. Patient had come to the ER 5 days ago and was given IV antibiotics and Dopplers were negative for DVT and was discharged home on doxycycline. Patient states symptoms improved for 2-3 days but again started worsening. Patient presented back to the ER.   ED Course: In the ER patient is found to be tachycardic with leukocytosis and on exam patient has erythema and swelling of the left lower extremity and tenderness. Consistent with cellulitis but no definite signs of any compartment syndrome. Blood cultures (patient was given fluid bolus since lactate was elevated and admitted for further management of sepsis secondary to cellulitis of the left lower extremity.    Hospital course:  Sepsis Met criteria on admission. Presumed secondary to cellulitis. Empiric vancomycin and Zosyn were started. Blood cultures were obtained and were significant for no growth. Leukocytosis improving.  Patient transitioned to Keflex on discharge.  Cellulitis Initially managed with vancomycin and Zosyn. Transition to Keflex on discharge. Almost resolved before discharge.  Atrial flutter/fibrillation Rate controlled on metoprolol. Continued Xarelto.  Chronic diastolic heart failure Euvolemic.  Anemia This is been present since June. Trending down slightly. No evidence of bleeding. Unable to obtain fecal occult blood testing. Recommend outpatient fecal occult blood testing and likely gastroenterology follow-up. Recommend repeat CBC in 2-3 days  Joint pain/hand swelling Seems consistent with arthritis, possible rheumatoid. Patient is supposed to follow-up with rheumatology for workup. Prednisone has helped in the past. Gave patient a dose of Solu-Medrol which improved symptoms significantly. He will go home on a prednisone taper.   Discharge Diagnoses:  Principal Problem:   Sepsis ([place]) Active Problems:   Diastolic CHF ([place])   Atrial flutter ([facility])   HTN (hypertension)   Chronic diastolic heart failure ([facility])   Essential hypertension   Cellulitis of left leg   Cellulitis    Discharge Instructions  Discharge Instructions    Call MD for:  difficulty breathing, headache or visual disturbances    Complete by:  As directed    Call MD for:  persistant dizziness or light-headedness    Complete by:  As directed    Call MD for:  persistant nausea and vomiting    Complete by:  As directed    Call MD for:  severe uncontrolled pain    Complete by:  As directed    Call MD for:  temperature >100.4    Complete by:  As directed    Diet - low sodium heart healthy    Complete by:  As directed    Increase activity slowly    Complete by:  As directed      Allergies as of 01/24/2017      Reactions   Tape Rash, Other (See Comments)   Paper tape is preferred, please!!      Medication List    STOP taking these medications   acetaminophen 500 MG tablet Commonly known as:  TYLENOL    doxycycline 100 MG capsule Commonly known as:  VIBRAMYCIN   famotidine 40 MG tablet Commonly known as:  PEPCID   oxyCODONE-acetaminophen 7.5-325 MG tablet Commonly known as:  PERCOCET     TAKE these medications   cephALEXin 500 MG capsule Commonly known as:  KEFLEX Take 1 capsule (500 mg total) by mouth every 8 (eight) hours.   CHANTIX STARTING MONTH PAK 0.5 MG X 11 & 1 MG X 42 tablet Generic drug:  varenicline Take 1 mg by mouth 2 (two) times daily.   docusate sodium 100 MG capsule Commonly known as:  COLACE Take 300 mg by mouth every other day.   furosemide 40 MG tablet Commonly known as:  LASIX Take 80 mg by mouth 2 (two) times daily.   HYDROcodone-acetaminophen 5-325 MG tablet Commonly known as:  NORCO/VICODIN Take 1 tablet by mouth every 4 (four) hours as needed.   hydrOXYzine 25 MG tablet Commonly known as:  ATARAX/VISTARIL Take 25 mg by mouth daily as needed for anxiety.   metoprolol succinate 25 MG 24 hr tablet Commonly known as:  TOPROL XL Take 1 tablet (25 mg total) by mouth daily.   potassium chloride SA 20 MEQ tablet Commonly known as:  K-DUR,KLOR-CON Take 1 tablet (20 mEq total) by mouth daily.   predniSONE 20 MG tablet Commonly known as:  DELTASONE Take 2 tablets (53m) daily for 3 days, then take 1 tablet (243m daily for 2 days, then take 0.5 tablets (1050mdaily for 2 days.   ranitidine 150 MG tablet Commonly known as:  ZANTAC Take 150 mg by mouth daily.   rivaroxaban 20 MG Tabs tablet Commonly known as:  XARELTO Take 1 tablet (20 mg total) by mouth daily with supper. What changed:  when to take this       Allergies  Allergen Reactions  . Tape Rash and Other (See Comments)    Paper tape is preferred, please!!    Consultations:  None   Procedures/Studies:  No results found.   Subjective: Swelling improved. Afebrile overnight.  Discharge Exam: Vitals:   01/24/17 0541 01/24/17 0910  BP: 131/75 (!) 142/62  Pulse: 87 88   Resp: 18   Temp: 97.6 F (36.4 C)    Vitals:   01/23/17 1512 01/23/17 2120 01/24/17 0541 01/24/17 0910  BP: 119/84 131/70 131/75 (!) 142/62  Pulse: 98 95 87 88  Resp:  16 18   Temp: 98.4 F (36.9 C) 97.9 F (36.6 C) 97.6 F (36.4 C)   TempSrc: Oral Oral Oral   SpO2: 96% 96% 99%   Weight:   116 kg (255 lb 12.8 oz)   Height:        General: Pt is alert, awake, not in acute distress Cardiovascular: RRR, S1/S2 +, no rubs, no gallops Respiratory: CTA bilaterally, no wheezing, no rhonchi Abdominal: Soft, NT, ND, bowel sounds + Extremities: Trace edema bilaterally, no cyanosis Skin: Left leg erythema significant improved. No extension beyond drawn borders.    The results of significant diagnostics from this hospitalization (including imaging, microbiology, ancillary and laboratory) are listed below  for reference.     Microbiology: Recent Results (from the past 240 hour(s))  Blood culture (routine x 2)     Status: None (Preliminary result)   Collection Time: 01/22/17 12:20 AM  Result Value Ref Range Status   Specimen Description BLOOD RIGHT ANTECUBITAL  Final   Special Requests   Final    BOTTLES DRAWN AEROBIC AND ANAEROBIC Blood Culture adequate volume   Culture NO GROWTH 1 DAY  Final   Report Status PENDING  Incomplete  Blood culture (routine x 2)     Status: None (Preliminary result)   Collection Time: 01/22/17 12:40 AM  Result Value Ref Range Status   Specimen Description BLOOD LEFT ANTECUBITAL  Final   Special Requests   Final    IN PEDIATRIC BOTTLE Blood Culture results may not be optimal due to an excessive volume of blood received in culture bottles   Culture NO GROWTH 1 DAY  Final   Report Status PENDING  Incomplete     Labs: BNP (last 3 results)  Recent Labs  09/30/16 1222 11/11/16 1008 11/30/16 0023  BNP 153.5* 160.7* 703.5*   Basic Metabolic Panel:  Recent Labs Lab 01/22/17 0031 01/22/17 0615 01/23/17 0449  NA 136 136 135  K 4.6 4.2 4.6  CL  99* 99* 101  CO2 '27 29 29  ' GLUCOSE 136* 118* 108*  BUN 31* 22* 16  CREATININE 0.99 0.80 0.76  CALCIUM 8.9 8.3* 8.3*   Liver Function Tests:  Recent Labs Lab 01/22/17 0031 01/22/17 0615  AST 21 16  ALT 17 15*  ALKPHOS 57 47  BILITOT 0.4 0.7  PROT 7.0 6.4*  ALBUMIN 2.8* 2.5*   No results for input(s): LIPASE, AMYLASE in the last 168 hours. No results for input(s): AMMONIA in the last 168 hours. CBC:  Recent Labs Lab 01/22/17 0031 01/22/17 0615 01/23/17 0449  WBC 19.1* 17.6* 14.1*  NEUTROABS 16.2* 14.4*  --   HGB 8.1* 7.7* 7.5*  HCT 28.3* 26.4* 26.0*  MCV 79.1 78.1 79.3  PLT 612* 463* 518*   Cardiac Enzymes: No results for input(s): CKTOTAL, CKMB, CKMBINDEX, TROPONINI in the last 168 hours. BNP: Invalid input(s): POCBNP CBG: No results for input(s): GLUCAP in the last 168 hours. D-Dimer No results for input(s): DDIMER in the last 72 hours. Hgb A1c No results for input(s): HGBA1C in the last 72 hours. Lipid Profile No results for input(s): CHOL, HDL, LDLCALC, TRIG, CHOLHDL, LDLDIRECT in the last 72 hours. Thyroid function studies No results for input(s): TSH, T4TOTAL, T3FREE, THYROIDAB in the last 72 hours.  Invalid input(s): FREET3 Anemia work up No results for input(s): VITAMINB12, FOLATE, FERRITIN, TIBC, IRON, RETICCTPCT in the last 72 hours. Urinalysis    Component Value Date/Time   COLORURINE YELLOW 09/27/2015 1050   APPEARANCEUR CLEAR 09/27/2015 1050   LABSPEC >1.046 (H) 09/27/2015 1050   PHURINE 6.0 09/27/2015 1050   GLUCOSEU NEGATIVE 09/27/2015 1050   HGBUR NEGATIVE 09/27/2015 1050   BILIRUBINUR NEGATIVE 09/27/2015 1050   KETONESUR NEGATIVE 09/27/2015 1050   PROTEINUR NEGATIVE 09/27/2015 1050   UROBILINOGEN 0.2 09/05/2012 0839   NITRITE NEGATIVE 09/27/2015 1050   LEUKOCYTESUR NEGATIVE 09/27/2015 1050   Sepsis Labs Invalid input(s): PROCALCITONIN,  WBC,  LACTICIDVEN Microbiology Recent Results (from the past 240 hour(s))  Blood culture  (routine x 2)     Status: None (Preliminary result)   Collection Time: 01/22/17 12:20 AM  Result Value Ref Range Status   Specimen Description BLOOD RIGHT ANTECUBITAL  Final   Special  Requests   Final    BOTTLES DRAWN AEROBIC AND ANAEROBIC Blood Culture adequate volume   Culture NO GROWTH 1 DAY  Final   Report Status PENDING  Incomplete  Blood culture (routine x 2)     Status: None (Preliminary result)   Collection Time: 01/22/17 12:40 AM  Result Value Ref Range Status   Specimen Description BLOOD LEFT ANTECUBITAL  Final   Special Requests   Final    IN PEDIATRIC BOTTLE Blood Culture results may not be optimal due to an excessive volume of blood received in culture bottles   Culture NO GROWTH 1 DAY  Final   Report Status PENDING  Incomplete    SIGNED:   Cordelia Poche, MD [facility] 01/24/2017, 10:17 AM Pager [phone]  If 7PM-7AM, please contact night-coverage [email or web address] Password [email or web address]

## 2017-01-24 NOTE — Progress Notes (Signed)
Brian [name] to be D/C'd Home per MD order.  Discussed with the patient and all questions fully answered.  VSS, Skin clean, dry and intact without evidence of skin break down, no evidence of skin tears noted. IV catheter discontinued intact. Site without signs and symptoms of complications. Dressing and pressure applied.  An After Visit Summary was printed and given to the patient. Patient received prescription.  D/c education completed with patient/family including follow up instructions, medication list, d/c activities limitations if indicated, with other d/c instructions as indicated by MD - patient able to verbalize understanding, all questions fully answered.   Patient instructed to return to ED, call 911, or call MD for any changes in condition.   Patient escorted via [place], and D/C home via private auto.  [name] 01/24/2017 1:35 PM

## 2017-01-27 LAB — CULTURE, BLOOD (ROUTINE X 2)
Culture: NO GROWTH
Culture: NO GROWTH
SPECIAL REQUESTS: ADEQUATE

## 2017-02-01 ENCOUNTER — Encounter: Payer: Self-pay | Admitting: Internal Medicine

## 2017-02-01 ENCOUNTER — Ambulatory Visit (INDEPENDENT_AMBULATORY_CARE_PROVIDER_SITE_OTHER): Payer: PPO | Admitting: Internal Medicine

## 2017-02-01 VITALS — BP 148/76 | HR 74 | Ht 70.0 in | Wt 248.0 lb

## 2017-02-01 DIAGNOSIS — I10 Essential (primary) hypertension: Secondary | ICD-10-CM

## 2017-02-01 DIAGNOSIS — I5032 Chronic diastolic (congestive) heart failure: Secondary | ICD-10-CM | POA: Diagnosis not present

## 2017-02-01 DIAGNOSIS — I480 Paroxysmal atrial fibrillation: Secondary | ICD-10-CM

## 2017-02-01 NOTE — Patient Instructions (Signed)
Medication Instructions: Your physician recommends that you continue on your current medications as directed. Please refer to the Current Medication list given to you today.    Labwork: None ordered  Procedures/Testing: Non ordered  Follow-Up: Your physician recommends that you schedule a follow-up appointment in: 6 weeks with Roderic Palau in the Afib Clinic  Any Additional Special Instructions Will Be Listed Below (If Applicable).     If you need a refill on your cardiac medications before your next appointment, please call your pharmacy.

## 2017-02-01 NOTE — Progress Notes (Signed)
PCP: Orpah Melter, MD  Primary EP: Dr Drema Dallas is a 66 y.o. male who presents today for routine electrophysiology followup.  Since his recent atrial flutter ablation, the patient reports doing very well.  Denies procedure related complications.  He was recent admitted for recurrent cellulitis.  He has been in afib with this.  Today, he denies symptoms of palpitations, chest pain, shortness of breath,  lower extremity edema, dizziness, presyncope, or syncope.  The patient is otherwise without complaint today.   Past Medical History:  Diagnosis Date  . Anxiety   . Arthritis   . Atrial flutter ([place])    a. 08/2012  . CHF (congestive heart failure) ([place])   . Gout   . Hypertension   . Obesity   . Pneumonia   . Shortness of breath   . Stroke ([place])   . Tobacco abuse    Past Surgical History:  Procedure Laterality Date  . A-FLUTTER ABLATION N/A 12/29/2016   Procedure: A-Flutter Ablation;  Surgeon: [name], MD;  Location: [facility];  Service: Cardiovascular;  Laterality: N/A;  . CARDIOVERSION N/A 11/05/2016   Procedure: CARDIOVERSION;  Surgeon: Sueanne Margarita, MD;  Location: [facility];  Service: Cardiovascular;  Laterality: N/A;  . IR THORACENTESIS ASP PLEURAL SPACE W/IMG GUIDE  10/01/2016  . MICROLARYNGOSCOPY  09/02/2007   with excision of right vocal cord mass, Dr. Constance Holster    ROS- all systems are reviewed and negatives except as per HPI above  Current Outpatient Prescriptions  Medication Sig Dispense Refill  . bisacodyl (DULCOLAX) 5 MG EC tablet Take 5 mg by mouth daily as needed for moderate constipation.    . CHANTIX STARTING MONTH PAK 0.5 MG X 11 & 1 MG X 42 tablet Take 1 mg by mouth 2 (two) times daily.     . furosemide (LASIX) 40 MG tablet Take 80 mg by mouth 2 (two) times daily.    Marland Kitchen HYDROcodone-acetaminophen (NORCO/VICODIN) 5-325 MG tablet Take 1 tablet by mouth every 4 (four) hours as needed. 10 tablet 0  . hydrOXYzine (ATARAX/VISTARIL) 25 MG  tablet Take 25 mg by mouth daily as needed for anxiety.    . metoprolol succinate (TOPROL XL) 25 MG 24 hr tablet Take 1 tablet (25 mg total) by mouth daily. 30 tablet 6  . oxyCODONE-acetaminophen (PERCOCET) 7.5-325 MG tablet Take 1 tablet by mouth daily.    . potassium chloride SA (K-DUR,KLOR-CON) 20 MEQ tablet Take 1 tablet (20 mEq total) by mouth daily. 90 tablet 3  . predniSONE (DELTASONE) 20 MG tablet Take 2 tablets (40mg ) daily for 3 days, then take 1 tablet (20mg ) daily for 2 days, then take 0.5 tablets (10mg ) daily for 2 days. 9 tablet 0  . ranitidine (ZANTAC) 150 MG tablet Take 150 mg by mouth daily.    . rivaroxaban (XARELTO) 20 MG TABS tablet Take 1 tablet (20 mg total) by mouth daily with supper. (Patient taking differently: Take 20 mg by mouth daily. ) 30 tablet 3   No current facility-administered medications for this visit.     Physical Exam: Vitals:   02/01/17 0847  BP: (!) 148/76  Pulse: 74  SpO2: 97%  Weight: 248 lb (112.5 kg)  Height: 5\' 10"  (1.778 m)    GEN- The patient is well appearing, alert and oriented x 3 today.   Head- normocephalic, atraumatic Eyes-  Sclera clear, conjunctiva pink Ears- hearing intact Oropharynx- clear Lungs- Clear to ausculation bilaterally, normal work of breathing Heart-  Regular rate and rhythm, no murmurs, rubs or gallops, PMI not laterally displaced GI- soft, NT, ND, + BS Extremities- no clubbing, cyanosis, or edema  EKG tracing ordered today is personally reviewed and shows sinus rhythm  Assessment and Plan:  1. Paroxysmal atrial fibrillation New onset in the setting of cellulitis chads2vasc score is 5.  Continue on xarelto. Risks of medicine discussed with the patient (bleeding) and he wishes to continue at this time. If his afib resolves, could consider ILR to follow for asymptomatic afib in the future to better determine if long term anticoagulation is required.  2. Atrial flutter resolved S/p ablation  3.  HTN Elevated He does not wish to make changes today Consider losartan if remains elevated  4. Chronic diastolic CHF Sodium restriction No medicine changes today  Follow-up in AF clinic in 6 weeks I will see when needed  [name] MD, [facility] 02/01/2017 9:18 AM

## 2017-02-16 DIAGNOSIS — M2550 Pain in unspecified joint: Secondary | ICD-10-CM | POA: Diagnosis not present

## 2017-02-16 DIAGNOSIS — R5382 Chronic fatigue, unspecified: Secondary | ICD-10-CM | POA: Diagnosis not present

## 2017-02-16 DIAGNOSIS — M0579 Rheumatoid arthritis with rheumatoid factor of multiple sites without organ or systems involvement: Secondary | ICD-10-CM | POA: Diagnosis not present

## 2017-02-16 DIAGNOSIS — E669 Obesity, unspecified: Secondary | ICD-10-CM | POA: Diagnosis not present

## 2017-02-16 DIAGNOSIS — Z6836 Body mass index (BMI) 36.0-36.9, adult: Secondary | ICD-10-CM | POA: Diagnosis not present

## 2017-02-24 DIAGNOSIS — M2550 Pain in unspecified joint: Secondary | ICD-10-CM | POA: Diagnosis not present

## 2017-02-24 DIAGNOSIS — E669 Obesity, unspecified: Secondary | ICD-10-CM | POA: Diagnosis not present

## 2017-02-24 DIAGNOSIS — M0579 Rheumatoid arthritis with rheumatoid factor of multiple sites without organ or systems involvement: Secondary | ICD-10-CM | POA: Diagnosis not present

## 2017-02-24 DIAGNOSIS — M25532 Pain in left wrist: Secondary | ICD-10-CM | POA: Diagnosis not present

## 2017-02-24 DIAGNOSIS — Z6835 Body mass index (BMI) 35.0-35.9, adult: Secondary | ICD-10-CM | POA: Diagnosis not present

## 2017-02-24 DIAGNOSIS — R5382 Chronic fatigue, unspecified: Secondary | ICD-10-CM | POA: Diagnosis not present

## 2017-03-18 ENCOUNTER — Encounter (HOSPITAL_COMMUNITY): Payer: Self-pay | Admitting: Nurse Practitioner

## 2017-03-18 ENCOUNTER — Ambulatory Visit (HOSPITAL_COMMUNITY)
Admission: RE | Admit: 2017-03-18 | Discharge: 2017-03-18 | Disposition: A | Discharge status: Home / Self Care | Payer: PPO | Source: Ambulatory Visit | Attending: Nurse Practitioner | Admitting: Nurse Practitioner

## 2017-03-18 VITALS — BP 126/72 | HR 125 | Ht 70.0 in | Wt 250.0 lb

## 2017-03-18 DIAGNOSIS — I483 Typical atrial flutter: Secondary | ICD-10-CM | POA: Insufficient documentation

## 2017-03-18 DIAGNOSIS — I110 Hypertensive heart disease with heart failure: Secondary | ICD-10-CM | POA: Insufficient documentation

## 2017-03-18 DIAGNOSIS — I4819 Other persistent atrial fibrillation: Secondary | ICD-10-CM

## 2017-03-18 DIAGNOSIS — I509 Heart failure, unspecified: Secondary | ICD-10-CM | POA: Insufficient documentation

## 2017-03-18 DIAGNOSIS — Z8673 Personal history of transient ischemic attack (TIA), and cerebral infarction without residual deficits: Secondary | ICD-10-CM | POA: Diagnosis not present

## 2017-03-18 DIAGNOSIS — Z7901 Long term (current) use of anticoagulants: Secondary | ICD-10-CM | POA: Insufficient documentation

## 2017-03-18 DIAGNOSIS — I481 Persistent atrial fibrillation: Secondary | ICD-10-CM

## 2017-03-18 DIAGNOSIS — I5032 Chronic diastolic (congestive) heart failure: Secondary | ICD-10-CM | POA: Diagnosis not present

## 2017-03-18 DIAGNOSIS — M109 Gout, unspecified: Secondary | ICD-10-CM | POA: Diagnosis not present

## 2017-03-18 DIAGNOSIS — I4892 Unspecified atrial flutter: Secondary | ICD-10-CM | POA: Diagnosis not present

## 2017-03-18 DIAGNOSIS — Z87891 Personal history of nicotine dependence: Secondary | ICD-10-CM | POA: Diagnosis not present

## 2017-03-18 DIAGNOSIS — F419 Anxiety disorder, unspecified: Secondary | ICD-10-CM | POA: Insufficient documentation

## 2017-03-18 DIAGNOSIS — E669 Obesity, unspecified: Secondary | ICD-10-CM | POA: Diagnosis not present

## 2017-03-18 DIAGNOSIS — I4891 Unspecified atrial fibrillation: Secondary | ICD-10-CM | POA: Insufficient documentation

## 2017-03-18 DIAGNOSIS — I480 Paroxysmal atrial fibrillation: Secondary | ICD-10-CM

## 2017-03-18 MED ORDER — METOPROLOL SUCCINATE ER 25 MG PO TB24: 25.0000 mg | ORAL_TABLET | Freq: Two times a day (BID) | ORAL | 6 refills | Status: DC

## 2017-03-18 NOTE — Patient Instructions (Signed)
Your physician has recommended you make the following change in your medication:  1)Increase metoprolol to 25mg  twice a day

## 2017-03-18 NOTE — Progress Notes (Signed)
Primary Care Physician: Orpah Melter, MD Referring Physician: Dr. Johnsie Cancel Cardiologist: Dr. Marlou Porch( previous Dr. Mare Ferrari)   Brian [name] is a 66 y.o. male with a long standing h/o atrial flutter, diastolic heart failure, former Dr. Mare Ferrari patient with anxiety. He was  in the hospital  for pneumonia and aflutter with rvr. Dr. Marlou Porch saw on consult in the hospital on 10/01/2016 when he was in atrial flutter. IV amiodarone was utilized. Less than 24 hours he converted. He was sent home on by mouth amiodarone 200 mg once a day and Xarelto. He states that he has not missed a dose of Xarelto. He states that his lower extremities remain edematous like they were prior to his hospitalization. He is taking twice as much Lasix, 40 mg twice a day. When he was seen f/u by Dr. Marlou Porch , he continues in flutter. He was scheduled for cardioversion for 5/10. He did shock out but only stayed in rhythm for a few minutes.Amiodarone was kept at 200 mg bid. He did go top the ED 5/26 for painful leg and r/o for DVT.   In  the afib clinic 11/13/16, he remained in flutter rate controlled.  He denied alcohol use. Positive for tobacco use. Denies snoring history. Review  of EKG's from 2014 shows all atrial flutter that I could see and it appeared  Typical.  Pt saw Dr. Rayann Heman in consult and he had an atrial flutter ablation 12/29/16. Several weeks later he developed cellulitis of his leg. He saw Dr. Rayann Heman  8/6 and was in afib but he thought it may resolve when the cellulitis treatment was finished.  He is in the afib clinic for f/u today, 920 and the pt is in afib at 125 bpm. He feels that he has been in afib since that appointment. He reports being tired and only has energy for the slightest activity.   Today, he denies symptoms of palpitations, chest pain, shortness of breath, orthopnea, PND, lower extremity edema, dizziness, presyncope, syncope, or neurologic sequela. Positive for fatigue. The patient is tolerating  medications without difficulties and is otherwise without complaint today.   Past Medical History:  Diagnosis Date  . Anxiety   . Arthritis   . Atrial flutter ([place])    a. 08/2012  . CHF (congestive heart failure) ([place])   . Gout   . Hypertension   . Obesity   . Pneumonia   . Shortness of breath   . Stroke ([place])   . Tobacco abuse    Past Surgical History:  Procedure Laterality Date  . A-FLUTTER ABLATION N/A 12/29/2016   Procedure: A-Flutter Ablation;  Surgeon: [name], MD;  Location: [facility];  Service: Cardiovascular;  Laterality: N/A;  . CARDIOVERSION N/A 11/05/2016   Procedure: CARDIOVERSION;  Surgeon: Sueanne Margarita, MD;  Location: [facility];  Service: Cardiovascular;  Laterality: N/A;  . IR THORACENTESIS ASP PLEURAL SPACE W/IMG GUIDE  10/01/2016  . MICROLARYNGOSCOPY  09/02/2007   with excision of right vocal cord mass, Dr. Constance Holster    Current Outpatient Prescriptions  Medication Sig Dispense Refill  . bisacodyl (DULCOLAX) 5 MG EC tablet Take 5 mg by mouth daily as needed for moderate constipation.    . furosemide (LASIX) 40 MG tablet Take 80 mg by mouth 2 (two) times daily.    . metoprolol succinate (TOPROL XL) 25 MG 24 hr tablet Take 1 tablet (25 mg total) by mouth 2 (two) times daily. 30 tablet 6  . potassium chloride SA (K-DUR,KLOR-CON)  20 MEQ tablet Take 1 tablet (20 mEq total) by mouth daily. 90 tablet 3  . ranitidine (ZANTAC) 150 MG tablet Take 150 mg by mouth daily.    . rivaroxaban (XARELTO) 20 MG TABS tablet Take 1 tablet (20 mg total) by mouth daily with supper. (Patient taking differently: Take 20 mg by mouth daily. ) 30 tablet 3  . HYDROcodone-acetaminophen (NORCO/VICODIN) 5-325 MG tablet Take 1 tablet by mouth every 4 (four) hours as needed. (Patient not taking: Reported on 03/18/2017) 10 tablet 0  . hydrOXYzine (ATARAX/VISTARIL) 25 MG tablet Take 25 mg by mouth daily as needed for anxiety.    Marland Kitchen oxyCODONE-acetaminophen (PERCOCET) 7.5-325 MG tablet Take 1  tablet by mouth daily.     No current facility-administered medications for this encounter.     Allergies  Allergen Reactions  . Tape Rash and Other (See Comments)    Paper tape is preferred, please!!    Social History   Social History  . Marital status: Divorced    Spouse name: N/A  . Number of children: 3  . Years of education: N/A   Occupational History  . Not on file.   Social History Main Topics  . Smoking status: Former Smoker    Packs/day: 1.00    Years: 50.00    Types: Cigarettes  . Smokeless tobacco: Never Used  . Alcohol use No     Comment: socially  . Drug use: No  . Sexual activity: Not Currently   Other Topics Concern  . Not on file   Social History Narrative   Lives in [place]    Works at a convenience store   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   Divorced with 3 children and 9 grandchildren, he states ex-wife is still a friend    Family History  Problem Relation Age of Onset  . Cancer Mother   . Heart disease Father   . Heart attack Father   . Cancer Father   . Diabetes Sister     ROS- All systems are reviewed and negative except as per the HPI above  Physical Exam: Vitals:   03/18/17 1519  BP: 126/72  Pulse: (!) 125  SpO2: 95%  Weight: 250 lb (113.4 kg)  Height: 5\' 10"  (1.778 m)   Wt Readings from Last 3 Encounters:  03/18/17 250 lb (113.4 kg)  02/01/17 248 lb (112.5 kg)  01/24/17 255 lb 12.8 oz (116 kg)    Labs: Lab Results  Component Value Date   NA 135 01/23/2017   K 4.6 01/23/2017   CL 101 01/23/2017   CO2 29 01/23/2017   GLUCOSE 108 (H) 01/23/2017   BUN 16 01/23/2017   CREATININE 0.76 01/23/2017   CALCIUM 8.3 (L) 01/23/2017   PHOS 2.5 10/01/2016   MG 1.9 12/03/2016   Lab Results  Component Value Date   INR 1.98 01/22/2017   Lab Results  Component Value Date   CHOL 135 09/28/2015   HDL 34 (L) 09/28/2015   LDLCALC 76 09/28/2015   TRIG 126 09/28/2015     GEN- The patient is well appearing, alert and oriented x  3 today.   Head- normocephalic, atraumatic Eyes-  Sclera clear, conjunctiva pink Ears- hearing intact Oropharynx- clear Neck- supple, no JVP Lymph- no cervical lymphadenopathy Lungs- Clear to ausculation bilaterally, normal work of breathing Heart- irregular rate and rhythm, no murmurs, rubs or gallops, PMI not laterally displaced GI- soft, NT, ND, + BS Extremities- no clubbing, cyanosis, or edema MS- no significant deformity  or atrophy Skin- no rash or lesion Psych- euthymic mood, full affect Neuro- strength and sensation are intact  EKG  afib at 125 bpm, qrs int 72 ms, qtc 444 ms Echo-Study Conclusions  - Left ventricle: The cavity size was normal. Systolic function was   normal. The estimated ejection fraction was in the range of 55%   to 60%. Wall motion was normal; there were no regional wall   motion abnormalities. Left ventricular diastolic function   parameters were normal. - Aortic valve: There was trivial regurgitation. Valve area (VTI):   1.79 cm^2. Valve area (Vmax): 2.01 cm^2. Valve area (Vmean): 2   cm^2. - Atrial septum: There was increased thickness of the septum,   consistent with lipomatous hypertrophy.     Assessment and Plan: 1. Typical atrial flutter Failed amiodarone to restore rhythm  S/p flutter ablation with return of SR for about a month Stopped amiodarone at time of flutter ablation  2. Onset of afib since early August Occurred in the setting of cellulitis of leg Cellulitis has resolved but afib has been persistent  Continue xarelto 20mg  for a chadsvasc score of 2 Increase metoprolol succinate to 25 mg bid for better rate control  I will discuss with Dr. Rayann Heman what plans are to be put in place to restore SR  Donna C. Carroll, [facility] [address] [place], [place] [address] [phone]

## 2017-03-19 DIAGNOSIS — R5382 Chronic fatigue, unspecified: Secondary | ICD-10-CM | POA: Diagnosis not present

## 2017-03-19 DIAGNOSIS — E669 Obesity, unspecified: Secondary | ICD-10-CM | POA: Diagnosis not present

## 2017-03-19 DIAGNOSIS — M2550 Pain in unspecified joint: Secondary | ICD-10-CM | POA: Diagnosis not present

## 2017-03-19 DIAGNOSIS — Z6836 Body mass index (BMI) 36.0-36.9, adult: Secondary | ICD-10-CM | POA: Diagnosis not present

## 2017-03-19 DIAGNOSIS — M0579 Rheumatoid arthritis with rheumatoid factor of multiple sites without organ or systems involvement: Secondary | ICD-10-CM | POA: Diagnosis not present

## 2017-03-19 DIAGNOSIS — M25532 Pain in left wrist: Secondary | ICD-10-CM | POA: Diagnosis not present

## 2017-03-22 ENCOUNTER — Telehealth: Payer: Self-pay | Admitting: Internal Medicine

## 2017-03-22 NOTE — Telephone Encounter (Signed)
Brian [name] is calling because he has not heard again about setting up another ablation . Please call

## 2017-03-23 NOTE — Telephone Encounter (Signed)
Called and talked to patient letting him know Butch Penny is waiting for recommendations on next steps for Dr. Rayann Heman. Patient stated he may be in the hospital by tomorrow due to anemia -- his hematologist stated his hgb had dropped from 11 to 8. He has a follow up with his PCP tomorrow to further workup. ER recommendations reviewed with patient should he developed chest pain, increased shortness of breath, lightheadedness or dizziness. Patient states his heart rate does increase with activity but recovers with rest.

## 2017-03-24 DIAGNOSIS — Z0000 Encounter for general adult medical examination without abnormal findings: Secondary | ICD-10-CM | POA: Diagnosis not present

## 2017-03-24 DIAGNOSIS — I10 Essential (primary) hypertension: Secondary | ICD-10-CM | POA: Diagnosis not present

## 2017-03-24 DIAGNOSIS — F419 Anxiety disorder, unspecified: Secondary | ICD-10-CM | POA: Diagnosis not present

## 2017-03-24 DIAGNOSIS — R531 Weakness: Secondary | ICD-10-CM | POA: Diagnosis not present

## 2017-03-25 DIAGNOSIS — Z872 Personal history of diseases of the skin and subcutaneous tissue: Secondary | ICD-10-CM | POA: Diagnosis not present

## 2017-03-25 DIAGNOSIS — I5030 Unspecified diastolic (congestive) heart failure: Secondary | ICD-10-CM | POA: Diagnosis not present

## 2017-03-25 DIAGNOSIS — Z7901 Long term (current) use of anticoagulants: Secondary | ICD-10-CM | POA: Diagnosis not present

## 2017-03-25 DIAGNOSIS — Z6836 Body mass index (BMI) 36.0-36.9, adult: Secondary | ICD-10-CM | POA: Diagnosis not present

## 2017-03-25 DIAGNOSIS — D72829 Elevated white blood cell count, unspecified: Secondary | ICD-10-CM | POA: Diagnosis not present

## 2017-03-25 DIAGNOSIS — I4892 Unspecified atrial flutter: Secondary | ICD-10-CM | POA: Diagnosis not present

## 2017-03-25 DIAGNOSIS — M069 Rheumatoid arthritis, unspecified: Secondary | ICD-10-CM | POA: Diagnosis not present

## 2017-03-25 DIAGNOSIS — D649 Anemia, unspecified: Secondary | ICD-10-CM | POA: Diagnosis not present

## 2017-03-25 NOTE — Telephone Encounter (Signed)
Received update from patient -- Hgb was 8 on recheck he was sent to a "specialist" today. Having a further workup to determine source of anemia. Instructed pt to keep clinic informed of progress and Dr. Rayann Heman recommends resolution of anemia and then proceed with cardioversion. Pt will call our office when testing completed to determine next steps.

## 2017-03-26 ENCOUNTER — Other Ambulatory Visit: Payer: Self-pay | Admitting: Cardiology

## 2017-03-26 NOTE — Addendum Note (Signed)
Encounter addended by: Sherran Needs, NP on: 03/26/2017  3:12 PM<BR>    Actions taken: LOS modified

## 2017-04-05 DIAGNOSIS — D649 Anemia, unspecified: Secondary | ICD-10-CM | POA: Diagnosis not present

## 2017-04-13 DIAGNOSIS — D649 Anemia, unspecified: Secondary | ICD-10-CM | POA: Diagnosis not present

## 2017-04-16 DIAGNOSIS — M0579 Rheumatoid arthritis with rheumatoid factor of multiple sites without organ or systems involvement: Secondary | ICD-10-CM | POA: Diagnosis not present

## 2017-04-16 DIAGNOSIS — M25532 Pain in left wrist: Secondary | ICD-10-CM | POA: Diagnosis not present

## 2017-04-16 DIAGNOSIS — M2550 Pain in unspecified joint: Secondary | ICD-10-CM | POA: Diagnosis not present

## 2017-04-16 DIAGNOSIS — E669 Obesity, unspecified: Secondary | ICD-10-CM | POA: Diagnosis not present

## 2017-04-16 DIAGNOSIS — Z6835 Body mass index (BMI) 35.0-35.9, adult: Secondary | ICD-10-CM | POA: Diagnosis not present

## 2017-04-16 DIAGNOSIS — R5382 Chronic fatigue, unspecified: Secondary | ICD-10-CM | POA: Diagnosis not present

## 2017-04-26 ENCOUNTER — Other Ambulatory Visit: Payer: Self-pay

## 2017-04-26 MED ORDER — FUROSEMIDE 40 MG PO TABS
80.0000 mg | ORAL_TABLET | Freq: Two times a day (BID) | ORAL | 1 refills | Status: DC
Start: 2017-04-26 — End: 2017-10-30

## 2017-04-27 DIAGNOSIS — D649 Anemia, unspecified: Secondary | ICD-10-CM | POA: Diagnosis not present

## 2017-04-27 DIAGNOSIS — M0579 Rheumatoid arthritis with rheumatoid factor of multiple sites without organ or systems involvement: Secondary | ICD-10-CM | POA: Diagnosis not present

## 2017-04-30 DIAGNOSIS — M2550 Pain in unspecified joint: Secondary | ICD-10-CM | POA: Diagnosis not present

## 2017-04-30 DIAGNOSIS — R5382 Chronic fatigue, unspecified: Secondary | ICD-10-CM | POA: Diagnosis not present

## 2017-04-30 DIAGNOSIS — E669 Obesity, unspecified: Secondary | ICD-10-CM | POA: Diagnosis not present

## 2017-04-30 DIAGNOSIS — M0579 Rheumatoid arthritis with rheumatoid factor of multiple sites without organ or systems involvement: Secondary | ICD-10-CM | POA: Diagnosis not present

## 2017-04-30 DIAGNOSIS — D508 Other iron deficiency anemias: Secondary | ICD-10-CM | POA: Diagnosis not present

## 2017-04-30 DIAGNOSIS — Z6832 Body mass index (BMI) 32.0-32.9, adult: Secondary | ICD-10-CM | POA: Diagnosis not present

## 2017-05-07 DIAGNOSIS — R768 Other specified abnormal immunological findings in serum: Secondary | ICD-10-CM | POA: Diagnosis not present

## 2017-05-10 ENCOUNTER — Other Ambulatory Visit: Payer: Self-pay

## 2017-05-10 DIAGNOSIS — I5032 Chronic diastolic (congestive) heart failure: Secondary | ICD-10-CM

## 2017-05-10 DIAGNOSIS — I4892 Unspecified atrial flutter: Secondary | ICD-10-CM

## 2017-05-10 MED ORDER — METOPROLOL SUCCINATE ER 25 MG PO TB24: 25.0000 mg | ORAL_TABLET | Freq: Two times a day (BID) | ORAL | 6 refills | Status: DC

## 2017-05-11 ENCOUNTER — Other Ambulatory Visit: Payer: Self-pay | Admitting: Cardiology

## 2017-05-11 DIAGNOSIS — I4892 Unspecified atrial flutter: Secondary | ICD-10-CM

## 2017-05-11 DIAGNOSIS — I5032 Chronic diastolic (congestive) heart failure: Secondary | ICD-10-CM

## 2017-05-11 MED ORDER — METOPROLOL SUCCINATE ER 25 MG PO TB24: 25.0000 mg | ORAL_TABLET | Freq: Two times a day (BID) | ORAL | 6 refills | Status: DC

## 2017-05-18 ENCOUNTER — Emergency Department (HOSPITAL_COMMUNITY)
Admission: EM | Admit: 2017-05-18 | Discharge: 2017-05-18 | Disposition: A | Discharge status: Home / Self Care | Payer: PPO | Attending: Emergency Medicine | Admitting: Emergency Medicine

## 2017-05-18 ENCOUNTER — Emergency Department (HOSPITAL_COMMUNITY): Payer: PPO

## 2017-05-18 ENCOUNTER — Emergency Department (HOSPITAL_BASED_OUTPATIENT_CLINIC_OR_DEPARTMENT_OTHER): Admit: 2017-05-18 | Discharge: 2017-05-18 | Disposition: A | Discharge status: Home / Self Care | Payer: PPO

## 2017-05-18 ENCOUNTER — Encounter (HOSPITAL_COMMUNITY): Payer: Self-pay | Admitting: Emergency Medicine

## 2017-05-18 DIAGNOSIS — R0602 Shortness of breath: Secondary | ICD-10-CM | POA: Diagnosis not present

## 2017-05-18 DIAGNOSIS — M25551 Pain in right hip: Secondary | ICD-10-CM | POA: Diagnosis not present

## 2017-05-18 DIAGNOSIS — I5032 Chronic diastolic (congestive) heart failure: Secondary | ICD-10-CM | POA: Insufficient documentation

## 2017-05-18 DIAGNOSIS — N179 Acute kidney failure, unspecified: Secondary | ICD-10-CM | POA: Diagnosis not present

## 2017-05-18 DIAGNOSIS — M7989 Other specified soft tissue disorders: Secondary | ICD-10-CM

## 2017-05-18 DIAGNOSIS — R55 Syncope and collapse: Secondary | ICD-10-CM | POA: Diagnosis not present

## 2017-05-18 DIAGNOSIS — Z7901 Long term (current) use of anticoagulants: Secondary | ICD-10-CM | POA: Diagnosis not present

## 2017-05-18 DIAGNOSIS — M25462 Effusion, left knee: Secondary | ICD-10-CM | POA: Insufficient documentation

## 2017-05-18 DIAGNOSIS — Z79899 Other long term (current) drug therapy: Secondary | ICD-10-CM | POA: Insufficient documentation

## 2017-05-18 DIAGNOSIS — M7122 Synovial cyst of popliteal space [Baker], left knee: Secondary | ICD-10-CM | POA: Diagnosis not present

## 2017-05-18 DIAGNOSIS — I110 Hypertensive heart disease with heart failure: Secondary | ICD-10-CM | POA: Diagnosis not present

## 2017-05-18 DIAGNOSIS — M160 Bilateral primary osteoarthritis of hip: Secondary | ICD-10-CM | POA: Insufficient documentation

## 2017-05-18 DIAGNOSIS — Z87891 Personal history of nicotine dependence: Secondary | ICD-10-CM | POA: Insufficient documentation

## 2017-05-18 DIAGNOSIS — M79609 Pain in unspecified limb: Secondary | ICD-10-CM | POA: Diagnosis not present

## 2017-05-18 DIAGNOSIS — Z8673 Personal history of transient ischemic attack (TIA), and cerebral infarction without residual deficits: Secondary | ICD-10-CM | POA: Diagnosis not present

## 2017-05-18 LAB — BASIC METABOLIC PANEL
ANION GAP: 13 (ref 5–15)
BUN: 21 mg/dL — ABNORMAL HIGH (ref 6–20)
CALCIUM: 9.7 mg/dL (ref 8.9–10.3)
CO2: 25 mmol/L (ref 22–32)
CREATININE: 1.42 mg/dL — AB (ref 0.61–1.24)
Chloride: 101 mmol/L (ref 101–111)
GFR, EST AFRICAN AMERICAN: 58 mL/min — AB (ref >60)
GFR, EST NON AFRICAN AMERICAN: 50 mL/min — AB (ref >60)
Glucose, Bld: 130 mg/dL — ABNORMAL HIGH (ref 65–99)
Potassium: 4.2 mmol/L (ref 3.5–5.1)
SODIUM: 139 mmol/L (ref 135–145)

## 2017-05-18 LAB — CBC
HCT: 43.7 % (ref 39.0–52.0)
HEMOGLOBIN: 13.1 g/dL (ref 13.0–17.0)
MCH: 26.4 pg (ref 26.0–34.0)
MCHC: 30 g/dL (ref 30.0–36.0)
MCV: 87.9 fL (ref 78.0–100.0)
PLATELETS: 375 10*3/uL (ref 150–400)
RBC: 4.97 MIL/uL (ref 4.22–5.81)
RDW: 22.7 % — ABNORMAL HIGH (ref 11.5–15.5)
WBC: 16.6 10*3/uL — AB (ref 4.0–10.5)

## 2017-05-18 LAB — URINALYSIS, ROUTINE W REFLEX MICROSCOPIC
GLUCOSE, UA: NEGATIVE mg/dL
Hgb urine dipstick: NEGATIVE
Ketones, ur: NEGATIVE mg/dL
Leukocytes, UA: NEGATIVE
NITRITE: NEGATIVE
PH: 5 (ref 5.0–8.0)
Protein, ur: 30 mg/dL — AB
SPECIFIC GRAVITY, URINE: 1.03 (ref 1.005–1.030)

## 2017-05-18 LAB — I-STAT TROPONIN, ED: Troponin i, poc: 0 ng/mL (ref 0.00–0.08)

## 2017-05-18 NOTE — ED Notes (Addendum)
Pt states, " I was at work, working [employer] and safety [employer]. I started feeling nauseous, dizzy, near syncopal with cold sweats. I did not lose consciousness." Pt states he has been having these episodes once a week for a couple of months. Pt reports these episodes only occur when he is exerting hisself. Pt complains of SHOB on exertion. Pt also complaining of R hip and L knee, recently diagnosed with arthritis in August.

## 2017-05-18 NOTE — ED Notes (Signed)
Vascular at bedside

## 2017-05-18 NOTE — ED Notes (Signed)
Lungs clear throughout.

## 2017-05-18 NOTE — Discharge Instructions (Signed)
Follow up with your primary care doctor to recheck your kidney function tests, make sure to stay hydrated, follow-up with your orthopedic doctor regarding her hip arthritis and knee effusions, return to the emergency room for worsening symptoms

## 2017-05-18 NOTE — ED Triage Notes (Signed)
Pt to ER for evaluation of near syncopal feeling, dizziness, shortness of breath, and nausea. States significant hx for RA. States just started humira yesterday and all symptoms besides nausea began since yesterday, pt is in NAD. A/o x4 VSS

## 2017-05-18 NOTE — ED Provider Notes (Signed)
[facility] EMERGENCY DEPARTMENT Provider Note   CSN: [id number] Arrival date & time: 05/18/17  1003     History   Chief Complaint Chief Complaint  Patient presents with  . Shortness of Breath  . Near Syncope    HPI Brian [name] is a 66 y.o. male.  HPI Pt went to work this am.  Around 6:30 am he started to feel nauseous, lightheaded, sweaty.  He felt like he was going to pass out.   He has been having these type of episodes, once a week for a few months. Nothing that thinks of triggers the symptoms.  He does get short of breath when it occurs.  He denies any chest pains.  No fever.  No vomiting or diarrhea.      He has had some intermittent pain in the right hip and the left knee.  No falls.  No recent injuries.  He does have rheumatoid arthritis and had a humira shot yesterday.  Past Medical History:  Diagnosis Date  . Anxiety   . Arthritis   . Atrial flutter ([place])    a. 08/2012  . CHF (congestive heart failure) ([place])   . Gout   . Hypertension   . Obesity   . Pneumonia   . Shortness of breath   . Stroke ([place])   . Tobacco abuse     Patient Active Problem List   Diagnosis Date Noted  . Cellulitis 01/22/2017  . Polyarthritis 12/01/2016  . Cellulitis of left leg 11/29/2016  . Sepsis ([place]) 11/29/2016  . Effusion, left knee 11/13/2016  . Persistent atrial fibrillation ([place])   . Dyspnea   . Positive urine drug screen   . CAP (community acquired pneumonia) 09/30/2016  . Community acquired pneumonia of left lower lobe of lung ([place])   . Pleural effusion   . Tachypnea   . CVA (cerebral infarction) 09/27/2015  . Right hand pain 09/27/2015  . History of gout 09/27/2015  . Cocaine abuse ([place]) 09/27/2015  . TIA (transient ischemic attack) 09/27/2015  . Slurred speech   . Primary osteoarthritis of right hand   . Leukocytosis   . Essential hypertension   . Chronic diastolic CHF (congestive heart failure) ([place])   . HTN (hypertension) 11/22/2013    . Chronic diastolic heart failure ([place]) 11/22/2013  . Noncompliance 11/17/2013  . Atrial flutter with rapid ventricular response ([place]) 11/16/2013  . Acute on chronic diastolic CHF (congestive heart failure) ([place]) 09/05/2012  . Tobacco abuse   . Obesity- BMI 35   . Diastolic CHF ([place])   . Atrial flutter ([place])   . Anxiety     Past Surgical History:  Procedure Laterality Date  . A-FLUTTER ABLATION N/A 12/29/2016   Procedure: A-Flutter Ablation;  Surgeon: [name], MD;  Location: [facility];  Service: Cardiovascular;  Laterality: N/A;  . CARDIOVERSION N/A 11/05/2016   Procedure: CARDIOVERSION;  Surgeon: Sueanne Margarita, MD;  Location: [facility];  Service: Cardiovascular;  Laterality: N/A;  . IR THORACENTESIS ASP PLEURAL SPACE W/IMG GUIDE  10/01/2016  . MICROLARYNGOSCOPY  09/02/2007   with excision of right vocal cord mass, Dr. Constance Holster       Home Medications    Prior to Admission medications   Medication Sig Start Date End Date Taking? Authorizing Provider  Adalimumab (HUMIRA) 40 MG/0.4ML PSKT Inject 40 mg into the skin every 14 (fourteen) days.   Yes [provider]  folic acid (FOLVITE) 1 MG tablet Take 1 mg by  mouth daily. 04/15/17  Yes [provider]  furosemide (LASIX) 40 MG tablet Take 2 tablets (80 mg total) by mouth 2 (two) times daily. 04/26/17  Yes Jerline Pain, MD  HYDROcodone-acetaminophen (NORCO/VICODIN) 5-325 MG tablet Take 1 tablet by mouth every 4 (four) hours as needed. 11/23/16  Yes Nona Dell, PA-C  hydrOXYzine (ATARAX/VISTARIL) 25 MG tablet Take 25 mg by mouth daily as needed for anxiety.   Yes [provider]  methotrexate (RHEUMATREX) 2.5 MG tablet Take 2.5 mg by mouth once a week. 04/26/17  Yes [provider]  metoprolol succinate (TOPROL XL) 25 MG 24 hr tablet Take 1 tablet (25 mg total) 2 (two) times daily by mouth. 05/11/17  Yes Skains, Thana Farr, MD  OVER THE COUNTER MEDICATION Take 4 tablets by mouth  every other day.   Yes [provider]  potassium chloride SA (K-DUR,KLOR-CON) 20 MEQ tablet Take 1 tablet (20 mEq total) by mouth daily. 10/26/16  Yes Jerline Pain, MD  predniSONE (DELTASONE) 10 MG tablet Take 30 mg by mouth daily.  04/26/17  Yes [provider]  ranitidine (ZANTAC) 150 MG tablet Take 150 mg by mouth daily.   Yes [provider]  XARELTO 20 MG TABS tablet TAKE 1 TABLET (20 MG TOTAL) BY MOUTH DAILY WITH SUPPER. 03/29/17  Yes Allred, Jeneen Rinks, MD    Family History Family History  Problem Relation Age of Onset  . Cancer Mother   . Heart disease Father   . Heart attack Father   . Cancer Father   . Diabetes Sister     Social History Social History   Tobacco Use  . Smoking status: Former Smoker    Packs/day: 1.00    Years: 50.00    Pack years: 50.00    Types: Cigarettes  . Smokeless tobacco: Never Used  Substance Use Topics  . Alcohol use: No    Comment: socially  . Drug use: No     Allergies   Tape   Review of Systems Review of Systems  All other systems reviewed and are negative.    Physical Exam Updated Vital Signs BP 116/64   Pulse 69   Temp 98.4 F (36.9 C) (Oral)   Resp (!) 24   SpO2 97%   Physical Exam  Constitutional: He appears well-developed and well-nourished. No distress.  HENT:  Head: Normocephalic and atraumatic.  Right Ear: External ear normal.  Left Ear: External ear normal.  Eyes: Conjunctivae are normal. Right eye exhibits no discharge. Left eye exhibits no discharge. No scleral icterus.  Neck: Neck supple. No tracheal deviation present.  Cardiovascular: Normal rate, regular rhythm and intact distal pulses.  Pulmonary/Chest: Effort normal and breath sounds normal. No stridor. No respiratory distress. He has no wheezes. He has no rales.  Abdominal: Soft. Bowel sounds are normal. He exhibits no distension. There is no tenderness. There is no rebound and no guarding.  Musculoskeletal:       Right hip:  He exhibits no tenderness.       Left knee: He exhibits effusion. Tenderness found.       Left lower leg: He exhibits swelling and edema.  Neurological: He is alert. He has normal strength. No cranial nerve deficit (no facial droop, extraocular movements intact, no slurred speech) or sensory deficit. He exhibits normal muscle tone. He displays no seizure activity. Coordination normal.  Skin: Skin is warm and dry. No rash noted.  Psychiatric: He has a normal mood and affect.  Nursing note and vitals reviewed.    ED Treatments / Results  Labs (all labs ordered are listed, but only abnormal results are displayed) Labs Reviewed  BASIC METABOLIC PANEL - Abnormal; Notable for the following components:      Result Value   Glucose, Bld 130 (*)    BUN 21 (*)    Creatinine, Ser 1.42 (*)    GFR calc non Af Amer 50 (*)    GFR calc Af Amer 58 (*)    All other components within normal limits  CBC - Abnormal; Notable for the following components:   WBC 16.6 (*)    RDW 22.7 (*)    All other components within normal limits  URINALYSIS, ROUTINE W REFLEX MICROSCOPIC - Abnormal; Notable for the following components:   Color, Urine AMBER (*)    APPearance CLOUDY (*)    Bilirubin Urine SMALL (*)    Protein, ur 30 (*)    Bacteria, UA RARE (*)    Squamous Epithelial / LPF 0-5 (*)    All other components within normal limits  I-STAT TROPONIN, ED    EKG  EKG Interpretation  Date/Time:  Tuesday May 18 2017 10:21:20 EST Ventricular Rate:  87 PR Interval:  144 QRS Duration: 72 QT Interval:  360 QTC Calculation: 433 R Axis:   79 Text Interpretation:  Normal sinus rhythm Normal ECG atrial fibrillation resolved compared to prior tracing Confirmed by Dorie Rank [phone]) on 05/18/2017 3:33:28 PM       Radiology Dg Chest 2 View  Result Date: 05/18/2017 CLINICAL DATA:  Worsening shortness of breath today. EXAM: CHEST  2 VIEW COMPARISON:  12/02/2016 FINDINGS: The cardiac silhouette,  mediastinal and hilar contours are stable. There is mild tortuosity and calcification of the thoracic aorta. The lungs are clear. No infiltrates, edema or effusions. The bony thorax is intact. IMPRESSION: No acute cardiopulmonary findings. Electronically Signed   By: Marijo Sanes M.D.   On: 05/18/2017 10:57   Dg Hip Unilat W Or Wo Pelvis 2-3 Views Right  Result Date: 05/18/2017 CLINICAL DATA:  66 y/o  M; right hip pain. EXAM: DG HIP (WITH OR WITHOUT PELVIS) 2-3V RIGHT COMPARISON:  None. FINDINGS: Severe osteoarthrosis of the hip joints bilaterally with loss of the superior joint space, mild flattening femoral heads, and sclerosis of articular surfaces. No acute fracture or pelvic diastases. Symphysis pubis and sacroiliac joints are maintained. IMPRESSION: Severe bilateral hip osteoarthrosis. Electronically Signed   By: Kristine Garbe M.D.   On: 05/18/2017 17:23   *PRELIMINARY RESULTS* Vascular Ultrasound Left lower extremity venous duplex has been completed.  Preliminary findings: No evidence of deep vein thrombosis in the visualized veins of the left lower extremity.  Left sided bakers cyst, 5.3cm.  Fluid noted medial left calf, area of pain and swelling-  rupture bakers cysts?  Fluid noted anterior lateral left knee.   Everrett Coombe 05/18/2017, 5:23 PM   Procedures Procedures (including critical care time)  Medications Ordered in ED Medications - No data to display   Initial Impression / Assessment and Plan / ED Course  I have reviewed the triage vital signs and the nursing notes.  Pertinent labs & imaging results that were available during my care of the patient were reviewed by me and considered in my medical decision making (see chart for details).  Clinical Course as of May 19 1751  Tue May 18, 2017  1540 Pt would prefer no IV fluids.  Denies nausea, vomiting or diarrhea.  Will  try oral fluids  [JK]    Clinical Course User Index [JK] Dorie Rank, MD   Patient  presented to the emergency room for evaluation of a near syncopal episode.  He has also been having pain associated with his arthritis in his hips and knees.  It is possible this may have been vasovagal, vs multifactorial.  No signs of infection.  WBC is elevated but chronic.  Chronic knee effusion, no signs of infection.  Doppler without DVT.  Cr is increased.  Pt denies nausea and vomiting.  Preferred oral rehydration.  Will need to have that rechecked.  Heart rate has remained stable.  No signs of dysrhythmia.  Discussed follow up with his orthopedic doctor regarding his hip and knee arthritis.  Final Clinical Impressions(s) / ED Diagnoses   Final diagnoses:  Near syncope  Acute kidney injury ([place])  Osteoarthritis of both hips, unspecified osteoarthritis type  Knee effusion, left  Baker's cyst of knee, left    ED Discharge Orders    None       Dorie Rank, MD 05/18/17 1755

## 2017-05-18 NOTE — Progress Notes (Signed)
*  PRELIMINARY RESULTS* Vascular Ultrasound Left lower extremity venous duplex has been completed.  Preliminary findings: No evidence of deep vein thrombosis in the visualized veins of the left lower extremity.  Left sided bakers cyst, 5.3cm.  Fluid noted medial left calf, area of pain and swelling-  rupture bakers cysts?  Fluid noted anterior lateral left knee.   Everrett Coombe 05/18/2017, 5:23 PM

## 2017-05-19 DIAGNOSIS — M2550 Pain in unspecified joint: Secondary | ICD-10-CM | POA: Diagnosis not present

## 2017-05-24 ENCOUNTER — Encounter (INDEPENDENT_AMBULATORY_CARE_PROVIDER_SITE_OTHER): Payer: Self-pay | Admitting: Orthopaedic Surgery

## 2017-05-24 ENCOUNTER — Ambulatory Visit (INDEPENDENT_AMBULATORY_CARE_PROVIDER_SITE_OTHER): Payer: PPO | Admitting: Orthopaedic Surgery

## 2017-05-24 DIAGNOSIS — M25562 Pain in left knee: Secondary | ICD-10-CM | POA: Diagnosis not present

## 2017-05-24 DIAGNOSIS — G8929 Other chronic pain: Secondary | ICD-10-CM | POA: Diagnosis not present

## 2017-05-24 MED ORDER — LIDOCAINE HCL 1 % IJ SOLN
2.0000 mL | INTRAMUSCULAR | Status: AC | PRN
  Administered 2017-05-24: 2 mL

## 2017-05-24 MED ORDER — METHYLPREDNISOLONE ACETATE 40 MG/ML IJ SUSP
40.0000 mg | INTRAMUSCULAR | Status: AC | PRN
  Administered 2017-05-24: 40 mg via INTRA_ARTICULAR

## 2017-05-24 MED ORDER — BUPIVACAINE HCL 0.5 % IJ SOLN
2.0000 mL | INTRAMUSCULAR | Status: AC | PRN
  Administered 2017-05-24: 2 mL via INTRA_ARTICULAR

## 2017-05-24 NOTE — Progress Notes (Signed)
Office Visit Note   Patient: Brian [name]           Date of Birth: 05-18-1951           MRN: [id number] Visit Date: 05/24/2017              Requested by: Orpah Melter, MD [address] [place],  [address] PCP: Orpah Melter, MD   Assessment & Plan: Visit Diagnoses:  1. Chronic pain of left knee     Plan: Patient has rheumatoid arthritis with acute exacerbation of his left knee pain.  He also has bone-on-bone changes of his right hip joint.  In terms of his left knee we did perform a cortisone injection.  He has had this in the past and I would like to get an MRI at this point to rule out structural abnormalities and to evaluate for the severity of his degenerative joint disease.  In terms of his right hip I do not think that this is affecting his quality of life significantly enough to warrant a total hip replacement at this point.  I will see him back after the MRI.  Follow-Up Instructions: Return if symptoms worsen or fail to improve.   Orders:  Orders Placed This Encounter  Procedures  . MR Knee Left w/o contrast   No orders of the defined types were placed in this encounter.     Procedures: Large Joint Inj: L knee on 05/24/2017 5:48 PM Details: 22 G needle Medications: 2 mL bupivacaine 0.5 %; 2 mL lidocaine 1 %; 40 mg methylPREDNISolone acetate 40 MG/ML Outcome: tolerated well, no immediate complications Patient was prepped and draped in the usual sterile fashion.       Clinical Data: No additional findings.   Subjective: Chief Complaint  Patient presents with  . Right Hip - Pain  . Left Knee - Pain    Jad comes back today for continued left knee pain and right hip pain.  He is ambulating with a walker.  He has been diagnosed with rheumatoid arthritis.  He is currently seeing a rheumatologist.  He takes prednisone and hydrocodone with did not help significantly.  Denies any constitutional symptoms    Review of Systems    Constitutional: Negative.   All other systems reviewed and are negative.    Objective: Vital Signs: There were no vitals taken for this visit.  Physical Exam  Constitutional: He is oriented to person, place, and time. He appears well-developed and well-nourished.  HENT:  Head: Normocephalic and atraumatic.  Eyes: Pupils are equal, round, and reactive to light.  Neck: Neck supple.  Pulmonary/Chest: Effort normal.  Abdominal: Soft.  Musculoskeletal: Normal range of motion.  Neurological: He is alert and oriented to person, place, and time.  Skin: Skin is warm.  Psychiatric: He has a normal mood and affect. His behavior is normal. Judgment and thought content normal.  Nursing note and vitals reviewed.   Ortho Exam Left knee exam shows no joint effusion.  Collaterals and cruciates are stable..  Collaterals and cruciates are stable. Right hip exam shows painful rotation more so with internal rotation.  Positive Stinchfield sign. Specialty Comments:  No specialty comments available.  Imaging: No results found.   PMFS History: Patient Active Problem List   Diagnosis Date Noted  . Cellulitis 01/22/2017  . Polyarthritis 12/01/2016  . Cellulitis of left leg 11/29/2016  . Sepsis ([place]) 11/29/2016  . Effusion, left knee 11/13/2016  . Persistent atrial fibrillation ([place])   .  Dyspnea   . Positive urine drug screen   . CAP (community acquired pneumonia) 09/30/2016  . Community acquired pneumonia of left lower lobe of lung ([place])   . Pleural effusion   . Tachypnea   . CVA (cerebral infarction) 09/27/2015  . Right hand pain 09/27/2015  . History of gout 09/27/2015  . Cocaine abuse ([place]) 09/27/2015  . TIA (transient ischemic attack) 09/27/2015  . Slurred speech   . Primary osteoarthritis of right hand   . Leukocytosis   . Essential hypertension   . Chronic diastolic CHF (congestive heart failure) ([place])   . HTN (hypertension) 11/22/2013  . Chronic diastolic heart failure ([place])  11/22/2013  . Noncompliance 11/17/2013  . Atrial flutter with rapid ventricular response ([place]) 11/16/2013  . Acute on chronic diastolic CHF (congestive heart failure) ([place]) 09/05/2012  . Tobacco abuse   . Obesity- BMI 35   . Diastolic CHF ([place])   . Atrial flutter ([place])   . Anxiety    Past Medical History:  Diagnosis Date  . Anxiety   . Arthritis   . Atrial flutter ([place])    a. 08/2012  . CHF (congestive heart failure) ([place])   . Gout   . Hypertension   . Obesity   . Pneumonia   . Shortness of breath   . Stroke ([place])   . Tobacco abuse     Family History  Problem Relation Age of Onset  . Cancer Mother   . Heart disease Father   . Heart attack Father   . Cancer Father   . Diabetes Sister     Past Surgical History:  Procedure Laterality Date  . A-FLUTTER ABLATION N/A 12/29/2016   Procedure: A-Flutter Ablation;  Surgeon: [name], MD;  Location: [facility];  Service: Cardiovascular;  Laterality: N/A;  . CARDIOVERSION N/A 11/05/2016   Procedure: CARDIOVERSION;  Surgeon: Sueanne Margarita, MD;  Location: [facility];  Service: Cardiovascular;  Laterality: N/A;  . IR THORACENTESIS ASP PLEURAL SPACE W/IMG GUIDE  10/01/2016  . MICROLARYNGOSCOPY  09/02/2007   with excision of right vocal cord mass, Dr. Constance Holster   Social History   Occupational History  . Not on file  Tobacco Use  . Smoking status: Former Smoker    Packs/day: 1.00    Years: 50.00    Pack years: 50.00    Types: Cigarettes  . Smokeless tobacco: Never Used  Substance and Sexual Activity  . Alcohol use: No    Comment: socially  . Drug use: No  . Sexual activity: Not Currently

## 2017-06-03 ENCOUNTER — Telehealth (INDEPENDENT_AMBULATORY_CARE_PROVIDER_SITE_OTHER): Payer: Self-pay | Admitting: Orthopaedic Surgery

## 2017-06-03 NOTE — Telephone Encounter (Signed)
Just to clarify, DO take him out of work x 3 months or NO?

## 2017-06-03 NOTE — Telephone Encounter (Signed)
Patient's sister called on behalf of patient, he is suppose to work this weekend but while working the other day he almost passed out from the pain and he is requesting a leave of absence note for his employer for the next 3 months. If they could pick this up this afternoon or tomorrow before the weather gets bad. CB # [phone] or [phone] can leave detailed message on both

## 2017-06-03 NOTE — Telephone Encounter (Signed)
From reading your note, it said "I can justify taking him our of work for 3 months..."  So I had written the note, then as I was speaking with his sister, I realized that you probably meant to say "cannot".  I am sorry.  Patient already has the letter I composed.  I have however advised her that this is incorrect.   We discussed that he wants to have the [place] in January, and I told her at that point we can take him out for an extended time for healing/recoup after surgery.  So at this time she is asking if you would cover him to be out until the end of the year to rest, and try to get the knee/hip to calm down some.  Patient does have an appt with you on December 17.  Please advise.

## 2017-06-03 NOTE — Telephone Encounter (Signed)
I can justify taking him out of work for 3 months.  If he feels that his knee is bothering him significantly then he may want to consider applying for disability.

## 2017-06-03 NOTE — Telephone Encounter (Signed)
Please advise 

## 2017-06-03 NOTE — Telephone Encounter (Signed)
That's fine

## 2017-06-03 NOTE — Telephone Encounter (Signed)
no

## 2017-06-04 ENCOUNTER — Encounter (INDEPENDENT_AMBULATORY_CARE_PROVIDER_SITE_OTHER): Payer: Self-pay | Admitting: Radiology

## 2017-06-04 DIAGNOSIS — M2550 Pain in unspecified joint: Secondary | ICD-10-CM | POA: Diagnosis not present

## 2017-06-04 DIAGNOSIS — M0579 Rheumatoid arthritis with rheumatoid factor of multiple sites without organ or systems involvement: Secondary | ICD-10-CM | POA: Diagnosis not present

## 2017-06-04 DIAGNOSIS — D508 Other iron deficiency anemias: Secondary | ICD-10-CM | POA: Diagnosis not present

## 2017-06-04 DIAGNOSIS — E669 Obesity, unspecified: Secondary | ICD-10-CM | POA: Diagnosis not present

## 2017-06-04 DIAGNOSIS — R5382 Chronic fatigue, unspecified: Secondary | ICD-10-CM | POA: Diagnosis not present

## 2017-06-04 DIAGNOSIS — Z6833 Body mass index (BMI) 33.0-33.9, adult: Secondary | ICD-10-CM | POA: Diagnosis not present

## 2017-06-04 DIAGNOSIS — M150 Primary generalized (osteo)arthritis: Secondary | ICD-10-CM | POA: Diagnosis not present

## 2017-06-04 NOTE — Telephone Encounter (Signed)
New letter compiled, sent through [place], sister will get this letter, pt will f/u as scheduled.

## 2017-06-04 NOTE — Telephone Encounter (Signed)
I called, discussed.  

## 2017-06-04 NOTE — Telephone Encounter (Signed)
Patients sister does not need the letter anymore but she would like a call back from you to make sure y'all are on the same page. Please call patient back when possible [phone]

## 2017-06-07 ENCOUNTER — Ambulatory Visit (INDEPENDENT_AMBULATORY_CARE_PROVIDER_SITE_OTHER): Payer: PPO | Admitting: Orthopaedic Surgery

## 2017-06-11 ENCOUNTER — Ambulatory Visit
Admission: RE | Admit: 2017-06-11 | Discharge: 2017-06-11 | Disposition: A | Discharge status: Home / Self Care | Payer: PPO | Source: Ambulatory Visit | Attending: Orthopaedic Surgery | Admitting: Orthopaedic Surgery

## 2017-06-11 ENCOUNTER — Other Ambulatory Visit: Payer: PPO

## 2017-06-11 DIAGNOSIS — G8929 Other chronic pain: Secondary | ICD-10-CM

## 2017-06-11 DIAGNOSIS — M25562 Pain in left knee: Secondary | ICD-10-CM | POA: Diagnosis not present

## 2017-06-14 ENCOUNTER — Ambulatory Visit (INDEPENDENT_AMBULATORY_CARE_PROVIDER_SITE_OTHER): Payer: PPO | Admitting: Orthopaedic Surgery

## 2017-06-14 ENCOUNTER — Encounter (INDEPENDENT_AMBULATORY_CARE_PROVIDER_SITE_OTHER): Payer: Self-pay | Admitting: Orthopaedic Surgery

## 2017-06-14 DIAGNOSIS — M25462 Effusion, left knee: Secondary | ICD-10-CM | POA: Diagnosis not present

## 2017-06-14 DIAGNOSIS — M1712 Unilateral primary osteoarthritis, left knee: Secondary | ICD-10-CM

## 2017-06-14 NOTE — Progress Notes (Signed)
Office Visit Note   Patient: Brian [name]           Date of Birth: 1950/08/22           MRN: [id number] Visit Date: 06/14/2017              Requested by: Orpah Melter, MD [address] [place], [place] [address] PCP: Orpah Melter, MD   Assessment & Plan: Visit Diagnoses:  1. Unilateral primary osteoarthritis, left knee   2. Effusion, left knee     Plan: Impression is end-stage left knee degenerative joint disease that has failed conservative treatment.  Patient has significant difficulty with ADLs and chronic night pain.  He has failed injections also.  Patient wishes to pursue a total knee replacement after a full discussion of risk benefits alternatives to surgery.  We will obtain preoperative cardiac clearance from Dr. Marlou Porch.  Questions encouraged and answered.  Follow-up as needed.  Follow-Up Instructions: Return if symptoms worsen or fail to improve.   Orders:  No orders of the defined types were placed in this encounter.  No orders of the defined types were placed in this encounter.     Procedures: No procedures performed   Clinical Data: No additional findings.   Subjective: Chief Complaint  Patient presents with  . Left Knee - Pain    Patient follows up today to review his MRI of his left knee.  He continues to have significant pain.  He is walking with a walker.  Denies any numbness and tingling.    Review of Systems  Constitutional: Negative.   All other systems reviewed and are negative.    Objective: Vital Signs: There were no vitals taken for this visit.  Physical Exam  Constitutional: He is oriented to person, place, and time. He appears well-developed and well-nourished.  Pulmonary/Chest: Effort normal.  Abdominal: Soft.  Neurological: He is alert and oriented to person, place, and time.  Skin: Skin is warm.  Psychiatric: He has a normal mood and affect. His behavior is normal. Judgment and thought content normal.    Nursing note and vitals reviewed.   Ortho Exam Left knee exam is stable.  Impression is advanced degenerative joint disease Specialty Comments:  No specialty comments available.  Imaging: No results found.   PMFS History: Patient Active Problem List   Diagnosis Date Noted  . Cellulitis 01/22/2017  . Polyarthritis 12/01/2016  . Cellulitis of left leg 11/29/2016  . Sepsis ([place]) 11/29/2016  . Effusion, left knee 11/13/2016  . Persistent atrial fibrillation ([place])   . Dyspnea   . Positive urine drug screen   . CAP (community acquired pneumonia) 09/30/2016  . Community acquired pneumonia of left lower lobe of lung ([place])   . Pleural effusion   . Tachypnea   . CVA (cerebral infarction) 09/27/2015  . Right hand pain 09/27/2015  . History of gout 09/27/2015  . Cocaine abuse ([place]) 09/27/2015  . TIA (transient ischemic attack) 09/27/2015  . Slurred speech   . Primary osteoarthritis of right hand   . Leukocytosis   . Essential hypertension   . Chronic diastolic CHF (congestive heart failure) ([place])   . HTN (hypertension) 11/22/2013  . Chronic diastolic heart failure ([place]) 11/22/2013  . Noncompliance 11/17/2013  . Atrial flutter with rapid ventricular response ([place]) 11/16/2013  . Acute on chronic diastolic CHF (congestive heart failure) ([place]) 09/05/2012  . Tobacco abuse   . Obesity- BMI 35   . Diastolic CHF ([place])   .  Atrial flutter ([place])   . Anxiety    Past Medical History:  Diagnosis Date  . Anxiety   . Arthritis   . Atrial flutter ([place])    a. 08/2012  . CHF (congestive heart failure) ([place])   . Gout   . Hypertension   . Obesity   . Pneumonia   . Shortness of breath   . Stroke ([place])   . Tobacco abuse     Family History  Problem Relation Age of Onset  . Cancer Mother   . Heart disease Father   . Heart attack Father   . Cancer Father   . Diabetes Sister     Past Surgical History:  Procedure Laterality Date  . A-FLUTTER ABLATION N/A 12/29/2016   Procedure:  A-Flutter Ablation;  Surgeon: [name], MD;  Location: [facility];  Service: Cardiovascular;  Laterality: N/A;  . CARDIOVERSION N/A 11/05/2016   Procedure: CARDIOVERSION;  Surgeon: Sueanne Margarita, MD;  Location: [facility];  Service: Cardiovascular;  Laterality: N/A;  . IR THORACENTESIS ASP PLEURAL SPACE W/IMG GUIDE  10/01/2016  . MICROLARYNGOSCOPY  09/02/2007   with excision of right vocal cord mass, Dr. Constance Holster   Social History   Occupational History  . Not on file  Tobacco Use  . Smoking status: Former Smoker    Packs/day: 1.00    Years: 50.00    Pack years: 50.00    Types: Cigarettes  . Smokeless tobacco: Never Used  Substance and Sexual Activity  . Alcohol use: No    Comment: socially  . Drug use: No  . Sexual activity: Not Currently

## 2017-06-15 ENCOUNTER — Telehealth (INDEPENDENT_AMBULATORY_CARE_PROVIDER_SITE_OTHER): Payer: Self-pay | Admitting: Orthopaedic Surgery

## 2017-06-15 NOTE — Telephone Encounter (Signed)
See message below °

## 2017-06-15 NOTE — Telephone Encounter (Signed)
ok 

## 2017-06-15 NOTE — Telephone Encounter (Signed)
Patient's sister ([place]) called asked if Dr Erlinda Hong can write a letter taking patient out of work 06/29/16 through 09/27/16. She advised they do not have a surgery date yet for the patient. The number to contact [place] is [phone]

## 2017-06-16 NOTE — Telephone Encounter (Signed)
IC and LMVM to call me back.  Letter done.

## 2017-06-16 NOTE — Telephone Encounter (Signed)
(  I have taken care of the requested work note.)  Patient is asking about cardiac clearance?  He sees Dr Carolyne Littles, and Dr Rayann Heman.  Are you going to call them to arrange this appt for clearance?  Sister [place] is asking.  Please call her to advise.  Home number in chart or cell [phone].

## 2017-06-18 ENCOUNTER — Telehealth: Payer: Self-pay | Admitting: *Deleted

## 2017-06-18 NOTE — Telephone Encounter (Signed)
   [facility] Pre-operative Risk Assessment    Request for surgical clearance:  1. What type of surgery is being performed? Total Knee Replacement   2. When is this surgery scheduled? Based upon clearance    3. Are there any medications that need to be held prior to surgery and how long? XARELTO   4. Practice name and name of physician performing surgery? [facility]     5. What is your office phone and fax number? Phone [phone] Fax [phone] Attn: Debbie  6. Anesthesia type (None, local, MAC, general) ? General   Charmane Protzman M 06/18/2017, 3:56 PM  _________________________________________________________________   (provider comments below)

## 2017-06-24 ENCOUNTER — Encounter: Payer: Self-pay | Admitting: Student

## 2017-06-24 NOTE — Telephone Encounter (Addendum)
    JAVONNI MACKE has been cleared for surgery as outlined below. Can hold Xarelto 2 days prior to procedure and resume the evening of or day after the procedure at the discretion of the procedure MD. Please resume therapeutic dose (not prophylactic dosing).   I will route this recommendation to the requesting party via Epic fax function and remove from pre-op pool.  Please call with questions.  Erma Heritage, PA-C 06/24/2017, 4:35 PM

## 2017-06-24 NOTE — Telephone Encounter (Signed)
Patient with diagnosis of atrial fibrillation on Xarelto for anticoagulation.    Procedure: total knee replacement Date of procedure: TBD  CHADS2-VASc score of  3 (CHF, HTN, AGE)  Note that chart indicates prior stroke, when researched in chart it appears patient had episode in March 2017 of taking Nyquil and clonazepam at bedtime then got up at 3am to go to work - had slurred speech and roommate sent patient to ER for possible stroke.  CrCl 81.8 Platelet count 375  Per office protocol, patient can hold Xarelto for 2 days prior to procedure.   Patient will not need bridging with Lovenox (enoxaparin) around procedure.  Patient should restart Xarelto on the evening of procedure or day after, at discretion of procedure MD  For orthopedic procedures please be sure to resume therapeutic (not prophylactic) dosing.

## 2017-06-24 NOTE — Telephone Encounter (Signed)
   Primary Cardiologist: Dr. Marlou Porch EP: Dr. Rayann Heman  Chart reviewed as part of pre-operative protocol coverage. Patient was contacted 06/24/2017 in reference to pre-operative risk assessment for pending surgery as outlined below.  Brian [name] was last seen on 03/18/2017 by Roderic Palau, NP. Since that day, Brian [name] has done well and has not experienced any new chest pain or dyspnea on exertion per his POA's report (Sister - [place]). His main complaint is left knee pain. His sister is unaware of any history of CVA's, TIA's, DVT or PE's.    Therefore, based on ACC/AHA guidelines, the patient would be at acceptable risk for the planned procedure without further cardiovascular testing.   I will route this to Pharmacy to address the need for Lovenox bridging in the setting of holding Xarelto for his upcoming surgery.   Please call with questions.  Erma Heritage, PA-C 06/24/2017, 2:28 PM

## 2017-06-26 ENCOUNTER — Other Ambulatory Visit: Payer: Self-pay | Admitting: Internal Medicine

## 2017-07-01 ENCOUNTER — Telehealth (INDEPENDENT_AMBULATORY_CARE_PROVIDER_SITE_OTHER): Payer: Self-pay | Admitting: Orthopaedic Surgery

## 2017-07-01 NOTE — Telephone Encounter (Signed)
Please call pt wife she has some questions pertaining to pt surgery. I did transfer her to sherrie so she could speak with her as well.  #CB [phone]

## 2017-07-01 NOTE — Telephone Encounter (Signed)
See message below °

## 2017-07-07 NOTE — Telephone Encounter (Signed)
Needs to be off humira at least 2 weeks before surgery and 2 weeks after sugery

## 2017-07-07 NOTE — Telephone Encounter (Signed)
Spoke with sister about scheduling.  What is the time frame for not using Humira before surgery?

## 2017-07-12 ENCOUNTER — Telehealth (INDEPENDENT_AMBULATORY_CARE_PROVIDER_SITE_OTHER): Payer: Self-pay | Admitting: Orthopaedic Surgery

## 2017-07-12 NOTE — Telephone Encounter (Signed)
He does not need to stop the methotrexate. But he has to stop the humira at least 2 weeks before surgery and 2 weeks after surgery

## 2017-07-12 NOTE — Telephone Encounter (Signed)
Pt wife called and the hospital recommend pt wife to call Dr.Xu for medication purposes. Pt wife needed to know if he should stop med prior to surgery.  Methotrexate( Rheumatrex) 2.5 mg tablet   Pre-admit 07/22/2017   Pt wife called and left VM [phone]

## 2017-07-12 NOTE — Telephone Encounter (Signed)
See message below °

## 2017-07-14 NOTE — Telephone Encounter (Signed)
Sister called me again [facility], IC her [phone] as requested, and LMVM for her to call and hit option for triage and have them come get me to speak with her.

## 2017-07-14 NOTE — Telephone Encounter (Signed)
I s/w sister and advised.  Last Humira done June 28, 2017.  Leafy Kindle, PA monitors this and is aware of this, and patient is having surgery.

## 2017-07-16 ENCOUNTER — Encounter (HOSPITAL_COMMUNITY)
Admission: RE | Admit: 2017-07-16 | Discharge: 2017-07-16 | Disposition: A | Discharge status: Home / Self Care | Payer: Medicare Other | Source: Ambulatory Visit | Attending: Orthopaedic Surgery | Admitting: Orthopaedic Surgery

## 2017-07-16 ENCOUNTER — Other Ambulatory Visit: Payer: Self-pay

## 2017-07-16 ENCOUNTER — Encounter (HOSPITAL_COMMUNITY): Payer: Self-pay

## 2017-07-16 DIAGNOSIS — I4892 Unspecified atrial flutter: Secondary | ICD-10-CM | POA: Insufficient documentation

## 2017-07-16 DIAGNOSIS — F17210 Nicotine dependence, cigarettes, uncomplicated: Secondary | ICD-10-CM | POA: Insufficient documentation

## 2017-07-16 DIAGNOSIS — Z7901 Long term (current) use of anticoagulants: Secondary | ICD-10-CM | POA: Diagnosis not present

## 2017-07-16 DIAGNOSIS — F419 Anxiety disorder, unspecified: Secondary | ICD-10-CM | POA: Diagnosis not present

## 2017-07-16 DIAGNOSIS — M1712 Unilateral primary osteoarthritis, left knee: Secondary | ICD-10-CM | POA: Diagnosis not present

## 2017-07-16 DIAGNOSIS — I509 Heart failure, unspecified: Secondary | ICD-10-CM | POA: Insufficient documentation

## 2017-07-16 DIAGNOSIS — Z01818 Encounter for other preprocedural examination: Secondary | ICD-10-CM | POA: Insufficient documentation

## 2017-07-16 DIAGNOSIS — Z79899 Other long term (current) drug therapy: Secondary | ICD-10-CM | POA: Insufficient documentation

## 2017-07-16 DIAGNOSIS — I110 Hypertensive heart disease with heart failure: Secondary | ICD-10-CM | POA: Insufficient documentation

## 2017-07-16 HISTORY — DX: Personal history of urinary calculi: Z87.442

## 2017-07-16 HISTORY — DX: Anemia, unspecified: D64.9

## 2017-07-16 LAB — COMPREHENSIVE METABOLIC PANEL
ALT: 29 U/L (ref 17–63)
AST: 27 U/L (ref 15–41)
Albumin: 3.5 g/dL (ref 3.5–5.0)
Alkaline Phosphatase: 81 U/L (ref 38–126)
Anion gap: 12 (ref 5–15)
BUN: 16 mg/dL (ref 6–20)
CHLORIDE: 104 mmol/L (ref 101–111)
CO2: 22 mmol/L (ref 22–32)
CREATININE: 0.84 mg/dL (ref 0.61–1.24)
Calcium: 9.4 mg/dL (ref 8.9–10.3)
GFR calc non Af Amer: >60 mL/min (ref >60)
Glucose, Bld: 161 mg/dL — ABNORMAL HIGH (ref 65–99)
Potassium: 4.2 mmol/L (ref 3.5–5.1)
SODIUM: 138 mmol/L (ref 135–145)
Total Bilirubin: 1 mg/dL (ref 0.3–1.2)
Total Protein: 7.3 g/dL (ref 6.5–8.1)

## 2017-07-16 LAB — CBC WITH DIFFERENTIAL/PLATELET
BASOS ABS: 0.1 10*3/uL (ref 0.0–0.1)
Basophils Relative: 1 %
EOS ABS: 0.2 10*3/uL (ref 0.0–0.7)
EOS PCT: 2 %
HCT: 44.9 % (ref 39.0–52.0)
Hemoglobin: 14 g/dL (ref 13.0–17.0)
Lymphocytes Relative: 14 %
Lymphs Abs: 1.7 10*3/uL (ref 0.7–4.0)
MCH: 28.6 pg (ref 26.0–34.0)
MCHC: 31.2 g/dL (ref 30.0–36.0)
MCV: 91.8 fL (ref 78.0–100.0)
Monocytes Absolute: 1 10*3/uL (ref 0.1–1.0)
Monocytes Relative: 9 %
Neutro Abs: 8.9 10*3/uL — ABNORMAL HIGH (ref 1.7–7.7)
Neutrophils Relative %: 74 %
PLATELETS: 300 10*3/uL (ref 150–400)
RBC: 4.89 MIL/uL (ref 4.22–5.81)
RDW: 16.4 % — ABNORMAL HIGH (ref 11.5–15.5)
WBC: 11.9 10*3/uL — AB (ref 4.0–10.5)

## 2017-07-16 LAB — TYPE AND SCREEN
ABO/RH(D): A POS
ANTIBODY SCREEN: NEGATIVE

## 2017-07-16 LAB — SURGICAL PCR SCREEN
MRSA, PCR: NEGATIVE
Staphylococcus aureus: POSITIVE — AB

## 2017-07-16 LAB — PROTIME-INR
INR: 1.17
Prothrombin Time: 14.8 seconds (ref 11.4–15.2)

## 2017-07-16 LAB — ABO/RH: ABO/RH(D): A POS

## 2017-07-16 LAB — APTT: aPTT: 38 seconds — ABNORMAL HIGH (ref 24–36)

## 2017-07-16 NOTE — Pre-Procedure Instructions (Addendum)
    Brian [name]  07/16/2017     Your procedure is scheduled on Thursday, January 24.  Report to [facility] Admitting at 12:50 PM                        Your surgery or procedure is scheduled for 2:52 PM   Call this number if you have problems the morning of surgery:[phone]  Pre- op desk      For any other questions, please call [phone], Monday - Friday 8 AM - 4 PM.   Remember:  Do not eat food or drink liquids after midnight Wednesday, January 23.  Take these medicines the morning of surgery with A SIP OF WATER :   metoprolol succinate (TOPROL XL)  ranitidine (ZANTAC) Take if needed: HYDROcodone-acetaminophen (NORCO/VICODIN).  Stop Xarelto as directed by Dr Erlinda Hong.  STOP taking Aspirin, Aspirin Products (Goody Powder, Excedrin Migraine), Ibuprofen (Advil), Naproxen (Aleve), Vitamins and Herbal Products (ie Fish Oil).   Do not wear jewelry, make-up or nail polish.  Do not wear lotions, powders, or perfumes, or deodorant.  Do not shave 48 hours prior to surgery.  Men may shave face and neck.  Do not bring valuables to the hospital.  [facility] is not responsible for any belongings or valuables.  Contacts, dentures or bridgework may not be worn into surgery.  Leave your suitcase in the car.  After surgery it may be brought to your room.  For patients admitted to the hospital, discharge time will be determined by your treatment team.  Special instructions:  No Smoking within 24 hours of surgery.   Please read over the following fact sheets that you were given. Pain Booklet, Patient Instructions for Mupirocin Application, Incentive Spirometry, Surgical Site Infections.

## 2017-07-16 NOTE — Progress Notes (Signed)
Mr.  Brian [name] denies chest pain or shortness of breath, dizziness or lightheadedness .  Patient  Stopped Humeria- "2 weeks prior to surgery and Xarelto 2 days prior to surgery."

## 2017-07-19 NOTE — Progress Notes (Addendum)
Anesthesia Chart Review: Patient is a 67 year old male scheduled for left TKA on 07/22/17 by Dr. Frankey Shown. Procedure is posted for [facility].  History includes smoker, afib/aflutter (diagnosed 08/2012; s/p cardioversion 11/05/16 with recurrence by 11/11/16, SVT ablation 12/29/16 with recurrence by 7/65/46), diastolic CHF, exertional dyspnea, RA, HTN, anxiety, nephrolithiasis, anemia, gout, PNA with left pleural effusion s/p thoracentesis 10/01/16, microlaryngoscopy with excision of right vocal cord mass.09/02/07. Admission 08/2015 for transient slurred speech in the setting of pain medication. Evaluated for possible TIA by neurology. Brain MRI was negative for CVA. (Patient tells me he was ultimately told it was a medication reaction and not a "mini stroke") BMI is consistent with obesity.  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 He had been having hip and knee pain. No signs of infection. No LLE DVT. EKG showed NSR. Creatinine was increased. Near syncope felt possibly vasovagal versus multifactorial. He was hydrated. (Follow-up phone calls indicate patient requested letter for work because pain was causing him to almost pass out. Patient says he thinks he was dehydrated. He denied any recurrent episodes. He is currently off work.)  - Admission 01/22/17 - 01/24/17 for sepsis presumed secondary to LLE cellulitis. Treated with antibiotics.  - Admission 11/27/16 - 12/04/16 for LLE cellulitis with sepsis and acute polyarthralgia. Rheumatoid factor positive. Referred to rheumatology.  - PCP is listed as Dr. Orpah Melter. - Rheumatologist is with [facility]. Has been seeing Leafy Kindle, PA-C. - Primary cardiologist is Dr. Candee Furbish, first seen 09/2016 (previously saw Dr. Mare Ferrari, last 2016). She was referred to EP cardiology. EP cardiologist is Dr. [name]. Last visit was on 03/18/17 with Roderic Palau, NP [facility]). Patient was back in afib. He  was also anemic. Patient was to follow-up with Dr. Rayann Heman after anemia addressed/improved to consider repeat cardioversion. (Patient was in [place] during 05/18/17 ED visit.) Per 06/24/17 telephone encounter by Bernerd Pho, PA-C, ".Marland KitchenMarland KitchenTherefore, based on ACC/AHA guidelines, the patient would be at acceptable risk for the planned procedure without further cardiovascular testing."   Meds include Humira, folic acid, Lasix, Norco, methotrexate, Toprol XL, KCl, Zantac, Xarelto. Per PAT notes, Humira held 2 weeks prior to surgery and Xarelto 2 days prior to surgery--However, when I called patient, he reported that he actually had his last Xarelto dose on 07/16/17.  BP 137/78   Pulse 100   Temp 36.7 C (Oral)   Resp 18   Ht 5\' 10"  (1.778 m)   Wt 232 lb 4 oz (105.3 kg)   SpO2 98%   BMI 33.32 kg/m  HR 118 bpm with vitals, 100 bpm with recheck. (Anesthesia APP not notified during PAT visit of elevated HR. EKG was not done at PAT.) I called and spoke with patient. He reported that he can tell when he is in afib, and he has not noted any persistent episodes suggestive of afib. He monitors his HR at home (machine) and reports his HR since 07/16/17 has been in the 70's. He denied any acute cardiopulmonary symptoms.  EKG 05/18/17: NSR.  Echo 09/28/15: Study Conclusions - Left ventricle: The cavity size was normal. Systolic function was   normal. The estimated ejection fraction was in the range of 55%   to 60%. Wall motion was normal; there were no regional wall   motion abnormalities. Left ventricular diastolic function   parameters were normal. - Aortic valve: There was trivial regurgitation. Valve area (VTI):   1.79 cm^2. Valve area (Vmax):  2.01 cm^2. Valve area (Vmean): 2   cm^2. - Atrial septum: There was increased thickness of the septum,   consistent with lipomatous hypertrophy.  CTA chest 09/30/16: IMPRESSION: 1. No acute pulmonary embolus. 2. Small circumferential pericardial effusion measuring up  to 1.8 cm thickness. 3. Moderate left effusion with adjacent compressive atelectasis. 4. Degenerative changes along the dorsal spine. Healed fracture deformity of the upper sternum.  CXR 05/18/17: IMPRESSION: No acute cardiopulmonary findings.  Preoperative labs noted. Cr 0.84. Glucose 161. PT/INR 14.8/1.17. PTT 38. WBC 11.9. H/H 14.0/44.9. PLT 300. T&S done. A1c 6.1 on 09/30/16.  Patient has cardiology clearance. As of 03/18/17, EP notes indicate there was consideration of repeat cardioversion after anemia addressed (H/H now normal), but he was back in SR at 05/18/17 ED visit. Patient was mildly tachycardic at PAT, but without exam or EKG cannot be certain whether or not he was in SR or back in afib. As above, he reports he can tell when he is in afib and has not had symptoms suggestive of recurrent afib/flutter.  His anticoagulation is temporarily on hold for surgery. Reviewed above with anesthesiologist Dr. Myrtie Soman. Patient will be evaluated on the day and assessed for any acute changes prior to proceeding.  George Hugh [facility] Stay Center/Anesthesiology Phone [phone] 07/20/2017 2:24 PM

## 2017-07-21 MED ORDER — TRANEXAMIC ACID 1000 MG/10ML IV SOLN
1000.0000 mg | INTRAVENOUS | Status: AC
  Administered 2017-07-22: 1000 mg via INTRAVENOUS
  Filled 2017-07-21: qty 1100

## 2017-07-22 ENCOUNTER — Other Ambulatory Visit: Payer: Self-pay

## 2017-07-22 ENCOUNTER — Inpatient Hospital Stay (HOSPITAL_COMMUNITY): Payer: Medicare Other

## 2017-07-22 ENCOUNTER — Encounter (HOSPITAL_COMMUNITY): Payer: Self-pay | Admitting: *Deleted

## 2017-07-22 ENCOUNTER — Ambulatory Visit (HOSPITAL_COMMUNITY): Payer: Medicare Other | Admitting: Vascular Surgery

## 2017-07-22 ENCOUNTER — Inpatient Hospital Stay (HOSPITAL_COMMUNITY)
Admission: RE | Admit: 2017-07-22 | Discharge: 2017-07-25 | DRG: 470 | Disposition: A | Discharge status: Skilled Nursing Facility | Payer: Medicare Other | Source: Ambulatory Visit | Attending: Orthopaedic Surgery | Admitting: Orthopaedic Surgery

## 2017-07-22 ENCOUNTER — Ambulatory Visit (HOSPITAL_COMMUNITY): Payer: Medicare Other | Admitting: Certified Registered Nurse Anesthetist

## 2017-07-22 ENCOUNTER — Ambulatory Visit (HOSPITAL_COMMUNITY): Payer: Medicare Other

## 2017-07-22 ENCOUNTER — Encounter (HOSPITAL_COMMUNITY)
Admission: RE | Disposition: A | Discharge status: Skilled Nursing Facility | Payer: Self-pay | Source: Ambulatory Visit | Attending: Orthopaedic Surgery

## 2017-07-22 DIAGNOSIS — F419 Anxiety disorder, unspecified: Secondary | ICD-10-CM | POA: Diagnosis not present

## 2017-07-22 DIAGNOSIS — I509 Heart failure, unspecified: Secondary | ICD-10-CM | POA: Diagnosis present

## 2017-07-22 DIAGNOSIS — D649 Anemia, unspecified: Secondary | ICD-10-CM | POA: Diagnosis not present

## 2017-07-22 DIAGNOSIS — Z23 Encounter for immunization: Secondary | ICD-10-CM | POA: Diagnosis not present

## 2017-07-22 DIAGNOSIS — G8918 Other acute postprocedural pain: Secondary | ICD-10-CM | POA: Diagnosis not present

## 2017-07-22 DIAGNOSIS — Z96652 Presence of left artificial knee joint: Secondary | ICD-10-CM | POA: Diagnosis not present

## 2017-07-22 DIAGNOSIS — Z471 Aftercare following joint replacement surgery: Secondary | ICD-10-CM | POA: Diagnosis not present

## 2017-07-22 DIAGNOSIS — I110 Hypertensive heart disease with heart failure: Secondary | ICD-10-CM | POA: Diagnosis present

## 2017-07-22 DIAGNOSIS — M0689 Other specified rheumatoid arthritis, multiple sites: Secondary | ICD-10-CM | POA: Diagnosis present

## 2017-07-22 DIAGNOSIS — G8911 Acute pain due to trauma: Secondary | ICD-10-CM | POA: Diagnosis not present

## 2017-07-22 DIAGNOSIS — Z6833 Body mass index (BMI) 33.0-33.9, adult: Secondary | ICD-10-CM

## 2017-07-22 DIAGNOSIS — I6930 Unspecified sequelae of cerebral infarction: Secondary | ICD-10-CM | POA: Diagnosis not present

## 2017-07-22 DIAGNOSIS — G459 Transient cerebral ischemic attack, unspecified: Secondary | ICD-10-CM | POA: Diagnosis not present

## 2017-07-22 DIAGNOSIS — I10 Essential (primary) hypertension: Secondary | ICD-10-CM | POA: Diagnosis not present

## 2017-07-22 DIAGNOSIS — F17210 Nicotine dependence, cigarettes, uncomplicated: Secondary | ICD-10-CM | POA: Diagnosis present

## 2017-07-22 DIAGNOSIS — Z96659 Presence of unspecified artificial knee joint: Secondary | ICD-10-CM

## 2017-07-22 DIAGNOSIS — Z8249 Family history of ischemic heart disease and other diseases of the circulatory system: Secondary | ICD-10-CM

## 2017-07-22 DIAGNOSIS — Z79899 Other long term (current) drug therapy: Secondary | ICD-10-CM

## 2017-07-22 DIAGNOSIS — Z91048 Other nonmedicinal substance allergy status: Secondary | ICD-10-CM | POA: Diagnosis not present

## 2017-07-22 DIAGNOSIS — M25762 Osteophyte, left knee: Secondary | ICD-10-CM | POA: Diagnosis present

## 2017-07-22 DIAGNOSIS — D62 Acute posthemorrhagic anemia: Secondary | ICD-10-CM | POA: Diagnosis not present

## 2017-07-22 DIAGNOSIS — Z7901 Long term (current) use of anticoagulants: Secondary | ICD-10-CM | POA: Diagnosis not present

## 2017-07-22 DIAGNOSIS — M1712 Unilateral primary osteoarthritis, left knee: Secondary | ICD-10-CM | POA: Diagnosis not present

## 2017-07-22 DIAGNOSIS — M6281 Muscle weakness (generalized): Secondary | ICD-10-CM | POA: Diagnosis not present

## 2017-07-22 DIAGNOSIS — M069 Rheumatoid arthritis, unspecified: Secondary | ICD-10-CM | POA: Diagnosis not present

## 2017-07-22 DIAGNOSIS — M109 Gout, unspecified: Secondary | ICD-10-CM | POA: Diagnosis not present

## 2017-07-22 DIAGNOSIS — S8990XA Unspecified injury of unspecified lower leg, initial encounter: Secondary | ICD-10-CM | POA: Diagnosis not present

## 2017-07-22 DIAGNOSIS — E669 Obesity, unspecified: Secondary | ICD-10-CM | POA: Diagnosis present

## 2017-07-22 DIAGNOSIS — I5032 Chronic diastolic (congestive) heart failure: Secondary | ICD-10-CM | POA: Diagnosis not present

## 2017-07-22 DIAGNOSIS — I4892 Unspecified atrial flutter: Secondary | ICD-10-CM | POA: Diagnosis not present

## 2017-07-22 HISTORY — PX: TOTAL KNEE ARTHROPLASTY: SHX125

## 2017-07-22 HISTORY — DX: Rheumatoid arthritis, unspecified: M06.9

## 2017-07-22 HISTORY — DX: Personal history of other diseases of the musculoskeletal system and connective tissue: Z87.39

## 2017-07-22 SURGERY — ARTHROPLASTY, KNEE, TOTAL
Anesthesia: Spinal | Site: Knee | Laterality: Left

## 2017-07-22 MED ORDER — FENTANYL CITRATE (PF) 100 MCG/2ML IJ SOLN
INTRAMUSCULAR | Status: AC
  Administered 2017-07-22: 50 ug via INTRAVENOUS
  Filled 2017-07-22: qty 2

## 2017-07-22 MED ORDER — SORBITOL 70 % SOLN: 30.0000 mL | Freq: Every day | Status: DC | PRN

## 2017-07-22 MED ORDER — ALUM & MAG HYDROXIDE-SIMETH 200-200-20 MG/5ML PO SUSP
30.0000 mL | ORAL | Status: DC | PRN
  Administered 2017-07-24: 30 mL via ORAL
  Filled 2017-07-22: qty 30

## 2017-07-22 MED ORDER — SODIUM CHLORIDE 0.9 % IV SOLN
INTRAVENOUS | Status: DC
  Administered 2017-07-22 – 2017-07-23 (×2): via INTRAVENOUS

## 2017-07-22 MED ORDER — PHENYLEPHRINE HCL 10 MG/ML IJ SOLN
INTRAVENOUS | Status: DC | PRN
  Administered 2017-07-22: 40 ug/min via INTRAVENOUS

## 2017-07-22 MED ORDER — FOLIC ACID 1 MG PO TABS
1.0000 mg | ORAL_TABLET | Freq: Every day | ORAL | Status: DC
  Administered 2017-07-22 – 2017-07-25 (×4): 1 mg via ORAL
  Filled 2017-07-22 (×4): qty 1

## 2017-07-22 MED ORDER — LACTATED RINGERS IV SOLN: INTRAVENOUS | Status: DC

## 2017-07-22 MED ORDER — DEXAMETHASONE SODIUM PHOSPHATE 10 MG/ML IJ SOLN
INTRAMUSCULAR | Status: DC | PRN
  Administered 2017-07-22: 10 mg via INTRAVENOUS

## 2017-07-22 MED ORDER — FENTANYL CITRATE (PF) 250 MCG/5ML IJ SOLN
INTRAMUSCULAR | Status: AC
  Filled 2017-07-22: qty 5

## 2017-07-22 MED ORDER — SENNOSIDES-DOCUSATE SODIUM 8.6-50 MG PO TABS: 1.0000 | ORAL_TABLET | Freq: Every evening | ORAL | 1 refills | Status: DC | PRN

## 2017-07-22 MED ORDER — RIVAROXABAN 20 MG PO TABS
20.0000 mg | ORAL_TABLET | Freq: Every day | ORAL | Status: DC
  Administered 2017-07-23 – 2017-07-24 (×2): 20 mg via ORAL
  Filled 2017-07-22 (×2): qty 1

## 2017-07-22 MED ORDER — MIDAZOLAM HCL 2 MG/2ML IJ SOLN
INTRAMUSCULAR | Status: AC
  Administered 2017-07-22: 2 mg via INTRAVENOUS
  Filled 2017-07-22: qty 2

## 2017-07-22 MED ORDER — MENTHOL 3 MG MT LOZG: 1.0000 | LOZENGE | OROMUCOSAL | Status: DC | PRN

## 2017-07-22 MED ORDER — VANCOMYCIN HCL 1000 MG IV SOLR
INTRAVENOUS | Status: DC | PRN
  Administered 2017-07-22: 1000 mg

## 2017-07-22 MED ORDER — CEPHALEXIN 500 MG PO CAPS
500.0000 mg | ORAL_CAPSULE | Freq: Four times a day (QID) | ORAL | Status: DC
  Administered 2017-07-23 – 2017-07-25 (×11): 500 mg via ORAL
  Filled 2017-07-22 (×10): qty 1

## 2017-07-22 MED ORDER — 0.9 % SODIUM CHLORIDE (POUR BTL) OPTIME
TOPICAL | Status: DC | PRN
  Administered 2017-07-22: 1000 mL

## 2017-07-22 MED ORDER — PNEUMOCOCCAL VAC POLYVALENT 25 MCG/0.5ML IJ INJ
0.5000 mL | INJECTION | INTRAMUSCULAR | Status: AC
  Administered 2017-07-23 – 2017-07-24 (×2): 0.5 mL via INTRAMUSCULAR
  Filled 2017-07-22: qty 0.5

## 2017-07-22 MED ORDER — METHOCARBAMOL 1000 MG/10ML IJ SOLN
500.0000 mg | Freq: Four times a day (QID) | INTRAVENOUS | Status: DC | PRN
  Filled 2017-07-22: qty 5

## 2017-07-22 MED ORDER — VANCOMYCIN HCL 1000 MG IV SOLR
INTRAVENOUS | Status: AC
  Filled 2017-07-22: qty 1000

## 2017-07-22 MED ORDER — GLYCOPYRROLATE 0.2 MG/ML IJ SOLN
INTRAMUSCULAR | Status: DC | PRN
  Administered 2017-07-22: 0.2 mg via INTRAVENOUS

## 2017-07-22 MED ORDER — OXYCODONE HCL 5 MG PO TABS: 5.0000 mg | ORAL_TABLET | ORAL | Status: DC | PRN

## 2017-07-22 MED ORDER — ONDANSETRON HCL 4 MG/2ML IJ SOLN
INTRAMUSCULAR | Status: AC
  Filled 2017-07-22: qty 2

## 2017-07-22 MED ORDER — MAGNESIUM CITRATE PO SOLN: 1.0000 | Freq: Once | ORAL | Status: DC | PRN

## 2017-07-22 MED ORDER — ONDANSETRON HCL 4 MG/2ML IJ SOLN
INTRAMUSCULAR | Status: DC | PRN
  Administered 2017-07-22: 4 mg via INTRAVENOUS

## 2017-07-22 MED ORDER — TRANEXAMIC ACID 1000 MG/10ML IV SOLN
2000.0000 mg | INTRAVENOUS | Status: AC
  Administered 2017-07-22: 2000 mg via TOPICAL
  Filled 2017-07-22: qty 20

## 2017-07-22 MED ORDER — LIDOCAINE 2% (20 MG/ML) 5 ML SYRINGE
INTRAMUSCULAR | Status: AC
  Filled 2017-07-22: qty 5

## 2017-07-22 MED ORDER — ACETAMINOPHEN 500 MG PO TABS
1000.0000 mg | ORAL_TABLET | Freq: Four times a day (QID) | ORAL | Status: AC
  Administered 2017-07-22 – 2017-07-23 (×4): 1000 mg via ORAL
  Filled 2017-07-22 (×4): qty 2

## 2017-07-22 MED ORDER — ONDANSETRON HCL 4 MG PO TABS: 4.0000 mg | ORAL_TABLET | Freq: Four times a day (QID) | ORAL | Status: DC | PRN

## 2017-07-22 MED ORDER — METOPROLOL SUCCINATE ER 25 MG PO TB24
ORAL_TABLET | ORAL | Status: AC
  Administered 2017-07-23: 25 mg
  Filled 2017-07-22: qty 1

## 2017-07-22 MED ORDER — MORPHINE SULFATE (PF) 2 MG/ML IV SOLN
1.0000 mg | INTRAVENOUS | Status: DC | PRN
  Administered 2017-07-22 – 2017-07-24 (×2): 1 mg via INTRAVENOUS
  Filled 2017-07-22 (×2): qty 1

## 2017-07-22 MED ORDER — METOPROLOL SUCCINATE 12.5 MG HALF TABLET
25.0000 mg | ORAL_TABLET | Freq: Once | ORAL | Status: AC
  Administered 2017-07-22: 25 mg via ORAL

## 2017-07-22 MED ORDER — FAMOTIDINE 20 MG PO TABS
20.0000 mg | ORAL_TABLET | Freq: Every day | ORAL | Status: DC
  Administered 2017-07-22 – 2017-07-24 (×3): 20 mg via ORAL
  Filled 2017-07-22 (×3): qty 1

## 2017-07-22 MED ORDER — POTASSIUM CHLORIDE CRYS ER 20 MEQ PO TBCR
20.0000 meq | EXTENDED_RELEASE_TABLET | Freq: Every day | ORAL | Status: DC
  Administered 2017-07-22 – 2017-07-25 (×4): 20 meq via ORAL
  Filled 2017-07-22 (×4): qty 1

## 2017-07-22 MED ORDER — METHOCARBAMOL 750 MG PO TABS: 750.0000 mg | ORAL_TABLET | Freq: Two times a day (BID) | ORAL | 0 refills | Status: DC | PRN

## 2017-07-22 MED ORDER — OXYCODONE HCL ER 15 MG PO T12A
15.0000 mg | EXTENDED_RELEASE_TABLET | Freq: Two times a day (BID) | ORAL | Status: DC
  Administered 2017-07-22 – 2017-07-25 (×6): 15 mg via ORAL
  Filled 2017-07-22 (×6): qty 1

## 2017-07-22 MED ORDER — PHENOL 1.4 % MT LIQD: 1.0000 | OROMUCOSAL | Status: DC | PRN

## 2017-07-22 MED ORDER — METOCLOPRAMIDE HCL 5 MG/ML IJ SOLN: 5.0000 mg | Freq: Three times a day (TID) | INTRAMUSCULAR | Status: DC | PRN

## 2017-07-22 MED ORDER — DEXAMETHASONE SODIUM PHOSPHATE 10 MG/ML IJ SOLN
INTRAMUSCULAR | Status: AC
  Filled 2017-07-22: qty 1

## 2017-07-22 MED ORDER — ONDANSETRON HCL 4 MG PO TABS: 4.0000 mg | ORAL_TABLET | Freq: Three times a day (TID) | ORAL | 0 refills | Status: DC | PRN

## 2017-07-22 MED ORDER — CHLORHEXIDINE GLUCONATE 4 % EX LIQD: 60.0000 mL | Freq: Once | CUTANEOUS | Status: DC

## 2017-07-22 MED ORDER — TRANEXAMIC ACID 1000 MG/10ML IV SOLN
1000.0000 mg | Freq: Once | INTRAVENOUS | Status: AC
  Administered 2017-07-22: 1000 mg via INTRAVENOUS
  Filled 2017-07-22: qty 10

## 2017-07-22 MED ORDER — POLYETHYLENE GLYCOL 3350 17 G PO PACK: 17.0000 g | PACK | Freq: Every day | ORAL | Status: DC | PRN

## 2017-07-22 MED ORDER — KETOROLAC TROMETHAMINE 15 MG/ML IJ SOLN
30.0000 mg | Freq: Four times a day (QID) | INTRAMUSCULAR | Status: DC
  Administered 2017-07-22: 30 mg via INTRAVENOUS
  Filled 2017-07-22: qty 2

## 2017-07-22 MED ORDER — ONDANSETRON HCL 4 MG/2ML IJ SOLN: 4.0000 mg | Freq: Four times a day (QID) | INTRAMUSCULAR | Status: DC | PRN

## 2017-07-22 MED ORDER — METHOTREXATE 2.5 MG PO TABS: 20.0000 mg | ORAL_TABLET | ORAL | Status: DC

## 2017-07-22 MED ORDER — INFLUENZA VAC SPLIT HIGH-DOSE 0.5 ML IM SUSY
0.5000 mL | PREFILLED_SYRINGE | INTRAMUSCULAR | Status: AC
  Administered 2017-07-23 – 2017-07-24 (×2): 0.5 mL via INTRAMUSCULAR
  Filled 2017-07-22: qty 0.5

## 2017-07-22 MED ORDER — PROMETHAZINE HCL 25 MG PO TABS: 25.0000 mg | ORAL_TABLET | Freq: Four times a day (QID) | ORAL | 1 refills | Status: DC | PRN

## 2017-07-22 MED ORDER — METOPROLOL SUCCINATE ER 25 MG PO TB24
25.0000 mg | ORAL_TABLET | Freq: Two times a day (BID) | ORAL | Status: DC
  Administered 2017-07-23 – 2017-07-25 (×4): 25 mg via ORAL
  Filled 2017-07-22 (×7): qty 1

## 2017-07-22 MED ORDER — METHOCARBAMOL 500 MG PO TABS
ORAL_TABLET | ORAL | Status: AC
  Filled 2017-07-22: qty 1

## 2017-07-22 MED ORDER — BUPIVACAINE LIPOSOME 1.3 % IJ SUSP
20.0000 mL | INTRAMUSCULAR | Status: AC
  Administered 2017-07-22: 20 mL
  Filled 2017-07-22: qty 20

## 2017-07-22 MED ORDER — PROPOFOL 10 MG/ML IV BOLUS
INTRAVENOUS | Status: DC | PRN
  Administered 2017-07-22: 30 mg via INTRAVENOUS
  Administered 2017-07-22: 20 mg via INTRAVENOUS
  Administered 2017-07-22: 30 mg via INTRAVENOUS
  Administered 2017-07-22: 20 mg via INTRAVENOUS
  Administered 2017-07-22: 30 mg via INTRAVENOUS

## 2017-07-22 MED ORDER — SULFAMETHOXAZOLE-TRIMETHOPRIM 800-160 MG PO TABS: 1.0000 | ORAL_TABLET | Freq: Two times a day (BID) | ORAL | Status: DC

## 2017-07-22 MED ORDER — SULFAMETHOXAZOLE-TRIMETHOPRIM 800-160 MG PO TABS: 1.0000 | ORAL_TABLET | Freq: Two times a day (BID) | ORAL | 0 refills | Status: DC

## 2017-07-22 MED ORDER — KETOROLAC TROMETHAMINE 15 MG/ML IJ SOLN
15.0000 mg | Freq: Four times a day (QID) | INTRAMUSCULAR | Status: AC
  Administered 2017-07-22 – 2017-07-23 (×3): 15 mg via INTRAVENOUS
  Filled 2017-07-22 (×3): qty 1

## 2017-07-22 MED ORDER — ROPIVACAINE HCL 7.5 MG/ML IJ SOLN
INTRAMUSCULAR | Status: DC | PRN
  Administered 2017-07-22: 20 mL via PERINEURAL

## 2017-07-22 MED ORDER — ACETAMINOPHEN 325 MG PO TABS
650.0000 mg | ORAL_TABLET | ORAL | Status: DC | PRN
Start: 2017-07-22 — End: 2017-07-25
  Administered 2017-07-24: 650 mg via ORAL
  Filled 2017-07-22: qty 2

## 2017-07-22 MED ORDER — LACTATED RINGERS IV SOLN
INTRAVENOUS | Status: DC
  Administered 2017-07-22 (×2): via INTRAVENOUS

## 2017-07-22 MED ORDER — CEFAZOLIN SODIUM-DEXTROSE 2-4 GM/100ML-% IV SOLN
INTRAVENOUS | Status: AC
  Filled 2017-07-22: qty 100

## 2017-07-22 MED ORDER — ACETAMINOPHEN 650 MG RE SUPP: 650.0000 mg | RECTAL | Status: DC | PRN

## 2017-07-22 MED ORDER — MIDAZOLAM HCL 2 MG/2ML IJ SOLN
2.0000 mg | Freq: Once | INTRAMUSCULAR | Status: AC
  Administered 2017-07-22: 2 mg via INTRAVENOUS

## 2017-07-22 MED ORDER — FENTANYL CITRATE (PF) 100 MCG/2ML IJ SOLN
50.0000 ug | Freq: Once | INTRAMUSCULAR | Status: AC
  Administered 2017-07-22: 50 ug via INTRAVENOUS

## 2017-07-22 MED ORDER — PROPOFOL 1000 MG/100ML IV EMUL
INTRAVENOUS | Status: AC
  Filled 2017-07-22: qty 100

## 2017-07-22 MED ORDER — SODIUM CHLORIDE 0.9% FLUSH
INTRAVENOUS | Status: DC | PRN
  Administered 2017-07-22: 40 mL

## 2017-07-22 MED ORDER — PROPOFOL 500 MG/50ML IV EMUL
INTRAVENOUS | Status: DC | PRN
  Administered 2017-07-22: 16:00:00 via INTRAVENOUS
  Administered 2017-07-22: 100 ug/kg/min via INTRAVENOUS

## 2017-07-22 MED ORDER — CEFAZOLIN SODIUM-DEXTROSE 2-4 GM/100ML-% IV SOLN
2.0000 g | INTRAVENOUS | Status: AC
  Administered 2017-07-22: 2 g via INTRAVENOUS

## 2017-07-22 MED ORDER — METHOCARBAMOL 500 MG PO TABS
500.0000 mg | ORAL_TABLET | Freq: Four times a day (QID) | ORAL | Status: DC | PRN
  Administered 2017-07-22 – 2017-07-25 (×5): 500 mg via ORAL
  Filled 2017-07-22 (×4): qty 1

## 2017-07-22 MED ORDER — SODIUM CHLORIDE 0.9 % IR SOLN
Status: DC | PRN
  Administered 2017-07-22: 3000 mL

## 2017-07-22 MED ORDER — METOCLOPRAMIDE HCL 5 MG PO TABS: 5.0000 mg | ORAL_TABLET | Freq: Three times a day (TID) | ORAL | Status: DC | PRN

## 2017-07-22 MED ORDER — DEXAMETHASONE SODIUM PHOSPHATE 10 MG/ML IJ SOLN
10.0000 mg | Freq: Once | INTRAMUSCULAR | Status: AC
  Administered 2017-07-23: 10 mg via INTRAVENOUS
  Filled 2017-07-22: qty 1

## 2017-07-22 MED ORDER — PROPOFOL 10 MG/ML IV BOLUS
INTRAVENOUS | Status: AC
  Filled 2017-07-22: qty 20

## 2017-07-22 MED ORDER — CEFAZOLIN SODIUM-DEXTROSE 2-4 GM/100ML-% IV SOLN
2.0000 g | Freq: Four times a day (QID) | INTRAVENOUS | Status: DC
  Administered 2017-07-22 – 2017-07-23 (×2): 2 g via INTRAVENOUS
  Filled 2017-07-22 (×3): qty 100

## 2017-07-22 MED ORDER — DIPHENHYDRAMINE HCL 12.5 MG/5ML PO ELIX: 25.0000 mg | ORAL_SOLUTION | ORAL | Status: DC | PRN

## 2017-07-22 MED ORDER — OXYCODONE HCL 5 MG PO TABS: 5.0000 mg | ORAL_TABLET | ORAL | 0 refills | Status: DC | PRN

## 2017-07-22 MED ORDER — DOCUSATE SODIUM 100 MG PO CAPS
200.0000 mg | ORAL_CAPSULE | Freq: Two times a day (BID) | ORAL | Status: DC
  Administered 2017-07-22 – 2017-07-25 (×6): 200 mg via ORAL
  Filled 2017-07-22 (×7): qty 2

## 2017-07-22 MED ORDER — OXYCODONE HCL 5 MG PO TABS
10.0000 mg | ORAL_TABLET | ORAL | Status: DC | PRN
  Administered 2017-07-22 – 2017-07-25 (×13): 10 mg via ORAL
  Filled 2017-07-22 (×13): qty 2

## 2017-07-22 MED ORDER — OXYCODONE HCL ER 10 MG PO T12A: 10.0000 mg | EXTENDED_RELEASE_TABLET | Freq: Two times a day (BID) | ORAL | 0 refills | Status: DC

## 2017-07-22 SURGICAL SUPPLY — 71 items
ALCOHOL ISOPROPYL (RUBBING) (MISCELLANEOUS) ×3 IMPLANT
BAG DECANTER FOR FLEXI CONT (MISCELLANEOUS) ×3 IMPLANT
BANDAGE ACE 6X5 VEL STRL LF (GAUZE/BANDAGES/DRESSINGS) ×6 IMPLANT
BANDAGE ESMARK 6X9 LF (GAUZE/BANDAGES/DRESSINGS) ×1 IMPLANT
BENZOIN TINCTURE PRP APPL 2/3 (GAUZE/BANDAGES/DRESSINGS) ×3 IMPLANT
BLADE SAW SGTL 13.0X1.19X90.0M (BLADE) ×3 IMPLANT
BNDG COHESIVE 4X5 TAN STRL (GAUZE/BANDAGES/DRESSINGS) ×3 IMPLANT
BNDG ELASTIC 6X10 VLCR STRL LF (GAUZE/BANDAGES/DRESSINGS) ×3 IMPLANT
BNDG ESMARK 6X9 LF (GAUZE/BANDAGES/DRESSINGS) ×3
BOWL SMART MIX CTS (DISPOSABLE) ×3 IMPLANT
CAPTKNEE LGN GII PS VERILNPTIB (Capitated) ×3 IMPLANT
CEMENT BONE REFOBACIN R1X40 US (Cement) ×6 IMPLANT
CLOSURE STERI-STRIP 1/2X4 (GAUZE/BANDAGES/DRESSINGS) ×1
CLSR STERI-STRIP ANTIMIC 1/2X4 (GAUZE/BANDAGES/DRESSINGS) ×2 IMPLANT
COVER SURGICAL LIGHT HANDLE (MISCELLANEOUS) ×3 IMPLANT
CUFF TOURNIQUET SINGLE 34IN LL (TOURNIQUET CUFF) ×3 IMPLANT
CUFF TOURNIQUET SINGLE 44IN (TOURNIQUET CUFF) IMPLANT
DRAPE EXTREMITY T 121X128X90 (DRAPE) ×3 IMPLANT
DRAPE HALF SHEET 40X57 (DRAPES) ×3 IMPLANT
DRAPE INCISE IOBAN 66X45 STRL (DRAPES) IMPLANT
DRAPE ORTHO SPLIT 77X108 STRL (DRAPES) ×4
DRAPE POUCH INSTRU U-SHP 10X18 (DRAPES) ×3 IMPLANT
DRAPE SURG 17X11 SM STRL (DRAPES) ×6 IMPLANT
DRAPE SURG ORHT 6 SPLT 77X108 (DRAPES) ×2 IMPLANT
DRSG AQUACEL AG ADV 3.5X14 (GAUZE/BANDAGES/DRESSINGS) IMPLANT
DURAPREP 26ML APPLICATOR (WOUND CARE) ×6 IMPLANT
ELECT CAUTERY BLADE 6.4 (BLADE) ×3 IMPLANT
ELECT REM PT RETURN 9FT ADLT (ELECTROSURGICAL) ×3
ELECTRODE REM PT RTRN 9FT ADLT (ELECTROSURGICAL) ×1 IMPLANT
GAUZE SPONGE 4X4 12PLY STRL LF (GAUZE/BANDAGES/DRESSINGS) ×3 IMPLANT
GAUZE XEROFORM 5X9 LF (GAUZE/BANDAGES/DRESSINGS) ×3 IMPLANT
GLOVE SKINSENSE NS SZ7.5 (GLOVE) ×2
GLOVE SKINSENSE STRL SZ7.5 (GLOVE) ×1 IMPLANT
GLOVE SURG SYN 7.5  E (GLOVE) ×8
GLOVE SURG SYN 7.5 E (GLOVE) ×4 IMPLANT
GOWN STRL REIN XL XLG (GOWN DISPOSABLE) ×3 IMPLANT
GOWN STRL REUS W/ TWL LRG LVL3 (GOWN DISPOSABLE) ×1 IMPLANT
GOWN STRL REUS W/TWL LRG LVL3 (GOWN DISPOSABLE) ×2
HANDPIECE INTERPULSE COAX TIP (DISPOSABLE) ×2
HOOD PEEL AWAY FLYTE STAYCOOL (MISCELLANEOUS) ×6 IMPLANT
IMMOBILIZER KNEE 24 THIGH 36 (MISCELLANEOUS) ×1 IMPLANT
IMMOBILIZER KNEE 24 UNIV (MISCELLANEOUS) ×3
KIT BASIN OR (CUSTOM PROCEDURE TRAY) ×3 IMPLANT
KIT ROOM TURNOVER OR (KITS) ×3 IMPLANT
MANIFOLD NEPTUNE II (INSTRUMENTS) ×3 IMPLANT
MARKER SKIN DUAL TIP RULER LAB (MISCELLANEOUS) ×3 IMPLANT
NEEDLE SPNL 18GX3.5 QUINCKE PK (NEEDLE) ×3 IMPLANT
NS IRRIG 1000ML POUR BTL (IV SOLUTION) ×3 IMPLANT
PACK TOTAL JOINT (CUSTOM PROCEDURE TRAY) ×3 IMPLANT
PAD ABD 8X10 STRL (GAUZE/BANDAGES/DRESSINGS) ×3 IMPLANT
PAD ARMBOARD 7.5X6 YLW CONV (MISCELLANEOUS) ×6 IMPLANT
PAD CAST 4YDX4 CTTN HI CHSV (CAST SUPPLIES) ×1 IMPLANT
PADDING CAST COTTON 4X4 STRL (CAST SUPPLIES) ×2
SAW OSC TIP CART 19.5X105X1.3 (SAW) ×3 IMPLANT
SET HNDPC FAN SPRY TIP SCT (DISPOSABLE) ×1 IMPLANT
STAPLER VISISTAT 35W (STAPLE) ×3 IMPLANT
SUCTION FRAZIER HANDLE 10FR (MISCELLANEOUS) ×2
SUCTION TUBE FRAZIER 10FR DISP (MISCELLANEOUS) ×1 IMPLANT
SUT ETHILON 2 0 FS 18 (SUTURE) IMPLANT
SUT MNCRL AB 4-0 PS2 18 (SUTURE) IMPLANT
SUT VIC AB 0 CT1 27 (SUTURE) ×4
SUT VIC AB 0 CT1 27XBRD ANBCTR (SUTURE) ×2 IMPLANT
SUT VIC AB 1 CTX 27 (SUTURE) ×9 IMPLANT
SUT VIC AB 2-0 CT1 27 (SUTURE) ×6
SUT VIC AB 2-0 CT1 TAPERPNT 27 (SUTURE) ×3 IMPLANT
SYR 50ML LL SCALE MARK (SYRINGE) ×3 IMPLANT
TOWEL OR 17X24 6PK STRL BLUE (TOWEL DISPOSABLE) ×3 IMPLANT
TOWEL OR 17X26 10 PK STRL BLUE (TOWEL DISPOSABLE) ×3 IMPLANT
TRAY CATH 16FR W/PLASTIC CATH (SET/KITS/TRAYS/PACK) ×3 IMPLANT
UNDERPAD 30X30 (UNDERPADS AND DIAPERS) ×3 IMPLANT
WRAP KNEE MAXI GEL POST OP (GAUZE/BANDAGES/DRESSINGS) IMPLANT

## 2017-07-22 NOTE — Anesthesia Preprocedure Evaluation (Addendum)
Anesthesia Evaluation  Patient identified by MRN, date of birth, ID band Patient awake    Reviewed: Allergy & Precautions, H&P , NPO status , Patient's Chart, lab work & pertinent test results, reviewed documented beta blocker date and time   Airway Mallampati: III  TM Distance: >3 FB Neck ROM: full    Dental no notable dental hx. (+) Edentulous Upper, Dental Advisory Given, Edentulous Lower   Pulmonary Current Smoker,    Pulmonary exam normal breath sounds clear to auscultation       Cardiovascular Exercise Tolerance: Good hypertension, Pt. on medications and Pt. on home beta blockers +CHF   Rhythm:regular Rate:Normal     Neuro/Psych    GI/Hepatic   Endo/Other    Renal/GU      Musculoskeletal   Abdominal   Peds  Hematology   Anesthesia Other Findings Off Xarelto for 6 days Systolic function was normal. The estimated ejection fraction was in the range of 55% to 60%. Wall motion was normal; there were no regional wall motion abnormalities. Left ventricular diastolic function parameters were normal.    Reproductive/Obstetrics                          Anesthesia Physical Anesthesia Plan  ASA: III  Anesthesia Plan: Spinal   Post-op Pain Management:    Induction:   PONV Risk Score and Plan: 2 and Dexamethasone, Ondansetron and Treatment may vary due to age or medical condition  Airway Management Planned:   Additional Equipment:   Intra-op Plan:   Post-operative Plan:   Informed Consent: I have reviewed the patients History and Physical, chart, labs and discussed the procedure including the risks, benefits and alternatives for the proposed anesthesia with the patient or authorized representative who has indicated his/her understanding and acceptance.     Plan Discussed with:   Anesthesia Plan Comments: (  )        Anesthesia Quick Evaluation

## 2017-07-22 NOTE — Transfer of Care (Signed)
Immediate Anesthesia Transfer of Care Note  Patient: Brian [name]  Procedure(s) Performed: LEFT TOTAL KNEE ARTHROPLASTY (Left Knee)  Patient Location: PACU  Anesthesia Type:MAC and Spinal  Level of Consciousness: drowsy and patient cooperative  Airway & Oxygen Therapy: Patient Spontanous Breathing and Patient connected to face mask oxygen  Post-op Assessment: Report given to RN and Post -op Vital signs reviewed and stable  Post vital signs: Reviewed  Last Vitals:  Vitals:   07/22/17 1425 07/22/17 1701  BP:  (!) 99/59  Pulse: 74 69  Resp: (!) 21 17  Temp:  36.5 C  SpO2: 96% 98%    Last Pain:  Vitals:   07/22/17 1323  TempSrc:   PainSc: 8       Patients Stated Pain Goal: 2 (19/16/60 6004)  Complications: No apparent anesthesia complications

## 2017-07-22 NOTE — H&P (Signed)
PREOPERATIVE H&P  Chief Complaint: left knee degenerative joint disease  HPI: Brian [name] is a 67 y.o. male who presents for surgical treatment of left knee degenerative joint disease.  He denies any changes in medical history.  Past Medical History:  Diagnosis Date  . Anemia   . Anxiety   . Arthritis   . Atrial flutter ([place])    a. 08/2012  . CHF (congestive heart failure) ([place])   . Dysrhythmia   . Gout   . History of kidney stones    passed  . Hypertension   . Obesity   . Pneumonia 09/2016  . Shortness of breath    witrh exterion  . Tobacco abuse    Past Surgical History:  Procedure Laterality Date  . A-FLUTTER ABLATION N/A 12/29/2016   Procedure: A-Flutter Ablation;  Surgeon: [name], MD;  Location: [facility];  Service: Cardiovascular;  Laterality: N/A;  . CARDIOVERSION N/A 11/05/2016   Procedure: CARDIOVERSION;  Surgeon: Sueanne Margarita, MD;  Location: [facility];  Service: Cardiovascular;  Laterality: N/A;  . IR THORACENTESIS ASP PLEURAL SPACE W/IMG GUIDE  10/01/2016  . MICROLARYNGOSCOPY  09/02/2007   with excision of right vocal cord mass, Dr. Constance Holster   Social History   Socioeconomic History  . Marital status: Divorced    Spouse name: Not on file  . Number of children: 3  . Years of education: Not on file  . Highest education level: Not on file  Social Needs  . Financial resource strain: Not on file  . Food insecurity - worry: Not on file  . Food insecurity - inability: Not on file  . Transportation needs - medical: Not on file  . Transportation needs - non-medical: Not on file  Occupational History  . Not on file  Tobacco Use  . Smoking status: Current Every Day Smoker    Packs/day: 0.13    Years: 50.00    Pack years: 6.50    Types: Cigarettes  . Smokeless tobacco: Never Used  . Tobacco comment: "quit off and on"  Substance and Sexual Activity  . Alcohol use: No    Comment: socially  . Drug use: No    Comment: "none since 11/2016"  cocaine (sister is not aware)  . Sexual activity: Not Currently  Other Topics Concern  . Not on file  Social History Narrative   Lives in [place]    Works at a convenience store   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   Divorced with 3 children and 9 grandchildren, he states ex-wife is still a friend   Family History  Problem Relation Age of Onset  . Cancer Mother   . Heart disease Father   . Heart attack Father   . Cancer Father   . Diabetes Sister    Allergies  Allergen Reactions  . Tape Rash and Other (See Comments)    Paper tape is preferred, please!!   Prior to Admission medications   Medication Sig Start Date End Date Taking? Authorizing Provider  Adalimumab (HUMIRA) 40 MG/0.4ML PSKT Inject 40 mg into the skin every 14 (fourteen) days.   Yes [provider]  docusate sodium (COLACE) 100 MG capsule Take 200 mg by mouth 2 (two) times daily.   Yes [provider]  folic acid (FOLVITE) 1 MG tablet Take 1 mg by mouth daily. 04/15/17  Yes [provider]  furosemide (LASIX) 40 MG tablet Take 2 tablets (80 mg total) by mouth 2 (two) times  daily. 04/26/17  Yes Jerline Pain, MD  HYDROcodone-acetaminophen (NORCO/VICODIN) 5-325 MG tablet Take 1 tablet by mouth every 4 (four) hours as needed. 11/23/16  Yes Nona Dell, PA-C  methotrexate (RHEUMATREX) 2.5 MG tablet Take 20 mg by mouth once a week. 8 tabs on Friday 04/26/17  Yes [provider]  metoprolol succinate (TOPROL XL) 25 MG 24 hr tablet Take 1 tablet (25 mg total) 2 (two) times daily by mouth. 05/11/17  Yes Skains, Thana Farr, MD  potassium chloride SA (K-DUR,KLOR-CON) 20 MEQ tablet Take 1 tablet (20 mEq total) by mouth daily. 10/26/16  Yes Jerline Pain, MD  ranitidine (ZANTAC) 150 MG tablet Take 150 mg by mouth daily.   Yes [provider]  XARELTO 20 MG TABS tablet TAKE 1 TABLET (20 MG TOTAL) BY MOUTH DAILY WITH SUPPER. 06/28/17  Yes Allred, Jeneen Rinks, MD     Positive ROS: All  other systems have been reviewed and were otherwise negative with the exception of those mentioned in the HPI and as above.  Physical Exam: General: Alert, no acute distress Cardiovascular: No pedal edema Respiratory: No cyanosis, no use of accessory musculature GI: abdomen soft Skin: No lesions in the area of chief complaint Neurologic: Sensation intact distally Psychiatric: Patient is competent for consent with normal mood and affect Lymphatic: no lymphedema  MUSCULOSKELETAL: exam stable  Assessment: left knee degenerative joint disease  Plan: Plan for Procedure(s): LEFT TOTAL KNEE ARTHROPLASTY  The risks benefits and alternatives were discussed with the patient including but not limited to the risks of nonoperative treatment, versus surgical intervention including infection, bleeding, nerve injury,  blood clots, cardiopulmonary complications, morbidity, mortality, among others, and they were willing to proceed.   Eduard Roux, MD   07/22/2017 7:55 AM

## 2017-07-22 NOTE — Anesthesia Procedure Notes (Signed)
Anesthesia Regional Block: Adductor canal block   Pre-Anesthetic Checklist: ,, timeout performed, Correct Patient, Correct Site, Correct Laterality, Correct Procedure, Correct Position, site marked, Risks and benefits discussed, pre-op evaluation,  At surgeon's request and post-op pain management  Laterality: Left  Prep: chloraprep       Needles:   Needle Type: Echogenic Needle     Needle Length: 9cm  Needle Gauge: 21     Additional Needles:   Procedures:,,,, ultrasound used (permanent image in chart),,,,  Narrative:  Start time: 07/22/2017 2:12 PM End time: 07/22/2017 2:19 PM Injection made incrementally with aspirations every 5 mL. Anesthesiologist: Lyndle Herrlich, MD

## 2017-07-22 NOTE — Op Note (Signed)
Total Knee Arthroplasty Procedure Note  Preoperative diagnosis: Left knee osteoarthritis  Postoperative diagnosis:same  Operative procedure: Left total knee arthroplasty. CPT [phone]  Surgeon: N. Eduard Roux, MD  Assist: Madalyn Rob, PA-C; necessary for the timely completion of procedure and due to complexity of procedure.  Anesthesia: Spinal, regional  Tourniquet time: 67 mins  Implants used: Smith and Nephew Femur: Legion PS 6 Tibia: Genesis II 6 Patella: 35 mm, 9 thick Polyethylene: 9 mm  Indication: Brian [name] is a 67 y.o. year old male with a history of knee pain. Having failed conservative management, the patient elected to proceed with a total knee arthroplasty.  We have reviewed the risk and benefits of the surgery and they elected to proceed after voicing understanding.  Procedure:  After informed consent was obtained and understanding of the risk were voiced including but not limited to bleeding, infection, damage to surrounding structures including nerves and vessels, blood clots, leg length inequality and the failure to achieve desired results, the operative extremity was marked with verbal confirmation of the patient in the holding area.   The patient was then brought to the operating room and transported to the operating room table in the supine position.  A tourniquet was applied to the operative extremity around the upper thigh. The operative limb was then prepped and draped in the usual sterile fashion and preoperative antibiotics were administered.  A time out was performed prior to the start of surgery confirming the correct extremity, preoperative antibiotic administration, as well as team members, implants and instruments available for the case. Correct surgical site was also confirmed with preoperative radiographs. The limb was then elevated for exsanguination and the tourniquet was inflated. A midline incision was made and a standard medial  parapatellar approach was performed.  The patella was prepared and sized to a 35 mm.  A cover was placed on the patella for protection from retractors.  We then turned our attention to the femur. Posterior cruciate ligament was sacrificed. Start site was drilled in the femur and the intramedullary distal femoral cutting guide was placed, set at 5 degrees valgus, taking 9 mm of distal resection. The distal cut was made. Osteophytes were then removed. Next, the proximal tibial cutting guide was placed with appropriate slope, varus/valgus alignment and depth of resection. The proximal tibial cut was made. Gap blocks were then used to assess the extension gap and alignment, and appropriate soft tissue releases were performed. Attention was turned back to the femur, which was sized using the sizing guide to a size 6. Appropriate rotation of the femoral component was determined using epicondylar axis, Whiteside's line, and assessing the flexion gap under ligament tension. The appropriate size 4-in-1 cutting block was placed and cuts were made. Posterior femoral osteophytes and uncapped bone were then removed with the curved osteotome. The tibia was sized for a size 6 component. The femoral box-cutting guide was placed and prepared for a PS femoral component. Trial components were placed, and stability was checked in full extension, mid-flexion, and deep flexion. Proper tibial rotation was determined and marked.  The patella tracked well without a lateral release. Trial components were then removed and tibial preparation performed. A posterior capsular injection comprising of 20 cc of 1.3% exparel and 40 cc of normal saline was performed for postoperative pain control. The bony surfaces were irrigated with a pulse lavage and then dried. Bone cement was vacuum mixed on the back table, and the final components sized above were cemented  into place. After cement had finished curing, excess cement was removed. The stability  of the construct was re-evaluated throughout a range of motion and found to be acceptable. The trial liner was removed, the knee was copiously irrigated, and the knee was re-evaluated for any excess bone debris. The real polyethylene liner, 9 mm thick, was inserted and checked to ensure the locking mechanism had engaged appropriately. The tourniquet was deflated and hemostasis was achieved. The wound was irrigated with normal saline.  One gram of vancomycin powder was placed in the surgical bed. A drain was not placed. Capsular closure was performed with a #1 vicryl, subcutaneous fat closed with a 0 vicryl suture, then subcutaneous tissue closed with interrupted 2.0 vicryl suture. The skin was then closed with a 3.0 monocryl. A sterile dressing was applied.  The patient was awakened in the operating room and taken to recovery in stable condition. All sponge, needle, and instrument counts were correct at the end of the case.  Position: supine  Complications: none.  Time Out: performed   Drains/Packing: none  Estimated blood loss: minimal  Returned to Recovery Room: in good condition.   Antibiotics: yes   Mechanical VTE (DVT) Prophylaxis: sequential compression devices, TED thigh-high  Chemical VTE (DVT) Prophylaxis: aspirin  Fluid Replacement  Crystalloid: see anesthesia record Blood: none  FFP: none   Specimens Removed: 1 to pathology   Sponge and Instrument Count Correct? yes   PACU: portable radiograph - knee AP and Lateral   Admission: inpatient status  Plan/RTC: Return in 2 weeks for wound check.   Weight Bearing/Load Lower Extremity: full   N. Eduard Roux, MD [place] 4:17 PM

## 2017-07-22 NOTE — Anesthesia Procedure Notes (Signed)
Procedure Name: MAC Date/Time: 07/22/2017 2:30 PM Performed by: Jenne Campus, CRNA Pre-anesthesia Checklist: Patient identified, Emergency Drugs available, Suction available and Patient being monitored Oxygen Delivery Method: Simple face mask

## 2017-07-22 NOTE — Progress Notes (Signed)
Orthopedic Tech Progress Note Patient Details:  Brian [name] 12/18/1950 [id number]  CPM Left Knee CPM Left Knee: On Left Knee Flexion (Degrees): 90 Left Knee Extension (Degrees): 0  Post Interventions Patient Tolerated: Well Instructions Provided: Care of device Ortho Devices Ortho Device/Splint Location: applied overhead frame Ortho Device/Splint Interventions: Ordered, Application, Adjustment   Post Interventions Patient Tolerated: Well Instructions Provided: Care of device   [name] 07/22/2017, 5:32 PM

## 2017-07-22 NOTE — Anesthesia Procedure Notes (Signed)
Spinal  Patient location during procedure: OR Staffing Anesthesiologist: Lyndle Herrlich, MD Spinal Block Patient position: sitting Prep: DuraPrep Patient monitoring: heart rate, blood pressure and continuous pulse ox Approach: right paramedian Location: L3-4 Injection technique: single-shot Needle Needle type: Sprotte  Needle gauge: 24 G Needle length: 9 cm Assessment Sensory level: T4 Additional Notes Spinal Dosage in OR  .75% Bupivicaine ml       1.9 LLD x 3 min

## 2017-07-23 ENCOUNTER — Encounter (HOSPITAL_COMMUNITY): Payer: Self-pay | Admitting: Orthopaedic Surgery

## 2017-07-23 LAB — BASIC METABOLIC PANEL
Anion gap: 10 (ref 5–15)
BUN: 16 mg/dL (ref 6–20)
CALCIUM: 8.6 mg/dL — AB (ref 8.9–10.3)
CHLORIDE: 103 mmol/L (ref 101–111)
CO2: 22 mmol/L (ref 22–32)
Creatinine, Ser: 0.81 mg/dL (ref 0.61–1.24)
GFR calc Af Amer: >60 mL/min (ref >60)
GFR calc non Af Amer: >60 mL/min (ref >60)
GLUCOSE: 137 mg/dL — AB (ref 65–99)
Potassium: 4.8 mmol/L (ref 3.5–5.1)
Sodium: 135 mmol/L (ref 135–145)

## 2017-07-23 LAB — CBC
HEMATOCRIT: 36.3 % — AB (ref 39.0–52.0)
HEMOGLOBIN: 11.3 g/dL — AB (ref 13.0–17.0)
MCH: 28.8 pg (ref 26.0–34.0)
MCHC: 31.1 g/dL (ref 30.0–36.0)
MCV: 92.4 fL (ref 78.0–100.0)
Platelets: 267 10*3/uL (ref 150–400)
RBC: 3.93 MIL/uL — ABNORMAL LOW (ref 4.22–5.81)
RDW: 15.8 % — AB (ref 11.5–15.5)
WBC: 14 10*3/uL — ABNORMAL HIGH (ref 4.0–10.5)

## 2017-07-23 MED ORDER — CEPHALEXIN 500 MG PO CAPS: 500.0000 mg | ORAL_CAPSULE | Freq: Four times a day (QID) | ORAL | 0 refills | Status: DC

## 2017-07-23 NOTE — Evaluation (Signed)
Occupational Therapy Evaluation Patient Details Name: REAL CONA MRN: [id number] DOB: 05-30-1951 Today's Date: 07/23/2017    History of Present Illness Brian [name] is a 67 y.o. male who presents for surgical treatment of left knee degenerative joint disease. Pt s/p L TKR   Clinical Impression   Patient is s/p L TKA surgery resulting in functional limitations due to the deficits listed below (see OT problem list). PTA was requiring mod (A) for LB adls. Pt was urinating in a [employer] cup and pouring it into a bedside commode. Pt only stand pivot to 3n1 unless sister present. Pt demonstrates transfer to sink level this session.  Patient will benefit from skilled OT acutely to increase independence and safety with ADLS to allow discharge SNF.     Follow Up Recommendations  SNF(pt requesting SNF due to decr support)    Equipment Recommendations  3 in 1 bedside commode    Recommendations for Other Services       Precautions / Restrictions Precautions Precautions: Fall Restrictions Weight Bearing Restrictions: Yes LLE Weight Bearing: Weight bearing as tolerated      Mobility Bed Mobility Overal bed mobility: Needs Assistance Bed Mobility: Supine to Sit     Supine to sit: Supervision     General bed mobility comments: pt able to come to eob and declined transfer to chair at teh end of session. pt sitting on the eob with tray pending lunch  Transfers Overall transfer level: Needs assistance Equipment used: Rolling walker (2 wheeled) Transfers: Sit to/from Stand Sit to Stand: Min guard         General transfer comment: pt with proper return demo of hand placement    Balance                                           ADL either performed or assessed with clinical judgement   ADL Overall ADL's : Needs assistance/impaired Eating/Feeding: Independent   Grooming: Wash/dry hands;Supervision/safety   Upper Body Bathing: Supervision/  safety;Sitting   Lower Body Bathing: Sit to/from stand;Minimal assistance           Toilet Transfer: Min guard;RW           Functional mobility during ADLs: Min guard;Rolling walker General ADL Comments: pt reports for the last 2 months being out of work and requiring his sister push him in a wheelchair anywhere he goes outside his home. pt reports "i have relied on her for everything. she is a febile diabetic and i can't keep relying on her. " Daughter present during session but reports having RA , living out of town and inability to help her father.   Educated patient on knee full extension with return demonstration, never to wash directly on incision site, always use fresh clean linen (one time use then place in laundry), avoid water under bandage and benefits of wrapping dressing, sleeping positioning, avoid putting pillow under knee.    Vision Baseline Vision/History: Wears glasses Wears Glasses: At all times       Perception     Praxis      Pertinent Vitals/Pain Pain Assessment: 0-10 Pain Score: 4  Pain Location: Left knee Pain Descriptors / Indicators: Discomfort Pain Intervention(s): Limited activity within patient's tolerance;Monitored during session;Premedicated before session;Repositioned     Hand Dominance Right   Extremity/Trunk Assessment Upper Extremity Assessment Upper Extremity Assessment: Overall WFL for  tasks assessed   Lower Extremity Assessment Lower Extremity Assessment: Defer to PT evaluation   Cervical / Trunk Assessment Cervical / Trunk Assessment: Normal   Communication Communication Communication: No difficulties   Cognition Arousal/Alertness: Awake/alert Behavior During Therapy: WFL for tasks assessed/performed Overall Cognitive Status: Within Functional Limits for tasks assessed                                     General Comments  ice applied at end of session    Exercises     Shoulder Instructions      Home  Living Family/patient expects to be discharged to:: Skilled nursing facility                                        Prior Functioning/Environment Level of Independence: Needs assistance  Gait / Transfers Assistance Needed: mod Indep with RW              OT Problem List: Decreased strength;Decreased activity tolerance;Impaired balance (sitting and/or standing);Decreased safety awareness;Decreased knowledge of use of DME or AE;Decreased knowledge of precautions;Pain      OT Treatment/Interventions: Self-care/ADL training;Therapeutic exercise;DME and/or AE instruction;Therapeutic activities;Patient/family education;Balance training    OT Goals(Current goals can be found in the care plan section) Acute Rehab OT Goals Patient Stated Goal: To be independent again and d/c home after short SNF stay. OT Goal Formulation: With patient Time For Goal Achievement: 08/06/17 Potential to Achieve Goals: Good  OT Frequency: Min 2X/week   Barriers to D/C: Decreased caregiver support          Co-evaluation              AM-PAC PT "6 Clicks" Daily Activity     Outcome Measure Help from another person eating meals?: None Help from another person taking care of personal grooming?: None Help from another person toileting, which includes using toliet, bedpan, or urinal?: None Help from another person bathing (including washing, rinsing, drying)?: None Help from another person to put on and taking off regular upper body clothing?: None Help from another person to put on and taking off regular lower body clothing?: A Little 6 Click Score: 23   End of Session Equipment Utilized During Treatment: Gait belt;Rolling walker CPM Left Knee CPM Left Knee: Off Nurse Communication: Mobility status;Precautions  Activity Tolerance: Patient tolerated treatment well Patient left: in bed;with call bell/phone within reach;with family/visitor present  OT Visit Diagnosis: Unsteadiness on  feet (R26.81)                Time: 7672-0947 OT Time Calculation (min): 15 min Charges:  OT General Charges $OT Visit: 1 Visit OT Evaluation $OT Eval Moderate Complexity: 1 Mod G-Codes:      Jeri Modena   OTR/L Pager: [phone] Office: [phone] .   Parke Poisson B 07/23/2017, 3:17 PM

## 2017-07-23 NOTE — Discharge Instructions (Addendum)
INSTRUCTIONS AFTER JOINT REPLACEMENT   o Remove items at home which could result in a fall. This includes throw rugs or furniture in walking pathways o ICE to the affected joint every three hours while awake for 30 minutes at a time, for at least the first 3-5 days, and then as needed for pain and swelling.  Continue to use ice for pain and swelling. You may notice swelling that will progress down to the foot and ankle.  This is normal after surgery.  Elevate your leg when you are not up walking on it.   o Continue to use the breathing machine you got in the hospital (incentive spirometer) which will help keep your temperature down.  It is common for your temperature to cycle up and down following surgery, especially at night when you are not up moving around and exerting yourself.  The breathing machine keeps your lungs expanded and your temperature down.   DIET:  As you were doing prior to hospitalization, we recommend a well-balanced diet.  DRESSING / WOUND CARE / SHOWERING  You may change your surgical dressing 5 days after surgery.  Then change the dressing every day with sterile gauze.  Please use good hand washing techniques before changing the dressing.  Do not use any lotions or creams on the incision until instructed by your surgeon.  You may shower while you have the surgical dressing which is waterproof.  After removal of surgical dressing, you must cover the incision when showering.  ACTIVITY  o Increase activity slowly as tolerated, but follow the weight bearing instructions below.   o No driving for 6 weeks or until further direction given by your physician.  You cannot drive while taking narcotics.  o No lifting or carrying greater than 10 lbs. until further directed by your surgeon. o Avoid periods of inactivity such as sitting longer than an hour when not asleep. This helps prevent blood clots.  o You may return to work once you are authorized by your doctor.     WEIGHT  BEARING   Weight bearing as tolerated with assist device (walker, cane, etc) as directed, use it as long as suggested by your surgeon or therapist, typically at least 4-6 weeks.   EXERCISES  Results after joint replacement surgery are often greatly improved when you follow the exercise, range of motion and muscle strengthening exercises prescribed by your doctor. Safety measures are also important to protect the joint from further injury. Any time any of these exercises cause you to have increased pain or swelling, decrease what you are doing until you are comfortable again and then slowly increase them. If you have problems or questions, call your caregiver or physical therapist for advice.   Rehabilitation is important following a joint replacement. After just a few days of immobilization, the muscles of the leg can become weakened and shrink (atrophy).  These exercises are designed to build up the tone and strength of the thigh and leg muscles and to improve motion. Often times heat used for twenty to thirty minutes before working out will loosen up your tissues and help with improving the range of motion but do not use heat for the first two weeks following surgery (sometimes heat can increase post-operative swelling).   These exercises can be done on a training (exercise) mat, on the floor, on a table or on a bed. Use whatever works the best and is most comfortable for you.    Use music or television while  you are exercising so that the exercises are a pleasant break in your day. This will make your life better with the exercises acting as a break in your routine that you can look forward to.   Perform all exercises about fifteen times, three times per day or as directed.  You should exercise both the operative leg and the other leg as well. ° °Exercises include: °  °• Quad Sets - Tighten up the muscle on the front of the thigh (Quad) and hold for 5-10 seconds.   °• Straight Leg Raises - With your  knee straight (if you were given a brace, keep it on), lift the leg to 60 degrees, hold for 3 seconds, and slowly lower the leg.  Perform this exercise against resistance later as your leg gets stronger.  °• Leg Slides: Lying on your back, slowly slide your foot toward your buttocks, bending your knee up off the floor (only go as far as is comfortable). Then slowly slide your foot back down until your leg is flat on the floor again.  °• Angel Wings: Lying on your back spread your legs to the side as far apart as you can without causing discomfort.  °• Hamstring Strength:  Lying on your back, push your heel against the floor with your leg straight by tightening up the muscles of your buttocks.  Repeat, but this time bend your knee to a comfortable angle, and push your heel against the floor.  You may put a pillow under the heel to make it more comfortable if necessary.  ° °A rehabilitation program following joint replacement surgery can speed recovery and prevent re-injury in the future due to weakened muscles. Contact your doctor or a physical therapist for more information on knee rehabilitation.  ° ° °CONSTIPATION ° °Constipation is defined medically as fewer than three stools per week and severe constipation as less than one stool per week.  Even if you have a regular bowel pattern at home, your normal regimen is likely to be disrupted due to multiple reasons following surgery.  Combination of anesthesia, postoperative narcotics, change in appetite and fluid intake all can affect your bowels.  ° °YOU MUST use at least one of the following options; they are listed in order of increasing strength to get the job done.  They are all available over the counter, and you may need to use some, POSSIBLY even all of these options:   ° °Drink plenty of fluids (prune juice may be helpful) and high fiber foods °Colace 100 mg by mouth twice a day  °Senokot for constipation as directed and as needed Dulcolax (bisacodyl), take  with full glass of water  °Miralax (polyethylene glycol) once or twice a day as needed. ° °If you have tried all these things and are unable to have a bowel movement in the first 3-4 days after surgery call either your surgeon or your primary doctor.   ° °If you experience loose stools or diarrhea, hold the medications until you stool forms back up.  If your symptoms do not get better within 1 week or if they get worse, check with your doctor.  If you experience "the worst abdominal pain ever" or develop nausea or vomiting, please contact the office immediately for further recommendations for treatment. ° ° °ITCHING:  If you experience itching with your medications, try taking only a single pain pill, or even half a pain pill at a time.  You can also use Benadryl over the counter   for itching or also to help with sleep.   TED HOSE STOCKINGS:  Use stockings on both legs until for at least 2 weeks or as directed by physician office. They may be removed at night for sleeping.  MEDICATIONS:  See your medication summary on the After Visit Summary that nursing will review with you.  You may have some home medications which will be placed on hold until you complete the course of blood thinner medication.  It is important for you to complete the blood thinner medication as prescribed.  PRECAUTIONS:  If you experience chest pain or shortness of breath - call 911 immediately for transfer to the hospital emergency department.   If you develop a fever greater that 101 F, purulent drainage from wound, increased redness or drainage from wound, foul odor from the wound/dressing, or calf pain - CONTACT YOUR SURGEON.                                                   FOLLOW-UP APPOINTMENTS:  If you do not already have a post-op appointment, please call the office for an appointment to be seen by your surgeon.  Guidelines for how soon to be seen are listed in your After Visit Summary, but are typically between 1-4 weeks  after surgery.  OTHER INSTRUCTIONS:   Knee Replacement:  Do not place pillow under knee, focus on keeping the knee straight while resting. CPM instructions: 0-90 degrees, 2 hours in the morning, 2 hours in the afternoon, and 2 hours in the evening. Place foam block, curve side up under heel at all times except when in CPM or when walking.  DO NOT modify, tear, cut, or change the foam block in any way.  MAKE SURE YOU:   Understand these instructions.   Get help right away if you are not doing well or get worse.    Thank you for letting us be a part of your medical care team.  It is a privilege we respect greatly.  We hope these instructions will help you stay on track for a fast and full recovery!    Information on my medicine - XARELTO (Rivaroxaban)  This medication education was reviewed with me or my healthcare representative as part of my discharge preparation.   Why was Xarelto prescribed for you? Xarelto was prescribed for you to reduce the risk of a blood clot forming that can cause a stroke if you have a medical condition called atrial fibrillation (a type of irregular heartbeat).  What do you need to know about xarelto ? Take your Xarelto ONCE DAILY at the same time every day with your evening meal. If you have difficulty swallowing the tablet whole, you may crush it and mix in applesauce just prior to taking your dose.  Take Xarelto exactly as prescribed by your doctor and DO NOT stop taking Xarelto without talking to the doctor who prescribed the medication.  Stopping without other stroke prevention medication to take the place of Xarelto may increase your risk of developing a clot that causes a stroke.  Refill your prescription before you run out.  After discharge, you should have regular check-up appointments with your healthcare provider that is prescribing your Xarelto.  In the future your dose may need to be changed if your kidney function or weight changes by a  significant  amount.  What do you do if you miss a dose? If you are taking Xarelto ONCE DAILY and you miss a dose, take it as soon as you remember on the same day then continue your regularly scheduled once daily regimen the next day. Do not take two doses of Xarelto at the same time or on the same day.   Important Safety Information A possible side effect of Xarelto is bleeding. You should call your healthcare provider right away if you experience any of the following: ? Bleeding from an injury or your nose that does not stop. ? Unusual colored urine (red or dark brown) or unusual colored stools (red or black). ? Unusual bruising for unknown reasons. ? A serious fall or if you hit your head (even if there is no bleeding).  Some medicines may interact with Xarelto and might increase your risk of bleeding while on Xarelto. To help avoid this, consult your healthcare provider or pharmacist prior to using any new prescription or non-prescription medications, including herbals, vitamins, non-steroidal anti-inflammatory drugs (NSAIDs) and supplements.  This website has more information on Xarelto: [email or web address]

## 2017-07-23 NOTE — NC FL2 (Signed)
[place] LEVEL OF CARE SCREENING TOOL     IDENTIFICATION  Patient Name: Brian [name] Birthdate: 01-16-1951 Sex: male Admission Date (Current Location): 07/22/2017  [facility] and [place] Number:  Herbalist and Address:  [facility], [place] [address], [place], [place] [address]      Provider Number: [id number]  Attending Physician Name and Address:  Leandrew Koyanagi, MD  Relative Name and Phone Number:  Manya Silvas, sister, [phone]    Current Level of Care: Hospital Recommended Level of Care: [place] Prior Approval Number:    Date Approved/Denied:   PASRR Number: [id number]  Discharge Plan: SNF    Current Diagnoses: Patient Active Problem List   Diagnosis Date Noted  . 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 Cellulitis 01/22/2017  . Polyarthritis 12/01/2016  . Cellulitis of left leg 11/29/2016  . Sepsis ([place]) 11/29/2016  . Effusion, left knee 11/13/2016  . Persistent atrial fibrillation ([place])   . Dyspnea   . Positive urine drug screen   . CAP (community acquired pneumonia) 09/30/2016  . Community acquired pneumonia of left lower lobe of lung ([place])   . Pleural effusion   . Tachypnea   . CVA (cerebral infarction) 09/27/2015  . Right hand pain 09/27/2015  . History of gout 09/27/2015  . Cocaine abuse ([place]) 09/27/2015  . TIA (transient ischemic attack) 09/27/2015  . Slurred speech   . Primary osteoarthritis of right hand   . Leukocytosis   . Essential hypertension   . Chronic diastolic CHF (congestive heart failure) ([place])   . HTN (hypertension) 11/22/2013  . Chronic diastolic heart failure ([place]) 11/22/2013  . Noncompliance 11/17/2013  . Atrial flutter with rapid ventricular response ([place]) 11/16/2013  . Acute on chronic diastolic CHF (congestive heart failure) ([place]) 09/05/2012  . Tobacco abuse   . Obesity- BMI 35   . Diastolic CHF ([place])   . Atrial flutter ([place])   . Anxiety      Orientation RESPIRATION BLADDER Height & Weight     Self, Time, Situation, Place  Normal Continent Weight: 232 lb (105.2 kg) Height:  5\' 10"  (177.8 cm)  BEHAVIORAL SYMPTOMS/MOOD NEUROLOGICAL BOWEL NUTRITION STATUS      Continent Diet(See DC Summary)  AMBULATORY STATUS COMMUNICATION OF NEEDS Skin   Limited Assist Verbally Surgical wounds                       Personal Care Assistance Level of Assistance  Bathing, Feeding, Dressing Bathing Assistance: Limited assistance Feeding assistance: Independent Dressing Assistance: Limited assistance     Functional Limitations Info  Sight, Hearing, Speech Sight Info: Adequate Hearing Info: Adequate Speech Info: Adequate    SPECIAL CARE FACTORS FREQUENCY  PT (By licensed PT), OT (By licensed OT)     PT Frequency: 5x week OT Frequency: 5x week            Contractures      Additional Factors Info  Code Status, Allergies Code Status Info: Full  Allergies Info: TAPE            Current Medications (07/23/2017):  This is the current hospital active medication list Current Facility-Administered Medications  Medication Dose Route Frequency Provider Last Rate Last Dose  . acetaminophen (TYLENOL) tablet 650 mg  650 mg Oral Q4H PRN Leandrew Koyanagi, MD       Or  . acetaminophen (TYLENOL) suppository 650 mg  650 mg Rectal Q4H PRN Erlinda Hong, [place]  M, MD      . acetaminophen (TYLENOL) tablet 1,000 mg  1,000 mg Oral Q6H Leandrew Koyanagi, MD   1,000 mg at 07/23/17 0545  . alum & mag hydroxide-simeth (MAALOX/MYLANTA) 200-200-20 MG/5ML suspension 30 mL  30 mL Oral Q4H PRN Leandrew Koyanagi, MD      . cephALEXin (KEFLEX) capsule 500 mg  500 mg Oral Q6H Leandrew Koyanagi, MD   500 mg at 07/23/17 0545  . diphenhydrAMINE (BENADRYL) 12.5 MG/5ML elixir 25 mg  25 mg Oral Q4H PRN Leandrew Koyanagi, MD      . docusate sodium (COLACE) capsule 200 mg  200 mg Oral BID Leandrew Koyanagi, MD   200 mg at 07/23/17 0930  . famotidine (PEPCID) tablet 20 mg  20 mg Oral QHS  Leandrew Koyanagi, MD   20 mg at 07/22/17 2301  . folic acid (FOLVITE) tablet 1 mg  1 mg Oral Daily Leandrew Koyanagi, MD   1 mg at 07/23/17 0931  . Influenza vac split quadrivalent PF (FLUZONE HIGH-DOSE) injection 0.5 mL  0.5 mL Intramuscular Tomorrow-1000 Leandrew Koyanagi, MD      . ketorolac (TORADOL) 15 MG/ML injection 15 mg  15 mg Intravenous Q6H Leandrew Koyanagi, MD   15 mg at 07/23/17 0545  . lactated ringers infusion   Intravenous Continuous Nolon Nations, MD 10 mL/hr at 07/22/17 1332    . magnesium citrate solution 1 Bottle  1 Bottle Oral Once PRN Leandrew Koyanagi, MD      . menthol-cetylpyridinium (CEPACOL) lozenge 3 mg  1 lozenge Oral PRN Leandrew Koyanagi, MD       Or  . phenol (CHLORASEPTIC) mouth spray 1 spray  1 spray Mouth/Throat PRN Leandrew Koyanagi, MD      . methocarbamol (ROBAXIN) tablet 500 mg  500 mg Oral Q6H PRN Leandrew Koyanagi, MD   500 mg at 07/23/17 8242   Or  . methocarbamol (ROBAXIN) 500 mg in dextrose 5 % 50 mL IVPB  500 mg Intravenous Q6H PRN Leandrew Koyanagi, MD      . Derrill Memo ON 07/30/2017] methotrexate (RHEUMATREX) tablet 20 mg  20 mg Oral Weekly Leandrew Koyanagi, MD      . metoCLOPramide (REGLAN) tablet 5-10 mg  5-10 mg Oral Q8H PRN Leandrew Koyanagi, MD       Or  . metoCLOPramide (REGLAN) injection 5-10 mg  5-10 mg Intravenous Q8H PRN Leandrew Koyanagi, MD      . metoprolol succinate (TOPROL-XL) 24 hr tablet 25 mg  25 mg Oral BID Leandrew Koyanagi, MD   25 mg at 07/23/17 0930  . morphine 2 MG/ML injection 1 mg  1 mg Intravenous Q2H PRN Leandrew Koyanagi, MD   1 mg at 07/22/17 2015  . ondansetron (ZOFRAN) tablet 4 mg  4 mg Oral Q6H PRN Leandrew Koyanagi, MD       Or  . ondansetron [facility]) injection 4 mg  4 mg Intravenous Q6H PRN Leandrew Koyanagi, MD      . oxyCODONE (Oxy IR/ROXICODONE) immediate release tablet 10 mg  10 mg Oral Q3H PRN Leandrew Koyanagi, MD   10 mg at 07/23/17 0723  . oxyCODONE (Oxy IR/ROXICODONE) immediate release tablet 5 mg  5 mg Oral Q3H PRN Leandrew Koyanagi, MD      . oxyCODONE (OXYCONTIN) 12 hr  tablet 15 mg  15 mg Oral Q12H Leandrew Koyanagi, MD   15 mg at  07/23/17 0930  . pneumococcal 23 valent vaccine (PNU-IMMUNE) injection 0.5 mL  0.5 mL Intramuscular Tomorrow-1000 Leandrew Koyanagi, MD      . polyethylene glycol (MIRALAX / GLYCOLAX) packet 17 g  17 g Oral Daily PRN Leandrew Koyanagi, MD      . potassium chloride SA (K-DUR,KLOR-CON) CR tablet 20 mEq  20 mEq Oral Daily Leandrew Koyanagi, MD   20 mEq at 07/23/17 0930  . rivaroxaban (XARELTO) tablet 20 mg  20 mg Oral Q supper Leandrew Koyanagi, MD      . sorbitol 70 % solution 30 mL  30 mL Oral Daily PRN Leandrew Koyanagi, MD         Discharge Medications: Please see discharge summary for a list of discharge medications.  Relevant Imaging Results:  Relevant Lab Results:   Additional Information SS#: [id number] [place], LCSW

## 2017-07-23 NOTE — Plan of Care (Signed)
  Progressing Education: Knowledge of General Education information will improve 07/23/2017 1107 - Progressing by Harlin Heys, RN Health Behavior/Discharge Planning: Ability to manage health-related needs will improve 07/23/2017 1107 - Progressing by Harlin Heys, RN Clinical Measurements: Ability to maintain clinical measurements within normal limits will improve 07/23/2017 1107 - Progressing by Harlin Heys, RN Will remain free from infection 07/23/2017 1107 - Progressing by Harlin Heys, RN Diagnostic test results will improve 07/23/2017 1107 - Progressing by Harlin Heys, RN Respiratory complications will improve 07/23/2017 1107 - Progressing by Harlin Heys, RN Cardiovascular complication will be avoided 07/23/2017 1107 - Progressing by Harlin Heys, RN Activity: Risk for activity intolerance will decrease 07/23/2017 1107 - Progressing by Harlin Heys, RN Nutrition: Adequate nutrition will be maintained 07/23/2017 1107 - Progressing by Harlin Heys, RN Coping: Level of anxiety will decrease 07/23/2017 1107 - Progressing by Harlin Heys, RN Elimination: Will not experience complications related to bowel motility 07/23/2017 1107 - Progressing by Harlin Heys, RN Will not experience complications related to urinary retention 07/23/2017 1107 - Progressing by Harlin Heys, RN Pain Managment: General experience of comfort will improve 07/23/2017 1107 - Progressing by Harlin Heys, RN Safety: Ability to remain free from injury will improve 07/23/2017 1107 - Progressing by Harlin Heys, RN Skin Integrity: Risk for impaired skin integrity will decrease 07/23/2017 1107 - Progressing by Harlin Heys, RN Education: Knowledge of the prescribed therapeutic regimen will improve 07/23/2017 1107 - Progressing by Harlin Heys, RN Activity: Ability to avoid complications of mobility impairment will improve 07/23/2017 1107 - Progressing by Harlin Heys, RN Range of joint motion will improve 07/23/2017 1107 - Progressing by Harlin Heys, RN Clinical Measurements: Postoperative complications will be avoided or minimized 07/23/2017 1107 - Progressing by Harlin Heys, RN Pain Management: Pain level will decrease with appropriate interventions 07/23/2017 1107 - Progressing by Harlin Heys, RN Skin Integrity: Signs of wound healing will improve 07/23/2017 1107 - Progressing by Harlin Heys, RN

## 2017-07-23 NOTE — Progress Notes (Signed)
Subjective: 1 Day Post-Op Procedure(s) (LRB): LEFT TOTAL KNEE ARTHROPLASTY (Left) Patient reports pain as mild.  Had issues overnight not urinating, but bladder scan did not detect a large volume.  Has now voided without issues.  Objective: Vital signs in last 24 hours: Temp:  [97.5 F (36.4 C)-98.7 F (37.1 C)] 97.5 F (36.4 C) (01/25 0508) Pulse Rate:  [55-85] 55 (01/25 0508) Resp:  [14-21] 18 (01/25 0508) BP: (99-155)/(56-77) 122/63 (01/25 0508) SpO2:  [93 %-99 %] 97 % (01/25 0508) Weight:  [232 lb (105.2 kg)] 232 lb (105.2 kg) (01/24 1315)  Intake/Output from previous day: 01/24 0701 - 01/25 0700 In: 2805 [P.O.:480; I.V.:2325] Out: 750 [Urine:700; Blood:50] Intake/Output this shift: No intake/output data recorded.  Recent Labs    07/23/17 0412  HGB 11.3*   Recent Labs    07/23/17 0412  WBC 14.0*  RBC 3.93*  HCT 36.3*  PLT 267   Recent Labs    07/23/17 0412  NA 135  K 4.8  CL 103  CO2 22  BUN 16  CREATININE 0.81  GLUCOSE 137*  CALCIUM 8.6*   No results for input(s): LABPT, INR in the last 72 hours.  Neurologically intact Neurovascular intact Sensation intact distally Intact pulses distally Dorsiflexion/Plantar flexion intact Incision: dressing C/D/I No cellulitis present Compartment soft  Assessment/Plan: 1 Day Post-Op Procedure(s) (LRB): LEFT TOTAL KNEE ARTHROPLASTY (Left) Advance diet Up with therapy Discharge to SNF once approved by insurance (likely Sunday) ABLA-mild and stable No need to reorder lasix (home meds) at this time WBAT LLE Will plan on dry dressing change sat/sun unless needed prior.  Do not apply tape as he is allergic  Aundra Dubin 07/23/2017, 7:30 AM

## 2017-07-23 NOTE — Social Work (Signed)
CSW discussed SNF offers with patient. Pt accepted SNF bed at [facility].  Pt will need to meet medicare qualifying night stay and will be ready for SNF on Sunday.  CSW will follow for disposition.  Elissa Hefty, LCSW Clinical Social Worker [phone]

## 2017-07-23 NOTE — Progress Notes (Signed)
Orthopedic Tech Progress Note Patient Details:  Brian [name] December 23, 1950 [id number]  Patient ID: Brian [name], male   DOB: April 01, 1951, 67 y.o.   MRN: [id number]   [name] 07/23/2017, 12:44 PM Placed pt's lle on cpm @0 -50 degrees @1245 ; RN notified

## 2017-07-23 NOTE — Evaluation (Signed)
Physical Therapy Evaluation Patient Details Name: Brian [name] MRN: [id number] DOB: [birth year] Today's Date: 07/23/2017   History of Present Illness  Brian [name] is a 67 y.o. male who presents for surgical treatment of left knee degenerative joint disease. Pt s/p L TKR  Clinical Impression  Pt presents with dependencies in mobility secondary to L TKR. Pt would benefit from skilled PT to maximize mobility and Independence for the next venue of care. Pt reports his roommate is unable to provide any assistance. Pt is currently min guard assist with mobility. Pt educated on exercise program which he returned demonstrated with supervision.    Follow Up Recommendations DC plan and follow up therapy as arranged by surgeon    Equipment Recommendations  None recommended by PT    Recommendations for Other Services       Precautions / Restrictions Restrictions LLE Weight Bearing: Weight bearing as tolerated      Mobility  Bed Mobility Overal bed mobility: Needs Assistance Bed Mobility: Supine to Sit;Sit to Supine     Supine to sit: Supervision Sit to supine: Supervision   General bed mobility comments: cues for technique for increased independence, use of bed rails and trapeze bar to assist with scotting up in bed and bed linen management  Transfers Overall transfer level: Needs assistance Equipment used: Rolling walker (2 wheeled) Transfers: Sit to/from Stand Sit to Stand: Min guard         General transfer comment: cues for hand placement  Ambulation/Gait Ambulation/Gait assistance: Min guard Ambulation Distance (Feet): 175 Feet Assistive device: Rolling walker (2 wheeled) Gait Pattern/deviations: Step-through pattern;Decreased weight shift to left Gait velocity: decreased Gait velocity interpretation: Below normal speed for age/gender General Gait Details: cues for walker positioning for improved safety  Stairs            Wheelchair Mobility     Modified Rankin (Stroke Patients Only)       Balance Overall balance assessment: Needs assistance Sitting-balance support: No upper extremity supported Sitting balance-Leahy Scale: Good     Standing balance support: Bilateral upper extremity supported Standing balance-Leahy Scale: Fair                               Pertinent Vitals/Pain Pain Assessment: 0-10 Pain Score: 4  Pain Location: Left knee Pain Descriptors / Indicators: Discomfort Pain Intervention(s): Limited activity within patient's tolerance;Monitored during session    Home Living Family/patient expects to be discharged to:: Skilled nursing facility Living Arrangements: Non-relatives/Friends   Type of Home: House Home Access: Stairs to enter Entrance Stairs-Rails: None Entrance Stairs-Number of Steps: 2 Home Layout: One level Home Equipment: Cane - single point;Walker - 2 wheels;Bedside commode Additional Comments: Pt reports his roomate has been assisting with bathing and toileting due to knee pain and RA    Prior Function Level of Independence: Needs assistance   Gait / Transfers Assistance Needed: mod Indep with RW           Hand Dominance        Extremity/Trunk Assessment   Upper Extremity Assessment Upper Extremity Assessment: Defer to OT evaluation    Lower Extremity Assessment Lower Extremity Assessment: LLE deficits/detail LLE Deficits / Details: knee 3/5    Cervical / Trunk Assessment Cervical / Trunk Assessment: Normal  Communication   Communication: No difficulties  Cognition Arousal/Alertness: Awake/alert   Overall Cognitive Status: Within Functional Limits for tasks assessed  General Comments      Exercises Total Joint Exercises Ankle Circles/Pumps: AAROM;Strengthening;Both;Supine;20 reps Quad Sets: AROM;Strengthening;Left;Supine;10 reps Hip ABduction/ADduction: AROM;Strengthening;Left;Supine;10  reps Straight Leg Raises: AROM;Strengthening;Left;10 reps Long Arc Quad: AROM;Strengthening;Left;10 reps;Seated Knee Flexion: AROM;Strengthening;Right;10 reps Goniometric ROM: 90 degrees   Assessment/Plan    PT Assessment Patient needs continued PT services  PT Problem List Decreased strength;Decreased mobility;Decreased safety awareness;Decreased range of motion;Decreased knowledge of precautions;Decreased activity tolerance;Decreased balance;Decreased knowledge of use of DME;Pain       PT Treatment Interventions DME instruction;Therapeutic activities;Gait training;Therapeutic exercise;Patient/family education;Balance training;Functional mobility training;Stair training    PT Goals (Current goals can be found in the Care Plan section)  Acute Rehab PT Goals Patient Stated Goal: To be independent again and d/c home after short SNF stay. PT Goal Formulation: With patient Time For Goal Achievement: 07/30/17 Potential to Achieve Goals: Good    Frequency 7X/week   Barriers to discharge        Co-evaluation               AM-PAC PT "6 Clicks" Daily Activity  Outcome Measure Difficulty turning over in bed (including adjusting bedclothes, sheets and blankets)?: A Little Difficulty moving from lying on back to sitting on the side of the bed? : A Little Difficulty sitting down on and standing up from a chair with arms (e.g., wheelchair, bedside commode, etc,.)?: A Little Help needed moving to and from a bed to chair (including a wheelchair)?: A Lot Help needed walking in hospital room?: A Lot Help needed climbing 3-5 steps with a railing? : A Lot 6 Click Score: 15    End of Session Equipment Utilized During Treatment: Gait belt Activity Tolerance: Patient tolerated treatment well Patient left: in bed;with call bell/phone within reach;with family/visitor present Nurse Communication: Mobility status PT Visit Diagnosis: Pain;Muscle weakness (generalized) (M62.81);Unsteadiness on  feet (R26.81) Pain - Right/Left: Left Pain - part of body: Knee    Time: 0815-0858 PT Time Calculation (min) (ACUTE ONLY): 43 min   Charges:   PT Evaluation $PT Eval Moderate Complexity: 1 Mod PT Treatments $Gait Training: 8-22 mins $Therapeutic Exercise: 8-22 mins   PT G Codes:        Theodoro Grist, PT  Lelon Mast 07/23/2017, 9:03 AM

## 2017-07-23 NOTE — Anesthesia Postprocedure Evaluation (Signed)
Anesthesia Post Note  Patient: Brian [name]  Procedure(s) Performed: LEFT TOTAL KNEE ARTHROPLASTY (Left Knee)     Patient location during evaluation: PACU Anesthesia Type: Spinal Level of consciousness: oriented, awake and alert and awake Pain management: pain level controlled Vital Signs Assessment: post-procedure vital signs reviewed and stable Respiratory status: spontaneous breathing, respiratory function stable and nonlabored ventilation Cardiovascular status: blood pressure returned to baseline and stable Postop Assessment: no headache, no backache, no apparent nausea or vomiting, spinal receding and patient able to bend at knees Anesthetic complications: no    Last Vitals:  Vitals:   07/23/17 0016 07/23/17 0508  BP: 108/61 122/63  Pulse: 68 (!) 55  Resp: 17 18  Temp: 36.6 C (!) 36.4 C  SpO2: 98% 97%    Last Pain:  Vitals:   07/23/17 0645  TempSrc:   PainSc: 2                  Catalina Gravel

## 2017-07-24 NOTE — Progress Notes (Signed)
Physical Therapy Treatment Patient Details Name: Brian [name] MRN: [id number] DOB: [birth year] Today's Date: 07/24/2017    History of Present Illness Brian [name] is a 67 y.o. male who presents for surgical treatment of left knee degenerative joint disease. Pt s/p L TKR    PT Comments    Patient did much better this 2nd session.  Still limited by pain, but once distracted (roommate entered room and was talking with patient) he was able to go further.  Knee flexion and strength are progressing nicely.  Agree with short-term SNF stay at discharge.    Follow Up Recommendations  DC plan and follow up therapy as arranged by surgeon     Equipment Recommendations  Rolling walker with 5" wheels    Recommendations for Other Services       Precautions / Restrictions Precautions Precautions: Fall;Knee Restrictions LLE Weight Bearing: Weight bearing as tolerated    Mobility  Bed Mobility Overal bed mobility: Needs Assistance Bed Mobility: Sit to Supine     Supine to sit: Min assist Sit to supine: Min assist   General bed mobility comments: assist for LE's  Transfers Overall transfer level: Needs assistance Equipment used: Rolling walker (2 wheeled) Transfers: Sit to/from Stand Sit to Stand: Min assist         General transfer comment: needed assist to power up from chair (lower height than bed)  Ambulation/Gait Ambulation/Gait assistance: Min guard Ambulation Distance (Feet): 20 Feet Assistive device: Rolling walker (2 wheeled) Gait Pattern/deviations: Step-to pattern;Decreased weight shift to left Gait velocity: decreased   General Gait Details: was able to ambulate to the door once distracted from knee pain   Stairs            Wheelchair Mobility    Modified Rankin (Stroke Patients Only)       Balance Overall balance assessment: Needs assistance Sitting-balance support: No upper extremity supported;Feet supported Sitting balance-Leahy Scale:  Good     Standing balance support: Bilateral upper extremity supported;During functional activity Standing balance-Leahy Scale: Poor Standing balance comment: reliant on RW                            Cognition Arousal/Alertness: Awake/alert Behavior During Therapy: WFL for tasks assessed/performed Overall Cognitive Status: Within Functional Limits for tasks assessed                                 General Comments: very anxious regarding therapy; hollering out throughout treatment      Exercises Total Joint Exercises Ankle Circles/Pumps: AROM;Both;10 reps;Supine Quad Sets: AROM;Both;10 reps;Supine Gluteal Sets: AROM;Both;10 reps;Supine Short Arc Quad: AAROM;Left;10 reps;Supine Heel Slides: AAROM;Left;10 reps;Supine Hip ABduction/ADduction: AAROM;Left;10 reps;Supine Straight Leg Raises: AAROM;Left;10 reps;Supine Long Arc Quad: AROM;Left;10 reps;Seated Knee Flexion: AAROM;Left;10 reps;Seated Goniometric ROM: 80 degrees    General Comments        Pertinent Vitals/Pain Pain Assessment: 0-10 Pain Score: 10-Worst pain ever Pain Location: Left knee Pain Descriptors / Indicators: Discomfort;Sore Pain Intervention(s): Limited activity within patient's tolerance;Monitored during session    Home Living                      Prior Function            PT Goals (current goals can now be found in the care plan section) Progress towards PT goals: Progressing toward goals    Frequency  7X/week      PT Plan Current plan remains appropriate    Co-evaluation              AM-PAC PT "6 Clicks" Daily Activity  Outcome Measure  Difficulty turning over in bed (including adjusting bedclothes, sheets and blankets)?: A Little Difficulty moving from lying on back to sitting on the side of the bed? : A Little Difficulty sitting down on and standing up from a chair with arms (e.g., wheelchair, bedside commode, etc,.)?: A Lot Help needed  moving to and from a bed to chair (including a wheelchair)?: A Little Help needed walking in hospital room?: A Little Help needed climbing 3-5 steps with a railing? : A Lot 6 Click Score: 16    End of Session Equipment Utilized During Treatment: Gait belt Activity Tolerance: Patient limited by pain Patient left: in bed;with call bell/phone within reach;with family/visitor present   PT Visit Diagnosis: Pain;Muscle weakness (generalized) (M62.81);Unsteadiness on feet (R26.81) Pain - Right/Left: Left Pain - part of body: Knee     Time: 1694-5038 PT Time Calculation (min) (ACUTE ONLY): 23 min  Charges:  $Gait Training: 8-22 mins $Therapeutic Exercise: 8-22 mins                    G Codes:       08/07/2017 [facility], PT [phone]     Shanna Cisco 07-Aug-2017, 4:20 PM

## 2017-07-24 NOTE — Progress Notes (Signed)
     Subjective: 2 Days Post-Op Procedure(s) (LRB): LEFT TOTAL KNEE ARTHROPLASTY (Left) Awake, alert and oriented x 4. Plan to be discharged to [facility] Sunday, Left knee pain worsened yesterday probably due to  Local anesthetic wearing off.  Patient reports pain as marked.    Objective:   VITALS:  Temp:  [98 F (36.7 C)-98.6 F (37 C)] 98 F (36.7 C) (01/26 0300) Pulse Rate:  [55-70] 55 (01/26 0844) Resp:  [16-17] 17 (01/26 0300) BP: (114-123)/(53-60) 114/55 (01/26 0844) SpO2:  [95 %-98 %] 98 % (01/26 0300)  Neurologically intact ABD soft Neurovascular intact Sensation intact distally Intact pulses distally Dorsiflexion/Plantar flexion intact Incision: no drainage No cellulitis present Compartment soft TED stockings intact.    LABS Recent Labs    07/23/17 0412  HGB 11.3*  WBC 14.0*  PLT 267   Recent Labs    07/23/17 0412  NA 135  K 4.8  CL 103  CO2 22  BUN 16  CREATININE 0.81  GLUCOSE 137*   No results for input(s): LABPT, INR in the last 72 hours.   Assessment/Plan: 2 Days Post-Op Procedure(s) (LRB): LEFT TOTAL KNEE ARTHROPLASTY (Left)  Advance diet Up with therapy D/C IV fluids Discharge home with home health  Shannan Garfinkel 07/24/2017, 10:20 AMPatient ID: Brian [name], male   DOB: 02/22/1951, 67 y.o.   MRN: [id number]

## 2017-07-24 NOTE — Progress Notes (Signed)
Physical Therapy Treatment Patient Details Name: Brian [name] MRN: [id number] DOB: [birth year] Today's Date: 07/24/2017    History of Present Illness Brian [name] is a 67 y.o. male who presents for surgical treatment of left knee degenerative joint disease. Pt s/p L TKR    PT Comments    Patient did not do as well today.  Limited by increased pain in knee and low back.  Only able to get to chair.  Will see again this afternoon.   Follow Up Recommendations  DC plan and follow up therapy as arranged by surgeon     Equipment Recommendations  Rolling walker with 5" wheels    Recommendations for Other Services       Precautions / Restrictions Precautions Precautions: Fall Restrictions LLE Weight Bearing: Weight bearing as tolerated    Mobility  Bed Mobility Overal bed mobility: Needs Assistance Bed Mobility: Supine to Sit     Supine to sit: Min assist        Transfers Overall transfer level: Needs assistance Equipment used: Rolling walker (2 wheeled) Transfers: Sit to/from Stand Sit to Stand: Min assist         General transfer comment: cueing for proper hand placement  Ambulation/Gait Ambulation/Gait assistance: Min guard Ambulation Distance (Feet): 5 Feet Assistive device: Rolling walker (2 wheeled) Gait Pattern/deviations: Step-to pattern;Decreased weight shift to left Gait velocity: decreased   General Gait Details: unable to ambulate further than bed to chair today due to pain   Stairs            Wheelchair Mobility    Modified Rankin (Stroke Patients Only)       Balance Overall balance assessment: Needs assistance Sitting-balance support: No upper extremity supported;Feet supported Sitting balance-Leahy Scale: Good     Standing balance support: Bilateral upper extremity supported;During functional activity Standing balance-Leahy Scale: Poor Standing balance comment: reliant on RW                             Cognition Arousal/Alertness: Awake/alert Behavior During Therapy: Anxious Overall Cognitive Status: Within Functional Limits for tasks assessed                                 General Comments: very anxious regarding therapy; hollering out throughout treatment      Exercises Total Joint Exercises Ankle Circles/Pumps: AROM;Both;10 reps;Seated Quad Sets: AROM;Both;10 reps;Seated Long Arc Quad: AROM;Left;10 reps;Seated Knee Flexion: AAROM;Left;10 reps;Seated Goniometric ROM: 80 degrees    General Comments        Pertinent Vitals/Pain Pain Assessment: 0-10 Pain Score: 10-Worst pain ever Pain Location: Left knee Pain Descriptors / Indicators: Discomfort;Sore Pain Intervention(s): Limited activity within patient's tolerance;Monitored during session;Premedicated before session    Home Living                      Prior Function            PT Goals (current goals can now be found in the care plan section) Progress towards PT goals: Progressing toward goals    Frequency    7X/week      PT Plan Current plan remains appropriate    Co-evaluation              AM-PAC PT "6 Clicks" Daily Activity  Outcome Measure  Difficulty turning over in bed (including adjusting bedclothes, sheets and blankets)?: A Little  Difficulty moving from lying on back to sitting on the side of the bed? : A Little Difficulty sitting down on and standing up from a chair with arms (e.g., wheelchair, bedside commode, etc,.)?: A Little Help needed moving to and from a bed to chair (including a wheelchair)?: A Little Help needed walking in hospital room?: A Lot Help needed climbing 3-5 steps with a railing? : Total 6 Click Score: 15    End of Session Equipment Utilized During Treatment: Gait belt Activity Tolerance: Patient limited by pain Patient left: in chair;with call bell/phone within reach   PT Visit Diagnosis: Pain;Muscle weakness (generalized)  (M62.81);Unsteadiness on feet (R26.81) Pain - Right/Left: Left Pain - part of body: Knee     Time: 1412-1436 PT Time Calculation (min) (ACUTE ONLY): 24 min  Charges:  $Gait Training: 8-22 mins $Therapeutic Exercise: 8-22 mins                    G Codes:       Jul 28, 2017 [facility], PT [phone]     Shanna Cisco 2017-07-28, 3:39 PM

## 2017-07-24 NOTE — Progress Notes (Signed)
Subjective: 2 Days Post-Op Procedure(s) (LRB): LEFT TOTAL KNEE ARTHROPLASTY (Left) Patient reports pain as moderate.  No acute changes overnight.  Objective: Vital signs in last 24 hours: Temp:  [98 F (36.7 C)-98.6 F (37 C)] 98 F (36.7 C) (01/26 0300) Pulse Rate:  [55-70] 55 (01/26 0844) Resp:  [16-17] 17 (01/26 0300) BP: (114-123)/(53-60) 114/55 (01/26 0844) SpO2:  [95 %-98 %] 98 % (01/26 0300)  Intake/Output from previous day: 01/25 0701 - 01/26 0700 In: 720 [P.O.:720] Out: 1250 [Urine:1250] Intake/Output this shift: No intake/output data recorded.  Recent Labs    07/23/17 0412  HGB 11.3*   Recent Labs    07/23/17 0412  WBC 14.0*  RBC 3.93*  HCT 36.3*  PLT 267   Recent Labs    07/23/17 0412  NA 135  K 4.8  CL 103  CO2 22  BUN 16  CREATININE 0.81  GLUCOSE 137*  CALCIUM 8.6*   No results for input(s): LABPT, INR in the last 72 hours.  Sensation intact distally Intact pulses distally Dorsiflexion/Plantar flexion intact Incision: dressing C/D/I Compartment soft  Assessment/Plan: 2 Days Post-Op Procedure(s) (LRB): LEFT TOTAL KNEE ARTHROPLASTY (Left) Up with therapy Plan for discharge tomorrow Discharge to SNF - [facility] 07/24/2017, 9:09 AM

## 2017-07-25 DIAGNOSIS — G8911 Acute pain due to trauma: Secondary | ICD-10-CM | POA: Diagnosis not present

## 2017-07-25 DIAGNOSIS — D649 Anemia, unspecified: Secondary | ICD-10-CM | POA: Diagnosis not present

## 2017-07-25 DIAGNOSIS — M069 Rheumatoid arthritis, unspecified: Secondary | ICD-10-CM | POA: Diagnosis not present

## 2017-07-25 DIAGNOSIS — M109 Gout, unspecified: Secondary | ICD-10-CM | POA: Diagnosis not present

## 2017-07-25 DIAGNOSIS — Z23 Encounter for immunization: Secondary | ICD-10-CM | POA: Diagnosis not present

## 2017-07-25 DIAGNOSIS — I4892 Unspecified atrial flutter: Secondary | ICD-10-CM | POA: Diagnosis not present

## 2017-07-25 DIAGNOSIS — E669 Obesity, unspecified: Secondary | ICD-10-CM | POA: Diagnosis not present

## 2017-07-25 DIAGNOSIS — Z96652 Presence of left artificial knee joint: Secondary | ICD-10-CM | POA: Diagnosis not present

## 2017-07-25 DIAGNOSIS — I5032 Chronic diastolic (congestive) heart failure: Secondary | ICD-10-CM | POA: Diagnosis not present

## 2017-07-25 DIAGNOSIS — I10 Essential (primary) hypertension: Secondary | ICD-10-CM | POA: Diagnosis not present

## 2017-07-25 DIAGNOSIS — M1712 Unilateral primary osteoarthritis, left knee: Secondary | ICD-10-CM | POA: Diagnosis not present

## 2017-07-25 DIAGNOSIS — S8990XA Unspecified injury of unspecified lower leg, initial encounter: Secondary | ICD-10-CM | POA: Diagnosis not present

## 2017-07-25 DIAGNOSIS — I6930 Unspecified sequelae of cerebral infarction: Secondary | ICD-10-CM | POA: Diagnosis not present

## 2017-07-25 DIAGNOSIS — F419 Anxiety disorder, unspecified: Secondary | ICD-10-CM | POA: Diagnosis not present

## 2017-07-25 DIAGNOSIS — G459 Transient cerebral ischemic attack, unspecified: Secondary | ICD-10-CM | POA: Diagnosis not present

## 2017-07-25 DIAGNOSIS — Z471 Aftercare following joint replacement surgery: Secondary | ICD-10-CM | POA: Diagnosis not present

## 2017-07-25 DIAGNOSIS — M6281 Muscle weakness (generalized): Secondary | ICD-10-CM | POA: Diagnosis not present

## 2017-07-25 MED ORDER — OXYCODONE HCL 10 MG PO TABS: 10.0000 mg | ORAL_TABLET | ORAL | 0 refills | Status: DC | PRN

## 2017-07-25 MED ORDER — PROMETHAZINE HCL 25 MG PO TABS: 25.0000 mg | ORAL_TABLET | Freq: Three times a day (TID) | ORAL | 1 refills | Status: DC | PRN

## 2017-07-25 MED ORDER — FAMOTIDINE 20 MG PO TABS: 20.0000 mg | ORAL_TABLET | Freq: Every day | ORAL | 1 refills | Status: DC

## 2017-07-25 MED ORDER — RIVAROXABAN 20 MG PO TABS: 20.0000 mg | ORAL_TABLET | Freq: Every day | ORAL | 5 refills | Status: DC

## 2017-07-25 MED ORDER — OXYCODONE HCL ER 15 MG PO T12A: 15.0000 mg | EXTENDED_RELEASE_TABLET | Freq: Two times a day (BID) | ORAL | 0 refills | Status: DC

## 2017-07-25 MED ORDER — METHOCARBAMOL 500 MG PO TABS: 500.0000 mg | ORAL_TABLET | Freq: Three times a day (TID) | ORAL | 1 refills | Status: DC | PRN

## 2017-07-25 NOTE — Progress Notes (Signed)
Physical Therapy Treatment Patient Details Name: Brian [name] MRN: [id number] DOB: 09/13/1950 Today's Date: 07/25/2017    History of Present Illness Brian [name] is a 67 y.o. male who presents for surgical treatment of left knee degenerative joint disease. Pt s/p L TKR    PT Comments    Continuing work on functional mobility and activity tolerance;  Able to incr distance ambulated and participate in therex despite pain; responds well to encouragement; slow, but steady and notable progress; Anticipate continued progress at SNF   Follow Up Recommendations  DC plan and follow up therapy as arranged by surgeon     Equipment Recommendations  Rolling walker with 5" wheels    Recommendations for Other Services       Precautions / Restrictions Precautions Precautions: Fall;Knee Restrictions LLE Weight Bearing: Weight bearing as tolerated    Mobility  Bed Mobility Overal bed mobility: Needs Assistance Bed Mobility: Supine to Sit     Supine to sit: Min guard     General bed mobility comments: Cues for technqiue; heavy use of bed rails  Transfers Overall transfer level: Needs assistance Equipment used: Rolling walker (2 wheeled) Transfers: Sit to/from Stand Sit to Stand: Min assist         General transfer comment: Assist to power up from lower bed; cues for hand placement  Ambulation/Gait Ambulation/Gait assistance: Min guard Ambulation Distance (Feet): 60 Feet Assistive device: Rolling walker (2 wheeled) Gait Pattern/deviations: Step-to pattern;Decreased weight shift to left Gait velocity: decreased   General Gait Details: Cues and encouragement to incr amb distance; no knee buckling noted   Stairs            Wheelchair Mobility    Modified Rankin (Stroke Patients Only)       Balance     Sitting balance-Leahy Scale: Good       Standing balance-Leahy Scale: Poor                              Cognition Arousal/Alertness:  Awake/alert Behavior During Therapy: WFL for tasks assessed/performed Overall Cognitive Status: Within Functional Limits for tasks assessed                                        Exercises Total Joint Exercises Ankle Circles/Pumps: AROM;Both;10 reps;Supine Quad Sets: AROM;Both;10 reps;Supine Heel Slides: AAROM;Left;10 reps;Supine Hip ABduction/ADduction: AAROM;Left;10 reps;Supine Straight Leg Raises: AAROM;Left;10 reps;Supine Goniometric ROM: approx 5-75deg    General Comments        Pertinent Vitals/Pain Pain Assessment: 0-10 Pain Score: 8  Pain Location: Left knee Pain Descriptors / Indicators: Discomfort;Sore Pain Intervention(s): Monitored during session;Repositioned    Home Living                      Prior Function            PT Goals (current goals can now be found in the care plan section) Acute Rehab PT Goals Patient Stated Goal: To be independent again and d/c home after short SNF stay. PT Goal Formulation: With patient Time For Goal Achievement: 07/30/17 Potential to Achieve Goals: Good Progress towards PT goals: Progressing toward goals    Frequency    7X/week      PT Plan Current plan remains appropriate    Co-evaluation  AM-PAC PT "6 Clicks" Daily Activity  Outcome Measure  Difficulty turning over in bed (including adjusting bedclothes, sheets and blankets)?: A Little Difficulty moving from lying on back to sitting on the side of the bed? : A Little Difficulty sitting down on and standing up from a chair with arms (e.g., wheelchair, bedside commode, etc,.)?: A Lot Help needed moving to and from a bed to chair (including a wheelchair)?: A Little Help needed walking in hospital room?: A Little Help needed climbing 3-5 steps with a railing? : A Lot 6 Click Score: 16    End of Session Equipment Utilized During Treatment: Gait belt Activity Tolerance: Patient limited by pain;Patient tolerated  treatment well Patient left: in chair;with call bell/phone within reach;with family/visitor present Nurse Communication: Mobility status PT Visit Diagnosis: Pain;Muscle weakness (generalized) (M62.81);Unsteadiness on feet (R26.81) Pain - Right/Left: Left Pain - part of body: Knee     Time: 0828-0900 PT Time Calculation (min) (ACUTE ONLY): 32 min  Charges:  $Gait Training: 8-22 mins $Therapeutic Exercise: 8-22 mins                    G Codes:       Roney Marion, PT  Acute Rehabilitation Services Pager [phone] Office [place] 07/25/2017, 1:13 PM

## 2017-07-25 NOTE — Progress Notes (Signed)
Patient is transferred to [facility] via [place].  Report was given to the Nurse Mable Fill)  Written and verbal report provided.

## 2017-07-25 NOTE — Progress Notes (Signed)
     Subjective: 3 Days Post-Op Procedure(s) (LRB): LEFT TOTAL KNEE ARTHROPLASTY (Left) Awake, alert and oriented x 4. The left knee has come alive. Local anesthetic has likely worn off. Voiding without difficulty. Standing and walking in the hallway. On xarelto.  Patient reports pain as marked.    Objective:   VITALS:  Temp:  [97.7 F (36.5 C)-98.4 F (36.9 C)] 97.9 F (36.6 C) (01/27 0445) Pulse Rate:  [60-71] 60 (01/27 0923) Resp:  [16-17] 17 (01/27 0445) BP: (114-132)/(56-58) 114/56 (01/27 0923) SpO2:  [96 %-98 %] 98 % (01/27 0445)  Neurologically intact ABD soft Neurovascular intact Sensation intact distally Intact pulses distally Dorsiflexion/Plantar flexion intact Incision: dressing C/D/I and no drainage No cellulitis present Compartment soft   LABS Recent Labs    07/23/17 0412  HGB 11.3*  WBC 14.0*  PLT 267   Recent Labs    07/23/17 0412  NA 135  K 4.8  CL 103  CO2 22  BUN 16  CREATININE 0.81  GLUCOSE 137*   No results for input(s): LABPT, INR in the last 72 hours.   Assessment/Plan: 3 Days Post-Op Procedure(s) (LRB): LEFT TOTAL KNEE ARTHROPLASTY (Left)  Advance diet Up with therapy Discharge to SNF  Virlan Kempker 07/25/2017, 11:20 AMPatient ID: Brian [name], male   DOB: 1951-02-26, 67 y.o.   MRN: [id number]

## 2017-07-25 NOTE — Clinical Social Work Placement (Signed)
   CLINICAL SOCIAL WORK PLACEMENT  NOTE  Date:  07/25/2017  Patient Details  Name: Brian [name] MRN: [id number] Date of Birth: 1951-06-11  Clinical Social Work is seeking post-discharge placement for this patient at the [place] level of care (*CSW will initial, date and re-position this form in  chart as items are completed):  Yes   Patient/family provided with [place] Work Department's list of facilities offering this level of care within the geographic area requested by the patient (or if unable, by the patient's family).  Yes   Patient/family informed of their freedom to choose among providers that offer the needed level of care, that participate in Medicare, Medicaid or managed care program needed by the patient, have an available bed and are willing to accept the patient.  Yes   Patient/family informed of [facility]'s ownership interest in [facility] and [facility], as well as of the fact that they are under no obligation to receive care at these facilities.  PASRR submitted to EDS on       PASRR number received on 07/23/17     Existing PASRR number confirmed on       FL2 transmitted to all facilities in geographic area requested by pt/family on 07/23/17     FL2 transmitted to all facilities within larger geographic area on       Patient informed that his/her managed care company has contracts with or will negotiate with certain facilities, including the following:        Yes   Patient/family informed of bed offers received.  Patient chooses bed at [facility]     Physician recommends and patient chooses bed at      Patient to be transferred to [facility] on  .  Patient to be transferred to facility by [facility]     Patient family notified on 07/25/17 of transfer.  Name of family member notified:  pt responsible for self     PHYSICIAN       Additional Comment:     _______________________________________________ Eileen Stanford, LCSW 07/25/2017, 2:53 PM

## 2017-07-25 NOTE — Discharge Summary (Signed)
Physician Discharge Summary      Patient ID: Brian [name] MRN: [id number] DOB/AGE: 05-Aug-1950 67 y.o.  Admit date: 07/22/2017 Discharge date: 07/25/2017  Admission Diagnoses:  Active Problems:   Total knee replacement status   Discharge Diagnoses:  Same  Past Medical History:  Diagnosis Date  . Anemia   . Anxiety   . Atrial flutter ([place])    a. 08/2012  . CHF (congestive heart failure) ([place])   . Dysrhythmia   . History of gout   . History of kidney stones    passed  . Hypertension   . Obesity   . Pneumonia 09/2016  . Rheumatoid arthritis ([place])    "a little bit qwhere" (07/22/2017)  . Shortness of breath    witrh exterion  . Tobacco abuse     Surgeries: Procedure(s): LEFT TOTAL KNEE ARTHROPLASTY on 07/22/2017   Consultants:   Discharged Condition: Improved  Hospital Course: Brian [name] is an 67 y.o. male who was admitted 07/22/2017 with a chief complaint of No chief complaint on file. , and found to have a diagnosis of <principal problem not specified>.  He was brought to the operating room on 07/22/2017 and underwent the above named procedures.    He was given perioperative antibiotics:  Anti-infectives (From admission, onward)   Start     Dose/Rate Route Frequency Ordered Stop   07/23/17 0600  ceFAZolin (ANCEF) IVPB 2g/100 mL premix     2 g 200 mL/hr over 30 Minutes Intravenous On call to O.R. 07/22/17 1254 07/22/17 1435   07/23/17 0600  cephALEXin (KEFLEX) capsule 500 mg     500 mg Oral Every 6 hours 07/22/17 1832     07/23/17 0000  cephALEXin (KEFLEX) 500 MG capsule     500 mg Oral Every 6 hours 07/23/17 1539     07/22/17 2200  sulfamethoxazole-trimethoprim (BACTRIM DS,SEPTRA DS) 800-160 MG per tablet 1 tablet  Status:  Discontinued     1 tablet Oral Every 12 hours 07/22/17 1820 07/22/17 1832   07/22/17 2100  ceFAZolin (ANCEF) IVPB 2g/100 mL premix  Status:  Discontinued     2 g 200 mL/hr over 30 Minutes Intravenous Every 6 hours 07/22/17 1820  07/23/17 1035   07/22/17 1542  vancomycin (VANCOCIN) powder  Status:  Discontinued       As needed 07/22/17 1542 07/22/17 1657   07/22/17 1259  ceFAZolin (ANCEF) 2-4 GM/100ML-% IVPB    Comments:  Scronce, Trina   : cabinet override      07/22/17 1259 07/22/17 1435   07/22/17 0000  sulfamethoxazole-trimethoprim (BACTRIM DS,SEPTRA DS) 800-160 MG tablet  Status:  Discontinued     1 tablet Oral 2 times daily 07/22/17 1621 07/23/17     Recovered uneventfully in PACU and was transferred to [facility] floor [place] [address]. VSS, tolerated standing night of surgery and PT and OT initiated on POD #1. Dressings changed on  POD#1 and he was able to stand and ambulate in hallway. CPM in place ROM 0-60. POD #2 awake and alert noted the local anesthetic was wearing off. No drainage or sign of infection. VSS, voiding without difficulty. Continued to make good progress with PT, xarelto was restarted. Tolerating po narcotics and nourishment. POD#3 awake and alert, oriented x 4. He is showing slow transition to standing and walking but still able to use a walker for ambulation. Tolerating po narcotics but needed more again likely due to local anesthetic. He is  Making slow progress in PT  and OT and SNF placement was undertaken. He is scheduled to be discharged to [facility] for  Continued rehabiltation post left total knee replacement.  Dressing is dry without drainage on the day of his discharge. He is concerned able his limited function but this is not expected to improve except with PT And OT over days to weeks. His post op Hbg 11.3. He is reassured and all questions were answered. Discharge to SNF today with return with Dr. Erlinda Hong in 2 weeks.  He was given sequential compression devices, early ambulation, and chemoprophylaxis for DVT prophylaxis.  He benefited maximally from their hospital stay and there were no complications.    Recent vital signs:  Vitals:   07/25/17 0445 07/25/17 0923  BP: (!) 114/56 (!)  114/56  Pulse: 60 60  Resp: 17   Temp: 97.9 F (36.6 C)   SpO2: 98%     Recent laboratory studies:  Results for orders placed or performed during the hospital encounter of 07/22/17  CBC  Result Value Ref Range   WBC 14.0 (H) 4.0 - 10.5 K/uL   RBC 3.93 (L) 4.22 - 5.81 MIL/uL   Hemoglobin 11.3 (L) 13.0 - 17.0 g/dL   HCT 36.3 (L) 39.0 - 52.0 %   MCV 92.4 78.0 - 100.0 fL   MCH 28.8 26.0 - 34.0 pg   MCHC 31.1 30.0 - 36.0 g/dL   RDW 15.8 (H) 11.5 - 15.5 %   Platelets 267 150 - 400 K/uL  Basic metabolic panel  Result Value Ref Range   Sodium 135 135 - 145 mmol/L   Potassium 4.8 3.5 - 5.1 mmol/L   Chloride 103 101 - 111 mmol/L   CO2 22 22 - 32 mmol/L   Glucose, Bld 137 (H) 65 - 99 mg/dL   BUN 16 6 - 20 mg/dL   Creatinine, Ser 0.81 0.61 - 1.24 mg/dL   Calcium 8.6 (L) 8.9 - 10.3 mg/dL   GFR calc non Af Amer >60 >60 mL/min   GFR calc Af Amer >60 >60 mL/min   Anion gap 10 5 - 15    Discharge Medications:   Allergies as of 07/25/2017      Reactions   Tape Rash, Other (See Comments)   Paper tape is preferred, please!!      Medication List    STOP taking these medications   HYDROcodone-acetaminophen 5-325 MG tablet Commonly known as:  NORCO/VICODIN     TAKE these medications   cephALEXin 500 MG capsule Commonly known as:  KEFLEX Take 1 capsule (500 mg total) by mouth every 6 (six) hours.   docusate sodium 100 MG capsule Commonly known as:  COLACE Take 200 mg by mouth 2 (two) times daily.   famotidine 20 MG tablet Commonly known as:  PEPCID Take 1 tablet (20 mg total) by mouth at bedtime.   folic acid 1 MG tablet Commonly known as:  FOLVITE Take 1 mg by mouth daily.   furosemide 40 MG tablet Commonly known as:  LASIX Take 2 tablets (80 mg total) by mouth 2 (two) times daily.   HUMIRA 40 MG/0.4ML Pskt Generic drug:  Adalimumab Inject 40 mg into the skin every 14 (fourteen) days.   methocarbamol 500 MG tablet Commonly known as:  ROBAXIN Take 1 tablet (500 mg  total) by mouth every 8 (eight) hours as needed for muscle spasms.   methotrexate 2.5 MG tablet Commonly known as:  RHEUMATREX Take 20 mg by mouth once a week. 8 tabs on Friday  metoprolol succinate 25 MG 24 hr tablet Commonly known as:  TOPROL XL Take 1 tablet (25 mg total) 2 (two) times daily by mouth.   ondansetron 4 MG tablet Commonly known as:  ZOFRAN Take 1-2 tablets (4-8 mg total) by mouth every 8 (eight) hours as needed for nausea or vomiting.   oxyCODONE 5 MG immediate release tablet Commonly known as:  Oxy IR/ROXICODONE Take 1-3 tablets (5-15 mg total) by mouth every 4 (four) hours as needed.   oxyCODONE 15 mg 12 hr tablet Commonly known as:  OXYCONTIN Take 1 tablet (15 mg total) by mouth every 12 (twelve) hours.   Oxycodone HCl 10 MG Tabs Take 1 tablet (10 mg total) by mouth every 4 (four) hours as needed for severe pain ((score 7 to 10)).   potassium chloride SA 20 MEQ tablet Commonly known as:  K-DUR,KLOR-CON Take 1 tablet (20 mEq total) by mouth daily.   promethazine 25 MG tablet Commonly known as:  PHENERGAN Take 1 tablet (25 mg total) by mouth every 8 (eight) hours as needed for nausea.   ranitidine 150 MG tablet Commonly known as:  ZANTAC Take 150 mg by mouth daily.   rivaroxaban 20 MG Tabs tablet Commonly known as:  XARELTO Take 1 tablet (20 mg total) by mouth daily with supper.   senna-docusate 8.6-50 MG tablet Commonly known as:  SENOKOT S Take 1 tablet by mouth at bedtime as needed.            Durable Medical Equipment  (From admission, onward)        Start     Ordered   07/22/17 1821  DME Walker rolling  Once    Question:  Patient needs a walker to treat with the following condition  Answer:  Total knee replacement status   07/22/17 1820   07/22/17 1821  DME 3 n 1  Once     07/22/17 1820   07/22/17 1821  DME Bedside commode  Once    Question:  Patient needs a bedside commode to treat with the following condition  Answer:  Total knee  replacement status   07/22/17 1820      Diagnostic Studies: Dg Knee Left Port  Result Date: 07/22/2017 CLINICAL DATA:  Left knee arthroplasty.  Rheumatoid arthritis. EXAM: PORTABLE LEFT KNEE - 1-2 VIEW COMPARISON:  MRI 06/11/2017 FINDINGS: Total left knee arthroplasty is noted with patellar resurfacing and long stem tibial tray. No evidence of loosening or hardware failure. No significant joint effusion. Subtle mottled soft tissue gas like lucencies are seen of the anterior thigh, infrapatellar knee and proximal tibia likely related to recent surgical change. IMPRESSION: Subtle soft tissue emphysematous changes about the anterior aspect of the thigh, infrapatellar knee and proximal tibia. This is likely post surgical in etiology given lack of hardware on the MRI from 06/11/2017. Intact total knee arthroplasty without hardware failure. Electronically Signed   By: Ashley Royalty M.D.   On: 07/22/2017 22:24    Disposition: 01-Home or Self Care  Discharge Instructions    Call MD / Call 911   Complete by:  As directed    If you experience chest pain or shortness of breath, CALL 911 and be transported to the hospital emergency room.  If you develope a fever above 101 F, pus (white drainage) or increased drainage or redness at the wound, or calf pain, call your surgeon's office.   Constipation Prevention   Complete by:  As directed    Drink plenty of fluids.  Prune juice may be helpful.  You may use a stool softener, such as Colace (over the counter) 100 mg twice a day.  Use MiraLax (over the counter) for constipation as needed.   Diet - low sodium heart healthy   Complete by:  As directed    Discharge instructions   Complete by:  As directed    INSTRUCTIONS AFTER JOINT REPLACEMENT   Remove items at home which could result in a fall. This includes throw rugs or furniture in walking pathways ICE to the affected joint every three hours while awake for 30 minutes at a time, for at least the first 3-5  days, and then as needed for pain and swelling.  Continue to use ice for pain and swelling. You may notice swelling that will progress down to the foot and ankle.  This is normal after surgery.  Elevate your leg when you are not up walking on it.   Continue to use the breathing machine you got in the hospital (incentive spirometer) which will help keep your temperature down.  It is common for your temperature to cycle up and down following surgery, especially at night when you are not up moving around and exerting yourself.  The breathing machine keeps your lungs expanded and your temperature down.   DIET:  As you were doing prior to hospitalization, we recommend a well-balanced diet.  DRESSING / WOUND CARE / SHOWERING  You may change your surgical dressing 5 days after surgery.  Then change the dressing every day with sterile gauze.  Please use good hand washing techniques before changing the dressing.  Do not use any lotions or creams on the incision until instructed by your surgeon.  You may shower while you have the surgical dressing which is waterproof.  After removal of surgical dressing, you must cover the incision when showering.  ACTIVITY  Increase activity slowly as tolerated, but follow the weight bearing instructions below.   No driving for 6 weeks or until further direction given by your physician.  You cannot drive while taking narcotics.  No lifting or carrying greater than 10 lbs. until further directed by your surgeon. Avoid periods of inactivity such as sitting longer than an hour when not asleep. This helps prevent blood clots.  You may return to work once you are authorized by your doctor.     WEIGHT BEARING   Weight bearing as tolerated with assist device (walker, cane, etc) as directed, use it as long as suggested by your surgeon or therapist, typically at least 4-6 weeks.   EXERCISES  Results after joint replacement surgery are often greatly improved when you follow  the exercise, range of motion and muscle strengthening exercises prescribed by your doctor. Safety measures are also important to protect the joint from further injury. Any time any of these exercises cause you to have increased pain or swelling, decrease what you are doing until you are comfortable again and then slowly increase them. If you have problems or questions, call your caregiver or physical therapist for advice.   Rehabilitation is important following a joint replacement. After just a few days of immobilization, the muscles of the leg can become weakened and shrink (atrophy).  These exercises are designed to build up the tone and strength of the thigh and leg muscles and to improve motion. Often times heat used for twenty to thirty minutes before working out will loosen up your tissues and help with improving the range of motion but do not  use heat for the first two weeks following surgery (sometimes heat can increase post-operative swelling).   These exercises can be done on a training (exercise) mat, on the floor, on a table or on a bed. Use whatever works the best and is most comfortable for you.    Use music or television while you are exercising so that the exercises are a pleasant break in your day. This will make your life better with the exercises acting as a break in your routine that you can look forward to.   Perform all exercises about fifteen times, three times per day or as directed.  You should exercise both the operative leg and the other leg as well.  Exercises include:   Quad Sets - Tighten up the muscle on the front of the thigh (Quad) and hold for 5-10 seconds.   Straight Leg Raises - With your knee straight (if you were given a brace, keep it on), lift the leg to 60 degrees, hold for 3 seconds, and slowly lower the leg.  Perform this exercise against resistance later as your leg gets stronger.  Leg Slides: Lying on your back, slowly slide your foot toward your buttocks,  bending your knee up off the floor (only go as far as is comfortable). Then slowly slide your foot back down until your leg is flat on the floor again.  Angel Wings: Lying on your back spread your legs to the side as far apart as you can without causing discomfort.  Hamstring Strength:  Lying on your back, push your heel against the floor with your leg straight by tightening up the muscles of your buttocks.  Repeat, but this time bend your knee to a comfortable angle, and push your heel against the floor.  You may put a pillow under the heel to make it more comfortable if necessary.   A rehabilitation program following joint replacement surgery can speed recovery and prevent re-injury in the future due to weakened muscles. Contact your doctor or a physical therapist for more information on knee rehabilitation.    CONSTIPATION  Constipation is defined medically as fewer than three stools per week and severe constipation as less than one stool per week.  Even if you have a regular bowel pattern at home, your normal regimen is likely to be disrupted due to multiple reasons following surgery.  Combination of anesthesia, postoperative narcotics, change in appetite and fluid intake all can affect your bowels.   YOU MUST use at least one of the following options; they are listed in order of increasing strength to get the job done.  They are all available over the counter, and you may need to use some, POSSIBLY even all of these options:    Drink plenty of fluids (prune juice may be helpful) and high fiber foods Colace 100 mg by mouth twice a day  Senokot for constipation as directed and as needed Dulcolax (bisacodyl), take with full glass of water  Miralax (polyethylene glycol) once or twice a day as needed.  If you have tried all these things and are unable to have a bowel movement in the first 3-4 days after surgery call either your surgeon or your primary doctor.    If you experience loose stools or  diarrhea, hold the medications until you stool forms back up.  If your symptoms do not get better within 1 week or if they get worse, check with your doctor.  If you experience "the worst abdominal pain ever"  or develop nausea or vomiting, please contact the office immediately for further recommendations for treatment.   ITCHING:  If you experience itching with your medications, try taking only a single pain pill, or even half a pain pill at a time.  You can also use Benadryl over the counter for itching or also to help with sleep.   TED HOSE STOCKINGS:  Use stockings on both legs until for at least 2 weeks or as directed by physician office. They may be removed at night for sleeping.  MEDICATIONS:  See your medication summary on the "After Visit Summary" that nursing will review with you.  You may have some home medications which will be placed on hold until you complete the course of blood thinner medication.  It is important for you to complete the blood thinner medication as prescribed.  PRECAUTIONS:  If you experience chest pain or shortness of breath - call 911 immediately for transfer to the hospital emergency department.   If you develop a fever greater that 101 F, purulent drainage from wound, increased redness or drainage from wound, foul odor from the wound/dressing, or calf pain - CONTACT YOUR SURGEON.                                                   FOLLOW-UP APPOINTMENTS:  If you do not already have a post-op appointment, please call the office for an appointment to be seen by your surgeon.  Guidelines for how soon to be seen are listed in your "After Visit Summary", but are typically between 1-4 weeks after surgery.  OTHER INSTRUCTIONS:   Knee Replacement:  Do not place pillow under knee, focus on keeping the knee straight while resting. CPM instructions: 0-90 degrees, 2 hours in the morning, 2 hours in the afternoon, and 2 hours in the evening. Place foam block, curve side up under  heel at all times except when in CPM or when walking.  DO NOT modify, tear, cut, or change the foam block in any way.  MAKE SURE YOU:  Understand these instructions.  Get help right away if you are not doing well or get worse.    Thank you for letting us be a part of your medical care team.  It is a privilege we respect greatly.  We hope these instructions will help you stay on track for a fast and full recovery!    Information on my medicine - XARELTO (Rivaroxaban)  This medication education was reviewed with me or my healthcare representative as part of my discharge preparation.   Why was Xarelto prescribed for you? Xarelto was prescribed for you to reduce the risk of a blood clot forming that can cause a stroke if you have a medical condition called atrial fibrillation (a type of irregular heartbeat).  What do you need to know about xarelto ? Take your Xarelto ONCE DAILY at the same time every day with your evening meal. If you have difficulty swallowing the tablet whole, you may crush it and mix in applesauce just prior to taking your dose.  Take Xarelto exactly as prescribed by your doctor and DO NOT stop taking Xarelto without talking to the doctor who prescribed the medication.  Stopping without other stroke prevention medication to take the place of Xarelto may increase your risk of developing a clot that causes a  stroke.  Refill your prescription before you run out.  After discharge, you should have regular check-up appointments with your healthcare provider that is prescribing your Xarelto.  In the future your dose may need to be changed if your kidney function or weight changes by a significant amount.  What do you do if you miss a dose? If you are taking Xarelto ONCE DAILY and you miss a dose, take it as soon as you remember on the same day then continue your regularly scheduled once daily regimen the next day. Do not take two doses of Xarelto at the same time or on  the same day.   Important Safety Information A possible side effect of Xarelto is bleeding. You should call your healthcare provider right away if you experience any of the following: Bleeding from an injury or your nose that does not stop. Unusual colored urine (red or dark brown) or unusual colored stools (red or black). Unusual bruising for unknown reasons. A serious fall or if you hit your head (even if there is no bleeding).  Some medicines may interact with Xarelto and might increase your risk of bleeding while on Xarelto. To help avoid this, consult your healthcare provider or pharmacist prior to using any new prescription or non-prescription medications, including herbals, vitamins, non-steroidal anti-inflammatory drugs (NSAIDs) and supplements.  This website has more information on Xarelto: [email or web address]   Driving restrictions   Complete by:  As directed    No driving for 3 weeks   Increase activity slowly as tolerated   Complete by:  As directed    Lifting restrictions   Complete by:  As directed    No lifting for 8 weeks       Contact information for follow-up providers    Leandrew Koyanagi, MD Follow up in 2 week(s).   Specialty:  Orthopedic Surgery Why:  For suture removal, For wound re-check Contact information: [place] [phone] [phone]            Contact information for after-discharge care    Destination    [facility] Follow up.   Service:  Skilled Nursing Contact information: [place] [phone]                   Signed: Jerren Flinchbaugh 07/25/2017, 11:29 AM

## 2017-07-25 NOTE — Clinical Social Work Note (Signed)
Clinical Social Worker facilitated patient discharge including contacting patient family and facility to confirm patient discharge plans.  Clinical information faxed to facility and family agreeable with plan.  CSW arranged ambulance transport via [facility] to [facility].  RN to call [phone] for report prior to discharge.  Clinical Social Worker will sign off for now as social work intervention is no longer needed. Please consult [place] again if new need arises.  [place], [place]

## 2017-07-26 DIAGNOSIS — Z471 Aftercare following joint replacement surgery: Secondary | ICD-10-CM | POA: Diagnosis not present

## 2017-07-26 DIAGNOSIS — G8929 Other chronic pain: Secondary | ICD-10-CM | POA: Diagnosis not present

## 2017-07-26 DIAGNOSIS — I4891 Unspecified atrial fibrillation: Secondary | ICD-10-CM | POA: Diagnosis not present

## 2017-07-26 DIAGNOSIS — M6281 Muscle weakness (generalized): Secondary | ICD-10-CM | POA: Diagnosis not present

## 2017-07-26 DIAGNOSIS — M25551 Pain in right hip: Secondary | ICD-10-CM | POA: Diagnosis not present

## 2017-07-26 DIAGNOSIS — R278 Other lack of coordination: Secondary | ICD-10-CM | POA: Diagnosis not present

## 2017-07-26 DIAGNOSIS — R600 Localized edema: Secondary | ICD-10-CM | POA: Diagnosis not present

## 2017-07-26 DIAGNOSIS — M25562 Pain in left knee: Secondary | ICD-10-CM | POA: Diagnosis not present

## 2017-07-26 DIAGNOSIS — I4892 Unspecified atrial flutter: Secondary | ICD-10-CM | POA: Diagnosis not present

## 2017-07-26 DIAGNOSIS — I509 Heart failure, unspecified: Secondary | ICD-10-CM | POA: Diagnosis not present

## 2017-07-26 DIAGNOSIS — I10 Essential (primary) hypertension: Secondary | ICD-10-CM | POA: Diagnosis not present

## 2017-07-26 DIAGNOSIS — R2689 Other abnormalities of gait and mobility: Secondary | ICD-10-CM | POA: Diagnosis not present

## 2017-07-26 DIAGNOSIS — Z96652 Presence of left artificial knee joint: Secondary | ICD-10-CM | POA: Diagnosis not present

## 2017-07-26 DIAGNOSIS — M069 Rheumatoid arthritis, unspecified: Secondary | ICD-10-CM | POA: Diagnosis not present

## 2017-07-26 DIAGNOSIS — R2681 Unsteadiness on feet: Secondary | ICD-10-CM | POA: Diagnosis not present

## 2017-07-26 DIAGNOSIS — M25552 Pain in left hip: Secondary | ICD-10-CM | POA: Diagnosis not present

## 2017-07-26 DIAGNOSIS — M1712 Unilateral primary osteoarthritis, left knee: Secondary | ICD-10-CM | POA: Diagnosis not present

## 2017-07-26 DIAGNOSIS — F419 Anxiety disorder, unspecified: Secondary | ICD-10-CM | POA: Diagnosis not present

## 2017-07-27 DIAGNOSIS — M25551 Pain in right hip: Secondary | ICD-10-CM | POA: Diagnosis not present

## 2017-07-27 DIAGNOSIS — R2681 Unsteadiness on feet: Secondary | ICD-10-CM | POA: Diagnosis not present

## 2017-07-27 DIAGNOSIS — Z96652 Presence of left artificial knee joint: Secondary | ICD-10-CM | POA: Diagnosis not present

## 2017-07-27 DIAGNOSIS — I509 Heart failure, unspecified: Secondary | ICD-10-CM | POA: Diagnosis not present

## 2017-07-27 DIAGNOSIS — R600 Localized edema: Secondary | ICD-10-CM | POA: Diagnosis not present

## 2017-07-27 DIAGNOSIS — M25562 Pain in left knee: Secondary | ICD-10-CM | POA: Diagnosis not present

## 2017-07-27 DIAGNOSIS — I4891 Unspecified atrial fibrillation: Secondary | ICD-10-CM | POA: Diagnosis not present

## 2017-07-27 DIAGNOSIS — M069 Rheumatoid arthritis, unspecified: Secondary | ICD-10-CM | POA: Diagnosis not present

## 2017-07-27 DIAGNOSIS — M25552 Pain in left hip: Secondary | ICD-10-CM | POA: Diagnosis not present

## 2017-07-27 DIAGNOSIS — G8929 Other chronic pain: Secondary | ICD-10-CM | POA: Diagnosis not present

## 2017-07-27 DIAGNOSIS — M6281 Muscle weakness (generalized): Secondary | ICD-10-CM | POA: Diagnosis not present

## 2017-07-28 DIAGNOSIS — Z96652 Presence of left artificial knee joint: Secondary | ICD-10-CM | POA: Diagnosis not present

## 2017-07-28 DIAGNOSIS — M25562 Pain in left knee: Secondary | ICD-10-CM | POA: Diagnosis not present

## 2017-07-28 DIAGNOSIS — F419 Anxiety disorder, unspecified: Secondary | ICD-10-CM | POA: Diagnosis not present

## 2017-07-28 DIAGNOSIS — R600 Localized edema: Secondary | ICD-10-CM | POA: Diagnosis not present

## 2017-07-28 DIAGNOSIS — M25552 Pain in left hip: Secondary | ICD-10-CM | POA: Diagnosis not present

## 2017-07-28 DIAGNOSIS — G8929 Other chronic pain: Secondary | ICD-10-CM | POA: Diagnosis not present

## 2017-07-28 DIAGNOSIS — R2681 Unsteadiness on feet: Secondary | ICD-10-CM | POA: Diagnosis not present

## 2017-07-28 DIAGNOSIS — M6281 Muscle weakness (generalized): Secondary | ICD-10-CM | POA: Diagnosis not present

## 2017-07-28 DIAGNOSIS — M25551 Pain in right hip: Secondary | ICD-10-CM | POA: Diagnosis not present

## 2017-07-28 DIAGNOSIS — M069 Rheumatoid arthritis, unspecified: Secondary | ICD-10-CM | POA: Diagnosis not present

## 2017-07-29 DIAGNOSIS — M25551 Pain in right hip: Secondary | ICD-10-CM | POA: Diagnosis not present

## 2017-07-29 DIAGNOSIS — G8929 Other chronic pain: Secondary | ICD-10-CM | POA: Diagnosis not present

## 2017-07-29 DIAGNOSIS — R2681 Unsteadiness on feet: Secondary | ICD-10-CM | POA: Diagnosis not present

## 2017-07-29 DIAGNOSIS — M6281 Muscle weakness (generalized): Secondary | ICD-10-CM | POA: Diagnosis not present

## 2017-07-29 DIAGNOSIS — M25552 Pain in left hip: Secondary | ICD-10-CM | POA: Diagnosis not present

## 2017-07-29 DIAGNOSIS — R600 Localized edema: Secondary | ICD-10-CM | POA: Diagnosis not present

## 2017-07-29 DIAGNOSIS — M069 Rheumatoid arthritis, unspecified: Secondary | ICD-10-CM | POA: Diagnosis not present

## 2017-07-29 DIAGNOSIS — M25562 Pain in left knee: Secondary | ICD-10-CM | POA: Diagnosis not present

## 2017-07-30 DIAGNOSIS — I509 Heart failure, unspecified: Secondary | ICD-10-CM | POA: Diagnosis not present

## 2017-08-02 ENCOUNTER — Encounter (HOSPITAL_COMMUNITY): Payer: Self-pay | Admitting: Orthopaedic Surgery

## 2017-08-02 DIAGNOSIS — M069 Rheumatoid arthritis, unspecified: Secondary | ICD-10-CM | POA: Diagnosis not present

## 2017-08-02 DIAGNOSIS — R600 Localized edema: Secondary | ICD-10-CM | POA: Diagnosis not present

## 2017-08-02 DIAGNOSIS — M25552 Pain in left hip: Secondary | ICD-10-CM | POA: Diagnosis not present

## 2017-08-02 DIAGNOSIS — G8929 Other chronic pain: Secondary | ICD-10-CM | POA: Diagnosis not present

## 2017-08-02 DIAGNOSIS — M6281 Muscle weakness (generalized): Secondary | ICD-10-CM | POA: Diagnosis not present

## 2017-08-02 DIAGNOSIS — M25551 Pain in right hip: Secondary | ICD-10-CM | POA: Diagnosis not present

## 2017-08-02 DIAGNOSIS — M25562 Pain in left knee: Secondary | ICD-10-CM | POA: Diagnosis not present

## 2017-08-02 DIAGNOSIS — Z96652 Presence of left artificial knee joint: Secondary | ICD-10-CM | POA: Diagnosis not present

## 2017-08-02 DIAGNOSIS — R2681 Unsteadiness on feet: Secondary | ICD-10-CM | POA: Diagnosis not present

## 2017-08-02 DIAGNOSIS — I509 Heart failure, unspecified: Secondary | ICD-10-CM | POA: Diagnosis not present

## 2017-08-05 ENCOUNTER — Encounter (INDEPENDENT_AMBULATORY_CARE_PROVIDER_SITE_OTHER): Payer: Self-pay | Admitting: Orthopaedic Surgery

## 2017-08-05 ENCOUNTER — Ambulatory Visit (INDEPENDENT_AMBULATORY_CARE_PROVIDER_SITE_OTHER): Payer: Medicare Other | Admitting: Orthopaedic Surgery

## 2017-08-05 DIAGNOSIS — M25551 Pain in right hip: Secondary | ICD-10-CM | POA: Diagnosis not present

## 2017-08-05 DIAGNOSIS — G8929 Other chronic pain: Secondary | ICD-10-CM | POA: Diagnosis not present

## 2017-08-05 DIAGNOSIS — M25562 Pain in left knee: Secondary | ICD-10-CM | POA: Diagnosis not present

## 2017-08-05 DIAGNOSIS — M25552 Pain in left hip: Secondary | ICD-10-CM | POA: Diagnosis not present

## 2017-08-05 DIAGNOSIS — M069 Rheumatoid arthritis, unspecified: Secondary | ICD-10-CM | POA: Diagnosis not present

## 2017-08-05 DIAGNOSIS — R600 Localized edema: Secondary | ICD-10-CM | POA: Diagnosis not present

## 2017-08-05 DIAGNOSIS — R2681 Unsteadiness on feet: Secondary | ICD-10-CM | POA: Diagnosis not present

## 2017-08-05 DIAGNOSIS — M6281 Muscle weakness (generalized): Secondary | ICD-10-CM | POA: Diagnosis not present

## 2017-08-05 DIAGNOSIS — Z96652 Presence of left artificial knee joint: Secondary | ICD-10-CM

## 2017-08-05 NOTE — Progress Notes (Signed)
Post-Op Visit Note   Patient: Brian [name]           Date of Birth: [birth year]           MRN: [id number] Visit Date: 08/05/2017 PCP: Orpah Melter, MD   Assessment & Plan:  Chief Complaint:  Chief Complaint  Patient presents with  . Left Knee - Pain   Visit Diagnoses:  1. S/P TKR (total knee replacement), left     Plan: Brian [name] comes in for follow-up.  14 days status post left total knee replacement date of surgery 07/22/2017.  Doing well.  No fevers chills or any other systemic symptoms.  Moderate pain but this is being controlled with Percocet.  He has been at [facility] working with PT.  Examination of his left lower extremity reveals moderate swelling throughout.  Well-healing surgical incision without evidence of infection.  Calf soft nontender.  At this point the patient would like to stay again in place for another 2 weeks to mobilize more with PT.  We will have him set up home health PT for when he goes from as he lives with a roommate who works every day.  He will follow-up with [place] in 4 weeks time for repeat evaluation and x-ray.  Follow-Up Instructions: Return in about 4 weeks (around 09/02/2017).   Orders:  No orders of the defined types were placed in this encounter.  No orders of the defined types were placed in this encounter.   Imaging: No results found.  PMFS History: Patient Active Problem List   Diagnosis Date Noted  . 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 Cellulitis 01/22/2017  . Polyarthritis 12/01/2016  . Cellulitis of left leg 11/29/2016  . Sepsis ([place]) 11/29/2016  . Effusion, left knee 11/13/2016  . Persistent atrial fibrillation ([place])   . Dyspnea   . Positive urine drug screen   . CAP (community acquired pneumonia) 09/30/2016  . Community acquired pneumonia of left lower lobe of lung ([place])   . Pleural effusion   . Tachypnea   . CVA (cerebral infarction) 09/27/2015  . Right hand pain 09/27/2015  . History of gout 09/27/2015  .  Cocaine abuse ([place]) 09/27/2015  . TIA (transient ischemic attack) 09/27/2015  . Slurred speech   . Primary osteoarthritis of right hand   . Leukocytosis   . Essential hypertension   . Chronic diastolic CHF (congestive heart failure) ([place])   . HTN (hypertension) 11/22/2013  . Chronic diastolic heart failure ([place]) 11/22/2013  . Noncompliance 11/17/2013  . Atrial flutter with rapid ventricular response ([place]) 11/16/2013  . Acute on chronic diastolic CHF (congestive heart failure) ([place]) 09/05/2012  . Tobacco abuse   . Obesity- BMI 35   . Diastolic CHF ([place])   . Atrial flutter ([place])   . Anxiety    Past Medical History:  Diagnosis Date  . Anemia   . Anxiety   . Atrial flutter ([place])    a. 08/2012  . CHF (congestive heart failure) ([place])   . Dysrhythmia   . History of gout   . History of kidney stones    passed  . Hypertension   . Obesity   . Pneumonia 09/2016  . Rheumatoid arthritis ([place])    "a little bit qwhere" (07/22/2017)  . Shortness of breath    witrh exterion  . Tobacco abuse     Family History  Problem Relation Age of Onset  . Cancer Mother   . Heart disease Father   . Heart  attack Father   . Cancer Father   . Diabetes Sister     Past Surgical History:  Procedure Laterality Date  . A-FLUTTER ABLATION N/A 12/29/2016   Procedure: A-Flutter Ablation;  Surgeon: [name], MD;  Location: [facility];  Service: Cardiovascular;  Laterality: N/A;  . CARDIOVERSION N/A 11/05/2016   Procedure: CARDIOVERSION;  Surgeon: Sueanne Margarita, MD;  Location: [facility];  Service: Cardiovascular;  Laterality: N/A;  . IR THORACENTESIS ASP PLEURAL SPACE W/IMG GUIDE  10/01/2016  . JOINT REPLACEMENT    . MICROLARYNGOSCOPY  09/02/2007   with excision of right vocal cord mass, Dr. Constance Holster  . TOTAL KNEE ARTHROPLASTY Left 07/22/2017  . TOTAL KNEE ARTHROPLASTY Left 07/22/2017   Procedure: LEFT TOTAL KNEE ARTHROPLASTY;  Surgeon: Leandrew Koyanagi, MD;  Location: [facility];  Service: Orthopedics;   Laterality: Left;   Social History   Occupational History  . Not on file  Tobacco Use  . Smoking status: Current Every Day Smoker    Packs/day: 0.13    Years: 45.00    Pack years: 5.85    Types: Cigarettes  . Smokeless tobacco: Never Used  . Tobacco comment: "quit off and on"  Substance and Sexual Activity  . Alcohol use: Yes    Comment: 07/22/2017 "nothing since 2016"  . Drug use: No    Comment: 07/22/2017 "none since 11/2016" cocaine (sister is not aware)  . Sexual activity: Not Currently

## 2017-08-06 DIAGNOSIS — R600 Localized edema: Secondary | ICD-10-CM | POA: Diagnosis not present

## 2017-08-06 DIAGNOSIS — M25552 Pain in left hip: Secondary | ICD-10-CM | POA: Diagnosis not present

## 2017-08-06 DIAGNOSIS — M6281 Muscle weakness (generalized): Secondary | ICD-10-CM | POA: Diagnosis not present

## 2017-08-06 DIAGNOSIS — M069 Rheumatoid arthritis, unspecified: Secondary | ICD-10-CM | POA: Diagnosis not present

## 2017-08-06 DIAGNOSIS — R2681 Unsteadiness on feet: Secondary | ICD-10-CM | POA: Diagnosis not present

## 2017-08-06 DIAGNOSIS — M25562 Pain in left knee: Secondary | ICD-10-CM | POA: Diagnosis not present

## 2017-08-06 DIAGNOSIS — M25551 Pain in right hip: Secondary | ICD-10-CM | POA: Diagnosis not present

## 2017-08-06 DIAGNOSIS — G8929 Other chronic pain: Secondary | ICD-10-CM | POA: Diagnosis not present

## 2017-08-09 DIAGNOSIS — M25552 Pain in left hip: Secondary | ICD-10-CM | POA: Diagnosis not present

## 2017-08-09 DIAGNOSIS — M25562 Pain in left knee: Secondary | ICD-10-CM | POA: Diagnosis not present

## 2017-08-09 DIAGNOSIS — R2681 Unsteadiness on feet: Secondary | ICD-10-CM | POA: Diagnosis not present

## 2017-08-09 DIAGNOSIS — M25551 Pain in right hip: Secondary | ICD-10-CM | POA: Diagnosis not present

## 2017-08-09 DIAGNOSIS — M069 Rheumatoid arthritis, unspecified: Secondary | ICD-10-CM | POA: Diagnosis not present

## 2017-08-09 DIAGNOSIS — M6281 Muscle weakness (generalized): Secondary | ICD-10-CM | POA: Diagnosis not present

## 2017-08-09 DIAGNOSIS — G8929 Other chronic pain: Secondary | ICD-10-CM | POA: Diagnosis not present

## 2017-08-09 DIAGNOSIS — R600 Localized edema: Secondary | ICD-10-CM | POA: Diagnosis not present

## 2017-08-12 DIAGNOSIS — M25562 Pain in left knee: Secondary | ICD-10-CM | POA: Diagnosis not present

## 2017-08-12 DIAGNOSIS — I509 Heart failure, unspecified: Secondary | ICD-10-CM | POA: Diagnosis not present

## 2017-08-12 DIAGNOSIS — G8929 Other chronic pain: Secondary | ICD-10-CM | POA: Diagnosis not present

## 2017-08-12 DIAGNOSIS — M069 Rheumatoid arthritis, unspecified: Secondary | ICD-10-CM | POA: Diagnosis not present

## 2017-08-12 DIAGNOSIS — R2681 Unsteadiness on feet: Secondary | ICD-10-CM | POA: Diagnosis not present

## 2017-08-12 DIAGNOSIS — Z96652 Presence of left artificial knee joint: Secondary | ICD-10-CM | POA: Diagnosis not present

## 2017-08-12 DIAGNOSIS — M6281 Muscle weakness (generalized): Secondary | ICD-10-CM | POA: Diagnosis not present

## 2017-08-12 DIAGNOSIS — R600 Localized edema: Secondary | ICD-10-CM | POA: Diagnosis not present

## 2017-08-12 DIAGNOSIS — M25551 Pain in right hip: Secondary | ICD-10-CM | POA: Diagnosis not present

## 2017-08-12 DIAGNOSIS — M25552 Pain in left hip: Secondary | ICD-10-CM | POA: Diagnosis not present

## 2017-08-12 DIAGNOSIS — I4891 Unspecified atrial fibrillation: Secondary | ICD-10-CM | POA: Diagnosis not present

## 2017-08-17 ENCOUNTER — Telehealth (INDEPENDENT_AMBULATORY_CARE_PROVIDER_SITE_OTHER): Payer: Self-pay | Admitting: Orthopaedic Surgery

## 2017-08-17 DIAGNOSIS — M19041 Primary osteoarthritis, right hand: Secondary | ICD-10-CM | POA: Diagnosis not present

## 2017-08-17 DIAGNOSIS — M069 Rheumatoid arthritis, unspecified: Secondary | ICD-10-CM | POA: Diagnosis not present

## 2017-08-17 DIAGNOSIS — M109 Gout, unspecified: Secondary | ICD-10-CM | POA: Diagnosis not present

## 2017-08-17 DIAGNOSIS — I5033 Acute on chronic diastolic (congestive) heart failure: Secondary | ICD-10-CM | POA: Diagnosis not present

## 2017-08-17 DIAGNOSIS — Z471 Aftercare following joint replacement surgery: Secondary | ICD-10-CM | POA: Diagnosis not present

## 2017-08-17 DIAGNOSIS — I110 Hypertensive heart disease with heart failure: Secondary | ICD-10-CM | POA: Diagnosis not present

## 2017-08-17 NOTE — Telephone Encounter (Signed)
Brian [name]-(PT) with [facility] called needing verbal orders for HHPT twice a week for 5 weeks. The number to contact Brian [name] is [phone]

## 2017-08-17 NOTE — Telephone Encounter (Signed)
Is this okay?

## 2017-08-18 NOTE — Telephone Encounter (Signed)
Called [place] no answer LMOM with details

## 2017-08-18 NOTE — Telephone Encounter (Signed)
Ok to do

## 2017-08-19 DIAGNOSIS — M19041 Primary osteoarthritis, right hand: Secondary | ICD-10-CM | POA: Diagnosis not present

## 2017-08-19 DIAGNOSIS — M069 Rheumatoid arthritis, unspecified: Secondary | ICD-10-CM | POA: Diagnosis not present

## 2017-08-19 DIAGNOSIS — I110 Hypertensive heart disease with heart failure: Secondary | ICD-10-CM | POA: Diagnosis not present

## 2017-08-19 DIAGNOSIS — M109 Gout, unspecified: Secondary | ICD-10-CM | POA: Diagnosis not present

## 2017-08-19 DIAGNOSIS — I5033 Acute on chronic diastolic (congestive) heart failure: Secondary | ICD-10-CM | POA: Diagnosis not present

## 2017-08-19 DIAGNOSIS — Z471 Aftercare following joint replacement surgery: Secondary | ICD-10-CM | POA: Diagnosis not present

## 2017-08-26 ENCOUNTER — Telehealth (INDEPENDENT_AMBULATORY_CARE_PROVIDER_SITE_OTHER): Payer: Self-pay | Admitting: Orthopaedic Surgery

## 2017-08-26 DIAGNOSIS — M069 Rheumatoid arthritis, unspecified: Secondary | ICD-10-CM | POA: Diagnosis not present

## 2017-08-26 DIAGNOSIS — I5033 Acute on chronic diastolic (congestive) heart failure: Secondary | ICD-10-CM | POA: Diagnosis not present

## 2017-08-26 DIAGNOSIS — I110 Hypertensive heart disease with heart failure: Secondary | ICD-10-CM | POA: Diagnosis not present

## 2017-08-26 DIAGNOSIS — Z471 Aftercare following joint replacement surgery: Secondary | ICD-10-CM | POA: Diagnosis not present

## 2017-08-26 DIAGNOSIS — M109 Gout, unspecified: Secondary | ICD-10-CM | POA: Diagnosis not present

## 2017-08-26 DIAGNOSIS — M19041 Primary osteoarthritis, right hand: Secondary | ICD-10-CM | POA: Diagnosis not present

## 2017-08-26 NOTE — Telephone Encounter (Signed)
Try again next week.

## 2017-08-26 NOTE — Telephone Encounter (Signed)
Brian [name] from [facility] called wanting to inform Dr. Erlinda Hong that she saw the patient for 3 visits but this past week he was unable to do any PT because of the intense bilateral hip pain, just wanted to let Dr. Erlinda Hong know and see what he would suggest she do. CB # [phone]

## 2017-08-26 NOTE — Telephone Encounter (Signed)
See message below °

## 2017-08-27 NOTE — Telephone Encounter (Signed)
Called [place] to advise

## 2017-08-30 DIAGNOSIS — I110 Hypertensive heart disease with heart failure: Secondary | ICD-10-CM | POA: Diagnosis not present

## 2017-08-30 DIAGNOSIS — I5033 Acute on chronic diastolic (congestive) heart failure: Secondary | ICD-10-CM | POA: Diagnosis not present

## 2017-08-30 DIAGNOSIS — M069 Rheumatoid arthritis, unspecified: Secondary | ICD-10-CM | POA: Diagnosis not present

## 2017-08-30 DIAGNOSIS — M109 Gout, unspecified: Secondary | ICD-10-CM | POA: Diagnosis not present

## 2017-08-30 DIAGNOSIS — M19041 Primary osteoarthritis, right hand: Secondary | ICD-10-CM | POA: Diagnosis not present

## 2017-08-30 DIAGNOSIS — Z471 Aftercare following joint replacement surgery: Secondary | ICD-10-CM | POA: Diagnosis not present

## 2017-09-02 ENCOUNTER — Encounter (INDEPENDENT_AMBULATORY_CARE_PROVIDER_SITE_OTHER): Payer: Self-pay | Admitting: Orthopaedic Surgery

## 2017-09-02 ENCOUNTER — Telehealth (INDEPENDENT_AMBULATORY_CARE_PROVIDER_SITE_OTHER): Payer: Self-pay | Admitting: *Deleted

## 2017-09-02 ENCOUNTER — Ambulatory Visit (INDEPENDENT_AMBULATORY_CARE_PROVIDER_SITE_OTHER): Payer: Medicare Other

## 2017-09-02 ENCOUNTER — Ambulatory Visit (INDEPENDENT_AMBULATORY_CARE_PROVIDER_SITE_OTHER): Payer: Medicare Other | Admitting: Orthopaedic Surgery

## 2017-09-02 DIAGNOSIS — I110 Hypertensive heart disease with heart failure: Secondary | ICD-10-CM | POA: Diagnosis not present

## 2017-09-02 DIAGNOSIS — I5033 Acute on chronic diastolic (congestive) heart failure: Secondary | ICD-10-CM | POA: Diagnosis not present

## 2017-09-02 DIAGNOSIS — M1612 Unilateral primary osteoarthritis, left hip: Secondary | ICD-10-CM

## 2017-09-02 DIAGNOSIS — Z96652 Presence of left artificial knee joint: Secondary | ICD-10-CM

## 2017-09-02 DIAGNOSIS — M1611 Unilateral primary osteoarthritis, right hip: Secondary | ICD-10-CM

## 2017-09-02 DIAGNOSIS — Z471 Aftercare following joint replacement surgery: Secondary | ICD-10-CM | POA: Diagnosis not present

## 2017-09-02 DIAGNOSIS — M069 Rheumatoid arthritis, unspecified: Secondary | ICD-10-CM | POA: Diagnosis not present

## 2017-09-02 DIAGNOSIS — M109 Gout, unspecified: Secondary | ICD-10-CM | POA: Diagnosis not present

## 2017-09-02 DIAGNOSIS — M19041 Primary osteoarthritis, right hand: Secondary | ICD-10-CM | POA: Diagnosis not present

## 2017-09-02 MED ORDER — TRAMADOL HCL 50 MG PO TABS: 50.0000 mg | ORAL_TABLET | Freq: Three times a day (TID) | ORAL | 2 refills | Status: DC | PRN

## 2017-09-02 MED ORDER — HYDROCODONE-ACETAMINOPHEN 5-325 MG PO TABS: 1.0000 | ORAL_TABLET | Freq: Every day | ORAL | 0 refills | Status: DC | PRN

## 2017-09-02 NOTE — Progress Notes (Signed)
Post-Op Visit Note   Patient: Brian [name]           Date of Birth: [birth year]           MRN: [id number] Visit Date: 09/02/2017 PCP: Orpah Melter, MD   Assessment & Plan:  Chief Complaint:  Chief Complaint  Patient presents with  . Left Knee - Pain   Visit Diagnoses:  1. Status post total left knee replacement   2. Primary osteoarthritis of right hip   3. Primary osteoarthritis of left hip     Plan: 6 week TKA follow up plan  Patient presents for follow up 6 weeks status post left total knee replacement. The wound is healed and there is no evidence of infection. TED hose may be discontinued. Radiographs reveal a total knee arthroplasty in good position, with no evidence of subsidence, loosening, or complicating features. Patient should refrain from any dental procedures, colonoscopies, etc until 3 months postop.  Reminders were given about signs to be aware of including redness, drainage, increased pain, fevers, calf pain, shortness of breath, or any concern should generate a phone call or a return to see [place] immediately. Will plan to follow up at 3 months postoperatively for next evaluation. Will discuss possible hip replacement at next visit if he has recovered from TKA enough.   Continue of out of work for now as patient still has gait abnormalities and weakness. Will set up with bilateral hip injections with Dr. Ernestina Patches or [facility] for severe OA and facilitation of left knee rehab  Follow-Up Instructions: Return in about 6 weeks (around 10/14/2017).   Orders:  Orders Placed This Encounter  Procedures  . XR KNEE 3 VIEW LEFT   Meds ordered this encounter  Medications  . HYDROcodone-acetaminophen (NORCO) 5-325 MG tablet    Sig: Take 1-2 tablets by mouth daily as needed.    Dispense:  30 tablet    Refill:  0  . traMADol (ULTRAM) 50 MG tablet    Sig: Take 1-2 tablets (50-100 mg total) by mouth 3 (three) times daily as needed.    Dispense:  30 tablet    Refill:  2      Imaging: No results found.  PMFS History: Patient Active Problem List   Diagnosis Date Noted  . 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 Cellulitis 01/22/2017  . Polyarthritis 12/01/2016  . Cellulitis of left leg 11/29/2016  . Sepsis ([place]) 11/29/2016  . Effusion, left knee 11/13/2016  . Persistent atrial fibrillation ([place])   . Dyspnea   . Positive urine drug screen   . CAP (community acquired pneumonia) 09/30/2016  . Community acquired pneumonia of left lower lobe of lung ([place])   . Pleural effusion   . Tachypnea   . CVA (cerebral infarction) 09/27/2015  . Right hand pain 09/27/2015  . History of gout 09/27/2015  . Cocaine abuse ([place]) 09/27/2015  . TIA (transient ischemic attack) 09/27/2015  . Slurred speech   . Primary osteoarthritis of right hand   . Leukocytosis   . Essential hypertension   . Chronic diastolic CHF (congestive heart failure) ([place])   . HTN (hypertension) 11/22/2013  . Chronic diastolic heart failure ([place]) 11/22/2013  . Noncompliance 11/17/2013  . Atrial flutter with rapid ventricular response ([place]) 11/16/2013  . Acute on chronic diastolic CHF (congestive heart failure) ([place]) 09/05/2012  . Tobacco abuse   . Obesity- BMI 35   . Diastolic CHF ([place])   . Atrial flutter ([place])   . Anxiety  Past Medical History:  Diagnosis Date  . Anemia   . Anxiety   . Atrial flutter ([place])    a. 08/2012  . CHF (congestive heart failure) ([place])   . Dysrhythmia   . History of gout   . History of kidney stones    passed  . Hypertension   . Obesity   . Pneumonia 09/2016  . Rheumatoid arthritis ([place])    "a little bit qwhere" (07/22/2017)  . Shortness of breath    witrh exterion  . Tobacco abuse     Family History  Problem Relation Age of Onset  . Cancer Mother   . Heart disease Father   . Heart attack Father   . Cancer Father   . Diabetes Sister     Past Surgical History:  Procedure Laterality Date  . A-FLUTTER ABLATION N/A 12/29/2016   Procedure:  A-Flutter Ablation;  Surgeon: [name], MD;  Location: [facility];  Service: Cardiovascular;  Laterality: N/A;  . CARDIOVERSION N/A 11/05/2016   Procedure: CARDIOVERSION;  Surgeon: Sueanne Margarita, MD;  Location: [facility];  Service: Cardiovascular;  Laterality: N/A;  . IR THORACENTESIS ASP PLEURAL SPACE W/IMG GUIDE  10/01/2016  . JOINT REPLACEMENT    . MICROLARYNGOSCOPY  09/02/2007   with excision of right vocal cord mass, Dr. Constance Holster  . TOTAL KNEE ARTHROPLASTY Left 07/22/2017  . TOTAL KNEE ARTHROPLASTY Left 07/22/2017   Procedure: LEFT TOTAL KNEE ARTHROPLASTY;  Surgeon: Leandrew Koyanagi, MD;  Location: [facility];  Service: Orthopedics;  Laterality: Left;   Social History   Occupational History  . Not on file  Tobacco Use  . Smoking status: Current Every Day Smoker    Packs/day: 0.13    Years: 45.00    Pack years: 5.85    Types: Cigarettes  . Smokeless tobacco: Never Used  . Tobacco comment: "quit off and on"  Substance and Sexual Activity  . Alcohol use: Yes    Comment: 07/22/2017 "nothing since 2016"  . Drug use: No    Comment: 07/22/2017 "none since 11/2016" cocaine (sister is not aware)  . Sexual activity: Not Currently

## 2017-09-02 NOTE — Telephone Encounter (Signed)
See message below °

## 2017-09-02 NOTE — Addendum Note (Signed)
Addended by: Precious Bard on: 09/02/2017 08:51 AM   Modules accepted: Orders

## 2017-09-02 NOTE — Telephone Encounter (Signed)
Yes that's fine 

## 2017-09-03 NOTE — Telephone Encounter (Signed)
Called patient to advise. He is aware that it is okay.

## 2017-09-06 DIAGNOSIS — I110 Hypertensive heart disease with heart failure: Secondary | ICD-10-CM | POA: Diagnosis not present

## 2017-09-06 DIAGNOSIS — Z471 Aftercare following joint replacement surgery: Secondary | ICD-10-CM | POA: Diagnosis not present

## 2017-09-06 DIAGNOSIS — M069 Rheumatoid arthritis, unspecified: Secondary | ICD-10-CM | POA: Diagnosis not present

## 2017-09-06 DIAGNOSIS — M109 Gout, unspecified: Secondary | ICD-10-CM | POA: Diagnosis not present

## 2017-09-06 DIAGNOSIS — I5033 Acute on chronic diastolic (congestive) heart failure: Secondary | ICD-10-CM | POA: Diagnosis not present

## 2017-09-06 DIAGNOSIS — M19041 Primary osteoarthritis, right hand: Secondary | ICD-10-CM | POA: Diagnosis not present

## 2017-09-09 DIAGNOSIS — I110 Hypertensive heart disease with heart failure: Secondary | ICD-10-CM | POA: Diagnosis not present

## 2017-09-09 DIAGNOSIS — Z471 Aftercare following joint replacement surgery: Secondary | ICD-10-CM | POA: Diagnosis not present

## 2017-09-09 DIAGNOSIS — I5033 Acute on chronic diastolic (congestive) heart failure: Secondary | ICD-10-CM | POA: Diagnosis not present

## 2017-09-09 DIAGNOSIS — M069 Rheumatoid arthritis, unspecified: Secondary | ICD-10-CM | POA: Diagnosis not present

## 2017-09-09 DIAGNOSIS — M109 Gout, unspecified: Secondary | ICD-10-CM | POA: Diagnosis not present

## 2017-09-09 DIAGNOSIS — M19041 Primary osteoarthritis, right hand: Secondary | ICD-10-CM | POA: Diagnosis not present

## 2017-09-13 DIAGNOSIS — M19041 Primary osteoarthritis, right hand: Secondary | ICD-10-CM | POA: Diagnosis not present

## 2017-09-13 DIAGNOSIS — I5033 Acute on chronic diastolic (congestive) heart failure: Secondary | ICD-10-CM | POA: Diagnosis not present

## 2017-09-13 DIAGNOSIS — I110 Hypertensive heart disease with heart failure: Secondary | ICD-10-CM | POA: Diagnosis not present

## 2017-09-13 DIAGNOSIS — M069 Rheumatoid arthritis, unspecified: Secondary | ICD-10-CM | POA: Diagnosis not present

## 2017-09-13 DIAGNOSIS — Z471 Aftercare following joint replacement surgery: Secondary | ICD-10-CM | POA: Diagnosis not present

## 2017-09-13 DIAGNOSIS — M109 Gout, unspecified: Secondary | ICD-10-CM | POA: Diagnosis not present

## 2017-09-16 DIAGNOSIS — I5033 Acute on chronic diastolic (congestive) heart failure: Secondary | ICD-10-CM | POA: Diagnosis not present

## 2017-09-16 DIAGNOSIS — M19041 Primary osteoarthritis, right hand: Secondary | ICD-10-CM | POA: Diagnosis not present

## 2017-09-16 DIAGNOSIS — M109 Gout, unspecified: Secondary | ICD-10-CM | POA: Diagnosis not present

## 2017-09-16 DIAGNOSIS — Z471 Aftercare following joint replacement surgery: Secondary | ICD-10-CM | POA: Diagnosis not present

## 2017-09-16 DIAGNOSIS — M069 Rheumatoid arthritis, unspecified: Secondary | ICD-10-CM | POA: Diagnosis not present

## 2017-09-16 DIAGNOSIS — I110 Hypertensive heart disease with heart failure: Secondary | ICD-10-CM | POA: Diagnosis not present

## 2017-09-21 ENCOUNTER — Ambulatory Visit (INDEPENDENT_AMBULATORY_CARE_PROVIDER_SITE_OTHER): Payer: Medicare Other | Admitting: Physical Medicine and Rehabilitation

## 2017-09-21 ENCOUNTER — Ambulatory Visit (INDEPENDENT_AMBULATORY_CARE_PROVIDER_SITE_OTHER): Payer: Medicare Other

## 2017-09-21 ENCOUNTER — Encounter (INDEPENDENT_AMBULATORY_CARE_PROVIDER_SITE_OTHER): Payer: Self-pay | Admitting: Physical Medicine and Rehabilitation

## 2017-09-21 DIAGNOSIS — M25551 Pain in right hip: Secondary | ICD-10-CM | POA: Diagnosis not present

## 2017-09-21 NOTE — Patient Instructions (Signed)

## 2017-09-21 NOTE — Progress Notes (Signed)
Brian [name] - 67 y.o. male MRN [id number]  Date of birth: Apr 07, 1951  Office Visit Note: Visit Date: 09/21/2017 PCP: Brian Melter, MD Referred by: Brian Melter, MD  Subjective: Chief Complaint  Patient presents with  . Right Hip - Pain  . Left Hip - Pain   HPI: Brian [name] is a 67 year old gentleman with rheumatoid arthritis followed by Brian [name] who comes in today at the request of Brian [name] for diagnostic and hopefully therapeutic right hip anesthetic arthrogram.  Patient is undergone recent left total knee replacement.  He medically is quite complicated with congestive heart failure as well as diagnosis of anxiety and depression.    ROS Otherwise per HPI.  Assessment & Plan: Visit Diagnoses:  1. Pain in right hip     Plan: No additional findings.   Meds & Orders: No orders of the defined types were placed in this encounter.   Orders Placed This Encounter  Procedures  . Large Joint Inj: R hip joint  . XR C-ARM NO REPORT    Follow-up: Return if symptoms worsen or fail to improve, for Brian [name].   Procedures: Large Joint Inj: R hip joint on 09/21/2017 2:25 PM Indications: pain and diagnostic evaluation Details: 22 G needle, anterior approach  Arthrogram: Yes  Medications: 80 mg triamcinolone acetonide 40 MG/ML; 3 mL bupivacaine 0.5 % Outcome: tolerated well, no immediate complications  Arthrogram demonstrated excellent flow of contrast throughout the joint surface without extravasation or obvious defect.  The patient had relief of symptoms during the anesthetic phase of the injection.  Procedure, treatment alternatives, risks and benefits explained, specific risks discussed. Consent was given by the patient. Immediately prior to procedure a time out was called to verify the correct patient, procedure, equipment, support staff and site/side marked as required. Patient was prepped and draped in the usual sterile fashion.      No notes on file   Clinical  History: No specialty comments available.   He reports that he has been smoking cigarettes.  He has a 5.85 pack-year smoking history. He has never used smokeless tobacco.  Recent Labs    09/30/16 1901 12/01/16 1000  HGBA1C 6.1*  --   LABURIC  --  4.7    Objective:  VS:  HT:    WT:   BMI:     BP:   HR: bpm  TEMP: ( )  RESP:  Physical Exam  Ortho Exam Imaging: Xr C-arm No Report  Result Date: 09/28/2017 Please see Notes or Procedures tab for imaging impression.   Past Medical/Family/Surgical/Social History: Medications & Allergies reviewed per EMR, new medications updated. Patient Active Problem List   Diagnosis Date Noted  . 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 Cellulitis 01/22/2017  . Polyarthritis 12/01/2016  . Cellulitis of left leg 11/29/2016  . Sepsis ([place]) 11/29/2016  . Effusion, left knee 11/13/2016  . Persistent atrial fibrillation ([place])   . Dyspnea   . Positive urine drug screen   . CAP (community acquired pneumonia) 09/30/2016  . Community acquired pneumonia of left lower lobe of lung ([place])   . Pleural effusion   . Tachypnea   . CVA (cerebral infarction) 09/27/2015  . Right hand pain 09/27/2015  . History of gout 09/27/2015  . Cocaine abuse ([place]) 09/27/2015  . TIA (transient ischemic attack) 09/27/2015  . Slurred speech   . Primary osteoarthritis of right hand   . Leukocytosis   . Essential hypertension   . Chronic diastolic CHF (congestive heart  failure) ([place])   . HTN (hypertension) 11/22/2013  . Chronic diastolic heart failure ([place]) 11/22/2013  . Noncompliance 11/17/2013  . Atrial flutter with rapid ventricular response ([place]) 11/16/2013  . Acute on chronic diastolic CHF (congestive heart failure) ([place]) 09/05/2012  . Tobacco abuse   . Obesity- BMI 35   . Diastolic CHF ([place])   . Atrial flutter ([place])   . Anxiety    Past Medical History:  Diagnosis Date  . Anemia   . Anxiety   . Atrial flutter ([place])    a. 08/2012  . CHF (congestive  heart failure) ([place])   . Dysrhythmia   . History of gout   . History of kidney stones    passed  . Hypertension   . Obesity   . Pneumonia 09/2016  . Rheumatoid arthritis ([place])    "a little bit qwhere" (07/22/2017)  . Shortness of breath    witrh exterion  . Tobacco abuse    Family History  Problem Relation Age of Onset  . Cancer Mother   . Heart disease Father   . Heart attack Father   . Cancer Father   . Diabetes Sister    Past Surgical History:  Procedure Laterality Date  . A-FLUTTER ABLATION N/A 12/29/2016   Procedure: A-Flutter Ablation;  Surgeon: [name], MD;  Location: [facility];  Service: Cardiovascular;  Laterality: N/A;  . CARDIOVERSION N/A 11/05/2016   Procedure: CARDIOVERSION;  Surgeon: Sueanne Margarita, MD;  Location: [facility];  Service: Cardiovascular;  Laterality: N/A;  . IR THORACENTESIS ASP PLEURAL SPACE W/IMG GUIDE  10/01/2016  . JOINT REPLACEMENT    . MICROLARYNGOSCOPY  09/02/2007   with excision of right vocal cord mass, Dr. Constance Holster  . TOTAL KNEE ARTHROPLASTY Left 07/22/2017  . TOTAL KNEE ARTHROPLASTY Left 07/22/2017   Procedure: LEFT TOTAL KNEE ARTHROPLASTY;  Surgeon: Leandrew Koyanagi, MD;  Location: [facility];  Service: Orthopedics;  Laterality: Left;   Social History   Occupational History  . Not on file  Tobacco Use  . Smoking status: Current Every Day Smoker    Packs/day: 0.13    Years: 45.00    Pack years: 5.85    Types: Cigarettes  . Smokeless tobacco: Never Used  . Tobacco comment: "quit off and on"  Substance and Sexual Activity  . Alcohol use: Yes    Comment: 07/22/2017 "nothing since 2016"  . Drug use: No    Comment: 07/22/2017 "none since 11/2016" cocaine (sister is not aware)  . Sexual activity: Not Currently

## 2017-09-21 NOTE — Progress Notes (Signed)
Numeric Pain Rating Scale and Functional Assessment Average Pain 8   In the last MONTH (on 0-10 scale) has pain interfered with the following?  1. General activity like being  able to carry out your everyday physical activities such as walking, climbing stairs, carrying groceries, or moving a chair?  Rating(10)    +Driver, -BT, -Dye Allergies. 

## 2017-09-28 ENCOUNTER — Ambulatory Visit (INDEPENDENT_AMBULATORY_CARE_PROVIDER_SITE_OTHER): Payer: Medicare Other

## 2017-09-28 ENCOUNTER — Encounter (INDEPENDENT_AMBULATORY_CARE_PROVIDER_SITE_OTHER): Payer: Self-pay | Admitting: Physical Medicine and Rehabilitation

## 2017-09-28 ENCOUNTER — Ambulatory Visit (INDEPENDENT_AMBULATORY_CARE_PROVIDER_SITE_OTHER): Payer: Medicare Other | Admitting: Physical Medicine and Rehabilitation

## 2017-09-28 VITALS — BP 145/92 | HR 86 | Temp 98.0°F

## 2017-09-28 DIAGNOSIS — M25552 Pain in left hip: Secondary | ICD-10-CM

## 2017-09-28 MED ORDER — BUPIVACAINE HCL 0.5 % IJ SOLN
3.0000 mL | INTRAMUSCULAR | Status: AC | PRN
  Administered 2017-09-28: 3 mL via INTRA_ARTICULAR

## 2017-09-28 MED ORDER — TRIAMCINOLONE ACETONIDE 40 MG/ML IJ SUSP
80.0000 mg | INTRAMUSCULAR | Status: AC | PRN
  Administered 2017-09-28: 80 mg via INTRA_ARTICULAR

## 2017-09-28 NOTE — Progress Notes (Signed)
Brian [name] - 67 y.o. male MRN [id number]  Date of birth: [birth year]  Office Visit Note: Visit Date: 09/28/2017 PCP: Orpah Melter, MD Referred by: Orpah Melter, MD  Subjective: Chief Complaint  Patient presents with  . Left Hip - Pain   HPI: Brian [name] is a 67 year old gentleman followed by Dr. Erlinda Hong for his orthopedic complaints which include prior history of left total knee arthroplasty and now bilateral severe osteoarthritis of both hips.  We recently completed right hip anesthetic arthrogram with really good relief of his symptoms.  He is been able to get in and out of the shower by himself.  He still complains of left-sided pain not quite as bad as the right side.  When to complete this injection today.   ROS Otherwise per HPI.  Assessment & Plan: Visit Diagnoses:  1. Pain in left hip     Plan: Findings:  Left anesthetic hip arthrogram which hopefully will be diagnostic and therapeutic.  He did get relief during the anesthetic phase of the injection.    Meds & Orders: No orders of the defined types were placed in this encounter.   Orders Placed This Encounter  Procedures  . Large Joint Inj: L hip joint  . XR C-ARM NO REPORT    Follow-up: Return for Dr. Erlinda Hong as scheduled.   Procedures: Large Joint Inj: L hip joint on 09/28/2017 2:32 PM Indications: pain and diagnostic evaluation Details: 22 G needle, anterior approach  Arthrogram: Yes  Medications: 80 mg triamcinolone acetonide 40 MG/ML; 3 mL bupivacaine 0.5 % Outcome: tolerated well, no immediate complications  Arthrogram demonstrated excellent flow of contrast throughout the joint surface without extravasation or obvious defect.  The patient had relief of symptoms during the anesthetic phase of the injection.  Procedure, treatment alternatives, risks and benefits explained, specific risks discussed. Consent was given by the patient. Immediately prior to procedure a time out was called to verify the correct  patient, procedure, equipment, support staff and site/side marked as required. Patient was prepped and draped in the usual sterile fashion.      No notes on file   Clinical History: No specialty comments available.   He reports that he has been smoking cigarettes.  He has a 5.85 pack-year smoking history. He has never used smokeless tobacco.  Recent Labs    09/30/16 1901 12/01/16 1000  HGBA1C 6.1*  --   LABURIC  --  4.7    Objective:  VS:  HT:    WT:   BMI:     BP:(!) 145/92  HR:86bpm  TEMP:98 F (36.7 C)( )  RESP:98 % Physical Exam  Ortho Exam Imaging: Xr C-arm No Report  Result Date: 09/28/2017 Please see Notes or Procedures tab for imaging impression.   Past Medical/Family/Surgical/Social History: Medications & Allergies reviewed per EMR, new medications updated. Patient Active Problem List   Diagnosis Date Noted  . 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 Cellulitis 01/22/2017  . Polyarthritis 12/01/2016  . Cellulitis of left leg 11/29/2016  . Sepsis ([place]) 11/29/2016  . Effusion, left knee 11/13/2016  . Persistent atrial fibrillation ([place])   . Dyspnea   . Positive urine drug screen   . CAP (community acquired pneumonia) 09/30/2016  . Community acquired pneumonia of left lower lobe of lung ([place])   . Pleural effusion   . Tachypnea   . CVA (cerebral infarction) 09/27/2015  . Right hand pain 09/27/2015  . History of gout 09/27/2015  . Cocaine abuse ([place])  09/27/2015  . TIA (transient ischemic attack) 09/27/2015  . Slurred speech   . Primary osteoarthritis of right hand   . Leukocytosis   . Essential hypertension   . Chronic diastolic CHF (congestive heart failure) ([place])   . HTN (hypertension) 11/22/2013  . Chronic diastolic heart failure ([place]) 11/22/2013  . Noncompliance 11/17/2013  . Atrial flutter with rapid ventricular response ([place]) 11/16/2013  . Acute on chronic diastolic CHF (congestive heart failure) ([place]) 09/05/2012  . Tobacco abuse   .  Obesity- BMI 35   . Diastolic CHF ([place])   . Atrial flutter ([place])   . Anxiety    Past Medical History:  Diagnosis Date  . Anemia   . Anxiety   . Atrial flutter ([place])    a. 08/2012  . CHF (congestive heart failure) ([place])   . Dysrhythmia   . History of gout   . History of kidney stones    passed  . Hypertension   . Obesity   . Pneumonia 09/2016  . Rheumatoid arthritis ([place])    "a little bit qwhere" (07/22/2017)  . Shortness of breath    witrh exterion  . Tobacco abuse    Family History  Problem Relation Age of Onset  . Cancer Mother   . Heart disease Father   . Heart attack Father   . Cancer Father   . Diabetes Sister    Past Surgical History:  Procedure Laterality Date  . A-FLUTTER ABLATION N/A 12/29/2016   Procedure: A-Flutter Ablation;  Surgeon: [name], MD;  Location: [facility];  Service: Cardiovascular;  Laterality: N/A;  . CARDIOVERSION N/A 11/05/2016   Procedure: CARDIOVERSION;  Surgeon: Sueanne Margarita, MD;  Location: [facility];  Service: Cardiovascular;  Laterality: N/A;  . IR THORACENTESIS ASP PLEURAL SPACE W/IMG GUIDE  10/01/2016  . JOINT REPLACEMENT    . MICROLARYNGOSCOPY  09/02/2007   with excision of right vocal cord mass, Dr. Constance Holster  . TOTAL KNEE ARTHROPLASTY Left 07/22/2017  . TOTAL KNEE ARTHROPLASTY Left 07/22/2017   Procedure: LEFT TOTAL KNEE ARTHROPLASTY;  Surgeon: Leandrew Koyanagi, MD;  Location: [facility];  Service: Orthopedics;  Laterality: Left;   Social History   Occupational History  . Not on file  Tobacco Use  . Smoking status: Current Every Day Smoker    Packs/day: 0.13    Years: 45.00    Pack years: 5.85    Types: Cigarettes  . Smokeless tobacco: Never Used  . Tobacco comment: "quit off and on"  Substance and Sexual Activity  . Alcohol use: Yes    Comment: 07/22/2017 "nothing since 2016"  . Drug use: No    Comment: 07/22/2017 "none since 11/2016" cocaine (sister is not aware)  . Sexual activity: Not Currently

## 2017-09-28 NOTE — Patient Instructions (Signed)

## 2017-09-29 MED ORDER — BUPIVACAINE HCL 0.5 % IJ SOLN
3.0000 mL | INTRAMUSCULAR | Status: AC | PRN
  Administered 2017-09-21: 3 mL via INTRA_ARTICULAR

## 2017-09-29 MED ORDER — TRIAMCINOLONE ACETONIDE 40 MG/ML IJ SUSP
80.0000 mg | INTRAMUSCULAR | Status: AC | PRN
  Administered 2017-09-21: 80 mg via INTRA_ARTICULAR

## 2017-10-14 ENCOUNTER — Other Ambulatory Visit: Payer: Self-pay | Admitting: Cardiology

## 2017-10-14 ENCOUNTER — Encounter (INDEPENDENT_AMBULATORY_CARE_PROVIDER_SITE_OTHER): Payer: Self-pay | Admitting: Orthopaedic Surgery

## 2017-10-14 ENCOUNTER — Ambulatory Visit (INDEPENDENT_AMBULATORY_CARE_PROVIDER_SITE_OTHER): Payer: Medicare Other | Admitting: Orthopaedic Surgery

## 2017-10-14 DIAGNOSIS — M1611 Unilateral primary osteoarthritis, right hip: Secondary | ICD-10-CM

## 2017-10-14 DIAGNOSIS — Z96652 Presence of left artificial knee joint: Secondary | ICD-10-CM

## 2017-10-14 NOTE — Progress Notes (Deleted)
Office Visit Note   Patient: Brian [name]           Date of Birth: 1951-01-29           MRN: [id number] Visit Date: 10/14/2017              Requested by: Orpah Melter, MD [address] [place], [place] [address] PCP: Orpah Melter, MD   Assessment & Plan: Visit Diagnoses:  1. Status post total left knee replacement     Plan: ***  Follow-Up Instructions: Return in about 3 months (around 01/13/2018) for fu left total knee replacement.   Orders:  No orders of the defined types were placed in this encounter.  No orders of the defined types were placed in this encounter.     Procedures: No procedures performed   Clinical Data: No additional findings.   Subjective: Chief Complaint  Patient presents with  . Left Knee - Pain    HPI patient   Review of Systems   Objective: Vital Signs: There were no vitals taken for this visit.  Physical Exam  Ortho Exam  Specialty Comments:  No specialty comments available.  Imaging: No results found.   PMFS History: Patient Active Problem List   Diagnosis Date Noted  . 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 Cellulitis 01/22/2017  . Polyarthritis 12/01/2016  . Cellulitis of left leg 11/29/2016  . Sepsis ([place]) 11/29/2016  . Effusion, left knee 11/13/2016  . Persistent atrial fibrillation ([place])   . Dyspnea   . Positive urine drug screen   . CAP (community acquired pneumonia) 09/30/2016  . Community acquired pneumonia of left lower lobe of lung ([place])   . Pleural effusion   . Tachypnea   . CVA (cerebral infarction) 09/27/2015  . Right hand pain 09/27/2015  . History of gout 09/27/2015  . Cocaine abuse ([place]) 09/27/2015  . TIA (transient ischemic attack) 09/27/2015  . Slurred speech   . Primary osteoarthritis of right hand   . Leukocytosis   . Essential hypertension   . Chronic diastolic CHF (congestive heart failure) ([place])   . HTN (hypertension) 11/22/2013  . Chronic diastolic heart  failure ([place]) 11/22/2013  . Noncompliance 11/17/2013  . Atrial flutter with rapid ventricular response ([place]) 11/16/2013  . Acute on chronic diastolic CHF (congestive heart failure) ([place]) 09/05/2012  . Tobacco abuse   . Obesity- BMI 35   . Diastolic CHF ([place])   . Atrial flutter ([place])   . Anxiety    Past Medical History:  Diagnosis Date  . Anemia   . Anxiety   . Atrial flutter ([place])    a. 08/2012  . CHF (congestive heart failure) ([place])   . Dysrhythmia   . History of gout   . History of kidney stones    passed  . Hypertension   . Obesity   . Pneumonia 09/2016  . Rheumatoid arthritis ([place])    "a little bit qwhere" (07/22/2017)  . Shortness of breath    witrh exterion  . Tobacco abuse     Family History  Problem Relation Age of Onset  . Cancer Mother   . Heart disease Father   . Heart attack Father   . Cancer Father   . Diabetes Sister     Past Surgical History:  Procedure Laterality Date  . A-FLUTTER ABLATION N/A 12/29/2016   Procedure: A-Flutter Ablation;  Surgeon: [name], MD;  Location: [facility];  Service: Cardiovascular;  Laterality: N/A;  .  CARDIOVERSION N/A 11/05/2016   Procedure: CARDIOVERSION;  Surgeon: Sueanne Margarita, MD;  Location: [facility];  Service: Cardiovascular;  Laterality: N/A;  . IR THORACENTESIS ASP PLEURAL SPACE W/IMG GUIDE  10/01/2016  . JOINT REPLACEMENT    . MICROLARYNGOSCOPY  09/02/2007   with excision of right vocal cord mass, Dr. Constance Holster  . TOTAL KNEE ARTHROPLASTY Left 07/22/2017  . TOTAL KNEE ARTHROPLASTY Left 07/22/2017   Procedure: LEFT TOTAL KNEE ARTHROPLASTY;  Surgeon: Leandrew Koyanagi, MD;  Location: [facility];  Service: Orthopedics;  Laterality: Left;   Social History   Occupational History  . Not on file  Tobacco Use  . Smoking status: Current Every Day Smoker    Packs/day: 0.13    Years: 45.00    Pack years: 5.85    Types: Cigarettes  . Smokeless tobacco: Never Used  . Tobacco comment: "quit off and on"  Substance and  Sexual Activity  . Alcohol use: Yes    Comment: 07/22/2017 "nothing since 2016"  . Drug use: No    Comment: 07/22/2017 "none since 11/2016" cocaine (sister is not aware)  . Sexual activity: Not Currently

## 2017-10-14 NOTE — Progress Notes (Signed)
Office Visit Note   Patient: Brian [name]           Date of Birth: 1950/10/31           MRN: [id number] Visit Date: 10/14/2017              Requested by: Brian Melter, MD [address] [place], [place] [address] PCP: Brian Melter, MD   Assessment & Plan: Visit Diagnoses:  1. Status post total left knee replacement   2. Primary osteoarthritis of right hip     Plan: From a total knee replacement standpoint he is doing well I want him to continue to work with physical therapy and home exercises to strengthen this.  He would like to go ahead and schedule surgery for his right total hip replacement.  He understands the risks and benefits.  We will schedule him for at least 6 weeks from his most recent cortisone injection.  He will need to come off of Humira and Xarelto which he did last time for the knee replacement.  Patient is encouraged and answered.  Follow-Up Instructions: Return in about 3 months (around 01/13/2018) for fu left total knee replacement.   Orders:  No orders of the defined types were placed in this encounter.  No orders of the defined types were placed in this encounter.     Procedures: No procedures performed   Clinical Data: No additional findings.   Subjective: Chief Complaint  Patient presents with  . Left Knee - Pain    Brian [name] is approximately 3 months status post left total knee replacement.  He is doing well.  He is also here to discuss right total hip replacement.  This is significantly bothering him.  He is having more trouble with his hips than his knees.  He is progressed very well with physical therapy in terms of his knee.  He is having significant difficulties with ADLs and chronic neck pain related to his hips.  He recently had a cortisone injection on 09/21/2017 in his right hip.   Review of Systems  Constitutional: Negative.   All other systems reviewed and are negative.    Objective: Vital Signs: There were no  vitals taken for this visit.  Physical Exam  Constitutional: He is oriented to person, place, and time. He appears well-developed and well-nourished.  Pulmonary/Chest: Effort normal.  Abdominal: Soft.  Neurological: He is alert and oriented to person, place, and time.  Skin: Skin is warm.  Psychiatric: He has a normal mood and affect. His behavior is normal. Judgment and thought content normal.  Nursing note and vitals reviewed.   Ortho Exam Right hip exam shows pain with rotation of the hip.  Positive Stinchfield sign. Left knee exam shows a well-healed surgical scar.  Range of motion is excellent for 90 days. Specialty Comments:  No specialty comments available.  Imaging: No results found.   PMFS History: Patient Active Problem List   Diagnosis Date Noted  . 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 Cellulitis 01/22/2017  . Polyarthritis 12/01/2016  . Cellulitis of left leg 11/29/2016  . Sepsis ([place]) 11/29/2016  . Effusion, left knee 11/13/2016  . Persistent atrial fibrillation ([place])   . Dyspnea   . Positive urine drug screen   . CAP (community acquired pneumonia) 09/30/2016  . Community acquired pneumonia of left lower lobe of lung ([place])   . Pleural effusion   . Tachypnea   . CVA (cerebral infarction) 09/27/2015  .  Right hand pain 09/27/2015  . History of gout 09/27/2015  . Cocaine abuse ([place]) 09/27/2015  . TIA (transient ischemic attack) 09/27/2015  . Slurred speech   . Primary osteoarthritis of right hand   . Leukocytosis   . Essential hypertension   . Chronic diastolic CHF (congestive heart failure) ([place])   . HTN (hypertension) 11/22/2013  . Chronic diastolic heart failure ([place]) 11/22/2013  . Noncompliance 11/17/2013  . Atrial flutter with rapid ventricular response ([place]) 11/16/2013  . Acute on chronic diastolic CHF (congestive heart failure) ([place]) 09/05/2012  . Tobacco abuse   . Obesity- BMI 35   . Diastolic CHF ([place])   . Atrial flutter ([place])   .  Anxiety    Past Medical History:  Diagnosis Date  . Anemia   . Anxiety   . Atrial flutter ([place])    a. 08/2012  . CHF (congestive heart failure) ([place])   . Dysrhythmia   . History of gout   . History of kidney stones    passed  . Hypertension   . Obesity   . Pneumonia 09/2016  . Rheumatoid arthritis ([place])    "a little bit qwhere" (07/22/2017)  . Shortness of breath    witrh exterion  . Tobacco abuse     Family History  Problem Relation Age of Onset  . Cancer Mother   . Heart disease Father   . Heart attack Father   . Cancer Father   . Diabetes Sister     Past Surgical History:  Procedure Laterality Date  . A-FLUTTER ABLATION N/A 12/29/2016   Procedure: A-Flutter Ablation;  Surgeon: Brian Grayer, MD;  Location: [facility];  Service: Cardiovascular;  Laterality: N/A;  . CARDIOVERSION N/A 11/05/2016   Procedure: CARDIOVERSION;  Surgeon: Brian Margarita, MD;  Location: [facility];  Service: Cardiovascular;  Laterality: N/A;  . IR THORACENTESIS ASP PLEURAL SPACE W/IMG GUIDE  10/01/2016  . JOINT REPLACEMENT    . MICROLARYNGOSCOPY  09/02/2007   with excision of right vocal cord mass, Dr. Constance [name]  . TOTAL KNEE ARTHROPLASTY Left 07/22/2017  . TOTAL KNEE ARTHROPLASTY Left 07/22/2017   Procedure: LEFT TOTAL KNEE ARTHROPLASTY;  Surgeon: Brian Koyanagi, MD;  Location: [facility];  Service: Orthopedics;  Laterality: Left;   Social History   Occupational History  . Not on file  Tobacco Use  . Smoking status: Current Every Day Smoker    Packs/day: 0.13    Years: 45.00    Pack years: 5.85    Types: Cigarettes  . Smokeless tobacco: Never Used  . Tobacco comment: "quit off and on"  Substance and Sexual Activity  . Alcohol use: Yes    Comment: 07/22/2017 "nothing since 2016"  . Drug use: No    Comment: 07/22/2017 "none since 11/2016" cocaine (sister is not aware)  . Sexual activity: Not Currently

## 2017-10-30 ENCOUNTER — Other Ambulatory Visit: Payer: Self-pay | Admitting: Cardiology

## 2017-11-04 NOTE — Pre-Procedure Instructions (Signed)
KEINO PLACENCIA  11/04/2017    Your procedure is scheduled on  Wednesday, Nov 17, 2017 at 10:00 AM.   Report to [facility] Entrance "A" Admitting Office at 8:00 AM.   Call this number if you have problems the morning of surgery: [phone]   Questions prior to day of surgery, please call [phone] between 8 & 4 PM.   Remember:  Do not eat food or drink liquids after midnight Tuesday, 11/16/17.  Take these medicines the morning of surgery with A SIP OF WATER: Metoprolol (Toprol XL), Ranitidine (Zantac)  Stop Xarelto as instructed by your surgeon or physician. Do not use NSAIDS (Ibuprofen, Aleve, etc) or Aspirin products 7 days prior to surgery.  Do NOT smoke 24 hours prior to surgery.   Do not wear jewelry.  Do not wear lotions, powders, cologne or deodorant.  Men may shave face and neck.  Do not bring valuables to the hospital.  [facility] is not responsible for any belongings or valuables.  Contacts, dentures or bridgework may not be worn into surgery.  Leave your suitcase in the car.  After surgery it may be brought to your room.  For patients admitted to the hospital, discharge time will be determined by your treatment team.  [facility] - Preparing for Surgery  Before surgery, you can play an important role.  Because skin is not sterile, your skin needs to be as free of germs as possible.  You can reduce the number of germs on you skin by washing with CHG (chlorahexidine gluconate) soap before surgery.  CHG is an antiseptic cleaner which kills germs and bonds with the skin to continue killing germs even after washing.  Please DO NOT use if you have an allergy to CHG or antibacterial soaps.  If your skin becomes reddened/irritated stop using the CHG and inform your nurse when you arrive at Short Stay.  Do not shave (including legs and underarms) for at least 48 hours prior to the first CHG shower.  You may shave your face.  Please follow these instructions  carefully:   1.  Shower with CHG Soap the night before surgery and the                    morning of Surgery.  2.  If you choose to wash your hair, wash your hair first as usual with your       normal shampoo.  3.  After you shampoo, rinse your hair and body thoroughly to remove the shampoo.  4.  Use CHG as you would any other liquid soap.  You can apply chg directly       to the skin and wash gently with scrungie or a clean washcloth.  5.  Apply the CHG Soap to your body ONLY FROM THE NECK DOWN.        Do not use on open wounds or open sores.  Avoid contact with your eyes, ears, mouth and genitals (private parts).  Wash genitals (private parts) with your normal soap.  6.  Wash thoroughly, paying special attention to the area where your surgery        will be performed.  7.  Thoroughly rinse your body with warm water from the neck down.  8.  DO NOT shower/wash with your normal soap after using and rinsing off       the CHG Soap.  9.  Pat yourself dry with a clean towel.  10.  Wear clean pajamas.            11.  Place clean sheets on your bed the night of your first shower and do not        sleep with pets.  Day of Surgery  Shower as above. Do not apply any lotions/deodorants the morning of surgery.  Please wear clean clothes to the hospital.   Please read over the fact sheets that you were given.

## 2017-11-05 ENCOUNTER — Other Ambulatory Visit: Payer: Self-pay

## 2017-11-05 ENCOUNTER — Encounter (HOSPITAL_COMMUNITY)
Admission: RE | Admit: 2017-11-05 | Discharge: 2017-11-05 | Disposition: A | Discharge status: Home / Self Care | Payer: Medicare Other | Source: Ambulatory Visit | Attending: Orthopaedic Surgery | Admitting: Orthopaedic Surgery

## 2017-11-05 ENCOUNTER — Other Ambulatory Visit (INDEPENDENT_AMBULATORY_CARE_PROVIDER_SITE_OTHER): Payer: Self-pay

## 2017-11-05 ENCOUNTER — Encounter (HOSPITAL_COMMUNITY): Payer: Self-pay

## 2017-11-05 DIAGNOSIS — M1611 Unilateral primary osteoarthritis, right hip: Secondary | ICD-10-CM | POA: Diagnosis not present

## 2017-11-05 DIAGNOSIS — Z01812 Encounter for preprocedural laboratory examination: Secondary | ICD-10-CM | POA: Insufficient documentation

## 2017-11-05 LAB — CBC WITH DIFFERENTIAL/PLATELET
BASOS ABS: 0.1 10*3/uL (ref 0.0–0.1)
Basophils Relative: 1 %
Eosinophils Absolute: 0.2 10*3/uL (ref 0.0–0.7)
Eosinophils Relative: 2 %
HEMATOCRIT: 45.6 % (ref 39.0–52.0)
Hemoglobin: 14.6 g/dL (ref 13.0–17.0)
LYMPHS PCT: 20 %
Lymphs Abs: 2.1 10*3/uL (ref 0.7–4.0)
MCH: 30.4 pg (ref 26.0–34.0)
MCHC: 32 g/dL (ref 30.0–36.0)
MCV: 94.8 fL (ref 78.0–100.0)
MONO ABS: 0.9 10*3/uL (ref 0.1–1.0)
MONOS PCT: 8 %
Neutro Abs: 7.1 10*3/uL (ref 1.7–7.7)
Neutrophils Relative %: 69 %
Platelets: 354 10*3/uL (ref 150–400)
RBC: 4.81 MIL/uL (ref 4.22–5.81)
RDW: 15.1 % (ref 11.5–15.5)
WBC: 10.3 10*3/uL (ref 4.0–10.5)

## 2017-11-05 LAB — COMPREHENSIVE METABOLIC PANEL
ALT: 19 U/L (ref 17–63)
ANION GAP: 8 (ref 5–15)
AST: 18 U/L (ref 15–41)
Albumin: 3.7 g/dL (ref 3.5–5.0)
Alkaline Phosphatase: 66 U/L (ref 38–126)
BILIRUBIN TOTAL: 0.3 mg/dL (ref 0.3–1.2)
BUN: 13 mg/dL (ref 6–20)
CALCIUM: 9.4 mg/dL (ref 8.9–10.3)
CO2: 27 mmol/L (ref 22–32)
Chloride: 106 mmol/L (ref 101–111)
Creatinine, Ser: 0.63 mg/dL (ref 0.61–1.24)
GLUCOSE: 121 mg/dL — AB (ref 65–99)
POTASSIUM: 4.4 mmol/L (ref 3.5–5.1)
Sodium: 141 mmol/L (ref 135–145)
TOTAL PROTEIN: 6.9 g/dL (ref 6.5–8.1)

## 2017-11-05 LAB — TYPE AND SCREEN
ABO/RH(D): A POS
ANTIBODY SCREEN: NEGATIVE

## 2017-11-05 LAB — PROTIME-INR
INR: 1.12
Prothrombin Time: 14.3 seconds (ref 11.4–15.2)

## 2017-11-05 LAB — APTT: APTT: 35 s (ref 24–36)

## 2017-11-05 LAB — SURGICAL PCR SCREEN
MRSA, PCR: NEGATIVE
Staphylococcus aureus: NEGATIVE

## 2017-11-05 NOTE — Progress Notes (Addendum)
PCP is Dr. Orpah Melter States he saw a heart Dr years ago and doesn't remember the Dr name Denies chest pain, cough, or fever. Echo noted 09-28-2015 Denies ever having a stress test or card cath.  State he was told to hold Xarelto 3 days prior to surgery. He states that he will not take Humira 2 weeks before and 2 weeks after surgery.  Instructed not to smoke 24 hours prior to surgery -voices understanding.

## 2017-11-08 ENCOUNTER — Other Ambulatory Visit (INDEPENDENT_AMBULATORY_CARE_PROVIDER_SITE_OTHER): Payer: Self-pay | Admitting: Physician Assistant

## 2017-11-08 MED ORDER — MUPIROCIN 2 % EX OINT: TOPICAL_OINTMENT | CUTANEOUS | 0 refills | Status: AC

## 2017-11-08 NOTE — Progress Notes (Signed)
Anesthesia Chart Review:  Case:  [id number] Date/Time:  11/17/17 0945   Procedure:  RIGHT TOTAL HIP ARTHROPLASTY ANTERIOR APPROACH (Right )   Anesthesia type:  Spinal   Pre-op diagnosis:  right hip degenerative joint disease   Location:  [facility] OR ROOM 08 / [facility]   Surgeon:  Leandrew Koyanagi, MD     DISCUSSION: Patient is a 67 year old male scheduled for the above procedure. He is s/p left TKA 07/22/17.   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 Admission 08/2015 for transient slurred speech in the setting of pain medication. Evaluated for possible TIA by neurology. Brain MRI was negative for CVA. (Patient reported he was ultimately told it was a medication reaction and not a "mini stroke") BMI is consistent with obesity.  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 He had been having hip and knee pain. No signs of infection. No LLE DVT. EKG showed NSR. Creatinine was increased. Near syncope felt possibly vasovagal versus multifactorial. He was hydrated. (Follow-up phone calls indicate patient requested letter for work because pain was causing him to almost pass out. Patient says he thinks he was dehydrated. He denied any recurrent episodes. He is currently off work.)  - Admission 01/22/17 - 01/24/17 for sepsis presumed secondary to LLE cellulitis. Treated with antibiotics.  - Admission 11/27/16 - 12/04/16 for LLE cellulitis with sepsis and acute polyarthralgia. Rheumatoid factor positive. Referred to rheumatology.  He tolerated left TKA earlier this year. He was felt acceptable CV risk for that procedure, although has not seen cardiology since that surgery.  Most recently he has been in [place], and he previously reported that he is able to tell when he is in afib. HR 88 at PAT. States he was told to hold Xarelto 3 days prior to surgery and will not take Humira 2 weeks before or 2 weeks after surgery.   If no acute changes then I anticipate that he can proceed with surgery as planned.   VS: BP 130/72   Pulse 88   Temp 36.6 C   Resp 20   Ht 5' 9.5" (1.765 m)   Wt 233 lb 1.6 oz (105.7 kg)   SpO2 96%   BMI 33.93 kg/m   PROVIDERS: - PCP is listed as Dr. Orpah Melter. - Rheumatologist is with [facility]. Has been seeing Leafy Kindle, PA-C. - Primary cardiologist is Dr. Candee Furbish, first seen 09/2016 (previously saw Dr. Mare Ferrari, last 2016). She was referred to EP cardiology. EP cardiologist is Dr. [name]. Last visit was on 03/18/17 with Roderic Palau, NP [facility]). Patient was back in afib. He was also anemic. Patient was to follow-up with Dr. Rayann Heman after anemia addressed/improved to consider repeat cardioversion, but ultimately converted back to SR by 05/18/17. Cardiology felt patient was "acceptable risk" for his 06/2017 knee replacement surgery and did not recommend further work-up at that time (see 06/24/17 telephone encounter by Bernerd Pho, PA-C).   LABS: Labs reviewed: Acceptable for surgery. (all labs ordered are listed, but only abnormal results are displayed)  Labs Reviewed  COMPREHENSIVE METABOLIC PANEL - Abnormal; Notable for the following components:      Result Value   Glucose, Bld 121 (*)    All  other components within normal limits  SURGICAL PCR SCREEN  APTT  CBC WITH DIFFERENTIAL/PLATELET  PROTIME-INR  TYPE AND SCREEN     IMAGES: CXR 05/18/17: IMPRESSION: No acute cardiopulmonary findings.  EKG: EKG 05/18/17: NSR.  CV: Echo 09/28/15: Study Conclusions - Left ventricle: The cavity size was normal. Systolic function was normal. The estimated ejection fraction was in the range of  55% to 60%. Wall motion was normal; there were no regional wall motion abnormalities. Left ventricular diastolic function parameters were normal. - Aortic valve: There was trivial regurgitation. Valve area (VTI): 1.79 cm^2. Valve area (Vmax): 2.01 cm^2. Valve area (Vmean): 2 cm^2. - Atrial septum: There was increased thickness of the septum, consistent with lipomatous hypertrophy.   Past Medical History:  Diagnosis Date  . Anemia   . Anxiety   . Atrial flutter ([place])    a. 08/2012  . CHF (congestive heart failure) ([place])   . Dysrhythmia   . History of gout   . History of kidney stones    passed  . Hypertension   . Obesity   . Pneumonia 09/2016  . Rheumatoid arthritis ([place])    "a little bit qwhere" (07/22/2017)  . Shortness of breath    witrh exterion  . Tobacco abuse     Past Surgical History:  Procedure Laterality Date  . A-FLUTTER ABLATION N/A 12/29/2016   Procedure: A-Flutter Ablation;  Surgeon: [name], MD;  Location: [facility];  Service: Cardiovascular;  Laterality: N/A;  . CARDIOVERSION N/A 11/05/2016   Procedure: CARDIOVERSION;  Surgeon: Sueanne Margarita, MD;  Location: [facility];  Service: Cardiovascular;  Laterality: N/A;  . IR THORACENTESIS ASP PLEURAL SPACE W/IMG GUIDE  10/01/2016  . JOINT REPLACEMENT    . MICROLARYNGOSCOPY  09/02/2007   with excision of right vocal cord mass, Dr. Constance Holster  . TOTAL KNEE ARTHROPLASTY Left 07/22/2017  . TOTAL KNEE ARTHROPLASTY Left 07/22/2017   Procedure: LEFT TOTAL KNEE ARTHROPLASTY;  Surgeon: Leandrew Koyanagi, MD;  Location: [facility];  Service: Orthopedics;  Laterality: Left;    MEDICATIONS: . Adalimumab (HUMIRA) 40 MG/0.4ML PSKT  . cephALEXin (KEFLEX) 500 MG capsule  . docusate sodium (COLACE) 100 MG capsule  . famotidine (PEPCID) 20 MG tablet  . folic acid (FOLVITE) 1 MG tablet  . furosemide (LASIX) 40 MG tablet  . HYDROcodone-acetaminophen (NORCO) 5-325 MG tablet  . methocarbamol (ROBAXIN) 500 MG tablet  .  methotrexate (RHEUMATREX) 2.5 MG tablet  . metoprolol succinate (TOPROL XL) 25 MG 24 hr tablet  . mupirocin ointment (BACTROBAN) 2 %  . ondansetron (ZOFRAN) 4 MG tablet  . oxyCODONE (OXY IR/ROXICODONE) 5 MG immediate release tablet  . oxyCODONE (OXYCONTIN) 15 mg 12 hr tablet  . oxyCODONE 10 MG TABS  . potassium chloride SA (K-DUR,KLOR-CON) 20 MEQ tablet  . promethazine (PHENERGAN) 25 MG tablet  . ranitidine (ZANTAC) 150 MG tablet  . rivaroxaban (XARELTO) 20 MG TABS tablet  . senna-docusate (SENOKOT S) 8.6-50 MG tablet  . traMADol (ULTRAM) 50 MG tablet   No current facility-administered medications for this encounter.    [facility] Short Stay Center/Anesthesiology Phone [phone] 11/08/2017 5:01 PM

## 2017-11-10 ENCOUNTER — Other Ambulatory Visit: Payer: Self-pay | Admitting: Orthopaedic Surgery

## 2017-11-10 NOTE — Care Plan (Signed)
Spoke with patient. He lives alone with limited support. Was at [facility] place with TKR in January. If not able to return home would prefer to return there. Has all needed equipment at home. Requests [facility] for HHPT. Will follow for PT eval post op to determine best venue.    PA updated.   Thanks

## 2017-11-16 ENCOUNTER — Other Ambulatory Visit: Payer: Self-pay | Admitting: Cardiology

## 2017-11-16 MED ORDER — TRANEXAMIC ACID 1000 MG/10ML IV SOLN
1000.0000 mg | INTRAVENOUS | Status: AC
  Administered 2017-11-17: 1000 mg via INTRAVENOUS
  Filled 2017-11-16: qty 1100

## 2017-11-16 MED ORDER — TRANEXAMIC ACID 1000 MG/10ML IV SOLN
2000.0000 mg | INTRAVENOUS | Status: AC
  Administered 2017-11-17: 2000 mg via TOPICAL
  Filled 2017-11-16: qty 20

## 2017-11-17 ENCOUNTER — Inpatient Hospital Stay (HOSPITAL_COMMUNITY): Payer: Medicare Other

## 2017-11-17 ENCOUNTER — Inpatient Hospital Stay (HOSPITAL_COMMUNITY): Payer: Medicare Other | Admitting: Certified Registered Nurse Anesthetist

## 2017-11-17 ENCOUNTER — Encounter (HOSPITAL_COMMUNITY)
Admission: RE | Disposition: A | Discharge status: Home Health | Payer: Self-pay | Source: Home / Self Care | Attending: Orthopaedic Surgery

## 2017-11-17 ENCOUNTER — Inpatient Hospital Stay (HOSPITAL_COMMUNITY)
Admission: RE | Admit: 2017-11-17 | Discharge: 2017-11-21 | DRG: 470 | Disposition: A | Discharge status: Home Health | Payer: Medicare Other | Attending: Orthopaedic Surgery | Admitting: Orthopaedic Surgery

## 2017-11-17 ENCOUNTER — Encounter (HOSPITAL_COMMUNITY): Payer: Self-pay | Admitting: *Deleted

## 2017-11-17 ENCOUNTER — Inpatient Hospital Stay (HOSPITAL_COMMUNITY): Payer: Medicare Other | Admitting: Emergency Medicine

## 2017-11-17 ENCOUNTER — Other Ambulatory Visit: Payer: Self-pay

## 2017-11-17 DIAGNOSIS — Z96652 Presence of left artificial knee joint: Secondary | ICD-10-CM | POA: Diagnosis present

## 2017-11-17 DIAGNOSIS — K08409 Partial loss of teeth, unspecified cause, unspecified class: Secondary | ICD-10-CM | POA: Diagnosis present

## 2017-11-17 DIAGNOSIS — M1611 Unilateral primary osteoarthritis, right hip: Principal | ICD-10-CM | POA: Diagnosis present

## 2017-11-17 DIAGNOSIS — Z87442 Personal history of urinary calculi: Secondary | ICD-10-CM

## 2017-11-17 DIAGNOSIS — I509 Heart failure, unspecified: Secondary | ICD-10-CM | POA: Diagnosis present

## 2017-11-17 DIAGNOSIS — F17210 Nicotine dependence, cigarettes, uncomplicated: Secondary | ICD-10-CM | POA: Diagnosis present

## 2017-11-17 DIAGNOSIS — Z7901 Long term (current) use of anticoagulants: Secondary | ICD-10-CM

## 2017-11-17 DIAGNOSIS — Z79899 Other long term (current) drug therapy: Secondary | ICD-10-CM

## 2017-11-17 DIAGNOSIS — Z6833 Body mass index (BMI) 33.0-33.9, adult: Secondary | ICD-10-CM | POA: Diagnosis not present

## 2017-11-17 DIAGNOSIS — I110 Hypertensive heart disease with heart failure: Secondary | ICD-10-CM | POA: Diagnosis present

## 2017-11-17 DIAGNOSIS — Z91048 Other nonmedicinal substance allergy status: Secondary | ICD-10-CM

## 2017-11-17 DIAGNOSIS — I4892 Unspecified atrial flutter: Secondary | ICD-10-CM | POA: Diagnosis present

## 2017-11-17 DIAGNOSIS — Z833 Family history of diabetes mellitus: Secondary | ICD-10-CM | POA: Diagnosis not present

## 2017-11-17 DIAGNOSIS — I5030 Unspecified diastolic (congestive) heart failure: Secondary | ICD-10-CM | POA: Diagnosis not present

## 2017-11-17 DIAGNOSIS — Z8249 Family history of ischemic heart disease and other diseases of the circulatory system: Secondary | ICD-10-CM

## 2017-11-17 DIAGNOSIS — M069 Rheumatoid arthritis, unspecified: Secondary | ICD-10-CM | POA: Diagnosis present

## 2017-11-17 DIAGNOSIS — E669 Obesity, unspecified: Secondary | ICD-10-CM | POA: Diagnosis present

## 2017-11-17 DIAGNOSIS — Z471 Aftercare following joint replacement surgery: Secondary | ICD-10-CM | POA: Diagnosis not present

## 2017-11-17 DIAGNOSIS — D62 Acute posthemorrhagic anemia: Secondary | ICD-10-CM | POA: Diagnosis not present

## 2017-11-17 DIAGNOSIS — I639 Cerebral infarction, unspecified: Secondary | ICD-10-CM | POA: Diagnosis not present

## 2017-11-17 DIAGNOSIS — Z419 Encounter for procedure for purposes other than remedying health state, unspecified: Secondary | ICD-10-CM

## 2017-11-17 DIAGNOSIS — Z96641 Presence of right artificial hip joint: Secondary | ICD-10-CM | POA: Diagnosis not present

## 2017-11-17 DIAGNOSIS — Z79891 Long term (current) use of opiate analgesic: Secondary | ICD-10-CM | POA: Diagnosis not present

## 2017-11-17 DIAGNOSIS — Z96649 Presence of unspecified artificial hip joint: Secondary | ICD-10-CM

## 2017-11-17 HISTORY — PX: TOTAL HIP ARTHROPLASTY: SHX124

## 2017-11-17 SURGERY — ARTHROPLASTY, HIP, TOTAL, ANTERIOR APPROACH
Anesthesia: General | Site: Hip | Laterality: Right

## 2017-11-17 MED ORDER — FENTANYL CITRATE (PF) 250 MCG/5ML IJ SOLN
INTRAMUSCULAR | Status: AC
  Filled 2017-11-17: qty 5

## 2017-11-17 MED ORDER — SULFAMETHOXAZOLE-TRIMETHOPRIM 800-160 MG PO TABS: 1.0000 | ORAL_TABLET | Freq: Two times a day (BID) | ORAL | Status: DC

## 2017-11-17 MED ORDER — 0.9 % SODIUM CHLORIDE (POUR BTL) OPTIME
TOPICAL | Status: DC | PRN
  Administered 2017-11-17: 1000 mL

## 2017-11-17 MED ORDER — SODIUM CHLORIDE 0.9 % IR SOLN
Status: DC | PRN
Start: 2017-11-17 — End: 2017-11-17
  Administered 2017-11-17: 3000 mL

## 2017-11-17 MED ORDER — SULFAMETHOXAZOLE-TRIMETHOPRIM 800-160 MG PO TABS: 1.0000 | ORAL_TABLET | Freq: Two times a day (BID) | ORAL | 0 refills | Status: DC

## 2017-11-17 MED ORDER — MENTHOL 3 MG MT LOZG: 1.0000 | LOZENGE | OROMUCOSAL | Status: DC | PRN

## 2017-11-17 MED ORDER — MIDAZOLAM HCL 2 MG/2ML IJ SOLN
INTRAMUSCULAR | Status: AC
  Filled 2017-11-17: qty 2

## 2017-11-17 MED ORDER — ONDANSETRON HCL 4 MG/2ML IJ SOLN
INTRAMUSCULAR | Status: AC
  Filled 2017-11-17: qty 2

## 2017-11-17 MED ORDER — OXYCODONE HCL 5 MG PO TABS: 5.0000 mg | ORAL_TABLET | Freq: Once | ORAL | Status: DC | PRN

## 2017-11-17 MED ORDER — DEXAMETHASONE SODIUM PHOSPHATE 10 MG/ML IJ SOLN
INTRAMUSCULAR | Status: AC
  Filled 2017-11-17: qty 1

## 2017-11-17 MED ORDER — FENTANYL CITRATE (PF) 250 MCG/5ML IJ SOLN
INTRAMUSCULAR | Status: AC
Start: 2017-11-17 — End: ?
  Filled 2017-11-17: qty 5

## 2017-11-17 MED ORDER — OXYCODONE HCL ER 10 MG PO T12A: 10.0000 mg | EXTENDED_RELEASE_TABLET | Freq: Two times a day (BID) | ORAL | 0 refills | Status: AC

## 2017-11-17 MED ORDER — METOCLOPRAMIDE HCL 5 MG PO TABS: 5.0000 mg | ORAL_TABLET | Freq: Three times a day (TID) | ORAL | Status: DC | PRN

## 2017-11-17 MED ORDER — DOCUSATE SODIUM 100 MG PO CAPS
100.0000 mg | ORAL_CAPSULE | Freq: Two times a day (BID) | ORAL | Status: DC
  Administered 2017-11-17 – 2017-11-21 (×8): 100 mg via ORAL
  Filled 2017-11-17 (×8): qty 1

## 2017-11-17 MED ORDER — SUGAMMADEX SODIUM 200 MG/2ML IV SOLN
INTRAVENOUS | Status: AC
  Filled 2017-11-17: qty 2

## 2017-11-17 MED ORDER — OXYCODONE HCL 5 MG PO TABS
5.0000 mg | ORAL_TABLET | ORAL | Status: DC | PRN
  Administered 2017-11-17 – 2017-11-21 (×5): 10 mg via ORAL
  Filled 2017-11-17 (×4): qty 2

## 2017-11-17 MED ORDER — TRANEXAMIC ACID 1000 MG/10ML IV SOLN
1000.0000 mg | Freq: Once | INTRAVENOUS | Status: AC
  Administered 2017-11-17: 1000 mg via INTRAVENOUS
  Filled 2017-11-17: qty 10

## 2017-11-17 MED ORDER — DEXAMETHASONE SODIUM PHOSPHATE 10 MG/ML IJ SOLN
10.0000 mg | Freq: Once | INTRAMUSCULAR | Status: AC
  Administered 2017-11-18: 10 mg via INTRAVENOUS
  Filled 2017-11-17: qty 1

## 2017-11-17 MED ORDER — LACTATED RINGERS IV SOLN
INTRAVENOUS | Status: DC
  Administered 2017-11-17 (×3): via INTRAVENOUS

## 2017-11-17 MED ORDER — KETOROLAC TROMETHAMINE 15 MG/ML IJ SOLN
30.0000 mg | Freq: Four times a day (QID) | INTRAMUSCULAR | Status: AC
  Administered 2017-11-17 – 2017-11-18 (×3): 30 mg via INTRAVENOUS
  Filled 2017-11-17 (×2): qty 2

## 2017-11-17 MED ORDER — PROPOFOL 1000 MG/100ML IV EMUL
INTRAVENOUS | Status: AC
  Filled 2017-11-17: qty 100

## 2017-11-17 MED ORDER — HYDROMORPHONE HCL 2 MG/ML IJ SOLN
0.5000 mg | INTRAMUSCULAR | Status: DC | PRN
  Administered 2017-11-18 – 2017-11-19 (×2): 1 mg via INTRAVENOUS
  Filled 2017-11-17 (×2): qty 1

## 2017-11-17 MED ORDER — OXYCODONE HCL 5 MG PO TABS
10.0000 mg | ORAL_TABLET | ORAL | Status: DC | PRN
  Administered 2017-11-17: 10 mg via ORAL
  Administered 2017-11-18 – 2017-11-20 (×4): 15 mg via ORAL
  Filled 2017-11-17 (×2): qty 2
  Filled 2017-11-17 (×4): qty 3

## 2017-11-17 MED ORDER — CHLORHEXIDINE GLUCONATE 4 % EX LIQD: 60.0000 mL | Freq: Once | CUTANEOUS | Status: DC

## 2017-11-17 MED ORDER — PROMETHAZINE HCL 25 MG PO TABS: 25.0000 mg | ORAL_TABLET | Freq: Four times a day (QID) | ORAL | 1 refills | Status: DC | PRN

## 2017-11-17 MED ORDER — METHOCARBAMOL 500 MG PO TABS
ORAL_TABLET | ORAL | Status: AC
  Filled 2017-11-17: qty 1

## 2017-11-17 MED ORDER — KETOROLAC TROMETHAMINE 15 MG/ML IJ SOLN
INTRAMUSCULAR | Status: AC
  Filled 2017-11-17: qty 1

## 2017-11-17 MED ORDER — ONDANSETRON HCL 4 MG PO TABS: 4.0000 mg | ORAL_TABLET | Freq: Three times a day (TID) | ORAL | 0 refills | Status: DC | PRN

## 2017-11-17 MED ORDER — KETOROLAC TROMETHAMINE 30 MG/ML IJ SOLN
INTRAMUSCULAR | Status: AC
  Filled 2017-11-17: qty 1

## 2017-11-17 MED ORDER — DEXTROSE 5 % IV SOLN: 500.0000 mg | Freq: Four times a day (QID) | INTRAVENOUS | Status: DC | PRN

## 2017-11-17 MED ORDER — ACETAMINOPHEN 10 MG/ML IV SOLN
1000.0000 mg | Freq: Once | INTRAVENOUS | Status: AC
  Administered 2017-11-17: 1000 mg via INTRAVENOUS

## 2017-11-17 MED ORDER — CEFAZOLIN SODIUM-DEXTROSE 2-4 GM/100ML-% IV SOLN
2.0000 g | INTRAVENOUS | Status: AC
  Administered 2017-11-17: 2 g via INTRAVENOUS
  Filled 2017-11-17: qty 100

## 2017-11-17 MED ORDER — ONDANSETRON HCL 4 MG/2ML IJ SOLN
4.0000 mg | Freq: Four times a day (QID) | INTRAMUSCULAR | Status: DC | PRN
  Filled 2017-11-17: qty 2

## 2017-11-17 MED ORDER — SORBITOL 70 % SOLN: 30.0000 mL | Freq: Every day | Status: DC | PRN

## 2017-11-17 MED ORDER — ONDANSETRON HCL 4 MG PO TABS
4.0000 mg | ORAL_TABLET | Freq: Four times a day (QID) | ORAL | Status: DC | PRN
Start: 2017-11-17 — End: 2017-11-21

## 2017-11-17 MED ORDER — VANCOMYCIN HCL 1000 MG IV SOLR
INTRAVENOUS | Status: DC | PRN
  Administered 2017-11-17: 1000 mg via TOPICAL

## 2017-11-17 MED ORDER — RIVAROXABAN 20 MG PO TABS
20.0000 mg | ORAL_TABLET | Freq: Every day | ORAL | Status: DC
  Administered 2017-11-18 – 2017-11-20 (×3): 20 mg via ORAL
  Filled 2017-11-17 (×3): qty 1

## 2017-11-17 MED ORDER — OXYCODONE HCL ER 10 MG PO T12A
10.0000 mg | EXTENDED_RELEASE_TABLET | Freq: Two times a day (BID) | ORAL | Status: DC
  Administered 2017-11-17 – 2017-11-21 (×8): 10 mg via ORAL
  Filled 2017-11-17 (×8): qty 1

## 2017-11-17 MED ORDER — SUGAMMADEX SODIUM 200 MG/2ML IV SOLN
INTRAVENOUS | Status: DC | PRN
  Administered 2017-11-17: 200 mg via INTRAVENOUS

## 2017-11-17 MED ORDER — ROCURONIUM BROMIDE 100 MG/10ML IV SOLN
INTRAVENOUS | Status: DC | PRN
  Administered 2017-11-17: 10 mg via INTRAVENOUS
  Administered 2017-11-17: 60 mg via INTRAVENOUS
  Administered 2017-11-17 (×2): 10 mg via INTRAVENOUS

## 2017-11-17 MED ORDER — PHENYLEPHRINE 40 MCG/ML (10ML) SYRINGE FOR IV PUSH (FOR BLOOD PRESSURE SUPPORT)
PREFILLED_SYRINGE | INTRAVENOUS | Status: AC
  Filled 2017-11-17: qty 20

## 2017-11-17 MED ORDER — METHOCARBAMOL 750 MG PO TABS: 750.0000 mg | ORAL_TABLET | Freq: Two times a day (BID) | ORAL | 0 refills | Status: DC | PRN

## 2017-11-17 MED ORDER — SENNOSIDES-DOCUSATE SODIUM 8.6-50 MG PO TABS: 1.0000 | ORAL_TABLET | Freq: Every evening | ORAL | 1 refills | Status: DC | PRN

## 2017-11-17 MED ORDER — MAGNESIUM CITRATE PO SOLN: 1.0000 | Freq: Once | ORAL | Status: DC | PRN

## 2017-11-17 MED ORDER — PHENYLEPHRINE HCL 10 MG/ML IJ SOLN
INTRAVENOUS | Status: DC | PRN
  Administered 2017-11-17: 30 ug/min via INTRAVENOUS

## 2017-11-17 MED ORDER — POTASSIUM CHLORIDE CRYS ER 20 MEQ PO TBCR
20.0000 meq | EXTENDED_RELEASE_TABLET | Freq: Every day | ORAL | Status: DC
  Administered 2017-11-19 – 2017-11-21 (×3): 20 meq via ORAL
  Filled 2017-11-17 (×5): qty 1

## 2017-11-17 MED ORDER — LIDOCAINE 2% (20 MG/ML) 5 ML SYRINGE
INTRAMUSCULAR | Status: AC
  Filled 2017-11-17: qty 15

## 2017-11-17 MED ORDER — PHENOL 1.4 % MT LIQD: 1.0000 | OROMUCOSAL | Status: DC | PRN

## 2017-11-17 MED ORDER — OXYCODONE HCL 5 MG PO TABS
ORAL_TABLET | ORAL | Status: AC
  Filled 2017-11-17: qty 2

## 2017-11-17 MED ORDER — ACETAMINOPHEN 10 MG/ML IV SOLN
INTRAVENOUS | Status: AC
  Filled 2017-11-17: qty 100

## 2017-11-17 MED ORDER — OXYCODONE HCL 5 MG/5ML PO SOLN: 5.0000 mg | Freq: Once | ORAL | Status: DC | PRN

## 2017-11-17 MED ORDER — CEFAZOLIN SODIUM-DEXTROSE 2-4 GM/100ML-% IV SOLN
2.0000 g | Freq: Four times a day (QID) | INTRAVENOUS | Status: AC
  Administered 2017-11-17 – 2017-11-18 (×3): 2 g via INTRAVENOUS
  Filled 2017-11-17 (×3): qty 100

## 2017-11-17 MED ORDER — GABAPENTIN 300 MG PO CAPS
300.0000 mg | ORAL_CAPSULE | Freq: Three times a day (TID) | ORAL | Status: DC
  Administered 2017-11-17 – 2017-11-21 (×12): 300 mg via ORAL
  Filled 2017-11-17 (×13): qty 1

## 2017-11-17 MED ORDER — METOCLOPRAMIDE HCL 5 MG/ML IJ SOLN: 5.0000 mg | Freq: Three times a day (TID) | INTRAMUSCULAR | Status: DC | PRN

## 2017-11-17 MED ORDER — OXYCODONE HCL 5 MG PO TABS: 5.0000 mg | ORAL_TABLET | ORAL | 0 refills | Status: DC | PRN

## 2017-11-17 MED ORDER — METHOCARBAMOL 500 MG PO TABS
500.0000 mg | ORAL_TABLET | Freq: Four times a day (QID) | ORAL | Status: DC | PRN
  Administered 2017-11-17 – 2017-11-20 (×3): 500 mg via ORAL
  Filled 2017-11-17 (×3): qty 1

## 2017-11-17 MED ORDER — ALUM & MAG HYDROXIDE-SIMETH 200-200-20 MG/5ML PO SUSP: 30.0000 mL | ORAL | Status: DC | PRN

## 2017-11-17 MED ORDER — VANCOMYCIN HCL 1000 MG IV SOLR
INTRAVENOUS | Status: AC
  Filled 2017-11-17: qty 1000

## 2017-11-17 MED ORDER — HYDROMORPHONE HCL 2 MG/ML IJ SOLN
INTRAMUSCULAR | Status: AC
  Filled 2017-11-17: qty 1

## 2017-11-17 MED ORDER — CEPHALEXIN 500 MG PO CAPS
500.0000 mg | ORAL_CAPSULE | Freq: Four times a day (QID) | ORAL | Status: DC
  Administered 2017-11-18 – 2017-11-21 (×11): 500 mg via ORAL
  Filled 2017-11-17 (×11): qty 1

## 2017-11-17 MED ORDER — PROPOFOL 10 MG/ML IV BOLUS
INTRAVENOUS | Status: AC
  Filled 2017-11-17: qty 20

## 2017-11-17 MED ORDER — PROPOFOL 10 MG/ML IV BOLUS
INTRAVENOUS | Status: DC | PRN
  Administered 2017-11-17: 170 mg via INTRAVENOUS

## 2017-11-17 MED ORDER — ROCURONIUM BROMIDE 10 MG/ML (PF) SYRINGE
PREFILLED_SYRINGE | INTRAVENOUS | Status: AC
  Filled 2017-11-17: qty 5

## 2017-11-17 MED ORDER — HYDROMORPHONE HCL 2 MG/ML IJ SOLN
0.2500 mg | INTRAMUSCULAR | Status: DC | PRN
  Administered 2017-11-17 (×2): 0.5 mg via INTRAVENOUS

## 2017-11-17 MED ORDER — DIPHENHYDRAMINE HCL 12.5 MG/5ML PO ELIX: 25.0000 mg | ORAL_SOLUTION | ORAL | Status: DC | PRN

## 2017-11-17 MED ORDER — ACETAMINOPHEN 500 MG PO TABS
1000.0000 mg | ORAL_TABLET | Freq: Four times a day (QID) | ORAL | Status: AC
  Administered 2017-11-17 – 2017-11-18 (×4): 1000 mg via ORAL
  Filled 2017-11-17 (×4): qty 2

## 2017-11-17 MED ORDER — ROCURONIUM BROMIDE 10 MG/ML (PF) SYRINGE
PREFILLED_SYRINGE | INTRAVENOUS | Status: AC
  Filled 2017-11-17: qty 10

## 2017-11-17 MED ORDER — PHENYLEPHRINE 40 MCG/ML (10ML) SYRINGE FOR IV PUSH (FOR BLOOD PRESSURE SUPPORT)
PREFILLED_SYRINGE | INTRAVENOUS | Status: DC | PRN
  Administered 2017-11-17: 80 ug via INTRAVENOUS

## 2017-11-17 MED ORDER — POLYETHYLENE GLYCOL 3350 17 G PO PACK: 17.0000 g | PACK | Freq: Every day | ORAL | Status: DC | PRN

## 2017-11-17 MED ORDER — FENTANYL CITRATE (PF) 100 MCG/2ML IJ SOLN
INTRAMUSCULAR | Status: DC | PRN
  Administered 2017-11-17 (×3): 50 ug via INTRAVENOUS
  Administered 2017-11-17 (×2): 100 ug via INTRAVENOUS

## 2017-11-17 MED ORDER — ACETAMINOPHEN 325 MG PO TABS: 325.0000 mg | ORAL_TABLET | Freq: Four times a day (QID) | ORAL | Status: DC | PRN

## 2017-11-17 MED ORDER — LABETALOL HCL 5 MG/ML IV SOLN
INTRAVENOUS | Status: AC
  Filled 2017-11-17: qty 4

## 2017-11-17 MED ORDER — LIDOCAINE 2% (20 MG/ML) 5 ML SYRINGE
INTRAMUSCULAR | Status: DC | PRN
  Administered 2017-11-17: 80 mg via INTRAVENOUS

## 2017-11-17 MED ORDER — SODIUM CHLORIDE 0.9 % IV SOLN
INTRAVENOUS | Status: DC
  Administered 2017-11-17: 17:00:00 via INTRAVENOUS

## 2017-11-17 MED ORDER — ONDANSETRON HCL 4 MG/2ML IJ SOLN
INTRAMUSCULAR | Status: DC | PRN
  Administered 2017-11-17: 4 mg via INTRAVENOUS

## 2017-11-17 MED ORDER — DEXAMETHASONE SODIUM PHOSPHATE 10 MG/ML IJ SOLN
INTRAMUSCULAR | Status: DC | PRN
  Administered 2017-11-17: 10 mg via INTRAVENOUS

## 2017-11-17 MED ORDER — MIDAZOLAM HCL 2 MG/2ML IJ SOLN
INTRAMUSCULAR | Status: DC | PRN
  Administered 2017-11-17: 2 mg via INTRAVENOUS
  Administered 2017-11-17: 0.5 mg via INTRAVENOUS

## 2017-11-17 MED ORDER — METHOTREXATE 2.5 MG PO TABS
20.0000 mg | ORAL_TABLET | ORAL | Status: DC
  Administered 2017-11-19: 20 mg via ORAL
  Filled 2017-11-17: qty 8

## 2017-11-17 SURGICAL SUPPLY — 50 items
BAG DECANTER FOR FLEXI CONT (MISCELLANEOUS) ×3 IMPLANT
CAPT HIP TOTAL 2 ×3 IMPLANT
CELLS DAT CNTRL 66122 CELL SVR (MISCELLANEOUS) IMPLANT
COVER SURGICAL LIGHT HANDLE (MISCELLANEOUS) ×3 IMPLANT
DRAPE C-ARM 42X72 X-RAY (DRAPES) ×3 IMPLANT
DRAPE POUCH INSTRU U-SHP 10X18 (DRAPES) ×3 IMPLANT
DRAPE STERI IOBAN 125X83 (DRAPES) ×3 IMPLANT
DRAPE U-SHAPE 47X51 STRL (DRAPES) ×6 IMPLANT
DRSG AQUACEL AG ADV 3.5X10 (GAUZE/BANDAGES/DRESSINGS) ×3 IMPLANT
DURAPREP 26ML APPLICATOR (WOUND CARE) ×3 IMPLANT
ELECT BLADE 4.0 EZ CLEAN MEGAD (MISCELLANEOUS) ×3
ELECT REM PT RETURN 9FT ADLT (ELECTROSURGICAL) ×3
ELECTRODE BLDE 4.0 EZ CLN MEGD (MISCELLANEOUS) ×1 IMPLANT
ELECTRODE REM PT RTRN 9FT ADLT (ELECTROSURGICAL) ×1 IMPLANT
GAUZE XEROFORM 1X8 LF (GAUZE/BANDAGES/DRESSINGS) ×3 IMPLANT
GLOVE BIOGEL PI IND STRL 7.0 (GLOVE) ×1 IMPLANT
GLOVE BIOGEL PI INDICATOR 7.0 (GLOVE) ×2
GLOVE ECLIPSE 7.0 STRL STRAW (GLOVE) ×12 IMPLANT
GLOVE SKINSENSE NS SZ7.5 (GLOVE) ×2
GLOVE SKINSENSE STRL SZ7.5 (GLOVE) ×1 IMPLANT
GLOVE SURG SYN 7.5  E (GLOVE) ×12
GLOVE SURG SYN 7.5 E (GLOVE) ×6 IMPLANT
GOWN SRG XL XLNG 56XLVL 4 (GOWN DISPOSABLE) ×1 IMPLANT
GOWN STRL NON-REIN XL XLG LVL4 (GOWN DISPOSABLE) ×2
GOWN STRL REUS W/ TWL LRG LVL3 (GOWN DISPOSABLE) ×1 IMPLANT
GOWN STRL REUS W/TWL LRG LVL3 (GOWN DISPOSABLE) ×2
HANDPIECE INTERPULSE COAX TIP (DISPOSABLE) ×2
HOOD PEEL AWAY FLYTE STAYCOOL (MISCELLANEOUS) ×6 IMPLANT
IV NS IRRIG 3000ML ARTHROMATIC (IV SOLUTION) ×3 IMPLANT
KIT BASIN OR (CUSTOM PROCEDURE TRAY) ×3 IMPLANT
MARKER SKIN DUAL TIP RULER LAB (MISCELLANEOUS) ×3 IMPLANT
NEEDLE SPNL 18GX3.5 QUINCKE PK (NEEDLE) ×3 IMPLANT
PACK TOTAL JOINT (CUSTOM PROCEDURE TRAY) ×3 IMPLANT
PACK UNIVERSAL I (CUSTOM PROCEDURE TRAY) ×3 IMPLANT
RTRCTR WOUND ALEXIS 18CM MED (MISCELLANEOUS)
SAW OSC TIP CART 19.5X105X1.3 (SAW) ×3 IMPLANT
SET HNDPC FAN SPRY TIP SCT (DISPOSABLE) ×1 IMPLANT
STAPLER VISISTAT 35W (STAPLE) IMPLANT
SUT ETHIBOND 2 V 37 (SUTURE) ×3 IMPLANT
SUT ETHILON 2 0 PSLX (SUTURE) ×12 IMPLANT
SUT VIC AB 0 CT1 27 (SUTURE) ×2
SUT VIC AB 0 CT1 27XBRD ANTBC (SUTURE) ×1 IMPLANT
SUT VIC AB 1 CT1 27 (SUTURE) ×2
SUT VIC AB 1 CT1 27XBRD ANBCTR (SUTURE) ×1 IMPLANT
SUT VIC AB 2-0 CT1 27 (SUTURE) ×2
SUT VIC AB 2-0 CT1 TAPERPNT 27 (SUTURE) ×1 IMPLANT
SYRINGE 60CC LL (MISCELLANEOUS) ×3 IMPLANT
TOWEL OR 17X26 10 PK STRL BLUE (TOWEL DISPOSABLE) ×3 IMPLANT
TRAY CATH 16FR W/PLASTIC CATH (SET/KITS/TRAYS/PACK) IMPLANT
YANKAUER SUCT BULB TIP NO VENT (SUCTIONS) ×3 IMPLANT

## 2017-11-17 NOTE — Op Note (Signed)
RIGHT TOTAL HIP ARTHROPLASTY ANTERIOR APPROACH  Procedure Note Brian [name]   [id number]  Pre-op Diagnosis: right hip degenerative joint disease     Post-op Diagnosis: same   Operative Procedures  1. Total hip replacement; Right hip; uncemented [id number]   Personnel  Surgeon(s): Leandrew Koyanagi, MD  Assist: Madalyn Rob, PA-C; necessary for the timely completion of procedure and due to complexity of procedure.   Anesthesia: general  Prosthesis: Depuy Acetabulum: Pinnacle 54 mm Femur: Corail KA 16 Head: 36 mm size: +1.5 Liner: +4 neutral Bearing Type: ceramic on poly  Total Hip Arthroplasty (Anterior Approach) Op Note:  After informed consent was obtained and the operative extremity marked in the holding area, the patient was brought back to the operating room and placed supine on the HANA table. Next, the operative extremity was prepped and draped in normal sterile fashion. Surgical timeout occurred verifying patient identification, surgical site, surgical procedure and administration of antibiotics.  A modified anterior Smith-Peterson approach to the hip was performed, using the interval between tensor fascia lata and sartorius.  Dissection was carried bluntly down onto the anterior hip capsule. The lateral femoral circumflex vessels were identified and coagulated. A capsulotomy was performed and the capsular flaps tagged for later repair.  Fluoroscopy was utilized to prepare for the femoral neck cut. The neck osteotomy was performed. The femoral head was removed, the acetabular rim was cleared of soft tissue and attention was turned to reaming the acetabulum.  Sequential reaming was performed under fluoroscopic guidance. We reamed to a size 53 mm, and then impacted the acetabular shell. The liner was then placed after irrigation and attention turned to the femur.  After placing the femoral hook, the leg was taken to externally rotated, extended and adducted position  taking care to perform soft tissue releases to allow for adequate mobilization of the femur. Soft tissue was cleared from the shoulder of the greater trochanter and the hook elevator used to improve exposure of the proximal femur. Sequential broaching performed up to a size 16. Trial neck and head were placed. The leg was brought back up to neutral and the construct reduced. The position and sizing of components, offset and leg lengths were checked using fluoroscopy. Stability of the  construct was checked in extension and external rotation without any subluxation or impingement of prosthesis. We dislocated the prosthesis, dropped the leg back into position, removed trial components, and irrigated copiously. The final stem and head was then placed, the leg brought back up, the system reduced and fluoroscopy used to verify positioning.  We irrigated, obtained hemostasis and closed the capsule using #2 ethibond suture.  One gram of vancomycin powder was placed in the surgical bed. The fascia was closed with #1 vicryl plus, the deep fat layer was closed with 0 vicryl, the subcutaneous layers closed with 2.0 Vicryl Plus and the skin closed with 2.0 nylon and steri strips. A sterile dressing was applied. The patient was awakened in the operating room and taken to recovery in stable condition.  All sponge, needle, and instrument counts were correct at the end of the case.   Position: supine  Complications: none.  Time Out: performed   Drains/Packing: none  Estimated blood loss: 250 cc  Returned to Recovery Room: in good condition.   Antibiotics: yes   Mechanical VTE (DVT) Prophylaxis: sequential compression devices, TED thigh-high  Chemical VTE (DVT) Prophylaxis: resume xarelto   Fluid Replacement: see anesthesia record  Specimens Removed: 1 to pathology  Sponge and Instrument Count Correct? yes   PACU: portable radiograph - low AP   Admission: inpatient status  Plan/RTC: Return in 2 weeks  for staple removal. Weight Bearing/Load Lower Extremity: full  Hip precautions: none Suture Removal: 10-14 days  Betadine to incision twice daily once dressing is removed on POD#7  N. Eduard Roux, MD [place] 1:05 PM     Implant Name Type Inv. Item Serial No. Manufacturer Lot No. LRB No. Used  ACETABULAR CUP Daisy Blossom 54MM - [id number] Plate ACETABULAR CUP W GRIPTION 54MM  DEPUY SYNTHES [id number] Right 1  SCREW 6.5MMX25MM - [id number] Screw SCREW 6.5MMX25MM  DEPUY SYNTHES [id number] Right 1  LINER NEUTRAL 54X36MM PLUS 4 - [id number] Hips LINER NEUTRAL 54X36MM PLUS 4  DEPUY SYNTHES [id number] Right 1  STEM CORAIL KA16 - [id number] Stem STEM CORAIL KA16  DEPUY SYNTHES [id number] Right 1

## 2017-11-17 NOTE — Anesthesia Preprocedure Evaluation (Signed)
Anesthesia Evaluation  Patient identified by MRN, date of birth, ID band Patient awake    Reviewed: Allergy & Precautions, NPO status , Patient's Chart, lab work & pertinent test results  History of Anesthesia Complications Negative for: history of anesthetic complications  Airway Mallampati: II  TM Distance: <3 FB Neck ROM: Full    Dental  (+) Edentulous Upper   Pulmonary shortness of breath, Current Smoker,    breath sounds clear to auscultation       Cardiovascular hypertension, + dysrhythmias  Rhythm:Regular Rate:Normal     Neuro/Psych TIA   GI/Hepatic negative GI ROS, Neg liver ROS,   Endo/Other  negative endocrine ROS  Renal/GU negative Renal ROS     Musculoskeletal  (+) Arthritis ,   Abdominal (+) + obese,   Peds  Hematology   Anesthesia Other Findings   Reproductive/Obstetrics                             Anesthesia Physical Anesthesia Plan  ASA: III  Anesthesia Plan: General   Post-op Pain Management:    Induction: Intravenous  PONV Risk Score and Plan: 3 and Ondansetron and Dexamethasone  Airway Management Planned: Oral ETT  Additional Equipment:   Intra-op Plan:   Post-operative Plan: Extubation in OR  Informed Consent: I have reviewed the patients History and Physical, chart, labs and discussed the procedure including the risks, benefits and alternatives for the proposed anesthesia with the patient or authorized representative who has indicated his/her understanding and acceptance.   Dental advisory given  Plan Discussed with: CRNA  Anesthesia Plan Comments: (Pt prefers Gen anesth)        Anesthesia Quick Evaluation

## 2017-11-17 NOTE — H&P (Signed)
PREOPERATIVE H&P  Chief Complaint: right hip degenerative joint disease  HPI: Brian [name] is a 67 y.o. male who presents for surgical treatment of right hip degenerative joint disease.  He denies any changes in medical history.  Past Medical History:  Diagnosis Date  . Anemia   . Anxiety   . Atrial flutter ([place])    a. 08/2012  . CHF (congestive heart failure) ([place])   . Dysrhythmia   . History of gout   . History of kidney stones    passed  . Hypertension   . Obesity   . Pneumonia 09/2016  . Rheumatoid arthritis ([place])    "a little bit qwhere" (07/22/2017)  . Shortness of breath    witrh exterion  . Tobacco abuse    Past Surgical History:  Procedure Laterality Date  . A-FLUTTER ABLATION N/A 12/29/2016   Procedure: A-Flutter Ablation;  Surgeon: [name], MD;  Location: [facility];  Service: Cardiovascular;  Laterality: N/A;  . CARDIOVERSION N/A 11/05/2016   Procedure: CARDIOVERSION;  Surgeon: Sueanne Margarita, MD;  Location: [facility];  Service: Cardiovascular;  Laterality: N/A;  . IR THORACENTESIS ASP PLEURAL SPACE W/IMG GUIDE  10/01/2016  . JOINT REPLACEMENT    . MICROLARYNGOSCOPY  09/02/2007   with excision of right vocal cord mass, Dr. Constance Holster  . TOTAL KNEE ARTHROPLASTY Left 07/22/2017  . TOTAL KNEE ARTHROPLASTY Left 07/22/2017   Procedure: LEFT TOTAL KNEE ARTHROPLASTY;  Surgeon: Leandrew Koyanagi, MD;  Location: [facility];  Service: Orthopedics;  Laterality: Left;   Social History   Socioeconomic History  . Marital status: Divorced    Spouse name: Not on file  . Number of children: 3  . Years of education: Not on file  . Highest education level: Not on file  Occupational History  . Not on file  Social Needs  . Financial resource strain: Not on file  . Food insecurity:    Worry: Not on file    Inability: Not on file  . Transportation needs:    Medical: Not on file    Non-medical: Not on file  Tobacco Use  . Smoking status: Current Every Day Smoker    Packs/day: 0.13    Years: 45.00    Pack years: 5.85    Types: Cigarettes  . Smokeless tobacco: Never Used  . Tobacco comment: "quit off and on"  Substance and Sexual Activity  . Alcohol use: Yes    Comment: 07/22/2017 "nothing since 2016"  . Drug use: No    Comment: 07/22/2017 "none since 11/2016" cocaine (sister is not aware)  . Sexual activity: Not Currently  Lifestyle  . Physical activity:    Days per week: Not on file    Minutes per session: Not on file  . Stress: Not on file  Relationships  . Social connections:    Talks on phone: Not on file    Gets together: Not on file    Attends religious service: Not on file    Active member of club or organization: Not on file    Attends meetings of clubs or organizations: Not on file    Relationship status: Not on file  Other Topics Concern  . Not on file  Social History Narrative   Lives in [place]    Works at a convenience store   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   Divorced with 3 children and 9 grandchildren, he states ex-wife is still a friend   Family History  Problem Relation Age of Onset  . Cancer Mother   . Heart disease Father   . Heart attack Father   . Cancer Father   . Diabetes Sister    Allergies  Allergen Reactions  . Tape Rash and Other (See Comments)    Paper tape is preferred, please!!   Prior to Admission medications   Medication Sig Start Date End Date Taking? Authorizing Provider  Adalimumab (HUMIRA) 40 MG/0.4ML PSKT Inject 40 mg into the skin every 14 (fourteen) days.   Yes [provider]  docusate sodium (COLACE) 100 MG capsule Take 300 mg by mouth daily.    Yes [provider]  folic acid (FOLVITE) 1 MG tablet Take 1 mg by mouth daily. 04/15/17  Yes [provider]  furosemide (LASIX) 40 MG tablet Take 2 tablets (80 mg total) by mouth 2 (two) times daily. Please make overdue yearly appt with Dr.Skains before anymore refills.1st attempt 11/02/17  Yes Jerline Pain, MD    methotrexate (RHEUMATREX) 2.5 MG tablet Take 20 mg by mouth every Friday. In the morning. 04/26/17  Yes [provider]  metoprolol succinate (TOPROL XL) 25 MG 24 hr tablet Take 1 tablet (25 mg total) 2 (two) times daily by mouth. 05/11/17  Yes Skains, Thana Farr, MD  potassium chloride SA (K-DUR,KLOR-CON) 20 MEQ tablet Take 1 tablet (20 mEq total) by mouth daily. 10/26/16  Yes Jerline Pain, MD  ranitidine (ZANTAC) 150 MG tablet Take 150 mg by mouth daily.   Yes [provider]  rivaroxaban (XARELTO) 20 MG TABS tablet Take 1 tablet (20 mg total) by mouth daily with supper. Patient taking differently: Take 20 mg by mouth daily.  07/25/17  Yes Jessy Oto, MD  cephALEXin (KEFLEX) 500 MG capsule Take 1 capsule (500 mg total) by mouth every 6 (six) hours. Patient not taking: Reported on 11/03/2017 07/23/17   Leandrew Koyanagi, MD  famotidine (PEPCID) 20 MG tablet Take 1 tablet (20 mg total) by mouth at bedtime. Patient not taking: Reported on 11/03/2017 07/25/17   Jessy Oto, MD  HYDROcodone-acetaminophen [facility]) 5-325 MG tablet Take 1-2 tablets by mouth daily as needed. Patient not taking: Reported on 11/03/2017 09/02/17   Leandrew Koyanagi, MD  methocarbamol (ROBAXIN) 500 MG tablet Take 1 tablet (500 mg total) by mouth every 8 (eight) hours as needed for muscle spasms. Patient not taking: Reported on 11/03/2017 07/25/17   Jessy Oto, MD  mupirocin ointment Drue Stager) 2 % Apply to affected area 3 times daily 5 days pre-op 11/08/17 11/08/18  Aundra Dubin, PA-C  ondansetron (ZOFRAN) 4 MG tablet Take 1-2 tablets (4-8 mg total) by mouth every 8 (eight) hours as needed for nausea or vomiting. Patient not taking: Reported on 11/03/2017 07/22/17   Leandrew Koyanagi, MD  oxyCODONE (OXY IR/ROXICODONE) 5 MG immediate release tablet Take 1-3 tablets (5-15 mg total) by mouth every 4 (four) hours as needed. Patient not taking: Reported on 11/03/2017 07/22/17   Leandrew Koyanagi, MD  oxyCODONE (OXYCONTIN) 15 mg 12 hr  tablet Take 1 tablet (15 mg total) by mouth every 12 (twelve) hours. Patient not taking: Reported on 11/03/2017 07/25/17   Jessy Oto, MD  oxyCODONE 10 MG TABS Take 1 tablet (10 mg total) by mouth every 4 (four) hours as needed for severe pain ((score 7 to 10)). Patient not taking: Reported on 11/03/2017 07/25/17   Jessy Oto, MD  promethazine (PHENERGAN) 25 MG tablet Take 1 tablet (25  mg total) by mouth every 8 (eight) hours as needed for nausea. Patient not taking: Reported on 11/03/2017 07/25/17   Jessy Oto, MD  senna-docusate (SENOKOT S) 8.6-50 MG tablet Take 1 tablet by mouth at bedtime as needed. Patient not taking: Reported on 11/03/2017 07/22/17   Leandrew Koyanagi, MD  traMADol (ULTRAM) 50 MG tablet Take 1-2 tablets (50-100 mg total) by mouth 3 (three) times daily as needed. Patient not taking: Reported on 11/03/2017 09/02/17   Leandrew Koyanagi, MD     Positive ROS: All other systems have been reviewed and were otherwise negative with the exception of those mentioned in the HPI and as above.  Physical Exam: General: Alert, no acute distress Cardiovascular: No pedal edema Respiratory: No cyanosis, no use of accessory musculature GI: abdomen soft Skin: No lesions in the area of chief complaint Neurologic: Sensation intact distally Psychiatric: Patient is competent for consent with normal mood and affect Lymphatic: no lymphedema  MUSCULOSKELETAL: exam stable  Assessment: right hip degenerative joint disease  Plan: Plan for Procedure(s): RIGHT TOTAL HIP ARTHROPLASTY ANTERIOR APPROACH  The risks benefits and alternatives were discussed with the patient including but not limited to the risks of nonoperative treatment, versus surgical intervention including infection, bleeding, nerve injury,  blood clots, cardiopulmonary complications, morbidity, mortality, among others, and they were willing to proceed.   Preoperative templating of the joint replacement has been completed, documented,  and submitted to the Operating Room personnel in order to optimize intra-operative equipment management.  Eduard Roux, MD   11/17/2017 8:45 AM

## 2017-11-17 NOTE — Anesthesia Procedure Notes (Signed)
Procedure Name: Intubation Date/Time: 11/17/2017 11:21 AM Performed by: Leonor Liv, CRNA Pre-anesthesia Checklist: Patient identified, Emergency Drugs available, Suction available and Patient being monitored Patient Re-evaluated:Patient Re-evaluated prior to induction Oxygen Delivery Method: Circle System Utilized Preoxygenation: Pre-oxygenation with 100% oxygen Induction Type: IV induction Ventilation: Mask ventilation without difficulty Laryngoscope Size: Mac and 4 Grade View: Grade I Tube type: Oral Tube size: 7.5 mm Number of attempts: 1 Airway Equipment and Method: Stylet and Oral airway Placement Confirmation: ETT inserted through vocal cords under direct vision,  positive ETCO2 and breath sounds checked- equal and bilateral Secured at: 22 cm Tube secured with: Tape Dental Injury: Teeth and Oropharynx as per pre-operative assessment

## 2017-11-17 NOTE — Anesthesia Postprocedure Evaluation (Signed)
Anesthesia Post Note  Patient: Brian [name]  Procedure(s) Performed: RIGHT TOTAL HIP ARTHROPLASTY ANTERIOR APPROACH (Right Hip)     Patient location during evaluation: PACU Anesthesia Type: General Level of consciousness: awake, sedated and oriented Pain management: pain level controlled Vital Signs Assessment: post-procedure vital signs reviewed and stable Respiratory status: spontaneous breathing, nonlabored ventilation, respiratory function stable and patient connected to nasal cannula oxygen Cardiovascular status: blood pressure returned to baseline and stable Postop Assessment: no apparent nausea or vomiting Anesthetic complications: no    Last Vitals:  Vitals:   11/17/17 1445 11/17/17 1450  BP:    Pulse: 99 99  Resp: (!) 22 (!) 22  Temp:    SpO2: 93% 96%    Last Pain:  Vitals:   11/17/17 1400  TempSrc:   PainSc: 10-Worst pain ever                 Zarayah Lanting,Brian [name]

## 2017-11-17 NOTE — Transfer of Care (Signed)
Immediate Anesthesia Transfer of Care Note  Patient: Brian [name]  Procedure(s) Performed: RIGHT TOTAL HIP ARTHROPLASTY ANTERIOR APPROACH (Right Hip)  Patient Location: PACU  Anesthesia Type:General  Level of Consciousness: awake and confused  Airway & Oxygen Therapy: Patient Spontanous Breathing and Patient connected to nasal cannula oxygen  Post-op Assessment: Report given to RN, Post -op Vital signs reviewed and stable and Patient moving all extremities  Post vital signs: Reviewed and stable  Last Vitals:  Vitals Value Taken Time  BP 149/75 11/17/2017  1:41 PM  Temp    Pulse 74 11/17/2017  1:43 PM  Resp 12 11/17/2017  1:43 PM  SpO2 98 % 11/17/2017  1:43 PM  Vitals shown include unvalidated device data.  Last Pain:  Vitals:   11/17/17 1340  TempSrc:   PainSc: (P) Asleep         Complications: No apparent anesthesia complications

## 2017-11-18 ENCOUNTER — Other Ambulatory Visit: Payer: Self-pay | Admitting: Cardiology

## 2017-11-18 ENCOUNTER — Telehealth (INDEPENDENT_AMBULATORY_CARE_PROVIDER_SITE_OTHER): Payer: Self-pay

## 2017-11-18 LAB — CBC
HEMATOCRIT: 28.7 % — AB (ref 39.0–52.0)
Hemoglobin: 9 g/dL — ABNORMAL LOW (ref 13.0–17.0)
MCH: 29.6 pg (ref 26.0–34.0)
MCHC: 31.4 g/dL (ref 30.0–36.0)
MCV: 94.4 fL (ref 78.0–100.0)
Platelets: 209 10*3/uL (ref 150–400)
RBC: 3.04 MIL/uL — AB (ref 4.22–5.81)
RDW: 15 % (ref 11.5–15.5)
WBC: 12 10*3/uL — ABNORMAL HIGH (ref 4.0–10.5)

## 2017-11-18 LAB — BASIC METABOLIC PANEL
Anion gap: 7 (ref 5–15)
BUN: 15 mg/dL (ref 6–20)
CHLORIDE: 104 mmol/L (ref 101–111)
CO2: 27 mmol/L (ref 22–32)
Calcium: 8.1 mg/dL — ABNORMAL LOW (ref 8.9–10.3)
Creatinine, Ser: 0.83 mg/dL (ref 0.61–1.24)
GFR calc non Af Amer: >60 mL/min (ref >60)
Glucose, Bld: 217 mg/dL — ABNORMAL HIGH (ref 65–99)
POTASSIUM: 4.8 mmol/L (ref 3.5–5.1)
Sodium: 138 mmol/L (ref 135–145)

## 2017-11-18 MED ORDER — FUROSEMIDE 40 MG PO TABS
40.0000 mg | ORAL_TABLET | Freq: Two times a day (BID) | ORAL | Status: DC
  Administered 2017-11-18 – 2017-11-20 (×5): 40 mg via ORAL
  Filled 2017-11-18 (×6): qty 1

## 2017-11-18 MED ORDER — CEPHALEXIN 500 MG PO CAPS: 500.0000 mg | ORAL_CAPSULE | Freq: Four times a day (QID) | ORAL | 0 refills | Status: DC

## 2017-11-18 MED ORDER — FUROSEMIDE 20 MG PO TABS: 20.0000 mg | ORAL_TABLET | Freq: Two times a day (BID) | ORAL | Status: DC

## 2017-11-18 NOTE — Care Management Note (Signed)
Case Management Note  Patient Details  Name: Brian [name] MRN: [id number] Date of Birth: Oct 07, 1950  Subjective/Objective:  67 yr old gentleman s/p right total hip arthroplasty.                  Action/Plan: Patient was preoperatively setup with [facility], no changes. He has DME from previous surgery and will have support at discharge.   Expected Discharge Date:   11/19/17               Expected Discharge Plan:  [place]  In-House Referral:     Discharge planning Services  CM Consult  Post Acute Care Choice:  Home Health Choice offered to:  Patient  DME Arranged:  (Has RW and 3in1) DME Agency:  NA  HH Arranged:  PT [place] Agency:  [facility] (formerly [employer])  Status of Service:  Completed, signed off  If discussed at [employer], dates discussed:    Additional Comments:  Ninfa Meeker, RN 11/18/2017, 2:33 PM

## 2017-11-18 NOTE — Telephone Encounter (Signed)
Thank you :)

## 2017-11-18 NOTE — Telephone Encounter (Signed)
Spoke to nurse.  Reordered lasix and order is in for methotrexate for tomorrow

## 2017-11-18 NOTE — Discharge Instructions (Signed)
INSTRUCTIONS AFTER JOINT REPLACEMENT   o Remove items at home which could result in a fall. This includes throw rugs or furniture in walking pathways o ICE to the affected joint every three hours while awake for 30 minutes at a time, for at least the first 3-5 days, and then as needed for pain and swelling.  Continue to use ice for pain and swelling. You may notice swelling that will progress down to the foot and ankle.  This is normal after surgery.  Elevate your leg when you are not up walking on it.   o Continue to use the breathing machine you got in the hospital (incentive spirometer) which will help keep your temperature down.  It is common for your temperature to cycle up and down following surgery, especially at night when you are not up moving around and exerting yourself.  The breathing machine keeps your lungs expanded and your temperature down.   DIET:  As you were doing prior to hospitalization, we recommend a well-balanced diet.  DRESSING / WOUND CARE / SHOWERING  You may change your surgical dressing 7 days after surgery.  Then change the dressing every day with sterile gauze.  Please use good hand washing techniques before changing the dressing.  Do not use any lotions or creams on the incision until instructed by your surgeon.  You may shower while you have the surgical dressing which is waterproof.  After removal of surgical dressing, you must cover the incision when showering.  ACTIVITY  o Increase activity slowly as tolerated, but follow the weight bearing instructions below.   o No driving for 6 weeks or until further direction given by your physician.  You cannot drive while taking narcotics.  o No lifting or carrying greater than 10 lbs. until further directed by your surgeon. o Avoid periods of inactivity such as sitting longer than an hour when not asleep. This helps prevent blood clots.  o You may return to work once you are authorized by your doctor.     WEIGHT  BEARING   Weight bearing as tolerated with assist device (walker, cane, etc) as directed, use it as long as suggested by your surgeon or therapist, typically at least 4-6 weeks.   EXERCISES  Results after joint replacement surgery are often greatly improved when you follow the exercise, range of motion and muscle strengthening exercises prescribed by your doctor. Safety measures are also important to protect the joint from further injury. Any time any of these exercises cause you to have increased pain or swelling, decrease what you are doing until you are comfortable again and then slowly increase them. If you have problems or questions, call your caregiver or physical therapist for advice.   Rehabilitation is important following a joint replacement. After just a few days of immobilization, the muscles of the leg can become weakened and shrink (atrophy).  These exercises are designed to build up the tone and strength of the thigh and leg muscles and to improve motion. Often times heat used for twenty to thirty minutes before working out will loosen up your tissues and help with improving the range of motion but do not use heat for the first two weeks following surgery (sometimes heat can increase post-operative swelling).   These exercises can be done on a training (exercise) mat, on the floor, on a table or on a bed. Use whatever works the best and is most comfortable for you.    Use music or television while  you are exercising so that the exercises are a pleasant break in your day. This will make your life better with the exercises acting as a break in your routine that you can look forward to.   Perform all exercises about fifteen times, three times per day or as directed.  You should exercise both the operative leg and the other leg as well. ° °Exercises include: °  °• Quad Sets - Tighten up the muscle on the front of the thigh (Quad) and hold for 5-10 seconds.   °• Straight Leg Raises - With your  knee straight (if you were given a brace, keep it on), lift the leg to 60 degrees, hold for 3 seconds, and slowly lower the leg.  Perform this exercise against resistance later as your leg gets stronger.  °• Leg Slides: Lying on your back, slowly slide your foot toward your buttocks, bending your knee up off the floor (only go as far as is comfortable). Then slowly slide your foot back down until your leg is flat on the floor again.  °• Angel Wings: Lying on your back spread your legs to the side as far apart as you can without causing discomfort.  °• Hamstring Strength:  Lying on your back, push your heel against the floor with your leg straight by tightening up the muscles of your buttocks.  Repeat, but this time bend your knee to a comfortable angle, and push your heel against the floor.  You may put a pillow under the heel to make it more comfortable if necessary.  ° °A rehabilitation program following joint replacement surgery can speed recovery and prevent re-injury in the future due to weakened muscles. Contact your doctor or a physical therapist for more information on knee rehabilitation.  ° ° °CONSTIPATION ° °Constipation is defined medically as fewer than three stools per week and severe constipation as less than one stool per week.  Even if you have a regular bowel pattern at home, your normal regimen is likely to be disrupted due to multiple reasons following surgery.  Combination of anesthesia, postoperative narcotics, change in appetite and fluid intake all can affect your bowels.  ° °YOU MUST use at least one of the following options; they are listed in order of increasing strength to get the job done.  They are all available over the counter, and you may need to use some, POSSIBLY even all of these options:   ° °Drink plenty of fluids (prune juice may be helpful) and high fiber foods °Colace 100 mg by mouth twice a day  °Senokot for constipation as directed and as needed Dulcolax (bisacodyl), take  with full glass of water  °Miralax (polyethylene glycol) once or twice a day as needed. ° °If you have tried all these things and are unable to have a bowel movement in the first 3-4 days after surgery call either your surgeon or your primary doctor.   ° °If you experience loose stools or diarrhea, hold the medications until you stool forms back up.  If your symptoms do not get better within 1 week or if they get worse, check with your doctor.  If you experience "the worst abdominal pain ever" or develop nausea or vomiting, please contact the office immediately for further recommendations for treatment. ° ° °ITCHING:  If you experience itching with your medications, try taking only a single pain pill, or even half a pain pill at a time.  You can also use Benadryl over the counter   for itching or also to help with sleep.   TED HOSE STOCKINGS:  Use stockings on both legs until for at least 2 weeks or as directed by physician office. They may be removed at night for sleeping.  MEDICATIONS:  See your medication summary on the After Visit Summary that nursing will review with you.  You may have some home medications which will be placed on hold until you complete the course of blood thinner medication.  It is important for you to complete the blood thinner medication as prescribed.  PRECAUTIONS:  If you experience chest pain or shortness of breath - call 911 immediately for transfer to the hospital emergency department.   If you develop a fever greater that 101 F, purulent drainage from wound, increased redness or drainage from wound, foul odor from the wound/dressing, or calf pain - CONTACT YOUR SURGEON.                                                   FOLLOW-UP APPOINTMENTS:  If you do not already have a post-op appointment, please call the office for an appointment to be seen by your surgeon.  Guidelines for how soon to be seen are listed in your After Visit Summary, but are typically between 1-4 weeks  after surgery.  OTHER INSTRUCTIONS:   Knee Replacement:  Do not place pillow under knee, focus on keeping the knee straight while resting. CPM instructions: 0-90 degrees, 2 hours in the morning, 2 hours in the afternoon, and 2 hours in the evening. Place foam block, curve side up under heel at all times except when in CPM or when walking.  DO NOT modify, tear, cut, or change the foam block in any way.  MAKE SURE YOU:   Understand these instructions.   Get help right away if you are not doing well or get worse.    Thank you for letting us be a part of your medical care team.  It is a privilege we respect greatly.  We hope these instructions will help you stay on track for a fast and full recovery!    Information on my medicine - XARELTO (Rivaroxaban)  Why was Xarelto prescribed for you? Xarelto was prescribed for you to reduce the risk of a blood clot forming that can cause a stroke if you have a medical condition called atrial fibrillation (a type of irregular heartbeat).  What do you need to know about xarelto ? Take your Xarelto ONCE DAILY at the same time every day with your evening meal. If you have difficulty swallowing the tablet whole, you may crush it and mix in applesauce just prior to taking your dose.  Take Xarelto exactly as prescribed by your doctor and DO NOT stop taking Xarelto without talking to the doctor who prescribed the medication.  Stopping without other stroke prevention medication to take the place of Xarelto may increase your risk of developing a clot that causes a stroke.  Refill your prescription before you run out.  After discharge, you should have regular check-up appointments with your healthcare provider that is prescribing your Xarelto.  In the future your dose may need to be changed if your kidney function or weight changes by a significant amount.  What do you do if you miss a dose? If you are taking Xarelto ONCE DAILY and you  miss a dose,  take it as soon as you remember on the same day then continue your regularly scheduled once daily regimen the next day. Do not take two doses of Xarelto at the same time or on the same day.   Important Safety Information A possible side effect of Xarelto is bleeding. You should call your healthcare provider right away if you experience any of the following: ? Bleeding from an injury or your nose that does not stop. ? Unusual colored urine (red or dark brown) or unusual colored stools (red or black). ? Unusual bruising for unknown reasons. ? A serious fall or if you hit your head (even if there is no bleeding).  Some medicines may interact with Xarelto and might increase your risk of bleeding while on Xarelto. To help avoid this, consult your healthcare provider or pharmacist prior to using any new prescription or non-prescription medications, including herbals, vitamins, non-steroidal anti-inflammatory drugs (NSAIDs) and supplements.  This website has more information on Xarelto: [email or web address]

## 2017-11-18 NOTE — Progress Notes (Signed)
Physical Therapy Treatment Patient Details Name: Brian [name] MRN: [id number] DOB: [birth year] Today's Date: 11/18/2017    History of Present Illness Pt is a 67 y/o male s/p R THA, direct anterior approach. PMH including but not limited to CHF, HTN, L TKA on 07/22/17.    PT Comments    Pt making steady progress with functional mobility, tolerating increased distance ambulated this session. Pt will continue to follow acutely to progress mobility as tolerated. Will need stair training prior to d/c as pt has two steps to enter his home.   Pt would continue to benefit from skilled physical therapy services at this time while admitted and after d/c to address the below listed limitations in order to improve overall safety and independence with functional mobility.   Follow Up Recommendations  Home health PT;Supervision/Assistance - 24 hour     Equipment Recommendations  None recommended by PT    Recommendations for Other Services       Precautions / Restrictions Precautions Precautions: Fall Restrictions Weight Bearing Restrictions: Yes RLE Weight Bearing: Weight bearing as tolerated    Mobility  Bed Mobility Overal bed mobility: Needs Assistance Bed Mobility: Supine to Sit;Sit to Supine     Supine to sit: Min assist Sit to supine: Min assist   General bed mobility comments: increased time and effort, use of bed rails, min A for trunk elevation and to assist R LE back onto bed  Transfers Overall transfer level: Needs assistance Equipment used: Rolling walker (2 wheeled) Transfers: Sit to/from Stand Sit to Stand: Min assist         General transfer comment: cueing for hand placement, pt requesting assistance from his friend "JJ" to power into standing  Ambulation/Gait Ambulation/Gait assistance: Min guard Ambulation Distance (Feet): 150 Feet Assistive device: Rolling walker (2 wheeled) Gait Pattern/deviations: Step-through pattern;Decreased step length -  right;Decreased step length - left;Decreased stride length Gait velocity: decreased Gait velocity interpretation: <1.31 ft/sec, indicative of household ambulator General Gait Details: mild instability but no overt LOB or need for physical assistance, min guard for safety; no rest breaks required   Stairs             Wheelchair Mobility    Modified Rankin (Stroke Patients Only)       Balance Overall balance assessment: Needs assistance Sitting-balance support: Feet supported Sitting balance-Leahy Scale: Good     Standing balance support: During functional activity;Bilateral upper extremity supported Standing balance-Leahy Scale: Poor                              Cognition Arousal/Alertness: Awake/alert Behavior During Therapy: WFL for tasks assessed/performed Overall Cognitive Status: No family/caregiver present to determine baseline cognitive functioning Area of Impairment: Safety/judgement;Attention;Problem solving                   Current Attention Level: Sustained     Safety/Judgement: Decreased awareness of deficits;Decreased awareness of safety   Problem Solving: Difficulty sequencing;Requires verbal cues        Exercises Total Joint Exercises Ankle Circles/Pumps: AROM;Both;20 reps;Supine Quad Sets: AROM;Strengthening;Right;10 reps;Supine Heel Slides: AROM;AAROM;Right;20 reps;Supine Hip ABduction/ADduction: AROM;Strengthening;Right;10 reps;Supine Straight Leg Raises: AAROM;Right;10 reps;Supine    General Comments        Pertinent Vitals/Pain Pain Assessment: Faces Faces Pain Scale: Hurts even more Pain Location: R hip Pain Descriptors / Indicators: Burning;Sharp Pain Intervention(s): Monitored during session;Repositioned    Home Living  Prior Function            PT Goals (current goals can now be found in the care plan section) Acute Rehab PT Goals PT Goal Formulation: With patient Time  For Goal Achievement: 12/02/17 Potential to Achieve Goals: Good Progress towards PT goals: Progressing toward goals    Frequency    7X/week      PT Plan Current plan remains appropriate    Co-evaluation              AM-PAC PT "6 Clicks" Daily Activity  Outcome Measure  Difficulty turning over in bed (including adjusting bedclothes, sheets and blankets)?: None Difficulty moving from lying on back to sitting on the side of the bed? : Unable Difficulty sitting down on and standing up from a chair with arms (e.g., wheelchair, bedside commode, etc,.)?: Unable Help needed moving to and from a bed to chair (including a wheelchair)?: A Little Help needed walking in hospital room?: A Little Help needed climbing 3-5 steps with a railing? : A Little 6 Click Score: 15    End of Session Equipment Utilized During Treatment: Gait belt Activity Tolerance: Patient tolerated treatment well Patient left: in bed;with call bell/phone within reach Nurse Communication: Mobility status PT Visit Diagnosis: Other abnormalities of gait and mobility (R26.89);Pain Pain - Right/Left: Right Pain - part of body: Hip     Time: 6203-5597 PT Time Calculation (min) (ACUTE ONLY): 21 min  Charges:  $Therapeutic Activity: 8-22 mins                    G Codes:       [place], [place], [place] 11/18/2017, 4:22 PM

## 2017-11-18 NOTE — Care Plan (Signed)
Met with patient and his sister at the bedside. He will go home with HHPT provided by [facility]. He had them with his knee. He has all needed equipment at home from prior surgery.  ° °Please contact Renee Angiulli, RNCM [phone] with questions or if this plan should need to change.  ° °Thanks  °

## 2017-11-18 NOTE — Evaluation (Signed)
Physical Therapy Evaluation Patient Details Name: Brian [name] MRN: [id number] DOB: 09/30/1950 Today's Date: 11/18/2017   History of Present Illness  Pt is a 67 y/o male s/p R THA, direct anterior approach. PMH including but not limited to CHF, HTN, L TKA on 07/22/17.  Clinical Impression  Pt presented supine in bed with HOB elevated, awake and willing to participate in therapy session. Prior to admission, pt reported that he ambulated with either SPC or RW and required some assistance with bathing (needing help washing his back side). Pt has a sister that lives near by but is not in good health and recently fell (sounds like one UE is in a sling). Pt also has a friend that can assist intermittently. Pt currently requires min A for bed mobility, min guard for transfers and min guard to ambulate a short distance (50') in hallway with RW and min guard for safety. Pt limited secondary to pain this session. Pt would continue to benefit from skilled physical therapy services at this time while admitted and after d/c to address the below listed limitations in order to improve overall safety and independence with functional mobility.     Follow Up Recommendations Home health PT;Supervision/Assistance - 24 hour    Equipment Recommendations  None recommended by PT    Recommendations for Other Services       Precautions / Restrictions Precautions Precautions: Fall Restrictions Weight Bearing Restrictions: Yes RLE Weight Bearing: Weight bearing as tolerated      Mobility  Bed Mobility Overal bed mobility: Needs Assistance Bed Mobility: Supine to Sit;Sit to Supine     Supine to sit: Min guard Sit to supine: Min assist   General bed mobility comments: increased time and effort, use of bed rails, min A to assist R LE back onto bed  Transfers Overall transfer level: Needs assistance Equipment used: Rolling walker (2 wheeled) Transfers: Sit to/from Stand Sit to Stand: Min guard          General transfer comment: min guard for safety, cueing for hand placement  Ambulation/Gait Ambulation/Gait assistance: Min guard Ambulation Distance (Feet): 50 Feet Assistive device: Rolling walker (2 wheeled) Gait Pattern/deviations: Step-through pattern;Decreased step length - right;Decreased step length - left;Decreased stride length Gait velocity: decreased Gait velocity interpretation: <1.31 ft/sec, indicative of household ambulator General Gait Details: mild instability but no overt LOB or need for physical assistance, min guard for safety; pt required two standing rest breaks secondary to pain  Stairs            Wheelchair Mobility    Modified Rankin (Stroke Patients Only)       Balance Overall balance assessment: Needs assistance Sitting-balance support: Feet supported Sitting balance-Leahy Scale: Good     Standing balance support: During functional activity;Bilateral upper extremity supported Standing balance-Leahy Scale: Poor                               Pertinent Vitals/Pain Pain Assessment: Faces Faces Pain Scale: Hurts even more Pain Location: R hip Pain Descriptors / Indicators: Burning;Sharp Pain Intervention(s): Monitored during session;Repositioned    Home Living Family/patient expects to be discharged to:: Private residence Living Arrangements: Alone Available Help at Discharge: Family;Available PRN/intermittently Type of Home: House Home Access: Stairs to enter Entrance Stairs-Rails: None Entrance Stairs-Number of Steps: 2 Home Layout: One level Home Equipment: Cane - single point;Walker - 2 wheels;Bedside commode      Prior Function Level of Independence:  Needs assistance   Gait / Transfers Assistance Needed: mod Indep with SPC or RW  ADL's / Homemaking Assistance Needed: assistance with washing back        Hand Dominance        Extremity/Trunk Assessment   Upper Extremity Assessment Upper Extremity  Assessment: Overall WFL for tasks assessed    Lower Extremity Assessment Lower Extremity Assessment: RLE deficits/detail RLE Deficits / Details: pt with decreased strength and ROM limitations secondary to post-op pain and weakness       Communication   Communication: No difficulties  Cognition Arousal/Alertness: Awake/alert Behavior During Therapy: WFL for tasks assessed/performed Overall Cognitive Status: No family/caregiver present to determine baseline cognitive functioning Area of Impairment: Safety/judgement;Attention;Problem solving                   Current Attention Level: Sustained     Safety/Judgement: Decreased awareness of deficits;Decreased awareness of safety   Problem Solving: Difficulty sequencing;Requires verbal cues        General Comments      Exercises     Assessment/Plan    PT Assessment Patient needs continued PT services  PT Problem List Decreased strength;Decreased range of motion;Decreased mobility;Decreased activity tolerance;Decreased balance;Decreased coordination;Decreased knowledge of use of DME;Decreased safety awareness;Decreased knowledge of precautions;Pain       PT Treatment Interventions DME instruction;Gait training;Functional mobility training;Therapeutic activities;Stair training;Balance training;Therapeutic exercise;Neuromuscular re-education;Patient/family education    PT Goals (Current goals can be found in the Care Plan section)  Acute Rehab PT Goals Patient Stated Goal: decrease pain PT Goal Formulation: With patient Time For Goal Achievement: 12/02/17 Potential to Achieve Goals: Good    Frequency 7X/week   Barriers to discharge        Co-evaluation               AM-PAC PT "6 Clicks" Daily Activity  Outcome Measure Difficulty turning over in bed (including adjusting bedclothes, sheets and blankets)?: A Little Difficulty moving from lying on back to sitting on the side of the bed? : A Little Difficulty  sitting down on and standing up from a chair with arms (e.g., wheelchair, bedside commode, etc,.)?: Unable Help needed moving to and from a bed to chair (including a wheelchair)?: A Little Help needed walking in hospital room?: A Little Help needed climbing 3-5 steps with a railing? : A Little 6 Click Score: 16    End of Session Equipment Utilized During Treatment: Gait belt Activity Tolerance: Patient limited by fatigue;Patient limited by pain Patient left: in bed;with call bell/phone within reach;Other (comment)(pt refusing to sit up in chair this session, agreeable in PM) Nurse Communication: Mobility status PT Visit Diagnosis: Other abnormalities of gait and mobility (R26.89);Pain Pain - Right/Left: Right Pain - part of body: Hip    Time: 3267-1245 PT Time Calculation (min) (ACUTE ONLY): 30 min   Charges:   PT Evaluation $PT Eval Moderate Complexity: 1 Mod PT Treatments $Gait Training: 8-22 mins   PT G Codes:        [place], PT, DPT [place] 11/18/2017, 9:56 AM

## 2017-11-18 NOTE — Telephone Encounter (Signed)
See message.

## 2017-11-18 NOTE — Telephone Encounter (Signed)
Angela Nevin, nurse at [facility] called stating that patient needs order for Lasix 40mg  and Methotrexate 20mg .  CB# is [phone]. Please advise.  Thank you.

## 2017-11-18 NOTE — Progress Notes (Signed)
Subjective: 1 Day Post-Op Procedure(s) (LRB): RIGHT TOTAL HIP ARTHROPLASTY ANTERIOR APPROACH (Right) Patient reports pain as mild.  Doing ok this am.  Objective: Vital signs in last 24 hours: Temp:  [97.7 F (36.5 C)-98.4 F (36.9 C)] 98.4 F (36.9 C) (05/22 2256) Pulse Rate:  [73-99] 81 (05/23 0411) Resp:  [10-26] 18 (05/22 1615) BP: (113-162)/(65-147) 114/65 (05/23 0411) SpO2:  [90 %-100 %] 92 % (05/23 0411) Weight:  [233 lb 0.4 oz (105.7 kg)] 233 lb 0.4 oz (105.7 kg) (05/22 1615)  Intake/Output from previous day: 05/22 0701 - 05/23 0700 In: 1700 [I.V.:1600; IV Piggyback:100] Out: 1150 [Urine:650; Blood:500] Intake/Output this shift: No intake/output data recorded.  Recent Labs    11/18/17 0402  HGB 9.0*   Recent Labs    11/18/17 0402  WBC 12.0*  RBC 3.04*  HCT 28.7*  PLT 209   Recent Labs    11/18/17 0402  NA 138  K 4.8  CL 104  CO2 27  BUN 15  CREATININE 0.83  GLUCOSE 217*  CALCIUM 8.1*   No results for input(s): LABPT, INR in the last 72 hours.  Neurologically intact Neurovascular intact Sensation intact distally Intact pulses distally Dorsiflexion/Plantar flexion intact Incision: dressing C/D/I No cellulitis present Compartment soft    Assessment/Plan: 1 Day Post-Op Procedure(s) (LRB): RIGHT TOTAL HIP ARTHROPLASTY ANTERIOR APPROACH (Right) Advance diet Up with therapy D/C IV fluids  WBAT RLE-no precautions ABLA-mild and stable Unsure of d/c dispo at this time (sister lives next door, but hurt arm last week.  Nobody at home to help take care of patient)    Aundra Dubin 11/18/2017, 7:53 AM

## 2017-11-19 ENCOUNTER — Encounter (HOSPITAL_COMMUNITY): Payer: Self-pay | Admitting: Orthopaedic Surgery

## 2017-11-19 NOTE — Progress Notes (Signed)
Physical Therapy Treatment Patient Details Name: Brian [name] MRN: [id number] DOB: 1951/01/10 Today's Date: 11/19/2017    History of Present Illness Pt is a 67 y/o male s/p R THA, direct anterior approach. PMH including but not limited to CHF, HTN, L TKA on 07/22/17.    PT Comments    Pt very limited this session secondary to pain and declining gait or stair training this session. Pt's RN was notified. Will attempt during PM session  Pt would continue to benefit from skilled physical therapy services at this time while admitted and after d/c to address the below listed limitations in order to improve overall safety and independence with functional mobility.    Follow Up Recommendations  Home health PT;Supervision/Assistance - 24 hour     Equipment Recommendations  None recommended by PT    Recommendations for Other Services       Precautions / Restrictions Precautions Precautions: Fall Restrictions Weight Bearing Restrictions: Yes RLE Weight Bearing: Weight bearing as tolerated    Mobility  Bed Mobility Overal bed mobility: Needs Assistance Bed Mobility: Supine to Sit;Sit to Supine     Supine to sit: Min assist Sit to supine: Min assist   General bed mobility comments: increased time and effort, use of bed rails, min A for trunk elevation, min A for R LE movement onto and off of bed  Transfers Overall transfer level: Needs assistance Equipment used: Rolling walker (2 wheeled) Transfers: Sit to/from Stand Sit to Stand: Min assist;From elevated surface         General transfer comment: increased time and effort, assist to power into standing and for stability with transition as pt with LOB x1 towards his L requiring min A to recover  Ambulation/Gait             General Gait Details: pt refusing until he received pain meds - agreeable to therex   Stairs             Wheelchair Mobility    Modified Rankin (Stroke Patients Only)        Balance Overall balance assessment: Needs assistance Sitting-balance support: Feet supported Sitting balance-Leahy Scale: Good     Standing balance support: During functional activity;Bilateral upper extremity supported Standing balance-Leahy Scale: Poor                              Cognition Arousal/Alertness: Awake/alert Behavior During Therapy: Anxious Overall Cognitive Status: Within Functional Limits for tasks assessed                                        Exercises Total Joint Exercises Heel Slides: AAROM;Right;10 reps;Supine Straight Leg Raises: AAROM;Right;10 reps;Supine Long Arc Quad: AROM;Strengthening;Right;20 reps;Seated Marching in Standing: Seated;AAROM;Right;10 reps    General Comments        Pertinent Vitals/Pain Pain Assessment: Faces Faces Pain Scale: Hurts whole lot Pain Location: R hip Pain Descriptors / Indicators: Burning;Sharp Pain Intervention(s): Monitored during session;Repositioned;Patient requesting pain meds-RN notified    Home Living                      Prior Function            PT Goals (current goals can now be found in the care plan section) Acute Rehab PT Goals PT Goal Formulation: With patient Time For Goal Achievement:  12/02/17 Potential to Achieve Goals: Good Progress towards PT goals: Progressing toward goals    Frequency    7X/week      PT Plan Current plan remains appropriate    Co-evaluation              AM-PAC PT "6 Clicks" Daily Activity  Outcome Measure  Difficulty turning over in bed (including adjusting bedclothes, sheets and blankets)?: None Difficulty moving from lying on back to sitting on the side of the bed? : Unable Difficulty sitting down on and standing up from a chair with arms (e.g., wheelchair, bedside commode, etc,.)?: Unable Help needed moving to and from a bed to chair (including a wheelchair)?: A Little Help needed walking in hospital room?: A  Little Help needed climbing 3-5 steps with a railing? : A Little 6 Click Score: 15    End of Session Equipment Utilized During Treatment: Gait belt Activity Tolerance: Patient limited by pain Patient left: in bed;with call bell/phone within reach Nurse Communication: Mobility status PT Visit Diagnosis: Other abnormalities of gait and mobility (R26.89);Pain Pain - Right/Left: Right Pain - part of body: Hip     Time: 6761-9509 PT Time Calculation (min) (ACUTE ONLY): 17 min  Charges:  $Therapeutic Exercise: 8-22 mins                    G Codes:       [place], [place], [place] 11/19/2017, 9:26 AM

## 2017-11-19 NOTE — Social Work (Signed)
CSW acknowledging consult for SNF placement, per PT recommendations and orthopedics note pt will discharge home with home health on Sunday. CSW signing off. Please consult if any additional needs arise.  Alexander Mt, [place] Work [phone]

## 2017-11-19 NOTE — Progress Notes (Signed)
Physical Therapy Treatment Patient Details Name: Brian [name] MRN: [id number] DOB: [birth year] Today's Date: 11/19/2017    History of Present Illness Pt is a 67 y/o male s/p R THA, direct anterior approach. PMH including but not limited to CHF, HTN, L TKA on 07/22/17.    PT Comments    Pt making steady progress this session and successfully completed stair training. Per pt, he will be discharging home on Sunday. PT will continue to follow acutely to progress mobility as tolerated and to ensure a safe d/c home. Pt is ready to d/c home from PT perspective.  Pt would continue to benefit from skilled physical therapy services at this time while admitted and after d/c to address the below listed limitations in order to improve overall safety and independence with functional mobility.    Follow Up Recommendations  Home health PT;Supervision/Assistance - 24 hour     Equipment Recommendations  None recommended by PT    Recommendations for Other Services       Precautions / Restrictions Precautions Precautions: Fall Restrictions Weight Bearing Restrictions: Yes RLE Weight Bearing: Weight bearing as tolerated    Mobility  Bed Mobility Overal bed mobility: Needs Assistance Bed Mobility: Supine to Sit;Sit to Supine     Supine to sit: Min guard Sit to supine: Min assist   General bed mobility comments: increased time and effort, use of bed rails, min A for R LE movement back onto bed  Transfers Overall transfer level: Needs assistance Equipment used: Rolling walker (2 wheeled) Transfers: Sit to/from Stand Sit to Stand: Min assist         General transfer comment: increased time and effort, assist to power into standing and for stability with transition; pt performed x1 from EOB and x1 from recliner chair  Ambulation/Gait Ambulation/Gait assistance: Min guard Ambulation Distance (Feet): 150 Feet Assistive device: Rolling walker (2 wheeled) Gait Pattern/deviations:  Step-through pattern;Decreased step length - right;Decreased step length - left;Decreased stride length Gait velocity: decreased Gait velocity interpretation: 1.31 - 2.62 ft/sec, indicative of limited community ambulator General Gait Details: no instability or LOB, min guard for safety with RW   Stairs Stairs: Yes Stairs assistance: Min guard Stair Management: No rails;Step to pattern;Backwards;With walker Number of Stairs: 1(x2 bouts) General stair comments: PT demonstrated prior to, min guard for safety with cueing for technique   Wheelchair Mobility    Modified Rankin (Stroke Patients Only)       Balance Overall balance assessment: Needs assistance Sitting-balance support: Feet supported Sitting balance-Leahy Scale: Good     Standing balance support: During functional activity;Bilateral upper extremity supported Standing balance-Leahy Scale: Poor                              Cognition Arousal/Alertness: Awake/alert Behavior During Therapy: Anxious Overall Cognitive Status: Within Functional Limits for tasks assessed                                        Exercises      General Comments        Pertinent Vitals/Pain Pain Assessment: Faces Faces Pain Scale: Hurts whole lot Pain Location: R hip Pain Descriptors / Indicators: Burning;Sharp Pain Intervention(s): Monitored during session;Repositioned;Patient requesting pain meds-RN notified    Home Living  Prior Function            PT Goals (current goals can now be found in the care plan section) Acute Rehab PT Goals PT Goal Formulation: With patient Time For Goal Achievement: 12/02/17 Potential to Achieve Goals: Good Progress towards PT goals: Progressing toward goals    Frequency    7X/week      PT Plan Current plan remains appropriate    Co-evaluation              AM-PAC PT "6 Clicks" Daily Activity  Outcome Measure   Difficulty turning over in bed (including adjusting bedclothes, sheets and blankets)?: None Difficulty moving from lying on back to sitting on the side of the bed? : Unable Difficulty sitting down on and standing up from a chair with arms (e.g., wheelchair, bedside commode, etc,.)?: Unable Help needed moving to and from a bed to chair (including a wheelchair)?: A Little Help needed walking in hospital room?: A Little Help needed climbing 3-5 steps with a railing? : A Little 6 Click Score: 15    End of Session Equipment Utilized During Treatment: Gait belt Activity Tolerance: Patient limited by pain Patient left: in bed;with call bell/phone within reach Nurse Communication: Mobility status PT Visit Diagnosis: Other abnormalities of gait and mobility (R26.89);Pain Pain - Right/Left: Right Pain - part of body: Hip     Time: 8786-7672 PT Time Calculation (min) (ACUTE ONLY): 29 min  Charges:  $Gait Training: 8-22 mins $Therapeutic Exercise: 8-22 mins                    G Codes:       [place], [place], [place] 11/19/2017, 4:33 PM

## 2017-11-19 NOTE — Plan of Care (Signed)
  Problem: Education: Goal: Knowledge of General Education information will improve Outcome: Progressing   Problem: Clinical Measurements: Goal: Ability to maintain clinical measurements within normal limits will improve Outcome: Progressing   Problem: Activity: Goal: Risk for activity intolerance will decrease Outcome: Progressing   Problem: Elimination: Goal: Will not experience complications related to bowel motility Outcome: Progressing   Problem: Pain Managment: Goal: General experience of comfort will improve Outcome: Progressing   

## 2017-11-19 NOTE — Progress Notes (Signed)
Subjective: 2 Days Post-Op Procedure(s) (LRB): RIGHT TOTAL HIP ARTHROPLASTY ANTERIOR APPROACH (Right) Patient reports pain as mild.  Making great progress with PT  Objective: Vital signs in last 24 hours: Temp:  [97.9 F (36.6 C)-98.2 F (36.8 C)] 97.9 F (36.6 C) (05/24 0530) Pulse Rate:  [73-91] 73 (05/24 0530) Resp:  [15-19] 15 (05/24 0530) BP: (113-125)/(60-73) 113/60 (05/24 0530) SpO2:  [95 %-97 %] 97 % (05/24 0530)  Intake/Output from previous day: 05/23 0701 - 05/24 0700 In: 960 [P.O.:960] Out: 2000 [Urine:2000] Intake/Output this shift: No intake/output data recorded.  Recent Labs    11/18/17 0402  HGB 9.0*   Recent Labs    11/18/17 0402  WBC 12.0*  RBC 3.04*  HCT 28.7*  PLT 209   Recent Labs    11/18/17 0402  NA 138  K 4.8  CL 104  CO2 27  BUN 15  CREATININE 0.83  GLUCOSE 217*  CALCIUM 8.1*   No results for input(s): LABPT, INR in the last 72 hours.  Neurologically intact Neurovascular intact Sensation intact distally Intact pulses distally Dorsiflexion/Plantar flexion intact Incision: scant drainage No cellulitis present Compartment soft    Assessment/Plan: 2 Days Post-Op Procedure(s) (LRB): RIGHT TOTAL HIP ARTHROPLASTY ANTERIOR APPROACH (Right) Advance diet Up with therapy Discharge home with home health Sunday WBAT RLE-no precautions    Brian [name] 11/19/2017, 7:57 AM

## 2017-11-20 NOTE — Plan of Care (Signed)
  Problem: Nutrition: Goal: Adequate nutrition will be maintained Outcome: Progressing   Problem: Elimination: Goal: Will not experience complications related to bowel motility Outcome: Progressing   Problem: Pain Managment: Goal: General experience of comfort will improve Outcome: Progressing   Problem: Safety: Goal: Ability to remain free from injury will improve Outcome: Progressing   

## 2017-11-20 NOTE — Progress Notes (Signed)
Subjective: 3 Days Post-Op Procedure(s) (LRB): RIGHT TOTAL HIP ARTHROPLASTY ANTERIOR APPROACH (Right) Patient reports pain as moderate.  No complaints.   Objective: Vital signs in last 24 hours: Temp:  [97.7 F (36.5 C)-98.2 F (36.8 C)] 98.2 F (36.8 C) (05/25 5462) Pulse Rate:  [78-112] 78 (05/25 0608) BP: (113-166)/(62-106) 113/63 (05/25 0608) SpO2:  [94 %-100 %] 94 % (05/25 0608)  Intake/Output from previous day: 05/24 0701 - 05/25 0700 In: 600 [P.O.:600] Out: 2350 [Urine:2350] Intake/Output this shift: No intake/output data recorded.  Recent Labs    11/18/17 0402  HGB 9.0*   Recent Labs    11/18/17 0402  WBC 12.0*  RBC 3.04*  HCT 28.7*  PLT 209   Recent Labs    11/18/17 0402  NA 138  K 4.8  CL 104  CO2 27  BUN 15  CREATININE 0.83  GLUCOSE 217*  CALCIUM 8.1*   No results for input(s): LABPT, INR in the last 72 hours.  Intact pulses distally Dorsiflexion/Plantar flexion intact Incision: dressing C/D/I Compartment soft    Assessment/Plan: 3 Days Post-Op Procedure(s) (LRB): RIGHT TOTAL HIP ARTHROPLASTY ANTERIOR APPROACH (Right) Up with therapy  Plan disharge home tomorrow.     Brian [name] 11/20/2017, 8:51 AM

## 2017-11-20 NOTE — Progress Notes (Signed)
Physical Therapy Treatment Patient Details Name: Brian [name] MRN: [id number] DOB: September 30, 1950 Today's Date: 11/20/2017    History of Present Illness Pt is a 67 y/o male s/p R THA, direct anterior approach. PMH including but not limited to CHF, HTN, L TKA on 07/22/17.    PT Comments    Pt making steady progress with functional mobility. Pt tolerated bilateral LE strengthening exercises in standing this session and required less physical assistance. Plan is for pt to d/c home tomorrow. He is ready to d/c from a PT perspective.  Pt would continue to benefit from skilled physical therapy services at this time while admitted and after d/c to address the below listed limitations in order to improve overall safety and independence with functional mobility.    Follow Up Recommendations  Home health PT;Supervision/Assistance - 24 hour     Equipment Recommendations  None recommended by PT    Recommendations for Other Services       Precautions / Restrictions Precautions Precautions: Fall Restrictions Weight Bearing Restrictions: Yes RLE Weight Bearing: Weight bearing as tolerated    Mobility  Bed Mobility Overal bed mobility: Needs Assistance Bed Mobility: Supine to Sit;Sit to Supine     Supine to sit: Min guard Sit to supine: Min assist   General bed mobility comments: increased time and effort, use of bed rails, min A for R LE movement back onto bed  Transfers Overall transfer level: Needs assistance Equipment used: Rolling walker (2 wheeled) Transfers: Sit to/from Stand Sit to Stand: From elevated surface;Min guard         General transfer comment: increased time and effort, min guard for safety  Ambulation/Gait Ambulation/Gait assistance: Min guard Ambulation Distance (Feet): 150 Feet Assistive device: Rolling walker (2 wheeled) Gait Pattern/deviations: Step-through pattern;Decreased step length - right;Decreased step length - left;Decreased stride length Gait  velocity: decreased Gait velocity interpretation: 1.31 - 2.62 ft/sec, indicative of limited community ambulator General Gait Details: no instability or LOB, min guard for safety with RW   Stairs             Wheelchair Mobility    Modified Rankin (Stroke Patients Only)       Balance Overall balance assessment: Needs assistance Sitting-balance support: Feet supported Sitting balance-Leahy Scale: Good     Standing balance support: During functional activity;Bilateral upper extremity supported Standing balance-Leahy Scale: Poor                              Cognition Arousal/Alertness: Awake/alert Behavior During Therapy: Anxious Overall Cognitive Status: Within Functional Limits for tasks assessed                                        Exercises Total Joint Exercises Hip ABduction/ADduction: AROM;Strengthening;Right;20 reps;Standing Knee Flexion: AROM;Strengthening;Right;10 reps;Standing Marching in Standing: AROM;Both;10 reps;Standing Standing Hip Extension: AROM;Strengthening;Right;10 reps General Exercises - Lower Extremity Mini-Sqauts: AROM;Strengthening;10 reps;Standing    General Comments        Pertinent Vitals/Pain Pain Assessment: Faces Faces Pain Scale: Hurts even more Pain Location: R hip Pain Descriptors / Indicators: Burning;Sharp Pain Intervention(s): Monitored during session;Repositioned    Home Living                      Prior Function            PT Goals (current goals  can now be found in the care plan section) Acute Rehab PT Goals PT Goal Formulation: With patient Time For Goal Achievement: 12/02/17 Potential to Achieve Goals: Good Progress towards PT goals: Progressing toward goals    Frequency    7X/week      PT Plan Current plan remains appropriate    Co-evaluation              AM-PAC PT "6 Clicks" Daily Activity  Outcome Measure  Difficulty turning over in bed (including  adjusting bedclothes, sheets and blankets)?: None Difficulty moving from lying on back to sitting on the side of the bed? : Unable Difficulty sitting down on and standing up from a chair with arms (e.g., wheelchair, bedside commode, etc,.)?: Unable Help needed moving to and from a bed to chair (including a wheelchair)?: A Little Help needed walking in hospital room?: A Little Help needed climbing 3-5 steps with a railing? : A Little 6 Click Score: 15    End of Session Equipment Utilized During Treatment: Gait belt Activity Tolerance: Patient limited by pain Patient left: in bed;with call bell/phone within reach Nurse Communication: Mobility status PT Visit Diagnosis: Other abnormalities of gait and mobility (R26.89);Pain Pain - Right/Left: Right Pain - part of body: Hip     Time: 9390-3009 PT Time Calculation (min) (ACUTE ONLY): 18 min  Charges:  $Therapeutic Activity: 8-22 mins                    G Codes:       [place], [place], [place] 11/20/2017, 11:17 AM

## 2017-11-21 LAB — GLUCOSE, CAPILLARY: GLUCOSE-CAPILLARY: 100 mg/dL — AB (ref 65–99)

## 2017-11-21 NOTE — Progress Notes (Signed)
Subjective: 4 Days Post-Op Procedure(s) (LRB): RIGHT TOTAL HIP ARTHROPLASTY ANTERIOR APPROACH (Right) Patient reports pain as mild.  No complaints.  Objective: Vital signs in last 24 hours: Temp:  [97.6 F (36.4 C)-98.3 F (36.8 C)] 97.6 F (36.4 C) (05/26 0277) Pulse Rate:  [86-97] 86 (05/26 0632) Resp:  [18] 18 (05/25 1433) BP: (118-122)/(69-78) 122/74 (05/26 4128) SpO2:  [95 %-97 %] 97 % (05/26 7867)  Intake/Output from previous day: 05/25 0701 - 05/26 0700 In: 240 [P.O.:240] Out: 1410 [Urine:1410] Intake/Output this shift: No intake/output data recorded.  No results for input(s): HGB in the last 72 hours. No results for input(s): WBC, RBC, HCT, PLT in the last 72 hours. No results for input(s): NA, K, CL, CO2, BUN, CREATININE, GLUCOSE, CALCIUM in the last 72 hours. No results for input(s): LABPT, INR in the last 72 hours.  Right lower extremity: Dorsiflexion/Plantar flexion intact Incision: dressing C/D/I Compartment soft    Assessment/Plan: 4 Days Post-Op Procedure(s) (LRB): RIGHT TOTAL HIP ARTHROPLASTY ANTERIOR APPROACH (Right) Discharge home with home health today.     GILBERT CLARK 11/21/2017, 8:41 AM

## 2017-11-21 NOTE — Discharge Summary (Signed)
Patient ID: Brian [name] MRN: [id number] DOB/AGE: 1950/12/02 67 y.o.  Admit date: 11/17/2017 Discharge date: 11/21/2017  Admission Diagnoses:  Active Problems:   History of hip replacement   Discharge Diagnoses:  Same  Past Medical History:  Diagnosis Date  . Anemia   . Anxiety   . Atrial flutter ([place])    a. 08/2012  . CHF (congestive heart failure) ([place])   . Dysrhythmia   . History of gout   . History of kidney stones    passed  . Hypertension   . Obesity   . Pneumonia 09/2016  . Rheumatoid arthritis ([place])    "a little bit qwhere" (07/22/2017)  . Shortness of breath    witrh exterion  . Tobacco abuse     Surgeries: Procedure(s): RIGHT TOTAL HIP ARTHROPLASTY ANTERIOR APPROACH on 11/17/2017   Consultants:   Discharged Condition: Improved  Hospital Course: Brian [name] is an 67 y.o. male who was admitted 11/17/2017 for operative treatment of<principal problem not specified>. Patient has severe unremitting pain that affects sleep, daily activities, and work/hobbies. After pre-op clearance the patient was taken to the operating room on 11/17/2017 and underwent  Procedure(s): RIGHT TOTAL HIP ARTHROPLASTY ANTERIOR APPROACH.    Patient was given perioperative antibiotics:  Anti-infectives (From admission, onward)   Start     Dose/Rate Route Frequency Ordered Stop   11/18/17 1800  cephALEXin (KEFLEX) capsule 500 mg     500 mg Oral Every 6 hours 11/17/17 1805     11/18/17 0000  cephALEXin (KEFLEX) 500 MG capsule     500 mg Oral 4 times daily 11/18/17 1147     11/17/17 2200  sulfamethoxazole-trimethoprim (BACTRIM DS,SEPTRA DS) 800-160 MG per tablet 1 tablet  Status:  Discontinued     1 tablet Oral Every 12 hours 11/17/17 1605 11/17/17 1803   11/17/17 1700  ceFAZolin (ANCEF) IVPB 2g/100 mL premix     2 g 200 mL/hr over 30 Minutes Intravenous Every 6 hours 11/17/17 1605 11/18/17 0457   11/17/17 1110  vancomycin (VANCOCIN) powder  Status:  Discontinued       As needed  11/17/17 1158 11/17/17 1330   11/17/17 0815  ceFAZolin (ANCEF) IVPB 2g/100 mL premix     2 g 200 mL/hr over 30 Minutes Intravenous On call to O.R. 11/17/17 0810 11/17/17 1142   11/17/17 0000  sulfamethoxazole-trimethoprim (BACTRIM DS,SEPTRA DS) 800-160 MG tablet     1 tablet Oral 2 times daily 11/17/17 1309         Patient was given sequential compression devices, early ambulation, and chemoprophylaxis to prevent DVT.  Patient benefited maximally from hospital stay and there were no complications.    Recent vital signs:  Patient Vitals for the past 24 hrs:  BP Temp Temp src Pulse Resp SpO2  11/21/17 0632 122/74 97.6 F (36.4 C) Oral 86 - 97 %  11/20/17 2038 121/78 98.2 F (36.8 C) Oral 97 - 95 %  11/20/17 1433 118/69 98.3 F (36.8 C) Oral 97 18 97 %     Recent laboratory studies: No results for input(s): WBC, HGB, HCT, PLT, NA, K, CL, CO2, BUN, CREATININE, GLUCOSE, INR, CALCIUM in the last 72 hours.  Invalid input(s): PT, 2   Discharge Medications:   Allergies as of 11/21/2017      Reactions   Tape Rash, Other (See Comments)   Paper tape is preferred, please!!      Medication List    STOP taking these medications   HYDROcodone-acetaminophen 5-325 MG  tablet Commonly known as:  NORCO   traMADol 50 MG tablet Commonly known as:  ULTRAM     TAKE these medications   cephALEXin 500 MG capsule Commonly known as:  KEFLEX Take 1 capsule (500 mg total) by mouth 4 (four) times daily. What changed:  when to take this   docusate sodium 100 MG capsule Commonly known as:  COLACE Take 300 mg by mouth daily.   famotidine 20 MG tablet Commonly known as:  PEPCID Take 1 tablet (20 mg total) by mouth at bedtime.   folic acid 1 MG tablet Commonly known as:  FOLVITE Take 1 mg by mouth daily.   furosemide 40 MG tablet Commonly known as:  LASIX Take 1 tablet (40 mg total) by mouth daily. Please call and schedule an appt for further refills, 1st attempt What changed:    how  much to take  when to take this  additional instructions   HUMIRA 40 MG/0.4ML Pskt Generic drug:  Adalimumab Inject 40 mg into the skin every 14 (fourteen) days.   methocarbamol 750 MG tablet Commonly known as:  ROBAXIN Take 1 tablet (750 mg total) by mouth 2 (two) times daily as needed for muscle spasms. What changed:    medication strength  how much to take  when to take this   methotrexate 2.5 MG tablet Commonly known as:  RHEUMATREX Take 20 mg by mouth every Friday. In the morning.   metoprolol succinate 25 MG 24 hr tablet Commonly known as:  TOPROL XL Take 1 tablet (25 mg total) 2 (two) times daily by mouth.   mupirocin ointment 2 % Commonly known as:  BACTROBAN Apply to affected area 3 times daily 5 days pre-op   ondansetron 4 MG tablet Commonly known as:  ZOFRAN Take 1-2 tablets (4-8 mg total) by mouth every 8 (eight) hours as needed for nausea or vomiting.   oxyCODONE 5 MG immediate release tablet Commonly known as:  Oxy IR/ROXICODONE Take 1-3 tablets (5-15 mg total) by mouth every 4 (four) hours as needed. What changed:  Another medication with the same name was removed. Continue taking this medication, and follow the directions you see here.   potassium chloride SA 20 MEQ tablet Commonly known as:  K-DUR,KLOR-CON Take 1 tablet (20 mEq total) by mouth daily.   promethazine 25 MG tablet Commonly known as:  PHENERGAN Take 1 tablet (25 mg total) by mouth every 6 (six) hours as needed for nausea. What changed:  when to take this   ranitidine 150 MG tablet Commonly known as:  ZANTAC Take 150 mg by mouth daily.   rivaroxaban 20 MG Tabs tablet Commonly known as:  XARELTO Take 1 tablet (20 mg total) by mouth daily with supper. What changed:  when to take this   senna-docusate 8.6-50 MG tablet Commonly known as:  SENOKOT S Take 1 tablet by mouth at bedtime as needed.   sulfamethoxazole-trimethoprim 800-160 MG tablet Commonly known as:  BACTRIM  DS,SEPTRA DS Take 1 tablet by mouth 2 (two) times daily.     ASK your doctor about these medications   oxyCODONE 10 mg 12 hr tablet Commonly known as:  OXYCONTIN Take 1 tablet (10 mg total) by mouth every 12 (twelve) hours for 3 days. Ask about: Should I take this medication?            Durable Medical Equipment  (From admission, onward)        Start     Ordered   11/17/17 1606  DME Walker rolling  Once    Question:  Patient needs a walker to treat with the following condition  Answer:  History of hip replacement   11/17/17 1605   11/17/17 1606  DME 3 n 1  Once     11/17/17 1605   11/17/17 1606  DME Bedside commode  Once    Question:  Patient needs a bedside commode to treat with the following condition  Answer:  History of hip replacement   11/17/17 1605      Diagnostic Studies: Dg Pelvis Portable  Result Date: 11/17/2017 CLINICAL DATA:  Status post right hip replacement EXAM: PORTABLE PELVIS 1-2 VIEWS COMPARISON:  None. FINDINGS: Right hip replacement is noted. No acute bony abnormality is seen. Severe degenerative changes of the left hip joint are noted. IMPRESSION: Right hip replacement without acute abnormality. Electronically Signed   By: Inez Catalina M.D.   On: 11/17/2017 14:52   Dg C-arm 1-60 Min  Result Date: 11/17/2017 CLINICAL DATA:  Right total hip arthroplasty via anterior approach. EXAM: DG C-ARM 61-120 MIN; OPERATIVE RIGHT HIP WITH PELVIS COMPARISON:  None. FINDINGS: 46 seconds of fluoroscopic time was utilized. Two images intraoperatively were acquired status post right uncemented total hip arthroplasty. No immediate intraoperative complications. Fine bony detail is limited by the C-arm fluoroscopic technique. Alignment appears near anatomic. IMPRESSION: Fluoroscopic time utilized for right hip arthroplasty as above. Electronically Signed   By: Ashley Royalty M.D.   On: 11/17/2017 13:39   Dg Hip Operative Unilat W Or W/o Pelvis Right  Result Date:  11/17/2017 CLINICAL DATA:  Right total hip arthroplasty via anterior approach. EXAM: DG C-ARM 61-120 MIN; OPERATIVE RIGHT HIP WITH PELVIS COMPARISON:  None. FINDINGS: 46 seconds of fluoroscopic time was utilized. Two images intraoperatively were acquired status post right uncemented total hip arthroplasty. No immediate intraoperative complications. Fine bony detail is limited by the C-arm fluoroscopic technique. Alignment appears near anatomic. IMPRESSION: Fluoroscopic time utilized for right hip arthroplasty as above. Electronically Signed   By: Ashley Royalty M.D.   On: 11/17/2017 13:39    Disposition: Discharge disposition: 01-Home or Self Care         Follow-up Information    Leandrew Koyanagi, MD In 2 weeks.   Specialty:  Orthopedic Surgery Why:  For suture removal, For wound re-check Contact information: [place] [id number] [phone]        [facility] up.   Specialty:  [place] Why:  A representative from [facility] will contact you to arrange start date and time for your therapy. Contact information: [address] [place] [address] [phone]            Signed: Erskine Emery 11/21/2017, 8:54 AM

## 2017-11-21 NOTE — Plan of Care (Signed)

## 2017-11-21 NOTE — Progress Notes (Signed)
Provided discharge education/instructions, all questions and concerns addressed, discharged home with belongings accompanied by friend.

## 2017-11-21 NOTE — Progress Notes (Signed)
Physical Therapy Treatment Patient Details Name: Brian [name] MRN: [id number] DOB: 12-16-1950 Today's Date: 11/21/2017    History of Present Illness Pt is a 67 y/o male s/p R THA, direct anterior approach. PMH including but not limited to CHF, HTN, L TKA on 07/22/17.    PT Comments    Pt reporting that getting his leg into and out of bed is the hardest thing form him right now, so we practiced bed mobility using leg lifter (given to pt) and he was able with extra time and concerted effort to get himself into and out of bed with just the leg lifter.  He plans to d/c later today and did not feel he needed to practice stairs again.  PT to follow acutely until d/c confirmed.     Follow Up Recommendations  Home health PT;Supervision/Assistance - 24 hour     Equipment Recommendations  None recommended by PT    Recommendations for Other Services   NA     Precautions / Restrictions Restrictions RLE Weight Bearing: Weight bearing as tolerated    Mobility  Bed Mobility Overal bed mobility: Needs Assistance Bed Mobility: Supine to Sit;Sit to Supine     Supine to sit: Supervision;HOB elevated Sit to supine: Supervision   General bed mobility comments: Pt given leg lifter and he was better able to manage moving his leg into and out of bed on his own.   Transfers Overall transfer level: Needs assistance Equipment used: Rolling walker (2 wheeled) Transfers: Sit to/from Stand Sit to Stand: Supervision         General transfer comment: supervision for safety, verbal cues for safe hand placement.   Ambulation/Gait Ambulation/Gait assistance: Min guard Ambulation Distance (Feet): 75 Feet Assistive device: Rolling walker (2 wheeled) Gait Pattern/deviations: Step-through pattern;Antalgic     General Gait Details: Pt with mildly antalgic gait pattern, improved with increased distance.  Safe RW use.           Balance Overall balance assessment: Needs  assistance Sitting-balance support: Feet supported;Bilateral upper extremity supported Sitting balance-Leahy Scale: Good     Standing balance support: Bilateral upper extremity supported Standing balance-Leahy Scale: Fair                              Cognition Arousal/Alertness: Awake/alert Behavior During Therapy: WFL for tasks assessed/performed Overall Cognitive Status: Within Functional Limits for tasks assessed                                               Pertinent Vitals/Pain Pain Assessment: Faces Faces Pain Scale: Hurts even more Pain Location: R hip Pain Descriptors / Indicators: Burning;Sharp Pain Intervention(s): Limited activity within patient's tolerance;Monitored during session;Repositioned           PT Goals (current goals can now be found in the care plan section) Acute Rehab PT Goals Patient Stated Goal: decrease pain and go home today Progress towards PT goals: Progressing toward goals    Frequency    7X/week      PT Plan Current plan remains appropriate       AM-PAC PT "6 Clicks" Daily Activity  Outcome Measure  Difficulty turning over in bed (including adjusting bedclothes, sheets and blankets)?: None Difficulty moving from lying on back to sitting on the side of the bed? : A  Lot Difficulty sitting down on and standing up from a chair with arms (e.g., wheelchair, bedside commode, etc,.)?: None Help needed moving to and from a bed to chair (including a wheelchair)?: A Little Help needed walking in hospital room?: A Little Help needed climbing 3-5 steps with a railing? : A Little 6 Click Score: 19    End of Session   Activity Tolerance: Patient limited by pain;Patient limited by fatigue Patient left: in bed;with call bell/phone within reach;with family/visitor present Nurse Communication: Mobility status PT Visit Diagnosis: Other abnormalities of gait and mobility (R26.89);Pain Pain - Right/Left: Right Pain  - part of body: Hip     Time: 1610-9604 PT Time Calculation (min) (ACUTE ONLY): 16 min  Charges:  $Therapeutic Activity: 8-22 mins          Hetty Linhart B. [place], [place], DPT [phone]            11/21/2017, 4:11 PM

## 2017-11-22 DIAGNOSIS — Z471 Aftercare following joint replacement surgery: Secondary | ICD-10-CM | POA: Diagnosis not present

## 2017-11-22 DIAGNOSIS — D649 Anemia, unspecified: Secondary | ICD-10-CM | POA: Diagnosis not present

## 2017-11-22 DIAGNOSIS — F419 Anxiety disorder, unspecified: Secondary | ICD-10-CM | POA: Diagnosis not present

## 2017-11-22 DIAGNOSIS — I110 Hypertensive heart disease with heart failure: Secondary | ICD-10-CM | POA: Diagnosis not present

## 2017-11-22 DIAGNOSIS — I5032 Chronic diastolic (congestive) heart failure: Secondary | ICD-10-CM | POA: Diagnosis not present

## 2017-11-22 DIAGNOSIS — I481 Persistent atrial fibrillation: Secondary | ICD-10-CM | POA: Diagnosis not present

## 2017-11-23 ENCOUNTER — Telehealth (INDEPENDENT_AMBULATORY_CARE_PROVIDER_SITE_OTHER): Payer: Self-pay

## 2017-11-23 DIAGNOSIS — I481 Persistent atrial fibrillation: Secondary | ICD-10-CM | POA: Diagnosis not present

## 2017-11-23 DIAGNOSIS — I5032 Chronic diastolic (congestive) heart failure: Secondary | ICD-10-CM | POA: Diagnosis not present

## 2017-11-23 DIAGNOSIS — F419 Anxiety disorder, unspecified: Secondary | ICD-10-CM | POA: Diagnosis not present

## 2017-11-23 DIAGNOSIS — I110 Hypertensive heart disease with heart failure: Secondary | ICD-10-CM | POA: Diagnosis not present

## 2017-11-23 DIAGNOSIS — D649 Anemia, unspecified: Secondary | ICD-10-CM | POA: Diagnosis not present

## 2017-11-23 DIAGNOSIS — Z471 Aftercare following joint replacement surgery: Secondary | ICD-10-CM | POA: Diagnosis not present

## 2017-11-23 LAB — GLUCOSE, CAPILLARY: GLUCOSE-CAPILLARY: 120 mg/dL — AB (ref 65–99)

## 2017-11-23 NOTE — Telephone Encounter (Signed)
Spoke with Brian [name] - ok to continue PT and other current measures. Dr. Erlinda Hong will see the patient back in the office next Thurs. 6/06 and will reassess then.

## 2017-11-23 NOTE — Telephone Encounter (Signed)
Ok thanks 

## 2017-11-23 NOTE — Telephone Encounter (Signed)
Please advise 

## 2017-11-23 NOTE — Telephone Encounter (Signed)
Flora with [facility] called stating that patient is having a lot of pain in his right hip.  Stated no redness, but has tenderness in the right hip down to his right leg and swelling.  Patient had surgery on 10/28/2017.   Patient did take 2 Oxycodone to help with the pain.  Cb# is [phone] for Dianah Field, patient CB# is [phone].  Please advise.  Thank you.

## 2017-11-25 ENCOUNTER — Telehealth (INDEPENDENT_AMBULATORY_CARE_PROVIDER_SITE_OTHER): Payer: Self-pay | Admitting: *Deleted

## 2017-11-25 NOTE — Telephone Encounter (Signed)
Please advise 

## 2017-11-25 NOTE — Telephone Encounter (Signed)
Pt called stating he is having extended swelling and is not going down seems to be increasing and was told by PA to call if having swelling or pain and she will see what can do, I advised pt I can schedule him to come in and he stated he could not get out of bed to come in and he has appt coming up on 12/02/17. Pt would like a call back from either [place] or [place].   Please advise  CB# [phone]

## 2017-11-25 NOTE — Telephone Encounter (Signed)
Per Dr Erlinda Hong Oxycodone 1-2 tabs every 6 hours prn pain #30

## 2017-11-26 ENCOUNTER — Other Ambulatory Visit (INDEPENDENT_AMBULATORY_CARE_PROVIDER_SITE_OTHER): Payer: Self-pay

## 2017-11-26 DIAGNOSIS — F419 Anxiety disorder, unspecified: Secondary | ICD-10-CM | POA: Diagnosis not present

## 2017-11-26 DIAGNOSIS — I110 Hypertensive heart disease with heart failure: Secondary | ICD-10-CM | POA: Diagnosis not present

## 2017-11-26 DIAGNOSIS — I5032 Chronic diastolic (congestive) heart failure: Secondary | ICD-10-CM | POA: Diagnosis not present

## 2017-11-26 DIAGNOSIS — I481 Persistent atrial fibrillation: Secondary | ICD-10-CM | POA: Diagnosis not present

## 2017-11-26 DIAGNOSIS — D649 Anemia, unspecified: Secondary | ICD-10-CM | POA: Diagnosis not present

## 2017-11-26 DIAGNOSIS — Z471 Aftercare following joint replacement surgery: Secondary | ICD-10-CM | POA: Diagnosis not present

## 2017-11-26 MED ORDER — OXYCODONE HCL 5 MG PO TABS: ORAL_TABLET | ORAL | 0 refills | Status: DC

## 2017-11-26 NOTE — Telephone Encounter (Signed)
Rx ready for pick up at the front desk

## 2017-11-30 DIAGNOSIS — Z471 Aftercare following joint replacement surgery: Secondary | ICD-10-CM | POA: Diagnosis not present

## 2017-11-30 DIAGNOSIS — I5032 Chronic diastolic (congestive) heart failure: Secondary | ICD-10-CM | POA: Diagnosis not present

## 2017-11-30 DIAGNOSIS — D649 Anemia, unspecified: Secondary | ICD-10-CM | POA: Diagnosis not present

## 2017-11-30 DIAGNOSIS — I481 Persistent atrial fibrillation: Secondary | ICD-10-CM | POA: Diagnosis not present

## 2017-11-30 DIAGNOSIS — F419 Anxiety disorder, unspecified: Secondary | ICD-10-CM | POA: Diagnosis not present

## 2017-11-30 DIAGNOSIS — I110 Hypertensive heart disease with heart failure: Secondary | ICD-10-CM | POA: Diagnosis not present

## 2017-12-02 ENCOUNTER — Ambulatory Visit (INDEPENDENT_AMBULATORY_CARE_PROVIDER_SITE_OTHER): Payer: Medicare Other | Admitting: Orthopaedic Surgery

## 2017-12-02 ENCOUNTER — Encounter (INDEPENDENT_AMBULATORY_CARE_PROVIDER_SITE_OTHER): Payer: Self-pay | Admitting: Orthopaedic Surgery

## 2017-12-02 DIAGNOSIS — M1611 Unilateral primary osteoarthritis, right hip: Secondary | ICD-10-CM

## 2017-12-02 NOTE — Progress Notes (Signed)
Patient is two-week status post right total hip replacement.  He is overall doing well.  He has 1 more session of home physical therapy.  He is progressing well.  He is happy with his improvement and progress.  His sutures were removed today.  Leg lengths are equivalent.  He is ambulating with a cane.  I will see him back in 4 weeks with AP pelvis standing.  He may resume his Humira injections for rheumatoid arthritis at this point.  Questions encouraged and answered.  Follow-up in 4 weeks.

## 2017-12-03 DIAGNOSIS — Z471 Aftercare following joint replacement surgery: Secondary | ICD-10-CM | POA: Diagnosis not present

## 2017-12-03 DIAGNOSIS — I481 Persistent atrial fibrillation: Secondary | ICD-10-CM | POA: Diagnosis not present

## 2017-12-03 DIAGNOSIS — D649 Anemia, unspecified: Secondary | ICD-10-CM | POA: Diagnosis not present

## 2017-12-03 DIAGNOSIS — I5032 Chronic diastolic (congestive) heart failure: Secondary | ICD-10-CM | POA: Diagnosis not present

## 2017-12-03 DIAGNOSIS — I110 Hypertensive heart disease with heart failure: Secondary | ICD-10-CM | POA: Diagnosis not present

## 2017-12-03 DIAGNOSIS — F419 Anxiety disorder, unspecified: Secondary | ICD-10-CM | POA: Diagnosis not present

## 2018-01-04 ENCOUNTER — Ambulatory Visit (INDEPENDENT_AMBULATORY_CARE_PROVIDER_SITE_OTHER): Payer: Medicare Other | Admitting: Orthopaedic Surgery

## 2018-01-04 ENCOUNTER — Ambulatory Visit (INDEPENDENT_AMBULATORY_CARE_PROVIDER_SITE_OTHER): Payer: Medicare Other

## 2018-01-04 ENCOUNTER — Encounter (INDEPENDENT_AMBULATORY_CARE_PROVIDER_SITE_OTHER): Payer: Self-pay | Admitting: Orthopaedic Surgery

## 2018-01-04 DIAGNOSIS — Z96641 Presence of right artificial hip joint: Secondary | ICD-10-CM | POA: Diagnosis not present

## 2018-01-04 MED ORDER — TRAMADOL HCL 50 MG PO TABS: ORAL_TABLET | ORAL | 0 refills | Status: DC

## 2018-01-04 NOTE — Progress Notes (Signed)
6 week THA follow up plan  Patient presents for follow up 6 weeks status post total hip replacement. The wound is healed and there is no evidence of infection. TED hose may be discontinued. Radiographs reveal a total hip arthroplasty in good position, with no evidence of subsidence, loosening, or complicating features. It was reinforced that with any procedure including dental work, colonoscopy, or any invasive procedure that pre-procedural prophylactic antibiotics must be taken to decrease the risk of infection. Reminders were given about signs to be aware of including redness, drainage, increased pain, fevers, calf pain, shortness of breath, or any concern should generate a phone call or a return to see [place] immediately. Will plan to follow up at 3 months postoperatively for next evaluation with radiographs at that time. We will also discuss left total hip replacement if he has sufficiently recovered from the right total hip replacement and left total knee replacement.

## 2018-01-26 ENCOUNTER — Telehealth (INDEPENDENT_AMBULATORY_CARE_PROVIDER_SITE_OTHER): Payer: Self-pay | Admitting: *Deleted

## 2018-01-26 NOTE — Telephone Encounter (Signed)
Pt called would like a refill on Tramadol, states was in couple weeks ago and now is out of medication, he does not come back in for follow up until Aug 20th.   Please advise  [facility] [place].

## 2018-01-26 NOTE — Telephone Encounter (Signed)
Please advise 

## 2018-01-26 NOTE — Telephone Encounter (Signed)
30

## 2018-01-27 ENCOUNTER — Other Ambulatory Visit (INDEPENDENT_AMBULATORY_CARE_PROVIDER_SITE_OTHER): Payer: Self-pay

## 2018-01-27 MED ORDER — TRAMADOL HCL 50 MG PO TABS: 50.0000 mg | ORAL_TABLET | Freq: Two times a day (BID) | ORAL | 0 refills | Status: DC

## 2018-01-27 NOTE — Telephone Encounter (Signed)
Called into pharm  

## 2018-02-12 DIAGNOSIS — N390 Urinary tract infection, site not specified: Secondary | ICD-10-CM | POA: Diagnosis not present

## 2018-02-12 DIAGNOSIS — R319 Hematuria, unspecified: Secondary | ICD-10-CM | POA: Diagnosis not present

## 2018-02-15 ENCOUNTER — Encounter (INDEPENDENT_AMBULATORY_CARE_PROVIDER_SITE_OTHER): Payer: Self-pay | Admitting: Orthopaedic Surgery

## 2018-02-15 ENCOUNTER — Ambulatory Visit (INDEPENDENT_AMBULATORY_CARE_PROVIDER_SITE_OTHER): Payer: Medicare Other

## 2018-02-15 ENCOUNTER — Ambulatory Visit (INDEPENDENT_AMBULATORY_CARE_PROVIDER_SITE_OTHER): Payer: Medicare Other | Admitting: Orthopaedic Surgery

## 2018-02-15 VITALS — Ht 69.0 in | Wt 233.0 lb

## 2018-02-15 DIAGNOSIS — M1612 Unilateral primary osteoarthritis, left hip: Secondary | ICD-10-CM

## 2018-02-15 DIAGNOSIS — Z96641 Presence of right artificial hip joint: Secondary | ICD-10-CM | POA: Diagnosis not present

## 2018-02-15 NOTE — Progress Notes (Signed)
Office Visit Note   Patient: Brian [name]           Date of Birth: [birth year]           MRN: [id number] Visit Date: 02/15/2018              Requested by: Orpah Melter, MD [address] [place], [place] [address] PCP: Orpah Melter, MD   Assessment & Plan: Visit Diagnoses:  1. Status post total replacement of right hip   2. Primary osteoarthritis of left hip     Plan: Patient is doing well from his right total hip replacement.  We will see him back at her regular scheduled intervals.  From the left hip standpoint patient has failed conservative treatment and wishes to proceed with a left total hip replacement.  We will schedule this in the near future.  He is to discontinue his Xarelto 3 days preoperatively and he has been instructed not to receive his next Humira injection.  Follow-Up Instructions: Return if symptoms worsen or fail to improve.   Orders:  Orders Placed This Encounter  Procedures  . XR HIP UNILAT W OR W/O PELVIS 2-3 VIEWS RIGHT   No orders of the defined types were placed in this encounter.     Procedures: No procedures performed   Clinical Data: No additional findings.   Subjective: Chief Complaint  Patient presents with  . Right Hip - Routine Post Op    11/17/17 right total hip     Brian [name] is a 67 year old gentleman who is 90 days status post right total hip replacement.  He is doing well.  He feels that he has fully rehab from.  He is having significant pain and functional disability because of this.   Review of Systems  Constitutional: Negative.   All other systems reviewed and are negative.    Objective: Vital Signs: Ht 5\' 9"  (1.753 m)   Wt 233 lb (105.7 kg)   BMI 34.41 kg/m    Physical Exam  Constitutional: He is oriented to person, place, and time. He appears well-developed and well-nourished.  Pulmonary/Chest: Effort normal.  Abdominal: Soft.  Neurological: He is alert and oriented to person, place, and time.    Skin: Skin is warm.  Psychiatric: He has a normal mood and affect. His behavior is normal. Judgment and thought content normal.  Nursing note and vitals reviewed.   Ortho Exam Right hip exam shows a fully healed surgical scar without any evidence of infection.  Painless rotation of the hip. Left hip exam shows minimal range of motion with positive FADIR. Specialty Comments:  No specialty comments available.  Imaging: Xr Hip Unilat W Or W/o Pelvis 2-3 Views Right  Result Date: 02/15/2018 Stable right total hip replacement.  Advanced degenerative joint disease left hip.  Bone-on-bone joint space narrowing    PMFS History: Patient Active Problem List   Diagnosis Date Noted  . 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 Cellulitis 01/22/2017  . Polyarthritis 12/01/2016  . Cellulitis of left leg 11/29/2016  . Sepsis ([place]) 11/29/2016  . Effusion, left knee 11/13/2016  . Persistent atrial fibrillation ([place])   . Dyspnea   . Positive urine drug screen   . CAP (community acquired pneumonia) 09/30/2016  . Community acquired pneumonia of left lower lobe of lung ([place])   . Pleural effusion   . Tachypnea   . CVA (cerebral infarction) 09/27/2015  .  Right hand pain 09/27/2015  . History of gout 09/27/2015  . Cocaine abuse ([place]) 09/27/2015  . TIA (transient ischemic attack) 09/27/2015  . Slurred speech   . Primary osteoarthritis of right hand   . Leukocytosis   . Essential hypertension   . Chronic diastolic CHF (congestive heart failure) ([place])   . HTN (hypertension) 11/22/2013  . Chronic diastolic heart failure ([place]) 11/22/2013  . Noncompliance 11/17/2013  . Atrial flutter with rapid ventricular response ([place]) 11/16/2013  . Acute on chronic diastolic CHF (congestive heart failure) ([place]) 09/05/2012  . Tobacco abuse   . Obesity- BMI 35   . Diastolic CHF ([place])   . Atrial flutter ([place])   . Anxiety    Past Medical History:   Diagnosis Date  . Anemia   . Anxiety   . Atrial flutter ([place])    a. 08/2012  . CHF (congestive heart failure) ([place])   . Dysrhythmia   . History of gout   . History of kidney stones    passed  . Hypertension   . Obesity   . Pneumonia 09/2016  . Rheumatoid arthritis ([place])    "a little bit qwhere" (07/22/2017)  . Shortness of breath    witrh exterion  . Tobacco abuse     Family History  Problem Relation Age of Onset  . Cancer Mother   . Heart disease Father   . Heart attack Father   . Cancer Father   . Diabetes Sister     Past Surgical History:  Procedure Laterality Date  . A-FLUTTER ABLATION N/A 12/29/2016   Procedure: A-Flutter Ablation;  Surgeon: [name], MD;  Location: [facility];  Service: Cardiovascular;  Laterality: N/A;  . CARDIOVERSION N/A 11/05/2016   Procedure: CARDIOVERSION;  Surgeon: Sueanne Margarita, MD;  Location: [facility];  Service: Cardiovascular;  Laterality: N/A;  . IR THORACENTESIS ASP PLEURAL SPACE W/IMG GUIDE  10/01/2016  . JOINT REPLACEMENT    . MICROLARYNGOSCOPY  09/02/2007   with excision of right vocal cord mass, Dr. Constance Holster  . TOTAL HIP ARTHROPLASTY Right 11/17/2017  . TOTAL HIP ARTHROPLASTY Right 11/17/2017   Procedure: RIGHT TOTAL HIP ARTHROPLASTY ANTERIOR APPROACH;  Surgeon: Leandrew Koyanagi, MD;  Location: [facility];  Service: Orthopedics;  Laterality: Right;  . TOTAL KNEE ARTHROPLASTY Left 07/22/2017  . TOTAL KNEE ARTHROPLASTY Left 07/22/2017   Procedure: LEFT TOTAL KNEE ARTHROPLASTY;  Surgeon: Leandrew Koyanagi, MD;  Location: [facility];  Service: Orthopedics;  Laterality: Left;   Social History   Occupational History  . Not on file  Tobacco Use  . Smoking status: Current Every Day Smoker    Packs/day: 0.13    Years: 45.00    Pack years: 5.85    Types: Cigarettes  . Smokeless tobacco: Never Used  . Tobacco comment: "quit off and on"  Substance and Sexual Activity  . Alcohol use: Yes    Comment: 07/22/2017 "nothing since 2016"  . Drug use:  No    Comment: 07/22/2017 "none since 11/2016" cocaine (sister is not aware)  . Sexual activity: Not Currently

## 2018-02-23 ENCOUNTER — Other Ambulatory Visit: Payer: Self-pay | Admitting: Internal Medicine

## 2018-02-23 NOTE — Telephone Encounter (Signed)
Xarelto 20mg  refill request received; pt is 67 yrs old, wt-105.7kg, Crea-0.83 on 11/18/17, last seen by Roderic Palau on 03/18/17, CrCl-130.47ml/min; will send in a refill to requested pharmacy.

## 2018-03-01 ENCOUNTER — Other Ambulatory Visit (INDEPENDENT_AMBULATORY_CARE_PROVIDER_SITE_OTHER): Payer: Self-pay | Admitting: Orthopaedic Surgery

## 2018-03-01 ENCOUNTER — Encounter (HOSPITAL_COMMUNITY)
Admission: RE | Admit: 2018-03-01 | Discharge: 2018-03-01 | Disposition: A | Discharge status: Home / Self Care | Payer: Medicare Other | Source: Ambulatory Visit | Attending: Orthopaedic Surgery | Admitting: Orthopaedic Surgery

## 2018-03-01 ENCOUNTER — Other Ambulatory Visit: Payer: Self-pay

## 2018-03-01 ENCOUNTER — Encounter (HOSPITAL_COMMUNITY): Payer: Self-pay

## 2018-03-01 DIAGNOSIS — Z01818 Encounter for other preprocedural examination: Secondary | ICD-10-CM | POA: Insufficient documentation

## 2018-03-01 DIAGNOSIS — Z7901 Long term (current) use of anticoagulants: Secondary | ICD-10-CM | POA: Diagnosis not present

## 2018-03-01 DIAGNOSIS — I110 Hypertensive heart disease with heart failure: Secondary | ICD-10-CM | POA: Diagnosis not present

## 2018-03-01 DIAGNOSIS — I4892 Unspecified atrial flutter: Secondary | ICD-10-CM | POA: Diagnosis not present

## 2018-03-01 DIAGNOSIS — Z96641 Presence of right artificial hip joint: Secondary | ICD-10-CM | POA: Insufficient documentation

## 2018-03-01 DIAGNOSIS — Z6833 Body mass index (BMI) 33.0-33.9, adult: Secondary | ICD-10-CM | POA: Diagnosis not present

## 2018-03-01 DIAGNOSIS — M1612 Unilateral primary osteoarthritis, left hip: Secondary | ICD-10-CM

## 2018-03-01 DIAGNOSIS — F419 Anxiety disorder, unspecified: Secondary | ICD-10-CM | POA: Insufficient documentation

## 2018-03-01 DIAGNOSIS — I4891 Unspecified atrial fibrillation: Secondary | ICD-10-CM | POA: Diagnosis not present

## 2018-03-01 DIAGNOSIS — M109 Gout, unspecified: Secondary | ICD-10-CM | POA: Diagnosis not present

## 2018-03-01 DIAGNOSIS — M069 Rheumatoid arthritis, unspecified: Secondary | ICD-10-CM | POA: Diagnosis not present

## 2018-03-01 DIAGNOSIS — Z96652 Presence of left artificial knee joint: Secondary | ICD-10-CM | POA: Insufficient documentation

## 2018-03-01 DIAGNOSIS — Z87891 Personal history of nicotine dependence: Secondary | ICD-10-CM | POA: Insufficient documentation

## 2018-03-01 DIAGNOSIS — I5030 Unspecified diastolic (congestive) heart failure: Secondary | ICD-10-CM | POA: Insufficient documentation

## 2018-03-01 DIAGNOSIS — Z87442 Personal history of urinary calculi: Secondary | ICD-10-CM | POA: Insufficient documentation

## 2018-03-01 DIAGNOSIS — E669 Obesity, unspecified: Secondary | ICD-10-CM | POA: Diagnosis not present

## 2018-03-01 DIAGNOSIS — Z79899 Other long term (current) drug therapy: Secondary | ICD-10-CM | POA: Diagnosis not present

## 2018-03-01 DIAGNOSIS — Z0183 Encounter for blood typing: Secondary | ICD-10-CM | POA: Diagnosis not present

## 2018-03-01 DIAGNOSIS — Z01812 Encounter for preprocedural laboratory examination: Secondary | ICD-10-CM | POA: Insufficient documentation

## 2018-03-01 HISTORY — DX: Gastro-esophageal reflux disease without esophagitis: K21.9

## 2018-03-01 LAB — COMPREHENSIVE METABOLIC PANEL
ALK PHOS: 67 U/L (ref 38–126)
ALT: 20 U/L (ref 0–44)
AST: 20 U/L (ref 15–41)
Albumin: 3.5 g/dL (ref 3.5–5.0)
Anion gap: 11 (ref 5–15)
BUN: 10 mg/dL (ref 8–23)
CO2: 25 mmol/L (ref 22–32)
Calcium: 9.3 mg/dL (ref 8.9–10.3)
Chloride: 104 mmol/L (ref 98–111)
Creatinine, Ser: 0.67 mg/dL (ref 0.61–1.24)
GFR calc non Af Amer: >60 mL/min (ref >60)
GLUCOSE: 104 mg/dL — AB (ref 70–99)
Potassium: 4.4 mmol/L (ref 3.5–5.1)
SODIUM: 140 mmol/L (ref 135–145)
TOTAL PROTEIN: 8.4 g/dL — AB (ref 6.5–8.1)
Total Bilirubin: 0.3 mg/dL (ref 0.3–1.2)

## 2018-03-01 LAB — CBC WITH DIFFERENTIAL/PLATELET
ABS IMMATURE GRANULOCYTES: 0.1 10*3/uL (ref 0.0–0.1)
BASOS ABS: 0.1 10*3/uL (ref 0.0–0.1)
Basophils Relative: 1 %
Eosinophils Absolute: 0.5 10*3/uL (ref 0.0–0.7)
Eosinophils Relative: 4 %
HCT: 45.2 % (ref 39.0–52.0)
HEMOGLOBIN: 13.5 g/dL (ref 13.0–17.0)
IMMATURE GRANULOCYTES: 1 %
LYMPHS PCT: 15 %
Lymphs Abs: 2.1 10*3/uL (ref 0.7–4.0)
MCH: 27.1 pg (ref 26.0–34.0)
MCHC: 29.9 g/dL — ABNORMAL LOW (ref 30.0–36.0)
MCV: 90.6 fL (ref 78.0–100.0)
MONO ABS: 1.1 10*3/uL — AB (ref 0.1–1.0)
MONOS PCT: 8 %
NEUTROS ABS: 10.3 10*3/uL — AB (ref 1.7–7.7)
Neutrophils Relative %: 71 %
Platelets: 320 10*3/uL (ref 150–400)
RBC: 4.99 MIL/uL (ref 4.22–5.81)
RDW: 14.8 % (ref 11.5–15.5)
WBC: 14.2 10*3/uL — ABNORMAL HIGH (ref 4.0–10.5)

## 2018-03-01 LAB — PROTIME-INR
INR: 1.28
PROTHROMBIN TIME: 15.9 s — AB (ref 11.4–15.2)

## 2018-03-01 LAB — SURGICAL PCR SCREEN
MRSA, PCR: POSITIVE — AB
Staphylococcus aureus: POSITIVE — AB

## 2018-03-01 LAB — APTT: aPTT: 40 seconds — ABNORMAL HIGH (ref 24–36)

## 2018-03-01 NOTE — Progress Notes (Signed)
Needs vanc in additional to ancef.  Will they call in mupirocin for patient?

## 2018-03-01 NOTE — Progress Notes (Signed)
PCP - Anner Crete MD Cardiologist - [place]  Chest x-ray - 05/18/17 EKG - 05/19/17 ECHO - 09/2015  Blood Thinner Instructions: Per MD, stopping 3 days prior to surgery   Anesthesia review: Cardiac Clearance  Patient denies shortness of breath, fever, cough and chest pain at PAT appointment   Patient verbalized understanding of instructions that were given to them at the PAT appointment. Patient was also instructed that they will need to review over the PAT instructions again at home before surgery.

## 2018-03-01 NOTE — Pre-Procedure Instructions (Signed)
Brian [name]  03/01/2018      [id number] [place], [place] - [address] [place] [address] Phone: [phone] Fax: [phone]    Your procedure is scheduled on Wednesday September 9th.  Report to [facility] at 0800 A.M.  Call this number if you have problems the morning of surgery:  [phone]    Remember:  Do not eat or drink after midnight.     Take these medicines the morning of surgery with A SIP OF WATER   Docusate (Colace), Zantac  7 days prior to surgery STOP taking any Aspirin(unless otherwise instructed by your surgeon), Aleve, Naproxen, Ibuprofen, Motrin, Advil, Goody's, BC's, all herbal medications, fish oil, and all vitamins     Do not wear jewelry  Do not wear lotions, powders, or colognes, or deodorant.  Do not shave 48 hours prior to surgery.  Men may shave face and neck.  Do not bring valuables to the hospital.  [facility] is not responsible for any belongings or valuables.  Contacts, dentures or bridgework may not be worn into surgery.  Leave your suitcase in the car.  After surgery it may be brought to your room.  For patients admitted to the hospital, discharge time will be determined by your treatment team.  Patients discharged the day of surgery will not be allowed to drive home.    [facility]- Preparing For Surgery  Before surgery, you can play an important role. Because skin is not sterile, your skin needs to be as free of germs as possible. You can reduce the number of germs on your skin by washing with CHG (chlorahexidine gluconate) Soap before surgery.  CHG is an antiseptic cleaner which kills germs and bonds with the skin to continue killing germs even after washing.    Oral Hygiene is also important to reduce your risk of infection.  Remember - BRUSH YOUR TEETH THE MORNING OF SURGERY WITH YOUR REGULAR TOOTHPASTE  Please do not use if you  have an allergy to CHG or antibacterial soaps. If your skin becomes reddened/irritated stop using the CHG.  Do not shave (including legs and underarms) for at least 48 hours prior to first CHG shower. It is OK to shave your face.  Please follow these instructions carefully.   1. Shower the NIGHT BEFORE SURGERY and the MORNING OF SURGERY with CHG.   2. If you chose to wash your hair, wash your hair first as usual with your normal shampoo.  3. After you shampoo, rinse your hair and body thoroughly to remove the shampoo.  4. Use CHG as you would any other liquid soap. You can apply CHG directly to the skin and wash gently with a scrungie or a clean washcloth.   5. Apply the CHG Soap to your body ONLY FROM THE NECK DOWN.  Do not use on open wounds or open sores. Avoid contact with your eyes, ears, mouth and genitals (private parts). Wash Face and genitals (private parts)  with your normal soap.  6. Wash thoroughly, paying special attention to the area where your surgery will be performed.  7. Thoroughly rinse your body with warm water from the neck down.  8. DO NOT shower/wash with your normal soap after using and rinsing off the CHG Soap.  9. Pat yourself dry with a CLEAN TOWEL.  10. Wear CLEAN PAJAMAS to bed the night before surgery,  wear comfortable clothes the morning of surgery  11. Place CLEAN SHEETS on your bed the night of your first shower and DO NOT SLEEP WITH PETS.    Day of Surgery:  Do not apply any deodorants/lotions.  Please wear clean clothes to the hospital/surgery center.   Remember to brush your teeth WITH YOUR REGULAR TOOTHPASTE.    Please read over the following fact sheets that you were given. Coughing and Deep Breathing, MRSA Information and Surgical Site Infection Prevention

## 2018-03-01 NOTE — Progress Notes (Signed)
Per Heath Lark at [facility], [facility]

## 2018-03-02 NOTE — Progress Notes (Signed)
Anesthesia Chart Review:  Case:  [id number] Date/Time:  03/07/18 1002   Procedure:  LEFT TOTAL HIP ARTHROPLASTY ANTERIOR APPROACH (Left )   Anesthesia type:  Spinal   Pre-op diagnosis:  left hip degenerative joint disease   Location:  [facility] OR ROOM 04 / [facility]   Surgeon:  Leandrew Koyanagi, MD      DISCUSSION: Patient is a 67 year old male former smoker scheduled for the above procedure. He is s/p left TKA 07/22/17, Right THA 11/17/2017.  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  Pt was given cardiac clearance prior to left TKA 07/22/2017. Per telephone encounter by Bernerd Pho, PA-C 06/24/2017 "Nanci Pina has been cleared for surgery as outlined below. Can hold Xarelto 2 days prior to procedure and resume the evening of or day after the procedure at the discretion of the procedure MD. Please resume therapeutic dose (not prophylactic dosing)."  He tolerated left TKA and Right THA earlier this year. He was felt acceptable CV risk for that procedure, although has not seen cardiology since that surgery. Most recently he has been in [place], and he previously reported that he is able to tell when he is in afib. HR 100 at PAT. Per Dr. Erlinda Hong hold Xarelto 3 days prior to surgery and hold Humira 2 weeks before or 2 weeks after surgery.   If no acute changes then I anticipate that he can proceed with surgery as planned.  VS: BP (!) 142/91   Pulse 100   Temp 36.8 C   Resp 20   Ht 5\' 10"  (1.778 m)   Wt 105.7 kg   SpO2 96%   BMI 33.43 kg/m   PROVIDERS: Orpah Melter, MD is PCP  Rheumatologist is with [facility]. Has been seeing Leafy Kindle, PA-C.  Candee Furbish, MD is  Cardiologist  [name], MD is EP Cardiologist  LABS: Labs reviewed: Acceptable for surgery. Mild leukocytosis, review of previous labs shows history of similar:  Ref. Range 01/22/2017 06:15 01/23/2017 04:49 05/18/2017 10:31 07/16/2017 10:00 07/23/2017 04:12 11/05/2017 10:22 11/18/2017 04:02 03/01/2018 09:33  WBC Latest Ref Range: 4.0 - 10.5 K/uL 17.6 (H) 14.1 (H) 16.6 (H) 11.9 (H) 14.0 (H) 10.3 12.0 (H) 14.2 (H)   (all labs ordered are listed, but only abnormal results are displayed)  Labs Reviewed  SURGICAL PCR SCREEN - Abnormal; Notable for the following components:      Result Value   MRSA, PCR POSITIVE (*)    Staphylococcus aureus POSITIVE (*)    All other components within normal limits  APTT - Abnormal; Notable for the following components:   aPTT 40 (*)    All other components within normal limits  CBC WITH DIFFERENTIAL/PLATELET - Abnormal; Notable for the following components:   WBC 14.2 (*)    MCHC 29.9 (*)    Neutro Abs 10.3 (*)    Monocytes Absolute 1.1 (*)    All other components within normal limits  COMPREHENSIVE METABOLIC PANEL - Abnormal; Notable for the following components:   Glucose, Bld 104 (*)    Total Protein 8.4 (*)    All other components within normal limits  PROTIME-INR - Abnormal; Notable for the following components:   Prothrombin Time 15.9 (*)    All other components  within normal limits  TYPE AND SCREEN     IMAGES: CXR 05/18/17: IMPRESSION: No acute cardiopulmonary findings.  EKG: 05/18/17: NSR.   CV: Echo 09/28/15: Study Conclusions - Left ventricle: The cavity size was normal. Systolic function was normal. The estimated ejection fraction was in the range of 55% to 60%. Wall motion was normal; there were no regional wall motion abnormalities. Left ventricular diastolic function parameters were normal. - Aortic valve: There was trivial regurgitation. Valve area (VTI): 1.79 cm^2. Valve area (Vmax): 2.01 cm^2. Valve area  (Vmean): 2 cm^2. - Atrial septum: There was increased thickness of the septum, consistent with lipomatous hypertrophy. Past Medical History:  Diagnosis Date  . Anemia   . Anxiety   . Atrial flutter ([place])    a. 08/2012  . CHF (congestive heart failure) ([place])   . Dysrhythmia    a fib  . GERD (gastroesophageal reflux disease)   . History of gout   . History of kidney stones    passed  . Hypertension   . Obesity   . Pneumonia 09/2016  . Rheumatoid arthritis ([place])    "a little bit qwhere" (07/22/2017)  . Shortness of breath    with exterion from CHF  . Tobacco abuse     Past Surgical History:  Procedure Laterality Date  . A-FLUTTER ABLATION N/A 12/29/2016   Procedure: A-Flutter Ablation;  Surgeon: [name], MD;  Location: [facility];  Service: Cardiovascular;  Laterality: N/A;  . CARDIOVERSION N/A 11/05/2016   Procedure: CARDIOVERSION;  Surgeon: Sueanne Margarita, MD;  Location: [facility];  Service: Cardiovascular;  Laterality: N/A;  . IR THORACENTESIS ASP PLEURAL SPACE W/IMG GUIDE  10/01/2016  . JOINT REPLACEMENT     Right hip and left knee  . MICROLARYNGOSCOPY  09/02/2007   with excision of right vocal cord mass, Dr. Constance Holster  . TOTAL HIP ARTHROPLASTY Right 11/17/2017  . TOTAL HIP ARTHROPLASTY Right 11/17/2017   Procedure: RIGHT TOTAL HIP ARTHROPLASTY ANTERIOR APPROACH;  Surgeon: Leandrew Koyanagi, MD;  Location: [facility];  Service: Orthopedics;  Laterality: Right;  . TOTAL KNEE ARTHROPLASTY Left 07/22/2017  . TOTAL KNEE ARTHROPLASTY Left 07/22/2017   Procedure: LEFT TOTAL KNEE ARTHROPLASTY;  Surgeon: Leandrew Koyanagi, MD;  Location: [facility];  Service: Orthopedics;  Laterality: Left;    MEDICATIONS: . Adalimumab (HUMIRA) 40 MG/0.4ML PSKT  . cephALEXin (KEFLEX) 500 MG capsule  . docusate sodium (COLACE) 100 MG capsule  . famotidine (PEPCID) 20 MG tablet  . folic acid (FOLVITE) 1 MG tablet  . furosemide (LASIX) 40 MG tablet  . methocarbamol (ROBAXIN) 750 MG tablet  .  methotrexate (RHEUMATREX) 2.5 MG tablet  . metoprolol succinate (TOPROL XL) 25 MG 24 hr tablet  . mupirocin ointment (BACTROBAN) 2 %  . ondansetron (ZOFRAN) 4 MG tablet  . oxyCODONE (OXY IR/ROXICODONE) 5 MG immediate release tablet  . potassium chloride SA (K-DUR,KLOR-CON) 20 MEQ tablet  . promethazine (PHENERGAN) 25 MG tablet  . ranitidine (ZANTAC) 150 MG tablet  . rivaroxaban (XARELTO) 20 MG TABS tablet  . senna-docusate (SENOKOT S) 8.6-50 MG tablet  . sulfamethoxazole-trimethoprim (BACTRIM DS,SEPTRA DS) 800-160 MG tablet  . traMADol (ULTRAM) 50 MG tablet  . XARELTO 20 MG TABS tablet   No current facility-administered medications for this encounter.      [place], Or[place]off [facility] Short Stay Center/Anesthesiology Phone [phone] 03/02/2018 10:05 AM

## 2018-03-04 MED ORDER — TRANEXAMIC ACID 1000 MG/10ML IV SOLN
2000.0000 mg | INTRAVENOUS | Status: AC
  Administered 2018-03-07: 2000 mg via TOPICAL
  Filled 2018-03-04: qty 20

## 2018-03-04 MED ORDER — TRANEXAMIC ACID 1000 MG/10ML IV SOLN
1000.0000 mg | INTRAVENOUS | Status: AC
  Administered 2018-03-07: 1000 mg via INTRAVENOUS
  Filled 2018-03-04: qty 1000

## 2018-03-04 MED ORDER — VANCOMYCIN HCL 10 G IV SOLR
1500.0000 mg | INTRAVENOUS | Status: AC
  Administered 2018-03-07: 1500 mg via INTRAVENOUS
  Filled 2018-03-04: qty 1500

## 2018-03-07 ENCOUNTER — Inpatient Hospital Stay (HOSPITAL_COMMUNITY): Payer: Medicare Other

## 2018-03-07 ENCOUNTER — Inpatient Hospital Stay (HOSPITAL_COMMUNITY): Payer: Medicare Other | Admitting: Certified Registered"

## 2018-03-07 ENCOUNTER — Inpatient Hospital Stay (HOSPITAL_COMMUNITY)
Admission: RE | Admit: 2018-03-07 | Discharge: 2018-03-10 | DRG: 470 | Disposition: A | Discharge status: Skilled Nursing Facility | Payer: Medicare Other | Attending: Orthopaedic Surgery | Admitting: Orthopaedic Surgery

## 2018-03-07 ENCOUNTER — Encounter (HOSPITAL_COMMUNITY)
Admission: RE | Disposition: A | Discharge status: Skilled Nursing Facility | Payer: Self-pay | Source: Home / Self Care | Attending: Orthopaedic Surgery

## 2018-03-07 ENCOUNTER — Inpatient Hospital Stay (HOSPITAL_COMMUNITY): Payer: Medicare Other | Admitting: Physician Assistant

## 2018-03-07 ENCOUNTER — Encounter (HOSPITAL_COMMUNITY): Payer: Self-pay | Admitting: *Deleted

## 2018-03-07 ENCOUNTER — Other Ambulatory Visit: Payer: Self-pay

## 2018-03-07 DIAGNOSIS — Z23 Encounter for immunization: Secondary | ICD-10-CM

## 2018-03-07 DIAGNOSIS — I509 Heart failure, unspecified: Secondary | ICD-10-CM | POA: Diagnosis not present

## 2018-03-07 DIAGNOSIS — Z96652 Presence of left artificial knee joint: Secondary | ICD-10-CM | POA: Diagnosis present

## 2018-03-07 DIAGNOSIS — M1612 Unilateral primary osteoarthritis, left hip: Principal | ICD-10-CM | POA: Diagnosis present

## 2018-03-07 DIAGNOSIS — Z79899 Other long term (current) drug therapy: Secondary | ICD-10-CM | POA: Diagnosis not present

## 2018-03-07 DIAGNOSIS — Z419 Encounter for procedure for purposes other than remedying health state, unspecified: Secondary | ICD-10-CM

## 2018-03-07 DIAGNOSIS — Z96649 Presence of unspecified artificial hip joint: Secondary | ICD-10-CM

## 2018-03-07 DIAGNOSIS — R4781 Slurred speech: Secondary | ICD-10-CM | POA: Diagnosis not present

## 2018-03-07 DIAGNOSIS — D62 Acute posthemorrhagic anemia: Secondary | ICD-10-CM | POA: Diagnosis not present

## 2018-03-07 DIAGNOSIS — I4891 Unspecified atrial fibrillation: Secondary | ICD-10-CM | POA: Diagnosis not present

## 2018-03-07 DIAGNOSIS — Z96642 Presence of left artificial hip joint: Secondary | ICD-10-CM | POA: Diagnosis not present

## 2018-03-07 DIAGNOSIS — I110 Hypertensive heart disease with heart failure: Secondary | ICD-10-CM | POA: Diagnosis present

## 2018-03-07 DIAGNOSIS — G459 Transient cerebral ischemic attack, unspecified: Secondary | ICD-10-CM | POA: Diagnosis not present

## 2018-03-07 DIAGNOSIS — K219 Gastro-esophageal reflux disease without esophagitis: Secondary | ICD-10-CM | POA: Diagnosis present

## 2018-03-07 DIAGNOSIS — Z7901 Long term (current) use of anticoagulants: Secondary | ICD-10-CM | POA: Diagnosis not present

## 2018-03-07 DIAGNOSIS — R278 Other lack of coordination: Secondary | ICD-10-CM | POA: Diagnosis not present

## 2018-03-07 DIAGNOSIS — Z6833 Body mass index (BMI) 33.0-33.9, adult: Secondary | ICD-10-CM | POA: Diagnosis not present

## 2018-03-07 DIAGNOSIS — Z471 Aftercare following joint replacement surgery: Secondary | ICD-10-CM | POA: Diagnosis not present

## 2018-03-07 DIAGNOSIS — I10 Essential (primary) hypertension: Secondary | ICD-10-CM | POA: Diagnosis not present

## 2018-03-07 DIAGNOSIS — I4892 Unspecified atrial flutter: Secondary | ICD-10-CM | POA: Diagnosis not present

## 2018-03-07 DIAGNOSIS — R5381 Other malaise: Secondary | ICD-10-CM | POA: Diagnosis not present

## 2018-03-07 DIAGNOSIS — M069 Rheumatoid arthritis, unspecified: Secondary | ICD-10-CM | POA: Diagnosis present

## 2018-03-07 DIAGNOSIS — Z96641 Presence of right artificial hip joint: Secondary | ICD-10-CM | POA: Diagnosis not present

## 2018-03-07 DIAGNOSIS — M6281 Muscle weakness (generalized): Secondary | ICD-10-CM | POA: Diagnosis not present

## 2018-03-07 DIAGNOSIS — R262 Difficulty in walking, not elsewhere classified: Secondary | ICD-10-CM | POA: Diagnosis not present

## 2018-03-07 DIAGNOSIS — E669 Obesity, unspecified: Secondary | ICD-10-CM | POA: Diagnosis present

## 2018-03-07 DIAGNOSIS — I5030 Unspecified diastolic (congestive) heart failure: Secondary | ICD-10-CM | POA: Diagnosis not present

## 2018-03-07 DIAGNOSIS — R279 Unspecified lack of coordination: Secondary | ICD-10-CM | POA: Diagnosis not present

## 2018-03-07 DIAGNOSIS — F17210 Nicotine dependence, cigarettes, uncomplicated: Secondary | ICD-10-CM | POA: Diagnosis present

## 2018-03-07 DIAGNOSIS — Z743 Need for continuous supervision: Secondary | ICD-10-CM | POA: Diagnosis not present

## 2018-03-07 DIAGNOSIS — I5033 Acute on chronic diastolic (congestive) heart failure: Secondary | ICD-10-CM | POA: Diagnosis not present

## 2018-03-07 DIAGNOSIS — R498 Other voice and resonance disorders: Secondary | ICD-10-CM | POA: Diagnosis not present

## 2018-03-07 HISTORY — PX: TOTAL HIP ARTHROPLASTY: SHX124

## 2018-03-07 LAB — PROTIME-INR
INR: 0.97
PROTHROMBIN TIME: 12.8 s (ref 11.4–15.2)

## 2018-03-07 SURGERY — ARTHROPLASTY, HIP, TOTAL, ANTERIOR APPROACH
Anesthesia: Spinal | Site: Hip | Laterality: Left

## 2018-03-07 MED ORDER — ONDANSETRON HCL 4 MG PO TABS: 4.0000 mg | ORAL_TABLET | Freq: Four times a day (QID) | ORAL | Status: DC | PRN

## 2018-03-07 MED ORDER — CEFAZOLIN SODIUM-DEXTROSE 2-4 GM/100ML-% IV SOLN
2.0000 g | INTRAVENOUS | Status: AC
  Administered 2018-03-07: 2 g via INTRAVENOUS
  Filled 2018-03-07: qty 100

## 2018-03-07 MED ORDER — EPHEDRINE SULFATE 50 MG/ML IJ SOLN
INTRAMUSCULAR | Status: DC | PRN
  Administered 2018-03-07: 5 mg via INTRAVENOUS

## 2018-03-07 MED ORDER — SODIUM CHLORIDE 0.9 % IV SOLN
INTRAVENOUS | Status: DC | PRN
  Administered 2018-03-07: 25 ug/min via INTRAVENOUS

## 2018-03-07 MED ORDER — VANCOMYCIN HCL 1000 MG IV SOLR
INTRAVENOUS | Status: DC | PRN
  Administered 2018-03-07: 1000 mg
  Administered 2018-03-07: 1000 mg via TOPICAL

## 2018-03-07 MED ORDER — VANCOMYCIN HCL 1000 MG IV SOLR
INTRAVENOUS | Status: AC
  Filled 2018-03-07: qty 1000

## 2018-03-07 MED ORDER — ONDANSETRON HCL 4 MG/2ML IJ SOLN
INTRAMUSCULAR | Status: AC
  Filled 2018-03-07: qty 2

## 2018-03-07 MED ORDER — LACTATED RINGERS IV SOLN: INTRAVENOUS | Status: DC

## 2018-03-07 MED ORDER — VANCOMYCIN HCL 10 G IV SOLR
1500.0000 mg | Freq: Two times a day (BID) | INTRAVENOUS | Status: AC
  Administered 2018-03-07 – 2018-03-08 (×3): 1500 mg via INTRAVENOUS
  Filled 2018-03-07 (×3): qty 1500

## 2018-03-07 MED ORDER — KETOROLAC TROMETHAMINE 15 MG/ML IJ SOLN
30.0000 mg | Freq: Four times a day (QID) | INTRAMUSCULAR | Status: AC
  Administered 2018-03-07 – 2018-03-08 (×4): 30 mg via INTRAVENOUS
  Filled 2018-03-07 (×4): qty 2

## 2018-03-07 MED ORDER — EPHEDRINE 5 MG/ML INJ
INTRAVENOUS | Status: AC
  Filled 2018-03-07: qty 10

## 2018-03-07 MED ORDER — METHOTREXATE 2.5 MG PO TABS
20.0000 mg | ORAL_TABLET | ORAL | Status: DC
  Administered 2018-03-09: 20 mg via ORAL
  Filled 2018-03-07: qty 8

## 2018-03-07 MED ORDER — MIDAZOLAM HCL 2 MG/2ML IJ SOLN
INTRAMUSCULAR | Status: AC
  Filled 2018-03-07: qty 2

## 2018-03-07 MED ORDER — POTASSIUM CHLORIDE CRYS ER 20 MEQ PO TBCR
20.0000 meq | EXTENDED_RELEASE_TABLET | Freq: Every day | ORAL | Status: DC
  Administered 2018-03-07: 20 meq via ORAL
  Filled 2018-03-07: qty 1

## 2018-03-07 MED ORDER — OXYCODONE HCL ER 10 MG PO T12A: 10.0000 mg | EXTENDED_RELEASE_TABLET | Freq: Two times a day (BID) | ORAL | 0 refills | Status: AC

## 2018-03-07 MED ORDER — DEXAMETHASONE SODIUM PHOSPHATE 10 MG/ML IJ SOLN
10.0000 mg | Freq: Once | INTRAMUSCULAR | Status: AC
  Administered 2018-03-08: 10 mg via INTRAVENOUS
  Filled 2018-03-07: qty 1

## 2018-03-07 MED ORDER — 0.9 % SODIUM CHLORIDE (POUR BTL) OPTIME
TOPICAL | Status: DC | PRN
  Administered 2018-03-07: 1000 mL

## 2018-03-07 MED ORDER — SENNOSIDES-DOCUSATE SODIUM 8.6-50 MG PO TABS: 1.0000 | ORAL_TABLET | Freq: Every evening | ORAL | 1 refills | Status: DC | PRN

## 2018-03-07 MED ORDER — LACTATED RINGERS IV SOLN
INTRAVENOUS | Status: DC | PRN
  Administered 2018-03-07 (×2): via INTRAVENOUS

## 2018-03-07 MED ORDER — CHLORHEXIDINE GLUCONATE 4 % EX LIQD: 60.0000 mL | Freq: Once | CUTANEOUS | Status: DC

## 2018-03-07 MED ORDER — METHOCARBAMOL 750 MG PO TABS: 750.0000 mg | ORAL_TABLET | Freq: Two times a day (BID) | ORAL | 0 refills | Status: DC | PRN

## 2018-03-07 MED ORDER — ACETAMINOPHEN 500 MG PO TABS
1000.0000 mg | ORAL_TABLET | Freq: Four times a day (QID) | ORAL | Status: AC
  Administered 2018-03-07 – 2018-03-08 (×4): 1000 mg via ORAL
  Filled 2018-03-07 (×3): qty 2

## 2018-03-07 MED ORDER — OXYCODONE HCL 5 MG PO TABS
5.0000 mg | ORAL_TABLET | ORAL | Status: DC | PRN
  Filled 2018-03-07: qty 2

## 2018-03-07 MED ORDER — POLYETHYLENE GLYCOL 3350 17 G PO PACK: 17.0000 g | PACK | Freq: Every day | ORAL | Status: DC | PRN

## 2018-03-07 MED ORDER — MEPERIDINE HCL 50 MG/ML IJ SOLN: 6.2500 mg | INTRAMUSCULAR | Status: DC | PRN

## 2018-03-07 MED ORDER — HYDROMORPHONE HCL 1 MG/ML IJ SOLN
INTRAMUSCULAR | Status: AC
  Filled 2018-03-07: qty 1

## 2018-03-07 MED ORDER — OXYCODONE HCL 5 MG PO TABS: 5.0000 mg | ORAL_TABLET | ORAL | 0 refills | Status: DC | PRN

## 2018-03-07 MED ORDER — PHENYLEPHRINE HCL 10 MG/ML IJ SOLN
INTRAMUSCULAR | Status: DC | PRN
  Administered 2018-03-07 (×2): 80 ug via INTRAVENOUS

## 2018-03-07 MED ORDER — SODIUM CHLORIDE 0.9 % IR SOLN
Status: DC | PRN
  Administered 2018-03-07: 3000 mL

## 2018-03-07 MED ORDER — MUPIROCIN 2 % EX OINT: 1.0000 "application " | TOPICAL_OINTMENT | Freq: Two times a day (BID) | CUTANEOUS | 0 refills | Status: DC

## 2018-03-07 MED ORDER — MAGNESIUM CITRATE PO SOLN: 1.0000 | Freq: Once | ORAL | Status: DC | PRN

## 2018-03-07 MED ORDER — ONDANSETRON HCL 4 MG/2ML IJ SOLN
INTRAMUSCULAR | Status: DC | PRN
  Administered 2018-03-07: 4 mg via INTRAVENOUS

## 2018-03-07 MED ORDER — BUPIVACAINE IN DEXTROSE 0.75-8.25 % IT SOLN
INTRATHECAL | Status: DC | PRN
  Administered 2018-03-07: 2 mL via INTRATHECAL

## 2018-03-07 MED ORDER — SODIUM CHLORIDE 0.9 % IV SOLN
INTRAVENOUS | Status: DC
  Administered 2018-03-07 – 2018-03-08 (×2): via INTRAVENOUS
  Administered 2018-03-08: 75 mL/h via INTRAVENOUS

## 2018-03-07 MED ORDER — ACETAMINOPHEN 10 MG/ML IV SOLN
INTRAVENOUS | Status: AC
  Filled 2018-03-07: qty 100

## 2018-03-07 MED ORDER — HYDROMORPHONE HCL 1 MG/ML IJ SOLN
0.2500 mg | INTRAMUSCULAR | Status: DC | PRN
  Administered 2018-03-07 (×4): 0.5 mg via INTRAVENOUS

## 2018-03-07 MED ORDER — SORBITOL 70 % SOLN: 30.0000 mL | Freq: Every day | Status: DC | PRN

## 2018-03-07 MED ORDER — DIPHENHYDRAMINE HCL 12.5 MG/5ML PO ELIX: 25.0000 mg | ORAL_SOLUTION | ORAL | Status: DC | PRN

## 2018-03-07 MED ORDER — ALBUMIN HUMAN 5 % IV SOLN
INTRAVENOUS | Status: DC | PRN
  Administered 2018-03-07: 13:00:00 via INTRAVENOUS

## 2018-03-07 MED ORDER — LIDOCAINE 2% (20 MG/ML) 5 ML SYRINGE
INTRAMUSCULAR | Status: AC
  Filled 2018-03-07: qty 15

## 2018-03-07 MED ORDER — METOCLOPRAMIDE HCL 5 MG/ML IJ SOLN: 5.0000 mg | Freq: Three times a day (TID) | INTRAMUSCULAR | Status: DC | PRN

## 2018-03-07 MED ORDER — ACETAMINOPHEN 325 MG PO TABS: 325.0000 mg | ORAL_TABLET | Freq: Four times a day (QID) | ORAL | Status: DC | PRN

## 2018-03-07 MED ORDER — LIDOCAINE HCL (CARDIAC) PF 100 MG/5ML IV SOSY
PREFILLED_SYRINGE | INTRAVENOUS | Status: DC | PRN
  Administered 2018-03-07: 40 mg via INTRAVENOUS

## 2018-03-07 MED ORDER — OXYCODONE HCL 5 MG PO TABS
10.0000 mg | ORAL_TABLET | ORAL | Status: DC | PRN
  Administered 2018-03-08: 15 mg via ORAL
  Administered 2018-03-09 – 2018-03-10 (×4): 10 mg via ORAL
  Administered 2018-03-10: 15 mg via ORAL
  Filled 2018-03-07: qty 2
  Filled 2018-03-07: qty 3
  Filled 2018-03-07: qty 2
  Filled 2018-03-07: qty 3
  Filled 2018-03-07: qty 2

## 2018-03-07 MED ORDER — PROMETHAZINE HCL 25 MG/ML IJ SOLN: 6.2500 mg | INTRAMUSCULAR | Status: DC | PRN

## 2018-03-07 MED ORDER — LACTATED RINGERS IV SOLN
INTRAVENOUS | Status: DC
  Administered 2018-03-07: 09:00:00 via INTRAVENOUS

## 2018-03-07 MED ORDER — SULFAMETHOXAZOLE-TRIMETHOPRIM 800-160 MG PO TABS
1.0000 | ORAL_TABLET | Freq: Two times a day (BID) | ORAL | Status: DC
  Administered 2018-03-07 – 2018-03-10 (×6): 1 via ORAL
  Filled 2018-03-07 (×7): qty 1

## 2018-03-07 MED ORDER — OXYCODONE HCL ER 10 MG PO T12A
10.0000 mg | EXTENDED_RELEASE_TABLET | Freq: Two times a day (BID) | ORAL | Status: DC
  Administered 2018-03-07 – 2018-03-10 (×7): 10 mg via ORAL
  Filled 2018-03-07 (×7): qty 1

## 2018-03-07 MED ORDER — SULFAMETHOXAZOLE-TRIMETHOPRIM 800-160 MG PO TABS: 1.0000 | ORAL_TABLET | Freq: Two times a day (BID) | ORAL | 0 refills | Status: DC

## 2018-03-07 MED ORDER — PROPOFOL 500 MG/50ML IV EMUL
INTRAVENOUS | Status: DC | PRN
  Administered 2018-03-07: 75 ug/kg/min via INTRAVENOUS

## 2018-03-07 MED ORDER — DOCUSATE SODIUM 100 MG PO CAPS
100.0000 mg | ORAL_CAPSULE | Freq: Two times a day (BID) | ORAL | Status: DC
  Administered 2018-03-07 – 2018-03-10 (×7): 100 mg via ORAL
  Filled 2018-03-07 (×7): qty 1

## 2018-03-07 MED ORDER — METHOCARBAMOL 1000 MG/10ML IJ SOLN
500.0000 mg | Freq: Four times a day (QID) | INTRAVENOUS | Status: DC | PRN
  Filled 2018-03-07: qty 5

## 2018-03-07 MED ORDER — HYDROMORPHONE HCL 1 MG/ML IJ SOLN
0.5000 mg | INTRAMUSCULAR | Status: DC | PRN
  Administered 2018-03-08 – 2018-03-10 (×9): 1 mg via INTRAVENOUS
  Filled 2018-03-07 (×9): qty 1

## 2018-03-07 MED ORDER — PROMETHAZINE HCL 25 MG PO TABS: 25.0000 mg | ORAL_TABLET | Freq: Four times a day (QID) | ORAL | 1 refills | Status: DC | PRN

## 2018-03-07 MED ORDER — METOCLOPRAMIDE HCL 5 MG PO TABS: 5.0000 mg | ORAL_TABLET | Freq: Three times a day (TID) | ORAL | Status: DC | PRN

## 2018-03-07 MED ORDER — ONDANSETRON HCL 4 MG/2ML IJ SOLN: 4.0000 mg | Freq: Four times a day (QID) | INTRAMUSCULAR | Status: DC | PRN

## 2018-03-07 MED ORDER — METHOCARBAMOL 500 MG PO TABS
500.0000 mg | ORAL_TABLET | Freq: Four times a day (QID) | ORAL | Status: DC | PRN
  Administered 2018-03-08 – 2018-03-10 (×2): 500 mg via ORAL
  Filled 2018-03-07 (×2): qty 1

## 2018-03-07 MED ORDER — FENTANYL CITRATE (PF) 250 MCG/5ML IJ SOLN
INTRAMUSCULAR | Status: AC
  Filled 2018-03-07: qty 5

## 2018-03-07 MED ORDER — PHENYLEPHRINE HCL 10 MG/ML IJ SOLN
INTRAMUSCULAR | Status: AC
  Filled 2018-03-07: qty 1

## 2018-03-07 MED ORDER — ONDANSETRON HCL 4 MG PO TABS: 4.0000 mg | ORAL_TABLET | Freq: Three times a day (TID) | ORAL | 0 refills | Status: DC | PRN

## 2018-03-07 MED ORDER — GABAPENTIN 300 MG PO CAPS
300.0000 mg | ORAL_CAPSULE | Freq: Three times a day (TID) | ORAL | Status: DC
  Administered 2018-03-07 – 2018-03-10 (×8): 300 mg via ORAL
  Filled 2018-03-07 (×12): qty 1

## 2018-03-07 MED ORDER — ALUM & MAG HYDROXIDE-SIMETH 200-200-20 MG/5ML PO SUSP
30.0000 mL | ORAL | Status: DC | PRN
  Administered 2018-03-09 – 2018-03-10 (×2): 30 mL via ORAL
  Filled 2018-03-07 (×2): qty 30

## 2018-03-07 MED ORDER — ACETAMINOPHEN 10 MG/ML IV SOLN
1000.0000 mg | Freq: Once | INTRAVENOUS | Status: DC | PRN
  Administered 2018-03-07: 1000 mg via INTRAVENOUS

## 2018-03-07 MED ORDER — PHENOL 1.4 % MT LIQD: 1.0000 | OROMUCOSAL | Status: DC | PRN

## 2018-03-07 MED ORDER — RIVAROXABAN 20 MG PO TABS
20.0000 mg | ORAL_TABLET | Freq: Every day | ORAL | Status: DC
  Administered 2018-03-08 – 2018-03-10 (×3): 20 mg via ORAL
  Filled 2018-03-07 (×3): qty 1

## 2018-03-07 MED ORDER — MENTHOL 3 MG MT LOZG: 1.0000 | LOZENGE | OROMUCOSAL | Status: DC | PRN

## 2018-03-07 MED ORDER — HYDROCODONE-ACETAMINOPHEN 7.5-325 MG PO TABS: 1.0000 | ORAL_TABLET | Freq: Once | ORAL | Status: DC | PRN

## 2018-03-07 MED ORDER — MIDAZOLAM HCL 5 MG/5ML IJ SOLN
INTRAMUSCULAR | Status: DC | PRN
  Administered 2018-03-07: 2 mg via INTRAVENOUS

## 2018-03-07 MED ORDER — FENTANYL CITRATE (PF) 100 MCG/2ML IJ SOLN
INTRAMUSCULAR | Status: DC | PRN
  Administered 2018-03-07 (×4): 50 ug via INTRAVENOUS

## 2018-03-07 MED ORDER — TRANEXAMIC ACID 1000 MG/10ML IV SOLN
1000.0000 mg | Freq: Once | INTRAVENOUS | Status: AC
  Administered 2018-03-07: 1000 mg via INTRAVENOUS
  Filled 2018-03-07: qty 10

## 2018-03-07 SURGICAL SUPPLY — 58 items
BAG DECANTER FOR FLEXI CONT (MISCELLANEOUS) ×3 IMPLANT
CABLE ASSY CERCLAGE SST 1.8X55 (Orthopedic Implant) ×3 IMPLANT
CELLS DAT CNTRL 66122 CELL SVR (MISCELLANEOUS) ×1 IMPLANT
COVER SURGICAL LIGHT HANDLE (MISCELLANEOUS) ×3 IMPLANT
CUP ACET PNNCL SECTR W/GRIP 56 (Hips) ×1 IMPLANT
DRAPE C-ARM 42X72 X-RAY (DRAPES) ×3 IMPLANT
DRAPE POUCH INSTRU U-SHP 10X18 (DRAPES) ×3 IMPLANT
DRAPE STERI IOBAN 125X83 (DRAPES) ×3 IMPLANT
DRAPE U-SHAPE 47X51 STRL (DRAPES) ×6 IMPLANT
DRSG AQUACEL AG ADV 3.5X10 (GAUZE/BANDAGES/DRESSINGS) ×3 IMPLANT
DURAPREP 26ML APPLICATOR (WOUND CARE) ×3 IMPLANT
ELECT BLADE 4.0 EZ CLEAN MEGAD (MISCELLANEOUS) ×3
ELECT REM PT RETURN 9FT ADLT (ELECTROSURGICAL) ×3
ELECTRODE BLDE 4.0 EZ CLN MEGD (MISCELLANEOUS) ×1 IMPLANT
ELECTRODE REM PT RTRN 9FT ADLT (ELECTROSURGICAL) ×1 IMPLANT
GAUZE XEROFORM 1X8 LF (GAUZE/BANDAGES/DRESSINGS) ×3 IMPLANT
GLOVE BIOGEL PI IND STRL 7.0 (GLOVE) ×1 IMPLANT
GLOVE BIOGEL PI INDICATOR 7.0 (GLOVE) ×2
GLOVE ECLIPSE 7.0 STRL STRAW (GLOVE) ×6 IMPLANT
GLOVE SKINSENSE NS SZ7.5 (GLOVE) ×2
GLOVE SKINSENSE STRL SZ7.5 (GLOVE) ×1 IMPLANT
GLOVE SURG SS PI 7.0 STRL IVOR (GLOVE) ×6 IMPLANT
GLOVE SURG SYN 7.5  E (GLOVE) ×8
GLOVE SURG SYN 7.5 E (GLOVE) ×4 IMPLANT
GOWN SRG XL XLNG 56XLVL 4 (GOWN DISPOSABLE) ×1 IMPLANT
GOWN STRL NON-REIN XL XLG LVL4 (GOWN DISPOSABLE) ×2
GOWN STRL REUS W/ TWL LRG LVL3 (GOWN DISPOSABLE) ×1 IMPLANT
GOWN STRL REUS W/ TWL XL LVL3 (GOWN DISPOSABLE) ×1 IMPLANT
GOWN STRL REUS W/TWL LRG LVL3 (GOWN DISPOSABLE) ×2
GOWN STRL REUS W/TWL XL LVL3 (GOWN DISPOSABLE) ×2
HANDPIECE INTERPULSE COAX TIP (DISPOSABLE) ×2
HEAD CERAMIC BIO DELTA 36 (Hips) ×3 IMPLANT
HOOD PEEL AWAY FLYTE STAYCOOL (MISCELLANEOUS) ×6 IMPLANT
IV NS IRRIG 3000ML ARTHROMATIC (IV SOLUTION) ×3 IMPLANT
KIT BASIN OR (CUSTOM PROCEDURE TRAY) ×3 IMPLANT
MARKER SKIN DUAL TIP RULER LAB (MISCELLANEOUS) ×3 IMPLANT
NEEDLE SPNL 18GX3.5 QUINCKE PK (NEEDLE) ×3 IMPLANT
PACK TOTAL JOINT (CUSTOM PROCEDURE TRAY) ×3 IMPLANT
PACK UNIVERSAL I (CUSTOM PROCEDURE TRAY) ×3 IMPLANT
PINN SECTOR W/GRIP ACE CUP 56 (Hips) ×3 IMPLANT
PINNACLE ALTRX PLUS 4 N 36X56 (Hips) ×3 IMPLANT
RTRCTR WOUND ALEXIS 18CM MED (MISCELLANEOUS) ×3
SAW OSC TIP CART 19.5X105X1.3 (SAW) ×3 IMPLANT
SCREW 6.5MMX25MM (Screw) ×3 IMPLANT
SET HNDPC FAN SPRY TIP SCT (DISPOSABLE) ×1 IMPLANT
STEM CORAIL KA15 (Stem) ×3 IMPLANT
SUT ETHIBOND 2 V 37 (SUTURE) ×3 IMPLANT
SUT ETHILON 2 0 FS 18 (SUTURE) ×12 IMPLANT
SUT VIC AB 0 CT1 27 (SUTURE) ×2
SUT VIC AB 0 CT1 27XBRD ANBCTR (SUTURE) ×1 IMPLANT
SUT VIC AB 1 CT1 27 (SUTURE) ×4
SUT VIC AB 1 CT1 27XBRD ANBCTR (SUTURE) ×2 IMPLANT
SUT VIC AB 2-0 CT1 27 (SUTURE) ×6
SUT VIC AB 2-0 CT1 TAPERPNT 27 (SUTURE) ×3 IMPLANT
SYRINGE 60CC LL (MISCELLANEOUS) ×3 IMPLANT
TOWEL OR 17X26 10 PK STRL BLUE (TOWEL DISPOSABLE) ×3 IMPLANT
TRAY CATH 16FR W/PLASTIC CATH (SET/KITS/TRAYS/PACK) ×3 IMPLANT
YANKAUER SUCT BULB TIP NO VENT (SUCTIONS) ×3 IMPLANT

## 2018-03-07 NOTE — Op Note (Addendum)
LEFT TOTAL HIP ARTHROPLASTY ANTERIOR APPROACH  Procedure Note Brian [name]   [id number]  Pre-op Diagnosis: left hip degenerative joint disease     Post-op Diagnosis: same   Operative Procedures  1. Total hip replacement; Left hip; uncemented [id number]   Personnel  Surgeon(s): Leandrew Koyanagi, MD  Assist: Madalyn Rob, PA-C; necessary for the timely completion of procedure and due to complexity of procedure.   Anesthesia: spinal  Prosthesis: Depuy Acetabulum: Pinnacle 56 mm Femur: Corail KA 15 Head: 36 mm size: +12 Liner: +4 neutral Bearing Type: ceramic on poly  Total Hip Arthroplasty (Anterior Approach) Op Note:  After informed consent was obtained and the operative extremity marked in the holding area, the patient was brought back to the operating room and placed supine on the HANA table. Next, the operative extremity was prepped and draped in normal sterile fashion. Surgical timeout occurred verifying patient identification, surgical site, surgical procedure and administration of antibiotics.  A modified anterior Smith-Peterson approach to the hip was performed, using the interval between tensor fascia lata and sartorius.  Dissection was carried bluntly down onto the anterior hip capsule. The lateral femoral circumflex vessels were identified and coagulated. A capsulotomy was performed and the capsular flaps tagged for later repair.  Fluoroscopy was utilized to prepare for the femoral neck cut. The neck osteotomy was performed. The femoral head was removed, the acetabular rim was cleared of soft tissue and attention was turned to reaming the acetabulum.  Sequential reaming was performed under fluoroscopic guidance. We reamed to a size 55 mm, and then impacted the acetabular shell. The liner was then placed after irrigation and attention turned to the femur.  After placing the femoral hook, the leg was taken to externally rotated, extended and adducted position taking  care to perform soft tissue releases to allow for adequate mobilization of the femur. Soft tissue was cleared from the shoulder of the greater trochanter and the hook elevator used to improve exposure of the proximal femur. I had to elevate the TFL muscle off of the outer table of the iliac crest in order to achieve the desired exposure.  His muscle tension was overall very high.  Sequential broaching performed up to a size 15. Trial neck and head were placed. The leg was brought back up to neutral and the construct reduced. The position and sizing of components, offset and leg lengths were checked using fluoroscopy. Stability of the construct was checked in extension and external rotation without any subluxation or impingement of prosthesis. We dislocated the prosthesis, dropped the leg back into position.  While we were removing the trial stem, I noticed the greater trochanter fractured posterolaterally.  At that point, I left the trial stem which had excellent stability.  I then brought the leg back up and rotated back to neutral in order to reduce the greater trochanter.  I then placed a single cerclage cable from the greater trochanter around and below the lesser trochanter in order to hold the fracture reduced.  The cable was tensioned and cut short.  This was confirmed under fluoroscopy.  I then placed the leg back in the orientation for removal of trial components, and irrigated copiously. The final stem and head was then placed which again exhibited excellent fit and stability, the leg brought back up, the system reduced and fluoroscopy used to verify positioning.  Of note, his bone quality was poor. We irrigated, obtained hemostasis and closed the capsule using #2 ethibond suture.  Two  grams of vancomycin powder was placed in the surgical bed. The fascia was closed with #1 vicryl plus, the deep fat layer was closed with 0 vicryl, the subcutaneous layers closed with 2.0 Vicryl Plus and the skin closed with  2.0 nylon and steri strips. A sterile dressing was applied. The patient was awakened in the operating room and taken to recovery in stable condition.  All sponge, needle, and instrument counts were correct at the end of the case.   Position: supine  Complications: none.  Time Out: performed   Drains/Packing: none  Estimated blood loss: 400 cc  Returned to Recovery Room: in good condition.   Antibiotics: yes   Mechanical VTE (DVT) Prophylaxis: sequential compression devices, TED thigh-high  Chemical VTE (DVT) Prophylaxis: resume xarelto   Fluid Replacement: see anesthesia record  Specimens Removed: 1 to pathology   Sponge and Instrument Count Correct? yes   PACU: portable radiograph - low AP   Admission: inpatient status  Plan/RTC: Return in 2 weeks for staple removal. Weight Bearing/Load Lower Extremity: touch down weight bearing leg leg, no hip abduction  Hip precautions: none Suture Removal: 12-14 days  Betadine to incision twice daily once dressing is removed on POD#7  N. Eduard Roux, MD [place] [phone] 1:06 PM     Implant Name Type Inv. Item Serial No. Manufacturer Lot No. LRB No. Used  PINNACLE ALTRX PLUS 4 N [id number] - [id number] Hips PINNACLE ALTRX PLUS 4 N [id number]  DEPUY SYNTHES [id number] Left 1  PINN SECTOR W/GRIP ACE CUP 56 - [id number] Hips PINN SECTOR W/GRIP ACE CUP 56  DEPUY SYNTHES [id number] Left 1  SCREW 6.5MMX25MM - [id number] Screw SCREW 6.5MMX25MM  DEPUY SYNTHES [id number] Left 1  HEAD CERAMIC BIO DELTA 36 - [id number] Hips HEAD CERAMIC BIO DELTA 36  DEPUY SYNTHES [id number] Left 1  STEM CORAIL KA15 - [id number] Stem STEM CORAIL KA15  DEPUY SYNTHES [id number] Left 1  CABLE ASSY CERCLAGE SST 1.8MM - [id number] Orthopedic Implant CABLE ASSY CERCLAGE SST 1.8MM  ZIMMER RECON(ORTH,TRAU,BIO,SG) [id number] Left 1

## 2018-03-07 NOTE — Transfer of Care (Signed)
Immediate Anesthesia Transfer of Care Note  Patient: Brian [name]  Procedure(s) Performed: LEFT TOTAL HIP ARTHROPLASTY ANTERIOR APPROACH (Left Hip)  Patient Location: PACU  Anesthesia Type:Spinal  Level of Consciousness: awake, alert  and oriented  Airway & Oxygen Therapy: Patient Spontanous Breathing and Patient connected to face mask oxygen  Post-op Assessment: Report given to RN, Post -op Vital signs reviewed and stable and Patient moving all extremities X 4  Post vital signs: Reviewed and stable  Last Vitals:  Vitals Value Taken Time  BP 99/72 03/07/2018  1:46 PM  Temp    Pulse 71 03/07/2018  1:49 PM  Resp 11 03/07/2018  1:49 PM  SpO2 100 % 03/07/2018  1:49 PM  Vitals shown include unvalidated device data.  Last Pain:  Vitals:   03/07/18 0848  PainSc: 8       Patients Stated Pain Goal: 3 (57/90/38 3338)  Complications: No apparent anesthesia complications

## 2018-03-07 NOTE — H&P (Signed)
PREOPERATIVE H&P  Chief Complaint: left hip degenerative joint disease  HPI: Brian [name] is a 67 y.o. male who presents for surgical treatment of left hip degenerative joint disease.  He denies any changes in medical history.  Past Medical History:  Diagnosis Date  . Anemia   . Anxiety   . Atrial flutter ([place])    a. 08/2012  . CHF (congestive heart failure) ([place])   . Dysrhythmia    a fib  . GERD (gastroesophageal reflux disease)   . History of gout   . History of kidney stones    passed  . Hypertension   . Obesity   . Pneumonia 09/2016  . Rheumatoid arthritis ([place])    "a little bit qwhere" (07/22/2017)  . Shortness of breath    with exterion from CHF  . Tobacco abuse    Past Surgical History:  Procedure Laterality Date  . A-FLUTTER ABLATION N/A 12/29/2016   Procedure: A-Flutter Ablation;  Surgeon: [name], MD;  Location: [facility];  Service: Cardiovascular;  Laterality: N/A;  . CARDIOVERSION N/A 11/05/2016   Procedure: CARDIOVERSION;  Surgeon: Sueanne Margarita, MD;  Location: [facility];  Service: Cardiovascular;  Laterality: N/A;  . IR THORACENTESIS ASP PLEURAL SPACE W/IMG GUIDE  10/01/2016  . JOINT REPLACEMENT     Right hip and left knee  . MICROLARYNGOSCOPY  09/02/2007   with excision of right vocal cord mass, Dr. Constance Holster  . TOTAL HIP ARTHROPLASTY Right 11/17/2017  . TOTAL HIP ARTHROPLASTY Right 11/17/2017   Procedure: RIGHT TOTAL HIP ARTHROPLASTY ANTERIOR APPROACH;  Surgeon: Leandrew Koyanagi, MD;  Location: [facility];  Service: Orthopedics;  Laterality: Right;  . TOTAL KNEE ARTHROPLASTY Left 07/22/2017  . TOTAL KNEE ARTHROPLASTY Left 07/22/2017   Procedure: LEFT TOTAL KNEE ARTHROPLASTY;  Surgeon: Leandrew Koyanagi, MD;  Location: [facility];  Service: Orthopedics;  Laterality: Left;   Social History   Socioeconomic History  . Marital status: Divorced    Spouse name: Not on file  . Number of children: 3  . Years of education: Not on file  . Highest education  level: Not on file  Occupational History  . Not on file  Social Needs  . Financial resource strain: Not on file  . Food insecurity:    Worry: Not on file    Inability: Not on file  . Transportation needs:    Medical: Not on file    Non-medical: Not on file  Tobacco Use  . Smoking status: Current Every Day Smoker    Packs/day: 0.13    Years: 45.00    Pack years: 5.85    Types: Cigarettes  . Smokeless tobacco: Never Used  . Tobacco comment: "quit off and on"  Substance and Sexual Activity  . Alcohol use: Yes    Comment: 07/22/2017 "nothing since 2016"  . Drug use: No    Comment: 07/22/2017 "none since 11/2016" cocaine (sister is not aware)  . Sexual activity: Not Currently  Lifestyle  . Physical activity:    Days per week: Not on file    Minutes per session: Not on file  . Stress: Not on file  Relationships  . Social connections:    Talks on phone: Not on file    Gets together: Not on file    Attends religious service: Not on file    Active member of club or organization: Not on file    Attends meetings of clubs or organizations: Not on file  Relationship status: Not on file  Other Topics Concern  . Not on file  Social History Narrative   Lives in [place]    Works at a convenience store   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   Divorced with 3 children and 9 grandchildren, he states ex-wife is still a friend   Family History  Problem Relation Age of Onset  . Cancer Mother   . Heart disease Father   . Heart attack Father   . Cancer Father   . Diabetes Sister    Allergies  Allergen Reactions  . Tape Rash and Other (See Comments)    Paper tape is preferred, please!!   Prior to Admission medications   Medication Sig Start Date End Date Taking? Authorizing Provider  docusate sodium (COLACE) 100 MG capsule Take 200 mg by mouth 2 (two) times daily.    Yes [provider]  folic acid (FOLVITE) 1 MG tablet Take 1 mg by mouth daily. 04/15/17  Yes [provider]  furosemide (LASIX) 40 MG tablet Take 1 tablet (40 mg total) by mouth daily. Please call and schedule an appt for further refills, 1st attempt Patient taking differently: Take 40 mg by mouth 2 (two) times daily. Please call and schedule an appt for further refills, 1st attempt 11/18/17  Yes Jerline Pain, MD  potassium chloride SA (K-DUR,KLOR-CON) 20 MEQ tablet Take 1 tablet (20 mEq total) by mouth daily. 10/26/16  Yes Jerline Pain, MD  ranitidine (ZANTAC) 150 MG tablet Take 150 mg by mouth daily.   Yes [provider]  rivaroxaban (XARELTO) 20 MG TABS tablet Take 1 tablet (20 mg total) by mouth daily with supper. 07/25/17  Yes Jessy Oto, MD  Adalimumab (HUMIRA) 40 MG/0.4ML PSKT Inject 40 mg into the skin every 14 (fourteen) days.    [provider]  cephALEXin (KEFLEX) 500 MG capsule Take 1 capsule (500 mg total) by mouth 4 (four) times daily. Patient not taking: Reported on 02/23/2018 11/18/17   Leandrew Koyanagi, MD  famotidine (PEPCID) 20 MG tablet Take 1 tablet (20 mg total) by mouth at bedtime. Patient not taking: Reported on 02/23/2018 07/25/17   Jessy Oto, MD  methocarbamol (ROBAXIN) 750 MG tablet Take 1 tablet (750 mg total) by mouth 2 (two) times daily as needed for muscle spasms. Patient not taking: Reported on 02/23/2018 11/17/17   Leandrew Koyanagi, MD  methotrexate (RHEUMATREX) 2.5 MG tablet Take 20 mg by mouth every Friday. In the morning. 04/26/17   [provider]  metoprolol succinate (TOPROL XL) 25 MG 24 hr tablet Take 1 tablet (25 mg total) 2 (two) times daily by mouth. Patient not taking: Reported on 02/23/2018 05/11/17   Jerline Pain, MD  mupirocin ointment (BACTROBAN) 2 % Apply to affected area 3 times daily 5 days pre-op Patient not taking: Reported on 02/23/2018 11/08/17 11/08/18  Aundra Dubin, PA-C  ondansetron (ZOFRAN) 4 MG tablet Take 1-2 tablets (4-8 mg total) by mouth every 8 (eight) hours as needed for nausea or  vomiting. Patient not taking: Reported on 02/23/2018 11/17/17   Leandrew Koyanagi, MD  oxyCODONE (OXY IR/ROXICODONE) 5 MG immediate release tablet 1-2 tabs every 6 hours prn pain Patient not taking: Reported on 02/23/2018 11/26/17   Leandrew Koyanagi, MD  promethazine (PHENERGAN) 25 MG tablet Take 1 tablet (25 mg total) by mouth every 6 (six) hours as needed for nausea. Patient not taking: Reported on 02/23/2018 11/17/17   Frankey Shown  M, MD  senna-docusate (SENOKOT S) 8.6-50 MG tablet Take 1 tablet by mouth at bedtime as needed. Patient not taking: Reported on 02/23/2018 11/17/17   Leandrew Koyanagi, MD  sulfamethoxazole-trimethoprim (BACTRIM DS,SEPTRA DS) 800-160 MG tablet Take 1 tablet by mouth 2 (two) times daily. Patient not taking: Reported on 02/23/2018 11/17/17   Leandrew Koyanagi, MD  traMADol (ULTRAM) 50 MG tablet Take 1 tablet (50 mg total) by mouth 2 (two) times daily. Patient not taking: Reported on 02/23/2018 01/27/18   Leandrew Koyanagi, MD  XARELTO 20 MG TABS tablet TAKE 1 TABLET (20 MG TOTAL) BY MOUTH DAILY WITH SUPPER. 02/23/18   Allred, Jeneen Rinks, MD     Positive ROS: All other systems have been reviewed and were otherwise negative with the exception of those mentioned in the HPI and as above.  Physical Exam: General: Alert, no acute distress Cardiovascular: No pedal edema Respiratory: No cyanosis, no use of accessory musculature GI: abdomen soft Skin: No lesions in the area of chief complaint Neurologic: Sensation intact distally Psychiatric: Patient is competent for consent with normal mood and affect Lymphatic: no lymphedema  MUSCULOSKELETAL: exam stable  Assessment: left hip degenerative joint disease  Plan: Plan for Procedure(s): LEFT TOTAL HIP ARTHROPLASTY ANTERIOR APPROACH  The risks benefits and alternatives were discussed with the patient including but not limited to the risks of nonoperative treatment, versus surgical intervention including infection, bleeding, nerve injury,  blood clots,  cardiopulmonary complications, morbidity, mortality, among others, and they were willing to proceed.   Preoperative templating of the joint replacement has been completed, documented, and submitted to the Operating Room personnel in order to optimize intra-operative equipment management.   Eduard Roux, MD   03/07/2018 7:25 AM

## 2018-03-07 NOTE — Anesthesia Preprocedure Evaluation (Addendum)
Anesthesia Evaluation  Patient identified by MRN, date of birth, ID band Patient awake    Reviewed: Allergy & Precautions, NPO status , Patient's Chart, lab work & pertinent test results  Airway Mallampati: II  TM Distance: >3 FB Neck ROM: Full    Dental no notable dental hx. (+) Edentulous Upper, Edentulous Lower   Pulmonary shortness of breath, pneumonia, Current Smoker,    Pulmonary exam normal breath sounds clear to auscultation       Cardiovascular Exercise Tolerance: Good hypertension, +CHF  Normal cardiovascular exam+ dysrhythmias Atrial Fibrillation  Rhythm:Regular Rate:Normal  ECHO 9/17 The cavity size was normal. Systolic function was normal. The estimated ejection fraction was in the range of 55% to 60%. Wall motion was normal; there were no regional wall motion abnormalities. The transmitral flow pattern was normal. The deceleration time of the early transmitral flow velocity was normal. The pulmonary vein flow pattern was normal. The tissue Doppler parameters were normal. Left ventricular diastolic function parameters were normal.   Neuro/Psych Anxiety    GI/Hepatic GERD  ,  Endo/Other    Renal/GU      Musculoskeletal  (+) Arthritis , Rheumatoid disorders,    Abdominal (+) + obese,   Peds  Hematology  (+) anemia ,   Anesthesia Other Findings   Reproductive/Obstetrics                           Anesthesia Physical Anesthesia Plan  ASA: III  Anesthesia Plan: Spinal   Post-op Pain Management:    Induction:   PONV Risk Score and Plan: Treatment may vary due to age or medical condition, Ondansetron and Dexamethasone  Airway Management Planned: Nasal Cannula and Natural Airway  Additional Equipment:   Intra-op Plan:   Post-operative Plan:   Informed Consent: I have reviewed the patients History and Physical, chart, labs and discussed the procedure including the  risks, benefits and alternatives for the proposed anesthesia with the patient or authorized representative who has indicated his/her understanding and acceptance.   Dental advisory given  Plan Discussed with: CRNA  Anesthesia Plan Comments: ( )       Anesthesia Quick Evaluation

## 2018-03-07 NOTE — Progress Notes (Signed)
PT Cancellation Note  Patient Details Name: Brian [name] MRN: [id number] DOB: 1950/10/15   Cancelled Treatment:    Reason Eval/Treat Not Completed: Pain limiting ability to participate.  Pt reporting he is in too much pain to participate.  Pt would like PT to check back tomorrow.  Thanks,    Barbarann Ehlers. Jeany Seville, PT, DPT  Acute Rehabilitation [phone] pager #[phone] office   03/07/2018, 4:05 PM

## 2018-03-07 NOTE — Anesthesia Procedure Notes (Addendum)
Spinal  Patient location during procedure: OR Start time: 03/07/2018 10:20 AM End time: 03/07/2018 10:25 AM Staffing Anesthesiologist: Barnet Glasgow, MD Performed: anesthesiologist  Preanesthetic Checklist Completed: patient identified, surgical consent, pre-op evaluation, timeout performed, IV checked, risks and benefits discussed and monitors and equipment checked Spinal Block Patient position: sitting Prep: site prepped and draped and DuraPrep Patient monitoring: heart rate, cardiac monitor, continuous pulse ox and blood pressure Approach: midline Location: L3-4 Injection technique: single-shot Needle Needle type: Pencan  Needle gauge: 24 G Needle length: 10 cm Needle insertion depth: 7 cm Assessment Sensory level: T4

## 2018-03-08 ENCOUNTER — Other Ambulatory Visit: Payer: Self-pay

## 2018-03-08 LAB — BASIC METABOLIC PANEL
Anion gap: 6 (ref 5–15)
BUN: 14 mg/dL (ref 8–23)
CHLORIDE: 105 mmol/L (ref 98–111)
CO2: 25 mmol/L (ref 22–32)
CREATININE: 0.73 mg/dL (ref 0.61–1.24)
Calcium: 7.8 mg/dL — ABNORMAL LOW (ref 8.9–10.3)
GFR calc Af Amer: >60 mL/min (ref >60)
GFR calc non Af Amer: >60 mL/min (ref >60)
GLUCOSE: 109 mg/dL — AB (ref 70–99)
Potassium: 5.2 mmol/L — ABNORMAL HIGH (ref 3.5–5.1)
Sodium: 136 mmol/L (ref 135–145)

## 2018-03-08 LAB — CBC
HCT: 29.2 % — ABNORMAL LOW (ref 39.0–52.0)
HEMOGLOBIN: 8.8 g/dL — AB (ref 13.0–17.0)
MCH: 27.6 pg (ref 26.0–34.0)
MCHC: 30.1 g/dL (ref 30.0–36.0)
MCV: 91.5 fL (ref 78.0–100.0)
Platelets: 227 10*3/uL (ref 150–400)
RBC: 3.19 MIL/uL — ABNORMAL LOW (ref 4.22–5.81)
RDW: 14.6 % (ref 11.5–15.5)
WBC: 10 10*3/uL (ref 4.0–10.5)

## 2018-03-08 MED ORDER — INFLUENZA VAC SPLIT HIGH-DOSE 0.5 ML IM SUSY
0.5000 mL | PREFILLED_SYRINGE | INTRAMUSCULAR | Status: DC
  Filled 2018-03-08: qty 0.5

## 2018-03-08 MED ORDER — FUROSEMIDE 40 MG PO TABS
40.0000 mg | ORAL_TABLET | Freq: Two times a day (BID) | ORAL | Status: DC
  Administered 2018-03-08 – 2018-03-10 (×3): 40 mg via ORAL
  Filled 2018-03-08 (×4): qty 1

## 2018-03-08 MED ORDER — FUROSEMIDE 40 MG PO TABS: 40.0000 mg | ORAL_TABLET | Freq: Two times a day (BID) | ORAL | Status: DC

## 2018-03-08 NOTE — Progress Notes (Addendum)
Subjective: 1 Day Post-Op Procedure(s) (LRB): LEFT TOTAL HIP ARTHROPLASTY ANTERIOR APPROACH (Left) Patient reports pain as moderate.  Mobilization limited by pain. No nausea/vomiting, lightheadedness/dizziness.    Objective: Vital signs in last 24 hours: Temp:  [97.3 F (36.3 C)-98.4 F (36.9 C)] 98.4 F (36.9 C) (09/10 0451) Pulse Rate:  [61-100] 98 (09/10 0451) Resp:  [15-26] 18 (09/10 0451) BP: (99-149)/(60-86) 113/66 (09/10 0451) SpO2:  [92 %-100 %] 92 % (09/10 0451) Weight:  [105.7 kg] 105.7 kg (09/09 0848)  Intake/Output from previous day: 09/09 0701 - 09/10 0700 In: 1975 [I.V.:1600; IV Piggyback:250] Out: 1100 [Urine:600; Blood:500] Intake/Output this shift: No intake/output data recorded.  Recent Labs    03/08/18 0440  HGB 8.8*   Recent Labs    03/08/18 0440  WBC 10.0  RBC 3.19*  HCT 29.2*  PLT 227   Recent Labs    03/08/18 0440  NA 136  K 5.2*  CL 105  CO2 25  BUN 14  CREATININE 0.73  GLUCOSE 109*  CALCIUM 7.8*   Recent Labs    03/07/18 0853  INR 0.97    Neurologically intact Neurovascular intact Sensation intact distally Intact pulses distally Dorsiflexion/Plantar flexion intact Incision: scant drainage No cellulitis present Compartment soft    Assessment/Plan: 1 Day Post-Op Procedure(s) (LRB): LEFT TOTAL HIP ARTHROPLASTY ANTERIOR APPROACH (Left) Advance diet Up with therapy D/C IV fluids  TDWB- NO HIP ABDUCTION ABLA-mild and stable     Brian [name] 03/08/2018, 7:46 AM

## 2018-03-08 NOTE — Discharge Instructions (Signed)
INSTRUCTIONS AFTER JOINT REPLACEMENT   o Remove items at home which could result in a fall. This includes throw rugs or furniture in walking pathways o ICE to the affected joint every three hours while awake for 30 minutes at a time, for at least the first 3-5 days, and then as needed for pain and swelling.  Continue to use ice for pain and swelling. You may notice swelling that will progress down to the foot and ankle.  This is normal after surgery.  Elevate your leg when you are not up walking on it.   o Continue to use the breathing machine you got in the hospital (incentive spirometer) which will help keep your temperature down.  It is common for your temperature to cycle up and down following surgery, especially at night when you are not up moving around and exerting yourself.  The breathing machine keeps your lungs expanded and your temperature down.   DIET:  As you were doing prior to hospitalization, we recommend a well-balanced diet.  DRESSING / WOUND CARE / SHOWERING  You may change your surgical dressing 7 days after surgery.  Then change the dressing every day with sterile gauze.  Please use good hand washing techniques before changing the dressing.  Do not use any lotions or creams on the incision until instructed by your surgeon.  You may shower while you have the surgical dressing which is waterproof.  After removal of surgical dressing, you must cover the incision when showering.  ACTIVITY  o Increase activity slowly as tolerated, but follow the weight bearing instructions below.   o No driving for 6 weeks or until further direction given by your physician.  You cannot drive while taking narcotics.  o No lifting or carrying greater than 10 lbs. until further directed by your surgeon. o Avoid periods of inactivity such as sitting longer than an hour when not asleep. This helps prevent blood clots.  o You may return to work once you are authorized by your doctor.     WEIGHT  BEARING   Weight bearing as tolerated with assist device (walker, cane, etc) as directed, use it as long as suggested by your surgeon or therapist, typically at least 4-6 weeks.   EXERCISES  Results after joint replacement surgery are often greatly improved when you follow the exercise, range of motion and muscle strengthening exercises prescribed by your doctor. Safety measures are also important to protect the joint from further injury. Any time any of these exercises cause you to have increased pain or swelling, decrease what you are doing until you are comfortable again and then slowly increase them. If you have problems or questions, call your caregiver or physical therapist for advice.   Rehabilitation is important following a joint replacement. After just a few days of immobilization, the muscles of the leg can become weakened and shrink (atrophy).  These exercises are designed to build up the tone and strength of the thigh and leg muscles and to improve motion. Often times heat used for twenty to thirty minutes before working out will loosen up your tissues and help with improving the range of motion but do not use heat for the first two weeks following surgery (sometimes heat can increase post-operative swelling).   These exercises can be done on a training (exercise) mat, on the floor, on a table or on a bed. Use whatever works the best and is most comfortable for you.    Use music or television while  you are exercising so that the exercises are a pleasant break in your day. This will make your life better with the exercises acting as a break in your routine that you can look forward to.   Perform all exercises about fifteen times, three times per day or as directed.  You should exercise both the operative leg and the other leg as well. ° °Exercises include: °  °• Quad Sets - Tighten up the muscle on the front of the thigh (Quad) and hold for 5-10 seconds.   °• Straight Leg Raises - With your  knee straight (if you were given a brace, keep it on), lift the leg to 60 degrees, hold for 3 seconds, and slowly lower the leg.  Perform this exercise against resistance later as your leg gets stronger.  °• Leg Slides: Lying on your back, slowly slide your foot toward your buttocks, bending your knee up off the floor (only go as far as is comfortable). Then slowly slide your foot back down until your leg is flat on the floor again.  °• Angel Wings: Lying on your back spread your legs to the side as far apart as you can without causing discomfort.  °• Hamstring Strength:  Lying on your back, push your heel against the floor with your leg straight by tightening up the muscles of your buttocks.  Repeat, but this time bend your knee to a comfortable angle, and push your heel against the floor.  You may put a pillow under the heel to make it more comfortable if necessary.  ° °A rehabilitation program following joint replacement surgery can speed recovery and prevent re-injury in the future due to weakened muscles. Contact your doctor or a physical therapist for more information on knee rehabilitation.  ° ° °CONSTIPATION ° °Constipation is defined medically as fewer than three stools per week and severe constipation as less than one stool per week.  Even if you have a regular bowel pattern at home, your normal regimen is likely to be disrupted due to multiple reasons following surgery.  Combination of anesthesia, postoperative narcotics, change in appetite and fluid intake all can affect your bowels.  ° °YOU MUST use at least one of the following options; they are listed in order of increasing strength to get the job done.  They are all available over the counter, and you may need to use some, POSSIBLY even all of these options:   ° °Drink plenty of fluids (prune juice may be helpful) and high fiber foods °Colace 100 mg by mouth twice a day  °Senokot for constipation as directed and as needed Dulcolax (bisacodyl), take  with full glass of water  °Miralax (polyethylene glycol) once or twice a day as needed. ° °If you have tried all these things and are unable to have a bowel movement in the first 3-4 days after surgery call either your surgeon or your primary doctor.   ° °If you experience loose stools or diarrhea, hold the medications until you stool forms back up.  If your symptoms do not get better within 1 week or if they get worse, check with your doctor.  If you experience "the worst abdominal pain ever" or develop nausea or vomiting, please contact the office immediately for further recommendations for treatment. ° ° °ITCHING:  If you experience itching with your medications, try taking only a single pain pill, or even half a pain pill at a time.  You can also use Benadryl over the counter   for itching or also to help with sleep.   TED HOSE STOCKINGS:  Use stockings on both legs until for at least 2 weeks or as directed by physician office. They may be removed at night for sleeping.  MEDICATIONS:  See your medication summary on the After Visit Summary that nursing will review with you.  You may have some home medications which will be placed on hold until you complete the course of blood thinner medication.  It is important for you to complete the blood thinner medication as prescribed.  PRECAUTIONS:  If you experience chest pain or shortness of breath - call 911 immediately for transfer to the hospital emergency department.   If you develop a fever greater that 101 F, purulent drainage from wound, increased redness or drainage from wound, foul odor from the wound/dressing, or calf pain - CONTACT YOUR SURGEON.                                                   FOLLOW-UP APPOINTMENTS:  If you do not already have a post-op appointment, please call the office for an appointment to be seen by your surgeon.  Guidelines for how soon to be seen are listed in your After Visit Summary, but are typically between 1-4 weeks  after surgery.  OTHER INSTRUCTIONS:   Knee Replacement:  Do not place pillow under knee, focus on keeping the knee straight while resting. CPM instructions: 0-90 degrees, 2 hours in the morning, 2 hours in the afternoon, and 2 hours in the evening. Place foam block, curve side up under heel at all times except when in CPM or when walking.  DO NOT modify, tear, cut, or change the foam block in any way.  MAKE SURE YOU:   Understand these instructions.   Get help right away if you are not doing well or get worse.    Thank you for letting us be a part of your medical care team.  It is a privilege we respect greatly.  We hope these instructions will help you stay on track for a fast and full recovery!   Information on my medicine - XARELTO (Rivaroxaban)  This medication education was reviewed with me or my healthcare representative as part of my discharge preparation. Why was Xarelto prescribed for you? Xarelto was prescribed for you to reduce the risk of a blood clot forming that can cause a stroke if you have a medical condition called atrial fibrillation (a type of irregular heartbeat).  What do you need to know about xarelto ? Take your Xarelto ONCE DAILY at the same time every day with your evening meal. If you have difficulty swallowing the tablet whole, you may crush it and mix in applesauce just prior to taking your dose.  Take Xarelto exactly as prescribed by your doctor and DO NOT stop taking Xarelto without talking to the doctor who prescribed the medication.  Stopping without other stroke prevention medication to take the place of Xarelto may increase your risk of developing a clot that causes a stroke.  Refill your prescription before you run out.  After discharge, you should have regular check-up appointments with your healthcare provider that is prescribing your Xarelto.  In the future your dose may need to be changed if your kidney function or weight changes by a  significant amount.  What  do you do if you miss a dose? If you are taking Xarelto ONCE DAILY and you miss a dose, take it as soon as you remember on the same day then continue your regularly scheduled once daily regimen the next day. Do not take two doses of Xarelto at the same time or on the same day.   Important Safety Information A possible side effect of Xarelto is bleeding. You should call your healthcare provider right away if you experience any of the following: ? Bleeding from an injury or your nose that does not stop. ? Unusual colored urine (red or dark brown) or unusual colored stools (red or black). ? Unusual bruising for unknown reasons. ? A serious fall or if you hit your head (even if there is no bleeding).  Some medicines may interact with Xarelto and might increase your risk of bleeding while on Xarelto. To help avoid this, consult your healthcare provider or pharmacist prior to using any new prescription or non-prescription medications, including herbals, vitamins, non-steroidal anti-inflammatory drugs (NSAIDs) and supplements.  This website has more information on Xarelto: [email or web address].[email or web address]

## 2018-03-08 NOTE — Plan of Care (Signed)
  Problem: Pain Managment: Goal: General experience of comfort will improve Outcome: Progressing   Problem: Safety: Goal: Ability to remain free from injury will improve Outcome: Progressing   

## 2018-03-08 NOTE — Care Management Note (Signed)
Case Management Note  Patient Details  Name: TOLBERT MATHESON MRN: [id number] Date of Birth: 02-Aug-1950  Subjective/Objective:   Left THA                 Action/Plan: NCM spoke to pt. He is requesting SNF rehab at [facility]. CSW referral for SNF. Pt's HH was arranged with [facility]. Will make [place] aware. He has RW and 3n1 bedside commode at home.   Expected Discharge Date:                Expected Discharge Plan:  Skilled Nursing Facility  In-House Referral:  Clinical Social Work  Discharge planning Services  CM Consult  Post Acute Care Choice:  NA Choice offered to:  NA  DME Arranged:  N/A DME Agency:  NA  HH Arranged:  NA HH Agency:  NA  Status of Service:  Completed, signed off  If discussed at [place] of Stay Meetings, dates discussed:    Additional Comments:  Erenest Rasher, RN 03/08/2018, 11:58 AM

## 2018-03-08 NOTE — Progress Notes (Signed)
Physical Therapy Treatment Patient Details Name: Brian [name] MRN: [id number] DOB: Jun 30, 1950 Today's Date: 03/08/2018    History of Present Illness 67 y.o. male admitted on 03/07/18 for elective L THA, operative complication of greater trochater fx requiring extra fixation and caging.  Post operatively TDWB and no hip abduction.  Pt with significant PMH of L TKA, R THA, anemia, anxiety, SOB, RA, HTN, gout, CHF, A-flutter (s/p cardioversion).      PT Comments    Pt is progressing well with gait, going a bit further into the hallway this session.  He continues to struggle with TDWB and keeping his feet inside of the RW.  Pt remains appropriate for SNF level rehab at discharge if surgeon and insurance allows.  PT will continue to follow acutely.    Follow Up Recommendations  SNF;Follow surgeon's recommendation for DC plan and follow-up therapies;Supervision for mobility/OOB(requesting [facility])     Equipment Recommendations  None recommended by PT    Recommendations for Other Services   NA     Precautions / Restrictions Precautions Precautions: Fall;Other (comment) Precaution Comments: no hip abduction Restrictions LLE Weight Bearing: Touchdown weight bearing Other Position/Activity Restrictions: No abduction    Mobility  Bed Mobility Overal bed mobility: Needs Assistance Bed Mobility: Supine to Sit     Supine to sit: Min assist;HOB elevated     General bed mobility comments: Min assist to help pt progress left leg to EOB, HOB elevated and pt using railing for leverage at his trunk.   Transfers Overall transfer level: Needs assistance Equipment used: Rolling walker (2 wheeled) Transfers: Sit to/from Stand Sit to Stand: Min assist;From elevated surface         General transfer comment: Min assist to support trunk to stand from elevated bed, pt wanting to pull with both hands on RW, assist to stabilize walker and cues for safe hand placement.    Ambulation/Gait Ambulation/Gait assistance: Min assist Gait Distance (Feet): 65 Feet Assistive device: Rolling walker (2 wheeled) Gait Pattern/deviations: Step-to pattern(hop to)     General Gait Details: Pt continues to have difficulty keeping TDWB on his left leg (more like PWB).  He also needs cues to keep both feet inside of RW during gait.           Balance Overall balance assessment: Needs assistance Sitting-balance support: Feet supported Sitting balance-Leahy Scale: Fair     Standing balance support: Bilateral upper extremity supported Standing balance-Leahy Scale: Poor                              Cognition Arousal/Alertness: Awake/alert Behavior During Therapy: WFL for tasks assessed/performed Overall Cognitive Status: Within Functional Limits for tasks assessed                                        Exercises Total Joint Exercises Ankle Circles/Pumps: AROM;Both;10 reps Quad Sets: AROM;Left;10 reps Heel Slides: AAROM;Left;10 reps        Pertinent Vitals/Pain Pain Assessment: Faces Faces Pain Scale: Hurts whole lot Pain Location: left leg Pain Descriptors / Indicators: Aching;Burning Pain Intervention(s): Limited activity within patient's tolerance;Monitored during session;Repositioned;Patient requesting pain meds-RN notified           PT Goals (current goals can now be found in the care plan section) Acute Rehab PT Goals Patient Stated Goal: to get better, decrease pain, regain  independence Progress towards PT goals: Progressing toward goals    Frequency    7X/week      PT Plan Current plan remains appropriate       AM-PAC PT "6 Clicks" Daily Activity  Outcome Measure  Difficulty turning over in bed (including adjusting bedclothes, sheets and blankets)?: Unable Difficulty moving from lying on back to sitting on the side of the bed? : Unable Difficulty sitting down on and standing up from a chair with arms  (e.g., wheelchair, bedside commode, etc,.)?: Unable Help needed moving to and from a bed to chair (including a wheelchair)?: A Little Help needed walking in hospital room?: A Little Help needed climbing 3-5 steps with a railing? : A Lot 6 Click Score: 11    End of Session   Activity Tolerance: Patient limited by pain;Patient limited by fatigue Patient left: in bed;Other (comment)(seated EOB to eat food his roommate brought him. )   PT Visit Diagnosis: Muscle weakness (generalized) (M62.81);Difficulty in walking, not elsewhere classified (R26.2);Pain Pain - Right/Left: Left Pain - part of body: Leg     Time: 0459-9774 PT Time Calculation (min) (ACUTE ONLY): 20 min  Charges:  $Gait Training: 8-22 mins          Sylwia Cuervo B. Twisha Vanpelt, PT, DPT  Acute Rehabilitation [phone] pager #[phone] office            03/08/2018, 3:59 PM

## 2018-03-08 NOTE — Evaluation (Addendum)
Physical Therapy Evaluation Patient Details Name: Brian [name] MRN: [id number] DOB: Oct 08, 1950 Today's Date: 03/08/2018   History of Present Illness  67 y.o. male admitted on 03/07/18 for elective L THA, operative complication of greater trochater fx requiring extra fixation and caging.  Post operatively TDWB and no hip abduction.  Pt with significant PMH of L TKA, R THA, anemia, anxiety, SOB, RA, HTN, gout, CHF, A-flutter (s/p cardioversion).    Clinical Impression  Pt was able to walk into the hallway, however, has difficulty maintaining TDWB (he does more like PWB) on his left leg.  He reports he will be unable to do this by himself at home given his decreased WB status and increased post operative pain.  PT requesting to see if pt is a candidate for SNF placement for post op rehab.  PT to follow acutely for deficits listed below.       Follow Up Recommendations SNF;Follow surgeon's recommendation for DC plan and follow-up therapies;Supervision for mobility/OOB(requesting [facility])    Equipment Recommendations  None recommended by PT    Recommendations for Other Services   NA    Precautions / Restrictions Precautions Precautions: Fall;Other (comment) Restrictions LLE Weight Bearing: Touchdown weight bearing Other Position/Activity Restrictions: No abduction      Mobility  Bed Mobility Overal bed mobility: Needs Assistance Bed Mobility: Supine to Sit     Supine to sit: Min assist     General bed mobility comments: Min assist to help progress left leg to EOB, went out of right side of the bed to avoid hip abduction.   Transfers Overall transfer level: Needs assistance Equipment used: Rolling walker (2 wheeled) Transfers: Sit to/from Stand Sit to Stand: Min assist         General transfer comment: Min assist to support trunk to get to standing and help stabilize RW during transition of hands.   Ambulation/Gait Ambulation/Gait assistance: Min assist Gait Distance  (Feet): 55 Feet Assistive device: Rolling walker (2 wheeled) Gait Pattern/deviations: Step-to pattern(hop to)     General Gait Details: Pt with difficulty maintaining TDWB status of L leg, especially with fatigue he is doing more like PWB.  Min assist for balance and to help maneuver RW         Balance Overall balance assessment: Needs assistance Sitting-balance support: Feet supported;Bilateral upper extremity supported Sitting balance-Leahy Scale: Fair     Standing balance support: Bilateral upper extremity supported Standing balance-Leahy Scale: Poor                               Pertinent Vitals/Pain Pain Assessment: Faces Faces Pain Scale: Hurts whole lot Pain Location: left leg Pain Descriptors / Indicators: Aching;Burning Pain Intervention(s): Limited activity within patient's tolerance;Monitored during session;Premedicated before session;Repositioned    Home Living Family/patient expects to be discharged to:: Skilled nursing facility(requesting [facility]) Living Arrangements: Non-relatives/Friends               Additional Comments: Pt reports his roomate has been assisting with bathing and toileting due to knee pain and RA    Prior Function Level of Independence: Needs assistance   Gait / Transfers Assistance Needed: mod Indep with SPC or RW           Hand Dominance   Dominant Hand: Right    Extremity/Trunk Assessment   Upper Extremity Assessment Upper Extremity Assessment: Overall WFL for tasks assessed    Lower Extremity Assessment Lower Extremity Assessment: LLE  deficits/detail LLE Deficits / Details: left leg with normal post op pain and weakness, ankle at least 3/5, knee 3-/5, hip 2/5    Cervical / Trunk Assessment Cervical / Trunk Assessment: Normal  Communication   Communication: No difficulties  Cognition Arousal/Alertness: Awake/alert Behavior During Therapy: Anxious Overall Cognitive Status: Within Functional Limits for  tasks assessed                                           Exercises Total Joint Exercises Ankle Circles/Pumps: AROM;Both;10 reps Quad Sets: AROM;Left;10 reps Gluteal Sets: (encouraged pt to do these) Long Arc Quad: AROM;Left;10 reps   Assessment/Plan    PT Assessment Patient needs continued PT services  PT Problem List Decreased strength;Decreased range of motion;Decreased activity tolerance;Decreased balance;Decreased mobility;Decreased knowledge of use of DME;Decreased knowledge of precautions;Pain       PT Treatment Interventions DME instruction;Gait training;Stair training;Functional mobility training;Therapeutic activities;Therapeutic exercise;Balance training;Patient/family education;Manual techniques;Modalities    PT Goals (Current goals can be found in the Care Plan section)  Acute Rehab PT Goals Patient Stated Goal: to get better, decrease pain, regain independence PT Goal Formulation: With patient Time For Goal Achievement: 03/15/18 Potential to Achieve Goals: Good    Frequency 7X/week           AM-PAC PT "6 Clicks" Daily Activity  Outcome Measure Difficulty turning over in bed (including adjusting bedclothes, sheets and blankets)?: Unable Difficulty moving from lying on back to sitting on the side of the bed? : Unable Difficulty sitting down on and standing up from a chair with arms (e.g., wheelchair, bedside commode, etc,.)?: Unable Help needed moving to and from a bed to chair (including a wheelchair)?: A Little Help needed walking in hospital room?: A Little Help needed climbing 3-5 steps with a railing? : A Lot 6 Click Score: 11    End of Session Equipment Utilized During Treatment: Gait belt Activity Tolerance: Patient limited by pain Patient left: in chair;with call bell/phone within reach;with family/visitor present   PT Visit Diagnosis: Muscle weakness (generalized) (M62.81);Difficulty in walking, not elsewhere classified  (R26.2);Pain Pain - Right/Left: Left Pain - part of body: Leg    Time: 1024-1050 PT Time Calculation (min) (ACUTE ONLY): 26 min   Charges:          Wells Guiles B. Pasty Manninen, PT, DPT  Acute Rehabilitation [phone] pager #[phone] office   PT Evaluation $PT Eval Moderate Complexity: 1 Mod PT Treatments $Gait Training: 8-22 mins        03/08/2018, 12:01 PM

## 2018-03-08 NOTE — Anesthesia Postprocedure Evaluation (Signed)
Anesthesia Post Note  Patient: Brian [name]  Procedure(s) Performed: LEFT TOTAL HIP ARTHROPLASTY ANTERIOR APPROACH (Left Hip)     Patient location during evaluation: PACU Anesthesia Type: Spinal Level of consciousness: oriented and awake and alert Pain management: pain level controlled Vital Signs Assessment: post-procedure vital signs reviewed and stable Respiratory status: spontaneous breathing, respiratory function stable and patient connected to nasal cannula oxygen Cardiovascular status: blood pressure returned to baseline and stable Postop Assessment: no headache, no backache and no apparent nausea or vomiting Anesthetic complications: no    Last Vitals:  Vitals:   03/07/18 2138 03/08/18 0451  BP: 111/60 113/66  Pulse: 94 98  Resp: 18 18  Temp:  36.9 C  SpO2: 96% 92%    Last Pain:  Vitals:   03/08/18 0854  TempSrc:   PainSc: 9                  Barnet Glasgow

## 2018-03-08 NOTE — NC FL2 (Signed)
[place] LEVEL OF CARE SCREENING TOOL     IDENTIFICATION  Patient Name: Brian [name] Birthdate: 07-09-1950 Sex: male Admission Date (Current Location): 03/07/2018  [facility] and [place] Number:  Herbalist and Address:  [facility], [place] [address] [place], [place] [address]      Provider Number: [id number]  Attending Physician Name and Address:  Leandrew Koyanagi, MD  Relative Name and Phone Number:  Manya Silvas [phone]    Current Level of Care: Hospital Recommended Level of Care: [place] Prior Approval Number:    Date Approved/Denied:   PASRR Number: [id number]  Discharge Plan: SNF    Current Diagnoses: Patient Active Problem List   Diagnosis Date Noted  . Primary osteoarthritis of left hip 03/07/2018  . History of hip replacement 03/07/2018  . 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 Cellulitis 01/22/2017  . Polyarthritis 12/01/2016  . Cellulitis of left leg 11/29/2016  . Sepsis ([place]) 11/29/2016  . Effusion, left knee 11/13/2016  . Persistent atrial fibrillation ([place])   . Dyspnea   . Positive urine drug screen   . CAP (community acquired pneumonia) 09/30/2016  . Community acquired pneumonia of left lower lobe of lung ([place])   . Pleural effusion   . Tachypnea   . CVA (cerebral infarction) 09/27/2015  . Right hand pain 09/27/2015  . History of gout 09/27/2015  . Cocaine abuse ([place]) 09/27/2015  . TIA (transient ischemic attack) 09/27/2015  . Slurred speech   . Primary osteoarthritis of right hand   . Leukocytosis   . Essential hypertension   . Chronic diastolic CHF (congestive heart failure) ([place])   . HTN (hypertension) 11/22/2013  . Chronic diastolic heart failure ([place]) 11/22/2013  . Noncompliance 11/17/2013  . Atrial flutter with rapid ventricular response ([place]) 11/16/2013  . Acute on chronic diastolic CHF (congestive heart  failure) ([place]) 09/05/2012  . Tobacco abuse   . Obesity- BMI 35   . Diastolic CHF ([place])   . Atrial flutter ([place])   . Anxiety     Orientation RESPIRATION BLADDER Height & Weight     Self, Time, Situation, Place  Normal Continent Weight: 233 lb (105.7 kg) Height:  5\' 10"  (177.8 cm)  BEHAVIORAL SYMPTOMS/MOOD NEUROLOGICAL BOWEL NUTRITION STATUS      Continent Diet  AMBULATORY STATUS COMMUNICATION OF NEEDS Skin   Limited Assist Verbally Surgical wounds(Surgical incision closed on left hip and knee)                       Personal Care Assistance Level of Assistance  Bathing, Feeding, Dressing, Total care Bathing Assistance: Limited assistance Feeding assistance: Limited assistance Dressing Assistance: Limited assistance Total Care Assistance: Limited assistance   Functional Limitations Info  Sight, Hearing, Speech Sight Info: Adequate Hearing Info: Adequate Speech Info: Adequate    SPECIAL CARE FACTORS FREQUENCY  PT (By licensed PT), OT (By licensed OT)     PT Frequency: 2x weekly OT Frequency: 2x weekly            Contractures Contractures Info: Not present    Additional Factors Info  Code Status, Allergies, Isolation Precautions Code Status Info: Full code Allergies Info: Tape     Isolation Precautions Info: MRSA surgical PCR+     Current Medications (03/08/2018):  This is the current hospital active medication list Current Facility-Administered Medications  Medication Dose Route Frequency Provider Last  Rate Last Dose  . 0.9 %  sodium chloride infusion   Intravenous Continuous Leandrew Koyanagi, MD 75 mL/hr at 03/08/18 0854    . acetaminophen (TYLENOL) tablet 325-650 mg  325-650 mg Oral Q6H PRN Leandrew Koyanagi, MD      . alum & mag hydroxide-simeth (MAALOX/MYLANTA) 200-200-20 MG/5ML suspension 30 mL  30 mL Oral Q4H PRN Leandrew Koyanagi, MD      . diphenhydrAMINE (BENADRYL) 12.5 MG/5ML elixir 25 mg  25 mg Oral Q4H PRN Leandrew Koyanagi, MD      . docusate sodium (COLACE)  capsule 100 mg  100 mg Oral BID Leandrew Koyanagi, MD   100 mg at 03/08/18 0856  . furosemide (LASIX) tablet 40 mg  40 mg Oral BID Leandrew Koyanagi, MD   40 mg at 03/08/18 1557  . gabapentin (NEURONTIN) capsule 300 mg  300 mg Oral TID Leandrew Koyanagi, MD   300 mg at 03/08/18 0856  . HYDROmorphone (DILAUDID) injection 0.5-1 mg  0.5-1 mg Intravenous Q4H PRN Leandrew Koyanagi, MD   1 mg at 03/08/18 0854  . lactated ringers infusion   Intravenous Continuous Leandrew Koyanagi, MD   Stopped at 03/07/18 1801  . magnesium citrate solution 1 Bottle  1 Bottle Oral Once PRN Leandrew Koyanagi, MD      . menthol-cetylpyridinium (CEPACOL) lozenge 3 mg  1 lozenge Oral PRN Leandrew Koyanagi, MD       Or  . phenol (CHLORASEPTIC) mouth spray 1 spray  1 spray Mouth/Throat PRN Leandrew Koyanagi, MD      . methocarbamol (ROBAXIN) tablet 500 mg  500 mg Oral Q6H PRN Leandrew Koyanagi, MD   500 mg at 03/08/18 1557   Or  . methocarbamol (ROBAXIN) 500 mg in dextrose 5 % 50 mL IVPB  500 mg Intravenous Q6H PRN Leandrew Koyanagi, MD      . Derrill Memo ON 03/09/2018] methotrexate (RHEUMATREX) tablet 20 mg  20 mg Oral Q Wed Xu, Naiping M, MD      . metoCLOPramide (REGLAN) tablet 5-10 mg  5-10 mg Oral Q8H PRN Leandrew Koyanagi, MD       Or  . metoCLOPramide (REGLAN) injection 5-10 mg  5-10 mg Intravenous Q8H PRN Leandrew Koyanagi, MD      . ondansetron [facility]) tablet 4 mg  4 mg Oral Q6H PRN Leandrew Koyanagi, MD       Or  . ondansetron [facility]) injection 4 mg  4 mg Intravenous Q6H PRN Leandrew Koyanagi, MD      . oxyCODONE (Oxy IR/ROXICODONE) immediate release tablet 10-15 mg  10-15 mg Oral Q4H PRN Leandrew Koyanagi, MD   15 mg at 03/08/18 1557  . oxyCODONE (Oxy IR/ROXICODONE) immediate release tablet 5-10 mg  5-10 mg Oral Q4H PRN Leandrew Koyanagi, MD      . oxyCODONE (OXYCONTIN) 12 hr tablet 10 mg  10 mg Oral Q12H Leandrew Koyanagi, MD   10 mg at 03/08/18 1029  . polyethylene glycol (MIRALAX / GLYCOLAX) packet 17 g  17 g Oral Daily PRN Leandrew Koyanagi, MD      . rivaroxaban [facility]) tablet 20  mg  20 mg Oral Q supper Leandrew Koyanagi, MD      . sorbitol 70 % solution 30 mL  30 mL Oral Daily PRN Leandrew Koyanagi, MD      . sulfamethoxazole-trimethoprim (BACTRIM DS,SEPTRA DS) 800-160 MG per tablet 1 tablet  1 tablet Oral Q12H Leandrew Koyanagi, MD   1 tablet at 03/08/18 0856  . vancomycin (VANCOCIN) 1,500 mg in sodium chloride 0.9 % 500 mL IVPB  1,500 mg Intravenous Q12H Leandrew Koyanagi, MD 250 mL/hr at 03/08/18 0858 1,500 mg at 03/08/18 6945     Discharge Medications: Please see discharge summary for a list of discharge medications.  Relevant Imaging Results:  Relevant Lab Results:   Additional Information SSN: [id number]  Alberteen Sam, LCSW

## 2018-03-09 ENCOUNTER — Encounter (HOSPITAL_COMMUNITY): Payer: Self-pay | Admitting: Orthopaedic Surgery

## 2018-03-09 LAB — CBC
HCT: 24.6 % — ABNORMAL LOW (ref 39.0–52.0)
HEMOGLOBIN: 7.7 g/dL — AB (ref 13.0–17.0)
MCH: 28.1 pg (ref 26.0–34.0)
MCHC: 31.3 g/dL (ref 30.0–36.0)
MCV: 89.8 fL (ref 78.0–100.0)
PLATELETS: 279 10*3/uL (ref 150–400)
RBC: 2.74 MIL/uL — AB (ref 4.22–5.81)
RDW: 14.8 % (ref 11.5–15.5)
WBC: 18.7 10*3/uL — ABNORMAL HIGH (ref 4.0–10.5)

## 2018-03-09 LAB — HEMOGLOBIN AND HEMATOCRIT, BLOOD
HEMATOCRIT: 27.9 % — AB (ref 39.0–52.0)
HEMOGLOBIN: 8.6 g/dL — AB (ref 13.0–17.0)

## 2018-03-09 LAB — PREPARE RBC (CROSSMATCH)

## 2018-03-09 MED ORDER — FUROSEMIDE 10 MG/ML IJ SOLN
20.0000 mg | Freq: Once | INTRAMUSCULAR | Status: AC
  Administered 2018-03-09: 20 mg via INTRAVENOUS
  Filled 2018-03-09: qty 2

## 2018-03-09 MED ORDER — INFLUENZA VAC SPLIT HIGH-DOSE 0.5 ML IM SUSY
0.5000 mL | PREFILLED_SYRINGE | INTRAMUSCULAR | Status: AC
  Administered 2018-03-09: 0.5 mL via INTRAMUSCULAR
  Filled 2018-03-09: qty 0.5

## 2018-03-09 MED ORDER — SODIUM CHLORIDE 0.9% IV SOLUTION
Freq: Once | INTRAVENOUS | Status: AC
  Administered 2018-03-09: 14:00:00 via INTRAVENOUS

## 2018-03-09 NOTE — Progress Notes (Signed)
Subjective: 2 Days Post-Op Procedure(s) (LRB): LEFT TOTAL HIP ARTHROPLASTY ANTERIOR APPROACH (Left) Patient reports pain as mild.  Doing well this am.   Objective: Vital signs in last 24 hours: Temp:  [98.2 F (36.8 C)-98.6 F (37 C)] 98.2 F (36.8 C) (09/11 0551) Pulse Rate:  [85-90] 85 (09/11 0551) Resp:  [17-18] 17 (09/11 0551) BP: (116-136)/(67-87) 116/67 (09/11 0551) SpO2:  [96 %-97 %] 97 % (09/11 0551)  Intake/Output from previous day: 09/10 0701 - 09/11 0700 In: 1210 [P.O.:1200; I.V.:10] Out: 1750 [Urine:1750] Intake/Output this shift: Total I/O In: -  Out: 400 [Urine:400]  Recent Labs    03/08/18 0440  HGB 8.8*   Recent Labs    03/08/18 0440  WBC 10.0  RBC 3.19*  HCT 29.2*  PLT 227   Recent Labs    03/08/18 0440  NA 136  K 5.2*  CL 105  CO2 25  BUN 14  CREATININE 0.73  GLUCOSE 109*  CALCIUM 7.8*   Recent Labs    03/07/18 0853  INR 0.97    Neurologically intact Neurovascular intact Sensation intact distally Intact pulses distally Dorsiflexion/Plantar flexion intact Incision: scant drainage No cellulitis present Compartment soft     Assessment/Plan: 2 Days Post-Op Procedure(s) (LRB): LEFT TOTAL HIP ARTHROPLASTY ANTERIOR APPROACH (Left) Advance diet Up with therapy D/C IV fluids Plan for discharge tomorrow to SNF TDWB LLE- NO HIP ABDUCTION ABLA- mild and stable Will plan for dressing change tomorrow prior to d/c    Aundra Dubin 03/09/2018, 8:17 AM

## 2018-03-09 NOTE — Plan of Care (Signed)

## 2018-03-09 NOTE — Progress Notes (Signed)
Physical Therapy Treatment Patient Details Name: Brian [name] MRN: [id number] DOB: 06-24-1951 Today's Date: 03/09/2018    History of Present Illness 67 y.o. male admitted on 03/07/18 for elective L THA, operative complication of greater trochater fx requiring extra fixation and caging.  Post operatively TDWB and no hip abduction.  Pt with significant PMH of L TKA, R THA, anemia, anxiety, SOB, RA, HTN, gout, CHF, A-flutter (s/p cardioversion).      PT Comments    Pt is stiff and sore today after two sessions of PT yesterday and being in the bed most of the AM today.  He was also due pain meds, so RN notified he would like them at the end of our session.  He was able to get up and do short distance gait with his RW down the hallway, but not as far as yesterday.  He continues to struggle to comply with TDWB status and by the end of our walk ends up being PWB.  He needs frequent cues to stay inside of the RW as well to help maintain his WB status. PT will continue to follow acutely to help facilitate safe mobility progression.   Follow Up Recommendations  SNF;Follow surgeon's recommendation for DC plan and follow-up therapies;Supervision for mobility/OOB(requesting [facility])     Equipment Recommendations  None recommended by PT    Recommendations for Other Services   NA     Precautions / Restrictions Precautions Precautions: Fall;Other (comment) Precaution Comments: no hip abduction Restrictions LLE Weight Bearing: Touchdown weight bearing Other Position/Activity Restrictions: No abduction    Mobility  Bed Mobility Overal bed mobility: Needs Assistance Bed Mobility: Supine to Sit     Supine to sit: Min assist;HOB elevated     General bed mobility comments: Min assist out of the right side of the bed to discourage hip abduction, min assist to help progress his left leg to EOB.    Transfers Overall transfer level: Needs assistance Equipment used: Rolling walker (2  wheeled) Transfers: Sit to/from Stand Sit to Stand: Min assist;From elevated surface         General transfer comment: Min assist to support trunk and stabilize RW during transitions.  Verbal cues for safe hand placement.   Ambulation/Gait Ambulation/Gait assistance: Min assist Gait Distance (Feet): 55 Feet Assistive device: Rolling walker (2 wheeled) Gait Pattern/deviations: Step-to pattern;Antalgic Gait velocity: decreased Gait velocity interpretation: <1.31 ft/sec, indicative of household ambulator General Gait Details: Pt with moderately antalgic gait pattern today, reporting that he is a bit more stiff and sore than yesterday (he is also dur pain meds).  He continues to have difficulty with TDWB status and as he fatigues he progresses to [facility].  Cues to keep his trunk inside of the RW          Balance Overall balance assessment: Needs assistance Sitting-balance support: Feet supported;Bilateral upper extremity supported Sitting balance-Leahy Scale: Fair     Standing balance support: Bilateral upper extremity supported Standing balance-Leahy Scale: Poor Standing balance comment: needs external support from RW in standing.                             Cognition Arousal/Alertness: Awake/alert Behavior During Therapy: WFL for tasks assessed/performed Overall Cognitive Status: Within Functional Limits for tasks assessed  Exercises Total Joint Exercises Ankle Circles/Pumps: AROM;Both;20 reps Quad Sets: AROM;Left;10 reps Gluteal Sets: AROM;Both;10 reps        Pertinent Vitals/Pain Pain Assessment: Faces Faces Pain Scale: Hurts whole lot Pain Location: left leg Pain Descriptors / Indicators: Aching;Burning Pain Intervention(s): Limited activity within patient's tolerance;Monitored during session;Repositioned;Patient requesting pain meds-RN notified           PT Goals (current goals can now be found  in the care plan section) Acute Rehab PT Goals Patient Stated Goal: to get better, decrease pain, regain independence Progress towards PT goals: Progressing toward goals    Frequency    7X/week      PT Plan Current plan remains appropriate       AM-PAC PT "6 Clicks" Daily Activity  Outcome Measure  Difficulty turning over in bed (including adjusting bedclothes, sheets and blankets)?: Unable Difficulty moving from lying on back to sitting on the side of the bed? : Unable Difficulty sitting down on and standing up from a chair with arms (e.g., wheelchair, bedside commode, etc,.)?: Unable Help needed moving to and from a bed to chair (including a wheelchair)?: A Little Help needed walking in hospital room?: A Little Help needed climbing 3-5 steps with a railing? : A Lot 6 Click Score: 11    End of Session Equipment Utilized During Treatment: Gait belt Activity Tolerance: Patient limited by pain;Patient limited by fatigue Patient left: in chair;with call bell/phone within reach;with family/visitor present Nurse Communication: Mobility status;Patient requests pain meds PT Visit Diagnosis: Muscle weakness (generalized) (M62.81);Difficulty in walking, not elsewhere classified (R26.2);Pain Pain - Right/Left: Left Pain - part of body: Leg     Time: 1230-1250 PT Time Calculation (min) (ACUTE ONLY): 20 min  Charges:  $Gait Training: 8-22 mins          Letoya Stallone B. Milo Schreier, PT, DPT  Acute Rehabilitation [phone] pager #[phone] office             03/09/2018, 1:23 PM

## 2018-03-09 NOTE — Progress Notes (Signed)
Physical Therapy Treatment Patient Details Name: Brian [name] MRN: [id number] DOB: 1950-07-16 Today's Date: 03/09/2018    History of Present Illness 67 y.o. male admitted on 03/07/18 for elective L THA, operative complication of greater trochater fx requiring extra fixation and caging.  Post operatively TDWB and no hip abduction.  Pt with significant PMH of L TKA, R THA, anemia, anxiety, SOB, RA, HTN, gout, CHF, A-flutter (s/p cardioversion).      PT Comments    Pt refusing OOB mobility secondary to increased pain. Was agreeable to performing supine HEP, so reviewed supine HEP with pt. Current recommendations appropriate. Will continue to follow acutely to maximize functional mobility independence and safety.    Follow Up Recommendations  SNF;Follow surgeon's recommendation for DC plan and follow-up therapies;Supervision for mobility/OOB(requesting [facility] )     Equipment Recommendations  None recommended by PT    Recommendations for Other Services       Precautions / Restrictions Precautions Precautions: Fall;Other (comment) Precaution Comments: no hip abduction Restrictions Weight Bearing Restrictions: Yes LLE Weight Bearing: Touchdown weight bearing Other Position/Activity Restrictions: No abduction    Mobility  Bed Mobility               General bed mobility comments: Pt refusing OOB mobility, but was agreeable to performing supine HEP.   Transfers                    Ambulation/Gait                 Stairs             Wheelchair Mobility    Modified Rankin (Stroke Patients Only)       Balance                                            Cognition Arousal/Alertness: Awake/alert Behavior During Therapy: WFL for tasks assessed/performed Overall Cognitive Status: Within Functional Limits for tasks assessed                                        Exercises Total Joint Exercises Ankle  Circles/Pumps: AROM;Both;20 reps Quad Sets: AROM;Left;10 reps Gluteal Sets: AROM;Both;10 reps Short Arc Quad: AROM;Left;10 reps Heel Slides: AAROM;Left;10 reps    General Comments General comments (skin integrity, edema, etc.): Pt able to recall appropriate precautions.       Pertinent Vitals/Pain Pain Assessment: Faces Faces Pain Scale: Hurts even more Pain Location: left leg Pain Descriptors / Indicators: Aching;Burning Pain Intervention(s): Limited activity within patient's tolerance;Monitored during session;Repositioned    Home Living                      Prior Function            PT Goals (current goals can now be found in the care plan section) Acute Rehab PT Goals Patient Stated Goal: to get better, decrease pain, regain independence PT Goal Formulation: With patient Time For Goal Achievement: 03/15/18 Potential to Achieve Goals: Good Progress towards PT goals: Progressing toward goals    Frequency    7X/week      PT Plan Current plan remains appropriate    Co-evaluation              AM-PAC PT "6  Clicks" Daily Activity  Outcome Measure  Difficulty turning over in bed (including adjusting bedclothes, sheets and blankets)?: Unable Difficulty moving from lying on back to sitting on the side of the bed? : Unable Difficulty sitting down on and standing up from a chair with arms (e.g., wheelchair, bedside commode, etc,.)?: Unable Help needed moving to and from a bed to chair (including a wheelchair)?: A Little Help needed walking in hospital room?: A Little Help needed climbing 3-5 steps with a railing? : A Lot 6 Click Score: 11    End of Session   Activity Tolerance: Patient limited by pain Patient left: in bed;with call bell/phone within reach Nurse Communication: Mobility status PT Visit Diagnosis: Muscle weakness (generalized) (M62.81);Difficulty in walking, not elsewhere classified (R26.2);Pain Pain - Right/Left: Left Pain - part of  body: Leg     Time: 3794-3276 PT Time Calculation (min) (ACUTE ONLY): 14 min  Charges:  $Therapeutic Exercise: 8-22 mins                     Leighton Ruff, PT, DPT  Acute Rehabilitation Services  Pager: [phone] Office: [phone]    Rudean Hitt 03/09/2018, 5:54 PM

## 2018-03-10 ENCOUNTER — Telehealth (INDEPENDENT_AMBULATORY_CARE_PROVIDER_SITE_OTHER): Payer: Self-pay | Admitting: Physician Assistant

## 2018-03-10 ENCOUNTER — Inpatient Hospital Stay (HOSPITAL_COMMUNITY): Payer: Medicare Other

## 2018-03-10 DIAGNOSIS — Z471 Aftercare following joint replacement surgery: Secondary | ICD-10-CM | POA: Diagnosis not present

## 2018-03-10 DIAGNOSIS — R609 Edema, unspecified: Secondary | ICD-10-CM | POA: Diagnosis not present

## 2018-03-10 DIAGNOSIS — Z23 Encounter for immunization: Secondary | ICD-10-CM | POA: Diagnosis not present

## 2018-03-10 DIAGNOSIS — Z96642 Presence of left artificial hip joint: Secondary | ICD-10-CM | POA: Diagnosis not present

## 2018-03-10 DIAGNOSIS — M79609 Pain in unspecified limb: Secondary | ICD-10-CM | POA: Diagnosis not present

## 2018-03-10 DIAGNOSIS — I959 Hypotension, unspecified: Secondary | ICD-10-CM | POA: Diagnosis not present

## 2018-03-10 DIAGNOSIS — M6281 Muscle weakness (generalized): Secondary | ICD-10-CM | POA: Diagnosis not present

## 2018-03-10 DIAGNOSIS — M069 Rheumatoid arthritis, unspecified: Secondary | ICD-10-CM | POA: Diagnosis not present

## 2018-03-10 DIAGNOSIS — M2550 Pain in unspecified joint: Secondary | ICD-10-CM | POA: Diagnosis not present

## 2018-03-10 DIAGNOSIS — R2689 Other abnormalities of gait and mobility: Secondary | ICD-10-CM | POA: Diagnosis not present

## 2018-03-10 DIAGNOSIS — K5900 Constipation, unspecified: Secondary | ICD-10-CM | POA: Diagnosis not present

## 2018-03-10 DIAGNOSIS — Z743 Need for continuous supervision: Secondary | ICD-10-CM | POA: Diagnosis not present

## 2018-03-10 DIAGNOSIS — D473 Essential (hemorrhagic) thrombocythemia: Secondary | ICD-10-CM | POA: Diagnosis not present

## 2018-03-10 DIAGNOSIS — I4891 Unspecified atrial fibrillation: Secondary | ICD-10-CM | POA: Diagnosis not present

## 2018-03-10 DIAGNOSIS — L03818 Cellulitis of other sites: Secondary | ICD-10-CM | POA: Diagnosis not present

## 2018-03-10 DIAGNOSIS — R0602 Shortness of breath: Secondary | ICD-10-CM | POA: Diagnosis not present

## 2018-03-10 DIAGNOSIS — Z7401 Bed confinement status: Secondary | ICD-10-CM | POA: Diagnosis not present

## 2018-03-10 DIAGNOSIS — M1612 Unilateral primary osteoarthritis, left hip: Secondary | ICD-10-CM | POA: Diagnosis not present

## 2018-03-10 DIAGNOSIS — I10 Essential (primary) hypertension: Secondary | ICD-10-CM | POA: Diagnosis not present

## 2018-03-10 DIAGNOSIS — R279 Unspecified lack of coordination: Secondary | ICD-10-CM | POA: Diagnosis not present

## 2018-03-10 DIAGNOSIS — D72828 Other elevated white blood cell count: Secondary | ICD-10-CM | POA: Diagnosis not present

## 2018-03-10 DIAGNOSIS — R498 Other voice and resonance disorders: Secondary | ICD-10-CM | POA: Diagnosis not present

## 2018-03-10 DIAGNOSIS — Z96643 Presence of artificial hip joint, bilateral: Secondary | ICD-10-CM | POA: Diagnosis not present

## 2018-03-10 DIAGNOSIS — R262 Difficulty in walking, not elsewhere classified: Secondary | ICD-10-CM | POA: Diagnosis not present

## 2018-03-10 DIAGNOSIS — I5030 Unspecified diastolic (congestive) heart failure: Secondary | ICD-10-CM | POA: Diagnosis not present

## 2018-03-10 DIAGNOSIS — Z96652 Presence of left artificial knee joint: Secondary | ICD-10-CM | POA: Diagnosis not present

## 2018-03-10 DIAGNOSIS — R5381 Other malaise: Secondary | ICD-10-CM | POA: Diagnosis not present

## 2018-03-10 DIAGNOSIS — M25552 Pain in left hip: Secondary | ICD-10-CM | POA: Diagnosis not present

## 2018-03-10 DIAGNOSIS — Z9889 Other specified postprocedural states: Secondary | ICD-10-CM | POA: Diagnosis not present

## 2018-03-10 DIAGNOSIS — R4781 Slurred speech: Secondary | ICD-10-CM | POA: Diagnosis not present

## 2018-03-10 DIAGNOSIS — R278 Other lack of coordination: Secondary | ICD-10-CM | POA: Diagnosis not present

## 2018-03-10 DIAGNOSIS — G459 Transient cerebral ischemic attack, unspecified: Secondary | ICD-10-CM | POA: Diagnosis not present

## 2018-03-10 DIAGNOSIS — R300 Dysuria: Secondary | ICD-10-CM | POA: Diagnosis not present

## 2018-03-10 DIAGNOSIS — D649 Anemia, unspecified: Secondary | ICD-10-CM | POA: Diagnosis not present

## 2018-03-10 DIAGNOSIS — E876 Hypokalemia: Secondary | ICD-10-CM | POA: Diagnosis not present

## 2018-03-10 LAB — TYPE AND SCREEN
ABO/RH(D): A POS
Antibody Screen: NEGATIVE
UNIT DIVISION: 0

## 2018-03-10 LAB — BASIC METABOLIC PANEL
ANION GAP: 6 (ref 5–15)
BUN: 12 mg/dL (ref 8–23)
CO2: 28 mmol/L (ref 22–32)
Calcium: 7.9 mg/dL — ABNORMAL LOW (ref 8.9–10.3)
Chloride: 100 mmol/L (ref 98–111)
Creatinine, Ser: 0.6 mg/dL — ABNORMAL LOW (ref 0.61–1.24)
Glucose, Bld: 143 mg/dL — ABNORMAL HIGH (ref 70–99)
Potassium: 4.3 mmol/L (ref 3.5–5.1)
SODIUM: 134 mmol/L — AB (ref 135–145)

## 2018-03-10 LAB — BPAM RBC
Blood Product Expiration Date: 201910062359
ISSUE DATE / TIME: 201909111407
UNIT TYPE AND RH: 6200

## 2018-03-10 MED ORDER — FAMOTIDINE IN NACL 20-0.9 MG/50ML-% IV SOLN: 20.0000 mg | Freq: Two times a day (BID) | INTRAVENOUS | Status: DC

## 2018-03-10 NOTE — Progress Notes (Signed)
Patient discharged via PTAR to rehab, all belongings with patient upon discharge, paperwork given to EMS.

## 2018-03-10 NOTE — Clinical Social Work Placement (Signed)
   CLINICAL SOCIAL WORK PLACEMENT  NOTE  Date:  03/10/2018  Patient Details  Name: Brian [name] MRN: [id number] Date of Birth: 03-Nov-1950  Clinical Social Work is seeking post-discharge placement for this patient at the [place] level of care (*CSW will initial, date and re-position this form in  chart as items are completed):  Yes   Patient/family provided with [place] Work Department's list of facilities offering this level of care within the geographic area requested by the patient (or if unable, by the patient's family).  Yes   Patient/family informed of their freedom to choose among providers that offer the needed level of care, that participate in Medicare, Medicaid or managed care program needed by the patient, have an available bed and are willing to accept the patient.  Yes   Patient/family informed of [facility]'s ownership interest in [facility] and [facility], as well as of the fact that they are under no obligation to receive care at these facilities.  PASRR submitted to EDS on       PASRR number received on 03/08/18     Existing PASRR number confirmed on       FL2 transmitted to all facilities in geographic area requested by pt/family on       FL2 transmitted to all facilities within larger geographic area on       Patient informed that his/her managed care company has contracts with or will negotiate with certain facilities, including the following:            Patient/family informed of bed offers received.  Patient chooses bed at [facility]     Physician recommends and patient chooses bed at      Patient to be transferred to [facility] on 03/10/18.  Patient to be transferred to facility by PTAR     Patient family notified on 03/10/18 of transfer.  Name of family member notified:  [place]      PHYSICIAN       Additional Comment:    _______________________________________________ Vinie Sill, [place] 03/10/2018, 11:29 AM

## 2018-03-10 NOTE — Care Management Important Message (Signed)
Important Message  Patient Details  Name: Brian [name] MRN: [id number] Date of Birth: 08/05/1950   Medicare Important Message Given:  Yes    Erenest Rasher, RN 03/10/2018, 1:22 PM

## 2018-03-10 NOTE — Clinical Social Work Note (Signed)
Clinical Social Work Assessment  Patient Details  Name: Brian [name] MRN: [id number] Date of Birth: 1950-08-01  Date of referral:  03/10/18               Reason for consult:  Facility Placement                Permission sought to share information with:  Facility Sport and exercise psychologist, Family Supports Permission granted to share information::  Yes, Verbal Permission Granted  Name::     [place]::  SNFs  Relationship::     Contact Information:  [phone]  Housing/Transportation Living arrangements for the past 2 months:  Single Family Home Source of Information:  Patient Patient Interpreter Needed:  None Criminal Activity/Legal Involvement Pertinent to Current Situation/Hospitalization:  No - Comment as needed Significant Relationships:  Siblings Lives with:  Self Do you feel safe going back to the place where you live?  No Need for family participation in patient care:  No (Coment)  Care giving concerns:  CSW received consult for possible SNF placement at time of discharge. CSW spoke with patient and his sister at bedside regarding PT recommendation of SNF placement at time of discharge. Patient reported that patient's sister is currently unable to care for patient at their home given patient's current physical needs and fall risk. Patient expressed understanding of PT recommendation and is agreeable to SNF placement at time of discharge. CSW to continue to follow and assist with discharge planning needs.   Social Worker assessment / plan:  CSW spoke with patient concerning possibility of rehab at [facility] before returning home.  Employment status:  Retired Forensic scientist:  Medicare PT Recommendations:  [place] / Referral to community resources:  [place]  Patient/Family's Response to care:  Patient recognizes need for rehab before returning home and is agreeable to a SNF in [place]. Patient reported preference  for [facility] and they are able to accept patient. Patient expressed appreciation.   Patient/Family's Understanding of and Emotional Response to Diagnosis, Current Treatment, and Prognosis:  Patient/family is realistic regarding therapy needs and expressed being hopeful for SNF placement. Patient expressed understanding of CSW role and discharge process as well as medical condition. No questions/concerns about plan or treatment.    Emotional Assessment Appearance:  Appears stated age Attitude/Demeanor/Rapport:  Gracious, Engaged Affect (typically observed):  Accepting, Appropriate, Pleasant Orientation:  Oriented to Self, Oriented to Place, Oriented to  Time, Oriented to Situation Alcohol / Substance use:  Not Applicable Psych involvement (Current and /or in the community):  No (Comment)  Discharge Needs  Concerns to be addressed:  Care Coordination Readmission within the last 30 days:  No Current discharge risk:  None Barriers to Discharge:  No Barriers Identified   Benard Halsted, [place] 03/10/2018, 11:31 AM

## 2018-03-10 NOTE — Progress Notes (Signed)
Subjective: 3 Days Post-Op Procedure(s) (LRB): LEFT TOTAL HIP ARTHROPLASTY ANTERIOR APPROACH (Left) Patient reports pain as mild.  Doing well this am.    Objective: Vital signs in last 24 hours: Temp:  [98.1 F (36.7 C)-98.4 F (36.9 C)] 98.4 F (36.9 C) (09/12 0500) Pulse Rate:  [85-98] 87 (09/12 0500) Resp:  [17-18] 17 (09/12 0500) BP: (114-125)/(50-71) 121/71 (09/12 0500) SpO2:  [94 %-95 %] 94 % (09/12 0500)  Intake/Output from previous day: 09/11 0701 - 09/12 0700 In: 1060 [P.O.:960; I.V.:100] Out: 1650 [Urine:1650] Intake/Output this shift: No intake/output data recorded.  Recent Labs    03/08/18 0440 03/09/18 1036 03/09/18 1838  HGB 8.8* 7.7* 8.6*   Recent Labs    03/08/18 0440 03/09/18 1036 03/09/18 1838  WBC 10.0 18.7*  --   RBC 3.19* 2.74*  --   HCT 29.2* 24.6* 27.9*  PLT 227 279  --    Recent Labs    03/08/18 0440  NA 136  K 5.2*  CL 105  CO2 25  BUN 14  CREATININE 0.73  GLUCOSE 109*  CALCIUM 7.8*   Recent Labs    03/07/18 0853  INR 0.97    Neurologically intact Neurovascular intact Sensation intact distally Intact pulses distally Dorsiflexion/Plantar flexion intact Incision: scant drainage No cellulitis present Compartment soft   Assessment/Plan: 3 Days Post-Op Procedure(s) (LRB): LEFT TOTAL HIP ARTHROPLASTY ANTERIOR APPROACH (Left) Advance diet Up with therapy D/C IV fluids Discharge to SNF once insurance approved and bed available (patient prefers [facility]) TDWB LLE-NO HIP ABDUCTION ABLA- received one unit prbc yesterday.   Dressing changed by me today    Aundra Dubin 03/10/2018, 7:54 AM

## 2018-03-10 NOTE — Progress Notes (Signed)
Patient will DC to: [facility] Anticipated DC date: 03/10/18 Family notified: Sister Transport by: Rodell Perna   Per MD patient ready for DC to [place]. RN, patient, patient's family, and facility notified of DC. Discharge Summary sent to facility. RN given number for report [phone] Room 702). DC packet on chart. Ambulance transport requested for patient.   CSW signing off.  Cedric Fishman, LCSW Clinical Social Worker [phone]

## 2018-03-10 NOTE — Progress Notes (Signed)
Physical Therapy Treatment Patient Details Name: Brian [name] MRN: [id number] DOB: [birth year] Today's Date: 03/10/2018    History of Present Illness 67 y.o. male admitted on 03/07/18 for elective L THA, operative complication of greater trochater fx requiring extra fixation and caging.  Post operatively TDWB and no hip abduction.  Pt with significant PMH of L TKA, R THA, anemia, anxiety, SOB, RA, HTN, gout, CHF, A-flutter (s/p cardioversion).      PT Comments    Pt is limited by left leg pain and soreness today to gait with RW to the bathroom and back to bed.  Exercises reviewed and pt planning to d/c to SNF later today for post acute rehab.  PT to follow acutely until d/c confirmed.     Follow Up Recommendations  SNF;Follow surgeon's recommendation for DC plan and follow-up therapies;Supervision for mobility/OOB     Equipment Recommendations  None recommended by PT    Recommendations for Other Services   NA     Precautions / Restrictions Precautions Precautions: Fall;Other (comment) Precaution Comments: no hip abduction Restrictions Weight Bearing Restrictions: Yes LLE Weight Bearing: Touchdown weight bearing Other Position/Activity Restrictions: No abduction    Mobility  Bed Mobility Overal bed mobility: Needs Assistance Bed Mobility: Supine to Sit;Sit to Supine     Supine to sit: Min assist;HOB elevated Sit to supine: Min assist;HOB elevated   General bed mobility comments: Min assist to help progress left leg to EOB and back into the bed when returning to supine.  Pt relying heavily on UE support on bedrail and HOB elevated for transitions.   Transfers Overall transfer level: Needs assistance Equipment used: Rolling walker (2 wheeled) Transfers: Sit to/from Stand Sit to Stand: Min assist;From elevated surface         General transfer comment: Min assist to support trunk and stabilize RW during transitions.   Ambulation/Gait Ambulation/Gait assistance: Min  assist Gait Distance (Feet): 10 Feet Assistive device: Rolling walker (2 wheeled) Gait Pattern/deviations: Step-to pattern;Antalgic     General Gait Details: Pt with moderately antalgic gait pattern, pain was limiting hallway gait, verbal cues to stay inside of RW especially when going over bathroom threshold to both help with painful WB and to help him maintain TDWB status of his left leg (this was better today, but he also hurt more, so he didn't want to put weight on it).           Balance Overall balance assessment: Needs assistance Sitting-balance support: Feet supported;No upper extremity supported Sitting balance-Leahy Scale: Fair     Standing balance support: No upper extremity supported;Bilateral upper extremity supported;Single extremity supported Standing balance-Leahy Scale: Fair                              Cognition Arousal/Alertness: Awake/alert Behavior During Therapy: WFL for tasks assessed/performed Overall Cognitive Status: Within Functional Limits for tasks assessed                                        Exercises Total Joint Exercises Quad Sets: AROM;Left;10 reps Short Arc Quad: AROM;Left;10 reps Heel Slides: AAROM;Left;10 reps        Pertinent Vitals/Pain Pain Assessment: Faces Faces Pain Scale: Hurts whole lot Pain Location: left leg Pain Descriptors / Indicators: Aching;Burning Pain Intervention(s): Limited activity within patient's tolerance;Monitored during session;Repositioned  PT Goals (current goals can now be found in the care plan section) Acute Rehab PT Goals Patient Stated Goal: to get better, decrease pain, regain independence Progress towards PT goals: Not progressing toward goals - comment(limited by pain)    Frequency    7X/week      PT Plan Current plan remains appropriate       AM-PAC PT "6 Clicks" Daily Activity  Outcome Measure  Difficulty turning over in bed (including  adjusting bedclothes, sheets and blankets)?: Unable Difficulty moving from lying on back to sitting on the side of the bed? : Unable Difficulty sitting down on and standing up from a chair with arms (e.g., wheelchair, bedside commode, etc,.)?: Unable Help needed moving to and from a bed to chair (including a wheelchair)?: A Little Help needed walking in hospital room?: A Little Help needed climbing 3-5 steps with a railing? : A Lot 6 Click Score: 11    End of Session   Activity Tolerance: Patient limited by pain Patient left: in bed;with call bell/phone within reach;with family/visitor present   PT Visit Diagnosis: Muscle weakness (generalized) (M62.81);Difficulty in walking, not elsewhere classified (R26.2);Pain Pain - Right/Left: Left Pain - part of body: Leg     Time: 0923-3007 PT Time Calculation (min) (ACUTE ONLY): 21 min  Charges:  $Gait Training: 8-22 mins          [name] B. Kylia Grajales, PT, DPT  Acute Rehabilitation [phone] pager #[phone] office            03/10/2018, 3:40 PM

## 2018-03-10 NOTE — Plan of Care (Signed)
Patient discharged to rehab via PTAR, all belongings with patient at discharge. Patient understands discharge instructions.

## 2018-03-10 NOTE — Discharge Summary (Signed)
Patient ID: Brian [name] MRN: [id number] DOB/AGE: Jun 29, 1951 67 y.o.  Admit date: 03/07/2018 Discharge date: 03/10/2018  Admission Diagnoses:  Active Problems:   Primary osteoarthritis of left hip   History of hip replacement   Discharge Diagnoses:  Same  Past Medical History:  Diagnosis Date  . Anemia   . Anxiety   . Atrial flutter ([place])    a. 08/2012  . CHF (congestive heart failure) ([place])   . Dysrhythmia    a fib  . GERD (gastroesophageal reflux disease)   . History of gout   . History of kidney stones    passed  . Hypertension   . Obesity   . Pneumonia 09/2016  . Rheumatoid arthritis ([place])    "a little bit qwhere" (07/22/2017)  . Shortness of breath    with exterion from CHF  . Tobacco abuse     Surgeries: Procedure(s): LEFT TOTAL HIP ARTHROPLASTY ANTERIOR APPROACH on 03/07/2018   Consultants:   Discharged Condition: Improved  Hospital Course: BRIAR WITHERSPOON is an 67 y.o. male who was admitted 03/07/2018 for operative treatment of primary localized osteoarthritis left hip. Patient has severe unremitting pain that affects sleep, daily activities, and work/hobbies. After pre-op clearance the patient was taken to the operating room on 03/07/2018 and underwent  Procedure(s): LEFT TOTAL HIP ARTHROPLASTY ANTERIOR APPROACH.    Patient was given perioperative antibiotics:  Anti-infectives (From admission, onward)   Start     Dose/Rate Route Frequency Ordered Stop   03/07/18 2200  vancomycin (VANCOCIN) 1,500 mg in sodium chloride 0.9 % 500 mL IVPB     1,500 mg 250 mL/hr over 120 Minutes Intravenous Every 12 hours 03/07/18 1523 03/08/18 2304   03/07/18 1600  sulfamethoxazole-trimethoprim (BACTRIM DS,SEPTRA DS) 800-160 MG per tablet 1 tablet     1 tablet Oral Every 12 hours 03/07/18 1523     03/07/18 1131  vancomycin (VANCOCIN) powder  Status:  Discontinued       As needed 03/07/18 1132 03/07/18 1339   03/07/18 0915  ceFAZolin (ANCEF) IVPB 2g/100 mL premix     2 g 200  mL/hr over 30 Minutes Intravenous On call to O.R. 03/07/18 0819 03/07/18 1034   03/07/18 0800  vancomycin (VANCOCIN) 1,500 mg in sodium chloride 0.9 % 500 mL IVPB     1,500 mg 250 mL/hr over 120 Minutes Intravenous To Short Stay 03/04/18 0836 03/07/18 1801   03/07/18 0000  sulfamethoxazole-trimethoprim (BACTRIM DS,SEPTRA DS) 800-160 MG tablet     1 tablet Oral 2 times daily 03/07/18 1318         Patient was given sequential compression devices, early ambulation, and chemoprophylaxis to prevent DVT.  Patient benefited maximally from hospital stay and there were no complications.    Recent vital signs:  Patient Vitals for the past 24 hrs:  BP Temp Temp src Pulse Resp SpO2  03/10/18 0500 121/71 98.4 F (36.9 C) Oral 87 17 94 %  03/09/18 2017 (!) 125/52 98.3 F (36.8 C) Oral 86 18 95 %  03/09/18 1630 (!) 114/56 98.3 F (36.8 C) Oral 98 18 95 %  03/09/18 1430 (!) 117/50 98.2 F (36.8 C) Oral 89 18 95 %  03/09/18 1415 (!) 124/59 98.1 F (36.7 C) Oral 85 18 95 %  03/09/18 1300 (!) 124/59 98.1 F (36.7 C) Oral 85 18 95 %     Recent laboratory studies:  Recent Labs    03/07/18 0853  03/08/18 0440 03/09/18 1036 03/09/18 1838  WBC  --   --  10.0 18.7*  --   HGB  --    < > 8.8* 7.7* 8.6*  HCT  --    < > 29.2* 24.6* 27.9*  PLT  --   --  227 279  --   NA  --   --  136  --   --   K  --   --  5.2*  --   --   CL  --   --  105  --   --   CO2  --   --  25  --   --   BUN  --   --  14  --   --   CREATININE  --   --  0.73  --   --   GLUCOSE  --   --  109*  --   --   INR 0.97  --   --   --   --   CALCIUM  --   --  7.8*  --   --    < > = values in this interval not displayed.     Discharge Medications:   Allergies as of 03/10/2018      Reactions   Tape Rash, Other (See Comments)   Paper tape is preferred, please!!      Medication List    STOP taking these medications   cephALEXin 500 MG capsule Commonly known as:  KEFLEX   traMADol 50 MG tablet Commonly known as:  ULTRAM      TAKE these medications   docusate sodium 100 MG capsule Commonly known as:  COLACE Take 200 mg by mouth 2 (two) times daily.   famotidine 20 MG tablet Commonly known as:  PEPCID Take 1 tablet (20 mg total) by mouth at bedtime.   folic acid 1 MG tablet Commonly known as:  FOLVITE Take 1 mg by mouth daily.   furosemide 40 MG tablet Commonly known as:  LASIX Take 1 tablet (40 mg total) by mouth daily. Please call and schedule an appt for further refills, 1st attempt What changed:  when to take this   HUMIRA 40 MG/0.4ML Pskt Generic drug:  Adalimumab Inject 40 mg into the skin every 14 (fourteen) days.   methocarbamol 750 MG tablet Commonly known as:  ROBAXIN Take 1 tablet (750 mg total) by mouth 2 (two) times daily as needed for muscle spasms.   methotrexate 2.5 MG tablet Commonly known as:  RHEUMATREX Take 20 mg by mouth every Friday. In the morning.   metoprolol succinate 25 MG 24 hr tablet Commonly known as:  TOPROL-XL Take 1 tablet (25 mg total) 2 (two) times daily by mouth.   mupirocin ointment 2 % Commonly known as:  BACTROBAN Apply to affected area 3 times daily 5 days pre-op What changed:  Another medication with the same name was added. Make sure you understand how and when to take each.   mupirocin ointment 2 % Commonly known as:  BACTROBAN Apply 1 application topically 2 (two) times daily. What changed:  You were already taking a medication with the same name, and this prescription was added. Make sure you understand how and when to take each.   ondansetron 4 MG tablet Commonly known as:  ZOFRAN Take 1-2 tablets (4-8 mg total) by mouth every 8 (eight) hours as needed for nausea or vomiting.   oxyCODONE 5 MG immediate release tablet Commonly known as:  Oxy IR/ROXICODONE Take 1-3 tablets (5-15 mg total) by mouth every 4 (four)  hours as needed. What changed:    how much to take  how to take this  when to take this  reasons to take  this  additional instructions   oxyCODONE 10 mg 12 hr tablet Commonly known as:  OXYCONTIN Take 1 tablet (10 mg total) by mouth every 12 (twelve) hours for 3 days. What changed:  You were already taking a medication with the same name, and this prescription was added. Make sure you understand how and when to take each.   potassium chloride SA 20 MEQ tablet Commonly known as:  K-DUR,KLOR-CON Take 1 tablet (20 mEq total) by mouth daily.   promethazine 25 MG tablet Commonly known as:  PHENERGAN Take 1 tablet (25 mg total) by mouth every 6 (six) hours as needed for nausea.   ranitidine 150 MG tablet Commonly known as:  ZANTAC Take 150 mg by mouth daily.   senna-docusate 8.6-50 MG tablet Commonly known as:  Senokot-S Take 1 tablet by mouth at bedtime as needed.   sulfamethoxazole-trimethoprim 800-160 MG tablet Commonly known as:  BACTRIM DS,SEPTRA DS Take 1 tablet by mouth 2 (two) times daily.   XARELTO 20 MG Tabs tablet Generic drug:  rivaroxaban TAKE 1 TABLET (20 MG TOTAL) BY MOUTH DAILY WITH SUPPER. What changed:  Another medication with the same name was removed. Continue taking this medication, and follow the directions you see here.            Durable Medical Equipment  (From admission, onward)         Start     Ordered   03/07/18 1523  DME Walker rolling  Once    Question:  Patient needs a walker to treat with the following condition  Answer:  History of hip replacement   03/07/18 1523   03/07/18 1523  DME 3 n 1  Once     03/07/18 1523   03/07/18 1523  DME Bedside commode  Once    Question:  Patient needs a bedside commode to treat with the following condition  Answer:  History of hip replacement   03/07/18 1523          Diagnostic Studies: Dg Pelvis Portable  Result Date: 03/07/2018 CLINICAL DATA:  Status post left hip arthroplasty EXAM: PORTABLE PELVIS 1-2 VIEWS COMPARISON:  03/07/2018, 02/15/2018, 01/05/2019 FINDINGS: Previous right hip replacement with  normal alignment. Pubic symphysis and rami are intact. Interval left hip replacement with single cerclage wire at the greater trochanter. No definitive acute displaced fracture. Normal alignment of the hardware. Gas in the soft tissues. IMPRESSION: Interval left hip replacement with normal alignment. Gas in the soft tissues consistent with recent operative status. Electronically Signed   By: Donavan Foil M.D.   On: 03/07/2018 14:33   Dg C-arm 1-60 Min  Result Date: 03/07/2018 CLINICAL DATA:  Left hip replacement EXAM: DG C-ARM 61-120 MIN; OPERATIVE LEFT HIP WITH PELVIS COMPARISON:  None. FINDINGS: Changes of left hip replacement noted. Normal AP alignment. No visible hardware or bony complicating feature. IMPRESSION: Left hip replacement.  No visible complicating feature. Electronically Signed   By: Rolm Baptise M.D.   On: 03/07/2018 13:11   Dg C-arm 1-60 Min  Result Date: 03/07/2018 CLINICAL DATA:  Left hip replacement EXAM: DG C-ARM 61-120 MIN; OPERATIVE LEFT HIP WITH PELVIS COMPARISON:  None. FINDINGS: Changes of left hip replacement noted. Normal AP alignment. No visible hardware or bony complicating feature. IMPRESSION: Left hip replacement.  No visible complicating feature. Electronically Signed   By:  Rolm Baptise M.D.   On: 03/07/2018 13:11   Dg Hip Operative Unilat W Or W/o Pelvis Left  Result Date: 03/07/2018 CLINICAL DATA:  Left hip replacement EXAM: DG C-ARM 61-120 MIN; OPERATIVE LEFT HIP WITH PELVIS COMPARISON:  None. FINDINGS: Changes of left hip replacement noted. Normal AP alignment. No visible hardware or bony complicating feature. IMPRESSION: Left hip replacement.  No visible complicating feature. Electronically Signed   By: Rolm Baptise M.D.   On: 03/07/2018 13:11   Xr Hip Unilat W Or W/o Pelvis 2-3 Views Right  Result Date: 02/15/2018 Stable right total hip replacement.  Advanced degenerative joint disease left hip.  Bone-on-bone joint space narrowing   Disposition: Discharge  disposition: 03-Skilled [place]    Leandrew Koyanagi, MD In 1 week.   Specialty:  Orthopedic Surgery Why:  For wound re-check Contact information: [place] [address] [phone]            Signed: Aundra Dubin 03/10/2018, 7:56 AM

## 2018-03-10 NOTE — Progress Notes (Signed)
Report given to Suzzanne Cloud, RN at [facility].  Written instructions will be sent per [facility].

## 2018-03-11 DIAGNOSIS — D649 Anemia, unspecified: Secondary | ICD-10-CM | POA: Diagnosis not present

## 2018-03-11 DIAGNOSIS — Z96642 Presence of left artificial hip joint: Secondary | ICD-10-CM | POA: Diagnosis not present

## 2018-03-11 DIAGNOSIS — M069 Rheumatoid arthritis, unspecified: Secondary | ICD-10-CM | POA: Diagnosis not present

## 2018-03-11 DIAGNOSIS — I4891 Unspecified atrial fibrillation: Secondary | ICD-10-CM | POA: Diagnosis not present

## 2018-03-14 DIAGNOSIS — M069 Rheumatoid arthritis, unspecified: Secondary | ICD-10-CM | POA: Diagnosis not present

## 2018-03-14 DIAGNOSIS — L03818 Cellulitis of other sites: Secondary | ICD-10-CM | POA: Diagnosis not present

## 2018-03-14 DIAGNOSIS — D649 Anemia, unspecified: Secondary | ICD-10-CM | POA: Diagnosis not present

## 2018-03-14 DIAGNOSIS — Z96642 Presence of left artificial hip joint: Secondary | ICD-10-CM | POA: Diagnosis not present

## 2018-03-16 DIAGNOSIS — Z96642 Presence of left artificial hip joint: Secondary | ICD-10-CM | POA: Diagnosis not present

## 2018-03-16 DIAGNOSIS — L03818 Cellulitis of other sites: Secondary | ICD-10-CM | POA: Diagnosis not present

## 2018-03-17 DIAGNOSIS — R2689 Other abnormalities of gait and mobility: Secondary | ICD-10-CM | POA: Diagnosis not present

## 2018-03-17 DIAGNOSIS — L03818 Cellulitis of other sites: Secondary | ICD-10-CM | POA: Diagnosis not present

## 2018-03-17 DIAGNOSIS — Z96642 Presence of left artificial hip joint: Secondary | ICD-10-CM | POA: Diagnosis not present

## 2018-03-17 DIAGNOSIS — K5900 Constipation, unspecified: Secondary | ICD-10-CM | POA: Diagnosis not present

## 2018-03-17 DIAGNOSIS — M25552 Pain in left hip: Secondary | ICD-10-CM | POA: Diagnosis not present

## 2018-03-18 ENCOUNTER — Other Ambulatory Visit: Payer: Self-pay

## 2018-03-18 ENCOUNTER — Emergency Department (HOSPITAL_COMMUNITY)
Admission: EM | Admit: 2018-03-18 | Discharge: 2018-03-18 | Disposition: A | Discharge status: Rehabilitation Facility | Payer: Medicare Other | Attending: Emergency Medicine | Admitting: Emergency Medicine

## 2018-03-18 ENCOUNTER — Encounter (HOSPITAL_COMMUNITY): Payer: Self-pay

## 2018-03-18 ENCOUNTER — Emergency Department (HOSPITAL_BASED_OUTPATIENT_CLINIC_OR_DEPARTMENT_OTHER): Payer: Medicare Other

## 2018-03-18 ENCOUNTER — Emergency Department (HOSPITAL_COMMUNITY): Payer: Medicare Other

## 2018-03-18 DIAGNOSIS — D72828 Other elevated white blood cell count: Secondary | ICD-10-CM | POA: Insufficient documentation

## 2018-03-18 DIAGNOSIS — R609 Edema, unspecified: Secondary | ICD-10-CM

## 2018-03-18 DIAGNOSIS — Z9889 Other specified postprocedural states: Secondary | ICD-10-CM | POA: Insufficient documentation

## 2018-03-18 DIAGNOSIS — Z471 Aftercare following joint replacement surgery: Secondary | ICD-10-CM | POA: Diagnosis not present

## 2018-03-18 DIAGNOSIS — Z96643 Presence of artificial hip joint, bilateral: Secondary | ICD-10-CM | POA: Diagnosis not present

## 2018-03-18 DIAGNOSIS — M79609 Pain in unspecified limb: Secondary | ICD-10-CM | POA: Diagnosis not present

## 2018-03-18 DIAGNOSIS — R0602 Shortness of breath: Secondary | ICD-10-CM | POA: Diagnosis not present

## 2018-03-18 DIAGNOSIS — Z96642 Presence of left artificial hip joint: Secondary | ICD-10-CM | POA: Insufficient documentation

## 2018-03-18 DIAGNOSIS — L03818 Cellulitis of other sites: Secondary | ICD-10-CM | POA: Diagnosis not present

## 2018-03-18 LAB — URINALYSIS, ROUTINE W REFLEX MICROSCOPIC
BILIRUBIN URINE: NEGATIVE
Glucose, UA: NEGATIVE mg/dL
Hgb urine dipstick: NEGATIVE
KETONES UR: NEGATIVE mg/dL
Leukocytes, UA: NEGATIVE
NITRITE: NEGATIVE
Protein, ur: NEGATIVE mg/dL
Specific Gravity, Urine: 1.013 (ref 1.005–1.030)
pH: 5 (ref 5.0–8.0)

## 2018-03-18 LAB — CBC WITH DIFFERENTIAL/PLATELET
BASOS PCT: 1 %
Basophils Absolute: 0.1 10*3/uL (ref 0.0–0.1)
Eosinophils Absolute: 0.6 10*3/uL (ref 0.0–0.7)
Eosinophils Relative: 4 %
HEMATOCRIT: 28.2 % — AB (ref 39.0–52.0)
HEMOGLOBIN: 8.6 g/dL — AB (ref 13.0–17.0)
Lymphocytes Relative: 14 %
Lymphs Abs: 2.1 10*3/uL (ref 0.7–4.0)
MCH: 27 pg (ref 26.0–34.0)
MCHC: 30.5 g/dL (ref 30.0–36.0)
MCV: 88.7 fL (ref 78.0–100.0)
MONO ABS: 1.2 10*3/uL (ref 0.1–1.0)
MONOS PCT: 8 %
Neutro Abs: 10.5 10*3/uL (ref 1.7–7.7)
Neutrophils Relative %: 73 %
PLATELETS: 537 10*3/uL — AB (ref 150–400)
RBC: 3.18 MIL/uL — ABNORMAL LOW (ref 4.22–5.81)
RDW: 16.2 % — ABNORMAL HIGH (ref 11.5–15.5)
WBC: 14.4 10*3/uL — AB (ref 4.0–10.5)

## 2018-03-18 LAB — BASIC METABOLIC PANEL
ANION GAP: 9 (ref 5–15)
BUN: 11 mg/dL (ref 8–23)
CHLORIDE: 100 mmol/L (ref 98–111)
CO2: 28 mmol/L (ref 22–32)
Calcium: 8.4 mg/dL — ABNORMAL LOW (ref 8.9–10.3)
Creatinine, Ser: 0.73 mg/dL (ref 0.61–1.24)
GFR calc Af Amer: >60 mL/min (ref >60)
Glucose, Bld: 113 mg/dL — ABNORMAL HIGH (ref 70–99)
POTASSIUM: 4.8 mmol/L (ref 3.5–5.1)
Sodium: 137 mmol/L (ref 135–145)

## 2018-03-18 LAB — SEDIMENTATION RATE: Sed Rate: 84 mm/hr — ABNORMAL HIGH (ref 0–16)

## 2018-03-18 LAB — C-REACTIVE PROTEIN: CRP: 7.4 mg/dL — ABNORMAL HIGH (ref <1.0)

## 2018-03-18 LAB — I-STAT CG4 LACTIC ACID, ED: Lactic Acid, Venous: 1.42 mmol/L (ref 0.5–1.9)

## 2018-03-18 MED ORDER — HYDROMORPHONE HCL 1 MG/ML IJ SOLN
0.5000 mg | Freq: Once | INTRAMUSCULAR | Status: AC
  Administered 2018-03-18: 0.5 mg via INTRAVENOUS
  Filled 2018-03-18: qty 1

## 2018-03-18 MED ORDER — HYDROMORPHONE HCL 1 MG/ML IJ SOLN: 1.0000 mg | Freq: Once | INTRAMUSCULAR | Status: DC

## 2018-03-18 NOTE — ED Notes (Signed)
Pt given turkey sandwich and sprite zero 

## 2018-03-18 NOTE — ED Notes (Signed)
Bed: ZX80 Expected date:  Expected time:  Means of arrival:  Comments: EMS-infection

## 2018-03-18 NOTE — Discharge Instructions (Addendum)
For your wound:  Apply a sterile, clean dressing twice daily. Wash with warm antibacterial soap.

## 2018-03-18 NOTE — ED Notes (Signed)
Bed: [facility] Expected date:  Expected time:  Means of arrival:  Comments: Hold for 22

## 2018-03-18 NOTE — ED Notes (Signed)
Surgeon at bedside, replaced dressing on pt.  RN reviewed pt d/c instructions and pt told he would be going back by [facility]

## 2018-03-18 NOTE — Progress Notes (Signed)
Preliminary notes--Left lower extremity venous duplex exam completed. 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 Limited on peroneal veins due to poor visibility on calf, cannot completely exclude the possible thrombosis at peroneal veins. Result called ED Dr. Myrene Buddy. Brian [name] (RDMS RVT) 03/18/18 2:29 PM

## 2018-03-18 NOTE — ED Provider Notes (Signed)
[facility] DEPT Provider Note   CSN: [id number] Arrival date & time: 03/18/18  1024     History   Chief Complaint Chief Complaint  Patient presents with  . Post-op Problem  . Wound Infection    HPI Brian [name] is a 67 y.o. male.  HPI 67 year old male with history of a flutter, shortness of breath, previous left hip osteoarthritis status post recent replacement on Keflex and Bactrim here with increasing white count.  Patient states that since his surgery, he has had persistent, now worsening left hip pain.  Describes pain as an ache, throbbing sensation.  Said associated warmth and swelling of the leg, which has not improved and has worsened over the last 24 hours.  Has been having lab work done at his skilled nursing facility, which showed an increase in his white blood cell count from 11,000-19,000 over the last week.  Patient has had subjective fevers and chills.  Denies any nausea or vomiting.  No diarrhea.  His temperature has been up to 100.6 orally.  He has been taking his antibiotics as prescribed.  Denies any new falls.  He has a surgical dressing in place.  No alleviating factors.  Past Medical History:  Diagnosis Date  . Anemia   . Anxiety   . Atrial flutter ([place])    a. 08/2012  . CHF (congestive heart failure) ([place])   . Dysrhythmia    a fib  . GERD (gastroesophageal reflux disease)   . History of gout   . History of kidney stones    passed  . Hypertension   . Obesity   . Pneumonia 09/2016  . Rheumatoid arthritis ([place])    "a little bit qwhere" (07/22/2017)  . Shortness of breath    with exterion from CHF  . Tobacco abuse     Patient Active Problem List   Diagnosis Date Noted  . Primary osteoarthritis of left hip 03/07/2018  . History of hip replacement 03/07/2018  . 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 Cellulitis 01/22/2017  . Polyarthritis 12/01/2016  .  Cellulitis of left leg 11/29/2016  . Sepsis ([place]) 11/29/2016  . Effusion, left knee 11/13/2016  . Persistent atrial fibrillation ([place])   . Dyspnea   . Positive urine drug screen   . CAP (community acquired pneumonia) 09/30/2016  . Community acquired pneumonia of left lower lobe of lung ([place])   . Pleural effusion   . Tachypnea   . CVA (cerebral infarction) 09/27/2015  . Right hand pain 09/27/2015  . History of gout 09/27/2015  . Cocaine abuse ([place]) 09/27/2015  . TIA (transient ischemic attack) 09/27/2015  . Slurred speech   . Primary osteoarthritis of right hand   . Leukocytosis   . Essential hypertension   . Chronic diastolic CHF (congestive heart failure) ([place])   . HTN (hypertension) 11/22/2013  . Chronic diastolic heart failure ([place]) 11/22/2013  . Noncompliance 11/17/2013  . Atrial flutter with rapid ventricular response ([place]) 11/16/2013  . Acute on chronic diastolic CHF (congestive heart failure) ([place]) 09/05/2012  . Tobacco abuse   . Obesity- BMI 35   . Diastolic CHF ([place])   . Atrial flutter ([place])   . Anxiety     Past Surgical History:  Procedure Laterality Date  . A-FLUTTER ABLATION N/A 12/29/2016   Procedure: A-Flutter Ablation;  Surgeon: [name], MD;  Location: [facility];  Service: Cardiovascular;  Laterality: N/A;  .  CARDIOVERSION N/A 11/05/2016   Procedure: CARDIOVERSION;  Surgeon: Sueanne Margarita, MD;  Location: [facility];  Service: Cardiovascular;  Laterality: N/A;  . IR THORACENTESIS ASP PLEURAL SPACE W/IMG GUIDE  10/01/2016  . JOINT REPLACEMENT     Right hip and left knee  . MICROLARYNGOSCOPY  09/02/2007   with excision of right vocal cord mass, Dr. Constance Holster  . TOTAL HIP ARTHROPLASTY Right 11/17/2017  . TOTAL HIP ARTHROPLASTY Right 11/17/2017   Procedure: RIGHT TOTAL HIP ARTHROPLASTY ANTERIOR APPROACH;  Surgeon: Leandrew Koyanagi, MD;  Location: [facility];  Service: Orthopedics;  Laterality: Right;  . TOTAL HIP ARTHROPLASTY Left 03/07/2018   Procedure: LEFT  TOTAL HIP ARTHROPLASTY ANTERIOR APPROACH;  Surgeon: Leandrew Koyanagi, MD;  Location: [facility];  Service: Orthopedics;  Laterality: Left;  . TOTAL KNEE ARTHROPLASTY Left 07/22/2017  . TOTAL KNEE ARTHROPLASTY Left 07/22/2017   Procedure: LEFT TOTAL KNEE ARTHROPLASTY;  Surgeon: Leandrew Koyanagi, MD;  Location: [facility];  Service: Orthopedics;  Laterality: Left;        Home Medications    Prior to Admission medications   Medication Sig Start Date End Date Taking? Authorizing Provider  acetaminophen (TYLENOL) 500 MG tablet Take 1,000 mg by mouth 3 (three) times daily as needed (Pain).   Yes [provider]  cephALEXin (KEFLEX) 500 MG capsule Take 500 mg by mouth every 6 (six) hours.   Yes [provider]  docusate sodium (COLACE) 100 MG capsule Take 200 mg by mouth 2 (two) times daily.    Yes [provider]  famotidine (PEPCID) 20 MG tablet Take 1 tablet (20 mg total) by mouth at bedtime. 07/25/17  Yes Jessy Oto, MD  folic acid (FOLVITE) 1 MG tablet Take 1 mg by mouth daily. 04/15/17  Yes [provider]  furosemide (LASIX) 40 MG tablet Take 1 tablet (40 mg total) by mouth daily. Please call and schedule an appt for further refills, 1st attempt Patient taking differently: Take 40 mg by mouth 2 (two) times daily. Please call and schedule an appt for further refills, 1st attempt 11/18/17  Yes Jerline Pain, MD  Lidocaine (ASPERCREME LIDOCAINE) 4 % PTCH Apply 2 patches topically daily. Apply 2 patches on left hip daily. Remove after 12 hours.   Yes [provider]  methocarbamol (ROBAXIN) 750 MG tablet Take 1 tablet (750 mg total) by mouth 2 (two) times daily as needed for muscle spasms. 03/07/18  Yes Leandrew Koyanagi, MD  methotrexate (RHEUMATREX) 2.5 MG tablet Take 20 mg by mouth every Friday. In the morning. 04/26/17  Yes [provider]  metoprolol succinate (TOPROL XL) 25 MG 24 hr tablet Take 1 tablet (25 mg total) 2 (two) times daily by mouth. 05/11/17   Yes Skains, Thana Farr, MD  mupirocin ointment (BACTROBAN) 2 % Apply 1 application topically 2 (two) times daily. 03/07/18  Yes Leandrew Koyanagi, MD  ondansetron (ZOFRAN) 4 MG tablet Take 1-2 tablets (4-8 mg total) by mouth every 8 (eight) hours as needed for nausea or vomiting. 03/07/18  Yes Leandrew Koyanagi, MD  oxyCODONE (OXY IR/ROXICODONE) 5 MG immediate release tablet Take 1-3 tablets (5-15 mg total) by mouth every 4 (four) hours as needed. Patient taking differently: Take 5-15 mg by mouth every 4 (four) hours as needed (Pain).  03/07/18  Yes Leandrew Koyanagi, MD  potassium chloride SA (K-DUR,KLOR-CON) 20 MEQ tablet Take 1 tablet (20 mEq total) by mouth daily. 10/26/16  Yes Jerline Pain, MD  promethazine (  PHENERGAN) 25 MG tablet Take 1 tablet (25 mg total) by mouth every 6 (six) hours as needed for nausea. 03/07/18  Yes Leandrew Koyanagi, MD  ranitidine (ZANTAC) 150 MG tablet Take 150 mg by mouth daily.   Yes [provider]  senna-docusate (SENOKOT S) 8.6-50 MG tablet Take 1 tablet by mouth at bedtime as needed. Patient taking differently: Take 1 tablet by mouth at bedtime as needed (Constipation).  03/07/18  Yes Leandrew Koyanagi, MD  sodium phosphate (FLEET) 7-19 GM/118ML ENEM Place 1 enema rectally daily as needed for severe constipation.   Yes [provider]  sulfamethoxazole-trimethoprim (BACTRIM DS,SEPTRA DS) 800-160 MG tablet Take 1 tablet by mouth 2 (two) times daily. 03/07/18  Yes Xu, Marylynn Pearson, MD  XARELTO 20 MG TABS tablet TAKE 1 TABLET (20 MG TOTAL) BY MOUTH DAILY WITH SUPPER. 02/23/18  Yes Allred, Jeneen Rinks, MD  mupirocin ointment (BACTROBAN) 2 % Apply to affected area 3 times daily 5 days pre-op Patient not taking: Reported on 03/18/2018 11/08/17 11/08/18  Aundra Dubin, PA-C    Family History Family History  Problem Relation Age of Onset  . Cancer Mother   . Heart disease Father   . Heart attack Father   . Cancer Father   . Diabetes Sister     Social History Social History   Tobacco  Use  . Smoking status: Current Every Day Smoker    Packs/day: 0.13    Years: 45.00    Pack years: 5.85    Types: Cigarettes  . Smokeless tobacco: Never Used  . Tobacco comment: "quit off and on"  Substance Use Topics  . Alcohol use: Yes    Comment: 07/22/2017 "nothing since 2016"  . Drug use: No    Comment: 07/22/2017 "none since 11/2016" cocaine (sister is not aware)     Allergies   Tape   Review of Systems Review of Systems  Constitutional: Positive for chills, fatigue and fever.  HENT: Negative for congestion and rhinorrhea.   Eyes: Negative for visual disturbance.  Respiratory: Negative for cough, shortness of breath and wheezing.   Cardiovascular: Negative for chest pain and leg swelling.  Gastrointestinal: Negative for abdominal pain, diarrhea, nausea and vomiting.  Genitourinary: Negative for dysuria and flank pain.  Musculoskeletal: Positive for arthralgias and gait problem. Negative for neck pain and neck stiffness.  Skin: Positive for rash and wound.  Allergic/Immunologic: Negative for immunocompromised state.  Neurological: Positive for weakness. Negative for syncope and headaches.  All other systems reviewed and are negative.    Physical Exam Updated Vital Signs BP 121/72   Pulse 71   Temp 97.6 F (36.4 C) (Oral)   Resp 16   Ht 5\' 10"  (1.778 m)   Wt 107 kg   SpO2 98%   BMI 33.86 kg/m   Physical Exam  Constitutional: He is oriented to person, place, and time. He appears well-developed and well-nourished. No distress.  HENT:  Head: Normocephalic and atraumatic.  Eyes: Conjunctivae are normal.  Neck: Neck supple.  Cardiovascular: Normal rate, regular rhythm and normal heart sounds. Exam reveals no friction rub.  No murmur heard. Pulmonary/Chest: Effort normal and breath sounds normal. No respiratory distress. He has no wheezes. He has no rales.  Abdominal: He exhibits no distension.  Musculoskeletal: He exhibits no edema.  Neurological: He is alert  and oriented to person, place, and time. He exhibits normal muscle tone.  Skin: Skin is warm. Capillary refill takes less than 2 seconds.  Psychiatric: He has  a normal mood and affect.  Nursing note and vitals reviewed.   LOWER EXTREMITY EXAM: LEFT  INSPECTION & PALPATION: Moderate erythema and warmth over the left anterior lateral thigh and hip.  Surgical dressing in place.  No overt fluctuance or drainage.  Exquisite tenderness with any passive range of motion. Knee surgical site c/d/i.  SENSORY: sensation is intact to light touch in:  Superficial peroneal nerve distribution (over dorsum of foot) Deep peroneal nerve distribution (over first dorsal web space) Sural nerve distribution (over lateral aspect 5th metatarsal) Saphenous nerve distribution (over medial instep)  MOTOR:  + Motor EHL (great toe dorsiflexion) + FHL (great toe plantar flexion)  + TA (ankle dorsiflexion)  + GSC (ankle plantar flexion)  VASCULAR: 2+ dorsalis pedis and posterior tibialis pulses Capillary refill < 2 sec, toes warm and well-perfused  COMPARTMENTS: Soft, warm, well-perfused No pain with passive extension No parethesias    ED Treatments / Results  Labs (all labs ordered are listed, but only abnormal results are displayed) Labs Reviewed  CBC WITH DIFFERENTIAL/PLATELET - Abnormal; Notable for the following components:      Result Value   WBC 14.4 (*)    RBC 3.18 (*)    Hemoglobin 8.6 (*)    HCT 28.2 (*)    RDW 16.2 (*)    Platelets 537 (*)    All other components within normal limits  BASIC METABOLIC PANEL - Abnormal; Notable for the following components:   Glucose, Bld 113 (*)    Calcium 8.4 (*)    All other components within normal limits  SEDIMENTATION RATE - Abnormal; Notable for the following components:   Sed Rate 84 (*)    All other components within normal limits  C-REACTIVE PROTEIN - Abnormal; Notable for the following components:   CRP 7.4 (*)    All other components  within normal limits  URINALYSIS, ROUTINE W REFLEX MICROSCOPIC  I-STAT CG4 LACTIC ACID, ED    EKG None  Radiology No results found.  Procedures Procedures (including critical care time)  Medications Ordered in ED Medications  HYDROmorphone (DILAUDID) injection 0.5 mg (0.5 mg Intravenous Given 03/18/18 1333)     Initial Impression / Assessment and Plan / ED Course  I have reviewed the triage vital signs and the nursing notes.  Pertinent labs & imaging results that were available during my care of the patient were reviewed by me and considered in my medical decision making (see chart for details).    67 year old male status post recent left hip replacement here with increasing white count at facility.  White count reportedly 19,000 facility, though down to 14.4 here.  He does have a moderate left shift.  Patient is status post recent hip replacement and I discussed the case, labs, and reviewed photograph with Dr. Erlinda Hong, who doubts infection.  Current plan is to obtain chest x-ray, plain films of the hip.  If negative, Dr. Sherrian Divers is comfortable with discharge home with likely ongoing post-op inflammation, w/o acute infection. Continue current ABX.   Of note, DVT study shows ? Popliteal/peroneal DVT. He is already on Xarelto. No signs of PE. Doubt this is contributing.  Patient care transferred to Dr. Lita Mains at the end of my shift. Patient presentation, ED course, and plan of care discussed with review of all pertinent labs and imaging. Please see his/her note for further details regarding further ED course and disposition.    Final Clinical Impressions(s) / ED Diagnoses   Final diagnoses:  Other elevated white blood  cell (WBC) count  Status post left hip replacement    ED Discharge Orders    None       Duffy Bruce, MD 03/18/18 [phone]

## 2018-03-18 NOTE — ED Notes (Signed)
Attempted report to [facility], no answer.

## 2018-03-21 DIAGNOSIS — L03818 Cellulitis of other sites: Secondary | ICD-10-CM | POA: Diagnosis not present

## 2018-03-21 DIAGNOSIS — K5900 Constipation, unspecified: Secondary | ICD-10-CM | POA: Diagnosis not present

## 2018-03-21 DIAGNOSIS — R2689 Other abnormalities of gait and mobility: Secondary | ICD-10-CM | POA: Diagnosis not present

## 2018-03-21 DIAGNOSIS — M25552 Pain in left hip: Secondary | ICD-10-CM | POA: Diagnosis not present

## 2018-03-21 DIAGNOSIS — Z96652 Presence of left artificial knee joint: Secondary | ICD-10-CM | POA: Diagnosis not present

## 2018-03-22 ENCOUNTER — Ambulatory Visit (INDEPENDENT_AMBULATORY_CARE_PROVIDER_SITE_OTHER): Payer: Medicare Other | Admitting: Orthopaedic Surgery

## 2018-03-22 ENCOUNTER — Encounter (INDEPENDENT_AMBULATORY_CARE_PROVIDER_SITE_OTHER): Payer: Self-pay | Admitting: Orthopaedic Surgery

## 2018-03-22 DIAGNOSIS — M1612 Unilateral primary osteoarthritis, left hip: Secondary | ICD-10-CM

## 2018-03-22 NOTE — Progress Notes (Signed)
Post-Op Visit Note   Patient: Brian [name]           Date of Birth: 12/01/1950           MRN: [id number] Visit Date: 03/22/2018 PCP: Orpah Melter, MD   Assessment & Plan:  Chief Complaint:  Chief Complaint  Patient presents with  . Left Hip - Routine Post Op   Visit Diagnoses:  1. Primary osteoarthritis of left hip     Plan: Brian [name] is two-week status post left total hip replacement.  He is currently at [facility].  He did well except for his normal base line of pain.  Denies any constitutional symptoms.  His incision is healed without any drainage or evidence of infection.  He has been progressing with PT.  We will go ahead and remove the sutures today and placed Steri-Strips.  We will advance him to 25% partial weightbearing to the left lower extremity.  I would like to recheck him in 2 weeks with 2 view x-rays of the left hip.  Follow-Up Instructions: Return in about 2 weeks (around 04/05/2018).   Orders:  No orders of the defined types were placed in this encounter.  No orders of the defined types were placed in this encounter.   Imaging: No results found.  PMFS History: Patient Active Problem List   Diagnosis Date Noted  . Primary osteoarthritis of left hip 03/07/2018  . History of hip replacement 03/07/2018  . 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 Cellulitis 01/22/2017  . Polyarthritis 12/01/2016  . Cellulitis of left leg 11/29/2016  . Sepsis ([place]) 11/29/2016  . Effusion, left knee 11/13/2016  . Persistent atrial fibrillation ([place])   . Dyspnea   . Positive urine drug screen   . CAP (community acquired pneumonia) 09/30/2016  . Community acquired pneumonia of left lower lobe of lung ([place])   . Pleural effusion   . Tachypnea   . CVA (cerebral infarction) 09/27/2015  . Right hand pain 09/27/2015  . History of gout 09/27/2015  . Cocaine abuse ([place]) 09/27/2015  . TIA (transient ischemic  attack) 09/27/2015  . Slurred speech   . Primary osteoarthritis of right hand   . Leukocytosis   . Essential hypertension   . Chronic diastolic CHF (congestive heart failure) ([place])   . HTN (hypertension) 11/22/2013  . Chronic diastolic heart failure ([place]) 11/22/2013  . Noncompliance 11/17/2013  . Atrial flutter with rapid ventricular response ([place]) 11/16/2013  . Acute on chronic diastolic CHF (congestive heart failure) ([place]) 09/05/2012  . Tobacco abuse   . Obesity- BMI 35   . Diastolic CHF ([place])   . Atrial flutter ([place])   . Anxiety    Past Medical History:  Diagnosis Date  . Anemia   . Anxiety   . Atrial flutter ([place])    a. 08/2012  . CHF (congestive heart failure) ([place])   . Dysrhythmia    a fib  . GERD (gastroesophageal reflux disease)   . History of gout   . History of kidney stones    passed  . Hypertension   . Obesity   . Pneumonia 09/2016  . Rheumatoid arthritis ([place])    "a little bit qwhere" (07/22/2017)  . Shortness of breath    with exterion from CHF  . Tobacco abuse     Family History  Problem Relation Age of Onset  . Cancer Mother   . Heart disease Father   .  Heart attack Father   . Cancer Father   . Diabetes Sister     Past Surgical History:  Procedure Laterality Date  . A-FLUTTER ABLATION N/A 12/29/2016   Procedure: A-Flutter Ablation;  Surgeon: [name], MD;  Location: [facility];  Service: Cardiovascular;  Laterality: N/A;  . CARDIOVERSION N/A 11/05/2016   Procedure: CARDIOVERSION;  Surgeon: Sueanne Margarita, MD;  Location: [facility];  Service: Cardiovascular;  Laterality: N/A;  . IR THORACENTESIS ASP PLEURAL SPACE W/IMG GUIDE  10/01/2016  . JOINT REPLACEMENT     Right hip and left knee  . MICROLARYNGOSCOPY  09/02/2007   with excision of right vocal cord mass, Dr. Constance Holster  . TOTAL HIP ARTHROPLASTY Right 11/17/2017  . TOTAL HIP ARTHROPLASTY Right 11/17/2017   Procedure: RIGHT TOTAL HIP ARTHROPLASTY ANTERIOR APPROACH;  Surgeon: Leandrew Koyanagi,  MD;  Location: [facility];  Service: Orthopedics;  Laterality: Right;  . TOTAL HIP ARTHROPLASTY Left 03/07/2018   Procedure: LEFT TOTAL HIP ARTHROPLASTY ANTERIOR APPROACH;  Surgeon: Leandrew Koyanagi, MD;  Location: [facility];  Service: Orthopedics;  Laterality: Left;  . TOTAL KNEE ARTHROPLASTY Left 07/22/2017  . TOTAL KNEE ARTHROPLASTY Left 07/22/2017   Procedure: LEFT TOTAL KNEE ARTHROPLASTY;  Surgeon: Leandrew Koyanagi, MD;  Location: [facility];  Service: Orthopedics;  Laterality: Left;   Social History   Occupational History  . Not on file  Tobacco Use  . Smoking status: Current Every Day Smoker    Packs/day: 0.13    Years: 45.00    Pack years: 5.85    Types: Cigarettes  . Smokeless tobacco: Never Used  . Tobacco comment: "quit off and on"  Substance and Sexual Activity  . Alcohol use: Yes    Comment: 07/22/2017 "nothing since 2016"  . Drug use: No    Comment: 07/22/2017 "none since 11/2016" cocaine (sister is not aware)  . Sexual activity: Not Currently

## 2018-03-23 DIAGNOSIS — L03818 Cellulitis of other sites: Secondary | ICD-10-CM | POA: Diagnosis not present

## 2018-03-23 DIAGNOSIS — M25552 Pain in left hip: Secondary | ICD-10-CM | POA: Diagnosis not present

## 2018-03-23 DIAGNOSIS — R2689 Other abnormalities of gait and mobility: Secondary | ICD-10-CM | POA: Diagnosis not present

## 2018-03-23 DIAGNOSIS — D649 Anemia, unspecified: Secondary | ICD-10-CM | POA: Diagnosis not present

## 2018-03-24 DIAGNOSIS — R300 Dysuria: Secondary | ICD-10-CM | POA: Diagnosis not present

## 2018-03-25 DIAGNOSIS — R2689 Other abnormalities of gait and mobility: Secondary | ICD-10-CM | POA: Diagnosis not present

## 2018-03-25 DIAGNOSIS — M25552 Pain in left hip: Secondary | ICD-10-CM | POA: Diagnosis not present

## 2018-03-30 DIAGNOSIS — D473 Essential (hemorrhagic) thrombocythemia: Secondary | ICD-10-CM | POA: Diagnosis not present

## 2018-03-30 DIAGNOSIS — D649 Anemia, unspecified: Secondary | ICD-10-CM | POA: Diagnosis not present

## 2018-03-30 DIAGNOSIS — K5900 Constipation, unspecified: Secondary | ICD-10-CM | POA: Diagnosis not present

## 2018-03-30 DIAGNOSIS — R300 Dysuria: Secondary | ICD-10-CM | POA: Diagnosis not present

## 2018-04-01 DIAGNOSIS — Z96642 Presence of left artificial hip joint: Secondary | ICD-10-CM | POA: Diagnosis not present

## 2018-04-01 DIAGNOSIS — E876 Hypokalemia: Secondary | ICD-10-CM | POA: Diagnosis not present

## 2018-04-01 DIAGNOSIS — D649 Anemia, unspecified: Secondary | ICD-10-CM | POA: Diagnosis not present

## 2018-04-01 DIAGNOSIS — L03818 Cellulitis of other sites: Secondary | ICD-10-CM | POA: Diagnosis not present

## 2018-04-04 ENCOUNTER — Telehealth (INDEPENDENT_AMBULATORY_CARE_PROVIDER_SITE_OTHER): Payer: Self-pay

## 2018-04-04 NOTE — Telephone Encounter (Signed)
See message below. Just an [place]

## 2018-04-04 NOTE — Telephone Encounter (Signed)
Dondra Spry with [facility] wanted to let Dr. Erlinda Hong know that they will be going out to see patient on Tuesday, 04/05/2018 for physical therapy.

## 2018-04-05 ENCOUNTER — Ambulatory Visit (INDEPENDENT_AMBULATORY_CARE_PROVIDER_SITE_OTHER): Payer: Medicare Other

## 2018-04-05 ENCOUNTER — Ambulatory Visit (INDEPENDENT_AMBULATORY_CARE_PROVIDER_SITE_OTHER): Payer: Medicare Other | Admitting: Orthopaedic Surgery

## 2018-04-05 ENCOUNTER — Encounter (INDEPENDENT_AMBULATORY_CARE_PROVIDER_SITE_OTHER): Payer: Self-pay | Admitting: Orthopaedic Surgery

## 2018-04-05 VITALS — Ht 70.0 in | Wt 236.0 lb

## 2018-04-05 DIAGNOSIS — M1612 Unilateral primary osteoarthritis, left hip: Secondary | ICD-10-CM

## 2018-04-05 DIAGNOSIS — Z96652 Presence of left artificial knee joint: Secondary | ICD-10-CM

## 2018-04-05 MED ORDER — SULFAMETHOXAZOLE-TRIMETHOPRIM 800-160 MG PO TABS: 1.0000 | ORAL_TABLET | Freq: Two times a day (BID) | ORAL | 0 refills | Status: DC

## 2018-04-05 MED ORDER — MUPIROCIN 2 % EX OINT: 1.0000 "application " | TOPICAL_OINTMENT | Freq: Two times a day (BID) | CUTANEOUS | 3 refills | Status: DC

## 2018-04-05 MED ORDER — HYDROCODONE-ACETAMINOPHEN 5-325 MG PO TABS: 1.0000 | ORAL_TABLET | Freq: Every day | ORAL | 0 refills | Status: DC | PRN

## 2018-04-05 NOTE — Progress Notes (Signed)
Post-Op Visit Note   Patient: Brian [name]           Date of Birth: [birth year]           MRN: [id number] Visit Date: 04/05/2018 PCP: Orpah Melter, MD   Assessment & Plan:  Chief Complaint:  Chief Complaint  Patient presents with  . Left Hip - Routine Post Op   Visit Diagnoses:  1. Primary osteoarthritis of left hip   2. Status post total left knee replacement     Plan: Brian [name] is 4 weeks status post left total hip replacement.  He has been discharged from SNF.  He is overall doing well.  He would like to just do home exercise instead of therapy.  He is familiar with those exercises from his right total hip replacement.  He has been partial weightbearing to his left lower extremity.  He denies any significant pain.  We refilled his Norco today.  His surgical scar has a small area of superficial dehiscence at the top in the groin crease.  There is minimal drainage.  There is some surrounding erythema but no cellulitis or fluctuance.  Prescription for Bactrim and Bactroban ointment.  I would like to recheck him in 2 weeks with standing AP pelvis and lateral left hip.  Are also like to check his surgical scar at that time.  He understands to give [place] a call if there is any issues with full weightbearing to the left leg.  Follow-Up Instructions: Return in about 2 weeks (around 04/19/2018).   Orders:  Orders Placed This Encounter  Procedures  . XR HIP UNILAT W OR W/O PELVIS 2-3 VIEWS LEFT   Meds ordered this encounter  Medications  . mupirocin ointment (BACTROBAN) 2 %    Sig: Apply 1 application topically 2 (two) times daily.    Dispense:  22 g    Refill:  3  . sulfamethoxazole-trimethoprim (BACTRIM DS,SEPTRA DS) 800-160 MG tablet    Sig: Take 1 tablet by mouth 2 (two) times daily.    Dispense:  20 tablet    Refill:  0  . HYDROcodone-acetaminophen (NORCO) 5-325 MG tablet    Sig: Take 1-2 tablets by mouth daily as needed.    Dispense:  20 tablet    Refill:  0  .  sulfamethoxazole-trimethoprim (BACTRIM DS,SEPTRA DS) 800-160 MG tablet    Sig: Take 1 tablet by mouth 2 (two) times daily.    Dispense:  20 tablet    Refill:  0    Imaging: Xr Hip Unilat W Or W/o Pelvis 2-3 Views Left  Result Date: 04/05/2018 Stable left total hip replacement.  Stable greater trochanter fracture cerclage cable.  No evidence of subsidence of the femoral implant   PMFS History: Patient Active Problem List   Diagnosis Date Noted  . Primary osteoarthritis of left hip 03/07/2018  . History of hip replacement 03/07/2018  . 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 Cellulitis 01/22/2017  . Polyarthritis 12/01/2016  . Cellulitis of left leg 11/29/2016  . Sepsis ([place]) 11/29/2016  . Effusion, left knee 11/13/2016  . Persistent atrial fibrillation   . Dyspnea   . Positive urine drug screen   . CAP (community acquired pneumonia) 09/30/2016  . Community acquired pneumonia of left lower lobe of lung ([place])   . Pleural effusion   . Tachypnea   . CVA (cerebral infarction) 09/27/2015  . Right hand pain 09/27/2015  . History of  gout 09/27/2015  . Cocaine abuse ([place]) 09/27/2015  . TIA (transient ischemic attack) 09/27/2015  . Slurred speech   . Primary osteoarthritis of right hand   . Leukocytosis   . Essential hypertension   . Chronic diastolic CHF (congestive heart failure) ([place])   . HTN (hypertension) 11/22/2013  . Chronic diastolic heart failure ([place]) 11/22/2013  . Noncompliance 11/17/2013  . Atrial flutter with rapid ventricular response ([place]) 11/16/2013  . Acute on chronic diastolic CHF (congestive heart failure) ([place]) 09/05/2012  . Tobacco abuse   . Obesity- BMI 35   . Diastolic CHF ([place])   . Atrial flutter ([place])   . Anxiety    Past Medical History:  Diagnosis Date  . Anemia   . Anxiety   . Atrial flutter ([place])    a. 08/2012  . CHF (congestive heart failure) ([place])   . Dysrhythmia    a fib  .  GERD (gastroesophageal reflux disease)   . History of gout   . History of kidney stones    passed  . Hypertension   . Obesity   . Pneumonia 09/2016  . Rheumatoid arthritis ([place])    "a little bit qwhere" (07/22/2017)  . Shortness of breath    with exterion from CHF  . Tobacco abuse     Family History  Problem Relation Age of Onset  . Cancer Mother   . Heart disease Father   . Heart attack Father   . Cancer Father   . Diabetes Sister     Past Surgical History:  Procedure Laterality Date  . A-FLUTTER ABLATION N/A 12/29/2016   Procedure: A-Flutter Ablation;  Surgeon: [name], MD;  Location: [facility];  Service: Cardiovascular;  Laterality: N/A;  . CARDIOVERSION N/A 11/05/2016   Procedure: CARDIOVERSION;  Surgeon: Sueanne Margarita, MD;  Location: [facility];  Service: Cardiovascular;  Laterality: N/A;  . IR THORACENTESIS ASP PLEURAL SPACE W/IMG GUIDE  10/01/2016  . JOINT REPLACEMENT     Right hip and left knee  . MICROLARYNGOSCOPY  09/02/2007   with excision of right vocal cord mass, Dr. Constance Holster  . TOTAL HIP ARTHROPLASTY Right 11/17/2017  . TOTAL HIP ARTHROPLASTY Right 11/17/2017   Procedure: RIGHT TOTAL HIP ARTHROPLASTY ANTERIOR APPROACH;  Surgeon: Leandrew Koyanagi, MD;  Location: [facility];  Service: Orthopedics;  Laterality: Right;  . TOTAL HIP ARTHROPLASTY Left 03/07/2018   Procedure: LEFT TOTAL HIP ARTHROPLASTY ANTERIOR APPROACH;  Surgeon: Leandrew Koyanagi, MD;  Location: [facility];  Service: Orthopedics;  Laterality: Left;  . TOTAL KNEE ARTHROPLASTY Left 07/22/2017  . TOTAL KNEE ARTHROPLASTY Left 07/22/2017   Procedure: LEFT TOTAL KNEE ARTHROPLASTY;  Surgeon: Leandrew Koyanagi, MD;  Location: [facility];  Service: Orthopedics;  Laterality: Left;   Social History   Occupational History  . Not on file  Tobacco Use  . Smoking status: Current Every Day Smoker    Packs/day: 0.13    Years: 45.00    Pack years: 5.85    Types: Cigarettes  . Smokeless tobacco: Never Used  . Tobacco comment:  "quit off and on"  Substance and Sexual Activity  . Alcohol use: Yes    Comment: 07/22/2017 "nothing since 2016"  . Drug use: No    Comment: 07/22/2017 "none since 11/2016" cocaine (sister is not aware)  . Sexual activity: Not Currently

## 2018-04-19 ENCOUNTER — Encounter (INDEPENDENT_AMBULATORY_CARE_PROVIDER_SITE_OTHER): Payer: Self-pay | Admitting: Orthopaedic Surgery

## 2018-04-19 ENCOUNTER — Ambulatory Visit (INDEPENDENT_AMBULATORY_CARE_PROVIDER_SITE_OTHER): Payer: Self-pay

## 2018-04-19 ENCOUNTER — Ambulatory Visit (INDEPENDENT_AMBULATORY_CARE_PROVIDER_SITE_OTHER): Payer: Medicare Other | Admitting: Orthopaedic Surgery

## 2018-04-19 DIAGNOSIS — Z96642 Presence of left artificial hip joint: Secondary | ICD-10-CM

## 2018-04-19 NOTE — Progress Notes (Signed)
Post-Op Visit Note   Patient: Brian [name]           Date of Birth: 01-04-1951           MRN: [id number] Visit Date: 04/19/2018 PCP: Orpah Melter, MD   Assessment & Plan:  Chief Complaint:  Chief Complaint  Patient presents with  . Left Hip - Follow-up   Visit Diagnoses:  1. Status post total replacement of left hip     Plan: Brian [name] is 6 weeks status post left total hip replacement.  He is overall doing well reports no pain.  He has been placing full weight on his left lower extremity with a cane.  He is progressing with physical therapy and his rehab.  His surgical scar is almost fully healed.  He has a small area of the top of the incision that has been treated with local wound care.  This overall is improving.  There is no evidence of infection.  Recommend continued twice daily Bactroban ointment to this area.  In terms of his left hip this is doing well also.  His x-rays demonstrate stable total hip replacement and evidence of healing of the periprosthetic fracture.  There are no complications.  I like to recheck him in 6 weeks with 2 view x-rays of the left hip.  Follow-Up Instructions: Return in about 6 weeks (around 05/31/2018).   Orders:  Orders Placed This Encounter  Procedures  . XR HIP UNILAT W OR W/O PELVIS 2-3 VIEWS LEFT   No orders of the defined types were placed in this encounter.   Imaging: No results found.  PMFS History: Patient Active Problem List   Diagnosis Date Noted  . Primary osteoarthritis of left hip 03/07/2018  . History of hip replacement 03/07/2018  . 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 Cellulitis 01/22/2017  . Polyarthritis 12/01/2016  . Cellulitis of left leg 11/29/2016  . Sepsis ([place]) 11/29/2016  . Effusion, left knee 11/13/2016  . Persistent atrial fibrillation   . Dyspnea   . Positive urine drug screen   . CAP (community acquired pneumonia) 09/30/2016  .  Community acquired pneumonia of left lower lobe of lung ([place])   . Pleural effusion   . Tachypnea   . CVA (cerebral infarction) 09/27/2015  . Right hand pain 09/27/2015  . History of gout 09/27/2015  . Cocaine abuse ([place]) 09/27/2015  . TIA (transient ischemic attack) 09/27/2015  . Slurred speech   . Primary osteoarthritis of right hand   . Leukocytosis   . Essential hypertension   . Chronic diastolic CHF (congestive heart failure) ([place])   . HTN (hypertension) 11/22/2013  . Chronic diastolic heart failure ([place]) 11/22/2013  . Noncompliance 11/17/2013  . Atrial flutter with rapid ventricular response ([place]) 11/16/2013  . Acute on chronic diastolic CHF (congestive heart failure) ([place]) 09/05/2012  . Tobacco abuse   . Obesity- BMI 35   . Diastolic CHF ([place])   . Atrial flutter ([place])   . Anxiety    Past Medical History:  Diagnosis Date  . Anemia   . Anxiety   . Atrial flutter ([place])    a. 08/2012  . CHF (congestive heart failure) ([place])   . Dysrhythmia    a fib  . GERD (gastroesophageal reflux disease)   . History of gout   . History of kidney stones    passed  . Hypertension   . Obesity   . Pneumonia 09/2016  .  Rheumatoid arthritis ([place])    "a little bit qwhere" (07/22/2017)  . Shortness of breath    with exterion from CHF  . Tobacco abuse     Family History  Problem Relation Age of Onset  . Cancer Mother   . Heart disease Father   . Heart attack Father   . Cancer Father   . Diabetes Sister     Past Surgical History:  Procedure Laterality Date  . A-FLUTTER ABLATION N/A 12/29/2016   Procedure: A-Flutter Ablation;  Surgeon: [name], MD;  Location: [facility];  Service: Cardiovascular;  Laterality: N/A;  . CARDIOVERSION N/A 11/05/2016   Procedure: CARDIOVERSION;  Surgeon: Sueanne Margarita, MD;  Location: [facility];  Service: Cardiovascular;  Laterality: N/A;  . IR THORACENTESIS ASP PLEURAL SPACE W/IMG GUIDE  10/01/2016  . JOINT REPLACEMENT     Right hip and  left knee  . MICROLARYNGOSCOPY  09/02/2007   with excision of right vocal cord mass, Dr. Constance Holster  . TOTAL HIP ARTHROPLASTY Right 11/17/2017  . TOTAL HIP ARTHROPLASTY Right 11/17/2017   Procedure: RIGHT TOTAL HIP ARTHROPLASTY ANTERIOR APPROACH;  Surgeon: Leandrew Koyanagi, MD;  Location: [facility];  Service: Orthopedics;  Laterality: Right;  . TOTAL HIP ARTHROPLASTY Left 03/07/2018   Procedure: LEFT TOTAL HIP ARTHROPLASTY ANTERIOR APPROACH;  Surgeon: Leandrew Koyanagi, MD;  Location: [facility];  Service: Orthopedics;  Laterality: Left;  . TOTAL KNEE ARTHROPLASTY Left 07/22/2017  . TOTAL KNEE ARTHROPLASTY Left 07/22/2017   Procedure: LEFT TOTAL KNEE ARTHROPLASTY;  Surgeon: Leandrew Koyanagi, MD;  Location: [facility];  Service: Orthopedics;  Laterality: Left;   Social History   Occupational History  . Not on file  Tobacco Use  . Smoking status: Current Every Day Smoker    Packs/day: 0.13    Years: 45.00    Pack years: 5.85    Types: Cigarettes  . Smokeless tobacco: Never Used  . Tobacco comment: "quit off and on"  Substance and Sexual Activity  . Alcohol use: Yes    Comment: 07/22/2017 "nothing since 2016"  . Drug use: No    Comment: 07/22/2017 "none since 11/2016" cocaine (sister is not aware)  . Sexual activity: Not Currently

## 2018-04-29 ENCOUNTER — Other Ambulatory Visit: Payer: Self-pay | Admitting: Cardiology

## 2018-04-29 DIAGNOSIS — I5032 Chronic diastolic (congestive) heart failure: Secondary | ICD-10-CM

## 2018-04-29 DIAGNOSIS — I4892 Unspecified atrial flutter: Secondary | ICD-10-CM

## 2018-05-09 ENCOUNTER — Encounter (HOSPITAL_COMMUNITY): Payer: Self-pay

## 2018-05-09 ENCOUNTER — Emergency Department (HOSPITAL_COMMUNITY)
Admission: EM | Admit: 2018-05-09 | Discharge: 2018-05-09 | Disposition: A | Discharge status: Home / Self Care | Payer: Medicare Other | Attending: Emergency Medicine | Admitting: Emergency Medicine

## 2018-05-09 ENCOUNTER — Emergency Department (HOSPITAL_COMMUNITY): Payer: Medicare Other

## 2018-05-09 ENCOUNTER — Other Ambulatory Visit: Payer: Self-pay

## 2018-05-09 DIAGNOSIS — Z79899 Other long term (current) drug therapy: Secondary | ICD-10-CM | POA: Diagnosis not present

## 2018-05-09 DIAGNOSIS — F17210 Nicotine dependence, cigarettes, uncomplicated: Secondary | ICD-10-CM | POA: Diagnosis not present

## 2018-05-09 DIAGNOSIS — N390 Urinary tract infection, site not specified: Secondary | ICD-10-CM | POA: Insufficient documentation

## 2018-05-09 DIAGNOSIS — I110 Hypertensive heart disease with heart failure: Secondary | ICD-10-CM | POA: Diagnosis not present

## 2018-05-09 DIAGNOSIS — Z96643 Presence of artificial hip joint, bilateral: Secondary | ICD-10-CM | POA: Insufficient documentation

## 2018-05-09 DIAGNOSIS — Z96652 Presence of left artificial knee joint: Secondary | ICD-10-CM | POA: Insufficient documentation

## 2018-05-09 DIAGNOSIS — R310 Gross hematuria: Secondary | ICD-10-CM | POA: Diagnosis not present

## 2018-05-09 DIAGNOSIS — I5032 Chronic diastolic (congestive) heart failure: Secondary | ICD-10-CM | POA: Insufficient documentation

## 2018-05-09 DIAGNOSIS — R319 Hematuria, unspecified: Secondary | ICD-10-CM | POA: Diagnosis not present

## 2018-05-09 LAB — URINALYSIS, MICROSCOPIC (REFLEX): RBC / HPF: >50 RBC/hpf (ref 0–5)

## 2018-05-09 LAB — CBC WITH DIFFERENTIAL/PLATELET
ABS IMMATURE GRANULOCYTES: 0.14 10*3/uL — AB (ref 0.00–0.07)
BASOS PCT: 1 %
Basophils Absolute: 0.1 10*3/uL (ref 0.0–0.1)
EOS PCT: 4 %
Eosinophils Absolute: 0.5 10*3/uL (ref 0.0–0.5)
HCT: 41.3 % (ref 39.0–52.0)
Hemoglobin: 11.7 g/dL — ABNORMAL LOW (ref 13.0–17.0)
Immature Granulocytes: 1 %
Lymphocytes Relative: 18 %
Lymphs Abs: 2.1 10*3/uL (ref 0.7–4.0)
MCH: 25.1 pg — AB (ref 26.0–34.0)
MCHC: 28.3 g/dL — AB (ref 30.0–36.0)
MCV: 88.4 fL (ref 80.0–100.0)
MONO ABS: 1 10*3/uL (ref 0.1–1.0)
MONOS PCT: 8 %
NEUTROS ABS: 7.9 10*3/uL — AB (ref 1.7–7.7)
Neutrophils Relative %: 68 %
Platelets: 400 10*3/uL (ref 150–400)
RBC: 4.67 MIL/uL (ref 4.22–5.81)
RDW: 14.6 % (ref 11.5–15.5)
WBC: 11.6 10*3/uL — AB (ref 4.0–10.5)
nRBC: 0 % (ref 0.0–0.2)

## 2018-05-09 LAB — BASIC METABOLIC PANEL
Anion gap: 10 (ref 5–15)
BUN: 16 mg/dL (ref 8–23)
CALCIUM: 9.1 mg/dL (ref 8.9–10.3)
CO2: 24 mmol/L (ref 22–32)
CREATININE: 0.8 mg/dL (ref 0.61–1.24)
Chloride: 104 mmol/L (ref 98–111)
GFR calc Af Amer: >60 mL/min (ref >60)
GLUCOSE: 127 mg/dL — AB (ref 70–99)
POTASSIUM: 4 mmol/L (ref 3.5–5.1)
SODIUM: 138 mmol/L (ref 135–145)

## 2018-05-09 LAB — URINALYSIS, ROUTINE W REFLEX MICROSCOPIC
PH: 6.5 (ref 5.0–8.0)
Specific Gravity, Urine: 1.025 (ref 1.005–1.030)

## 2018-05-09 MED ORDER — CEPHALEXIN 250 MG PO CAPS
500.0000 mg | ORAL_CAPSULE | Freq: Once | ORAL | Status: AC
  Administered 2018-05-09: 500 mg via ORAL
  Filled 2018-05-09: qty 2

## 2018-05-09 MED ORDER — MORPHINE SULFATE (PF) 4 MG/ML IV SOLN
4.0000 mg | Freq: Once | INTRAVENOUS | Status: AC
  Administered 2018-05-09: 4 mg via INTRAVENOUS
  Filled 2018-05-09: qty 1

## 2018-05-09 MED ORDER — CEPHALEXIN 500 MG PO CAPS: 500.0000 mg | ORAL_CAPSULE | Freq: Two times a day (BID) | ORAL | 0 refills | Status: AC

## 2018-05-09 NOTE — ED Triage Notes (Signed)
Pt BIB POV for eval of hematuria x 2 days. Pt reports dysuria, denies frequency. Denies fever, N/V. Denies abd pain.

## 2018-05-09 NOTE — Discharge Instructions (Addendum)
If you develop fever, vomiting, back or abdominal pain/flank pain, or any other new/concerning symptoms and return to the ER for evaluation.

## 2018-05-09 NOTE — ED Notes (Signed)
Patient verbalizes understanding of discharge instructions. Opportunity for questioning and answers were provided. Armband removed by staff, pt discharged from ED ambulatory w/ roommate

## 2018-05-09 NOTE — ED Provider Notes (Signed)
[facility] EMERGENCY DEPARTMENT Provider Note   CSN: [id number] Arrival date & time: 05/09/18  1140     History   Chief Complaint Chief Complaint  Patient presents with  . Hematuria    HPI Brian [name] is a 67 y.o. male.  HPI  66 year old male presents with hematuria and dysuria.  The hematuria started 3 days ago and seems to be more prominent.  He is on diuretics and states he always has urinary frequency and is not worse today.  He is also having dysuria at the end of urination.  Similar to when he had a UTI about a month ago.  No fevers, vomiting.  He has some mild low back pain and some mild discomfort in his left testicle.  All of this is similar to when he had a UTI a month ago 2.  Does not feel like prior kidney stones.  He is on Xarelto for atrial flutter.  He is currently diaphoretic and tachycardic and states that he had a walk a very long distance because he could not find a parking spot to get to the ER.  He states that he feels like he is out of shape and that all of those current symptoms are from having to walk so far.  Has not been diaphoretic prior to just now.  No testicular swelling.  Past Medical History:  Diagnosis Date  . Anemia   . Anxiety   . Atrial flutter ([place])    a. 08/2012  . CHF (congestive heart failure) ([place])   . Dysrhythmia    a fib  . GERD (gastroesophageal reflux disease)   . History of gout   . History of kidney stones    passed  . Hypertension   . Obesity   . Pneumonia 09/2016  . Rheumatoid arthritis ([place])    "a little bit qwhere" (07/22/2017)  . Shortness of breath    with exterion from CHF  . Tobacco abuse     Patient Active Problem List   Diagnosis Date Noted  . Primary osteoarthritis of left hip 03/07/2018  . History of hip replacement 03/07/2018  . 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 Cellulitis 01/22/2017  . Polyarthritis 12/01/2016  .  Cellulitis of left leg 11/29/2016  . Sepsis ([place]) 11/29/2016  . Effusion, left knee 11/13/2016  . Persistent atrial fibrillation   . Dyspnea   . Positive urine drug screen   . CAP (community acquired pneumonia) 09/30/2016  . Community acquired pneumonia of left lower lobe of lung ([place])   . Pleural effusion   . Tachypnea   . CVA (cerebral infarction) 09/27/2015  . Right hand pain 09/27/2015  . History of gout 09/27/2015  . Cocaine abuse ([place]) 09/27/2015  . TIA (transient ischemic attack) 09/27/2015  . Slurred speech   . Primary osteoarthritis of right hand   . Leukocytosis   . Essential hypertension   . Chronic diastolic CHF (congestive heart failure) ([place])   . HTN (hypertension) 11/22/2013  . Chronic diastolic heart failure ([place]) 11/22/2013  . Noncompliance 11/17/2013  . Atrial flutter with rapid ventricular response ([place]) 11/16/2013  . Acute on chronic diastolic CHF (congestive heart failure) ([place]) 09/05/2012  . Tobacco abuse   . Obesity- BMI 35   . Diastolic CHF ([place])   . Atrial flutter ([place])   . Anxiety     Past Surgical History:  Procedure Laterality Date  . A-FLUTTER ABLATION  N/A 12/29/2016   Procedure: A-Flutter Ablation;  Surgeon: [name], MD;  Location: [facility];  Service: Cardiovascular;  Laterality: N/A;  . CARDIOVERSION N/A 11/05/2016   Procedure: CARDIOVERSION;  Surgeon: Sueanne Margarita, MD;  Location: [facility];  Service: Cardiovascular;  Laterality: N/A;  . IR THORACENTESIS ASP PLEURAL SPACE W/IMG GUIDE  10/01/2016  . JOINT REPLACEMENT     Right hip and left knee  . MICROLARYNGOSCOPY  09/02/2007   with excision of right vocal cord mass, Dr. Constance Holster  . TOTAL HIP ARTHROPLASTY Right 11/17/2017  . TOTAL HIP ARTHROPLASTY Right 11/17/2017   Procedure: RIGHT TOTAL HIP ARTHROPLASTY ANTERIOR APPROACH;  Surgeon: Leandrew Koyanagi, MD;  Location: [facility];  Service: Orthopedics;  Laterality: Right;  . TOTAL HIP ARTHROPLASTY Left 03/07/2018   Procedure: LEFT TOTAL HIP  ARTHROPLASTY ANTERIOR APPROACH;  Surgeon: Leandrew Koyanagi, MD;  Location: [facility];  Service: Orthopedics;  Laterality: Left;  . TOTAL KNEE ARTHROPLASTY Left 07/22/2017  . TOTAL KNEE ARTHROPLASTY Left 07/22/2017   Procedure: LEFT TOTAL KNEE ARTHROPLASTY;  Surgeon: Leandrew Koyanagi, MD;  Location: [facility];  Service: Orthopedics;  Laterality: Left;        Home Medications    Prior to Admission medications   Medication Sig Start Date End Date Taking? Authorizing Provider  acetaminophen (TYLENOL) 500 MG tablet Take 1,000 mg by mouth 3 (three) times daily as needed (Pain).    [provider]  cephALEXin (KEFLEX) 500 MG capsule Take 1 capsule (500 mg total) by mouth 2 (two) times daily for 5 days. 05/09/18 05/14/18  Sherwood Gambler, MD  docusate sodium (COLACE) 100 MG capsule Take 200 mg by mouth 2 (two) times daily.     [provider]  famotidine (PEPCID) 20 MG tablet Take 1 tablet (20 mg total) by mouth at bedtime. 07/25/17   Jessy Oto, MD  folic acid (FOLVITE) 1 MG tablet Take 1 mg by mouth daily. 04/15/17   [provider]  furosemide (LASIX) 40 MG tablet Take 1 tablet (40 mg total) by mouth daily. Please call and schedule an appt for further refills, 1st attempt Patient taking differently: Take 40 mg by mouth 2 (two) times daily. Please call and schedule an appt for further refills, 1st attempt 11/18/17   Jerline Pain, MD  HYDROcodone-acetaminophen (NORCO) 5-325 MG tablet Take 1-2 tablets by mouth daily as needed. 04/05/18   Leandrew Koyanagi, MD  Lidocaine (ASPERCREME LIDOCAINE) 4 % PTCH Apply 2 patches topically daily. Apply 2 patches on left hip daily. Remove after 12 hours.    [provider]  methocarbamol (ROBAXIN) 750 MG tablet Take 1 tablet (750 mg total) by mouth 2 (two) times daily as needed for muscle spasms. 03/07/18   Leandrew Koyanagi, MD  methotrexate (RHEUMATREX) 2.5 MG tablet Take 20 mg by mouth every Friday. In the morning. 04/26/17   [provider]  metoprolol succinate (TOPROL-XL) 25 MG 24 hr tablet TAKE 1 TABLET (25 MG TOTAL) 2 (TWO) TIMES DAILY BY MOUTH. 04/29/18   Jerline Pain, MD  mupirocin ointment (BACTROBAN) 2 % Apply to affected area 3 times daily 5 days pre-op 11/08/17 11/08/18  Aundra Dubin, PA-C  mupirocin ointment (BACTROBAN) 2 % Apply 1 application topically 2 (two) times daily. 03/07/18   Leandrew Koyanagi, MD  mupirocin ointment (BACTROBAN) 2 % Apply 1 application topically 2 (two) times daily. 04/05/18   Leandrew Koyanagi, MD  ondansetron (ZOFRAN) 4 MG tablet Take  1-2 tablets (4-8 mg total) by mouth every 8 (eight) hours as needed for nausea or vomiting. 03/07/18   Leandrew Koyanagi, MD  oxyCODONE (OXY IR/ROXICODONE) 5 MG immediate release tablet Take 1-3 tablets (5-15 mg total) by mouth every 4 (four) hours as needed. Patient taking differently: Take 5-15 mg by mouth every 4 (four) hours as needed (Pain).  03/07/18   Leandrew Koyanagi, MD  potassium chloride SA (K-DUR,KLOR-CON) 20 MEQ tablet Take 1 tablet (20 mEq total) by mouth daily. 10/26/16   Jerline Pain, MD  promethazine (PHENERGAN) 25 MG tablet Take 1 tablet (25 mg total) by mouth every 6 (six) hours as needed for nausea. 03/07/18   Leandrew Koyanagi, MD  ranitidine (ZANTAC) 150 MG tablet Take 150 mg by mouth daily.    [provider]  senna-docusate (SENOKOT S) 8.6-50 MG tablet Take 1 tablet by mouth at bedtime as needed. Patient taking differently: Take 1 tablet by mouth at bedtime as needed (Constipation).  03/07/18   Leandrew Koyanagi, MD  sodium phosphate (FLEET) 7-19 GM/118ML ENEM Place 1 enema rectally daily as needed for severe constipation.    [provider]  sulfamethoxazole-trimethoprim (BACTRIM DS,SEPTRA DS) 800-160 MG tablet Take 1 tablet by mouth 2 (two) times daily. 03/07/18   Leandrew Koyanagi, MD  sulfamethoxazole-trimethoprim (BACTRIM DS,SEPTRA DS) 800-160 MG tablet Take 1 tablet by mouth 2 (two) times daily. 04/05/18   Leandrew Koyanagi, MD  sulfamethoxazole-trimethoprim  (BACTRIM DS,SEPTRA DS) 800-160 MG tablet Take 1 tablet by mouth 2 (two) times daily. 04/05/18   Leandrew Koyanagi, MD  XARELTO 20 MG TABS tablet TAKE 1 TABLET (20 MG TOTAL) BY MOUTH DAILY WITH SUPPER. 02/23/18   [name], MD    Family History Family History  Problem Relation Age of Onset  . Cancer Mother   . Heart disease Father   . Heart attack Father   . Cancer Father   . Diabetes Sister     Social History Social History   Tobacco Use  . Smoking status: Current Every Day Smoker    Packs/day: 0.13    Years: 45.00    Pack years: 5.85    Types: Cigarettes  . Smokeless tobacco: Never Used  . Tobacco comment: "quit off and on"  Substance Use Topics  . Alcohol use: Yes    Comment: 07/22/2017 "nothing since 2016"  . Drug use: No    Comment: 07/22/2017 "none since 11/2016" cocaine (sister is not aware)     Allergies   Tape   Review of Systems Review of Systems  Constitutional: Negative for fever.  Gastrointestinal: Negative for abdominal pain and vomiting.  Genitourinary: Positive for dysuria, hematuria and testicular pain (mild, left). Negative for frequency, penile pain and scrotal swelling.  Musculoskeletal: Positive for back pain.  All other systems reviewed and are negative.    Physical Exam Updated Vital Signs BP 128/67 (BP Location: Left Arm)   Pulse (!) 108   Temp 98.3 F (36.8 C) (Oral)   Resp 20   Ht 5\' 10"  (1.778 m)   Wt 110.2 kg   SpO2 97%   BMI 34.87 kg/m   Physical Exam  Constitutional: He appears well-developed and well-nourished.  HENT:  Head: Normocephalic and atraumatic.  Right Ear: External ear normal.  Left Ear: External ear normal.  Nose: Nose normal.  Eyes: Right eye exhibits no discharge. Left eye exhibits no discharge.  Neck: Neck supple.  Cardiovascular: Regular rhythm and normal heart sounds. Tachycardia present.  Pulmonary/Chest: Effort normal and breath sounds normal.  Abdominal: Soft. There is no tenderness. There is no CVA  tenderness.  Genitourinary: Right testis shows no swelling and no tenderness. Left testis shows no swelling and no tenderness. No penile tenderness.  Musculoskeletal: He exhibits no edema.  Neurological: He is alert.  Skin: Skin is warm. He is diaphoretic.  Psychiatric: His mood appears not anxious.  Nursing note and vitals reviewed.    ED Treatments / Results  Labs (all labs ordered are listed, but only abnormal results are displayed) Labs Reviewed  BASIC METABOLIC PANEL - Abnormal; Notable for the following components:      Result Value   Glucose, Bld 127 (*)    All other components within normal limits  CBC WITH DIFFERENTIAL/PLATELET - Abnormal; Notable for the following components:   WBC 11.6 (*)    Hemoglobin 11.7 (*)    MCH 25.1 (*)    MCHC 28.3 (*)    Neutro Abs 7.9 (*)    Abs Immature Granulocytes 0.14 (*)    All other components within normal limits  URINALYSIS, ROUTINE W REFLEX MICROSCOPIC - Abnormal; Notable for the following components:   Color, Urine RED (*)    APPearance CLOUDY (*)    Glucose, UA   (*)    Value: TEST NOT REPORTED DUE TO COLOR INTERFERENCE OF URINE PIGMENT   Hgb urine dipstick   (*)    Value: TEST NOT REPORTED DUE TO COLOR INTERFERENCE OF URINE PIGMENT   Bilirubin Urine   (*)    Value: TEST NOT REPORTED DUE TO COLOR INTERFERENCE OF URINE PIGMENT   Ketones, ur   (*)    Value: TEST NOT REPORTED DUE TO COLOR INTERFERENCE OF URINE PIGMENT   Protein, ur   (*)    Value: TEST NOT REPORTED DUE TO COLOR INTERFERENCE OF URINE PIGMENT   Nitrite   (*)    Value: TEST NOT REPORTED DUE TO COLOR INTERFERENCE OF URINE PIGMENT   Leukocytes, UA   (*)    Value: TEST NOT REPORTED DUE TO COLOR INTERFERENCE OF URINE PIGMENT   All other components within normal limits  URINALYSIS, MICROSCOPIC (REFLEX) - Abnormal; Notable for the following components:   Bacteria, UA FEW (*)    All other components within normal limits  URINE CULTURE  URINALYSIS, MICROSCOPIC  (REFLEX)    EKG None  Radiology Ct Renal Stone Study  Result Date: 05/09/2018 CLINICAL DATA:  Hematuria for 2 days EXAM: CT ABDOMEN AND PELVIS WITHOUT CONTRAST TECHNIQUE: Multidetector CT imaging of the abdomen and pelvis was performed following the standard protocol without IV contrast. COMPARISON:  04/17/2014 FINDINGS: Lower chest: Lung bases are free of acute infiltrate or sizable effusion. Some minimal scarring is noted in the left lung base. Additionally a stable nodule is noted in the right lower lobe laterally unchanged from the previous exam consistent with a benign etiology. Hepatobiliary: No focal liver abnormality is seen. No gallstones, gallbladder wall thickening, or biliary dilatation. Pancreas: Unremarkable. No pancreatic ductal dilatation or surrounding inflammatory changes. Spleen: Normal in size without focal abnormality. Adrenals/Urinary Tract: No renal calculi or urinary tract obstructive changes are noted. The adrenal glands are within normal limits. The bladder is partially distended but somewhat obscured by scatter artifact from bilateral hip replacements. Stomach/Bowel: Stomach is within normal limits. Appendix appears normal. No evidence of bowel wall thickening, distention, or inflammatory changes. Vascular/Lymphatic: Aortic atherosclerosis. No enlarged abdominal or pelvic lymph nodes. Reproductive: Prostate is unremarkable. Other: No abdominal wall hernia  or abnormality. No abdominopelvic ascites. Musculoskeletal: Changes of bilateral hip replacement are noted. Degenerative changes of the lumbar spine are seen. IMPRESSION: No renal calculi or obstructive changes. Stable right lower lobe nodule consistent with a benign etiology. No acute abnormality noted. Electronically Signed   By: Inez Catalina M.D.   On: 05/09/2018 15:14    Procedures Procedures (including critical care time)  Medications Ordered in ED Medications  morphine 4 MG/ML injection 4 mg (4 mg Intravenous Given  05/09/18 1321)  cephALEXin (KEFLEX) capsule 500 mg (500 mg Oral Given 05/09/18 1542)     Initial Impression / Assessment and Plan / ED Course  I have reviewed the triage vital signs and the nursing notes.  Pertinent labs & imaging results that were available during my care of the patient were reviewed by me and considered in my medical decision making (see chart for details).  Clinical Course as of May 09 1546  Mon May 09, 2018  1207 Patient is diaphoretic and tachycardic but he states this is from walking a very long distance to get to the ER.  I will give him time to rest and we will recheck his vital signs.  If he still seems to be tachycardic or showing signs of more severe illness then will broaden approach.   [SG]    Clinical Course User Index [SG] Sherwood Gambler, MD    Hematuria is likely from a UTI that is causing some bleeding and is exacerbated by his Xarelto.  Labs are reassuring.  Urine will be sent for culture and he will be placed on Keflex.  Otherwise, the tachycardia significantly improved at rest and I think that original tachycardia and diaphoresis was from his exertion coming into the hospital from the parking lot.  Doubt sepsis or more severe illness.  Given the back pain, stone study obtained.  He reports a little bit of left testicular discomfort but not really a pain or swelling.  I doubt torsion, especially given no swelling or tenderness now.  Stone study is negative.  Discharged home with return precautions.  Final Clinical Impressions(s) / ED Diagnoses   Final diagnoses:  Gross hematuria  Acute UTI (urinary tract infection)    ED Discharge Orders         Ordered    cephALEXin (KEFLEX) 500 MG capsule  2 times daily     05/09/18 1530           Sherwood Gambler, MD 05/09/18 1548

## 2018-05-11 LAB — URINE CULTURE

## 2018-05-12 ENCOUNTER — Telehealth: Payer: Self-pay | Admitting: *Deleted

## 2018-05-12 NOTE — Telephone Encounter (Signed)
Post ED Visit - Positive Culture Follow-up  Culture report reviewed by antimicrobial stewardship pharmacist:  []  Elenor Quinones, Pharm.D. []  Heide Guile, Pharm.D., BCPS AQ-ID []  Parks Neptune, Pharm.D., BCPS []  Alycia Rossetti, Pharm.D., BCPS []  [place], Pharm.D., BCPS, AAHIVP []  Legrand Como, Pharm.D., BCPS, AAHIVP []  Salome Arnt, PharmD, BCPS []  Johnnette Gourd, PharmD, BCPS []  Hughes Better, PharmD, BCPS []  Leeroy Cha, PharmD Tamela Gammon, PharmD  Positive urine culture Treated with Cephalexin, organism sensitive to the same and no further patient follow-up is required at this time.  Harlon Flor [facility] 05/12/2018, 10:36 AM

## 2018-05-31 ENCOUNTER — Ambulatory Visit (INDEPENDENT_AMBULATORY_CARE_PROVIDER_SITE_OTHER): Payer: Medicare Other

## 2018-05-31 ENCOUNTER — Encounter (INDEPENDENT_AMBULATORY_CARE_PROVIDER_SITE_OTHER): Payer: Self-pay | Admitting: Orthopaedic Surgery

## 2018-05-31 ENCOUNTER — Ambulatory Visit (INDEPENDENT_AMBULATORY_CARE_PROVIDER_SITE_OTHER): Payer: Medicare Other | Admitting: Orthopaedic Surgery

## 2018-05-31 DIAGNOSIS — Z96641 Presence of right artificial hip joint: Secondary | ICD-10-CM

## 2018-05-31 DIAGNOSIS — Z96642 Presence of left artificial hip joint: Secondary | ICD-10-CM | POA: Diagnosis not present

## 2018-05-31 MED ORDER — HYDROCODONE-ACETAMINOPHEN 5-325 MG PO TABS: 1.0000 | ORAL_TABLET | Freq: Every day | ORAL | 0 refills | Status: DC | PRN

## 2018-05-31 NOTE — Progress Notes (Signed)
Post-Op Visit Note   Patient: Brian [name]           Date of Birth: [birth year]           MRN: [id number] Visit Date: 05/31/2018 PCP: Orpah Melter, MD   Assessment & Plan:  Chief Complaint:  Chief Complaint  Patient presents with  . Left Hip - Routine Post Op, Pain, Follow-up   Visit Diagnoses:  1. Status post total replacement of left hip   2. Status post total replacement of right hip     Plan: Mr. Baswell is 63 days status post left total hip replacement.  He is taking occasional pain medicine.  He is walking with a cane.  He states that he has moderate discomfort in the left hip when he has been standing or sitting for prolonged period of time.  He denies any groin pain.  When I move his hip around he seems to be having discomfort in the trochanteric region likely as a result from the fracture or even the cerclage cable.  The implant itself appears to be stable and does not show any subsidence or complications.  The fracture appears to be healing.  Overall clinically I think he is improving.  He understands that this recovery is been below bit longer than the other hip replacement.  I would like to recheck him in 3 months with standing AP pelvis and lateral hip on return.  He knows to come in sooner should he have any issues or questions or concerns.  Follow-Up Instructions: Return in about 3 months (around 08/30/2018).   Orders:  Orders Placed This Encounter  Procedures  . XR HIP UNILAT W OR W/O PELVIS 2-3 VIEWS LEFT   Meds ordered this encounter  Medications  . HYDROcodone-acetaminophen (NORCO) 5-325 MG tablet    Sig: Take 1-2 tablets by mouth daily as needed.    Dispense:  20 tablet    Refill:  0    Imaging: Xr Hip Unilat W Or W/o Pelvis 2-3 Views Left  Result Date: 05/31/2018 Stable bilateral total hip replacements.  The trochanteric fracture appears to be healed with the intact cable   PMFS History: Patient Active Problem List   Diagnosis Date Noted  .  Primary osteoarthritis of left hip 03/07/2018  . History of hip replacement 03/07/2018  . 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 Cellulitis 01/22/2017  . Polyarthritis 12/01/2016  . Cellulitis of left leg 11/29/2016  . Sepsis ([place]) 11/29/2016  . Effusion, left knee 11/13/2016  . Persistent atrial fibrillation   . Dyspnea   . Positive urine drug screen   . CAP (community acquired pneumonia) 09/30/2016  . Community acquired pneumonia of left lower lobe of lung ([place])   . Pleural effusion   . Tachypnea   . CVA (cerebral infarction) 09/27/2015  . Right hand pain 09/27/2015  . History of gout 09/27/2015  . Cocaine abuse ([place]) 09/27/2015  . TIA (transient ischemic attack) 09/27/2015  . Slurred speech   . Primary osteoarthritis of right hand   . Leukocytosis   . Essential hypertension   . Chronic diastolic CHF (congestive heart failure) ([place])   . HTN (hypertension) 11/22/2013  . Chronic diastolic heart failure ([place]) 11/22/2013  . Noncompliance 11/17/2013  . Atrial flutter with rapid ventricular response ([place]) 11/16/2013  . Acute on chronic diastolic CHF (congestive heart failure) ([place]) 09/05/2012  . Tobacco abuse   . Obesity- BMI 35   .  Diastolic CHF ([place])   . Atrial flutter ([place])   . Anxiety    Past Medical History:  Diagnosis Date  . Anemia   . Anxiety   . Atrial flutter ([place])    a. 08/2012  . CHF (congestive heart failure) ([place])   . Dysrhythmia    a fib  . GERD (gastroesophageal reflux disease)   . History of gout   . History of kidney stones    passed  . Hypertension   . Obesity   . Pneumonia 09/2016  . Rheumatoid arthritis ([place])    "a little bit qwhere" (07/22/2017)  . Shortness of breath    with exterion from CHF  . Tobacco abuse     Family History  Problem Relation Age of Onset  . Cancer Mother   . Heart disease Father   . Heart attack Father   . Cancer Father   . Diabetes Sister     Past  Surgical History:  Procedure Laterality Date  . A-FLUTTER ABLATION N/A 12/29/2016   Procedure: A-Flutter Ablation;  Surgeon: [name], MD;  Location: [facility];  Service: Cardiovascular;  Laterality: N/A;  . CARDIOVERSION N/A 11/05/2016   Procedure: CARDIOVERSION;  Surgeon: Sueanne Margarita, MD;  Location: [facility];  Service: Cardiovascular;  Laterality: N/A;  . IR THORACENTESIS ASP PLEURAL SPACE W/IMG GUIDE  10/01/2016  . JOINT REPLACEMENT     Right hip and left knee  . MICROLARYNGOSCOPY  09/02/2007   with excision of right vocal cord mass, Dr. Constance Holster  . TOTAL HIP ARTHROPLASTY Right 11/17/2017  . TOTAL HIP ARTHROPLASTY Right 11/17/2017   Procedure: RIGHT TOTAL HIP ARTHROPLASTY ANTERIOR APPROACH;  Surgeon: Leandrew Koyanagi, MD;  Location: [facility];  Service: Orthopedics;  Laterality: Right;  . TOTAL HIP ARTHROPLASTY Left 03/07/2018   Procedure: LEFT TOTAL HIP ARTHROPLASTY ANTERIOR APPROACH;  Surgeon: Leandrew Koyanagi, MD;  Location: [facility];  Service: Orthopedics;  Laterality: Left;  . TOTAL KNEE ARTHROPLASTY Left 07/22/2017  . TOTAL KNEE ARTHROPLASTY Left 07/22/2017   Procedure: LEFT TOTAL KNEE ARTHROPLASTY;  Surgeon: Leandrew Koyanagi, MD;  Location: [facility];  Service: Orthopedics;  Laterality: Left;   Social History   Occupational History  . Not on file  Tobacco Use  . Smoking status: Current Every Day Smoker    Packs/day: 0.13    Years: 45.00    Pack years: 5.85    Types: Cigarettes  . Smokeless tobacco: Never Used  . Tobacco comment: "quit off and on"  Substance and Sexual Activity  . Alcohol use: Yes    Comment: 07/22/2017 "nothing since 2016"  . Drug use: No    Comment: 07/22/2017 "none since 11/2016" cocaine (sister is not aware)  . Sexual activity: Not Currently

## 2018-06-23 ENCOUNTER — Other Ambulatory Visit: Payer: Self-pay | Admitting: Cardiology

## 2018-07-26 ENCOUNTER — Telehealth: Payer: Self-pay

## 2018-07-26 NOTE — Telephone Encounter (Addendum)
The pt brought a Wynetta Emery and [employer] pt asst application (no 1898 out of pocket expense report) to the office yesterday with request for [place] to complete the provider part of the application, have Dr Rayann Heman sign it, fax it to [employer] and to call him once it has been faxed.  The pts last OV was 03/22/2017 with Roderic Palau, NP and a f/u was not established at that [place].  I will address the pt asst application and forward this message to Roderic Palau, NP as the pt may need a f/u.

## 2018-07-29 DIAGNOSIS — M0579 Rheumatoid arthritis with rheumatoid factor of multiple sites without organ or systems involvement: Secondary | ICD-10-CM | POA: Diagnosis not present

## 2018-07-29 DIAGNOSIS — M150 Primary generalized (osteo)arthritis: Secondary | ICD-10-CM | POA: Diagnosis not present

## 2018-07-29 DIAGNOSIS — E669 Obesity, unspecified: Secondary | ICD-10-CM | POA: Diagnosis not present

## 2018-07-29 DIAGNOSIS — D508 Other iron deficiency anemias: Secondary | ICD-10-CM | POA: Diagnosis not present

## 2018-07-29 DIAGNOSIS — Z6834 Body mass index (BMI) 34.0-34.9, adult: Secondary | ICD-10-CM | POA: Diagnosis not present

## 2018-07-29 DIAGNOSIS — R5382 Chronic fatigue, unspecified: Secondary | ICD-10-CM | POA: Diagnosis not present

## 2018-07-29 DIAGNOSIS — M2550 Pain in unspecified joint: Secondary | ICD-10-CM | POA: Diagnosis not present

## 2018-07-29 NOTE — Telephone Encounter (Signed)
**Note De-Identified  Obfuscation** I have completed the provider part of the [facility] pt asst application and placed it in Dr Bonita Quin mail bin awaiting his signature.

## 2018-08-02 NOTE — Telephone Encounter (Signed)
Per request from the pts sister, [place], I called to let them know that I have faxed the pts Brian [name] and Brian [name] pt asst application to [employer].  The pt answered the phone. I advised him that it is unlikely that he will be approved for pt asst with his Xarelto because he has insurance coverage for his medications.Marland Kitchen  He verbalized understanding and thanked mr for calling him.

## 2018-08-08 DIAGNOSIS — Z1211 Encounter for screening for malignant neoplasm of colon: Secondary | ICD-10-CM | POA: Diagnosis not present

## 2018-08-08 DIAGNOSIS — D509 Iron deficiency anemia, unspecified: Secondary | ICD-10-CM | POA: Diagnosis not present

## 2018-08-11 ENCOUNTER — Encounter: Payer: Self-pay | Admitting: Internal Medicine

## 2018-08-16 DIAGNOSIS — D649 Anemia, unspecified: Secondary | ICD-10-CM | POA: Diagnosis not present

## 2018-08-23 DIAGNOSIS — D508 Other iron deficiency anemias: Secondary | ICD-10-CM | POA: Diagnosis not present

## 2018-08-29 ENCOUNTER — Encounter: Payer: Self-pay | Admitting: Internal Medicine

## 2018-08-29 ENCOUNTER — Ambulatory Visit (INDEPENDENT_AMBULATORY_CARE_PROVIDER_SITE_OTHER): Payer: Medicare Other | Admitting: Internal Medicine

## 2018-08-29 VITALS — BP 140/80 | HR 87 | Ht 69.0 in | Wt 233.2 lb

## 2018-08-29 DIAGNOSIS — I10 Essential (primary) hypertension: Secondary | ICD-10-CM | POA: Diagnosis not present

## 2018-08-29 DIAGNOSIS — I5032 Chronic diastolic (congestive) heart failure: Secondary | ICD-10-CM

## 2018-08-29 DIAGNOSIS — I480 Paroxysmal atrial fibrillation: Secondary | ICD-10-CM | POA: Diagnosis not present

## 2018-08-29 DIAGNOSIS — I4892 Unspecified atrial flutter: Secondary | ICD-10-CM

## 2018-08-29 MED ORDER — RIVAROXABAN 20 MG PO TABS: 20.0000 mg | ORAL_TABLET | Freq: Every day | ORAL | 3 refills | Status: DC

## 2018-08-29 NOTE — Progress Notes (Signed)
PCP: Orpah Melter, MD Primary Cardiologist: Dr Marlou Porch Primary EP: Dr [name] Brian [name] is a 68 y.o. male who presents today for routine electrophysiology followup.  Since last being seen in our clinic, the patient reports doing reasonably well.  He has had hematuria in the setting of UTIs (most recently in November).  He also has iron deficiency anemia for which GI workup is pending.  He is worried about costs of xarelto today and has recently discontinued this medicine.  His primary concern today is with multiple orthopedic issues and arthritis.  He has had several surgeries since I saw him last.  SOB is stable, though he is not very active.  He is not aware of symptoms of AFib.  Today, he denies symptoms of palpitations, chest pain, lower extremity edema, dizziness, presyncope, or syncope.  The patient is otherwise without complaint today.   Past Medical History:  Diagnosis Date  . Anemia   . Anxiety   . Atrial flutter ([place])    a. 08/2012  . CHF (congestive heart failure) ([place])   . GERD (gastroesophageal reflux disease)   . History of gout   . History of kidney stones    passed  . Hypertension   . Obesity   . Paroxysmal atrial fibrillation (HCC)    a fib  . Pneumonia 09/2016  . Rheumatoid arthritis ([place])    "a little bit qwhere" (07/22/2017)  . Shortness of breath    with exterion from CHF  . Tobacco abuse    Past Surgical History:  Procedure Laterality Date  . A-FLUTTER ABLATION N/A 12/29/2016   Procedure: A-Flutter Ablation;  Surgeon: [name], MD;  Location: [facility];  Service: Cardiovascular;  Laterality: N/A;  . CARDIOVERSION N/A 11/05/2016   Procedure: CARDIOVERSION;  Surgeon: Sueanne Margarita, MD;  Location: [facility];  Service: Cardiovascular;  Laterality: N/A;  . IR THORACENTESIS ASP PLEURAL SPACE W/IMG GUIDE  10/01/2016  . JOINT REPLACEMENT     Right hip and left knee  . MICROLARYNGOSCOPY  09/02/2007   with excision of right vocal cord mass, Dr.  Constance Holster  . TOTAL HIP ARTHROPLASTY Right 11/17/2017  . TOTAL HIP ARTHROPLASTY Right 11/17/2017   Procedure: RIGHT TOTAL HIP ARTHROPLASTY ANTERIOR APPROACH;  Surgeon: Leandrew Koyanagi, MD;  Location: [facility];  Service: Orthopedics;  Laterality: Right;  . TOTAL HIP ARTHROPLASTY Left 03/07/2018   Procedure: LEFT TOTAL HIP ARTHROPLASTY ANTERIOR APPROACH;  Surgeon: Leandrew Koyanagi, MD;  Location: [facility];  Service: Orthopedics;  Laterality: Left;  . TOTAL KNEE ARTHROPLASTY Left 07/22/2017  . TOTAL KNEE ARTHROPLASTY Left 07/22/2017   Procedure: LEFT TOTAL KNEE ARTHROPLASTY;  Surgeon: Leandrew Koyanagi, MD;  Location: [facility];  Service: Orthopedics;  Laterality: Left;    ROS- all systems are reviewed and negatives except as per HPI above  Current Outpatient Medications  Medication Sig Dispense Refill  . acetaminophen (TYLENOL) 500 MG tablet Take 1,000 mg by mouth 3 (three) times daily as needed (Pain).    Marland Kitchen docusate sodium (COLACE) 100 MG capsule Take 200 mg by mouth 2 (two) times daily.     . famotidine (PEPCID) 20 MG tablet Take 1 tablet (20 mg total) by mouth at bedtime. 30 tablet 1  . folic acid (FOLVITE) 1 MG tablet Take 1 mg by mouth daily.  5  . furosemide (LASIX) 40 MG tablet Take 1 tablet (40 mg total) by mouth daily. Please call and schedule an appt for further refills, 1st attempt (  Patient taking differently: Take 40 mg by mouth 2 (two) times daily. Please call and schedule an appt for further refills, 1st attempt) 30 tablet 0  . HYDROcodone-acetaminophen (NORCO) 5-325 MG tablet Take 1-2 tablets by mouth daily as needed. 20 tablet 0  . HYDROcodone-acetaminophen (NORCO) 5-325 MG tablet Take 1-2 tablets by mouth daily as needed. 20 tablet 0  . KLOR-CON M20 20 MEQ tablet TAKE 1 TABLET (20 MEQ TOTAL) BY MOUTH DAILY. 90 tablet 3  . Lidocaine (ASPERCREME LIDOCAINE) 4 % PTCH Apply 2 patches topically daily. Apply 2 patches on left hip daily. Remove after 12 hours.    . methocarbamol (ROBAXIN) 750 MG tablet Take 1  tablet (750 mg total) by mouth 2 (two) times daily as needed for muscle spasms. 60 tablet 0  . methotrexate (RHEUMATREX) 2.5 MG tablet Take 20 mg by mouth every Friday. In the morning.  1  . metoprolol succinate (TOPROL-XL) 25 MG 24 hr tablet TAKE 1 TABLET (25 MG TOTAL) 2 (TWO) TIMES DAILY BY MOUTH. 60 tablet 0  . mupirocin ointment (BACTROBAN) 2 % Apply to affected area 3 times daily 5 days pre-op 22 g 0  . mupirocin ointment (BACTROBAN) 2 % Apply 1 application topically 2 (two) times daily. 22 g 0  . mupirocin ointment (BACTROBAN) 2 % Apply 1 application topically 2 (two) times daily. 22 g 3  . ondansetron (ZOFRAN) 4 MG tablet Take 1-2 tablets (4-8 mg total) by mouth every 8 (eight) hours as needed for nausea or vomiting. 40 tablet 0  . oxyCODONE (OXY IR/ROXICODONE) 5 MG immediate release tablet Take 1-3 tablets (5-15 mg total) by mouth every 4 (four) hours as needed. (Patient taking differently: Take 5-15 mg by mouth every 4 (four) hours as needed (Pain). ) 30 tablet 0  . promethazine (PHENERGAN) 25 MG tablet Take 1 tablet (25 mg total) by mouth every 6 (six) hours as needed for nausea. 30 tablet 1  . ranitidine (ZANTAC) 150 MG tablet Take 150 mg by mouth daily.    Marland Kitchen senna-docusate (SENOKOT S) 8.6-50 MG tablet Take 1 tablet by mouth at bedtime as needed. (Patient taking differently: Take 1 tablet by mouth at bedtime as needed (Constipation). ) 30 tablet 1  . sodium phosphate (FLEET) 7-19 GM/118ML ENEM Place 1 enema rectally daily as needed for severe constipation.    . sulfamethoxazole-trimethoprim (BACTRIM DS,SEPTRA DS) 800-160 MG tablet Take 1 tablet by mouth 2 (two) times daily. 28 tablet 0  . sulfamethoxazole-trimethoprim (BACTRIM DS,SEPTRA DS) 800-160 MG tablet Take 1 tablet by mouth 2 (two) times daily. 20 tablet 0  . sulfamethoxazole-trimethoprim (BACTRIM DS,SEPTRA DS) 800-160 MG tablet Take 1 tablet by mouth 2 (two) times daily. 20 tablet 0  . XARELTO 20 MG TABS tablet TAKE 1 TABLET (20 MG  TOTAL) BY MOUTH DAILY WITH SUPPER. 90 tablet 1   No current facility-administered medications for this visit.     Physical Exam: Vitals:   08/29/18 0853  BP: 140/80  Pulse: 87  SpO2: 98%  Weight: 233 lb 3.2 oz (105.8 kg)  Height: 5\' 9"  (1.753 m)    GEN- The patient is well appearing, alert and oriented x 3 today.   Head- normocephalic, atraumatic Eyes-  Sclera clear, conjunctiva pink Ears- hearing intact Oropharynx- clear Lungs- Clear to ausculation bilaterally, normal work of breathing Heart- Regular rate and rhythm, no murmurs, rubs or gallops, PMI not laterally displaced GI- soft, NT, ND, + BS Extremities- no clubbing, cyanosis, or edema  Wt Readings from Last  3 Encounters:  08/29/18 233 lb 3.2 oz (105.8 kg)  05/09/18 243 lb (110.2 kg)  04/05/18 236 lb (107 kg)    EKG tracing ordered today is personally reviewed and shows sinus rhythm with PACs, otherwise normal ekg  Assessment and Plan:  1. Paroxysmal atrial fibrillation chads2vasc score is 3. afib burden is quite low He has stopped xarelto due to concerns of bleeding (had UTI with hematuria previously).  We discussed options at length today.  Given AHA/ ACC guidelines, I have advised that he continue xarelto and proceed with GI workup.  I did offer ILR to further evaluate his afib burden.  His prior afib was in the setting of cellulitis.  If monitored with ILR and had no afib over several months, we could then consider stopping anticoagulation.  He is clear in his decision to decline this option.  He states "I have had enough stuff put in me.  I dont want that monitor at this time.".  He prefers to continue xarelto and has filled out paper work for patient assistance.  2. HTN Stable No change required today  3. Chronic diastolic dysfunction Lifestyle modification is encouraged Sodium restriction is advised  4. Atrial flutter S/p CTI ablation  Return to AF clinic every 6 months  [name] MD,  [facility] 08/29/2018 8:55 AM

## 2018-08-29 NOTE — Patient Instructions (Addendum)
Medication Instructions:  Your physician recommends that you continue on your current medications as directed. Please refer to the Current Medication list given to you today.  Restart your Xarelto 20 mg- one tablet by mouth daily with your largest meal  Do NOT take aspirin  Labwork: None ordered.  Testing/Procedures: None ordered.  Follow-Up: Your physician wants you to follow-up in: 6 months with AFIB clinic.   You will receive a reminder letter in the mail two months in advance. If you don't receive a letter, please call our office to schedule the follow-up appointment.  Any Other Special Instructions Will Be Listed Below (If Applicable).  If you need a refill on your cardiac medications before your next appointment, please call your pharmacy.

## 2018-08-30 ENCOUNTER — Ambulatory Visit (INDEPENDENT_AMBULATORY_CARE_PROVIDER_SITE_OTHER): Payer: Medicare Other

## 2018-08-30 ENCOUNTER — Ambulatory Visit (INDEPENDENT_AMBULATORY_CARE_PROVIDER_SITE_OTHER): Payer: Medicare Other | Admitting: Orthopaedic Surgery

## 2018-08-30 ENCOUNTER — Encounter (INDEPENDENT_AMBULATORY_CARE_PROVIDER_SITE_OTHER): Payer: Self-pay | Admitting: Orthopaedic Surgery

## 2018-08-30 DIAGNOSIS — Z96652 Presence of left artificial knee joint: Secondary | ICD-10-CM | POA: Diagnosis not present

## 2018-08-30 DIAGNOSIS — Z96642 Presence of left artificial hip joint: Secondary | ICD-10-CM | POA: Diagnosis not present

## 2018-08-30 MED ORDER — HYDROCODONE-ACETAMINOPHEN 5-325 MG PO TABS: 1.0000 | ORAL_TABLET | Freq: Every evening | ORAL | 0 refills | Status: DC | PRN

## 2018-08-30 NOTE — Progress Notes (Addendum)
Office Visit Note   Patient: Brian [name]           Date of Birth: [birth year]           MRN: [id number] Visit Date: 08/30/2018              Requested by: Orpah Melter, MD [address] [place], [place] [address] PCP: Orpah Melter, MD   Assessment & Plan: Visit Diagnoses:  1. Status post total replacement of left hip   2. Status post left knee replacement     Plan: Overall Harriet is doing well from his surgeries.  He will continue to work with home exercises for progressive strengthening.  I am happy with how he has recovered.  I would like to see him back in 6 months with standing AP pelvis and lateral left hip.  Left knee overall appears to be stable and I do not detect an effusion.  This is likely due to overuse.  He will adjust his activity accordingly.  Follow-Up Instructions: Return in about 6 months (around 03/02/2019).   Orders:  Orders Placed This Encounter  Procedures  . XR HIP UNILAT W OR W/O PELVIS 2-3 VIEWS LEFT  . XR KNEE 3 VIEW LEFT   Meds ordered this encounter  Medications  . HYDROcodone-acetaminophen (NORCO) 5-325 MG tablet    Sig: Take 1 tablet by mouth at bedtime as needed.    Dispense:  20 tablet    Refill:  0      Procedures: No procedures performed   Clinical Data: No additional findings.   Subjective: Chief Complaint  Patient presents with  . Left Hip - Pain, Follow-up, Routine Post Op    [place] is 6 months status post left total hip replacement.  Overall he is doing okay.  He still describes some discomfort in the lateral side of his left hip.  He is happy with his progress overall.  His left knee is doing well overall but he complains of a fullness in the back of his left knee consistent with a Baker's cyst.   Review of Systems  Constitutional: Negative.   All other systems reviewed and are negative.    Objective: Vital Signs: There were no vitals taken for this visit.  Physical Exam Vitals signs and nursing note  reviewed.  Constitutional:      Appearance: He is well-developed.  Pulmonary:     Effort: Pulmonary effort is normal.  Abdominal:     Palpations: Abdomen is soft.  Skin:    General: Skin is warm.  Neurological:     Mental Status: He is alert and oriented to person, place, and time.  Psychiatric:        Behavior: Behavior normal.        Thought Content: Thought content normal.        Judgment: Judgment normal.     Ortho Exam Left knee exam shows a trace effusion.  Surgical scar is fully healed.  Normal range of motion.  A small palpable Baker's cyst. Left hip exam shows fully healed surgical scar.  He has mildly tender to palpation of the greater trochanter.  Leg lengths are equal. Specialty Comments:  No specialty comments available.  Imaging: Xr Hip Unilat W Or W/o Pelvis 2-3 Views Left  Result Date: 08/30/2018 Stable left total hip replacement.  Xr Knee 3 View Left  Result Date: 08/30/2018 Stable left total knee replacement without complication.    PMFS History: Patient Active Problem List  Diagnosis Date Noted  . Primary osteoarthritis of left hip 03/07/2018  . History of hip replacement 03/07/2018  . 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 Cellulitis 01/22/2017  . Polyarthritis 12/01/2016  . Cellulitis of left leg 11/29/2016  . Sepsis ([place]) 11/29/2016  . Effusion, left knee 11/13/2016  . Persistent atrial fibrillation   . Dyspnea   . Positive urine drug screen   . CAP (community acquired pneumonia) 09/30/2016  . Community acquired pneumonia of left lower lobe of lung ([place])   . Pleural effusion   . Tachypnea   . CVA (cerebral infarction) 09/27/2015  . Right hand pain 09/27/2015  . History of gout 09/27/2015  . Cocaine abuse ([place]) 09/27/2015  . TIA (transient ischemic attack) 09/27/2015  . Slurred speech   . Primary osteoarthritis of right hand   . Leukocytosis   . Essential hypertension   .  Chronic diastolic CHF (congestive heart failure) ([place])   . HTN (hypertension) 11/22/2013  . Chronic diastolic heart failure ([place]) 11/22/2013  . Noncompliance 11/17/2013  . Atrial flutter with rapid ventricular response ([place]) 11/16/2013  . Acute on chronic diastolic CHF (congestive heart failure) ([place]) 09/05/2012  . Tobacco abuse   . Obesity- BMI 35   . Diastolic CHF ([place])   . Atrial flutter ([place])   . Anxiety    Past Medical History:  Diagnosis Date  . Anemia   . Anxiety   . Atrial flutter ([place])    a. 08/2012  . CHF (congestive heart failure) ([place])   . GERD (gastroesophageal reflux disease)   . History of gout   . History of kidney stones    passed  . Hypertension   . Obesity   . Paroxysmal atrial fibrillation (H[facility])    a fib  . Pneumonia 09/2016  . Rheumatoid arthritis ([place])    "a little bit qwhere" (07/22/2017)  . Shortness of breath    with exterion from CHF  . Tobacco abuse     Family History  Problem Relation Age of Onset  . Cancer Mother   . Heart disease Father   . Heart attack Father   . Cancer Father   . Diabetes Sister     Past Surgical History:  Procedure Laterality Date  . A-FLUTTER ABLATION N/A 12/29/2016   Procedure: A-Flutter Ablation;  Surgeon: [name], MD;  Location: [facility];  Service: Cardiovascular;  Laterality: N/A;  . CARDIOVERSION N/A 11/05/2016   Procedure: CARDIOVERSION;  Surgeon: Sueanne Margarita, MD;  Location: [facility];  Service: Cardiovascular;  Laterality: N/A;  . IR THORACENTESIS ASP PLEURAL SPACE W/IMG GUIDE  10/01/2016  . JOINT REPLACEMENT     Right hip and left knee  . MICROLARYNGOSCOPY  09/02/2007   with excision of right vocal cord mass, Dr. Constance Holster  . TOTAL HIP ARTHROPLASTY Right 11/17/2017  . TOTAL HIP ARTHROPLASTY Right 11/17/2017   Procedure: RIGHT TOTAL HIP ARTHROPLASTY ANTERIOR APPROACH;  Surgeon: Leandrew Koyanagi, MD;  Location: [facility];  Service: Orthopedics;  Laterality: Right;  . TOTAL HIP ARTHROPLASTY Left 03/07/2018    Procedure: LEFT TOTAL HIP ARTHROPLASTY ANTERIOR APPROACH;  Surgeon: Leandrew Koyanagi, MD;  Location: [facility];  Service: Orthopedics;  Laterality: Left;  . TOTAL KNEE ARTHROPLASTY Left 07/22/2017  . TOTAL KNEE ARTHROPLASTY Left 07/22/2017   Procedure: LEFT TOTAL KNEE ARTHROPLASTY;  Surgeon: Leandrew Koyanagi, MD;  Location: [facility];  Service: Orthopedics;  Laterality: Left;   Social  History   Occupational History  . Not on file  Tobacco Use  . Smoking status: Current Every Day Smoker    Packs/day: 0.13    Years: 45.00    Pack years: 5.85    Types: Cigarettes  . Smokeless tobacco: Never Used  . Tobacco comment: "quit off and on"  Substance and Sexual Activity  . Alcohol use: Yes    Comment: 07/22/2017 "nothing since 2016"  . Drug use: No    Comment: 07/22/2017 "none since 11/2016" cocaine (sister is not aware)  . Sexual activity: Not Currently

## 2018-09-13 ENCOUNTER — Telehealth: Payer: Self-pay | Admitting: *Deleted

## 2018-09-13 DIAGNOSIS — I4892 Unspecified atrial flutter: Secondary | ICD-10-CM | POA: Diagnosis not present

## 2018-09-13 DIAGNOSIS — M069 Rheumatoid arthritis, unspecified: Secondary | ICD-10-CM | POA: Diagnosis not present

## 2018-09-13 DIAGNOSIS — D509 Iron deficiency anemia, unspecified: Secondary | ICD-10-CM | POA: Diagnosis not present

## 2018-09-13 DIAGNOSIS — Z7901 Long term (current) use of anticoagulants: Secondary | ICD-10-CM | POA: Diagnosis not present

## 2018-09-13 NOTE — Telephone Encounter (Signed)
   Primary Cardiologist: Candee Furbish, MD  Electrophysiologist: [name], MD  Chart reviewed as part of pre-operative protocol coverage. Given past medical history and time since last visit, based on ACC/AHA guidelines, Brian [name] would be at acceptable risk for the planned procedure without further cardiovascular testing.   Pt takes Xarelto for afib with CHADS2VASc score of 5 (age, CHF, HTN, CVA). Renal function is normal. Recommend holding Xarelto for 1 day prior to procedure to due hx of CVA.  I will route this recommendation to the requesting party via Epic fax function and remove from pre-op pool.  Please call with questions.  Daune Perch, NP 09/13/2018, 5:05 PM

## 2018-09-13 NOTE — Telephone Encounter (Signed)
   [facility] Pre-operative Risk Assessment    Request for surgical clearance:  1. What type of surgery is being performed? COLONOSCOPY/ENDOSCOPY   2. When is this surgery scheduled? 10/25/18   3. What type of clearance is required (medical clearance vs. Pharmacy clearance to hold med vs. Both)? BOTH  4. Are there any medications that need to be held prior to surgery and how long?[place]   5. Practice name and name of physician performing surgery? [facility]; DR. Oletta Lamas   6. What is your office phone number [phone]    7.   What is your office fax number [phone]  8.   Anesthesia type (None, local, MAC, general) ? PROPOFOL    Julaine Hua 09/13/2018, 2:40 PM  _________________________________________________________________   (provider comments below)

## 2018-09-13 NOTE — Telephone Encounter (Signed)
Pt takes Xarelto for afib with CHADS2VASc score of 5 (age, CHF, HTN, CVA). Renal function is normal. Recommend holding Xarelto for 1 day prior to procedure to due hx of CVA.

## 2018-11-29 DIAGNOSIS — K621 Rectal polyp: Secondary | ICD-10-CM | POA: Diagnosis not present

## 2018-11-29 DIAGNOSIS — K228 Other specified diseases of esophagus: Secondary | ICD-10-CM | POA: Diagnosis not present

## 2018-11-29 DIAGNOSIS — D509 Iron deficiency anemia, unspecified: Secondary | ICD-10-CM | POA: Diagnosis not present

## 2018-11-29 DIAGNOSIS — K2930 Chronic superficial gastritis without bleeding: Secondary | ICD-10-CM | POA: Diagnosis not present

## 2018-11-29 DIAGNOSIS — Z1211 Encounter for screening for malignant neoplasm of colon: Secondary | ICD-10-CM | POA: Diagnosis not present

## 2018-11-29 DIAGNOSIS — D125 Benign neoplasm of sigmoid colon: Secondary | ICD-10-CM | POA: Diagnosis not present

## 2018-12-06 DIAGNOSIS — K228 Other specified diseases of esophagus: Secondary | ICD-10-CM | POA: Diagnosis not present

## 2018-12-06 DIAGNOSIS — K2930 Chronic superficial gastritis without bleeding: Secondary | ICD-10-CM | POA: Diagnosis not present

## 2018-12-06 DIAGNOSIS — K621 Rectal polyp: Secondary | ICD-10-CM | POA: Diagnosis not present

## 2018-12-06 DIAGNOSIS — D125 Benign neoplasm of sigmoid colon: Secondary | ICD-10-CM | POA: Diagnosis not present

## 2018-12-13 DIAGNOSIS — R5382 Chronic fatigue, unspecified: Secondary | ICD-10-CM | POA: Diagnosis not present

## 2018-12-13 DIAGNOSIS — M2550 Pain in unspecified joint: Secondary | ICD-10-CM | POA: Diagnosis not present

## 2018-12-13 DIAGNOSIS — M150 Primary generalized (osteo)arthritis: Secondary | ICD-10-CM | POA: Diagnosis not present

## 2018-12-13 DIAGNOSIS — M0579 Rheumatoid arthritis with rheumatoid factor of multiple sites without organ or systems involvement: Secondary | ICD-10-CM | POA: Diagnosis not present

## 2018-12-13 DIAGNOSIS — D508 Other iron deficiency anemias: Secondary | ICD-10-CM | POA: Diagnosis not present

## 2018-12-13 DIAGNOSIS — Z6834 Body mass index (BMI) 34.0-34.9, adult: Secondary | ICD-10-CM | POA: Diagnosis not present

## 2018-12-13 DIAGNOSIS — E669 Obesity, unspecified: Secondary | ICD-10-CM | POA: Diagnosis not present

## 2019-02-28 ENCOUNTER — Ambulatory Visit: Payer: Self-pay | Admitting: Orthopaedic Surgery

## 2019-03-07 ENCOUNTER — Ambulatory Visit (INDEPENDENT_AMBULATORY_CARE_PROVIDER_SITE_OTHER): Payer: Medicare Other

## 2019-03-07 ENCOUNTER — Ambulatory Visit (INDEPENDENT_AMBULATORY_CARE_PROVIDER_SITE_OTHER): Payer: Medicare Other | Admitting: Orthopaedic Surgery

## 2019-03-07 ENCOUNTER — Encounter: Payer: Self-pay | Admitting: Orthopaedic Surgery

## 2019-03-07 ENCOUNTER — Other Ambulatory Visit: Payer: Self-pay

## 2019-03-07 DIAGNOSIS — Z96642 Presence of left artificial hip joint: Secondary | ICD-10-CM | POA: Diagnosis not present

## 2019-03-07 NOTE — Progress Notes (Signed)
Post-Op Visit Note   Patient: Brian [name]           Date of Birth: 1951/04/26           MRN: [id number] Visit Date: 03/07/2019 PCP: Orpah Melter, MD   Assessment & Plan:  Chief Complaint:  Chief Complaint  Patient presents with  . Left Hip - Follow-up   Visit Diagnoses:  1. Status post total replacement of left hip     Plan: Patient is a pleasant 68 year old gentleman who presents our clinic today 1 year status post left anterior total hip replacement 03/07/2018.  He has been doing well.  He still exhibits some weakness but overall has been progressing.  He does occasionally have a pain to the lateral aspect of his hip but this improves with stretching.  Examination of left hip reveals negative logroll.  Near full range of motion strength.  He is neurovascular intact distally.  Today, dental prophylaxis reinforced.  He will continue to increase activity as tolerated.  Follow-up with [place] in 1 years time for follow-up of bilateral hip replacements with standing AP pelvis lateral hip x-rays.  Will call with concerns or questions in the meantime.  Follow-Up Instructions: Return in about 1 year (around 03/06/2020).   Orders:  Orders Placed This Encounter  Procedures  . XR HIP UNILAT W OR W/O PELVIS 2-3 VIEWS LEFT   No orders of the defined types were placed in this encounter.   Imaging: Xr Hip Unilat W Or W/o Pelvis 2-3 Views Left  Result Date: 03/07/2019 Well-seated prosthesis and stable cables without complication   PMFS History: Patient Active Problem List   Diagnosis Date Noted  . Primary osteoarthritis of left hip 03/07/2018  . History of hip replacement 03/07/2018  . 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 Cellulitis 01/22/2017  . Polyarthritis 12/01/2016  . Cellulitis of left leg 11/29/2016  . Sepsis ([place]) 11/29/2016  . Effusion, left knee 11/13/2016  . Persistent atrial fibrillation   . Dyspnea   .  Positive urine drug screen   . CAP (community acquired pneumonia) 09/30/2016  . Community acquired pneumonia of left lower lobe of lung ([place])   . Pleural effusion   . Tachypnea   . CVA (cerebral infarction) 09/27/2015  . Right hand pain 09/27/2015  . History of gout 09/27/2015  . Cocaine abuse ([place]) 09/27/2015  . TIA (transient ischemic attack) 09/27/2015  . Slurred speech   . Primary osteoarthritis of right hand   . Leukocytosis   . Essential hypertension   . Chronic diastolic CHF (congestive heart failure) ([place])   . HTN (hypertension) 11/22/2013  . Chronic diastolic heart failure ([place]) 11/22/2013  . Noncompliance 11/17/2013  . Atrial flutter with rapid ventricular response ([place]) 11/16/2013  . Acute on chronic diastolic CHF (congestive heart failure) ([place]) 09/05/2012  . Tobacco abuse   . Obesity- BMI 35   . Diastolic CHF ([place])   . Atrial flutter ([place])   . Anxiety    Past Medical History:  Diagnosis Date  . Anemia   . Anxiety   . Atrial flutter ([place])    a. 08/2012  . CHF (congestive heart failure) ([place])   . GERD (gastroesophageal reflux disease)   . History of gout   . History of kidney stones    passed  . Hypertension   . Obesity   . Paroxysmal atrial fibrillation (HCC)    a fib  . Pneumonia  09/2016  . Rheumatoid arthritis ([place])    "a little bit qwhere" (07/22/2017)  . Shortness of breath    with exterion from CHF  . Tobacco abuse     Family History  Problem Relation Age of Onset  . Cancer Mother   . Heart disease Father   . Heart attack Father   . Cancer Father   . Diabetes Sister     Past Surgical History:  Procedure Laterality Date  . A-FLUTTER ABLATION N/A 12/29/2016   Procedure: A-Flutter Ablation;  Surgeon: [name], MD;  Location: [facility];  Service: Cardiovascular;  Laterality: N/A;  . CARDIOVERSION N/A 11/05/2016   Procedure: CARDIOVERSION;  Surgeon: Sueanne Margarita, MD;  Location: [facility];  Service: Cardiovascular;  Laterality:  N/A;  . IR THORACENTESIS ASP PLEURAL SPACE W/IMG GUIDE  10/01/2016  . JOINT REPLACEMENT     Right hip and left knee  . MICROLARYNGOSCOPY  09/02/2007   with excision of right vocal cord mass, Dr. Constance Holster  . TOTAL HIP ARTHROPLASTY Right 11/17/2017  . TOTAL HIP ARTHROPLASTY Right 11/17/2017   Procedure: RIGHT TOTAL HIP ARTHROPLASTY ANTERIOR APPROACH;  Surgeon: Leandrew Koyanagi, MD;  Location: [facility];  Service: Orthopedics;  Laterality: Right;  . TOTAL HIP ARTHROPLASTY Left 03/07/2018   Procedure: LEFT TOTAL HIP ARTHROPLASTY ANTERIOR APPROACH;  Surgeon: Leandrew Koyanagi, MD;  Location: [facility];  Service: Orthopedics;  Laterality: Left;  . TOTAL KNEE ARTHROPLASTY Left 07/22/2017  . TOTAL KNEE ARTHROPLASTY Left 07/22/2017   Procedure: LEFT TOTAL KNEE ARTHROPLASTY;  Surgeon: Leandrew Koyanagi, MD;  Location: [facility];  Service: Orthopedics;  Laterality: Left;   Social History   Occupational History  . Not on file  Tobacco Use  . Smoking status: Current Every Day Smoker    Packs/day: 0.13    Years: 45.00    Pack years: 5.85    Types: Cigarettes  . Smokeless tobacco: Never Used  . Tobacco comment: "quit off and on"  Substance and Sexual Activity  . Alcohol use: Yes    Comment: 07/22/2017 "nothing since 2016"  . Drug use: No    Comment: 07/22/2017 "none since 11/2016" cocaine (sister is not aware)  . Sexual activity: Not Currently

## 2019-03-15 DIAGNOSIS — E669 Obesity, unspecified: Secondary | ICD-10-CM | POA: Diagnosis not present

## 2019-03-15 DIAGNOSIS — R5382 Chronic fatigue, unspecified: Secondary | ICD-10-CM | POA: Diagnosis not present

## 2019-03-15 DIAGNOSIS — M2550 Pain in unspecified joint: Secondary | ICD-10-CM | POA: Diagnosis not present

## 2019-03-15 DIAGNOSIS — D508 Other iron deficiency anemias: Secondary | ICD-10-CM | POA: Diagnosis not present

## 2019-03-15 DIAGNOSIS — Z6835 Body mass index (BMI) 35.0-35.9, adult: Secondary | ICD-10-CM | POA: Diagnosis not present

## 2019-03-15 DIAGNOSIS — M150 Primary generalized (osteo)arthritis: Secondary | ICD-10-CM | POA: Diagnosis not present

## 2019-03-15 DIAGNOSIS — M0579 Rheumatoid arthritis with rheumatoid factor of multiple sites without organ or systems involvement: Secondary | ICD-10-CM | POA: Diagnosis not present

## 2019-03-15 DIAGNOSIS — Z9229 Personal history of other drug therapy: Secondary | ICD-10-CM | POA: Diagnosis not present

## 2019-03-25 IMAGING — CT CT ANGIO CHEST
2 of 8 series · 18 of 46 positions shown · IV contrast (OMNI)
Comparison: 09/30/2016 CXR, 09/27/2015 CXR, 04/17/2014 chest CT

CLINICAL DATA: Diagnosed with pneumonia [REDACTED] but has not been
taking blood thinners x8 days. Current smoker. Pleural effusion.
Congestive heart failure

EXAM:
CT ANGIOGRAPHY CHEST WITH CONTRAST
TECHNIQUE: Multidetector CT imaging of the chest was performed using the
standard protocol during bolus administration of intravenous
contrast. Multiplanar CT image reconstructions and MIPs were
obtained to evaluate the vascular anatomy.
CONTRAST:  100 cc Isovue 370 IV

[Series 6: thins · axial · 0.74mm/px · z∈[-616,-336]mm · 15 of 310 slices shown]
[im 15/310  lung]
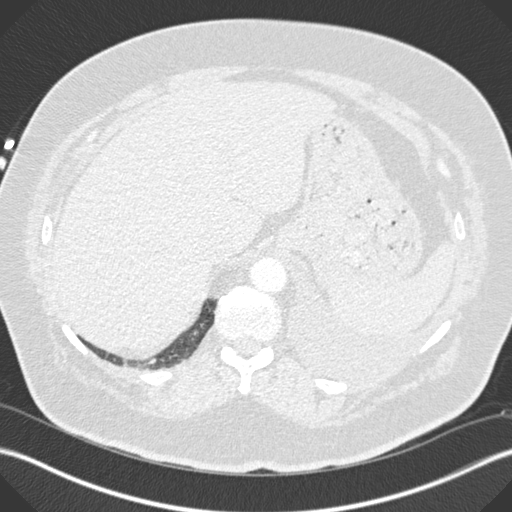
[im 43/310  soft-tissue]
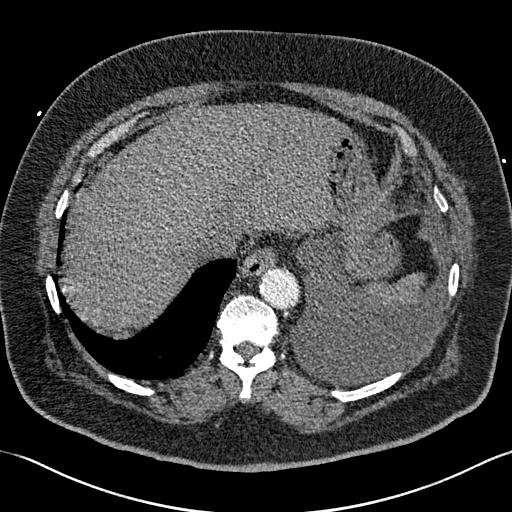
[im 57/310  lung]
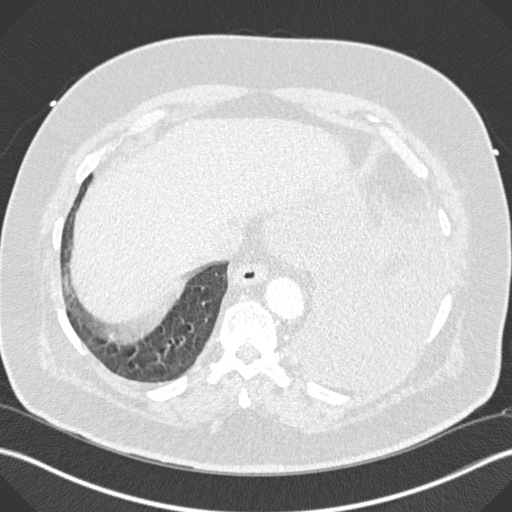
[im 71/310  soft-tissue]
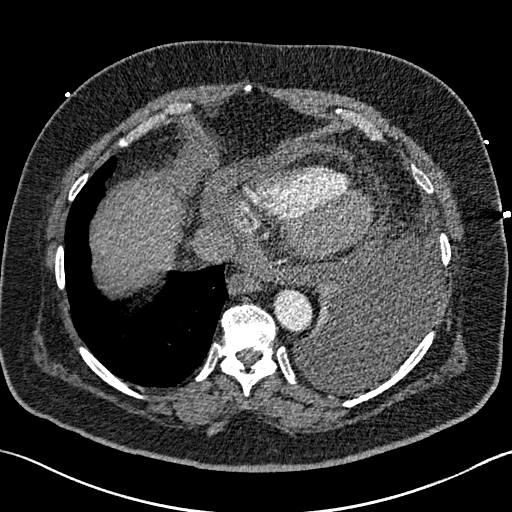
[im 99/310  lung]
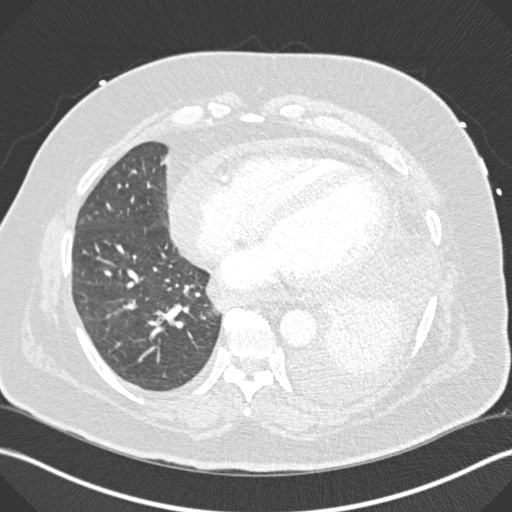
[im 113/310  soft-tissue]
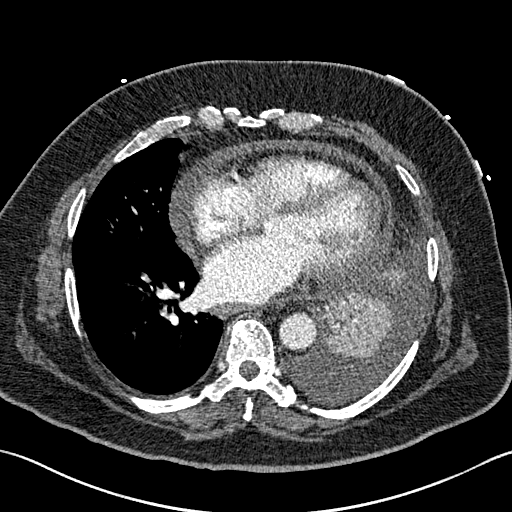
[im 141/310  lung]
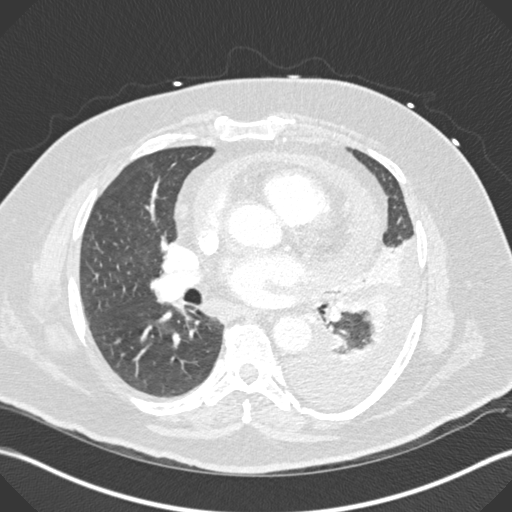
[im 155/310  soft-tissue]
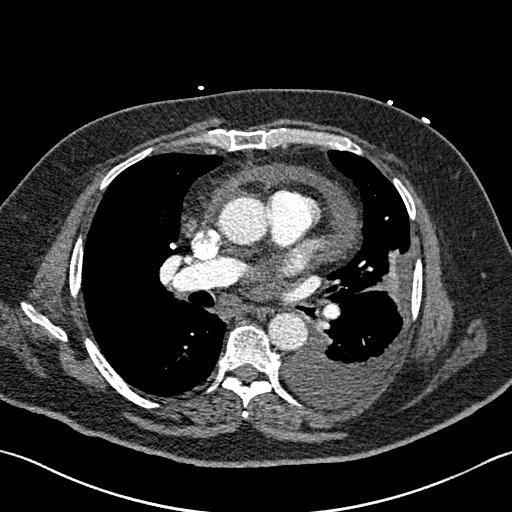
[im 169/310  lung]
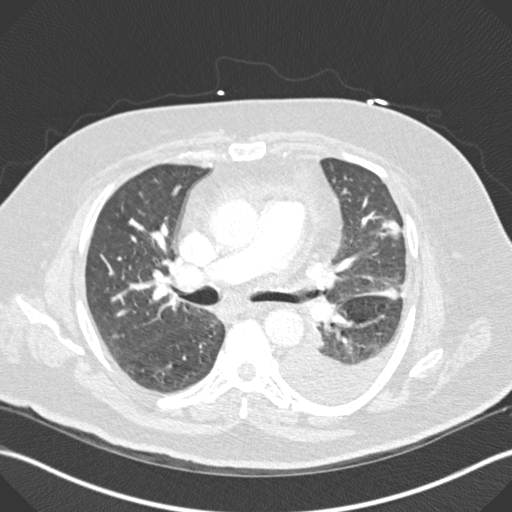
[im 197/310  soft-tissue]
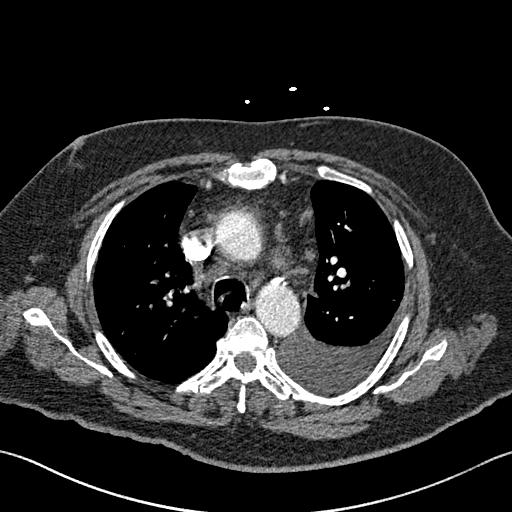
[im 211/310  lung]
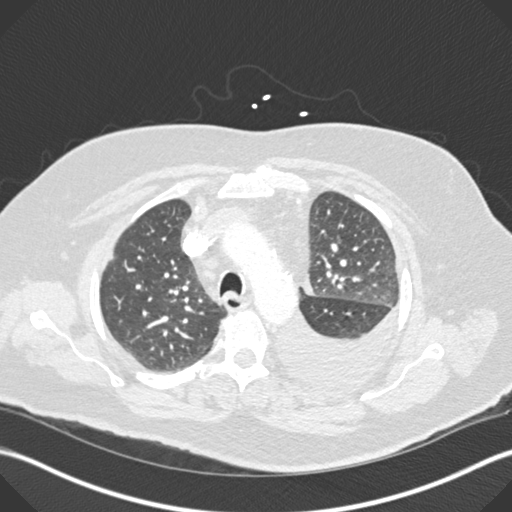
[im 239/310  soft-tissue]
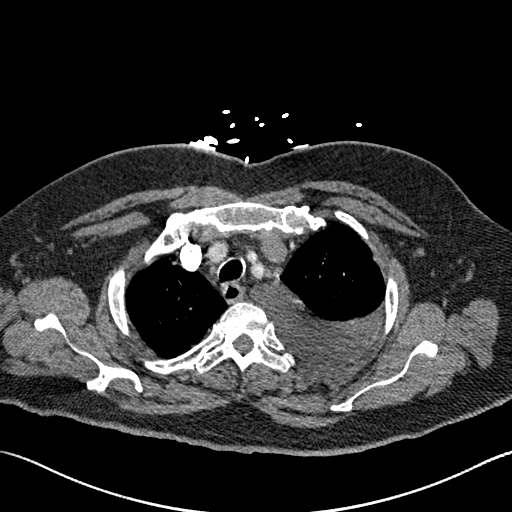
[im 253/310  lung]
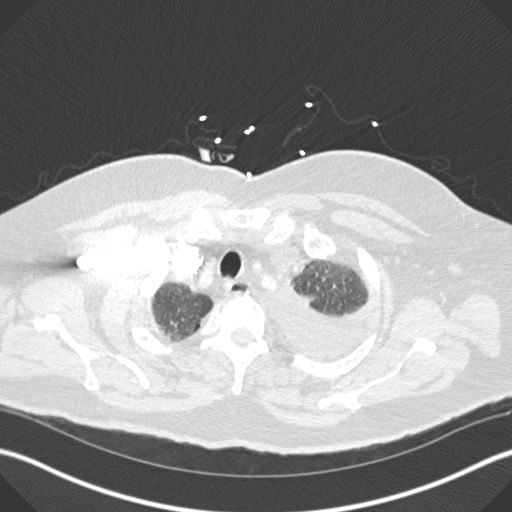
[im 267/310  soft-tissue]
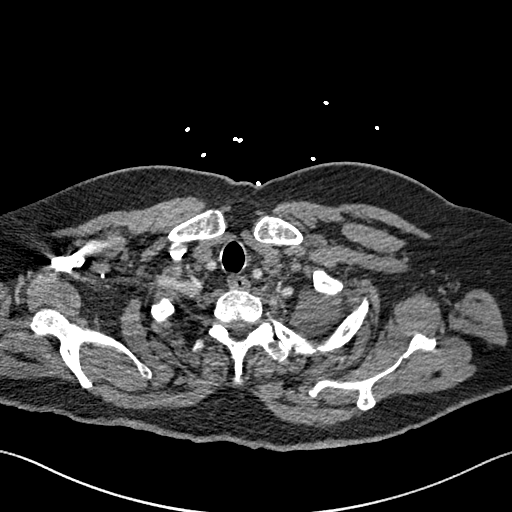
[im 295/310  lung]
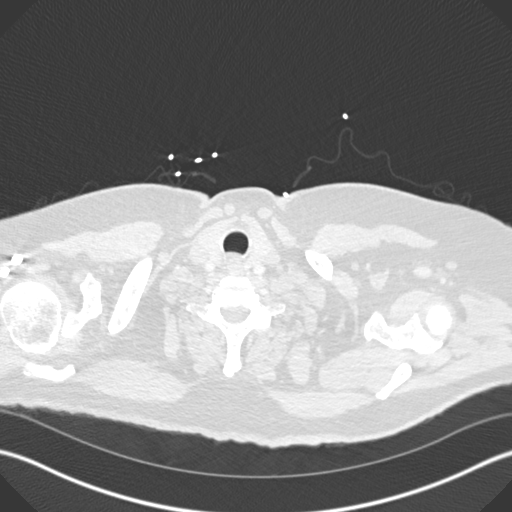

[Series 8: coronal mpr · coronal · 0.61mm/px · 3 of 166 slices shown]
[im 42/166  soft-tissue]
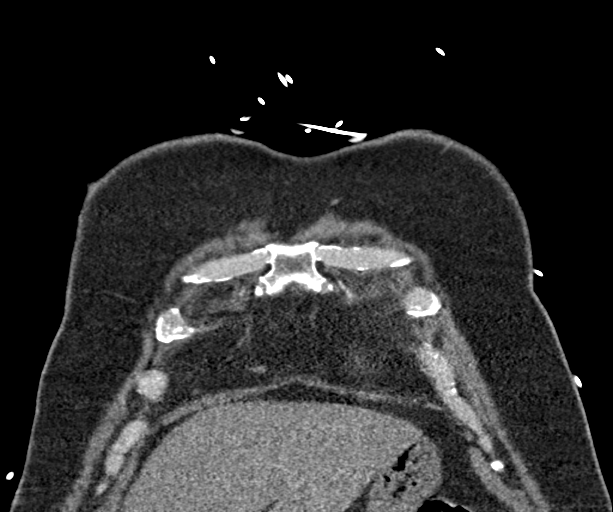
[im 83/166  soft-tissue]
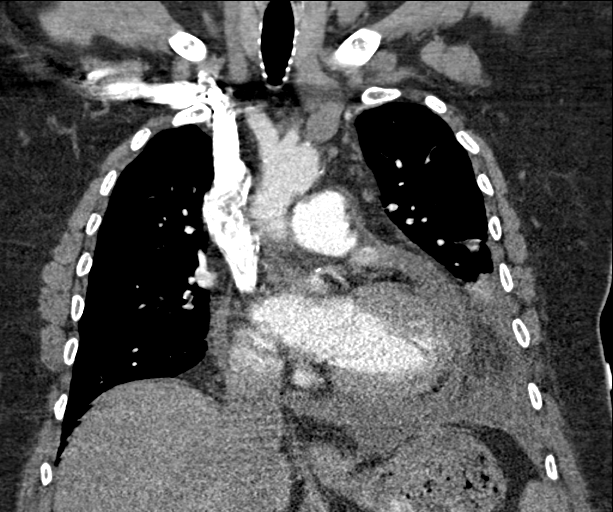
[im 124/166  soft-tissue]
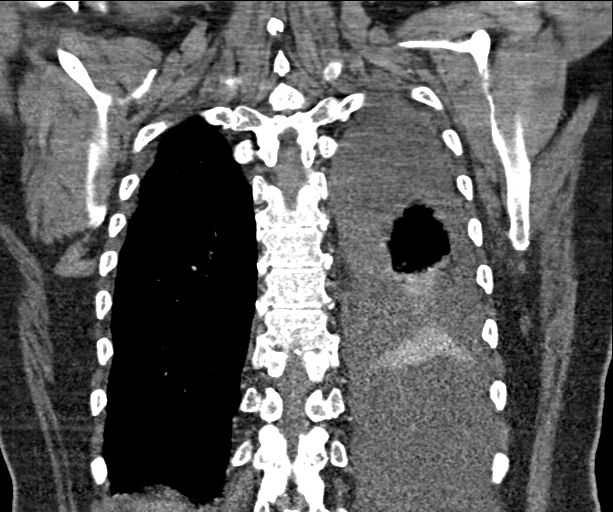

[18 of 46 positions shown; findings below may reference images not displayed]

FINDINGS: Cardiovascular: Normal size cardiac chambers with small
circumferential pericardial effusion measuring between 1.6 and
cm in thickness cm. No aortic aneurysm or dissection. Aortic
atherosclerosis at the arch. No large central pulmonary embolus.

Mediastinum/Nodes: Mildly enlarged, potentially reactive mediastinal
lymph nodes in the subcarinal, pretracheal and prevascular portions
of the mediastinum, the largest approximately 13 mm in the
prevascular space. Thyroid gland, trachea, and esophagus demonstrate
no significant findings.

Lungs/Pleura: Moderate left effusion extending to the lung apex with
adjacent compressive atelectasis and probable lingular atelectasis
or scarring. No dominant mass is identified. No pneumonic
consolidation.

Upper Abdomen: No acute abnormality.

Musculoskeletal: Healed fracture deformity of the upper sternum.
Minor degenerative changes along the dorsal spine. No acute nor
suspicious osseous abnormality.

Review of the MIP images confirms the above findings.
IMPRESSION: 1. No acute pulmonary embolus.
2. Small circumferential pericardial effusion measuring up to 1.8 cm
thickness.
3. Moderate left effusion with adjacent compressive atelectasis.
4. Degenerative changes along the dorsal spine. Healed fracture
deformity of the upper sternum.

## 2019-03-26 IMAGING — DX DG CHEST 1V
1 series · 1 of 1 positions shown · non-contrast
Comparison: 10/01/2016

CLINICAL DATA: Status post thoracentesis.

EXAM:
CHEST 1 VIEW

[chest pa]
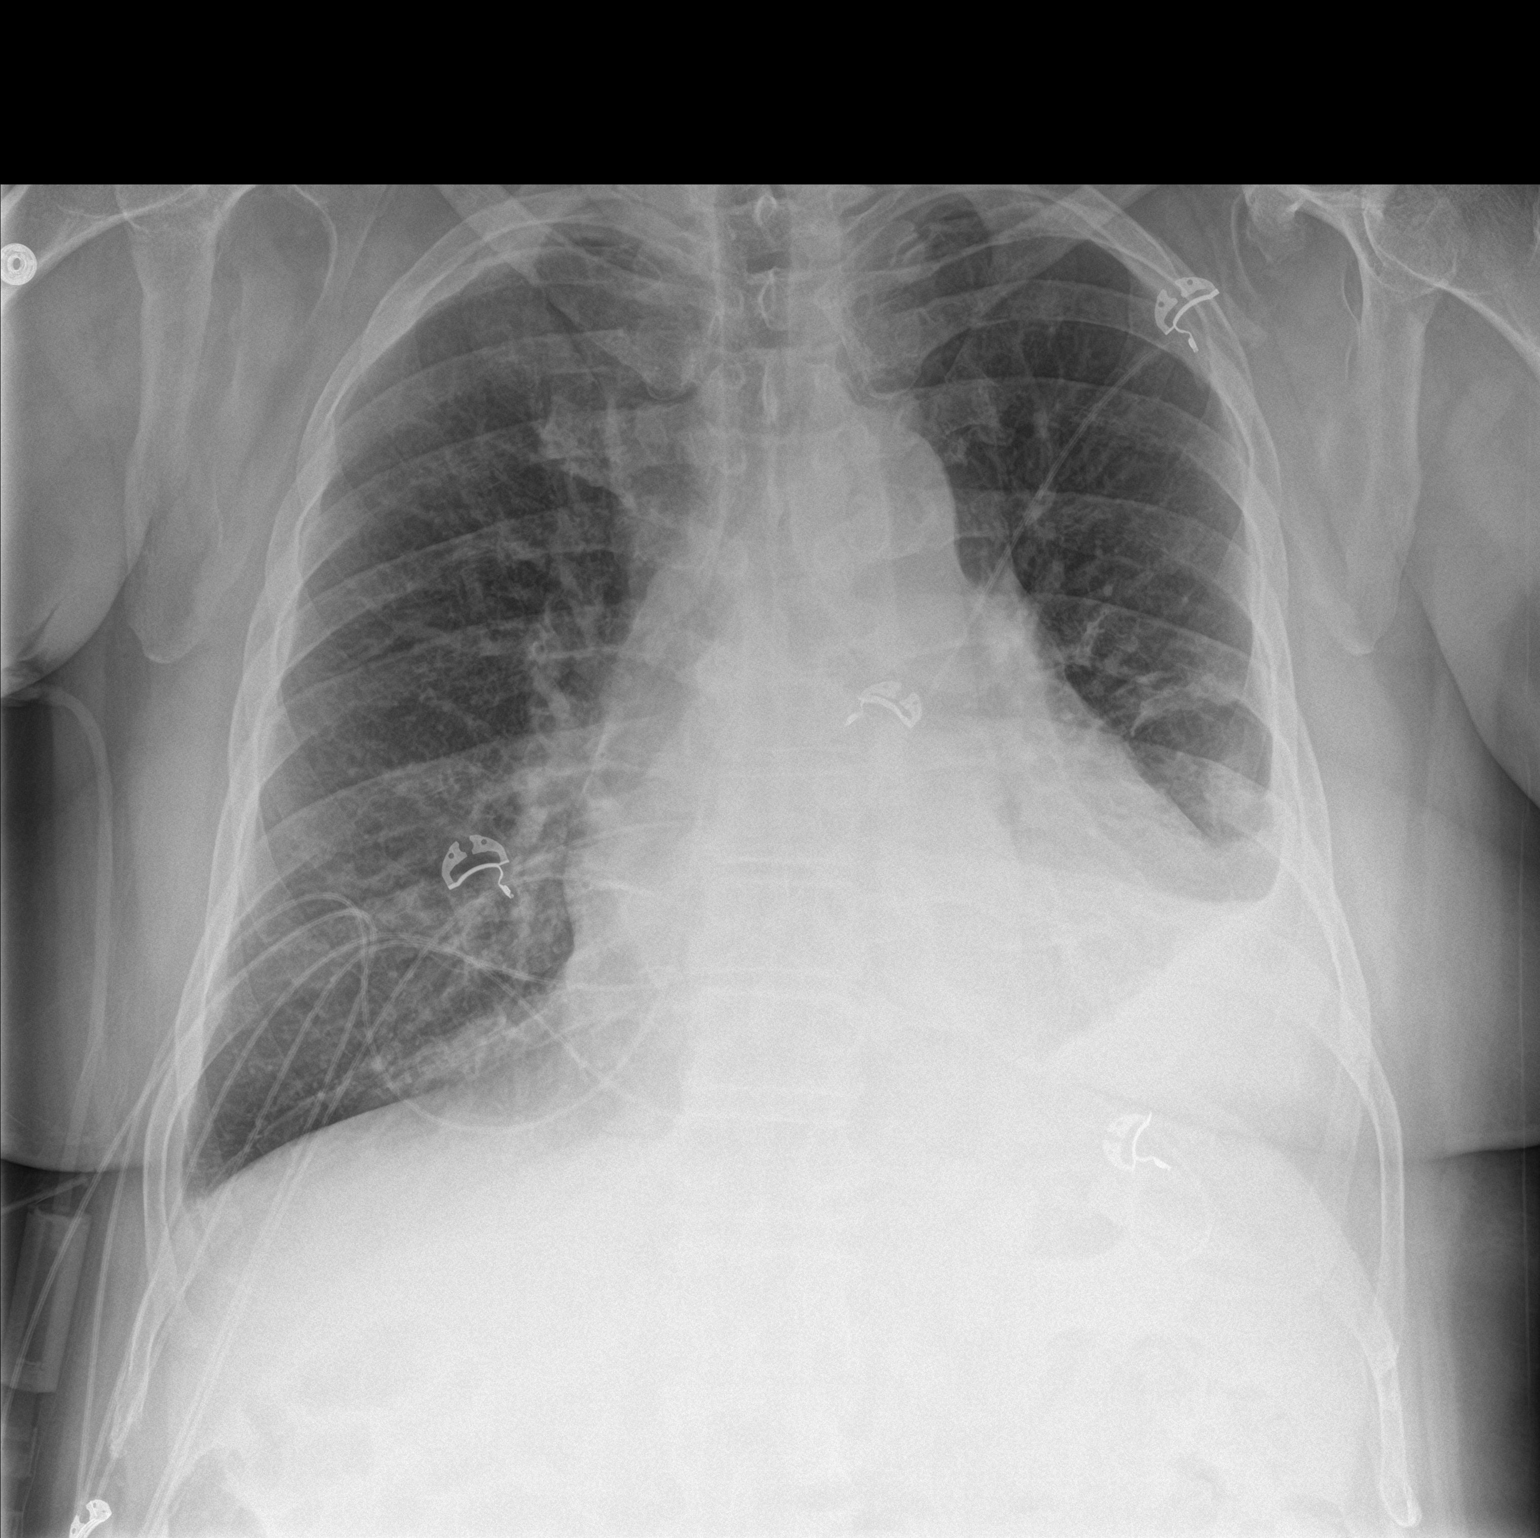

[1 of 1 positions shown; findings below may reference images not displayed]

FINDINGS: Left-sided pleural effusion is significantly smaller. There is left
basilar opacity obscuring the left hemidiaphragm consistent with
atelectasis or residual consolidation. There is no pneumothorax
following thoracentesis. Cardiomegaly. No pulmonary edema.
IMPRESSION: Significantly improved left-sided pleural effusion. No pneumothorax.

## 2019-03-26 IMAGING — US IR THORACENTESIS ASP PLEURAL SPACE W/IMG GUIDE
1 series · 2 of 2 positions shown · non-contrast
Comparison: none

INDICATION: Patient admitted with pneumonia now with left pleural effusion.
Request is made for diagnostic and therapeutic left thoracentesis.

[Series 1: ir (id) (id)/(id)/(id) ir · 2 of 2 slices shown]
[im 1/2]
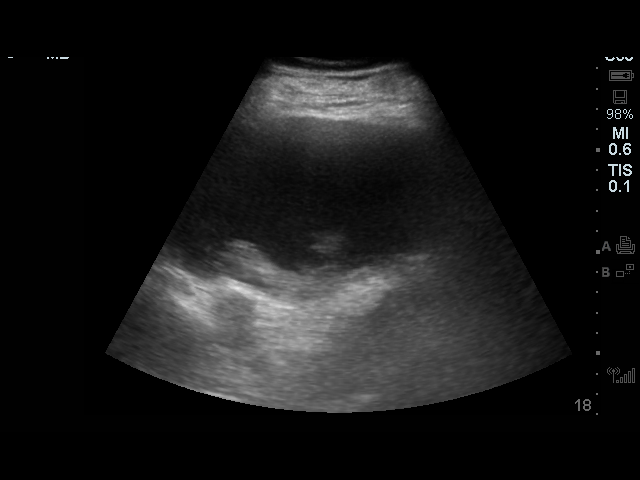
[im 2/2]
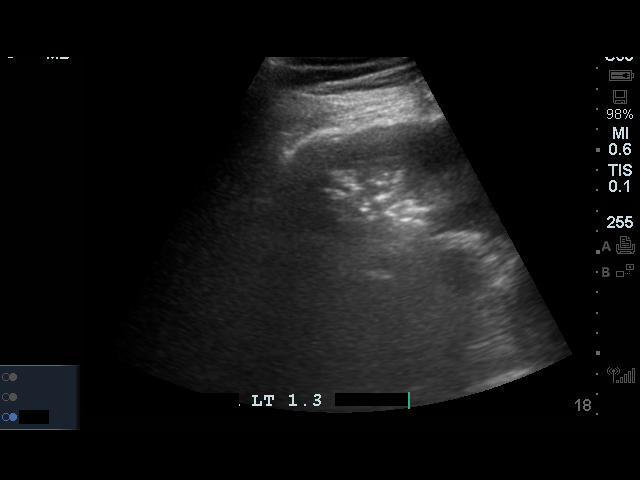

[2 of 2 positions shown; findings below may reference images not displayed]

EXAM:
ULTRASOUND GUIDED DIAGNOSTIC AND THERAPEUTIC LEFT THORACENTESIS

MEDICATIONS:
10 mL 1% lidocaine

COMPLICATIONS:
None immediate.

PROCEDURE:
An ultrasound guided thoracentesis was thoroughly discussed with the
patient and questions answered. The benefits, risks, alternatives
and complications were also discussed. The patient understands and
wishes to proceed with the procedure. Written consent was obtained.

Ultrasound was performed to localize and mark an adequate pocket of
fluid in the left chest. The area was then prepped and draped in the
normal sterile fashion. 1% Lidocaine was used for local anesthesia.
Under ultrasound guidance a Safe-T-centesis catheter was introduced.
Thoracentesis was performed. The catheter was removed and a dressing
applied.
FINDINGS: A total of approximately 1.3 liters of blood-tinged fluid was
removed. Samples were sent to the laboratory as requested by the
clinical team.
IMPRESSION: Successful ultrasound guided diagnostic and therapeutic left
thoracentesis yielding 1.3 liters of pleural fluid. Procedure was
stopped prior to removal of all fluid due to patient discomfort.

## 2019-03-27 IMAGING — DX DG CHEST 2V
2 series · 2 of 2 positions shown · non-contrast
Comparison: 10/01/2016

CLINICAL DATA: Dyspnea

EXAM:
CHEST  2 VIEW

[chest pa]
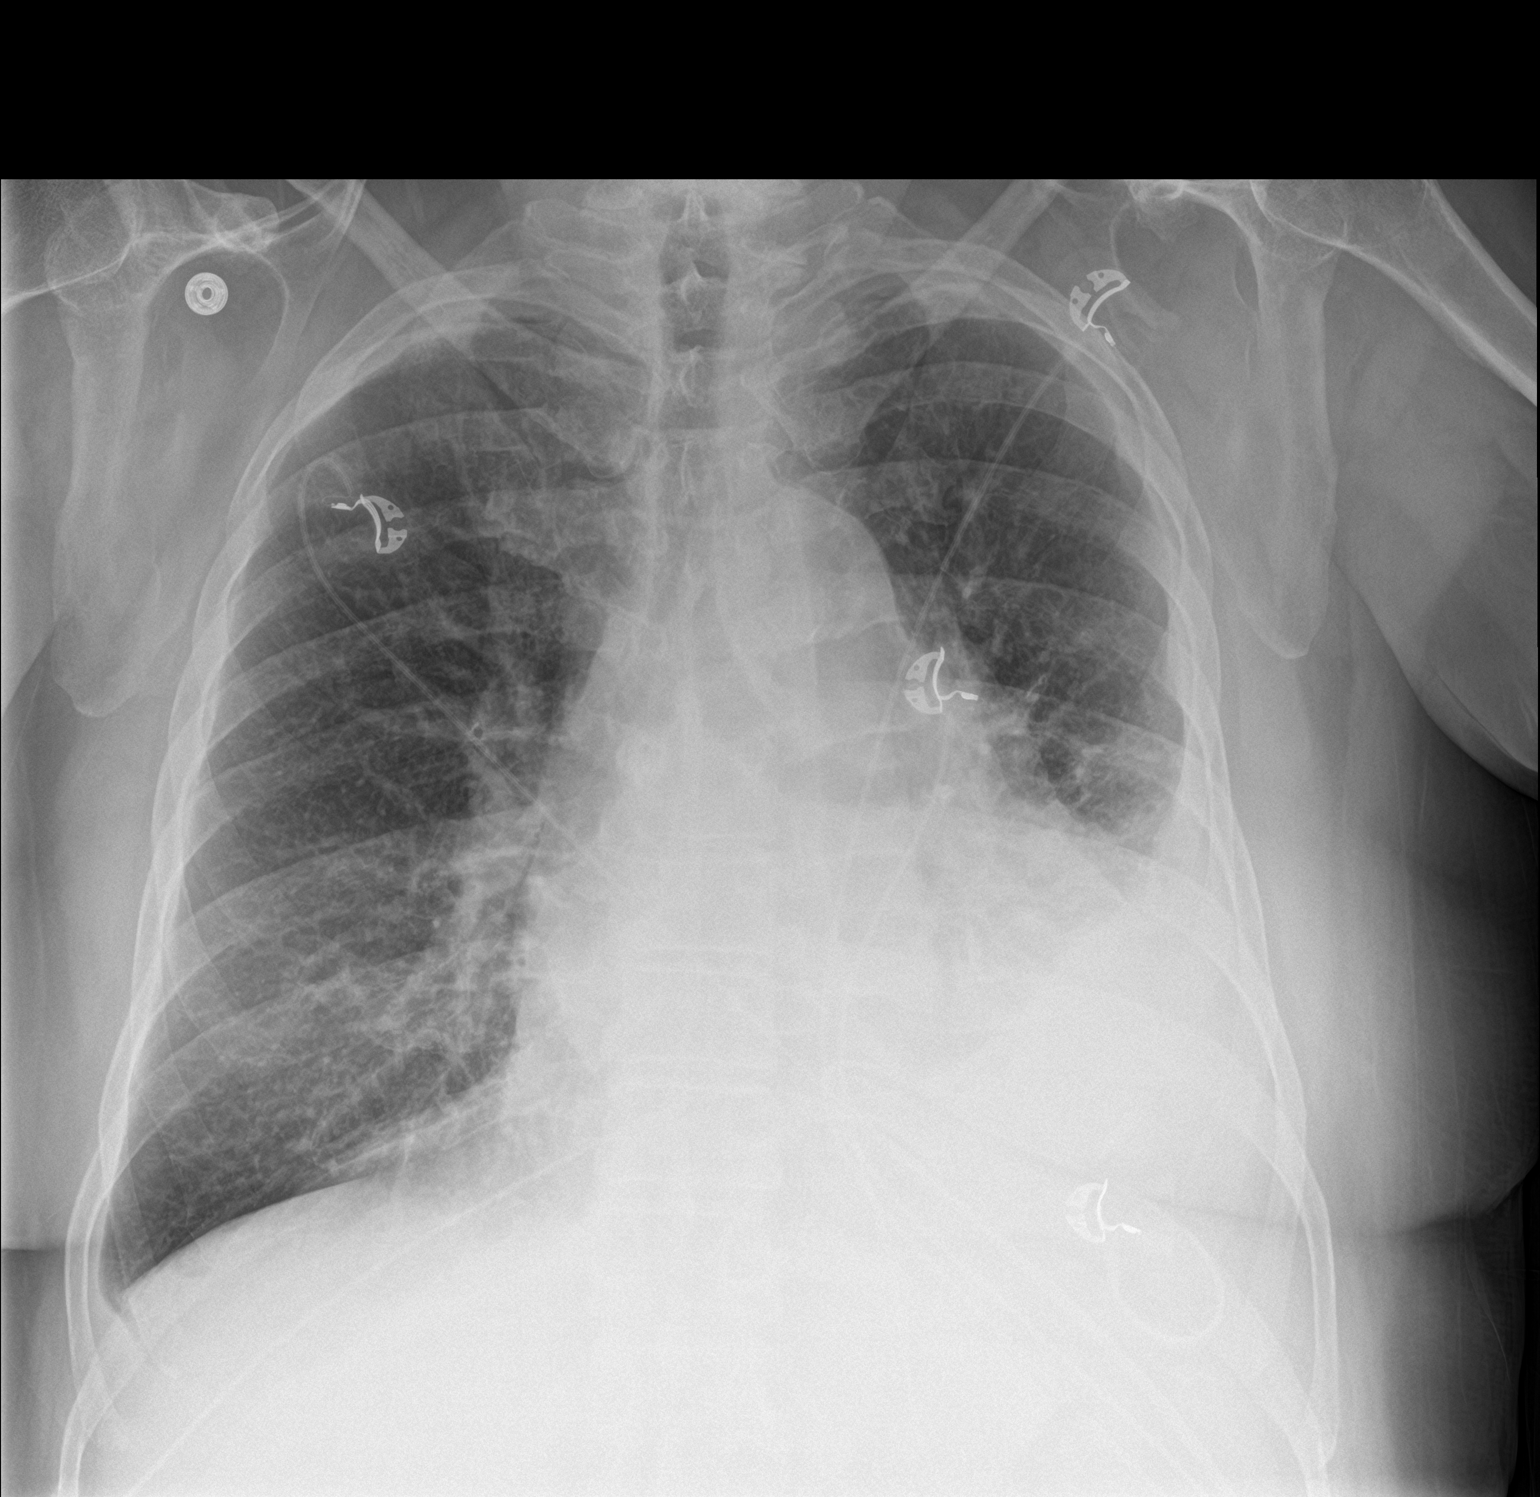

[chest lat]
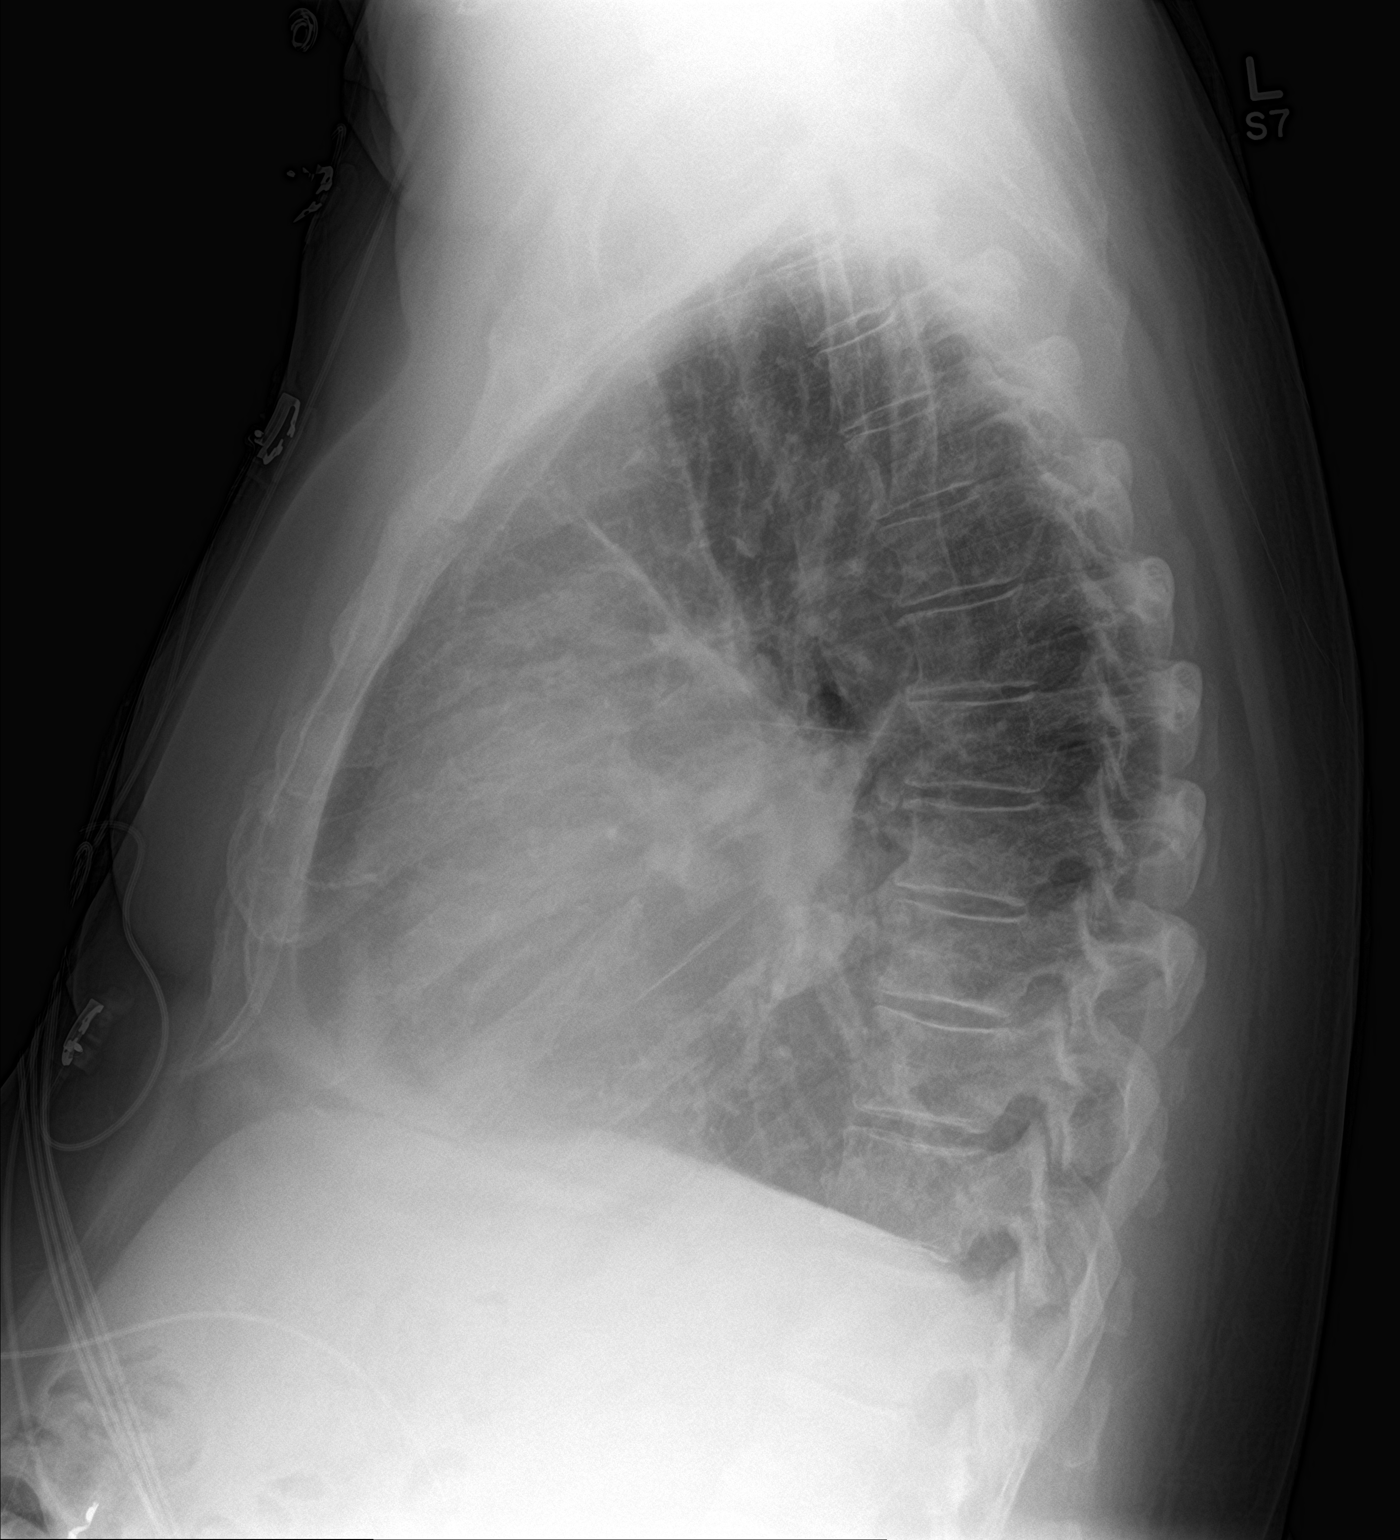

[2 of 2 positions shown; findings below may reference images not displayed]

FINDINGS: Mild moderate left pleural effusion unchanged. Left lower lobe
airspace disease also unchanged. Minimal airspace disease in the
right lung with mild right pleural effusion. Negative for heart
failure or edema.
IMPRESSION: Left pleural effusion and left lower lobe airspace disease
unchanged. Minimal right lower lobe airspace disease and small right
effusion also unchanged.

## 2019-04-10 ENCOUNTER — Other Ambulatory Visit: Payer: Self-pay | Admitting: Cardiology

## 2019-04-11 DIAGNOSIS — Z23 Encounter for immunization: Secondary | ICD-10-CM | POA: Diagnosis not present

## 2019-04-24 ENCOUNTER — Other Ambulatory Visit: Payer: Self-pay | Admitting: Internal Medicine

## 2019-04-24 MED ORDER — FUROSEMIDE 40 MG PO TABS: 80.0000 mg | ORAL_TABLET | Freq: Every day | ORAL | 1 refills | Status: DC

## 2019-04-24 NOTE — Telephone Encounter (Signed)
Pt's medication was sent to pt's pharmacy as requested. Confirmation received.  °

## 2019-07-05 ENCOUNTER — Other Ambulatory Visit: Payer: Self-pay | Admitting: Cardiology

## 2019-07-14 DIAGNOSIS — M0579 Rheumatoid arthritis with rheumatoid factor of multiple sites without organ or systems involvement: Secondary | ICD-10-CM | POA: Diagnosis not present

## 2019-07-14 DIAGNOSIS — R5382 Chronic fatigue, unspecified: Secondary | ICD-10-CM | POA: Diagnosis not present

## 2019-08-02 DIAGNOSIS — E669 Obesity, unspecified: Secondary | ICD-10-CM | POA: Diagnosis not present

## 2019-08-02 DIAGNOSIS — M150 Primary generalized (osteo)arthritis: Secondary | ICD-10-CM | POA: Diagnosis not present

## 2019-08-02 DIAGNOSIS — Z6836 Body mass index (BMI) 36.0-36.9, adult: Secondary | ICD-10-CM | POA: Diagnosis not present

## 2019-08-02 DIAGNOSIS — M2550 Pain in unspecified joint: Secondary | ICD-10-CM | POA: Diagnosis not present

## 2019-08-02 DIAGNOSIS — M0579 Rheumatoid arthritis with rheumatoid factor of multiple sites without organ or systems involvement: Secondary | ICD-10-CM | POA: Diagnosis not present

## 2019-08-02 DIAGNOSIS — D508 Other iron deficiency anemias: Secondary | ICD-10-CM | POA: Diagnosis not present

## 2019-08-02 DIAGNOSIS — R5382 Chronic fatigue, unspecified: Secondary | ICD-10-CM | POA: Diagnosis not present

## 2019-08-04 ENCOUNTER — Other Ambulatory Visit: Payer: Self-pay | Admitting: Cardiology

## 2019-08-04 ENCOUNTER — Other Ambulatory Visit: Payer: Self-pay | Admitting: Internal Medicine

## 2019-08-29 ENCOUNTER — Other Ambulatory Visit: Payer: Self-pay | Admitting: Cardiology

## 2019-09-05 ENCOUNTER — Other Ambulatory Visit: Payer: Self-pay | Admitting: Cardiology

## 2019-09-26 NOTE — Telephone Encounter (Signed)
disregard

## 2019-10-02 DIAGNOSIS — M0579 Rheumatoid arthritis with rheumatoid factor of multiple sites without organ or systems involvement: Secondary | ICD-10-CM | POA: Diagnosis not present

## 2019-10-04 ENCOUNTER — Other Ambulatory Visit: Payer: Self-pay | Admitting: Cardiology

## 2019-10-09 ENCOUNTER — Other Ambulatory Visit: Payer: Self-pay | Admitting: Cardiology

## 2019-10-09 ENCOUNTER — Other Ambulatory Visit: Payer: Self-pay | Admitting: Internal Medicine

## 2019-10-23 DIAGNOSIS — H2513 Age-related nuclear cataract, bilateral: Secondary | ICD-10-CM | POA: Diagnosis not present

## 2019-10-25 ENCOUNTER — Other Ambulatory Visit: Payer: Self-pay | Admitting: Cardiology

## 2019-10-25 DIAGNOSIS — R300 Dysuria: Secondary | ICD-10-CM | POA: Diagnosis not present

## 2019-11-05 ENCOUNTER — Other Ambulatory Visit: Payer: Self-pay | Admitting: Cardiology

## 2019-11-14 ENCOUNTER — Other Ambulatory Visit: Payer: Self-pay | Admitting: Internal Medicine

## 2019-11-14 ENCOUNTER — Other Ambulatory Visit: Payer: Self-pay | Admitting: Cardiology

## 2019-11-14 NOTE — Telephone Encounter (Signed)
Outpatient Medication Detail   Disp Refills Start End   KLOR-CON M20 20 MEQ tablet 15 tablet 0 11/06/2019    Sig: TAKE 1 TABLET BY MOUTH DAILY. PLEASE SCHEDULE ANNUAL APPT WITH DR. Marlou Porch FOR REFILLS. [phone]   Sent to pharmacy as: KLOR-CON M20 20 MEQ tablet   Notes to Pharmacy: Please call our to schedule an appointment for any future refills Thank you. [phone]. 3rd and final attempt   E-Prescribing Status: Receipt confirmed by pharmacy (11/06/2019  5:05 PM EDT)   Pharmacy  [id number] - [place], [place] - [address]

## 2019-11-24 DIAGNOSIS — R300 Dysuria: Secondary | ICD-10-CM | POA: Diagnosis not present

## 2019-12-06 DIAGNOSIS — N400 Enlarged prostate without lower urinary tract symptoms: Secondary | ICD-10-CM | POA: Diagnosis not present

## 2019-12-06 DIAGNOSIS — R300 Dysuria: Secondary | ICD-10-CM | POA: Diagnosis not present

## 2019-12-06 DIAGNOSIS — N419 Inflammatory disease of prostate, unspecified: Secondary | ICD-10-CM | POA: Diagnosis not present

## 2019-12-06 DIAGNOSIS — K6289 Other specified diseases of anus and rectum: Secondary | ICD-10-CM | POA: Diagnosis not present

## 2019-12-15 ENCOUNTER — Other Ambulatory Visit: Payer: Self-pay | Admitting: Internal Medicine

## 2019-12-22 DIAGNOSIS — R972 Elevated prostate specific antigen [PSA]: Secondary | ICD-10-CM | POA: Diagnosis not present

## 2019-12-22 DIAGNOSIS — R310 Gross hematuria: Secondary | ICD-10-CM | POA: Diagnosis not present

## 2019-12-23 ENCOUNTER — Other Ambulatory Visit: Payer: Self-pay | Admitting: Internal Medicine

## 2020-01-05 ENCOUNTER — Other Ambulatory Visit: Payer: Self-pay | Admitting: Internal Medicine

## 2020-01-15 DIAGNOSIS — Z23 Encounter for immunization: Secondary | ICD-10-CM | POA: Diagnosis not present

## 2020-02-05 DIAGNOSIS — N401 Enlarged prostate with lower urinary tract symptoms: Secondary | ICD-10-CM | POA: Diagnosis not present

## 2020-02-05 DIAGNOSIS — D414 Neoplasm of uncertain behavior of bladder: Secondary | ICD-10-CM | POA: Diagnosis not present

## 2020-02-05 DIAGNOSIS — R972 Elevated prostate specific antigen [PSA]: Secondary | ICD-10-CM | POA: Diagnosis not present

## 2020-02-05 DIAGNOSIS — R310 Gross hematuria: Secondary | ICD-10-CM | POA: Diagnosis not present

## 2020-02-05 DIAGNOSIS — R3915 Urgency of urination: Secondary | ICD-10-CM | POA: Diagnosis not present

## 2020-02-07 ENCOUNTER — Other Ambulatory Visit: Payer: Self-pay | Admitting: Urology

## 2020-02-07 ENCOUNTER — Other Ambulatory Visit (HOSPITAL_COMMUNITY): Payer: Self-pay | Admitting: Urology

## 2020-02-07 DIAGNOSIS — R972 Elevated prostate specific antigen [PSA]: Secondary | ICD-10-CM

## 2020-02-14 ENCOUNTER — Other Ambulatory Visit: Payer: Self-pay

## 2020-02-14 ENCOUNTER — Encounter (HOSPITAL_BASED_OUTPATIENT_CLINIC_OR_DEPARTMENT_OTHER): Payer: Self-pay | Admitting: Urology

## 2020-02-14 NOTE — Progress Notes (Addendum)
Addendum: cardiac clearance note michele lenze pa [phone] epic for 02-22-2020 surgery   Spoke w/ via phone for pre-op interview---PT Lab needs dos----  I stat 8, ekg  ( do I stat 8 and ekg if ekg and bmet not back from [facility] done 02-21-2020)             COVID test ------02-19-2020 1000 am Arrive at -------730 am 02-22-2020 NPO after MN NO Solid Food.  Clear liquids from MN until---630 am then npo Medications to take morning of surgery -----nexium Diabetic medication -----n/a Patient Special Instructions -----none Pre-Op special Istructions -----none Patient verbalized understanding of instructions that were given at this phone interview. Patient denies shortness of breath, chest pain, fever, cough at this phone interview.  Anesthesia : hx of chronic diastolic chf (pt denies), htn , ablation for afib 2018 sob with heavy exertion, last office dr allred cardiology 08-29-2018 says to follow up in chf clinic in 6 months (no chf clinic visit done), hx pneumonia 2018, pt denies sob or cardiac S & S at pre op call, pt has instructions to stop 81 mg aspirin 5 days before surgery  chaty to Janett Billow zanetto pa for review  PCP: dr Elesa Hacker Cardiologist :dr Rayann Heman 08-29-2018 lov epic Chest x-ray :none EKG :none recent Echo :none Stress test:none Cardiac Cath : none Activity level: climbs stairs without problems Sleep Study/ CPAP :n/a Fasting Blood Sugar :      / Checks Blood Sugar -- times a day: n/a  Blood Thinner/ Instructions /Last Dose:n/a ASA / Instructions/ Last Dose : pt instrcuted by dr Jeffie Pollock to stop 81 mg aspirin 5 days before surgery

## 2020-02-16 ENCOUNTER — Telehealth: Payer: Self-pay | Admitting: Cardiology

## 2020-02-16 NOTE — Progress Notes (Signed)
ADDENDUM: To previous progress note from [facility] from 02-14-2020, whom did pre-op interview.  Chart reviewed by anesthesia, Konrad Felix PA,  Stated pt will need cardiac clearance since pt did follow up every 6 months in Afib clinic as pt was told per Dr Rayann Heman note 08-29-2018, even though he may have stopped xarelto due to hematuria he still takes asa.  Called and left message w/ Jeannene Patella, OR scheduler for Dr Jeffie Pollock, let her pt needs cardiac clearance. Primary cardiologist , Dr Marlou Porch, and EP is Dr Rayann Heman.

## 2020-02-16 NOTE — Telephone Encounter (Signed)
        [facility] Pre-operative Risk Assessment    HEARTCARE STAFF: - Please ensure there is not already an duplicate clearance open for this procedure. - Under Visit Info/Reason for Call, type in Other and utilize the format Clearance MM/DD/YY or Clearance TBD. Do not use dashes or single digits. - If request is for dental extraction, please clarify the # of teeth to be extracted.  Request for surgical clearance:  1. What type of surgery is being performed? Bladder tumor surgery, prostate biopsy  2. When is this surgery scheduled? 02/22/2020  3. What type of clearance is required (medical clearance vs. Pharmacy clearance to hold med vs. Both)? both  4. Are there any medications that need to be held prior to surgery and how long?Asa  5. Practice name and name of physician performing surgery? Dr. Jeffie Pollock  6. What is the office phone number? [facility]   7.   What is the office fax number? [phone]  8.   Anesthesia type (None, local, MAC, general) ? General   Brian [name] 02/16/2020, 10:36 AM  _________________________________________________________________   (provider comments below)

## 2020-02-19 ENCOUNTER — Other Ambulatory Visit (HOSPITAL_COMMUNITY)
Admission: RE | Admit: 2020-02-19 | Discharge: 2020-02-19 | Disposition: A | Discharge status: Home / Self Care | Payer: Medicare Other | Source: Ambulatory Visit | Attending: Urology | Admitting: Urology

## 2020-02-19 DIAGNOSIS — Z20822 Contact with and (suspected) exposure to covid-19: Secondary | ICD-10-CM | POA: Diagnosis not present

## 2020-02-19 DIAGNOSIS — Z01812 Encounter for preprocedural laboratory examination: Secondary | ICD-10-CM | POA: Diagnosis not present

## 2020-02-19 LAB — SARS CORONAVIRUS 2 (TAT 6-24 HRS): SARS Coronavirus 2: NEGATIVE

## 2020-02-19 NOTE — Progress Notes (Signed)
Left voicemail message for Brian [name] pt has cardiac clearance appt for 02-21-2020 at 815 am

## 2020-02-19 NOTE — Telephone Encounter (Signed)
Spoke with patient and he agreed to come in to see Ermalinda Barrios on 02/21/20 at 8:15.

## 2020-02-19 NOTE — Telephone Encounter (Signed)
   Primary Cardiologist:Mark Marlou Porch, MD  Chart reviewed as part of pre-operative protocol coverage. Because of Brian [name] past medical history and time since last visit, they will require a follow-up visit in order to better assess preoperative cardiovascular risk.  Pre-op covering staff: - Please schedule appointment and call patient to inform them. If patient already had an upcoming appointment within acceptable timeframe, please add "pre-op clearance" to the appointment notes so provider is aware. - Please contact requesting surgeon's office via preferred method (i.e, phone, fax) to inform them of need for appointment prior to surgery.  If applicable, this message will also be routed to pharmacy pool and/or primary cardiologist for input on holding anticoagulant/antiplatelet agent as requested below so that this information is available to the clearing provider at time of patient's appointment.   [place], [place]  02/19/2020, 12:11 AM

## 2020-02-19 NOTE — Telephone Encounter (Signed)
Patient last seen by Dr Rayann Heman on 08/29/2018 and was told to follow up with Afib clinic every 6 months. Has not seen gen cards in over 3 years. Will route to afib clinic to see if they can see pt for cardiac clearance prior to his scheduled procedure on 02/22/20.

## 2020-02-19 NOTE — Progress Notes (Signed)
Cardiology Office Note    Date:  02/21/2020   ID:  Brian, [name] 1950-10-19, MRN [id number]  PCP:  Orpah Melter, MD  Cardiologist: Candee Furbish, MD EPS: [name], MD  Chief Complaint  Patient presents with  . Pre-op Exam    History of Present Illness:  Brian [name] is a 69 y.o. male with history of Atrial fib on Xarelto, atrial flutter status post ablation, hypertension, chronic diastolic dysfunction.  Patient saw Dr. Rayann Heman 08/29/2018 at which time he had stopped his Xarelto due to concerns of cost and bleeding had a UTI with hematuria in the past.  Dr. Lamount Cohen offered a ILR to evaluate his A. fib burden but patient declined.  Xarelto was restarted for CHA2DS2-VASc of 3.  Patient last saw Dr. Marlou Porch in 2018 at which time he was referred to Dr. Rayann Heman for A. fib.  Patient is on my schedule for preoperative clearance for transurethral resection for bladder tumor by Dr. Irine Seal scheduled 02/22/2020 last 2D echo 09/2015 LVEF 55 to 60% with normal diastolic parameters.  Lipomatous hypertrophy of the atrial septum, trivial AI.  Patient denies chest pain. Gets short of breath when he walks 1/2 mile and has to rest. No palpitations. Ran out of Kdur 2 weeks ago and lasix yesterday. Walks 30 min twice a day. Patient stopped Xarelto 2 yrs ago because of expense and takes a baby ASA.   Past Medical History:  Diagnosis Date  . Anemia    none since 2019  . Anxiety   . Atrial flutter ([place])    a. 08/2012  . CHF (congestive heart failure) (HCC)    chronic diastolic chf, pt denies  . GERD (gastroesophageal reflux disease)   . Gout    pt denies  . History of kidney stones    passed  . Hypertension   . Obesity   . Paroxysmal atrial fibrillation (HCC)    a fib cardioverion done 2018  . Pneumonia 09/2016  . Rheumatoid arthritis ([place])    "a little bit qwhere" (07/22/2017)  . Shortness of breath    with heavy  exertion  . Tobacco abuse     Past Surgical History:    Procedure Laterality Date  . A-FLUTTER ABLATION N/A 12/29/2016   Procedure: A-Flutter Ablation;  Surgeon: [name], MD;  Location: [facility];  Service: Cardiovascular;  Laterality: N/A;  . CARDIOVERSION N/A 11/05/2016   Procedure: CARDIOVERSION;  Surgeon: Sueanne Margarita, MD;  Location: [facility];  Service: Cardiovascular;  Laterality: N/A;  . IR THORACENTESIS ASP PLEURAL SPACE W/IMG GUIDE  10/01/2016  . JOINT REPLACEMENT     Right hip and left knee  . MICROLARYNGOSCOPY  09/02/2007   with excision of right vocal cord mass, Dr. Constance Holster  . TOTAL HIP ARTHROPLASTY Right 11/17/2017   Procedure: RIGHT TOTAL HIP ARTHROPLASTY ANTERIOR APPROACH;  Surgeon: Leandrew Koyanagi, MD;  Location: [facility];  Service: Orthopedics;  Laterality: Right;  . TOTAL HIP ARTHROPLASTY Left 03/07/2018   Procedure: LEFT TOTAL HIP ARTHROPLASTY ANTERIOR APPROACH;  Surgeon: Leandrew Koyanagi, MD;  Location: [facility];  Service: Orthopedics;  Laterality: Left;  . TOTAL KNEE ARTHROPLASTY Left 07/22/2017   Procedure: LEFT TOTAL KNEE ARTHROPLASTY;  Surgeon: Leandrew Koyanagi, MD;  Location: [facility];  Service: Orthopedics;  Laterality: Left;    Current Medications: Current Meds  Medication Sig  . Adalimumab (HUMIRA) 40 MG/0.4ML PSKT Inject into the skin. no hold since may 2021  . aspirin EC 81  MG tablet Take 81 mg by mouth daily. Swallow whole.  . cephALEXin (KEFLEX) 500 MG capsule Take 500 mg by mouth 3 (three) times daily.  Marland Kitchen doxycycline (VIBRA-TABS) 100 MG tablet Take 100 mg by mouth 2 (two) times daily.  Marland Kitchen esomeprazole (NEXIUM) 20 MG capsule Take 20 mg by mouth daily at 12 noon.  . folic acid (FOLVITE) 1 MG tablet Take 1 mg by mouth daily.  . furosemide (LASIX) 40 MG tablet Take 2 tablets (80 mg total) by mouth daily.  . methotrexate (RHEUMATREX) 2.5 MG tablet Take 20 mg by mouth every Friday. In the morning.  . potassium chloride SA (KLOR-CON M20) 20 MEQ tablet Take 1 tablet (20 mEq total) by mouth daily.  Marland Kitchen senna-docusate (SENOKOT  S) 8.6-50 MG tablet Take 1 tablet by mouth at bedtime as needed. (Patient taking differently: Take 2 tablets by mouth daily. )  . tamsulosin (FLOMAX) 0.4 MG CAPS capsule Take 0.4 mg by mouth at bedtime.  . [DISCONTINUED] furosemide (LASIX) 40 MG tablet Take 2 tablets (80 mg total) by mouth daily. 3rd and final attempt: MUST CALL [phone] TO CONT REFILLS. Thank you. (Patient taking differently: Take 80 mg by mouth daily. )  . [DISCONTINUED] KLOR-CON M20 20 MEQ tablet TAKE 1 TABLET BY MOUTH DAILY. PLEASE SCHEDULE ANNUAL APPT WITH DR. Marlou Porch FOR REFILLS. [phone]     Allergies:   Tape   Social History   Socioeconomic History  . Marital status: Divorced    Spouse name: Not on file  . Number of children: 3  . Years of education: Not on file  . Highest education level: Not on file  Occupational History  . Not on file  Tobacco Use  . Smoking status: Current Every Day Smoker    Packs/day: 0.13    Years: 45.00    Pack years: 5.85    Types: Cigarettes  . Smokeless tobacco: Never Used  . Tobacco comment: "quit off and on"  Vaping Use  . Vaping Use: Never used  Substance and Sexual Activity  . Alcohol use: Yes    Comment: 07/22/2017 "nothing since 2016"  . Drug use: No    Comment: 07/22/2017 "none since 11/2016" cocaine (sister is not aware)  . Sexual activity: Not Currently  Other Topics Concern  . Not on file  Social History Narrative   Lives in [place]    Works at a convenience store   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   Divorced with 3 children and 9 grandchildren, he states ex-wife is still a friend   Scientist, physiological Strain:   . Difficulty of Paying Living Expenses: Not on file  Food Insecurity:   . Worried About Charity fundraiser in the Last Year: Not on file  . Ran Out of Food in the Last Year: Not on file  Transportation Needs:   . Lack of Transportation (Medical): Not on file  . Lack of Transportation (Non-Medical): Not on file   Physical Activity:   . Days of Exercise per Week: Not on file  . Minutes of Exercise per Session: Not on file  Stress:   . Feeling of Stress : Not on file  Social Connections:   . Frequency of Communication with Friends and Family: Not on file  . Frequency of Social Gatherings with Friends and Family: Not on file  . Attends Religious Services: Not on file  . Active Member of Clubs or Organizations: Not on file  . Attends Club  or Organization Meetings: Not on file  . Marital Status: Not on file     Family History:  The patient's family history includes Cancer in his father and mother; Diabetes in his sister; Heart attack in his father; Heart disease in his father.   ROS:   Please see the history of present illness.    ROS All other systems reviewed and are negative.   PHYSICAL EXAM:   VS:  BP 140/62   Pulse 82   Ht 5\' 10"  (1.778 m)   Wt 261 lb 6.4 oz (118.6 kg)   SpO2 96%   BMI 37.51 kg/m   Physical Exam  GEN: Obese, in no acute distress  Neck: no JVD, carotid bruits, or masses Cardiac:RRR; no murmurs, rubs, or gallops  Respiratory:  Decreased breath sounds but clear to auscultation bilaterally, normal work of breathing GI: soft, nontender, nondistended, + BS Ext: without cyanosis, clubbing, or edema, Good distal pulses bilaterally Neuro:  Alert and Oriented x 3 Psych: euthymic mood, full affect  Wt Readings from Last 3 Encounters:  02/21/20 261 lb 6.4 oz (118.6 kg)  08/29/18 233 lb 3.2 oz (105.8 kg)  05/09/18 243 lb (110.2 kg)      Studies/Labs Reviewed:   EKG:  EKG is  ordered today.  The ekg ordered today demonstrates NSR, normal EKG  Recent Labs: No results found for requested labs within last 8760 hours.   Lipid Panel    Component Value Date/Time   CHOL 135 09/28/2015 0402   TRIG 126 09/28/2015 0402   HDL 34 (L) 09/28/2015 0402   CHOLHDL 4.0 09/28/2015 0402   VLDL 25 09/28/2015 0402   LDLCALC 76 09/28/2015 0402    Additional studies/ records that  were reviewed today include:   ECHO 09/28/15 - Left ventricle: The cavity size was normal. Systolic function was   normal. The estimated ejection fraction was in the range of 55%   to 60%. Wall motion was normal; there were no regional wall   motion abnormalities. Left ventricular diastolic function   parameters were normal. - Aortic valve: There was trivial regurgitation. Valve area (VTI):   1.79 cm^2. Valve area (Vmax): 2.01 cm^2. Valve area (Vmean): 2   cm^2. - Atrial septum: There was increased thickness of the septum,   consistent with lipomatous hypertrophy     ASSESSMENT:    1. Preoperative clearance   2. Paroxysmal atrial fibrillation (HCC)   3. Atrial flutter, unspecified type ([place])   4. Chronic diastolic CHF (congestive heart failure) ([place])   5. Tobacco abuse      PLAN:  In order of problems listed above:  Preoperative clearance for bladder tumor resection by Dr. Irine Seal 02/22/2020. Patient hasn't been here in 3 yrs. Has PAF and stopped xarelto 2 yrs ago.  CHA2DS2-VASc equals 3.  Recommend he restart Xarelto or Eliquis after surgery and clearance by urology.  Cardiac risk index 0.9% functional capacity is greater than 5 METS.  No chest pain or palpitations.  EKG normal.  He walks 30 minutes twice daily.  No further work-up needed prior to bladder surgery.  Will check labs today because he ran out of his potassium 2 weeks ago. According to the Revised Cardiac Risk Index (RCRI), his Perioperative Risk of Major Cardiac Event is (%): 0.9  His Functional Capacity in METs is: 5.07 according to the [facility] Activity Status Index (DASI).    Atrial fibrillation CHA2DS2-VASc score equals 3 stopped Xarelto 2 years ago because of cost.  Patient  now has new insurance.  We will reach out to [facility] and see if we can get him back on Xarelto or Eliquis after his surgery.  History of atrial flutter status post ablation  History of diastolic CHF currently compensated.  Ran out of Lasix  yesterday and potassium 2 weeks ago.  Check labs.  Refill medications.  Tobacco abuse-smoking cessation recommended  Medication Adjustments/Labs and Tests Ordered: Current medicines are reviewed at length with the patient today.  Concerns regarding medicines are outlined above.  Medication changes, Labs and Tests ordered today are listed in the Patient Instructions below. Patient Instructions  Medication Instructions:  Your physician recommends that you continue on your current medications as directed. Please refer to the Current Medication list given to you today.  *If you need a refill on your cardiac medications before your next appointment, please call your pharmacy*   Lab Work: TODAY: [facility], [place]  If you have labs (blood work) drawn today and your tests are completely normal, you will receive your results only by: Marland Kitchen MyChart Message (if you have MyChart) OR . A paper copy in the mail If you have any lab test that is abnormal or we need to change your treatment, we will call you to review the results.   Testing/Procedures: None   Follow-Up: At [facility], you and your health needs are our priority.  As part of our continuing mission to provide you with exceptional heart care, we have created designated Provider Care Teams.  These Care Teams include your primary Cardiologist (physician) and Advanced Practice Providers (APPs -  Physician Assistants and Nurse Practitioners) who all work together to provide you with the care you need, when you need it.  We recommend signing up for the patient portal called "MyChart".  Sign up information is provided on this After Visit Summary.  MyChart is used to connect with patients for Virtual Visits (Telemedicine).  Patients are able to view lab/test results, encounter notes, upcoming appointments, etc.  Non-urgent messages can be sent to your provider as well.   To learn more about what you can do with MyChart, go to [email or web address].     Your next appointment:   05/27/2020  The format for your next appointment:   In Person  Provider:   Candee Furbish, MD   Other Instructions None     Signed, Ermalinda Barrios, PA-C  02/21/2020 8:55 AM    [address], [place], [place]  [address] Phone: [phone]; Fax: [phone]

## 2020-02-21 ENCOUNTER — Other Ambulatory Visit: Payer: Self-pay

## 2020-02-21 ENCOUNTER — Encounter: Payer: Self-pay | Admitting: Physician Assistant

## 2020-02-21 ENCOUNTER — Ambulatory Visit (INDEPENDENT_AMBULATORY_CARE_PROVIDER_SITE_OTHER): Payer: Medicare Other | Admitting: Physician Assistant

## 2020-02-21 VITALS — BP 140/62 | HR 82 | Ht 70.0 in | Wt 261.4 lb

## 2020-02-21 DIAGNOSIS — Z01818 Encounter for other preprocedural examination: Secondary | ICD-10-CM | POA: Diagnosis not present

## 2020-02-21 DIAGNOSIS — I480 Paroxysmal atrial fibrillation: Secondary | ICD-10-CM

## 2020-02-21 DIAGNOSIS — I5032 Chronic diastolic (congestive) heart failure: Secondary | ICD-10-CM

## 2020-02-21 DIAGNOSIS — I4892 Unspecified atrial flutter: Secondary | ICD-10-CM | POA: Diagnosis not present

## 2020-02-21 DIAGNOSIS — Z720 Tobacco use: Secondary | ICD-10-CM

## 2020-02-21 LAB — LIPID PANEL
Chol/HDL Ratio: 3.3 ratio (ref 0.0–5.0)
Cholesterol, Total: 148 mg/dL (ref 100–199)
HDL: 45 mg/dL (ref >39)
LDL Chol Calc (NIH): 79 mg/dL (ref 0–99)
Triglycerides: 133 mg/dL (ref 0–149)
VLDL Cholesterol Cal: 24 mg/dL (ref 5–40)

## 2020-02-21 LAB — BASIC METABOLIC PANEL
BUN/Creatinine Ratio: 16 (ref 10–24)
BUN: 13 mg/dL (ref 8–27)
CO2: 24 mmol/L (ref 20–29)
Calcium: 9 mg/dL (ref 8.6–10.2)
Chloride: 103 mmol/L (ref 96–106)
Creatinine, Ser: 0.82 mg/dL (ref 0.76–1.27)
GFR calc Af Amer: 105 mL/min/{1.73_m2} (ref >59)
GFR calc non Af Amer: 91 mL/min/{1.73_m2} (ref >59)
Glucose: 102 mg/dL — ABNORMAL HIGH (ref 65–99)
Potassium: 4.7 mmol/L (ref 3.5–5.2)
Sodium: 141 mmol/L (ref 134–144)

## 2020-02-21 MED ORDER — POTASSIUM CHLORIDE CRYS ER 20 MEQ PO TBCR: 20.0000 meq | EXTENDED_RELEASE_TABLET | Freq: Every day | ORAL | 3 refills | Status: DC

## 2020-02-21 MED ORDER — FUROSEMIDE 40 MG PO TABS
80.0000 mg | ORAL_TABLET | Freq: Every day | ORAL | 3 refills | Status: DC
Start: 2020-02-21 — End: 2020-11-04

## 2020-02-21 NOTE — Patient Instructions (Signed)
Medication Instructions:  Your physician recommends that you continue on your current medications as directed. Please refer to the Current Medication list given to you today.  *If you need a refill on your cardiac medications before your next appointment, please call your pharmacy*   Lab Work: TODAY: [facility], [place]  If you have labs (blood work) drawn today and your tests are completely normal, you will receive your results only by: Marland Kitchen MyChart Message (if you have MyChart) OR . A paper copy in the mail If you have any lab test that is abnormal or we need to change your treatment, we will call you to review the results.   Testing/Procedures: None   Follow-Up: At [facility], you and your health needs are our priority.  As part of our continuing mission to provide you with exceptional heart care, we have created designated Provider Care Teams.  These Care Teams include your primary Cardiologist (physician) and Advanced Practice Providers (APPs -  Physician Assistants and Nurse Practitioners) who all work together to provide you with the care you need, when you need it.  We recommend signing up for the patient portal called "MyChart".  Sign up information is provided on this After Visit Summary.  MyChart is used to connect with patients for Virtual Visits (Telemedicine).  Patients are able to view lab/test results, encounter notes, upcoming appointments, etc.  Non-urgent messages can be sent to your provider as well.   To learn more about what you can do with MyChart, go to [email or web address].    Your next appointment:   05/27/2020  The format for your next appointment:   In Person  Provider:   Candee Furbish, MD   Other Instructions None

## 2020-02-21 NOTE — H&P (Signed)
My PSA is elevated above the normal range.     Brian [name] returns in f/u for his history of an elevated PSA, e. coli UTI and BPH with BOO. He remains on cephalexin for suppression but his UA is still abnormal today with pyuria and hematuria. He had a CT that showed no upper tract abnormalities but the bladder was not clearly seen because of artifact. His LUTS have markedly improved with an IPSS of 13 on tamsulsoin and cephalexin which is down from 31. He has no further severe dysuria but has some mild dysuria and darker urine since he came off of the cephalexin TID. His PSA was still elevated at 5.99 with a 7% f/t ratio.    GU Hx: Brian [name] is a 69 yo male who is sent by Dr. Orpah Melter for an elevated PSA of 6.02 on 12/06/19. He had an e. coli UTI on culture on 10/27/19. He has been treated with cephalexin x 2 and doxycycline x 2 in the last month and a half. He took his last doxycycline last night. He has been having hematuria and has passed a couple of clots. He has dysuria and marked LUTS with nocturia x 5, urgency, frequency, a weak stream, straining, intermittency and a sensation of incomplete emptying. His IPSS is 31. He has a prior history of stones remotely. He has had no GU surgery. He has had no other UTI's. He has had no flank pain.    ALLERGIES: None   MEDICATIONS: Aspirin 81 mg tablet,chewable  Tamsulosin Hcl 0.4 mg capsule 1 capsule PO Daily  Cephalexin  Furosemide 40 mg tablet  Humira 40 mg/0.8 ml syringe kit  Methotrexate 2.5 mg tablet  Nexium 24Hr  Stool Softner     GU PSH: Locm 300-399Mg/Ml Iodine,1Ml - 12/22/2019       PSH Notes: rt hip replacement 2019   NON-GU PSH: Hip Replacement, Right - about 2019, Left - about 2019, Right - about 2019, Bilateral - about 2019 Knee replacement, Left - about 2019     GU PMH: Acute Cystitis/UTI, He has had a recent UTI and his urine suggests possible infection. UTI+ today. - 12/21/2019 BPH w/LUTS, He has severe LUTS which could be from BPH  with BOO but he has had a recent UTI and has hematuria with a history of stones. - 12/21/2019 Dysuria, UTI+ today. I will treat based on the culture. - 12/21/2019 Elevated PSA, His PSA was up to 6.02 and his prostate is firm but he has had a recent UTI which can impact the PSA and prostate exam. I have repeated the PSA today. He will likely need a prostate [place] and biopsy in the future but I need to make sure he is free of infection. - 12/21/2019 Gross hematuria, He has had some intermittent gross hematuria. he will have a CT hematuria study and a possible cystoscopy. - 12/21/2019      PMH Notes: cardiac ablation 2016   NON-GU PMH: Atherosclerosis of aorta Atrial Fibrillation Hypertension Iron deficiency anemia, unspecified Rheumatoid Arthritis    FAMILY HISTORY: Breast Cancer - Mother Lung Cancer - Father Prostate Cancer - Uncle Type 2 Diabetes - Sister   SOCIAL HISTORY: Marital Status: Divorced Preferred Language: English; Race: White Current Smoking Status: Patient smokes. Has smoked since 11/28/1979. Smokes 1/2 pack per day.   Tobacco Use Assessment Completed: Used Tobacco in last 30 days? Does not drink caffeine. Patient's occupation is/was retired.     Notes: 2 daughters 1 son   REVIEW OF  SYSTEMS:    GU Review Male:   Patient reports burning/ pain with urination and leakage of urine. Patient denies frequent urination, hard to postpone urination, get up at night to urinate, stream starts and stops, trouble starting your stream, have to strain to urinate , erection problems, and penile pain.  Gastrointestinal (Upper):   Patient denies nausea, vomiting, and indigestion/ heartburn.  Gastrointestinal (Lower):   Patient denies diarrhea and constipation.  Constitutional:   Patient denies fever, night sweats, weight loss, and fatigue.  Skin:   Patient denies skin rash/ lesion and itching.  Eyes:   Patient denies blurred vision and double vision.  Ears/ Nose/ Throat:   Patient denies sore  throat and sinus problems.  Hematologic/Lymphatic:   Patient denies swollen glands and easy bruising.  Cardiovascular:   Patient denies leg swelling and chest pains.  Respiratory:   Patient denies cough and shortness of breath.  Endocrine:   Patient denies excessive thirst.  Musculoskeletal:   Patient denies back pain and joint pain.  Neurological:   Patient denies headaches and dizziness.  Psychologic:   Patient denies depression and anxiety.   Notes: hematuria    VITAL SIGNS:      02/05/2020 10:19 AM  Weight 258 lb / 117.03 kg  Height 70 in / 177.8 cm  BP 118/83 mmHg  Heart Rate 77 /min  Temperature 97.5 F / 36.3 C  BMI 37.0 kg/m   Complexity of Data:  Records Review:   AUA Symptom Score, Previous Patient Records  Urine Test Review:   Urinalysis  X-Ray Review: C.T. Hematuria: Reviewed Films. Reviewed Report. Discussed With Patient.     12/22/19  PSA  Total PSA 5.99 ng/mL  Free PSA 0.42 ng/mL  % Free PSA 7 % PSA   Notes:                     UTI+ reviewed.    PROCEDURES:         Flexible Cystoscopy - 52000  Risks, benefits, and some of the potential complications of the procedure were discussed. 34m of 2% lidocaine jelly was instilled intraurethrally.  Pronox administered as needed. Cipro 5087mgiven for antibiotic prophylaxis.     Meatus:  Normal size. Normal location. Normal condition.  Urethra:  No strictures.  External Sphincter:  Normal.  Verumontanum:  Normal.  Prostate:  Obstructing. Moderate hyperplasia. with a necrotic papillary lesion at the anterior bladder neck that could be urothelial or prostatic in origin.   Bladder Neck:  Non-obstructing.  Ureteral Orifices:  Not well seen.   Bladder:  Mild trabeculation. there are tumors at the anterior bladder neck and anterior left lateral wall that is about 2-3cm with surrounding erythema. the tumor has some papillary features but also appears somewhat sessile. No stones.      The procedure was well tolerated  and there were no complications.         UTI+ - [id number] Plus has been ordered to detect urinary organisms by PCR.          Urinalysis w/Scope Dipstick Dipstick Cont'd Micro  Color: Yellow Bilirubin: Neg mg/dL WBC/hpf: 20 - 40/hpf  Appearance: Slightly Cloudy Ketones: Neg mg/dL RBC/hpf: 40 - 60/hpf  Specific Gravity: 1.030 Blood: 3+ ery/uL Bacteria: Rare (0-9/hpf)  pH: 5.5 Protein: 2+ mg/dL Cystals: NS (Not Seen)  Glucose: Neg mg/dL Urobilinogen: 0.2 mg/dL Casts: NS (Not Seen)    Nitrites: Neg Trichomonas: Not Present  Leukocyte Esterase: 2+ leu/uL Mucous: Present      Epithelial Cells: 0 - 5/hpf      Yeast: NS (Not Seen)      Sperm: Not Present         Urinalysis w/Scope Dipstick Dipstick Cont'd Micro  Color: Yellow Bilirubin: Neg mg/dL WBC/hpf: 6 - 10/hpf  Appearance: Slightly Cloudy Ketones: Neg mg/dL RBC/hpf: 40 - 60/hpf  Specific Gravity: 1.020 Blood: 3+ ery/uL Bacteria: Rare (0-9/hpf)  pH: 6.0 Protein: 2+ mg/dL Cystals: NS (Not Seen)  Glucose: Neg mg/dL Urobilinogen: 0.2 mg/dL Casts: NS (Not Seen)    Nitrites: Neg Trichomonas: Not Present    Leukocyte Esterase: 1+ leu/uL Mucous: Present      Epithelial Cells: 0 - 5/hpf      Yeast: NS (Not Seen)      Sperm: Not Present    ASSESSMENT:      ICD-10 Details  1 GU:   Gross hematuria - R31.0 Chronic, Improving - His urine has darkened back up on cephalexin daily. CT showed no upper tract lesions.   2   Bladder tumor/neoplasm - D41.4 Undiagnosed New Problem - He has a necrotic neoplasm at the anterior bladder neck and a somewhat sessile appearing lesion on the left anterior lateral wall with 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 He is going to need a TURBT and I reviewed the risks of bleeding, infection, injury to urinary structures. Need for a catheter and secondary resections, thrombotic events and anesthetic complications.   3   Elevated PSA - R97.20 Chronic, Stable - His  PSA remained elevated with a low free to total ratio. He is going to need a prostate [place] and biopsy at the time of his TURBT.   4   BPH w/LUTS - N40.1 Chronic, Improving - He has improved with the antibiotic and tamsulosin.   5   Urinary Urgency - R39.15 Chronic, Improving     PLAN:           Schedule Return Visit/Planned Activity: ASAP - Schedule Surgery          Document Letter(s):  Created for Patient: Clinical Summary         Notes:   CC: Dr. Orpah Melter.        Next Appointment:      Next Appointment: 03/21/2020 09:15 AM    Appointment Type: Office Visit Established Patient    Location: [facility] [phone]    Provider: Jiles Crocker, NP    Reason for Visit: 3 mo recheck

## 2020-02-22 ENCOUNTER — Encounter (HOSPITAL_BASED_OUTPATIENT_CLINIC_OR_DEPARTMENT_OTHER): Payer: Self-pay | Admitting: Urology

## 2020-02-22 ENCOUNTER — Ambulatory Visit (HOSPITAL_BASED_OUTPATIENT_CLINIC_OR_DEPARTMENT_OTHER): Payer: Medicare Other | Admitting: Physician Assistant

## 2020-02-22 ENCOUNTER — Ambulatory Visit (HOSPITAL_COMMUNITY)
Admission: RE | Admit: 2020-02-22 | Discharge: 2020-02-22 | Disposition: A | Discharge status: Home / Self Care | Payer: Medicare Other | Source: Ambulatory Visit | Attending: Urology | Admitting: Urology

## 2020-02-22 ENCOUNTER — Encounter (HOSPITAL_BASED_OUTPATIENT_CLINIC_OR_DEPARTMENT_OTHER)
Admission: RE | Disposition: A | Discharge status: Home / Self Care | Payer: Self-pay | Source: Home / Self Care | Attending: Urology

## 2020-02-22 ENCOUNTER — Other Ambulatory Visit: Payer: Self-pay

## 2020-02-22 ENCOUNTER — Ambulatory Visit (HOSPITAL_BASED_OUTPATIENT_CLINIC_OR_DEPARTMENT_OTHER)
Admission: RE | Admit: 2020-02-22 | Discharge: 2020-02-22 | Disposition: A | Discharge status: Home / Self Care | Payer: Medicare Other | Attending: Urology | Admitting: Urology

## 2020-02-22 DIAGNOSIS — C675 Malignant neoplasm of bladder neck: Secondary | ICD-10-CM | POA: Insufficient documentation

## 2020-02-22 DIAGNOSIS — R972 Elevated prostate specific antigen [PSA]: Secondary | ICD-10-CM | POA: Diagnosis not present

## 2020-02-22 DIAGNOSIS — C672 Malignant neoplasm of lateral wall of bladder: Secondary | ICD-10-CM | POA: Diagnosis not present

## 2020-02-22 DIAGNOSIS — I110 Hypertensive heart disease with heart failure: Secondary | ICD-10-CM | POA: Diagnosis not present

## 2020-02-22 DIAGNOSIS — R310 Gross hematuria: Secondary | ICD-10-CM | POA: Diagnosis not present

## 2020-02-22 DIAGNOSIS — I5032 Chronic diastolic (congestive) heart failure: Secondary | ICD-10-CM | POA: Diagnosis not present

## 2020-02-22 DIAGNOSIS — I4819 Other persistent atrial fibrillation: Secondary | ICD-10-CM | POA: Diagnosis not present

## 2020-02-22 DIAGNOSIS — A419 Sepsis, unspecified organism: Secondary | ICD-10-CM | POA: Diagnosis not present

## 2020-02-22 DIAGNOSIS — R3912 Poor urinary stream: Secondary | ICD-10-CM | POA: Diagnosis not present

## 2020-02-22 DIAGNOSIS — Z96641 Presence of right artificial hip joint: Secondary | ICD-10-CM | POA: Diagnosis not present

## 2020-02-22 DIAGNOSIS — Z96652 Presence of left artificial knee joint: Secondary | ICD-10-CM | POA: Diagnosis not present

## 2020-02-22 DIAGNOSIS — M069 Rheumatoid arthritis, unspecified: Secondary | ICD-10-CM | POA: Diagnosis not present

## 2020-02-22 DIAGNOSIS — Z79899 Other long term (current) drug therapy: Secondary | ICD-10-CM | POA: Insufficient documentation

## 2020-02-22 DIAGNOSIS — N401 Enlarged prostate with lower urinary tract symptoms: Secondary | ICD-10-CM | POA: Diagnosis not present

## 2020-02-22 DIAGNOSIS — Z7982 Long term (current) use of aspirin: Secondary | ICD-10-CM | POA: Insufficient documentation

## 2020-02-22 DIAGNOSIS — R3915 Urgency of urination: Secondary | ICD-10-CM | POA: Insufficient documentation

## 2020-02-22 DIAGNOSIS — D494 Neoplasm of unspecified behavior of bladder: Secondary | ICD-10-CM | POA: Diagnosis not present

## 2020-02-22 DIAGNOSIS — F17210 Nicotine dependence, cigarettes, uncomplicated: Secondary | ICD-10-CM | POA: Diagnosis not present

## 2020-02-22 DIAGNOSIS — R351 Nocturia: Secondary | ICD-10-CM | POA: Insufficient documentation

## 2020-02-22 DIAGNOSIS — C679 Malignant neoplasm of bladder, unspecified: Secondary | ICD-10-CM | POA: Diagnosis not present

## 2020-02-22 HISTORY — PX: PROSTATE BIOPSY: SHX241

## 2020-02-22 HISTORY — PX: TRANSURETHRAL RESECTION OF BLADDER TUMOR WITH MITOMYCIN-C: SHX6459

## 2020-02-22 LAB — POCT HEMOGLOBIN-HEMACUE: Hemoglobin: 14.9 g/dL (ref 13.0–17.0)

## 2020-02-22 SURGERY — TRANSURETHRAL RESECTION OF BLADDER TUMOR WITH MITOMYCIN-C
Anesthesia: General | Site: Prostate

## 2020-02-22 MED ORDER — PROPOFOL 10 MG/ML IV BOLUS
INTRAVENOUS | Status: AC
  Filled 2020-02-22: qty 20

## 2020-02-22 MED ORDER — GEMCITABINE CHEMO FOR BLADDER INSTILLATION 2000 MG
2000.0000 mg | Freq: Once | INTRAVENOUS | Status: AC
  Administered 2020-02-22: 2000 mg via INTRAVESICAL
  Filled 2020-02-22: qty 2000

## 2020-02-22 MED ORDER — ONDANSETRON HCL 4 MG/2ML IJ SOLN
INTRAMUSCULAR | Status: DC | PRN
  Administered 2020-02-22: 4 mg via INTRAVENOUS

## 2020-02-22 MED ORDER — HYDROMORPHONE HCL 1 MG/ML IJ SOLN
INTRAMUSCULAR | Status: AC
  Filled 2020-02-22: qty 1

## 2020-02-22 MED ORDER — PROPOFOL 10 MG/ML IV BOLUS
INTRAVENOUS | Status: DC | PRN
  Administered 2020-02-22: 160 mg via INTRAVENOUS

## 2020-02-22 MED ORDER — CEFTRIAXONE SODIUM 2 G IJ SOLR
INTRAMUSCULAR | Status: AC
  Filled 2020-02-22: qty 20

## 2020-02-22 MED ORDER — SODIUM CHLORIDE 0.9 % IV SOLN
INTRAVENOUS | Status: AC
  Filled 2020-02-22: qty 100

## 2020-02-22 MED ORDER — LIDOCAINE 2% (20 MG/ML) 5 ML SYRINGE
INTRAMUSCULAR | Status: AC
  Filled 2020-02-22: qty 5

## 2020-02-22 MED ORDER — ACETAMINOPHEN 325 MG RE SUPP: 650.0000 mg | RECTAL | Status: DC | PRN

## 2020-02-22 MED ORDER — FENTANYL CITRATE (PF) 100 MCG/2ML IJ SOLN
INTRAMUSCULAR | Status: AC
  Filled 2020-02-22: qty 2

## 2020-02-22 MED ORDER — MEPERIDINE HCL 25 MG/ML IJ SOLN: 6.2500 mg | INTRAMUSCULAR | Status: DC | PRN

## 2020-02-22 MED ORDER — ONDANSETRON HCL 4 MG/2ML IJ SOLN
INTRAMUSCULAR | Status: AC
  Filled 2020-02-22: qty 2

## 2020-02-22 MED ORDER — ONDANSETRON HCL 4 MG/2ML IJ SOLN: 4.0000 mg | Freq: Once | INTRAMUSCULAR | Status: DC | PRN

## 2020-02-22 MED ORDER — HYDROMORPHONE HCL 1 MG/ML IJ SOLN
0.2500 mg | INTRAMUSCULAR | Status: DC | PRN
  Administered 2020-02-22: 0.25 mg via INTRAVENOUS

## 2020-02-22 MED ORDER — LIDOCAINE 2% (20 MG/ML) 5 ML SYRINGE
INTRAMUSCULAR | Status: DC | PRN
  Administered 2020-02-22: 80 mg via INTRAVENOUS

## 2020-02-22 MED ORDER — SODIUM CHLORIDE 0.9% FLUSH: 3.0000 mL | INTRAVENOUS | Status: DC | PRN

## 2020-02-22 MED ORDER — HYDROCODONE-ACETAMINOPHEN 5-325 MG PO TABS
1.0000 | ORAL_TABLET | Freq: Four times a day (QID) | ORAL | 0 refills | Status: DC | PRN
Start: 2020-02-22 — End: 2020-03-27

## 2020-02-22 MED ORDER — GENTAMICIN SULFATE 40 MG/ML IJ SOLN
5.0000 mg/kg | INTRAVENOUS | Status: AC
  Administered 2020-02-22: 450 mg via INTRAVENOUS
  Filled 2020-02-22: qty 11.25

## 2020-02-22 MED ORDER — MORPHINE SULFATE (PF) 4 MG/ML IV SOLN: 2.0000 mg | INTRAVENOUS | Status: DC | PRN

## 2020-02-22 MED ORDER — SODIUM CHLORIDE 0.9% FLUSH: 3.0000 mL | Freq: Two times a day (BID) | INTRAVENOUS | Status: DC

## 2020-02-22 MED ORDER — FENTANYL CITRATE (PF) 100 MCG/2ML IJ SOLN
INTRAMUSCULAR | Status: DC | PRN
Start: 2020-02-22 — End: 2020-02-22
  Administered 2020-02-22: 50 ug via INTRAVENOUS
  Administered 2020-02-22 (×2): 25 ug via INTRAVENOUS
  Administered 2020-02-22 (×2): 50 ug via INTRAVENOUS

## 2020-02-22 MED ORDER — SUCCINYLCHOLINE CHLORIDE 20 MG/ML IJ SOLN
INTRAMUSCULAR | Status: DC | PRN
  Administered 2020-02-22: 140 mg via INTRAVENOUS

## 2020-02-22 MED ORDER — OXYCODONE HCL 5 MG PO TABS
5.0000 mg | ORAL_TABLET | ORAL | Status: DC | PRN
  Administered 2020-02-22: 5 mg via ORAL

## 2020-02-22 MED ORDER — SODIUM CHLORIDE 0.9 % IV SOLN
2.0000 g | INTRAVENOUS | Status: AC
  Administered 2020-02-22: 2 g via INTRAVENOUS

## 2020-02-22 MED ORDER — SODIUM CHLORIDE 0.9 % IV SOLN: INTRAVENOUS | Status: DC

## 2020-02-22 MED ORDER — ACETAMINOPHEN 325 MG PO TABS: 650.0000 mg | ORAL_TABLET | ORAL | Status: DC | PRN

## 2020-02-22 MED ORDER — OXYCODONE HCL 5 MG PO TABS
ORAL_TABLET | ORAL | Status: AC
  Filled 2020-02-22: qty 1

## 2020-02-22 MED ORDER — SODIUM CHLORIDE 0.9 % IV SOLN: 250.0000 mL | INTRAVENOUS | Status: DC | PRN

## 2020-02-22 MED ORDER — SODIUM CHLORIDE 0.9 % IR SOLN
Status: DC | PRN
  Administered 2020-02-22: 6000 mL via INTRAVESICAL

## 2020-02-22 SURGICAL SUPPLY — 36 items
BAG DRAIN URO-CYSTO SKYTR STRL (DRAIN) ×4 IMPLANT
BAG DRN RND TRDRP ANRFLXCHMBR (UROLOGICAL SUPPLIES) ×2
BAG DRN UROCATH (DRAIN) ×2
BAG URINE DRAIN 2000ML AR STRL (UROLOGICAL SUPPLIES) ×4 IMPLANT
BAG URINE LEG 500ML (DRAIN) IMPLANT
CATH FOLEY 2WAY SLVR  5CC 20FR (CATHETERS) ×2
CATH FOLEY 2WAY SLVR  5CC 22FR (CATHETERS)
CATH FOLEY 2WAY SLVR 5CC 20FR (CATHETERS) ×2 IMPLANT
CATH FOLEY 2WAY SLVR 5CC 22FR (CATHETERS) IMPLANT
CLOTH BEACON ORANGE TIMEOUT ST (SAFETY) ×4 IMPLANT
ELECT REM PT RETURN 9FT ADLT (ELECTROSURGICAL) ×4
ELECTRODE REM PT RTRN 9FT ADLT (ELECTROSURGICAL) ×2 IMPLANT
GLOVE SURG SS PI 8.0 STRL IVOR (GLOVE) ×4 IMPLANT
GOWN STRL REUS W/TWL XL LVL3 (GOWN DISPOSABLE) ×4 IMPLANT
HOLDER FOLEY CATH W/STRAP (MISCELLANEOUS) IMPLANT
INST BIOPSY MAXCORE 18GX25 (NEEDLE) ×4 IMPLANT
INSTR BIOPSY MAXCORE 18GX20 (NEEDLE) IMPLANT
IV NS IRRIG 3000ML ARTHROMATIC (IV SOLUTION) ×4 IMPLANT
KIT TURNOVER CYSTO (KITS) ×4 IMPLANT
LOOP CUT BIPOLAR 24F LRG (ELECTROSURGICAL) ×4 IMPLANT
MANIFOLD NEPTUNE II (INSTRUMENTS) ×4 IMPLANT
NDL SAFETY ECLIPSE 18X1.5 (NEEDLE) IMPLANT
NEEDLE HYPO 18GX1.5 SHARP (NEEDLE)
NEEDLE SPNL 22GX7 QUINCKE BK (NEEDLE) IMPLANT
PACK CYSTO (CUSTOM PROCEDURE TRAY) ×4 IMPLANT
PLUG CATH AND CAP STER (CATHETERS) IMPLANT
SPONGE HEMORRHOID 8X3CM (HEMOSTASIS) ×4 IMPLANT
SURGILUBE 2OZ TUBE FLIPTOP (MISCELLANEOUS) ×4 IMPLANT
SYR CONTROL 10ML LL (SYRINGE) IMPLANT
SYR TOOMEY IRRIG 70ML (MISCELLANEOUS)
SYRINGE TOOMEY IRRIG 70ML (MISCELLANEOUS) IMPLANT
TOWEL OR 17X26 10 PK STRL BLUE (TOWEL DISPOSABLE) ×4 IMPLANT
TUBE CONNECTING 12'X1/4 (SUCTIONS) ×1
TUBE CONNECTING 12X1/4 (SUCTIONS) ×3 IMPLANT
TUBING UROLOGY SET (TUBING) ×4 IMPLANT
UNDERPAD 30X36 HEAVY ABSORB (UNDERPADS AND DIAPERS) ×4 IMPLANT

## 2020-02-22 NOTE — Discharge Instructions (Addendum)
Transurethral Resection of Bladder Tumor, Care After This sheet gives you information about how to care for yourself after your procedure. Your health care provider may also give you more specific instructions. If you have problems or questions, contact your health care provider. What can I expect after the procedure? After the procedure, it is common to have:  A small amount of blood in your urine for up to 2 weeks.  Soreness or mild pain from your catheter. After your catheter is removed, you may have mild soreness, especially when urinating.  Pain in your lower abdomen. Follow these instructions at home: Medicines   Take over-the-counter and prescription medicines only as told by your health care provider.  If you were prescribed an antibiotic medicine, take it as told by your health care provider. Do not stop taking the antibiotic even if you start to feel better.  Do not drive for 24 hours if you were given a sedative during your procedure.  Ask your health care provider if the medicine prescribed to you: ? Requires you to avoid driving or using heavy machinery. ? Can cause constipation. You may need to take these actions to prevent or treat constipation:  Take over-the-counter or prescription medicines.  Eat foods that are high in fiber, such as beans, whole grains, and fresh fruits and vegetables.  Limit foods that are high in fat and processed sugars, such as fried or sweet foods. Activity  Return to your normal activities as told by your health care provider. Ask your health care provider what activities are safe for you.  Do not lift anything that is heavier than 10 lb (4.5 kg), or the limit that you are told, until your health care provider says that it is safe.  Avoid intense physical activity for as long as told by your health care provider.  Rest as told by your health care provider.  Avoid sitting for a long time without moving. Get up to take short walks every  1-2 hours. This is important to improve blood flow and breathing. Ask for help if you feel weak or unsteady. General instructions   Do not drink alcohol for as long as told by your health care provider. This is especially important if you are taking prescription pain medicines.  Do not take baths, swim, or use a hot tub until your health care provider approves. Ask your health care provider if you may take showers. You may only be allowed to take sponge baths.  If you have a catheter, follow instructions from your health care provider about caring for your catheter and your drainage bag.  Drink enough fluid to keep your urine pale yellow.  Wear compression stockings as told by your health care provider. These stockings help to prevent blood clots and reduce swelling in your legs.  Keep all follow-up visits as told by your health care provider. This is important. ? You will need to be followed closely with regular checks of your bladder and urethra (cystoscopies) to make sure that the cancer does not come back. Contact a health care provider if:  You have pain that gets worse or does not improve with medicine.  You have blood in your urine for more than 2 weeks.  You have cloudy or bad-smelling urine.  You become constipated. Signs of constipation may include having: ? Fewer than three bowel movements in a week. ? Difficulty having a bowel movement. ? Stools that are dry, hard, or larger than normal.  You have  a fever. Get help right away if:  You have: ? Severe pain. ? Bright red blood in your urine. ? Blood clots in your urine. ? A lot of blood in your urine.  Your catheter has been removed and you are not able to urinate.  You have a catheter in place and the catheter is not draining urine. Summary  After your procedure, it is common to have a small amount of blood in your urine, soreness or mild pain from your catheter, and pain in your lower abdomen.  Take  over-the-counter and prescription medicines only as told by your health care provider.  Rest as told by your health care provider. Follow your health care provider's instructions about returning to normal activities. Ask what activities are safe for you.  If you have a catheter, follow instructions from your health care provider about caring for your catheter and your drainage bag.  Get help right away if you cannot urinate, you have severe pain, or you have bright red blood or blood clots in your urine.  You can remove the catheter at home in the morning.      Finish the keflex that was prescribed.    This information is not intended to replace advice given to you by your health care provider. Make sure you discuss any questions you have with your health care provider. Document Revised: 01/13/2018 Document Reviewed: 01/13/2018 Elsevier Patient Education  [place] Instructions  Activity: Get plenty of rest for the remainder of the day. A responsible adult should stay with you for 24 hours following the procedure.  For the next 24 hours, DO NOT: -Drive a car -Paediatric nurse -Drink alcoholic beverages -Take any medication unless instructed by your physician -Make any legal decisions or sign important papers.  Meals: Start with liquid foods such as gelatin or soup. Progress to regular foods as tolerated. Avoid greasy, spicy, heavy foods. If nausea and/or vomiting occur, drink only clear liquids until the nausea and/or vomiting subsides. Call your physician if vomiting continues.  Special Instructions/Symptoms: Your throat may feel dry or sore from the anesthesia or the breathing tube placed in your throat during surgery. If this causes discomfort, gargle with warm salt water. The discomfort should disappear within 24 hours.  If you had a scopolamine patch placed behind your ear for the management of post- operative nausea and/or vomiting:  1.  The medication in the patch is effective for 72 hours, after which it should be removed.  Wrap patch in a tissue and discard in the trash. Wash hands thoroughly with soap and water. 2. You may remove the patch earlier than 72 hours if you experience unpleasant side effects which may include dry mouth, dizziness or visual disturbances. 3. Avoid touching the patch. Wash your hands with soap and water after contact with the patch.

## 2020-02-22 NOTE — Transfer of Care (Signed)
Immediate Anesthesia Transfer of Care Note  Patient: Brian [name]  Procedure(s) Performed: TRANSURETHRAL RESECTION OF BLADDER TUMOR WITH   GEMCITABINE (N/A Bladder) BIOPSY TRANSRECTAL ULTRASONIC PROSTATE (TUBP) (N/A Prostate)  Patient Location: PACU  Anesthesia Type:General  Level of Consciousness: awake, alert  and oriented  Airway & Oxygen Therapy: Patient Spontanous Breathing and Patient connected to nasal cannula oxygen  Post-op Assessment: Report given to RN and Post -op Vital signs reviewed and stable  Post vital signs: Reviewed and stable  Last Vitals:  Vitals Value Taken Time  BP 157/114 02/22/20 1031  Temp    Pulse 85 02/22/20 1035  Resp 17 02/22/20 1035  SpO2 96 % 02/22/20 1035  Vitals shown include unvalidated device data.  Last Pain:  Vitals:   02/22/20 0803  PainSc: 0-No pain      Patients Stated Pain Goal: 5 (80/22/33 6122)  Complications: No complications documented.

## 2020-02-22 NOTE — Anesthesia Procedure Notes (Signed)
Procedure Name: LMA Insertion Date/Time: 02/22/2020 9:33 AM Performed by: Bishop Vanderwerf D, CRNA Pre-anesthesia Checklist: Patient identified, Emergency Drugs available, Suction available and Patient being monitored Patient Re-evaluated:Patient Re-evaluated prior to induction Oxygen Delivery Method: Circle system utilized Preoxygenation: Pre-oxygenation with 100% oxygen Induction Type: IV induction Ventilation: Mask ventilation without difficulty LMA: LMA inserted LMA Size: 4.0 Tube type: Oral Number of attempts: 1 Placement Confirmation: positive ETCO2 and breath sounds checked- equal and bilateral Tube secured with: Tape Dental Injury: Teeth and Oropharynx as per pre-operative assessment

## 2020-02-22 NOTE — Interval H&P Note (Signed)
History and Physical Interval Note:  He had several questions about the procedure and potential findings which answered to his satisfaction.   02/22/2020 9:07 AM  Nanci Pina  has presented today for surgery, with the diagnosis of BLADDER TUMOR ELEVATED PSA.  The various methods of treatment have been discussed with the patient and family. After consideration of risks, benefits and other options for treatment, the patient has consented to  Procedure(s): TRANSURETHRAL RESECTION OF BLADDER TUMOR WITH POSSIBLE  GEMCITABINE (N/A) BIOPSY TRANSRECTAL ULTRASONIC PROSTATE (TUBP) (N/A) as a surgical intervention.  The patient's history has been reviewed, patient examined, no change in status, stable for surgery.  I have reviewed the patient's chart and labs.  Questions were answered to the patient's satisfaction.     Irine Seal

## 2020-02-22 NOTE — Op Note (Signed)
Procedure: 1.  Cystoscopy with transurethral resection of 8 mm anterior bladder neck tumor. 2.  Transurethral resection of 3 cm left lateral wall tumor. 3.  Transrectal prostate ultrasound with ultrasound-guided prostate needle biopsy. 4.  Instillation of gemcitabine in PACU.  Preop diagnosis: 1.  Anterior bladder neck bladder tumor. 2.  Left lateral wall bladder tumor.  3.  Elevated PSA.  Postop diagnosis: Same.  Surgeon: Dr. Irine Seal.  Anesthesia: General.  Specimen: 1.  Bladder tumor chips from anterior bladder wall tumor. 2.  Left lateral wall tumor chips. 3.  12 core prostate needle biopsy.  Drain: 66 [place] Foley catheter.  EBL: 5 mL.  Complications: None.  Indications: Brian [name] is a 69 year old male with a history of an elevated PSA and hematuria who was found on office cystoscopy to have an anterior bladder neck lesion and a left lateral wall lesion that are suspicious for urothelial carcinoma.  He is to undergo cystoscopy with transurethral resection of the lesions and a prostate biopsy with ultrasound guidance and may require gemcitabine instillation as well.  Procedure: He was given Rocephin and gentamicin preoperatively.  A general anesthetic was induced.  He was placed in lithotomy position and fitted with PAS hose.  His perineum and genitalia were prepped with Betadine solution he was draped in usual sterile fashion.  The 31 French continuous-flow resectoscope sheath was placed with the aid of visual obturator.  There is minimal narrowing of the meatus but the scope passed.  Urethra was otherwise unremarkable.  The external sphincter was intact.  The prostatic urethra was approximately 3 to 4 cm in length with bilobar hyperplasia and obstruction.  Examination of the bladder demonstrated mild trabeculation.  Ureteral orifices were in the normal anatomic position.  There was a papillary lesion approximately 8 mm in size at the anterior bladder neck and a an erythematous  lesion on the left anterior bladder wall there was approximately 3 cm in greatest diameter and appeared somewhat sessile with only minimal papillary component.  After initial inspection the Beatrix Fetters handle was passed through the scope and was fitted with a bipolar loop and the 30 degree lens.  Saline was used as the irrigant.  The patient's prostate was somewhat fixed and he had a tightly obese abdomen and a large amount of perineal fat which made angulation of the scope difficult.  Eventually paralysis was required to the anesthesia to aid performance of the resection.  The lesion at the anterior bladder neck was resected and the base was cauterized.  I then resected the left anterior bladder wall lesion with the aid of external pressure from the scrub tech and was able to resected down to muscle.  The resection appeared complete.  The tumor bed was fulgurated along with a rim of surrounding mucosa.  The bladder was evacuated free of chips.  The initial specimen from the anterior bladder neck had been collected separately and then the left lateral wall lesion chips were retrieved.  There was some bleeding at the posterior bladder neck proximal prostate from the instrumentation that I fulgurated.  The scope was then removed and a 20 [place] Foley catheter was inserted.  The balloon was filled with 10 mL of sterile fluid.  I then inserted the transrectal ultrasound probe and image the prostate.  The seminal vesicles were unremarkable.  The prostate had a volume of 74 mL without a middle lobe.  There was a normal-appearing peripheral zone without hypoechoic lesions.  There was one hypoechoic lesion in  the mid transitional zone on the right.  There were calcifications at the junction of peripheral and transitional zone primarily on the right at the mid to base of the prostate.  After completion of the initial scan a 12 core biopsy was performed in the usual configuration with care taken to include the  hypoechoic area in the transitional zone.  Once the biopsies had been completed the probe was removed.  There was mild bleeding so a Gelfoam roll was placed to aid hemostasis.  He was taken down from lithotomy position, his anesthetic was reversed and he was moved to the recovery room in stable condition.  In the recovery room, his bladder was instilled with 2000 mg of gemcitabine and 50 mL of diluent.  This was left indwelling for 1 hour and then his bladder was drained.  He will be discharged home with the catheter.  There were no complications.

## 2020-02-22 NOTE — Anesthesia Preprocedure Evaluation (Signed)
Anesthesia Evaluation  Patient identified by MRN, date of birth, ID band Patient awake    Reviewed: Allergy & Precautions, NPO status , Patient's Chart, lab work & pertinent test results  Airway Mallampati: II  TM Distance: >3 FB Neck ROM: Full    Dental   Pulmonary Current Smoker,    Pulmonary exam normal        Cardiovascular hypertension, Pt. on medications Normal cardiovascular exam+ dysrhythmias Atrial Fibrillation      Neuro/Psych Anxiety    GI/Hepatic GERD  Medicated and Controlled,  Endo/Other    Renal/GU      Musculoskeletal   Abdominal   Peds  Hematology   Anesthesia Other Findings   Reproductive/Obstetrics                             Anesthesia Physical Anesthesia Plan  ASA: III  Anesthesia Plan: General   Post-op Pain Management:    Induction: Intravenous  PONV Risk Score and Plan: 1 and Ondansetron  Airway Management Planned: Oral ETT  Additional Equipment:   Intra-op Plan:   Post-operative Plan: Extubation in OR  Informed Consent: I have reviewed the patients History and Physical, chart, labs and discussed the procedure including the risks, benefits and alternatives for the proposed anesthesia with the patient or authorized representative who has indicated his/her understanding and acceptance.       Plan Discussed with: CRNA and Surgeon  Anesthesia Plan Comments:         Anesthesia Quick Evaluation

## 2020-02-22 NOTE — Anesthesia Postprocedure Evaluation (Signed)
Anesthesia Post Note  Patient: Brian [name]  Procedure(s) Performed: TRANSURETHRAL RESECTION OF BLADDER TUMOR WITH   GEMCITABINE (N/A Bladder) BIOPSY TRANSRECTAL ULTRASONIC PROSTATE (TUBP) (N/A Prostate)     Patient location during evaluation: PACU Anesthesia Type: General Level of consciousness: awake and alert Pain management: pain level controlled Vital Signs Assessment: post-procedure vital signs reviewed and stable Respiratory status: spontaneous breathing, nonlabored ventilation, respiratory function stable and patient connected to nasal cannula oxygen Cardiovascular status: blood pressure returned to baseline and stable Postop Assessment: no apparent nausea or vomiting Anesthetic complications: no   No complications documented.  Last Vitals:  Vitals:   02/22/20 1230 02/22/20 1307  BP: 132/69 120/80  Pulse: 79 85  Resp: 18 18  Temp: 36.7 C 36.7 C  SpO2: 94% 95%    Last Pain:  Vitals:   02/22/20 1307  PainSc: 4                  Kymber Kosar DAVID

## 2020-02-23 ENCOUNTER — Encounter (HOSPITAL_BASED_OUTPATIENT_CLINIC_OR_DEPARTMENT_OTHER): Payer: Self-pay | Admitting: Urology

## 2020-02-26 LAB — SURGICAL PATHOLOGY

## 2020-03-06 DIAGNOSIS — R311 Benign essential microscopic hematuria: Secondary | ICD-10-CM | POA: Diagnosis not present

## 2020-03-06 DIAGNOSIS — C673 Malignant neoplasm of anterior wall of bladder: Secondary | ICD-10-CM | POA: Diagnosis not present

## 2020-03-06 DIAGNOSIS — R972 Elevated prostate specific antigen [PSA]: Secondary | ICD-10-CM | POA: Diagnosis not present

## 2020-03-06 DIAGNOSIS — C672 Malignant neoplasm of lateral wall of bladder: Secondary | ICD-10-CM | POA: Diagnosis not present

## 2020-03-11 ENCOUNTER — Other Ambulatory Visit: Payer: Self-pay | Admitting: Urology

## 2020-03-27 ENCOUNTER — Other Ambulatory Visit: Payer: Self-pay

## 2020-03-27 ENCOUNTER — Encounter (HOSPITAL_BASED_OUTPATIENT_CLINIC_OR_DEPARTMENT_OTHER): Payer: Self-pay | Admitting: Urology

## 2020-03-27 NOTE — Progress Notes (Signed)
Spoke w/ via phone for pre-op interview---pt  Lab needs dos----     I stat 8           COVID test ------03-29-2020 1000 Arrive at -------830 am 04-02-2020 NPO after MN NO Solid Food.  Clear liquids from MN until---730 am then npo Medications to take morning of surgery -----nexium Diabetic medication -----n/a Patient Special Instructions -----none Pre-Op special Istructions -----none Patient verbalized understanding of instructions that were given at this phone interview. Patient denies shortness of breath, chest pain, fever, cough at this phone interview  .Anesthesia Review: no, had cardiac clearance michele lenza pa 02-21-2020 for 02-22-2020 surgery at [facility]  PCP:dr steven myers Cardiologist : dr Jerrilyn Cairo [id number] epic Chest x-ray :none EKG :02-21-2020 epic Echo :none Stress test:none Cardiac Cath : none Activity level: climbs stairs without problems Sleep Study/ CPAP :none Fasting Blood Sugar :      / Checks Blood Sugar -- times a day:  n/a Blood Thinner/ Instructions /Last Dose:n/a ASA / Instructions/ Last Dospt stopping 81 mg aspirin 5 days before surgery per dr Jeffie Pollock

## 2020-03-29 ENCOUNTER — Other Ambulatory Visit (HOSPITAL_COMMUNITY)
Admission: RE | Admit: 2020-03-29 | Discharge: 2020-03-29 | Disposition: A | Discharge status: Home / Self Care | Payer: Medicare Other | Source: Ambulatory Visit | Attending: Urology | Admitting: Urology

## 2020-03-29 DIAGNOSIS — Z01812 Encounter for preprocedural laboratory examination: Secondary | ICD-10-CM | POA: Diagnosis not present

## 2020-03-29 DIAGNOSIS — Z20822 Contact with and (suspected) exposure to covid-19: Secondary | ICD-10-CM | POA: Insufficient documentation

## 2020-03-29 LAB — SARS CORONAVIRUS 2 (TAT 6-24 HRS): SARS Coronavirus 2: NEGATIVE

## 2020-04-01 NOTE — H&P (Signed)
I have bladder cancer that has been treated.  HPI: Brian [name] is a 69 year-old male established patient who is here for follow-up of bladder cancer treatment.    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 He got Gemcitabine. He is voiding well with an IPSS of 3.      CC/HPI: My PSA is elevated above the normal range.     03/06/20: Brian [name] returns today in f/u from a prostate [place] and biopsy and TURBT. The prostate biopsy was negative.   GU Hx: Brian [name] returns in f/u for his history of an elevated PSA, e. coli UTI and BPH with BOO. He remains on cephalexin for suppression but his UA is still abnormal today with pyuria and hematuria. He had a CT that showed no upper tract abnormalities but the bladder was not clearly seen because of artifact. His LUTS have markedly improved with an IPSS of 13 on tamsulsoin and cephalexin which is down from 31. He has no further severe dysuria but has some mild dysuria and darker urine since he came off of the cephalexin TID. His PSA was still elevated at 5.99 with a 7% f/t ratio.    GU Hx: Brian [name] is a 69 yo male who is sent by Dr. Orpah Melter for an elevated PSA of 6.02 on 12/06/19. He had an e. coli UTI on culture on 10/27/19. He has been treated with cephalexin x 2 and doxycycline x 2 in the last month and a half. He took his last doxycycline last night. He has been having hematuria and has passed a couple of clots. He has dysuria and marked LUTS with nocturia x 5, urgency, frequency, a weak stream, straining, intermittency and a sensation of incomplete emptying. His IPSS is 31. He has a prior history of stones remotely. He has had no GU surgery. He has had no other UTI's. He has had no flank pain.    ALLERGIES: None   MEDICATIONS: Aspirin 81 mg tablet,chewable  Tamsulosin Hcl 0.4 mg capsule 1 capsule PO Daily  Furosemide 40 mg tablet  Humira 40 mg/0.8 ml syringe kit  Methotrexate 2.5 mg tablet   Nexium 24Hr  Stool Softner     GU PSH: Bladder Instill AntiCA Agent - 02/22/2020 Cystoscopy - 02/05/2020 Cystoscopy TURBT 2-5 cm - 02/22/2020 Locm 300-399Mg/Ml Iodine,1Ml - 12/22/2019 Prostate Needle Biopsy - 02/22/2020       PSH Notes: rt hip replacement 2019   NON-GU PSH: Hip Replacement, Right - about 2019, Left - about 2019, Right - about 2019, Bilateral - about 2019 Knee replacement, Left - about 2019     GU PMH: Bladder tumor/neoplasm, He has a necrotic neoplasm at the anterior bladder neck and a somewhat sessile appearing lesion on the left anterior lateral wall with 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 He is going to need a TURBT and I reviewed the risks of bleeding, infection, injury to urinary structures. Need for a catheter and secondary resections, thrombotic events and anesthetic complications. - 02/05/2020 Elevated PSA, His PSA remained elevated with a low free to total ratio. He is going to need a prostate [place] and biopsy at the time of his TURBT. - 02/05/2020, His PSA was up to 6.02 and his prostate is firm but he has had a recent UTI which can impact the PSA and prostate exam. I have repeated the PSA today. He will likely need a prostate [place] and biopsy in the future but  I need to make sure he is free of infection. , - 12/21/2019 Urinary Urgency - 02/05/2020 Acute Cystitis/UTI, He has had a recent UTI and his urine suggests possible infection. UTI+ today. - 12/21/2019 BPH w/LUTS, He has severe LUTS which could be from BPH with BOO but he has had a recent UTI and has hematuria with a history of stones. - 12/21/2019 Dysuria, UTI+ today. I will treat based on the culture. - 12/21/2019 Gross hematuria, He has had some intermittent gross hematuria. he will have a CT hematuria study and a possible cystoscopy. - 12/21/2019      PMH Notes: cardiac ablation 2016   NON-GU PMH: Atherosclerosis of aorta Atrial Fibrillation Hypertension Iron deficiency anemia, unspecified Rheumatoid  Arthritis    FAMILY HISTORY: Breast Cancer - Mother Lung Cancer - Father Prostate Cancer - Uncle Type 2 Diabetes - Sister   SOCIAL HISTORY: Marital Status: Divorced Preferred Language: English; Race: White Current Smoking Status: Patient smokes. Has smoked since 11/28/1979. Smokes 1/2 pack per day.   Tobacco Use Assessment Completed: Used Tobacco in last 30 days? Does not drink caffeine. Patient's occupation is/was retired.     Notes: 2 daughters 1 son   REVIEW OF SYSTEMS:    GU Review Male:   Patient reports hard to postpone urination, burning/ pain with urination, get up at night to urinate, leakage of urine, and erection problems. Patient denies frequent urination, stream starts and stops, trouble starting your stream, have to strain to urinate , and penile pain.  Gastrointestinal (Upper):   Patient denies nausea, vomiting, and indigestion/ heartburn.  Gastrointestinal (Lower):   Patient denies diarrhea and constipation.  Constitutional:   Patient denies fever, night sweats, weight loss, and fatigue.  Skin:   Patient denies skin rash/ lesion and itching.  Eyes:   Patient denies blurred vision and double vision.  Ears/ Nose/ Throat:   Patient denies sore throat and sinus problems.  Hematologic/Lymphatic:   Patient denies swollen glands and easy bruising.  Cardiovascular:   Patient denies leg swelling and chest pains.  Respiratory:   Patient reports shortness of breath. Patient denies cough.  Endocrine:   Patient denies excessive thirst.  Musculoskeletal:   Patient reports joint pain. Patient denies back pain.  Neurological:   Patient denies headaches and dizziness.  Psychologic:   Patient denies depression and anxiety.   VITAL SIGNS:      03/06/2020 11:19 AM  Weight 261 lb / 118.39 kg  Height 70 in / 177.8 cm  BP 120/77 mmHg  Pulse 87 /min  Temperature 97.1 F / 36.1 C  BMI 37.4 kg/m   Complexity of Data:   12/22/19  PSA  Total PSA 5.99 ng/mL  Free PSA 0.42 ng/mL  %  Free PSA 7 % PSA    PROCEDURES:          Urinalysis w/Scope Dipstick Dipstick Cont'd Micro  Color: Yellow Bilirubin: Neg mg/dL WBC/hpf: 10 - 20/hpf  Appearance: Clear Ketones: Neg mg/dL RBC/hpf: 40 - 60/hpf  Specific Gravity: 1.025 Blood: 2+ ery/uL Bacteria: Few (10-25/hpf)  pH: 6.0 Protein: 1+ mg/dL Cystals: NS (Not Seen)  Glucose: Neg mg/dL Urobilinogen: 0.2 mg/dL Casts: NS (Not Seen)    Nitrites: Neg Trichomonas: Not Present    Leukocyte Esterase: 2+ leu/uL Mucous: Not Present      Epithelial Cells: 0 - 5/hpf      Yeast: NS (Not Seen)      Sperm: Not Present    ASSESSMENT:      ICD-10 Details  1 GU:   Elevated PSA - R97.20 Chronic, Stable - His prostate biopsy demonstrated chronic inflammation but no cancer.   2   Bladder Cancer Lateral - C67.2 Chronic, Threat to Bodily Function - He has T1 HG NMIBC and will need a restaging resection. I have reviewed the risks of the procedure.   3   Bladder Cancer Anterior - C67.3 Chronic, Threat to Bodily Function - the anterior tumor was HG and no invasion was identified.   4   Microscopic hematuria - R31.1 Minor - He has microhematuria and pyuria and I will get a culture to make sure he doesn't have infection.      PLAN:           Orders Labs Urine Culture          Schedule Return Visit/Planned Activity: Next Available Appointment - Schedule Surgery  Procedure: Unspecified Date - Cystoscopy TURBT 2-5 cm - 19914 Notes: Next available.           Document Letter(s):  Created for Patient: Clinical Summary         Notes:   CC: Dr. Orpah Melter.         Next Appointment:      Next Appointment: 03/21/2020 09:15 AM    Appointment Type: Office Visit Established Patient    Location: [facility] [phone]    Provider: J[facility], NP    Reason for Visit: 3 mo recheck

## 2020-04-02 ENCOUNTER — Ambulatory Visit (HOSPITAL_BASED_OUTPATIENT_CLINIC_OR_DEPARTMENT_OTHER): Payer: Medicare Other | Admitting: Anesthesiology

## 2020-04-02 ENCOUNTER — Encounter (HOSPITAL_BASED_OUTPATIENT_CLINIC_OR_DEPARTMENT_OTHER)
Admission: RE | Disposition: A | Discharge status: Home / Self Care | Payer: Self-pay | Source: Home / Self Care | Attending: Urology

## 2020-04-02 ENCOUNTER — Ambulatory Visit (HOSPITAL_BASED_OUTPATIENT_CLINIC_OR_DEPARTMENT_OTHER)
Admission: RE | Admit: 2020-04-02 | Discharge: 2020-04-02 | Disposition: A | Discharge status: Home / Self Care | Payer: Medicare Other | Attending: Urology | Admitting: Urology

## 2020-04-02 ENCOUNTER — Encounter (HOSPITAL_BASED_OUTPATIENT_CLINIC_OR_DEPARTMENT_OTHER): Payer: Self-pay | Admitting: Urology

## 2020-04-02 ENCOUNTER — Other Ambulatory Visit: Payer: Self-pay

## 2020-04-02 DIAGNOSIS — I110 Hypertensive heart disease with heart failure: Secondary | ICD-10-CM | POA: Diagnosis not present

## 2020-04-02 DIAGNOSIS — R3129 Other microscopic hematuria: Secondary | ICD-10-CM | POA: Diagnosis not present

## 2020-04-02 DIAGNOSIS — C679 Malignant neoplasm of bladder, unspecified: Secondary | ICD-10-CM | POA: Diagnosis not present

## 2020-04-02 DIAGNOSIS — Z803 Family history of malignant neoplasm of breast: Secondary | ICD-10-CM | POA: Insufficient documentation

## 2020-04-02 DIAGNOSIS — N401 Enlarged prostate with lower urinary tract symptoms: Secondary | ICD-10-CM | POA: Diagnosis not present

## 2020-04-02 DIAGNOSIS — I4891 Unspecified atrial fibrillation: Secondary | ICD-10-CM | POA: Insufficient documentation

## 2020-04-02 DIAGNOSIS — Z96641 Presence of right artificial hip joint: Secondary | ICD-10-CM | POA: Diagnosis not present

## 2020-04-02 DIAGNOSIS — M069 Rheumatoid arthritis, unspecified: Secondary | ICD-10-CM | POA: Diagnosis not present

## 2020-04-02 DIAGNOSIS — I4819 Other persistent atrial fibrillation: Secondary | ICD-10-CM | POA: Diagnosis not present

## 2020-04-02 DIAGNOSIS — Z801 Family history of malignant neoplasm of trachea, bronchus and lung: Secondary | ICD-10-CM | POA: Insufficient documentation

## 2020-04-02 DIAGNOSIS — Z8042 Family history of malignant neoplasm of prostate: Secondary | ICD-10-CM | POA: Diagnosis not present

## 2020-04-02 DIAGNOSIS — I5033 Acute on chronic diastolic (congestive) heart failure: Secondary | ICD-10-CM | POA: Diagnosis not present

## 2020-04-02 DIAGNOSIS — F17210 Nicotine dependence, cigarettes, uncomplicated: Secondary | ICD-10-CM | POA: Diagnosis not present

## 2020-04-02 DIAGNOSIS — D494 Neoplasm of unspecified behavior of bladder: Secondary | ICD-10-CM | POA: Diagnosis not present

## 2020-04-02 DIAGNOSIS — N3090 Cystitis, unspecified without hematuria: Secondary | ICD-10-CM | POA: Diagnosis not present

## 2020-04-02 DIAGNOSIS — Z96652 Presence of left artificial knee joint: Secondary | ICD-10-CM | POA: Insufficient documentation

## 2020-04-02 DIAGNOSIS — I10 Essential (primary) hypertension: Secondary | ICD-10-CM | POA: Insufficient documentation

## 2020-04-02 HISTORY — PX: TRANSURETHRAL RESECTION OF BLADDER TUMOR: SHX2575

## 2020-04-02 LAB — POCT I-STAT, CHEM 8
BUN: 15 mg/dL (ref 8–23)
Calcium, Ion: 1.18 mmol/L (ref 1.15–1.40)
Chloride: 101 mmol/L (ref 98–111)
Creatinine, Ser: 0.9 mg/dL (ref 0.61–1.24)
Glucose, Bld: 106 mg/dL — ABNORMAL HIGH (ref 70–99)
HCT: 46 % (ref 39.0–52.0)
Hemoglobin: 15.6 g/dL (ref 13.0–17.0)
Potassium: 4.6 mmol/L (ref 3.5–5.1)
Sodium: 142 mmol/L (ref 135–145)
TCO2: 30 mmol/L (ref 22–32)

## 2020-04-02 SURGERY — TURBT (TRANSURETHRAL RESECTION OF BLADDER TUMOR)
Anesthesia: General | Site: Bladder

## 2020-04-02 MED ORDER — FENTANYL CITRATE (PF) 100 MCG/2ML IJ SOLN
INTRAMUSCULAR | Status: DC | PRN
Start: 2020-04-02 — End: 2020-04-02
  Administered 2020-04-02: 100 ug via INTRAVENOUS
  Administered 2020-04-02 (×2): 50 ug via INTRAVENOUS

## 2020-04-02 MED ORDER — PROPOFOL 10 MG/ML IV BOLUS
INTRAVENOUS | Status: DC | PRN
  Administered 2020-04-02: 150 mg via INTRAVENOUS

## 2020-04-02 MED ORDER — MORPHINE SULFATE (PF) 4 MG/ML IV SOLN: 2.0000 mg | INTRAVENOUS | Status: DC | PRN

## 2020-04-02 MED ORDER — ACETAMINOPHEN 325 MG PO TABS: 650.0000 mg | ORAL_TABLET | ORAL | Status: DC | PRN

## 2020-04-02 MED ORDER — OXYCODONE HCL 5 MG PO TABS: 5.0000 mg | ORAL_TABLET | ORAL | Status: DC | PRN

## 2020-04-02 MED ORDER — PROPOFOL 10 MG/ML IV BOLUS
INTRAVENOUS | Status: AC
  Filled 2020-04-02: qty 40

## 2020-04-02 MED ORDER — SODIUM CHLORIDE 0.9 % IR SOLN
Status: DC | PRN
  Administered 2020-04-02: 6000 mL via INTRAVESICAL

## 2020-04-02 MED ORDER — SUGAMMADEX SODIUM 200 MG/2ML IV SOLN
INTRAVENOUS | Status: DC | PRN
  Administered 2020-04-02: 200 mg via INTRAVENOUS

## 2020-04-02 MED ORDER — LIDOCAINE 2% (20 MG/ML) 5 ML SYRINGE
INTRAMUSCULAR | Status: AC
  Filled 2020-04-02: qty 5

## 2020-04-02 MED ORDER — LACTATED RINGERS IV SOLN
INTRAVENOUS | Status: DC
  Administered 2020-04-02: 1000 mL via INTRAVENOUS

## 2020-04-02 MED ORDER — ONDANSETRON HCL 4 MG/2ML IJ SOLN: 4.0000 mg | Freq: Once | INTRAMUSCULAR | Status: DC | PRN

## 2020-04-02 MED ORDER — ROCURONIUM BROMIDE 10 MG/ML (PF) SYRINGE
PREFILLED_SYRINGE | INTRAVENOUS | Status: DC | PRN
  Administered 2020-04-02: 70 mg via INTRAVENOUS

## 2020-04-02 MED ORDER — SODIUM CHLORIDE 0.9% FLUSH: 3.0000 mL | Freq: Two times a day (BID) | INTRAVENOUS | Status: DC

## 2020-04-02 MED ORDER — ONDANSETRON HCL 4 MG/2ML IJ SOLN
INTRAMUSCULAR | Status: AC
  Filled 2020-04-02: qty 2

## 2020-04-02 MED ORDER — SODIUM CHLORIDE 0.9% FLUSH: 3.0000 mL | INTRAVENOUS | Status: DC | PRN

## 2020-04-02 MED ORDER — ONDANSETRON HCL 4 MG/2ML IJ SOLN
INTRAMUSCULAR | Status: DC | PRN
  Administered 2020-04-02: 4 mg via INTRAVENOUS

## 2020-04-02 MED ORDER — DEXAMETHASONE SODIUM PHOSPHATE 10 MG/ML IJ SOLN
INTRAMUSCULAR | Status: DC | PRN
  Administered 2020-04-02: 10 mg via INTRAVENOUS

## 2020-04-02 MED ORDER — CEFAZOLIN SODIUM-DEXTROSE 2-4 GM/100ML-% IV SOLN
INTRAVENOUS | Status: AC
  Filled 2020-04-02: qty 100

## 2020-04-02 MED ORDER — ROCURONIUM BROMIDE 10 MG/ML (PF) SYRINGE
PREFILLED_SYRINGE | INTRAVENOUS | Status: AC
  Filled 2020-04-02: qty 10

## 2020-04-02 MED ORDER — FENTANYL CITRATE (PF) 100 MCG/2ML IJ SOLN
25.0000 ug | INTRAMUSCULAR | Status: DC | PRN
  Administered 2020-04-02: 50 ug via INTRAVENOUS

## 2020-04-02 MED ORDER — OXYCODONE HCL 5 MG PO TABS
ORAL_TABLET | ORAL | Status: AC
  Filled 2020-04-02: qty 1

## 2020-04-02 MED ORDER — OXYCODONE HCL 5 MG/5ML PO SOLN: 5.0000 mg | Freq: Once | ORAL | Status: AC | PRN

## 2020-04-02 MED ORDER — LIDOCAINE 2% (20 MG/ML) 5 ML SYRINGE
INTRAMUSCULAR | Status: DC | PRN
  Administered 2020-04-02: 80 mg via INTRAVENOUS

## 2020-04-02 MED ORDER — HYDROCODONE-ACETAMINOPHEN 5-325 MG PO TABS
1.0000 | ORAL_TABLET | Freq: Four times a day (QID) | ORAL | 0 refills | Status: AC | PRN
Start: 2020-04-02 — End: 2021-04-02

## 2020-04-02 MED ORDER — SODIUM CHLORIDE 0.9 % IV SOLN: 250.0000 mL | INTRAVENOUS | Status: DC | PRN

## 2020-04-02 MED ORDER — DEXAMETHASONE SODIUM PHOSPHATE 10 MG/ML IJ SOLN
INTRAMUSCULAR | Status: AC
  Filled 2020-04-02: qty 1

## 2020-04-02 MED ORDER — FENTANYL CITRATE (PF) 100 MCG/2ML IJ SOLN
INTRAMUSCULAR | Status: AC
  Filled 2020-04-02: qty 2

## 2020-04-02 MED ORDER — OXYCODONE HCL 5 MG PO TABS
5.0000 mg | ORAL_TABLET | Freq: Once | ORAL | Status: AC | PRN
  Administered 2020-04-02: 5 mg via ORAL

## 2020-04-02 MED ORDER — ACETAMINOPHEN 325 MG RE SUPP: 650.0000 mg | RECTAL | Status: DC | PRN

## 2020-04-02 MED ORDER — FENTANYL CITRATE (PF) 250 MCG/5ML IJ SOLN
INTRAMUSCULAR | Status: AC
  Filled 2020-04-02: qty 5

## 2020-04-02 MED ORDER — CEFAZOLIN SODIUM-DEXTROSE 2-4 GM/100ML-% IV SOLN
2.0000 g | INTRAVENOUS | Status: AC
  Administered 2020-04-02: 2 g via INTRAVENOUS

## 2020-04-02 SURGICAL SUPPLY — 26 items
BAG DRAIN URO-CYSTO SKYTR STRL (DRAIN) ×3 IMPLANT
BAG DRN RND TRDRP ANRFLXCHMBR (UROLOGICAL SUPPLIES)
BAG DRN UROCATH (DRAIN) ×1
BAG URINE DRAIN 2000ML AR STRL (UROLOGICAL SUPPLIES) IMPLANT
BAG URINE LEG 500ML (DRAIN) IMPLANT
CATH FOLEY 2WAY SLVR  5CC 20FR (CATHETERS) ×2
CATH FOLEY 2WAY SLVR  5CC 22FR (CATHETERS)
CATH FOLEY 2WAY SLVR 5CC 20FR (CATHETERS) ×1 IMPLANT
CATH FOLEY 2WAY SLVR 5CC 22FR (CATHETERS) IMPLANT
CLOTH BEACON ORANGE TIMEOUT ST (SAFETY) ×3 IMPLANT
ELECT REM PT RETURN 9FT ADLT (ELECTROSURGICAL)
ELECTRODE REM PT RTRN 9FT ADLT (ELECTROSURGICAL) IMPLANT
GLOVE SURG SS PI 8.0 STRL IVOR (GLOVE) ×3 IMPLANT
GOWN STRL REUS W/TWL XL LVL3 (GOWN DISPOSABLE) ×3 IMPLANT
HOLDER FOLEY CATH W/STRAP (MISCELLANEOUS) ×3 IMPLANT
IV NS IRRIG 3000ML ARTHROMATIC (IV SOLUTION) ×3 IMPLANT
KIT TURNOVER CYSTO (KITS) ×3 IMPLANT
LOOP CUT BIPOLAR 24F LRG (ELECTROSURGICAL) ×3 IMPLANT
MANIFOLD NEPTUNE II (INSTRUMENTS) ×3 IMPLANT
PACK CYSTO (CUSTOM PROCEDURE TRAY) ×3 IMPLANT
PLUG CATH AND CAP STER (CATHETERS) IMPLANT
SYR TOOMEY IRRIG 70ML (MISCELLANEOUS)
SYRINGE TOOMEY IRRIG 70ML (MISCELLANEOUS) IMPLANT
TUBE CONNECTING 12'X1/4 (SUCTIONS) ×1
TUBE CONNECTING 12X1/4 (SUCTIONS) ×2 IMPLANT
TUBING UROLOGY SET (TUBING) ×3 IMPLANT

## 2020-04-02 NOTE — Op Note (Signed)
Procedure: 1.  Cystoscopy with restaging transurethral resection of 2 cm bladder tumor. 2.  Fulguration of bladder neck.  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  Postop diagnosis: Same with bladder neck bleeding.  Surgeon: Dr. Irine Seal.  Anesthesia: General.  Specimen: Resection chips.  Drains: 20 French Foley catheter.  EBL: 5 mL.  Complications: None.  Indications: The patient is a 69 year old male who recently underwent resection of the left anterior lateral bladder wall tumor that was found to be high-grade T1 and reresection is indicated to ensure no residual tumor.  Procedure: He was taken operating room where he was given antibiotics.  A general anesthetic was induced.  He was intubated and paralyzed for the procedure.  He was placed in lithotomy position after being fitted with PAS hose.  His perineum and genitalia were prepped with Betadine solution he was draped in usual sterile fashion.  The 53 French continuous-flow resectoscope sheath was placed the aid of visual obturator.  The visual obturator was replaced with the [facility] handle with the bipolar loop and 30 degree lens.  Saline was used as the irrigant.  He was noted to have some bleeding and a prior fulguration site at the bladder neck.  The bladder had moderate trabeculation with some erythema from the recent procedure.  The actual resection site was very difficult to visualize due to the patient's anatomy with a large perineal fat pad and tight suspensory ligaments the with the aid of the bedside assistant we were able to manipulate the portion of bladder and reviewed the contain the prior resection site.  The resection site was then reresected with an area approximately 2 cm and cautery was used to achieve hemostasis.  The bladder was evacuated free of the resected tissue.  I then had fulgurate several areas of the bladder neck to achieve adequate hemostasis as the extreme  angulation required for the procedure reaggravated bleeding both in the posterior and anterior bladder neck.  Once hemostasis was secured, the scope was removed and a 20 [place] Foley catheter was inserted.  The balloon was filled with 10 mL of sterile fluid.  He was taken down from lithotomy position, his anesthetic was reversed and he was moved recovery in stable condition.  There were no complications.

## 2020-04-02 NOTE — Anesthesia Preprocedure Evaluation (Addendum)
Anesthesia Evaluation  Patient identified by MRN, date of birth, ID band Patient awake    Reviewed: Allergy & Precautions, NPO status , Patient's Chart, lab work & pertinent test results  Airway Mallampati: II  TM Distance: >3 FB Neck ROM: Full    Dental  (+) Lower Dentures, Upper Dentures   Pulmonary Current Smoker,    Pulmonary exam normal        Cardiovascular hypertension, Pt. on medications Normal cardiovascular exam+ dysrhythmias (s/p ablation) Atrial Fibrillation   EKG 02/21/20: NSR   ECHO: 2017 - Left ventricle: The cavity size was normal. Systolic function was  normal. The estimated ejection fraction was in the range of 55%  to 60%. Wall motion was normal; there were no regional wall  motion abnormalities. Left ventricular diastolic function  parameters were normal.  - Aortic valve: There was trivial regurgitation. Valve area (VTI):  1.79 cm^2. Valve area (Vmax): 2.01 cm^2. Valve area (Vmean): 2  cm^2.  - Atrial septum: There was increased thickness of the septum,  consistent with lipomatous hypertrophy.    Neuro/Psych Anxiety TIA   GI/Hepatic Neg liver ROS, GERD  Medicated and Controlled,  Endo/Other  BMI 37  Renal/GU negative Renal ROS   Bladder tumor    Musculoskeletal  (+) Arthritis , Osteoarthritis and Rheumatoid disorders,    Abdominal   Peds  Hematology negative hematology ROS (+)   Anesthesia Other Findings   Reproductive/Obstetrics negative OB ROS                            Anesthesia Physical  Anesthesia Plan  ASA: III  Anesthesia Plan: General   Post-op Pain Management:    Induction: Intravenous  PONV Risk Score and Plan: 1 and Ondansetron and Treatment may vary due to age or medical condition  Airway Management Planned: Oral ETT  Additional Equipment:   Intra-op Plan:   Post-operative Plan: Extubation in OR  Informed Consent: I have  reviewed the patients History and Physical, chart, labs and discussed the procedure including the risks, benefits and alternatives for the proposed anesthesia with the patient or authorized representative who has indicated his/her understanding and acceptance.       Plan Discussed with: CRNA, Surgeon and Anesthesiologist  Anesthesia Plan Comments:        Anesthesia Quick Evaluation

## 2020-04-02 NOTE — Anesthesia Postprocedure Evaluation (Signed)
Anesthesia Post Note  Patient: Brian [name]  Procedure(s) Performed: RESTAGING TRANSURETHRAL RESECTION OF BLADDER TUMOR (TURBT) (N/A Bladder)     Patient location during evaluation: PACU Anesthesia Type: General Level of consciousness: sedated Pain management: pain level controlled Vital Signs Assessment: post-procedure vital signs reviewed and stable Respiratory status: spontaneous breathing and respiratory function stable Cardiovascular status: stable Postop Assessment: no apparent nausea or vomiting Anesthetic complications: no   No complications documented.  Last Vitals:  Vitals:   04/02/20 1145 04/02/20 1146  BP: 127/70   Pulse: 76   Resp: 20   Temp: 36.6 C   SpO2: (!) 88% 95%    Last Pain:  Vitals:   04/02/20 1130  TempSrc:   PainSc: 5                  Candra R Annalynne Ibanez

## 2020-04-02 NOTE — Anesthesia Procedure Notes (Signed)
Procedure Name: Intubation Date/Time: 04/02/2020 10:15 AM Performed by: Rogers [facility], CRNA Pre-anesthesia Checklist: Patient identified, Emergency Drugs available, Suction available and Patient being monitored Patient Re-evaluated:Patient Re-evaluated prior to induction Oxygen Delivery Method: Circle System Utilized Preoxygenation: Pre-oxygenation with 100% oxygen Induction Type: IV induction Ventilation: Mask ventilation without difficulty Laryngoscope Size: Mac and 4 Grade View: Grade I Tube type: Oral Tube size: 7.5 mm Number of attempts: 1 Airway Equipment and Method: Stylet and Oral airway Placement Confirmation: ETT inserted through vocal cords under direct vision,  positive ETCO2 and breath sounds checked- equal and bilateral Tube secured with: Tape Dental Injury: Teeth and Oropharynx as per pre-operative assessment

## 2020-04-02 NOTE — Interval H&P Note (Signed)
History and Physical Interval Note:  04/02/2020 10:04 AM  Brian [name]  has presented today for surgery, with the diagnosis of BLADDER CANCER.  The various methods of treatment have been discussed with the patient and family. After consideration of risks, benefits and other options for treatment, the patient has consented to  Procedure(s) with comments: RESTAGING TRANSURETHRAL RESECTION OF BLADDER TUMOR (TURBT) (N/A) - GENERAL ANESTHESIA WIHT PARALYSIS as a surgical intervention.  The patient's history has been reviewed, patient examined, no change in status, stable for surgery.  I have reviewed the patient's chart and labs.  Questions were answered to the patient's satisfaction.     Irine Seal

## 2020-04-02 NOTE — Discharge Instructions (Addendum)
Transurethral Resection of Bladder Tumor, Care After This sheet gives you information about how to care for yourself after your procedure. Your health care provider may also give you more specific instructions. If you have problems or questions, contact your health care provider. What can I expect after the procedure? After the procedure, it is common to have:  A small amount of blood in your urine for up to 2 weeks.  Soreness or mild pain from your catheter. After your catheter is removed, you may have mild soreness, especially when urinating.  Pain in your lower abdomen. Follow these instructions at home: Medicines   Take over-the-counter and prescription medicines only as told by your health care provider.  If you were prescribed an antibiotic medicine, take it as told by your health care provider. Do not stop taking the antibiotic even if you start to feel better.  Do not drive for 24 hours if you were given a sedative during your procedure.  Ask your health care provider if the medicine prescribed to you: ? Requires you to avoid driving or using heavy machinery. ? Can cause constipation. You may need to take these actions to prevent or treat constipation:  Take over-the-counter or prescription medicines.  Eat foods that are high in fiber, such as beans, whole grains, and fresh fruits and vegetables.  Limit foods that are high in fat and processed sugars, such as fried or sweet foods. Activity  Return to your normal activities as told by your health care provider. Ask your health care provider what activities are safe for you.  Do not lift anything that is heavier than 10 lb (4.5 kg), or the limit that you are told, until your health care provider says that it is safe.  Avoid intense physical activity for as long as told by your health care provider.  Rest as told by your health care provider.  Avoid sitting for a long time without moving. Get up to take short walks every  1-2 hours. This is important to improve blood flow and breathing. Ask for help if you feel weak or unsteady. General instructions   Do not drink alcohol for as long as told by your health care provider. This is especially important if you are taking prescription pain medicines.  Do not take baths, swim, or use a hot tub until your health care provider approves. Ask your health care provider if you may take showers. You may only be allowed to take sponge baths.  If you have a catheter, follow instructions from your health care provider about caring for your catheter and your drainage bag.  Drink enough fluid to keep your urine pale yellow.  Wear compression stockings as told by your health care provider. These stockings help to prevent blood clots and reduce swelling in your legs.  Keep all follow-up visits as told by your health care provider. This is important. ? You will need to be followed closely with regular checks of your bladder and urethra (cystoscopies) to make sure that the cancer does not come back. Contact a health care provider if:  You have pain that gets worse or does not improve with medicine.  You have blood in your urine for more than 2 weeks.  You have cloudy or bad-smelling urine.  You become constipated. Signs of constipation may include having: ? Fewer than three bowel movements in a week. ? Difficulty having a bowel movement. ? Stools that are dry, hard, or larger than normal.  You have   a fever. Get help right away if:  You have: ? Severe pain. ? Bright red blood in your urine. ? Blood clots in your urine. ? A lot of blood in your urine.  Your catheter has been removed and you are not able to urinate.  You have a catheter in place and the catheter is not draining urine. Summary  After your procedure, it is common to have a small amount of blood in your urine, soreness or mild pain from your catheter, and pain in your lower abdomen.  Take  over-the-counter and prescription medicines only as told by your health care provider.  Rest as told by your health care provider. Follow your health care provider's instructions about returning to normal activities. Ask what activities are safe for you.  If you have a catheter, follow instructions from your health care provider about caring for your catheter and your drainage bag.  Get help right away if you cannot urinate, you have severe pain, or you have bright red blood or blood clots in your urine. This information is not intended to replace advice given to you by your health care provider. Make sure you discuss any questions you have with your health care provider. Document Revised: 01/13/2018 Document Reviewed: 01/13/2018 Elsevier Patient Education  2020 [employer]   Post Anesthesia Home Care Instructions  Activity: Get plenty of rest for the remainder of the day. A responsible individual must stay with you for 24 hours following the procedure.  For the next 24 hours, DO NOT: -Drive a car -Operate machinery -Drink alcoholic beverages -Take any medication unless instructed by your physician -Make any legal decisions or sign important papers.  Meals: Start with liquid foods such as gelatin or soup. Progress to regular foods as tolerated. Avoid greasy, spicy, heavy foods. If nausea and/or vomiting occur, drink only clear liquids until the nausea and/or vomiting subsides. Call your physician if vomiting continues.  Special Instructions/Symptoms: Your throat may feel dry or sore from the anesthesia or the breathing tube placed in your throat during surgery. If this causes discomfort, gargle with warm salt water. The discomfort should disappear within 24 hours.  

## 2020-04-02 NOTE — Transfer of Care (Signed)
Immediate Anesthesia Transfer of Care Note  Patient: Brian [name]  Procedure(s) Performed: RESTAGING TRANSURETHRAL RESECTION OF BLADDER TUMOR (TURBT) (N/A Bladder)  Patient Location: PACU  Anesthesia Type:General  Level of Consciousness: awake, alert , oriented and patient cooperative  Airway & Oxygen Therapy: Patient Spontanous Breathing and Patient connected to nasal cannula oxygen  Post-op Assessment: Report given to RN and Post -op Vital signs reviewed and stable  Post vital signs: Reviewed and stable  Last Vitals:  Vitals Value Taken Time  BP 150/74 04/02/20 1054  Temp 36.8 C 04/02/20 1054  Pulse 83 04/02/20 1057  Resp 24 04/02/20 1057  SpO2 95 % 04/02/20 1057  Vitals shown include unvalidated device data.  Last Pain:  Vitals:   04/02/20 0833  TempSrc: Oral         Complications: No complications documented.

## 2020-04-03 ENCOUNTER — Encounter (HOSPITAL_BASED_OUTPATIENT_CLINIC_OR_DEPARTMENT_OTHER): Payer: Self-pay | Admitting: Urology

## 2020-04-03 LAB — SURGICAL PATHOLOGY

## 2020-04-09 DIAGNOSIS — M2550 Pain in unspecified joint: Secondary | ICD-10-CM | POA: Diagnosis not present

## 2020-04-09 DIAGNOSIS — Z6837 Body mass index (BMI) 37.0-37.9, adult: Secondary | ICD-10-CM | POA: Diagnosis not present

## 2020-04-09 DIAGNOSIS — D508 Other iron deficiency anemias: Secondary | ICD-10-CM | POA: Diagnosis not present

## 2020-04-09 DIAGNOSIS — R5382 Chronic fatigue, unspecified: Secondary | ICD-10-CM | POA: Diagnosis not present

## 2020-04-09 DIAGNOSIS — M150 Primary generalized (osteo)arthritis: Secondary | ICD-10-CM | POA: Diagnosis not present

## 2020-04-09 DIAGNOSIS — E669 Obesity, unspecified: Secondary | ICD-10-CM | POA: Diagnosis not present

## 2020-04-09 DIAGNOSIS — M0579 Rheumatoid arthritis with rheumatoid factor of multiple sites without organ or systems involvement: Secondary | ICD-10-CM | POA: Diagnosis not present

## 2020-04-18 DIAGNOSIS — R300 Dysuria: Secondary | ICD-10-CM | POA: Diagnosis not present

## 2020-04-18 DIAGNOSIS — C672 Malignant neoplasm of lateral wall of bladder: Secondary | ICD-10-CM | POA: Diagnosis not present

## 2020-04-18 DIAGNOSIS — C673 Malignant neoplasm of anterior wall of bladder: Secondary | ICD-10-CM | POA: Diagnosis not present

## 2020-04-18 DIAGNOSIS — R972 Elevated prostate specific antigen [PSA]: Secondary | ICD-10-CM | POA: Diagnosis not present

## 2020-05-06 DIAGNOSIS — C673 Malignant neoplasm of anterior wall of bladder: Secondary | ICD-10-CM | POA: Diagnosis not present

## 2020-05-06 DIAGNOSIS — R311 Benign essential microscopic hematuria: Secondary | ICD-10-CM | POA: Diagnosis not present

## 2020-05-13 DIAGNOSIS — Z5111 Encounter for antineoplastic chemotherapy: Secondary | ICD-10-CM | POA: Diagnosis not present

## 2020-05-13 DIAGNOSIS — C673 Malignant neoplasm of anterior wall of bladder: Secondary | ICD-10-CM | POA: Diagnosis not present

## 2020-05-20 DIAGNOSIS — Z5111 Encounter for antineoplastic chemotherapy: Secondary | ICD-10-CM | POA: Diagnosis not present

## 2020-05-20 DIAGNOSIS — C673 Malignant neoplasm of anterior wall of bladder: Secondary | ICD-10-CM | POA: Diagnosis not present

## 2020-05-20 DIAGNOSIS — C672 Malignant neoplasm of lateral wall of bladder: Secondary | ICD-10-CM | POA: Diagnosis not present

## 2020-05-27 ENCOUNTER — Ambulatory Visit: Payer: Medicare Other | Admitting: Cardiology

## 2020-05-27 DIAGNOSIS — C672 Malignant neoplasm of lateral wall of bladder: Secondary | ICD-10-CM | POA: Diagnosis not present

## 2020-05-27 DIAGNOSIS — C673 Malignant neoplasm of anterior wall of bladder: Secondary | ICD-10-CM | POA: Diagnosis not present

## 2020-05-27 DIAGNOSIS — Z5111 Encounter for antineoplastic chemotherapy: Secondary | ICD-10-CM | POA: Diagnosis not present

## 2020-06-03 DIAGNOSIS — C673 Malignant neoplasm of anterior wall of bladder: Secondary | ICD-10-CM | POA: Diagnosis not present

## 2020-06-03 DIAGNOSIS — R8271 Bacteriuria: Secondary | ICD-10-CM | POA: Diagnosis not present

## 2020-06-06 ENCOUNTER — Encounter: Payer: Self-pay | Admitting: Cardiology

## 2020-06-06 ENCOUNTER — Ambulatory Visit (INDEPENDENT_AMBULATORY_CARE_PROVIDER_SITE_OTHER): Payer: Medicare Other | Admitting: Cardiology

## 2020-06-06 ENCOUNTER — Other Ambulatory Visit: Payer: Self-pay

## 2020-06-06 VITALS — BP 130/60 | HR 90 | Ht 70.0 in | Wt 261.0 lb

## 2020-06-06 DIAGNOSIS — I480 Paroxysmal atrial fibrillation: Secondary | ICD-10-CM | POA: Diagnosis not present

## 2020-06-06 DIAGNOSIS — C679 Malignant neoplasm of bladder, unspecified: Secondary | ICD-10-CM

## 2020-06-06 DIAGNOSIS — Z720 Tobacco use: Secondary | ICD-10-CM | POA: Diagnosis not present

## 2020-06-06 DIAGNOSIS — I5032 Chronic diastolic (congestive) heart failure: Secondary | ICD-10-CM

## 2020-06-06 DIAGNOSIS — I4892 Unspecified atrial flutter: Secondary | ICD-10-CM | POA: Diagnosis not present

## 2020-06-06 MED ORDER — RIVAROXABAN 20 MG PO TABS: 20.0000 mg | ORAL_TABLET | Freq: Every day | ORAL | 6 refills | Status: DC

## 2020-06-06 NOTE — Progress Notes (Signed)
Cardiology Office Note:    Date:  06/06/2020   ID:  Brian [name], DOB [birth year], MRN [id number]  PCP:  Orpah Melter, MD  [facility] Cardiologist:  Candee Furbish, MD  [facility] Electrophysiologist:  [name], MD   Referring MD: Orpah Melter, MD     History of Present Illness:    Brian [name] is a 69 y.o. male here for follow-up of atrial fibrillation post atrial flutter ablation.  Prior notes reviewed from Dr. Rayann Heman March 2020 had stopped his Xarelto because of concerns of bleeding and hematuria.  Dr. Rayann Heman offered him an implantable loop recorder to evaluate his A. fib burden but patient declined at that time.  Xarelto was then restarted.  He was last seen by Estella Husk, PA for preoperative transurethral resection of bladder tumor by Dr. Irine Seal of urology.  He was doing well at the time.  Xarelto was stopped by the patient 2 years ago because of expense.  He takes a low-dose aspirin.  Bladder cancer currently being treated.  BCG therapy.  Occasional hematuria with catheter placement.  See below.  Past Medical History:  Diagnosis Date  . Anemia    none since 2019  . Anxiety   . Atrial flutter ([place])    a. 08/2012  . CHF (congestive heart failure) (HCC)    chronic diastolic chf, pt denies  . GERD (gastroesophageal reflux disease)   . Gout    pt denies  . History of kidney stones    passed  . Hypertension   . Obesity   . Paroxysmal atrial fibrillation (HCC)    a fib cardioverion done 2018  . Pneumonia 09/2016  . Rheumatoid arthritis ([place])    "a little bit qwhere" (07/22/2017)  . Shortness of breath    with heavy  exertion  . Tobacco abuse     Past Surgical History:  Procedure Laterality Date  . A-FLUTTER ABLATION N/A 12/29/2016   Procedure: A-Flutter Ablation;  Surgeon: [name], MD;  Location: [place] [facility];  Service: Cardiovascular;  Laterality: N/A;  . CARDIOVERSION N/A 11/05/2016   Procedure: CARDIOVERSION;  Surgeon:  Sueanne Margarita, MD;  Location: [place];  Service: Cardiovascular;  Laterality: N/A;  . IR THORACENTESIS ASP PLEURAL SPACE W/IMG GUIDE  10/01/2016  . JOINT REPLACEMENT     Right hip and left knee  . MICROLARYNGOSCOPY  09/02/2007   with excision of right vocal cord mass, Dr. Constance Holster  . PROSTATE BIOPSY N/A 02/22/2020   Procedure: BIOPSY TRANSRECTAL ULTRASONIC PROSTATE (TUBP);  Surgeon: Irine Seal, MD;  [facility];  Service: Urology;  Laterality: N/A;  . TOTAL HIP ARTHROPLASTY Right 11/17/2017   Procedure: RIGHT TOTAL HIP ARTHROPLASTY ANTERIOR APPROACH;  Surgeon: Leandrew Koyanagi, MD;  Location: [place];  Service: Orthopedics;  Laterality: Right;  . TOTAL HIP ARTHROPLASTY Left 03/07/2018   Procedure: LEFT TOTAL HIP ARTHROPLASTY ANTERIOR APPROACH;  Surgeon: Leandrew Koyanagi, MD;  Location: [place];  Service: Orthopedics;  Laterality: Left;  . TOTAL KNEE ARTHROPLASTY Left 07/22/2017   Procedure: LEFT TOTAL KNEE ARTHROPLASTY;  Surgeon: Leandrew Koyanagi, MD;  Location: [place];  Service: Orthopedics;  Laterality: Left;  . TRANSURETHRAL RESECTION OF BLADDER TUMOR N/A 04/02/2020   Procedure: RESTAGING TRANSURETHRAL RESECTION OF BLADDER TUMOR (TURBT);  Surgeon: Irine Seal, MD;  Location: [facility]  Service: Urology;  Laterality: N/A;  GENERAL ANESTHESIA WIHT PARALYSIS  . TRANSURETHRAL RESECTION OF BLADDER TUMOR WITH MITOMYCIN-C N/A 02/22/2020  Procedure: TRANSURETHRAL RESECTION OF BLADDER TUMOR WITH   GEMCITABINE;  Surgeon: Irine Seal, MD;  Location[facility];  Service: Urology;  Laterality: N/A;    Current Medications: Current Meds  Medication Sig  . Adalimumab (HUMIRA) 40 MG/0.4ML PSKT Inject into the skin. no hold since may 2021  . cephALEXin (KEFLEX) 500 MG capsule Take 500 mg by mouth 3 (three) times daily. Pt is taking this for 14 days. Ends on 06/10/11  . doxycycline (VIBRA-TABS) 100 MG tablet Take 100 mg by mouth 2 (two) times daily.  Marland Kitchen esomeprazole  (NEXIUM) 20 MG capsule Take 20 mg by mouth daily at 12 noon.  . furosemide (LASIX) 40 MG tablet Take 2 tablets (80 mg total) by mouth daily.  Marland Kitchen HYDROcodone-acetaminophen (NORCO/VICODIN) 5-325 MG tablet Take 1 tablet by mouth every 6 (six) hours as needed for moderate pain.  . hydroxychloroquine (PLAQUENIL) 200 MG tablet Take 200 mg by mouth 2 (two) times daily.  . methotrexate (RHEUMATREX) 2.5 MG tablet Take 20 mg by mouth every Friday. In the morning.  . potassium chloride SA (KLOR-CON M20) 20 MEQ tablet Take 1 tablet (20 mEq total) by mouth daily.  Marland Kitchen senna-docusate (SENOKOT S) 8.6-50 MG tablet Take 1 tablet by mouth at bedtime as needed.  . tamsulosin (FLOMAX) 0.4 MG CAPS capsule Take 0.4 mg by mouth at bedtime.  . [DISCONTINUED] aspirin EC 81 MG tablet Take 81 mg by mouth daily. Swallow whole.     Allergies:   Tape   Social History   Socioeconomic History  . Marital status: Divorced    Spouse name: Not on file  . Number of children: 3  . Years of education: Not on file  . Highest education level: Not on file  Occupational History  . Not on file  Tobacco Use  . Smoking status: Current Every Day Smoker    Packs/day: 0.13    Years: 45.00    Pack years: 5.85    Types: Cigarettes  . Smokeless tobacco: Never Used  . Tobacco comment: "quit off and on"  Vaping Use  . Vaping Use: Never used  Substance and Sexual Activity  . Alcohol use: Yes    Comment: 07/22/2017 "nothing since 2016"  . Drug use: No    Comment: 07/22/2017 "none since 11/2016" cocaine (sister is not aware)  . Sexual activity: Not Currently  Other Topics Concern  . Not on file  Social History Narrative   Lives in [place]    Works at a convenience store   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   Divorced with 3 children and 9 grandchildren, he states ex-wife is still a friend   Investment banker, operational of Sales executive: Not on file  Food Insecurity: Not on file  Transportation Needs: Not on file   Physical Activity: Not on file  Stress: Not on file  Social Connections: Not on file     Family History: The patient's family history includes Cancer in his father and mother; Diabetes in his sister; Heart attack in his father; Heart disease in his father.  ROS:   Please see the history of present illness.    No fevers chills nausea vomiting syncope all other systems reviewed and are negative.  EKGs/Labs/Other Studies Reviewed:    The following studies were reviewed today:  ECHO 09/28/15 - Left ventricle: The cavity size was normal. Systolic function was normal. The estimated ejection fraction was in the range of 55% to 60%. Wall motion was normal;  there were no regional wall motion abnormalities. Left ventricular diastolic function parameters were normal. - Aortic valve: There was trivial regurgitation. Valve area (VTI): 1.79 cm^2. Valve area (Vmax): 2.01 cm^2. Valve area (Vmean): 2 cm^2. - Atrial septum: There was increased thickness of the septum, consistent with lipomatous hypertrophy   EKG: 02/21/2020-sinus rhythm 85  Recent Labs: 04/02/2020: BUN 15; Creatinine, Ser 0.90; Hemoglobin 15.6; Potassium 4.6; Sodium 142  Recent Lipid Panel    Component Value Date/Time   CHOL 148 02/21/2020 0902   TRIG 133 02/21/2020 0902   HDL 45 02/21/2020 0902   CHOLHDL 3.3 02/21/2020 0902   CHOLHDL 4.0 09/28/2015 0402   VLDL 25 09/28/2015 0402   LDLCALC 79 02/21/2020 0902     Physical Exam:    VS:  BP 130/60 (BP Location: Left Arm, Patient Position: Sitting, Cuff Size: Normal)   Pulse 90   Ht 5\' 10"  (1.778 m)   Wt 261 lb (118.4 kg)   SpO2 96%   BMI 37.45 kg/m     Wt Readings from Last 3 Encounters:  06/06/20 261 lb (118.4 kg)  04/02/20 259 lb 11.2 oz (117.8 kg)  02/22/20 262 lb 6.4 oz (119 kg)     GEN:  Well nourished, well developed in no acute distress HEENT: Normal NECK: No JVD; No carotid bruits LYMPHATICS: No lymphadenopathy CARDIAC: RRR, no  murmurs, rubs, gallops RESPIRATORY:  Clear to auscultation without rales, wheezing or rhonchi  ABDOMEN: Soft, non-tender, non-distended MUSCULOSKELETAL:  No edema; No deformity  SKIN: Warm and dry NEUROLOGIC:  Alert and oriented x 3 PSYCHIATRIC:  Normal affect   ASSESSMENT:    1. Paroxysmal atrial fibrillation (HCC)   2. Atrial flutter, unspecified type ([place])   3. Chronic diastolic heart failure ([place])   4. Tobacco abuse   5. Malignant neoplasm of urinary bladder, unspecified site [facility])    PLAN:    In order of problems listed above:  Paroxysmal atrial fibrillation -Patient stopped Xarelto over 2 years ago because of expense.  His CHA2DS2-VASc score was 3.  Agree with Amie Portland that he restart his Xarelto or Eliquis after surgery or cleared by urology. -We will go ahead and give him a prescription once again for Xarelto.  He is willing to see if he can afford it.  Tobacco use -Continue to encourage tobacco cessation.  He states that he started smoking again to calm his nerves after being diagnosed with cancer.  Atrial flutter -Post ablation therapy Dr. Rayann Heman.  Bladder cancer -Urology notes reviewed.  If he needs to hold his Xarelto for any treatments, this can be directed by urology and it is fine with me.  Chronic diastolic heart failure -Previously normal ejection fraction.  On Lasix.  Takes potassium supplementation.  Creatinine 0.9 hemoglobin 15.6, prior LDL 79  Medication Adjustments/Labs and Tests Ordered: Current medicines are reviewed at length with the patient today.  Concerns regarding medicines are outlined above.  No orders of the defined types were placed in this encounter.  Meds ordered this encounter  Medications  . rivaroxaban (XARELTO) 20 MG TABS tablet    Sig: Take 1 tablet (20 mg total) by mouth daily with supper.    Dispense:  30 tablet    Refill:  6    Patient Instructions  Medication Instructions:  Please discontinue your Aspirin once you  start Xarelto 20 mg a day. Continue all other medications as listed.  *If you need a refill on your cardiac medications before your next appointment, please call  your pharmacy*  Follow-Up: At [facility], you and your health needs are our priority.  As part of our continuing mission to provide you with exceptional heart care, we have created designated Provider Care Teams.  These Care Teams include your primary Cardiologist (physician) and Advanced Practice Providers (APPs -  Physician Assistants and Nurse Practitioners) who all work together to provide you with the care you need, when you need it.  We recommend signing up for the patient portal called "MyChart".  Sign up information is provided on this After Visit Summary.  MyChart is used to connect with patients for Virtual Visits (Telemedicine).  Patients are able to view lab/test results, encounter notes, upcoming appointments, etc.  Non-urgent messages can be sent to your provider as well.   To learn more about what you can do with MyChart, go to [email or web address].    Your next appointment:   6 month(s)  The format for your next appointment:   In Person  Provider:   Candee Furbish, MD   Thank you for choosing [facility]!!         Signed, Candee Furbish, MD  06/06/2020 9:32 AM    [place]

## 2020-06-06 NOTE — Patient Instructions (Signed)
Medication Instructions:  Please discontinue your Aspirin once you start Xarelto 20 mg a day. Continue all other medications as listed.  *If you need a refill on your cardiac medications before your next appointment, please call your pharmacy*  Follow-Up: At [facility], you and your health needs are our priority.  As part of our continuing mission to provide you with exceptional heart care, we have created designated Provider Care Teams.  These Care Teams include your primary Cardiologist (physician) and Advanced Practice Providers (APPs -  Physician Assistants and Nurse Practitioners) who all work together to provide you with the care you need, when you need it.  We recommend signing up for the patient portal called "MyChart".  Sign up information is provided on this After Visit Summary.  MyChart is used to connect with patients for Virtual Visits (Telemedicine).  Patients are able to view lab/test results, encounter notes, upcoming appointments, etc.  Non-urgent messages can be sent to your provider as well.   To learn more about what you can do with MyChart, go to [email or web address].    Your next appointment:   6 month(s)  The format for your next appointment:   In Person  Provider:   Candee Furbish, MD   Thank you for choosing [facility]!!

## 2020-06-10 DIAGNOSIS — Z5111 Encounter for antineoplastic chemotherapy: Secondary | ICD-10-CM | POA: Diagnosis not present

## 2020-06-10 DIAGNOSIS — C673 Malignant neoplasm of anterior wall of bladder: Secondary | ICD-10-CM | POA: Diagnosis not present

## 2020-06-17 DIAGNOSIS — Z5111 Encounter for antineoplastic chemotherapy: Secondary | ICD-10-CM | POA: Diagnosis not present

## 2020-06-17 DIAGNOSIS — C673 Malignant neoplasm of anterior wall of bladder: Secondary | ICD-10-CM | POA: Diagnosis not present

## 2020-06-24 DIAGNOSIS — C673 Malignant neoplasm of anterior wall of bladder: Secondary | ICD-10-CM | POA: Diagnosis not present

## 2020-06-24 DIAGNOSIS — C672 Malignant neoplasm of lateral wall of bladder: Secondary | ICD-10-CM | POA: Diagnosis not present

## 2020-06-24 DIAGNOSIS — Z5111 Encounter for antineoplastic chemotherapy: Secondary | ICD-10-CM | POA: Diagnosis not present

## 2020-07-02 DIAGNOSIS — C672 Malignant neoplasm of lateral wall of bladder: Secondary | ICD-10-CM | POA: Diagnosis not present

## 2020-07-02 DIAGNOSIS — Z5111 Encounter for antineoplastic chemotherapy: Secondary | ICD-10-CM | POA: Diagnosis not present

## 2020-07-02 DIAGNOSIS — C673 Malignant neoplasm of anterior wall of bladder: Secondary | ICD-10-CM | POA: Diagnosis not present

## 2020-07-10 DIAGNOSIS — E669 Obesity, unspecified: Secondary | ICD-10-CM | POA: Diagnosis not present

## 2020-07-10 DIAGNOSIS — M0579 Rheumatoid arthritis with rheumatoid factor of multiple sites without organ or systems involvement: Secondary | ICD-10-CM | POA: Diagnosis not present

## 2020-07-10 DIAGNOSIS — D508 Other iron deficiency anemias: Secondary | ICD-10-CM | POA: Diagnosis not present

## 2020-07-10 DIAGNOSIS — Z6837 Body mass index (BMI) 37.0-37.9, adult: Secondary | ICD-10-CM | POA: Diagnosis not present

## 2020-07-10 DIAGNOSIS — M150 Primary generalized (osteo)arthritis: Secondary | ICD-10-CM | POA: Diagnosis not present

## 2020-07-10 DIAGNOSIS — M2550 Pain in unspecified joint: Secondary | ICD-10-CM | POA: Diagnosis not present

## 2020-07-10 DIAGNOSIS — R5382 Chronic fatigue, unspecified: Secondary | ICD-10-CM | POA: Diagnosis not present

## 2020-09-30 DIAGNOSIS — Z8551 Personal history of malignant neoplasm of bladder: Secondary | ICD-10-CM | POA: Diagnosis not present

## 2020-09-30 DIAGNOSIS — N3000 Acute cystitis without hematuria: Secondary | ICD-10-CM | POA: Diagnosis not present

## 2020-10-09 DIAGNOSIS — N3020 Other chronic cystitis without hematuria: Secondary | ICD-10-CM | POA: Diagnosis not present

## 2020-10-09 DIAGNOSIS — Z8551 Personal history of malignant neoplasm of bladder: Secondary | ICD-10-CM | POA: Diagnosis not present

## 2020-10-10 DIAGNOSIS — D508 Other iron deficiency anemias: Secondary | ICD-10-CM | POA: Diagnosis not present

## 2020-10-10 DIAGNOSIS — R5382 Chronic fatigue, unspecified: Secondary | ICD-10-CM | POA: Diagnosis not present

## 2020-10-10 DIAGNOSIS — E669 Obesity, unspecified: Secondary | ICD-10-CM | POA: Diagnosis not present

## 2020-10-10 DIAGNOSIS — M2550 Pain in unspecified joint: Secondary | ICD-10-CM | POA: Diagnosis not present

## 2020-10-10 DIAGNOSIS — Z6837 Body mass index (BMI) 37.0-37.9, adult: Secondary | ICD-10-CM | POA: Diagnosis not present

## 2020-10-10 DIAGNOSIS — D090 Carcinoma in situ of bladder: Secondary | ICD-10-CM | POA: Diagnosis not present

## 2020-10-10 DIAGNOSIS — M150 Primary generalized (osteo)arthritis: Secondary | ICD-10-CM | POA: Diagnosis not present

## 2020-10-10 DIAGNOSIS — M0579 Rheumatoid arthritis with rheumatoid factor of multiple sites without organ or systems involvement: Secondary | ICD-10-CM | POA: Diagnosis not present

## 2020-10-16 DIAGNOSIS — Z0000 Encounter for general adult medical examination without abnormal findings: Secondary | ICD-10-CM | POA: Diagnosis not present

## 2020-10-16 DIAGNOSIS — I480 Paroxysmal atrial fibrillation: Secondary | ICD-10-CM | POA: Diagnosis not present

## 2020-10-16 DIAGNOSIS — M1990 Unspecified osteoarthritis, unspecified site: Secondary | ICD-10-CM | POA: Diagnosis not present

## 2020-10-16 DIAGNOSIS — F17200 Nicotine dependence, unspecified, uncomplicated: Secondary | ICD-10-CM | POA: Diagnosis not present

## 2020-10-16 DIAGNOSIS — I5032 Chronic diastolic (congestive) heart failure: Secondary | ICD-10-CM | POA: Diagnosis not present

## 2020-10-16 DIAGNOSIS — C679 Malignant neoplasm of bladder, unspecified: Secondary | ICD-10-CM | POA: Diagnosis not present

## 2020-10-16 DIAGNOSIS — D6869 Other thrombophilia: Secondary | ICD-10-CM | POA: Diagnosis not present

## 2020-10-16 DIAGNOSIS — M069 Rheumatoid arthritis, unspecified: Secondary | ICD-10-CM | POA: Diagnosis not present

## 2020-11-03 ENCOUNTER — Other Ambulatory Visit: Payer: Self-pay | Admitting: Physician Assistant

## 2020-12-02 ENCOUNTER — Other Ambulatory Visit: Payer: Self-pay

## 2020-12-02 ENCOUNTER — Encounter: Payer: Self-pay | Admitting: Cardiology

## 2020-12-02 ENCOUNTER — Ambulatory Visit (INDEPENDENT_AMBULATORY_CARE_PROVIDER_SITE_OTHER): Payer: Medicare Other | Admitting: Cardiology

## 2020-12-02 VITALS — BP 140/70 | HR 94 | Ht 70.0 in | Wt 254.0 lb

## 2020-12-02 DIAGNOSIS — Z720 Tobacco use: Secondary | ICD-10-CM

## 2020-12-02 DIAGNOSIS — I480 Paroxysmal atrial fibrillation: Secondary | ICD-10-CM | POA: Diagnosis not present

## 2020-12-02 DIAGNOSIS — I5032 Chronic diastolic (congestive) heart failure: Secondary | ICD-10-CM | POA: Diagnosis not present

## 2020-12-02 NOTE — Patient Instructions (Signed)
Medication Instructions:  The current medical regimen is effective;  continue present plan and medications.  *If you need a refill on your cardiac medications before your next appointment, please call your pharmacy*  Follow-Up: At [facility], you and your health needs are our priority.  As part of our continuing mission to provide you with exceptional heart care, we have created designated Provider Care Teams.  These Care Teams include your primary Cardiologist (physician) and Advanced Practice Providers (APPs -  Physician Assistants and Nurse Practitioners) who all work together to provide you with the care you need, when you need it.  We recommend signing up for the patient portal called "MyChart".  Sign up information is provided on this After Visit Summary.  MyChart is used to connect with patients for Virtual Visits (Telemedicine).  Patients are able to view lab/test results, encounter notes, upcoming appointments, etc.  Non-urgent messages can be sent to your provider as well.   To learn more about what you can do with MyChart, go to [email or web address].    Your next appointment:   6 month(s)  The format for your next appointment:   In Person  Provider:   You will see one of the following Advanced Practice Providers on your designated Care Team:    Kathyrn Drown, NP  Then, Candee Furbish, MD will plan to see you again in 12 month(s).   Thank you for choosing [facility]!!

## 2020-12-02 NOTE — Progress Notes (Signed)
Cardiology Office Note:    Date:  12/02/2020   ID:  Brian [name], DOB Oct 06, 1950, MRN [id number]  PCP:  Orpah Melter, MD   [facility] Providers Cardiologist:  Candee Furbish, MD Electrophysiologist:  [name], MD     Referring MD: Orpah Melter, MD    History of Present Illness:    Brian [name] is a 70 y.o. male here for follow-up of atrial fibrillation.  Back in 2020 Dr. Rayann Heman had stopped his Xarelto because of concerns of bleeding as well as hematuria.  He had offered him an implantable loop recorder to evaluate his burden of atrial fibrillation but he declined at that time.  After the bleeding seemed to subside, his Xarelto was restarted  He had a TURP performed by Dr. Irine Seal of urology  Xarelto was stopped by the patient about 2 to 3 years ago because of expense.  He is now back on the Xarelto but expense has been an issue.Marland Kitchen  He had bladder cancer treated with BCG therapy.  We see his sister Brian [name]  Past Medical History:  Diagnosis Date  . Anemia    none since 2019  . Anxiety   . Atrial flutter ([place])    a. 08/2012  . CHF (congestive heart failure) (HCC)    chronic diastolic chf, pt denies  . GERD (gastroesophageal reflux disease)   . Gout    pt denies  . History of kidney stones    passed  . Hypertension   . Obesity   . Paroxysmal atrial fibrillation (HCC)    a fib cardioverion done 2018  . Pneumonia 09/2016  . Rheumatoid arthritis ([place])    "a little bit qwhere" (07/22/2017)  . Shortness of breath    with heavy  exertion  . Tobacco abuse     Past Surgical History:  Procedure Laterality Date  . A-FLUTTER ABLATION N/A 12/29/2016   Procedure: A-Flutter Ablation;  Surgeon: [name], MD;  Location: [place] [facility];  Service: Cardiovascular;  Laterality: N/A;  . CARDIOVERSION N/A 11/05/2016   Procedure: CARDIOVERSION;  Surgeon: Sueanne Margarita, MD;  Location: [place];  Service: Cardiovascular;  Laterality: N/A;  . IR  THORACENTESIS ASP PLEURAL SPACE W/IMG GUIDE  10/01/2016  . JOINT REPLACEMENT     Right hip and left knee  . MICROLARYNGOSCOPY  09/02/2007   with excision of right vocal cord mass, Dr. Constance Holster  . PROSTATE BIOPSY N/A 02/22/2020   Procedure: BIOPSY TRANSRECTAL ULTRASONIC PROSTATE (TUBP);  Surgeon: Irine Seal, MD;  Location: [facility];  Service: Urology;  Laterality: N/A;  . TOTAL HIP ARTHROPLASTY Right 11/17/2017   Procedure: RIGHT TOTAL HIP ARTHROPLASTY ANTERIOR APPROACH;  Surgeon: Leandrew Koyanagi, MD;  Location: [place];  Service: Orthopedics;  Laterality: Right;  . TOTAL HIP ARTHROPLASTY Left 03/07/2018   Procedure: LEFT TOTAL HIP ARTHROPLASTY ANTERIOR APPROACH;  Surgeon: Leandrew Koyanagi, MD;  Location: [place];  Service: Orthopedics;  Laterality: Left;  . TOTAL KNEE ARTHROPLASTY Left 07/22/2017   Procedure: LEFT TOTAL KNEE ARTHROPLASTY;  Surgeon: Leandrew Koyanagi, MD;  Location: [place];  Service: Orthopedics;  Laterality: Left;  . TRANSURETHRAL RESECTION OF BLADDER TUMOR N/A 04/02/2020   Procedure: RESTAGING TRANSURETHRAL RESECTION OF BLADDER TUMOR (TURBT);  Surgeon: Irine Seal, MD;  Location: [facility];  Service: Urology;  Laterality: N/A;  GENERAL ANESTHESIA WIHT PARALYSIS  . TRANSURETHRAL RESECTION OF BLADDER TUMOR WITH MITOMYCIN-C N/A 02/22/2020   Procedure: TRANSURETHRAL RESECTION OF BLADDER TUMOR WITH  GEMCITABINE;  Surgeon: Irine Seal, MD;  Location: [facility];  Service: Urology;  Laterality: N/A;    Current Medications: Current Meds  Medication Sig  . cephALEXin (KEFLEX) 500 MG capsule Take 500 mg by mouth daily.  Marland Kitchen doxycycline (VIBRA-TABS) 100 MG tablet Take 100 mg by mouth 2 (two) times daily.  Marland Kitchen esomeprazole (NEXIUM) 20 MG capsule Take 20 mg by mouth daily at 12 noon.  . furosemide (LASIX) 40 MG tablet TAKE 2 TABLETS BY MOUTH EVERY DAY  . HYDROcodone-acetaminophen (NORCO/VICODIN) 5-325 MG tablet Take 1 tablet by mouth every 6 (six) hours as needed  for moderate pain.  . hydroxychloroquine (PLAQUENIL) 200 MG tablet Take 200 mg by mouth 2 (two) times daily.  . methotrexate (RHEUMATREX) 2.5 MG tablet Take 20 mg by mouth every Friday. In the morning.  . potassium chloride SA (KLOR-CON M20) 20 MEQ tablet Take 1 tablet (20 mEq total) by mouth daily.  . rivaroxaban (XARELTO) 20 MG TABS tablet Take 1 tablet (20 mg total) by mouth daily with supper.  . senna-docusate (SENOKOT S) 8.6-50 MG tablet Take 1 tablet by mouth at bedtime as needed.  . tamsulosin (FLOMAX) 0.4 MG CAPS capsule Take 0.4 mg by mouth at bedtime.     Allergies:   Tape   Social History   Socioeconomic History  . Marital status: Divorced    Spouse name: Not on file  . Number of children: 3  . Years of education: Not on file  . Highest education level: Not on file  Occupational History  . Not on file  Tobacco Use  . Smoking status: Current Every Day Smoker    Packs/day: 0.13    Years: 45.00    Pack years: 5.85    Types: Cigarettes  . Smokeless tobacco: Never Used  . Tobacco comment: "quit off and on"  Vaping Use  . Vaping Use: Never used  Substance and Sexual Activity  . Alcohol use: Yes    Comment: 07/22/2017 "nothing since 2016"  . Drug use: No    Comment: 07/22/2017 "none since 11/2016" cocaine (sister is not aware)  . Sexual activity: Not Currently  Other Topics Concern  . Not on file  Social History Narrative   Lives in [place]    Works at a convenience store   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   Divorced with 3 children and 9 grandchildren, he states ex-wife is still a friend   Investment banker, operational of Sales executive: Not on file  Food Insecurity: Not on file  Transportation Needs: Not on file  Physical Activity: Not on file  Stress: Not on file  Social Connections: Not on file     Family History: The patient's family history includes Cancer in his father and mother; Diabetes in his sister; Heart attack in his father; Heart disease  in his father.  ROS:   Please see the history of present illness.     All other systems reviewed and are negative.  EKGs/Labs/Other Studies Reviewed:    The following studies were reviewed today: Echo 2017-trivial aortic regurgitation, EF 60% lipomatous hypertrophy of septum.   Recent Labs: 04/02/2020: BUN 15; Creatinine, Ser 0.90; Hemoglobin 15.6; Potassium 4.6; Sodium 142  Recent Lipid Panel    Component Value Date/Time   CHOL 148 02/21/2020 0902   TRIG 133 02/21/2020 0902   HDL 45 02/21/2020 0902   CHOLHDL 3.3 02/21/2020 0902   CHOLHDL 4.0 09/28/2015 0402   VLDL 25 09/28/2015 0402  [place] 79 02/21/2020 0902     Risk Assessment/Calculations:      Physical Exam:    VS:  BP 140/70 (BP Location: Left Arm, Patient Position: Sitting, Cuff Size: Normal)   Pulse 94   Ht 5\' 10"  (1.778 m)   Wt 254 lb (115.2 kg)   SpO2 95%   BMI 36.45 kg/m     Wt Readings from Last 3 Encounters:  12/02/20 254 lb (115.2 kg)  06/06/20 261 lb (118.4 kg)  04/02/20 259 lb 11.2 oz (117.8 kg)     GEN:  Well nourished, well developed in no acute distress, ambulates with a cane HEENT: Normal NECK: No JVD; No carotid bruits LYMPHATICS: No lymphadenopathy CARDIAC: RRR, no murmurs, rubs, gallops RESPIRATORY:  Clear to auscultation without rales, wheezing or rhonchi  ABDOMEN: Soft, non-tender, non-distended MUSCULOSKELETAL:  No edema; No deformity  SKIN: Warm and dry NEUROLOGIC:  Alert and oriented x 3 PSYCHIATRIC:  Normal affect   ASSESSMENT:    1. Paroxysmal atrial fibrillation (HCC)   2. Chronic diastolic heart failure ([place])   3. Tobacco abuse    PLAN:    In order of problems listed above:  Paroxysmal atrial fibrillation - Previously he had stopped his Xarelto because of expense.  We had given him a new prescription at prior visit.  He states he is taking it but it is approximately $90 a month.  His wife's insulin is more expensive.  Frustrating for him.  He understands the  alternative of Coumadin but does not wish to go to the lab.  Continue with current medical management.  Tobacco use - Continue to encourage tobacco cessation.  A pack lasts him about 3 days.  Atrial flutter - Had an ablation performed by Dr. Rayann Heman.  Maintaining normal rhythm.  Bladder cancer - Prior urology notes have been reviewed.  If he does have any urologic surgeries, hold Xarelto as directed by urology.  He may need to have yearly maintenance treatments  Chronic diastolic heart failure - Normal EF.  On Lasix.  NYHA class I.  Encourage daily exercise.       Medication Adjustments/Labs and Tests Ordered: Current medicines are reviewed at length with the patient today.  Concerns regarding medicines are outlined above.  No orders of the defined types were placed in this encounter.  No orders of the defined types were placed in this encounter.   Patient Instructions  Medication Instructions:  The current medical regimen is effective;  continue present plan and medications.  *If you need a refill on your cardiac medications before your next appointment, please call your pharmacy*  Follow-Up: At [facility], you and your health needs are our priority.  As part of our continuing mission to provide you with exceptional heart care, we have created designated Provider Care Teams.  These Care Teams include your primary Cardiologist (physician) and Advanced Practice Providers (APPs -  Physician Assistants and Nurse Practitioners) who all work together to provide you with the care you need, when you need it.  We recommend signing up for the patient portal called "MyChart".  Sign up information is provided on this After Visit Summary.  MyChart is used to connect with patients for Virtual Visits (Telemedicine).  Patients are able to view lab/test results, encounter notes, upcoming appointments, etc.  Non-urgent messages can be sent to your provider as well.   To learn more about what you  can do with MyChart, go to [email or web address].    Your next appointment:  6 month(s)  The format for your next appointment:   In Person  Provider:   You will see one of the following Advanced Practice Providers on your designated Care Team:    Kathyrn Drown, NP  Then, Candee Furbish, MD will plan to see you again in 12 month(s).   Thank you for choosing [facility]!!         Signed, Candee Furbish, MD  12/02/2020 8:23 AM    [facility]

## 2020-12-13 DIAGNOSIS — M0579 Rheumatoid arthritis with rheumatoid factor of multiple sites without organ or systems involvement: Secondary | ICD-10-CM | POA: Diagnosis not present

## 2021-01-02 ENCOUNTER — Other Ambulatory Visit: Payer: Self-pay | Admitting: Cardiology

## 2021-01-02 NOTE — Telephone Encounter (Signed)
Prescription refill request for Xarelto received.  Indication: PAF Last office visit: 12/02/20  Marlou Porch MD Weight: 115.2kg Age: 70 Scr: 0.90 CrCl: 126.22  Based on above findings Xarelto 20mg  daily is the appropriate dose.  Refill approved.

## 2021-01-08 DIAGNOSIS — Z8551 Personal history of malignant neoplasm of bladder: Secondary | ICD-10-CM | POA: Diagnosis not present

## 2021-01-08 DIAGNOSIS — N3020 Other chronic cystitis without hematuria: Secondary | ICD-10-CM | POA: Diagnosis not present

## 2021-02-03 ENCOUNTER — Other Ambulatory Visit: Payer: Self-pay | Admitting: Physician Assistant

## 2021-02-04 DIAGNOSIS — C673 Malignant neoplasm of anterior wall of bladder: Secondary | ICD-10-CM | POA: Diagnosis not present

## 2021-02-04 DIAGNOSIS — N3020 Other chronic cystitis without hematuria: Secondary | ICD-10-CM | POA: Diagnosis not present

## 2021-02-19 DIAGNOSIS — C673 Malignant neoplasm of anterior wall of bladder: Secondary | ICD-10-CM | POA: Diagnosis not present

## 2021-02-19 DIAGNOSIS — Z5111 Encounter for antineoplastic chemotherapy: Secondary | ICD-10-CM | POA: Diagnosis not present

## 2021-02-26 DIAGNOSIS — C673 Malignant neoplasm of anterior wall of bladder: Secondary | ICD-10-CM | POA: Diagnosis not present

## 2021-02-26 DIAGNOSIS — Z5111 Encounter for antineoplastic chemotherapy: Secondary | ICD-10-CM | POA: Diagnosis not present

## 2021-03-05 DIAGNOSIS — Z5111 Encounter for antineoplastic chemotherapy: Secondary | ICD-10-CM | POA: Diagnosis not present

## 2021-03-05 DIAGNOSIS — C673 Malignant neoplasm of anterior wall of bladder: Secondary | ICD-10-CM | POA: Diagnosis not present

## 2021-04-07 DIAGNOSIS — Z8551 Personal history of malignant neoplasm of bladder: Secondary | ICD-10-CM | POA: Diagnosis not present

## 2021-04-07 DIAGNOSIS — N3020 Other chronic cystitis without hematuria: Secondary | ICD-10-CM | POA: Diagnosis not present

## 2021-04-11 DIAGNOSIS — R5382 Chronic fatigue, unspecified: Secondary | ICD-10-CM | POA: Diagnosis not present

## 2021-04-11 DIAGNOSIS — M150 Primary generalized (osteo)arthritis: Secondary | ICD-10-CM | POA: Diagnosis not present

## 2021-04-11 DIAGNOSIS — Z6836 Body mass index (BMI) 36.0-36.9, adult: Secondary | ICD-10-CM | POA: Diagnosis not present

## 2021-04-11 DIAGNOSIS — E669 Obesity, unspecified: Secondary | ICD-10-CM | POA: Diagnosis not present

## 2021-04-11 DIAGNOSIS — D090 Carcinoma in situ of bladder: Secondary | ICD-10-CM | POA: Diagnosis not present

## 2021-04-11 DIAGNOSIS — M0579 Rheumatoid arthritis with rheumatoid factor of multiple sites without organ or systems involvement: Secondary | ICD-10-CM | POA: Diagnosis not present

## 2021-04-11 DIAGNOSIS — D508 Other iron deficiency anemias: Secondary | ICD-10-CM | POA: Diagnosis not present

## 2021-04-11 DIAGNOSIS — M2550 Pain in unspecified joint: Secondary | ICD-10-CM | POA: Diagnosis not present

## 2021-04-15 DIAGNOSIS — N3020 Other chronic cystitis without hematuria: Secondary | ICD-10-CM | POA: Diagnosis not present

## 2021-04-15 DIAGNOSIS — Z8551 Personal history of malignant neoplasm of bladder: Secondary | ICD-10-CM | POA: Diagnosis not present

## 2021-04-15 DIAGNOSIS — R972 Elevated prostate specific antigen [PSA]: Secondary | ICD-10-CM | POA: Diagnosis not present

## 2021-05-21 ENCOUNTER — Other Ambulatory Visit: Payer: Self-pay | Admitting: Internal Medicine

## 2021-07-09 DIAGNOSIS — R972 Elevated prostate specific antigen [PSA]: Secondary | ICD-10-CM | POA: Diagnosis not present

## 2021-07-09 DIAGNOSIS — N3020 Other chronic cystitis without hematuria: Secondary | ICD-10-CM | POA: Diagnosis not present

## 2021-07-10 ENCOUNTER — Encounter (HOSPITAL_COMMUNITY): Payer: Self-pay | Admitting: Emergency Medicine

## 2021-07-10 ENCOUNTER — Emergency Department (HOSPITAL_COMMUNITY): Payer: Medicare Other

## 2021-07-10 ENCOUNTER — Other Ambulatory Visit: Payer: Self-pay

## 2021-07-10 ENCOUNTER — Emergency Department (HOSPITAL_COMMUNITY)
Admission: EM | Admit: 2021-07-10 | Discharge: 2021-07-10 | Disposition: A | Discharge status: Home / Self Care | Payer: Medicare Other | Attending: Student | Admitting: Student

## 2021-07-10 DIAGNOSIS — M109 Gout, unspecified: Secondary | ICD-10-CM | POA: Diagnosis not present

## 2021-07-10 DIAGNOSIS — M25531 Pain in right wrist: Secondary | ICD-10-CM | POA: Diagnosis not present

## 2021-07-10 DIAGNOSIS — Z5321 Procedure and treatment not carried out due to patient leaving prior to being seen by health care provider: Secondary | ICD-10-CM | POA: Diagnosis not present

## 2021-07-10 DIAGNOSIS — M1380 Other specified arthritis, unspecified site: Secondary | ICD-10-CM | POA: Diagnosis not present

## 2021-07-10 DIAGNOSIS — M7989 Other specified soft tissue disorders: Secondary | ICD-10-CM | POA: Diagnosis not present

## 2021-07-10 MED ORDER — PREDNISONE 20 MG PO TABS: 60.0000 mg | ORAL_TABLET | Freq: Once | ORAL | Status: DC

## 2021-07-10 MED ORDER — OXYCODONE-ACETAMINOPHEN 5-325 MG PO TABS: 1.0000 | ORAL_TABLET | Freq: Once | ORAL | Status: DC

## 2021-07-10 NOTE — ED Provider Triage Note (Signed)
Emergency Medicine Provider Triage Evaluation Note  Brian [name] , a 71 y.o. male  was evaluated in triage.  Pt complains of right wrist pain.  Symptoms started tonight.  Reports severe pain and swelling in his right wrist.  He has a history of rheumatoid arthritis and this is when he typically gets his most severe flares.  Completed a course of prednisone 6 days ago for a flare in this wrist and night symptoms started to worsen again.  Reports that become swollen and warm to the touch but no overlying redness.  Denies any fevers or chills.  Denies any other arthralgias.  Review of Systems  Positive: Wrist pain Negative: Fevers, skin changes, numbness, weakness  Physical Exam  BP (!) 137/106 (BP Location: Left Arm)    Pulse (!) 45    Temp 98.3 F (36.8 C) (Oral)    Resp (!) 22    SpO2 100%  Gen:   Awake, no distress   Resp:  Normal effort  MSK:   Moves extremities without difficulty, tenderness, swelling and warmth over the right wrist.  No other arthralgias Other:    Medical Decision Making  Medically screening exam initiated at 2:33 AM.  Appropriate orders placed.  Brian [name] was informed that the remainder of the evaluation will be completed by another provider, this initial triage assessment does not replace that evaluation, and the importance of remaining in the ED until their evaluation is complete.     Brian [name], Brian [name] 07/10/21 [phone]

## 2021-07-10 NOTE — ED Notes (Signed)
Patient called x3 with no response

## 2021-07-10 NOTE — ED Triage Notes (Addendum)
Pt reports right wrist pain, stating that it just started swelling tonight.  States that "pain is so bad it hurts to breath."  6 days ago pt finished a prescription of steriods for his RA.  Pt states this is a "bad flair up."

## 2021-07-15 DIAGNOSIS — M0579 Rheumatoid arthritis with rheumatoid factor of multiple sites without organ or systems involvement: Secondary | ICD-10-CM | POA: Diagnosis not present

## 2021-07-16 DIAGNOSIS — N401 Enlarged prostate with lower urinary tract symptoms: Secondary | ICD-10-CM | POA: Diagnosis not present

## 2021-07-16 DIAGNOSIS — R351 Nocturia: Secondary | ICD-10-CM | POA: Diagnosis not present

## 2021-07-16 DIAGNOSIS — Z8551 Personal history of malignant neoplasm of bladder: Secondary | ICD-10-CM | POA: Diagnosis not present

## 2021-07-16 DIAGNOSIS — N3020 Other chronic cystitis without hematuria: Secondary | ICD-10-CM | POA: Diagnosis not present

## 2021-07-16 DIAGNOSIS — R972 Elevated prostate specific antigen [PSA]: Secondary | ICD-10-CM | POA: Diagnosis not present

## 2021-07-17 ENCOUNTER — Other Ambulatory Visit: Payer: Self-pay | Admitting: Internal Medicine

## 2021-07-29 DIAGNOSIS — I5032 Chronic diastolic (congestive) heart failure: Secondary | ICD-10-CM | POA: Diagnosis not present

## 2021-07-29 DIAGNOSIS — M1990 Unspecified osteoarthritis, unspecified site: Secondary | ICD-10-CM | POA: Diagnosis not present

## 2021-07-29 DIAGNOSIS — M069 Rheumatoid arthritis, unspecified: Secondary | ICD-10-CM | POA: Diagnosis not present

## 2021-07-29 DIAGNOSIS — I480 Paroxysmal atrial fibrillation: Secondary | ICD-10-CM | POA: Diagnosis not present

## 2021-07-29 DIAGNOSIS — D509 Iron deficiency anemia, unspecified: Secondary | ICD-10-CM | POA: Diagnosis not present

## 2021-07-30 ENCOUNTER — Other Ambulatory Visit: Payer: Self-pay | Admitting: Internal Medicine

## 2021-08-08 DIAGNOSIS — D508 Other iron deficiency anemias: Secondary | ICD-10-CM | POA: Diagnosis not present

## 2021-08-15 DIAGNOSIS — D508 Other iron deficiency anemias: Secondary | ICD-10-CM | POA: Diagnosis not present

## 2021-08-19 DIAGNOSIS — C673 Malignant neoplasm of anterior wall of bladder: Secondary | ICD-10-CM | POA: Diagnosis not present

## 2021-08-19 DIAGNOSIS — Z5111 Encounter for antineoplastic chemotherapy: Secondary | ICD-10-CM | POA: Diagnosis not present

## 2021-08-26 DIAGNOSIS — C673 Malignant neoplasm of anterior wall of bladder: Secondary | ICD-10-CM | POA: Diagnosis not present

## 2021-08-26 DIAGNOSIS — Z5111 Encounter for antineoplastic chemotherapy: Secondary | ICD-10-CM | POA: Diagnosis not present

## 2021-09-02 DIAGNOSIS — C673 Malignant neoplasm of anterior wall of bladder: Secondary | ICD-10-CM | POA: Diagnosis not present

## 2021-09-02 DIAGNOSIS — Z5111 Encounter for antineoplastic chemotherapy: Secondary | ICD-10-CM | POA: Diagnosis not present

## 2021-09-12 DIAGNOSIS — I5032 Chronic diastolic (congestive) heart failure: Secondary | ICD-10-CM | POA: Diagnosis not present

## 2021-09-12 DIAGNOSIS — I480 Paroxysmal atrial fibrillation: Secondary | ICD-10-CM | POA: Diagnosis not present

## 2021-09-12 DIAGNOSIS — M069 Rheumatoid arthritis, unspecified: Secondary | ICD-10-CM | POA: Diagnosis not present

## 2021-09-29 DIAGNOSIS — N35811 Other urethral stricture, male, meatal: Secondary | ICD-10-CM | POA: Diagnosis not present

## 2021-09-29 DIAGNOSIS — Z8551 Personal history of malignant neoplasm of bladder: Secondary | ICD-10-CM | POA: Diagnosis not present

## 2021-09-29 DIAGNOSIS — R351 Nocturia: Secondary | ICD-10-CM | POA: Diagnosis not present

## 2021-09-29 DIAGNOSIS — N401 Enlarged prostate with lower urinary tract symptoms: Secondary | ICD-10-CM | POA: Diagnosis not present

## 2021-09-29 DIAGNOSIS — N3020 Other chronic cystitis without hematuria: Secondary | ICD-10-CM | POA: Diagnosis not present

## 2021-10-13 DIAGNOSIS — D090 Carcinoma in situ of bladder: Secondary | ICD-10-CM | POA: Diagnosis not present

## 2021-10-13 DIAGNOSIS — M0579 Rheumatoid arthritis with rheumatoid factor of multiple sites without organ or systems involvement: Secondary | ICD-10-CM | POA: Diagnosis not present

## 2021-10-13 DIAGNOSIS — M2550 Pain in unspecified joint: Secondary | ICD-10-CM | POA: Diagnosis not present

## 2021-10-13 DIAGNOSIS — Z6834 Body mass index (BMI) 34.0-34.9, adult: Secondary | ICD-10-CM | POA: Diagnosis not present

## 2021-10-13 DIAGNOSIS — E669 Obesity, unspecified: Secondary | ICD-10-CM | POA: Diagnosis not present

## 2021-10-13 DIAGNOSIS — M1991 Primary osteoarthritis, unspecified site: Secondary | ICD-10-CM | POA: Diagnosis not present

## 2021-10-13 DIAGNOSIS — D508 Other iron deficiency anemias: Secondary | ICD-10-CM | POA: Diagnosis not present

## 2021-10-13 DIAGNOSIS — R5382 Chronic fatigue, unspecified: Secondary | ICD-10-CM | POA: Diagnosis not present

## 2021-10-29 ENCOUNTER — Other Ambulatory Visit: Payer: Self-pay | Admitting: Internal Medicine

## 2021-11-05 ENCOUNTER — Other Ambulatory Visit: Payer: Self-pay | Admitting: Internal Medicine

## 2021-12-03 ENCOUNTER — Other Ambulatory Visit: Payer: Self-pay | Admitting: Internal Medicine

## 2021-12-03 ENCOUNTER — Telehealth: Payer: Self-pay | Admitting: Cardiology

## 2021-12-03 DIAGNOSIS — M1990 Unspecified osteoarthritis, unspecified site: Secondary | ICD-10-CM | POA: Diagnosis not present

## 2021-12-03 DIAGNOSIS — I480 Paroxysmal atrial fibrillation: Secondary | ICD-10-CM | POA: Diagnosis not present

## 2021-12-03 DIAGNOSIS — I5032 Chronic diastolic (congestive) heart failure: Secondary | ICD-10-CM | POA: Diagnosis not present

## 2021-12-03 NOTE — Telephone Encounter (Signed)
Hey Lynn, LPN, can you please advise on this matter? Thanks  ?

## 2021-12-03 NOTE — Telephone Encounter (Signed)
Patient calling the office for samples of medication:   1.  What medication and dosage are you requesting samples for? Xarelto 20 mg  2.  Are you currently out of this medication? Yes, patient stopped taking it, because it was so expensive and could not get assistance

## 2021-12-03 NOTE — Telephone Encounter (Signed)
**Note De-Identified  Obfuscation** The pt states that he has not applied for assistance for his Eliquis through [place] in 5 years as he states that he was unaware that he needed to apply every year.  He is advised that we are leaving him a [employer] pt asst application and 2 bottles of Xarelto 20 mg samples in the front office at Dr Kingsley Plan office at [facility] at [address] in [place] for him to pick up.  I advised him to complete his part of the application, obtain required documents per J[employer] pt asst foundation (instructions on 1st page of the application), and to bring all back to the office to drop off and that we will take care of the providers page of his application and will fax all to [employer] Pt assistance [employer].  He had questions about the [employer] Pt Asst program and his eligibility to be approved for the Xarelto program so I gave him J[employer] pt asst's phone number.  He states that if he is not eligible for this program he will call me back to discuss other options.

## 2021-12-03 NOTE — Telephone Encounter (Signed)
Leaving pt 2 bottles of Xarelto 20 mg tablets and pt assistance forms at the front desk at [facility]. Thanks Jeani Hawking, LPN

## 2021-12-17 DIAGNOSIS — D6869 Other thrombophilia: Secondary | ICD-10-CM | POA: Diagnosis not present

## 2021-12-17 DIAGNOSIS — I480 Paroxysmal atrial fibrillation: Secondary | ICD-10-CM | POA: Diagnosis not present

## 2021-12-17 DIAGNOSIS — F17200 Nicotine dependence, unspecified, uncomplicated: Secondary | ICD-10-CM | POA: Diagnosis not present

## 2021-12-17 DIAGNOSIS — L989 Disorder of the skin and subcutaneous tissue, unspecified: Secondary | ICD-10-CM | POA: Diagnosis not present

## 2021-12-17 DIAGNOSIS — I5032 Chronic diastolic (congestive) heart failure: Secondary | ICD-10-CM | POA: Diagnosis not present

## 2021-12-17 DIAGNOSIS — Z0000 Encounter for general adult medical examination without abnormal findings: Secondary | ICD-10-CM | POA: Diagnosis not present

## 2021-12-26 ENCOUNTER — Other Ambulatory Visit: Payer: Self-pay | Admitting: Cardiology

## 2022-01-08 ENCOUNTER — Encounter: Payer: Self-pay | Admitting: Cardiology

## 2022-01-08 ENCOUNTER — Ambulatory Visit (INDEPENDENT_AMBULATORY_CARE_PROVIDER_SITE_OTHER): Payer: Medicare Other | Admitting: Cardiology

## 2022-01-08 DIAGNOSIS — I5032 Chronic diastolic (congestive) heart failure: Secondary | ICD-10-CM | POA: Diagnosis not present

## 2022-01-08 DIAGNOSIS — I4819 Other persistent atrial fibrillation: Secondary | ICD-10-CM

## 2022-01-08 DIAGNOSIS — C679 Malignant neoplasm of bladder, unspecified: Secondary | ICD-10-CM | POA: Insufficient documentation

## 2022-01-08 DIAGNOSIS — Z720 Tobacco use: Secondary | ICD-10-CM

## 2022-01-08 MED ORDER — RIVAROXABAN 20 MG PO TABS: 20.0000 mg | ORAL_TABLET | Freq: Every day | ORAL | 6 refills | Status: DC

## 2022-01-08 MED ORDER — DILTIAZEM HCL ER COATED BEADS 120 MG PO CP24: 120.0000 mg | ORAL_CAPSULE | Freq: Every day | ORAL | 3 refills | Status: DC

## 2022-01-08 NOTE — Patient Instructions (Signed)
Medication Instructions:  Please start Diltiazem CD 120 mg once a day. Continue all other medications as listed.  *If you need a refill on your cardiac medications before your next appointment, please call your pharmacy*  Follow-Up: At [facility], you and your health needs are our priority.  As part of our continuing mission to provide you with exceptional heart care, we have created designated Provider Care Teams.  These Care Teams include your primary Cardiologist (physician) and Advanced Practice Providers (APPs -  Physician Assistants and Nurse Practitioners) who all work together to provide you with the care you need, when you need it.  We recommend signing up for the patient portal called "MyChart".  Sign up information is provided on this After Visit Summary.  MyChart is used to connect with patients for Virtual Visits (Telemedicine).  Patients are able to view lab/test results, encounter notes, upcoming appointments, etc.  Non-urgent messages can be sent to your provider as well.   To learn more about what you can do with MyChart, go to [email or web address].    Your next appointment:   3 month(s)  The format for your next appointment:   In Person  Provider:   Robbie Lis, PA-C, Nicholes Rough, PA-C, Melina Copa, PA-C, Ambrose Pancoast, NP, Ermalinda Barrios, PA-C, Christen Bame, NP, or Richardson Dopp, PA-C     {   Important Information About Sugar

## 2022-01-08 NOTE — Assessment & Plan Note (Addendum)
Atrial fibrillation ablation 2018. 2020 Xarelto was stopped because of bleeding hematuria.  He was offered implantable loop but declined. After bleeding subsided, Xarelto was restarted.  However, expense became an issue.  Bladder cancer was discovered and treated with BCG therapy by Dr. Jeffie Pollock.  He is now back in atrial fibrillation as of 01/08/2022 on EKG with heart rate of 94 bpm at rest.  We will go ahead and give him Cardizem CD 120 mg once a day.  We cannot perform a cardioversion at this time because he is undergoing cystoscopy at the end of this month.  He has to hold his Xarelto for this.  He has felt some decrease in energy but overall is not severely symptomatic.  After he is done with his urologic treatments, we can consider cardioversion at that time.  He has also stated that the Xarelto once again is expensive for him.  He wants sent in for him however.  He sent in paperwork to [facility] and he is waiting to hear back.  Hemoglobin 13.0 creatinine 0.7

## 2022-01-08 NOTE — Assessment & Plan Note (Signed)
Normal ejection fraction on prior echocardiogram.  Continues with Lasix 40 mg a day.  NYHA class I.  Continue to encourage daily exercise.

## 2022-01-08 NOTE — Assessment & Plan Note (Signed)
Urology notes reviewed.  If he needs yearly treatments, may need to hold Xarelto transiently for this.

## 2022-01-08 NOTE — Progress Notes (Signed)
Cardiology Office Note:    Date:  01/08/2022   ID:  Brian [name], DOB [birth year], MRN [id number]  PCP:  Orpah Melter, MD   [facility] Providers Cardiologist:  Candee Furbish, MD Electrophysiologist:  [name], MD     Referring MD: Orpah Melter, MD    History of Present Illness:    Brian [name] is a 71 y.o. male here for the follow-up of atrial fibrillation, hypertension, and congestive heart failure.  Back in 2020 Dr. Rayann Heman had stopped his Xarelto because of concerns of bleeding as well as hematuria.  He had offered him an implantable loop recorder to evaluate his burden of atrial fibrillation but he declined at that time.  After the bleeding seemed to subside, his Xarelto was restarted.  Prior ablation by Dr. Rayann Heman in 12/2016.  He had bladder cancer treated with BCG therapy. He had a TURP performed by Dr. Irine Seal of urology.  We see his sister Brian [name].  Xarelto was previously stopped by the patient because of expense. At his last appointment he was back on the Xarelto but expense was still an issue.    Today: Overall he appears well. Other than feeling more fatigued than he would like, he does not seem to be aware of being in atrial fibrillation today.  He endorses easy bruising on Xarelto.  He states that soon he is scheduled for cystoscopy to determine if his bladder cancer is still present. If there is evidence of cancer he will have 3-6 more weeks of BCG therapy.  He denies any palpitations, chest pain, shortness of breath, or peripheral edema. No lightheadedness, headaches, syncope, orthopnea, or PND.   Past Medical History:  Diagnosis Date   Anemia    none since 2019   Anxiety    Atrial flutter ([place])    a. 08/2012   CHF (congestive heart failure) (HCC)    chronic diastolic chf, pt denies   GERD (gastroesophageal reflux disease)    Gout    pt denies   History of kidney stones    passed   Hypertension    Obesity    Paroxysmal atrial  fibrillation ([place])    a fib cardioverion done 2018   Pneumonia 09/2016   Rheumatoid arthritis ([place])    "a little bit qwhere" (07/22/2017)   Shortness of breath    with heavy  exertion   Tobacco abuse     Past Surgical History:  Procedure Laterality Date   A-FLUTTER ABLATION N/A 12/29/2016   Procedure: A-Flutter Ablation;  Surgeon: [name], MD;  Location: [facility];  Service: Cardiovascular;  Laterality: N/A;   CARDIOVERSION N/A 11/05/2016   Procedure: CARDIOVERSION;  Surgeon: Sueanne Margarita, MD;  Location: [facility];  Service: Cardiovascular;  Laterality: N/A;   IR THORACENTESIS ASP PLEURAL SPACE W/IMG GUIDE  10/01/2016   JOINT REPLACEMENT     Right hip and left knee   MICROLARYNGOSCOPY  09/02/2007   with excision of right vocal cord mass, Dr. Constance Holster   PROSTATE BIOPSY N/A 02/22/2020   Procedure: BIOPSY TRANSRECTAL ULTRASONIC PROSTATE (TUBP);  Surgeon: Irine Seal, MD;  Location: [facility];  Service: Urology;  Laterality: N/A;   TOTAL HIP ARTHROPLASTY Right 11/17/2017   Procedure: RIGHT TOTAL HIP ARTHROPLASTY ANTERIOR APPROACH;  Surgeon: Leandrew Koyanagi, MD;  Location: [place];  Service: Orthopedics;  Laterality: Right;   TOTAL HIP ARTHROPLASTY Left 03/07/2018   Procedure: LEFT TOTAL HIP ARTHROPLASTY ANTERIOR APPROACH;  Surgeon: Leandrew Koyanagi,  MD;  Location: [place];  Service: Orthopedics;  Laterality: Left;   TOTAL KNEE ARTHROPLASTY Left 07/22/2017   Procedure: LEFT TOTAL KNEE ARTHROPLASTY;  Surgeon: Leandrew Koyanagi, MD;  Location: [place];  Service: Orthopedics;  Laterality: Left;   TRANSURETHRAL RESECTION OF BLADDER TUMOR N/A 04/02/2020   Procedure: RESTAGING TRANSURETHRAL RESECTION OF BLADDER TUMOR (TURBT);  Surgeon: Irine Seal, MD;  Location[facility];  Service: Urology;  Laterality: N/A;  GENERAL ANESTHESIA WIHT PARALYSIS   TRANSURETHRAL RESECTION OF BLADDER TUMOR WITH MITOMYCIN-C N/A 02/22/2020   Procedure: TRANSURETHRAL RESECTION OF BLADDER TUMOR  WITH   GEMCITABINE;  Surgeon: Irine Seal, MD;  Location: [facility];  Service: Urology;  Laterality: N/A;    Current Medications: Current Meds  Medication Sig   cephALEXin (KEFLEX) 500 MG capsule Take 500 mg by mouth daily.   diltiazem (CARDIZEM CD) 120 MG 24 hr capsule Take 1 capsule (120 mg total) by mouth daily.   doxycycline (VIBRA-TABS) 100 MG tablet Take 100 mg by mouth 2 (two) times daily.   esomeprazole (NEXIUM) 20 MG capsule Take 20 mg by mouth daily at 12 noon.   furosemide (LASIX) 40 MG tablet TAKE 1 TABLET (40 MG TOTAL) BY MOUTH DAILY. PLEASE KEEP UPCOMING APPOINTMENT   hydroxychloroquine (PLAQUENIL) 200 MG tablet Take 200 mg by mouth 2 (two) times daily.   KLOR-CON M20 20 MEQ tablet TAKE 1 TABLET BY MOUTH EVERY DAY   methotrexate (RHEUMATREX) 2.5 MG tablet Take 20 mg by mouth every Friday. In the morning.   senna-docusate (SENOKOT S) 8.6-50 MG tablet Take 1 tablet by mouth at bedtime as needed.   tamsulosin (FLOMAX) 0.4 MG CAPS capsule Take 0.4 mg by mouth at bedtime.   [DISCONTINUED] XARELTO 20 MG TABS tablet TAKE 1 TABLET BY MOUTH DAILY WITH SUPPER.     Allergies:   Tape   Social History   Socioeconomic History   Marital status: Divorced    Spouse name: Not on file   Number of children: 3   Years of education: Not on file   Highest education level: Not on file  Occupational History   Not on file  Tobacco Use   Smoking status: Every Day    Packs/day: 0.13    Years: 45.00    Total pack years: 5.85    Types: Cigarettes   Smokeless tobacco: Never   Tobacco comments:    "quit off and on"  Vaping Use   Vaping Use: Never used  Substance and Sexual Activity   Alcohol use: Yes    Comment: 07/22/2017 "nothing since 2016"   Drug use: No    Comment: 07/22/2017 "none since 11/2016" cocaine (sister is not aware)   Sexual activity: Not Currently  Other Topics Concern   Not on file  Social History Narrative   Lives in [place]    Works at a  convenience store   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   Divorced with 3 children and 9 grandchildren, he states ex-wife is still a friend   Investment banker, operational of Sales executive: Not on file  Food Insecurity: Not on file  Transportation Needs: Not on file  Physical Activity: Not on file  Stress: Not on file  Social Connections: Not on file     Family History: The patient's family history includes Cancer in his father and mother; Diabetes in his sister; Heart attack in his father; Heart disease in his father.  ROS:   Please see the  history of present illness.    (+) Fatigue (+) Easy bruising All other systems reviewed and are negative.  EKGs/Labs/Other Studies Reviewed:    The following studies were reviewed today:  LE Venous Doppler  03/18/2018 Final Interpretation:  Right: No evidence of common femoral vein obstruction.  Left: Findings consistent with age indeterminate deep vein thrombosis  involving the left posterior tibial vein. No cystic structure found in the  popliteal fossa.   A-Flutter Ablation  12/29/2016: CONCLUSIONS:  1. Sinus rhythm upon presentation though afib was observed with anesthesia induction requiring cardioverison. 2. The clinical tachycardia was induced.  This was found to be Isthmus-dependent counter clockwise right atrial flutter upon presentation, successfully ablated along the usual cavotricuspid isthmus.  3. Complete bidirectional cavotricuspid isthmus block achieved.  4. No inducible arrhythmias following ablation.  5. No early apparent complications.  Echo 2017-trivial aortic regurgitation, EF 60% lipomatous hypertrophy of septum.   EKG:  EKG is personally reviewed. 01/08/2022:  Atrial fibrillation. Rate 94 bpm.   Recent Labs: No results found for requested labs within last 365 days.   Recent Lipid Panel    Component Value Date/Time   CHOL 148 02/21/2020 0902   TRIG 133 02/21/2020 0902   HDL 45 02/21/2020 0902   CHOLHDL  3.3 02/21/2020 0902   CHOLHDL 4.0 09/28/2015 0402   VLDL 25 09/28/2015 0402   LDLCALC 79 02/21/2020 0902     Risk Assessment/Calculations:      Physical Exam:    VS:  BP 140/80 (BP Location: Left Arm, Patient Position: Sitting, Cuff Size: Normal)   Pulse 94   Ht '5\' 10"'$  (1.778 m)   Wt 237 lb (107.5 kg)   BMI 34.01 kg/m     Wt Readings from Last 3 Encounters:  01/08/22 237 lb (107.5 kg)  12/02/20 254 lb (115.2 kg)  06/06/20 261 lb (118.4 kg)     GEN:  Well nourished, well developed in no acute distress, ambulates with a cane HEENT: Normal NECK: No JVD; No carotid bruits LYMPHATICS: No lymphadenopathy CARDIAC:  Irregularly irregular, no murmurs, rubs, gallops RESPIRATORY:  Clear to auscultation without rales, wheezing or rhonchi  ABDOMEN: Soft, non-tender, non-distended MUSCULOSKELETAL:  No edema; No deformity  SKIN: Warm and dry; Ecchymosis of LUE NEUROLOGIC:  Alert and oriented x 3 PSYCHIATRIC:  Normal affect   ASSESSMENT:    1. Persistent atrial fibrillation ([place])   2. Tobacco abuse   3. Malignant neoplasm of urinary bladder, unspecified site ([place])   4. Chronic diastolic heart failure (HCC)     PLAN:    In order of problems listed above:  Persistent atrial fibrillation (HCC) Atrial fibrillation ablation 2018. 2020 Xarelto was stopped because of bleeding hematuria.  He was offered implantable loop but declined. After bleeding subsided, Xarelto was restarted.  However, expense became an issue.  Bladder cancer was discovered and treated with BCG therapy by Dr. Jeffie Pollock.  He is now back in atrial fibrillation as of 01/08/2022 on EKG with heart rate of 94 bpm at rest.  We will go ahead and give him Cardizem CD 120 mg once a day.  We cannot perform a cardioversion at this time because he is undergoing cystoscopy at the end of this month.  He has to hold his Xarelto for this.  He has felt some decrease in energy but overall is not severely symptomatic.  After he is done  with his urologic treatments, we can consider cardioversion at that time.  He has also stated that the Xarelto  once again is expensive for him.  He wants sent in for him however.  He sent in paperwork to [facility] and he is waiting to hear back.  Hemoglobin 13.0 creatinine 0.7  Tobacco abuse Continue to encourage tobacco cessation.  Bladder cancer [facility]) Urology notes reviewed.  If he needs yearly treatments, may need to hold Xarelto transiently for this.  Chronic diastolic heart failure (HCC) Normal ejection fraction on prior echocardiogram.  Continues with Lasix 40 mg a day.  NYHA class I.  Continue to encourage daily exercise.     Follow-up:  3 months with APP.  Medication Adjustments/Labs and Tests Ordered: Current medicines are reviewed at length with the patient today.  Concerns regarding medicines are outlined above.   Orders Placed This Encounter  Procedures   EKG 12-Lead   Meds ordered this encounter  Medications   rivaroxaban (XARELTO) 20 MG TABS tablet    Sig: Take 1 tablet (20 mg total) by mouth daily with supper.    Dispense:  30 tablet    Refill:  6   diltiazem (CARDIZEM CD) 120 MG 24 hr capsule    Sig: Take 1 capsule (120 mg total) by mouth daily.    Dispense:  90 capsule    Refill:  3   Patient Instructions  Medication Instructions:  Please start Diltiazem CD 120 mg once a day. Continue all other medications as listed.  *If you need a refill on your cardiac medications before your next appointment, please call your pharmacy*  Follow-Up: At [facility], you and your health needs are our priority.  As part of our continuing mission to provide you with exceptional heart care, we have created designated Provider Care Teams.  These Care Teams include your primary Cardiologist (physician) and Advanced Practice Providers (APPs -  Physician Assistants and Nurse Practitioners) who all work together to provide you with the care you need, when you need  it.  We recommend signing up for the patient portal called "MyChart".  Sign up information is provided on this After Visit Summary.  MyChart is used to connect with patients for Virtual Visits (Telemedicine).  Patients are able to view lab/test results, encounter notes, upcoming appointments, etc.  Non-urgent messages can be sent to your provider as well.   To learn more about what you can do with MyChart, go to [email or web address].    Your next appointment:   3 month(s)  The format for your next appointment:   In Person  Provider:   Robbie Lis, PA-C, Nicholes Rough, PA-C, Melina Copa, PA-C, Ambrose Pancoast, NP, Ermalinda Barrios, PA-C, Christen Bame, NP, or Richardson Dopp, PA-C     {   Important Information About Sugar         I,Mathew Stumpf,acting as a scribe for Candee Furbish, MD.,have documented all relevant documentation on the behalf of Candee Furbish, MD,as directed by  Candee Furbish, MD while in the presence of Candee Furbish, MD.  I, Candee Furbish, MD, have reviewed all documentation for this visit. The documentation on 01/08/22 for the exam, diagnosis, procedures, and orders are all accurate and complete.   Signed, Candee Furbish, MD  01/08/2022 3:07 PM    [place]

## 2022-01-08 NOTE — Assessment & Plan Note (Signed)
Continue to encourage tobacco cessation. 

## 2022-01-13 DIAGNOSIS — M1991 Primary osteoarthritis, unspecified site: Secondary | ICD-10-CM | POA: Diagnosis not present

## 2022-01-13 DIAGNOSIS — M2550 Pain in unspecified joint: Secondary | ICD-10-CM | POA: Diagnosis not present

## 2022-01-13 DIAGNOSIS — D508 Other iron deficiency anemias: Secondary | ICD-10-CM | POA: Diagnosis not present

## 2022-01-13 DIAGNOSIS — Z6834 Body mass index (BMI) 34.0-34.9, adult: Secondary | ICD-10-CM | POA: Diagnosis not present

## 2022-01-13 DIAGNOSIS — M0579 Rheumatoid arthritis with rheumatoid factor of multiple sites without organ or systems involvement: Secondary | ICD-10-CM | POA: Diagnosis not present

## 2022-01-13 DIAGNOSIS — E669 Obesity, unspecified: Secondary | ICD-10-CM | POA: Diagnosis not present

## 2022-01-13 DIAGNOSIS — D090 Carcinoma in situ of bladder: Secondary | ICD-10-CM | POA: Diagnosis not present

## 2022-01-13 DIAGNOSIS — R5382 Chronic fatigue, unspecified: Secondary | ICD-10-CM | POA: Diagnosis not present

## 2022-01-19 ENCOUNTER — Other Ambulatory Visit: Payer: Self-pay | Admitting: Cardiology

## 2022-01-19 DIAGNOSIS — Z8551 Personal history of malignant neoplasm of bladder: Secondary | ICD-10-CM | POA: Diagnosis not present

## 2022-01-19 DIAGNOSIS — N3020 Other chronic cystitis without hematuria: Secondary | ICD-10-CM | POA: Diagnosis not present

## 2022-01-19 DIAGNOSIS — R351 Nocturia: Secondary | ICD-10-CM | POA: Diagnosis not present

## 2022-01-19 DIAGNOSIS — N401 Enlarged prostate with lower urinary tract symptoms: Secondary | ICD-10-CM | POA: Diagnosis not present

## 2022-01-20 DIAGNOSIS — M0579 Rheumatoid arthritis with rheumatoid factor of multiple sites without organ or systems involvement: Secondary | ICD-10-CM | POA: Diagnosis not present

## 2022-01-21 ENCOUNTER — Other Ambulatory Visit: Payer: Self-pay | Admitting: Cardiology

## 2022-02-03 DIAGNOSIS — C673 Malignant neoplasm of anterior wall of bladder: Secondary | ICD-10-CM | POA: Diagnosis not present

## 2022-02-03 DIAGNOSIS — Z5111 Encounter for antineoplastic chemotherapy: Secondary | ICD-10-CM | POA: Diagnosis not present

## 2022-02-10 DIAGNOSIS — C673 Malignant neoplasm of anterior wall of bladder: Secondary | ICD-10-CM | POA: Diagnosis not present

## 2022-02-10 DIAGNOSIS — Z5111 Encounter for antineoplastic chemotherapy: Secondary | ICD-10-CM | POA: Diagnosis not present

## 2022-02-17 DIAGNOSIS — C673 Malignant neoplasm of anterior wall of bladder: Secondary | ICD-10-CM | POA: Diagnosis not present

## 2022-02-17 DIAGNOSIS — Z5111 Encounter for antineoplastic chemotherapy: Secondary | ICD-10-CM | POA: Diagnosis not present

## 2022-02-23 DIAGNOSIS — M0579 Rheumatoid arthritis with rheumatoid factor of multiple sites without organ or systems involvement: Secondary | ICD-10-CM | POA: Diagnosis not present

## 2022-04-13 DIAGNOSIS — M0579 Rheumatoid arthritis with rheumatoid factor of multiple sites without organ or systems involvement: Secondary | ICD-10-CM | POA: Diagnosis not present

## 2022-04-13 DIAGNOSIS — Z79899 Other long term (current) drug therapy: Secondary | ICD-10-CM | POA: Diagnosis not present

## 2022-04-16 ENCOUNTER — Ambulatory Visit: Payer: Medicare Other | Admitting: Nurse Practitioner

## 2022-04-16 DIAGNOSIS — D508 Other iron deficiency anemias: Secondary | ICD-10-CM | POA: Diagnosis not present

## 2022-04-16 DIAGNOSIS — Z6835 Body mass index (BMI) 35.0-35.9, adult: Secondary | ICD-10-CM | POA: Diagnosis not present

## 2022-04-16 DIAGNOSIS — R5382 Chronic fatigue, unspecified: Secondary | ICD-10-CM | POA: Diagnosis not present

## 2022-04-16 DIAGNOSIS — M0579 Rheumatoid arthritis with rheumatoid factor of multiple sites without organ or systems involvement: Secondary | ICD-10-CM | POA: Diagnosis not present

## 2022-04-16 DIAGNOSIS — M2550 Pain in unspecified joint: Secondary | ICD-10-CM | POA: Diagnosis not present

## 2022-04-16 DIAGNOSIS — M1991 Primary osteoarthritis, unspecified site: Secondary | ICD-10-CM | POA: Diagnosis not present

## 2022-04-16 DIAGNOSIS — E669 Obesity, unspecified: Secondary | ICD-10-CM | POA: Diagnosis not present

## 2022-04-16 DIAGNOSIS — D090 Carcinoma in situ of bladder: Secondary | ICD-10-CM | POA: Diagnosis not present

## 2022-04-27 ENCOUNTER — Encounter: Payer: Self-pay | Admitting: Physician Assistant

## 2022-04-27 ENCOUNTER — Ambulatory Visit: Payer: Medicare Other | Admitting: Nurse Practitioner

## 2022-04-27 NOTE — Progress Notes (Unsigned)
Cardiology Office Note    Date:  04/29/2022   ID:  Brian [name], DOB 12/08/1950, MRN [id number]  PCP:  Orpah Melter, MD  Cardiologist:  Candee Furbish, MD  Electrophysiologist:  [name], MD previously  Chief Complaint: f/u atrial fib  History of Present Illness:   Brian [name] is a 71 y.o. male with history of PAF, atrial flutter, GERD, kidney stones, HTN, arthritis, tobacco abuse,  cocaine use, bladder cancer with prior hematuria, prior HFpEF, anemia who is seen for follow-up.  He was remotely diagnosed with atrial flutter and associated diastolic CHF many years back. He was trialed on amiodarone but failed to restore rhythm. In 2018 he underwent atrial flutter ablation and was followed by Dr. Rayann Heman for a period of time. Following his ablation he did develop atrial fibrillation in the setting of sepsis. ILR was considered to evaluate burden but not pursued. Afib resolved by time of [facility] f/u in 2020 and not pursued. In 2021 he was seen back for pre-op eval for bladder tumor surgery at which time patient had self-discontinued anticoagulation due to cost. It was subsequently restarted. He was in NSR at that time. 2021 EKG showed NSR. He was seen in 11/2020, felt to be clinically stable, presence of sinus rhythm not mentioned but RRR in exam and no acute concerns reported at visit. In followup 12/2021 he was noted to be in atrial fibrillation and was started on diltiazem. Cardioversion was deferred in the setting of ongoing urologic evaluations/cystoscopy requiring interruption in anticoagulation. Last echo 2017 showed EF 55-60%, increased atrial septal thickness c/w lipomatous hypertrophy.   He returns for follow-up still in afib/flutter. He does believe he may have missed a dose of his medicines perhaps 2 weeks ago, date not fully known. He has previously reported difficulty affording Xarelto and states some days he wants to go off of this completely. He reports he had submitted  paperwork to [employer] and says he had not heard back. He reports tobacco abuse when he wants to. He reports h/o cocaine with last reported use 1 year prior in order to engage in intercourse. He reports a history of generalized fatigue described as "giving out"/dyspnea with exertion that has been going on a long time but worse in the last year. He is getting periodic BCG injections but states he does not have to hold anticoagulation for this; he states he is in remission now and next f/u with urology is in 07/2022.  Labwork independently reviewed: 03/2020 K 4.6, Cr 0.9 01/2020 Hgb 14.9, LDL 79, trig 133  Cardiology Studies:   Studies reviewed are outlined and summarized above. Reports included below if pertinent.   Echo 2017   - Left ventricle: The cavity size was normal. Systolic function was    normal. The estimated ejection fraction was in the range of 55%    to 60%. Wall motion was normal; there were no regional wall    motion abnormalities. Left ventricular diastolic function    parameters were normal.  - Aortic valve: There was trivial regurgitation. Valve area (VTI):    1.79 cm^2. Valve area (Vmax): 2.01 cm^2. Valve area (Vmean): 2    cm^2.  - Atrial septum: There was increased thickness of the septum,    consistent with lipomatous hypertrophy.     Past Medical History:  Diagnosis Date   Anemia    none since 2019   Anxiety    Atrial flutter ([place])    a. 08/2012   CHF (congestive  heart failure) (HCC)    chronic diastolic chf, pt denies   GERD (gastroesophageal reflux disease)    Gout    pt denies   History of kidney stones    passed   Hypertension    Obesity    Paroxysmal atrial fibrillation ([place])    Pneumonia 09/2016   Rheumatoid arthritis ([place])    "a little bit qwhere" (07/22/2017)   Shortness of breath    with heavy  exertion   Tobacco abuse     Past Surgical History:  Procedure Laterality Date   A-FLUTTER ABLATION N/A 12/29/2016   Procedure: A-Flutter Ablation;   Surgeon: [name], MD;  Location: [facility];  Service: Cardiovascular;  Laterality: N/A;   CARDIOVERSION N/A 11/05/2016   Procedure: CARDIOVERSION;  Surgeon: Sueanne Margarita, MD;  [facility];  Service: Cardiovascular;  Laterality: N/A;   IR THORACENTESIS ASP PLEURAL SPACE W/IMG GUIDE  10/01/2016   JOINT REPLACEMENT     Right hip and left knee   MICROLARYNGOSCOPY  09/02/2007   with excision of right vocal cord mass, Dr. Constance Holster   PROSTATE BIOPSY N/A 02/22/2020   Procedure: BIOPSY TRANSRECTAL ULTRASONIC PROSTATE (TUBP);  Surgeon: Irine Seal, MD;  [facility];  Service: Urology;  Laterality: N/A;   TOTAL HIP ARTHROPLASTY Right 11/17/2017   Procedure: RIGHT TOTAL HIP ARTHROPLASTY ANTERIOR APPROACH;  Surgeon: Leandrew Koyanagi, MD;  Location: [facility];  Service: Orthopedics;  Laterality: Right;   TOTAL HIP ARTHROPLASTY Left 03/07/2018   Procedure: LEFT TOTAL HIP ARTHROPLASTY ANTERIOR APPROACH;  Surgeon: Leandrew Koyanagi, MD;  Location: [place];  Service: Orthopedics;  Laterality: Left;   TOTAL KNEE ARTHROPLASTY Left 07/22/2017   Procedure: LEFT TOTAL KNEE ARTHROPLASTY;  Surgeon: Leandrew Koyanagi, MD;  Location: [place];  Service: Orthopedics;  Laterality: Left;   TRANSURETHRAL RESECTION OF BLADDER TUMOR N/A 04/02/2020   Procedure: RESTAGING TRANSURETHRAL RESECTION OF BLADDER TUMOR (TURBT);  Surgeon: Irine Seal, MD;  [facility];  Service: Urology;  Laterality: N/A;  GENERAL ANESTHESIA WIHT PARALYSIS   TRANSURETHRAL RESECTION OF BLADDER TUMOR WITH MITOMYCIN-C N/A 02/22/2020   Procedure: TRANSURETHRAL RESECTION OF BLADDER TUMOR WITH   GEMCITABINE;  Surgeon: Irine Seal, MD;  Location: [facility];  Service: Urology;  Laterality: N/A;    Current Medications: Current Meds  Medication Sig   diltiazem (CARDIZEM CD) 120 MG 24 hr capsule Take 1 capsule (120 mg total) by mouth daily.   esomeprazole (NEXIUM) 20 MG capsule Take 20 mg by mouth  daily at 12 noon.   furosemide (LASIX) 40 MG tablet Take 1 tablet (40 mg total) by mouth daily.   hydroxychloroquine (PLAQUENIL) 200 MG tablet Take 200 mg by mouth 2 (two) times daily.   KLOR-CON M20 20 MEQ tablet TAKE 1 TABLET BY MOUTH EVERY DAY   methotrexate (RHEUMATREX) 2.5 MG tablet Take 20 mg by mouth every Friday. In the morning.   rivaroxaban (XARELTO) 20 MG TABS tablet Take 1 tablet (20 mg total) by mouth daily with supper.   senna-docusate (SENOKOT S) 8.6-50 MG tablet Take 1 tablet by mouth at bedtime as needed.   tamsulosin (FLOMAX) 0.4 MG CAPS capsule Take 0.4 mg by mouth at bedtime.   cephALEXin (KEFLEX) 500 MG capsule Take 500 mg by mouth daily.      Allergies:   Aquacel-ag surgical hydrofiber [wound dressings] and Tape   Social History   Socioeconomic History   Marital status: Divorced    Spouse  name: Not on file   Number of children: 3   Years of education: Not on file   Highest education level: Not on file  Occupational History   Not on file  Tobacco Use   Smoking status: Every Day    Packs/day: 0.13    Years: 45.00    Total pack years: 5.85    Types: Cigarettes   Smokeless tobacco: Never   Tobacco comments:    "quit off and on"  Vaping Use   Vaping Use: Never used  Substance and Sexual Activity   Alcohol use: Yes    Comment: 07/22/2017 "nothing since 2016"   Drug use: No    Comment: 07/22/2017 "none since 11/2016" cocaine (sister is not aware)   Sexual activity: Not Currently  Other Topics Concern   Not on file  Social History Narrative   Lives in [place]    Works at a convenience store   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   Divorced with 3 children and 9 grandchildren, he states ex-wife is still a friend   Investment banker, operational of Sales executive: Not on file  Food Insecurity: Not on file  Transportation Needs: Not on file  Physical Activity: Not on file  Stress: Not on file  Social Connections: Not on file     Family History:   The patient's family history includes Cancer in his father and mother; Diabetes in his sister; Heart attack in his father; Heart disease in his father.  ROS:   Please see the history of present illness. All other systems are reviewed and otherwise negative.    EKG(s)/Additional Labs   EKG:  EKG is ordered today, personally reviewed, demonstrating coarse atrial fib/flutter 95bpm no acute STT changes  Recent Labs: No results found for requested labs within last 365 days.  Recent Lipid Panel    Component Value Date/Time   CHOL 148 02/21/2020 0902   TRIG 133 02/21/2020 0902   HDL 45 02/21/2020 0902   CHOLHDL 3.3 02/21/2020 0902   CHOLHDL 4.0 09/28/2015 0402   VLDL 25 09/28/2015 0402   LDLCALC 79 02/21/2020 0902    PHYSICAL EXAM:    VS:  BP 122/78   Pulse 95   Ht 5' 9.5" (1.765 m)   Wt 253 lb 12.8 oz (115.1 kg)   SpO2 96%   BMI 36.94 kg/m   BMI: Body mass index is 36.94 kg/m.  GEN: Well nourished, well developed male in no acute distress HEENT: normocephalic, atraumatic Neck: no JVD, carotid bruits, or masses Cardiac: irregularly irregular, no murmurs, rubs, or gallops, no edema  Respiratory: diminished throughout but no wheezing, rales or rhonchi, normal work of breathing GI: soft, nontender, nondistended, + BS MS: no deformity or atrophy Skin: warm and dry, no rash Neuro:  Alert and Oriented x 3, Strength and sensation are intact, follows commands Psych: euthymic mood, full affect  Wt Readings from Last 3 Encounters:  04/29/22 253 lb 12.8 oz (115.1 kg)  01/08/22 237 lb (107.5 kg)  12/02/20 254 lb (115.2 kg)     ASSESSMENT & PLAN:   1. Persistent atrial fibrillation, paroxysmal atrial flutter - remains out of rhythm. Unfortunately he missed a dose of Xarelto since last OV but is not quite sure on the date. He also reports some days he would like to stop blood thinner altogether. He is agreeable to continuing from this day forward without any missed doses so that  the idea of cardioversion can be entertained at subsequent  visit. CMA will send msg to pt assistance RN to see if there is anything she can do to help with paperwork. We discussed eval for OSA and he declined at this time. He will let us know if he changes his mind. Will update basic labs today including CBC, CMET, Mg, TSH. Give his complex h/o arrhythmia as above, would recommend he see EP in follow-up to help guide plans for cardioversion +/- whether AAD needs to be considered. We also discussed avoidance of harmful substances.   2. DOE - chronic with some interval worsening x several months. Ongoing tobacco use noted. Will obtain labs as above plus BNP, as well as f/u 2V CXR and 2D Echocardiogram. May have some relationship to his atrial arrhythmias as well. If this persists despite potential restoration of NSR (to be determined in #1), consider evaluation for coronary disease though he adamantly denies chest pain at this time.  3. Chronic HFpEF - volume status looks OK, though NYHA class 2-3 symptoms. Update echo and BNP.  4. Polysubstance abuse - prior h/o tobacco, cocaine, discussed avoidance of harmful substances.    Disposition: Refer to EP as above. Appreciate their input.  Medication Adjustments/Labs and Tests Ordered: Current medicines are reviewed at length with the patient today.  Concerns regarding medicines are outlined above. Medication changes, Labs and Tests ordered today are summarized above and listed in the Patient Instructions accessible in Encounters.   Signed, Charlie Pitter, PA-C  04/29/2022 9:14 AM    [place] Phone: [phone]; Fax: [phone]

## 2022-04-29 ENCOUNTER — Encounter: Payer: Self-pay | Admitting: Physician Assistant

## 2022-04-29 ENCOUNTER — Ambulatory Visit: Payer: Medicare Other | Attending: Nurse Practitioner | Admitting: Physician Assistant

## 2022-04-29 VITALS — BP 122/78 | HR 95 | Ht 69.5 in | Wt 253.8 lb

## 2022-04-29 DIAGNOSIS — Z720 Tobacco use: Secondary | ICD-10-CM | POA: Insufficient documentation

## 2022-04-29 DIAGNOSIS — I5032 Chronic diastolic (congestive) heart failure: Secondary | ICD-10-CM | POA: Diagnosis not present

## 2022-04-29 DIAGNOSIS — I4892 Unspecified atrial flutter: Secondary | ICD-10-CM | POA: Diagnosis not present

## 2022-04-29 DIAGNOSIS — I4819 Other persistent atrial fibrillation: Secondary | ICD-10-CM | POA: Insufficient documentation

## 2022-04-29 DIAGNOSIS — R0609 Other forms of dyspnea: Secondary | ICD-10-CM | POA: Diagnosis not present

## 2022-04-29 NOTE — Patient Instructions (Signed)
Medication Instructions:  Your physician recommends that you continue on your current medications as directed. Please refer to the Current Medication list given to you today. *If you need a refill on your cardiac medications before your next appointment, please call your pharmacy*   Lab Work: TODAY-CMET, MAG, BNP, CBC, TSH If you have labs (blood work) drawn today and your tests are completely normal, you will receive your results only by: [place] (if you have MyChart) OR A paper copy in the mail If you have any lab test that is abnormal or we need to change your treatment, we will call you to review the results.   Testing/Procedures: Your physician has requested that you have an echocardiogram. Echocardiography is a painless test that uses sound waves to create images of your heart. It provides your doctor with information about the size and shape of your heart and how well your heart's chambers and valves are working. This procedure takes approximately one hour. There are no restrictions for this procedure. Please do NOT wear cologne, perfume, aftershave, or lotions (deodorant is allowed). Please arrive 15 minutes prior to your appointment time.   A chest x-ray takes a picture of the organs and structures inside the chest, including the heart, lungs, and blood vessels. This test can show several things, including, whether the heart is enlarges; whether fluid is building up in the lungs; and whether pacemaker / defibrillator leads are still in place.   Follow-Up: At [facility], you and your health needs are our priority.  As part of our continuing mission to provide you with exceptional heart care, we have created designated Provider Care Teams.  These Care Teams include your primary Cardiologist (physician) and Advanced Practice Providers (APPs -  Physician Assistants and Nurse Practitioners) who all work together to provide you with the care you need, when you need  it.  We recommend signing up for the patient portal called "MyChart".  Sign up information is provided on this After Visit Summary.  MyChart is used to connect with patients for Virtual Visits (Telemedicine).  Patients are able to view lab/test results, encounter notes, upcoming appointments, etc.  Non-urgent messages can be sent to your provider as well.   To learn more about what you can do with MyChart, go to [email or web address].    Your next appointment:   FIRST AVAILABLE WITH DR [facility]   The format for your next appointment:   In Person  Provider:   Doralee Albino, MD    Other Instructions You have been referred to ELECTROPHYSIOLOGIST    Important Information About Sugar

## 2022-04-30 LAB — COMPREHENSIVE METABOLIC PANEL
ALT: 27 IU/L (ref 0–44)
AST: 21 IU/L (ref 0–40)
Albumin/Globulin Ratio: 1.7 (ref 1.2–2.2)
Albumin: 4.7 g/dL (ref 3.8–4.8)
Alkaline Phosphatase: 77 IU/L (ref 44–121)
BUN/Creatinine Ratio: 14 (ref 10–24)
BUN: 11 mg/dL (ref 8–27)
Bilirubin Total: 0.4 mg/dL (ref 0.0–1.2)
CO2: 26 mmol/L (ref 20–29)
Calcium: 9.4 mg/dL (ref 8.6–10.2)
Chloride: 102 mmol/L (ref 96–106)
Creatinine, Ser: 0.78 mg/dL (ref 0.76–1.27)
Globulin, Total: 2.7 g/dL (ref 1.5–4.5)
Glucose: 107 mg/dL — ABNORMAL HIGH (ref 70–99)
Potassium: 5 mmol/L (ref 3.5–5.2)
Sodium: 141 mmol/L (ref 134–144)
Total Protein: 7.4 g/dL (ref 6.0–8.5)
eGFR: 95 mL/min/{1.73_m2} (ref >59)

## 2022-04-30 LAB — CBC
Hematocrit: 43.8 % (ref 37.5–51.0)
Hemoglobin: 13.9 g/dL (ref 13.0–17.7)
MCH: 28.8 pg (ref 26.6–33.0)
MCHC: 31.7 g/dL (ref 31.5–35.7)
MCV: 91 fL (ref 79–97)
Platelets: 299 10*3/uL (ref 150–450)
RBC: 4.83 x10E6/uL (ref 4.14–5.80)
RDW: 14.5 % (ref 11.6–15.4)
WBC: 8.9 10*3/uL (ref 3.4–10.8)

## 2022-04-30 LAB — TSH: TSH: 1.69 u[IU]/mL (ref 0.450–4.500)

## 2022-04-30 LAB — PRO B NATRIURETIC PEPTIDE: NT-Pro BNP: 136 pg/mL (ref 0–376)

## 2022-04-30 LAB — MAGNESIUM: Magnesium: 2.1 mg/dL (ref 1.6–2.3)

## 2022-05-01 NOTE — Addendum Note (Signed)
Addended by: Drue Novel I on: 05/01/2022 04:12 PM   Modules accepted: Orders

## 2022-05-18 ENCOUNTER — Ambulatory Visit (HOSPITAL_COMMUNITY): Payer: Medicare Other | Attending: Physician Assistant

## 2022-05-18 DIAGNOSIS — I4892 Unspecified atrial flutter: Secondary | ICD-10-CM | POA: Diagnosis not present

## 2022-05-18 DIAGNOSIS — I4819 Other persistent atrial fibrillation: Secondary | ICD-10-CM | POA: Insufficient documentation

## 2022-05-18 DIAGNOSIS — R0609 Other forms of dyspnea: Secondary | ICD-10-CM | POA: Diagnosis not present

## 2022-05-18 DIAGNOSIS — Z720 Tobacco use: Secondary | ICD-10-CM | POA: Diagnosis not present

## 2022-05-18 DIAGNOSIS — I5032 Chronic diastolic (congestive) heart failure: Secondary | ICD-10-CM | POA: Diagnosis not present

## 2022-05-18 LAB — ECHOCARDIOGRAM COMPLETE: S' Lateral: 3.6 cm

## 2022-05-18 MED ORDER — PERFLUTREN LIPID MICROSPHERE
1.0000 mL | INTRAVENOUS | Status: AC | PRN
  Administered 2022-05-18: 1 mL via INTRAVENOUS

## 2022-05-24 NOTE — H&P (View-Only) (Signed)
Electrophysiology Office Note:    Date:  05/25/2022   ID:  Brian [name], DOB 05-03-1951, MRN [id number]  PCP:  Orpah Melter, MD   [place] [facility] Cardiologist:  Candee Furbish, MD Electrophysiologist:  Melida Quitter, MD     Referring MD: Charlie Pitter, PA-C   Chief complaint: tired, can't do anything     History of Present Illness:    Brian [name] is a 71 y.o. male with a hx of PAF, atrial flutter, nephrolithiasis, HTN, cocaine use, tobacco use, HF with recovered EF referred for rhythm management.  He developed CHFpEF in the setting of atrial flutter that resolved with flutter ablation in 2018. He developed atrial fibrillation related to sepsis. He appears to have remained in sinus rhythm until follow-up in July, 2023. Cardioversion was deferred due to interruption of anticoagulation for serial urologic procedures regarding his bladder cancer.  He has been reluctant to take anticoagulation in the past.                                                                                                                                                    Past Medical History:  Diagnosis Date   Anemia    none since 2019   Anxiety    Atrial flutter ([place])    a. 08/2012   CHF (congestive heart failure) (HCC)    chronic diastolic chf, pt denies   GERD (gastroesophageal reflux disease)    Gout    pt denies   History of kidney stones    passed   Hypertension    Obesity    Paroxysmal atrial fibrillation ([place])    Pneumonia 09/2016   Rheumatoid arthritis ([place])    "a little bit qwhere" (07/22/2017)   Shortness of breath    with heavy  exertion   Tobacco abuse     Past Surgical History:  Procedure Laterality Date   A-FLUTTER ABLATION N/A 12/29/2016   Procedure: A-Flutter Ablation;  Surgeon: [name], MD;  Location: [facility];  Service: Cardiovascular;  Laterality: N/A;   CARDIOVERSION N/A 11/05/2016   Procedure: CARDIOVERSION;  Surgeon: Sueanne Margarita, MD;  [facility];  Service: Cardiovascular;  Laterality: N/A;   IR THORACENTESIS ASP PLEURAL SPACE W/IMG GUIDE  10/01/2016   JOINT REPLACEMENT     Right hip and left knee   MICROLARYNGOSCOPY  09/02/2007   with excision of right vocal cord mass, Dr. Constance Holster   PROSTATE BIOPSY N/A 02/22/2020   Procedure: BIOPSY TRANSRECTAL ULTRASONIC PROSTATE (TUBP);  Surgeon: Irine Seal, MD;  Location: [facility];  Service: Urology;  Laterality: N/A;   TOTAL HIP ARTHROPLASTY Right 11/17/2017   Procedure: RIGHT TOTAL HIP ARTHROPLASTY ANTERIOR APPROACH;  Surgeon: Leandrew Koyanagi, MD;  Location: [place];  Service: Orthopedics;  Laterality: Right;   TOTAL  HIP ARTHROPLASTY Left 03/07/2018   Procedure: LEFT TOTAL HIP ARTHROPLASTY ANTERIOR APPROACH;  Surgeon: Leandrew Koyanagi, MD;  Location: [place];  Service: Orthopedics;  Laterality: Left;   TOTAL KNEE ARTHROPLASTY Left 07/22/2017   Procedure: LEFT TOTAL KNEE ARTHROPLASTY;  Surgeon: Leandrew Koyanagi, MD;  Location: [place];  Service: Orthopedics;  Laterality: Left;   TRANSURETHRAL RESECTION OF BLADDER TUMOR N/A 04/02/2020   Procedure: RESTAGING TRANSURETHRAL RESECTION OF BLADDER TUMOR (TURBT);  Surgeon: Irine Seal, MD;  Location: [facility];  Service: Urology;  Laterality: N/A;  GENERAL ANESTHESIA WIHT PARALYSIS   TRANSURETHRAL RESECTION OF BLADDER TUMOR WITH MITOMYCIN-C N/A 02/22/2020   Procedure: TRANSURETHRAL RESECTION OF BLADDER TUMOR WITH   GEMCITABINE;  Surgeon: Irine Seal, MD;  Location: [facility];  Service: Urology;  Laterality: N/A;    Current Medications: Current Meds  Medication Sig   cephALEXin (KEFLEX) 250 MG capsule Take 250 mg by mouth at bedtime.   diltiazem (CARDIZEM CD) 120 MG 24 hr capsule Take 1 capsule (120 mg total) by mouth daily.   esomeprazole (NEXIUM) 20 MG capsule Take 20 mg by mouth daily at 12 noon.   folic acid (FOLVITE) 1 MG tablet Take 1 mg by mouth daily.   furosemide (LASIX) 40 MG  tablet Take 1 tablet (40 mg total) by mouth daily.   golimumab (SIMPONI ARIA) 50 MG/4ML SOLN injection '2mg'$ /kg Intravenous at week 0, 4 and q 8 weeks   hydroxychloroquine (PLAQUENIL) 200 MG tablet Take 200 mg by mouth 2 (two) times daily.   KLOR-CON M20 20 MEQ tablet TAKE 1 TABLET BY MOUTH EVERY DAY   methotrexate (RHEUMATREX) 2.5 MG tablet Take 20 mg by mouth every Friday. In the morning.   rivaroxaban (XARELTO) 20 MG TABS tablet Take 1 tablet (20 mg total) by mouth daily with supper.   senna-docusate (SENOKOT S) 8.6-50 MG tablet Take 1 tablet by mouth at bedtime as needed.   tamsulosin (FLOMAX) 0.4 MG CAPS capsule Take 0.4 mg by mouth at bedtime.     Allergies:   Aquacel-ag surgical hydrofiber [wound dressings] and Tape   Social History   Socioeconomic History   Marital status: Divorced    Spouse name: Not on file   Number of children: 3   Years of education: Not on file   Highest education level: Not on file  Occupational History   Not on file  Tobacco Use   Smoking status: Every Day    Packs/day: 0.13    Years: 45.00    Total pack years: 5.85    Types: Cigarettes   Smokeless tobacco: Never   Tobacco comments:    "quit off and on"  Vaping Use   Vaping Use: Never used  Substance and Sexual Activity   Alcohol use: Yes    Comment: 07/22/2017 "nothing since 2016"   Drug use: No    Comment: 07/22/2017 "none since 11/2016" cocaine (sister is not aware)   Sexual activity: Not Currently  Other Topics Concern   Not on file  Social History Narrative   Lives in [place]    Works at a convenience store   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   Divorced with 3 children and 9 grandchildren, he states ex-wife is still a friend   Scientist, physiological Strain: Not on file  Food Insecurity: Not on file  Transportation Needs: Not on file  Physical Activity: Not on file  Stress: Not on file  Social Connections: Not  on file     Family History: The patient's  family history includes Cancer in his father and mother; Diabetes in his sister; Heart attack in his father; Heart disease in his father.  ROS:   Please see the history of present illness.    All other systems reviewed and are negative.  EKGs/Labs/Other Studies Reviewed Today:    TTE 04/2022  1. Left ventricular ejection fraction, by estimation, is 50 to 55%. The  left ventricle has low normal function. The left ventricle has no regional  wall motion abnormalities. Left ventricular diastolic parameters are  indeterminate.   2. Right ventricular systolic function is normal. The right ventricular  size is normal. Tricuspid regurgitation signal is inadequate for assessing  PA pressure.   3. The mitral valve is normal in structure. No evidence of mitral valve  regurgitation. No evidence of mitral stenosis.   4. The aortic valve is normal in structure. Aortic valve regurgitation is  not visualized. No aortic stenosis is present.   5. The inferior vena cava is normal in size with greater than 50%  respiratory variability, suggesting right atrial pressure of 3 mmHg.   EKG:  Last EKG results: AF, Rightward axis, low voltage   Recent Labs: 04/29/2022: ALT 27; BUN 11; Creatinine, Ser 0.78; Hemoglobin 13.9; Magnesium 2.1; NT-Pro BNP 136; Platelets 299; Potassium 5.0; Sodium 141; TSH 1.690     Physical Exam:    VS:  BP (!) 148/68   Pulse 94   Ht 5' 9.5" (1.765 m)   Wt 256 lb (116.1 kg)   SpO2 95%   BMI 37.26 kg/m     Wt Readings from Last 3 Encounters:  05/25/22 256 lb (116.1 kg)  04/29/22 253 lb 12.8 oz (115.1 kg)  01/08/22 237 lb (107.5 kg)     GEN:  Well nourished, well developed in no acute distress CARDIAC: Irregular rhythm with normal rate, no murmurs, rubs, gallops RESPIRATORY:  Normal work of breathing MUSCULOSKELETAL: trace edema    ASSESSMENT & PLAN:    Persistent atrial fibrillation: symptomatic with NYHA II-III symptoms. I recommended rhythm control. We  discussed management options including antiarrhythmic medication and ablation. We discussed the indication, rationale, logistics, anticipated benefits, and potential risks of the ablation procedure including but not limited to -- bleed at the groin access site, chest pain, damage to nearby organs such as the diaphragm, lungs, or esophagus, need for a drainage tube, or prolonged hospitalization. I explained that the risk for stroke, heart attack, need for open chest surgery, or even death is very low but not zero. he  expressed understanding and wishes to proceed with ablation. Prior to ablation, which won't occur until next year, we will arrange cardioversion since he is very symptomatic.  Atrial flutter: s/p ablation  History of CHFpEF: I think he will do much better in sinus rhythm.        Medication Adjustments/Labs and Tests Ordered: Current medicines are reviewed at length with the patient today.  Concerns regarding medicines are outlined above.  Orders Placed This Encounter  Procedures   CT CARDIAC MORPH/PULM VEIN W/CM&W/O CA SCORE   Basic metabolic panel   CBC with Differential/Platelet   Basic metabolic panel   CBC with Differential/Platelet   EKG 12-Lead   No orders of the defined types were placed in this encounter.    Signed, Melida Quitter, MD  05/25/2022 8:54 AM    [place]

## 2022-05-24 NOTE — Progress Notes (Signed)
Electrophysiology Office Note:    Date:  05/25/2022   ID:  Brian [name], DOB 06/15/1951, MRN [id number]  PCP:  Orpah Melter, MD   [facility] Cardiologist:  Candee Furbish, MD Electrophysiologist:  Melida Quitter, MD     Referring MD: Charlie Pitter, PA-C   Chief complaint: tired, can't do anything     History of Present Illness:    Brian [name] is a 71 y.o. male with a hx of PAF, atrial flutter, nephrolithiasis, HTN, cocaine use, tobacco use, HF with recovered EF referred for rhythm management.  He developed CHFpEF in the setting of atrial flutter that resolved with flutter ablation in 2018. He developed atrial fibrillation related to sepsis. He appears to have remained in sinus rhythm until follow-up in July, 2023. Cardioversion was deferred due to interruption of anticoagulation for serial urologic procedures regarding his bladder cancer.  He has been reluctant to take anticoagulation in the past.                                                                                                                                                    Past Medical History:  Diagnosis Date   Anemia    none since 2019   Anxiety    Atrial flutter ([place])    a. 08/2012   CHF (congestive heart failure) (HCC)    chronic diastolic chf, pt denies   GERD (gastroesophageal reflux disease)    Gout    pt denies   History of kidney stones    passed   Hypertension    Obesity    Paroxysmal atrial fibrillation ([place])    Pneumonia 09/2016   Rheumatoid arthritis ([place])    "a little bit qwhere" (07/22/2017)   Shortness of breath    with heavy  exertion   Tobacco abuse     Past Surgical History:  Procedure Laterality Date   A-FLUTTER ABLATION N/A 12/29/2016   Procedure: A-Flutter Ablation;  Surgeon: [name], MD;  Location: [facility];  Service: Cardiovascular;  Laterality: N/A;   CARDIOVERSION N/A 11/05/2016   Procedure: CARDIOVERSION;  Surgeon: Sueanne Margarita, MD;  Location: [facility];  Service: Cardiovascular;  Laterality: N/A;   IR THORACENTESIS ASP PLEURAL SPACE W/IMG GUIDE  10/01/2016   JOINT REPLACEMENT     Right hip and left knee   MICROLARYNGOSCOPY  09/02/2007   with excision of right vocal cord mass, Dr. Constance Holster   PROSTATE BIOPSY N/A 02/22/2020   Procedure: BIOPSY TRANSRECTAL ULTRASONIC PROSTATE (TUBP);  Surgeon: Irine Seal, MD;  Location: [facility];  Service: Urology;  Laterality: N/A;   TOTAL HIP ARTHROPLASTY Right 11/17/2017   Procedure: RIGHT TOTAL HIP ARTHROPLASTY ANTERIOR APPROACH;  Surgeon: Leandrew Koyanagi, MD;  Location: [facility];  Service: Orthopedics;  Laterality: Right;   TOTAL  HIP ARTHROPLASTY Left 03/07/2018   Procedure: LEFT TOTAL HIP ARTHROPLASTY ANTERIOR APPROACH;  Surgeon: Leandrew Koyanagi, MD;  Location: [facility];  Service: Orthopedics;  Laterality: Left;   TOTAL KNEE ARTHROPLASTY Left 07/22/2017   Procedure: LEFT TOTAL KNEE ARTHROPLASTY;  Surgeon: Leandrew Koyanagi, MD;  Location: [facility];  Service: Orthopedics;  Laterality: Left;   TRANSURETHRAL RESECTION OF BLADDER TUMOR N/A 04/02/2020   Procedure: RESTAGING TRANSURETHRAL RESECTION OF BLADDER TUMOR (TURBT);  Surgeon: Irine Seal, MD;  Location: [facility];  Service: Urology;  Laterality: N/A;  GENERAL ANESTHESIA WIHT PARALYSIS   TRANSURETHRAL RESECTION OF BLADDER TUMOR WITH MITOMYCIN-C N/A 02/22/2020   Procedure: TRANSURETHRAL RESECTION OF BLADDER TUMOR WITH   GEMCITABINE;  Surgeon: Irine Seal, MD;  Location: [facility];  Service: Urology;  Laterality: N/A;    Current Medications: Current Meds  Medication Sig   cephALEXin (KEFLEX) 250 MG capsule Take 250 mg by mouth at bedtime.   diltiazem (CARDIZEM CD) 120 MG 24 hr capsule Take 1 capsule (120 mg total) by mouth daily.   esomeprazole (NEXIUM) 20 MG capsule Take 20 mg by mouth daily at 12 noon.   folic acid (FOLVITE) 1 MG tablet Take 1 mg by mouth daily.   furosemide (LASIX) 40 MG  tablet Take 1 tablet (40 mg total) by mouth daily.   golimumab (SIMPONI ARIA) 50 MG/4ML SOLN injection '2mg'$ /kg Intravenous at week 0, 4 and q 8 weeks   hydroxychloroquine (PLAQUENIL) 200 MG tablet Take 200 mg by mouth 2 (two) times daily.   KLOR-CON M20 20 MEQ tablet TAKE 1 TABLET BY MOUTH EVERY DAY   methotrexate (RHEUMATREX) 2.5 MG tablet Take 20 mg by mouth every Friday. In the morning.   rivaroxaban (XARELTO) 20 MG TABS tablet Take 1 tablet (20 mg total) by mouth daily with supper.   senna-docusate (SENOKOT S) 8.6-50 MG tablet Take 1 tablet by mouth at bedtime as needed.   tamsulosin (FLOMAX) 0.4 MG CAPS capsule Take 0.4 mg by mouth at bedtime.     Allergies:   Aquacel-ag surgical hydrofiber [wound dressings] and Tape   Social History   Socioeconomic History   Marital status: Divorced    Spouse name: Not on file   Number of children: 3   Years of education: Not on file   Highest education level: Not on file  Occupational History   Not on file  Tobacco Use   Smoking status: Every Day    Packs/day: 0.13    Years: 45.00    Total pack years: 5.85    Types: Cigarettes   Smokeless tobacco: Never   Tobacco comments:    "quit off and on"  Vaping Use   Vaping Use: Never used  Substance and Sexual Activity   Alcohol use: Yes    Comment: 07/22/2017 "nothing since 2016"   Drug use: No    Comment: 07/22/2017 "none since 11/2016" cocaine (sister is not aware)   Sexual activity: Not Currently  Other Topics Concern   Not on file  Social History Narrative   Lives in [place]    Works at a convenience store   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   Divorced with 3 children and 9 grandchildren, he states ex-wife is still a friend   Scientist, physiological Strain: Not on file  Food Insecurity: Not on file  Transportation Needs: Not on file  Physical Activity: Not on file  Stress: Not on file  Social Connections: Not  on file     Family History: The patient's  family history includes Cancer in his father and mother; Diabetes in his sister; Heart attack in his father; Heart disease in his father.  ROS:   Please see the history of present illness.    All other systems reviewed and are negative.  EKGs/Labs/Other Studies Reviewed Today:    TTE 04/2022  1. Left ventricular ejection fraction, by estimation, is 50 to 55%. The  left ventricle has low normal function. The left ventricle has no regional  wall motion abnormalities. Left ventricular diastolic parameters are  indeterminate.   2. Right ventricular systolic function is normal. The right ventricular  size is normal. Tricuspid regurgitation signal is inadequate for assessing  PA pressure.   3. The mitral valve is normal in structure. No evidence of mitral valve  regurgitation. No evidence of mitral stenosis.   4. The aortic valve is normal in structure. Aortic valve regurgitation is  not visualized. No aortic stenosis is present.   5. The inferior vena cava is normal in size with greater than 50%  respiratory variability, suggesting right atrial pressure of 3 mmHg.   EKG:  Last EKG results: AF, Rightward axis, low voltage   Recent Labs: 04/29/2022: ALT 27; BUN 11; Creatinine, Ser 0.78; Hemoglobin 13.9; Magnesium 2.1; NT-Pro BNP 136; Platelets 299; Potassium 5.0; Sodium 141; TSH 1.690     Physical Exam:    VS:  BP (!) 148/68   Pulse 94   Ht 5' 9.5" (1.765 m)   Wt 256 lb (116.1 kg)   SpO2 95%   BMI 37.26 kg/m     Wt Readings from Last 3 Encounters:  05/25/22 256 lb (116.1 kg)  04/29/22 253 lb 12.8 oz (115.1 kg)  01/08/22 237 lb (107.5 kg)     GEN:  Well nourished, well developed in no acute distress CARDIAC: Irregular rhythm with normal rate, no murmurs, rubs, gallops RESPIRATORY:  Normal work of breathing MUSCULOSKELETAL: trace edema    ASSESSMENT & PLAN:    Persistent atrial fibrillation: symptomatic with NYHA II-III symptoms. I recommended rhythm control. We  discussed management options including antiarrhythmic medication and ablation. We discussed the indication, rationale, logistics, anticipated benefits, and potential risks of the ablation procedure including but not limited to -- bleed at the groin access site, chest pain, damage to nearby organs such as the diaphragm, lungs, or esophagus, need for a drainage tube, or prolonged hospitalization. I explained that the risk for stroke, heart attack, need for open chest surgery, or even death is very low but not zero. he  expressed understanding and wishes to proceed with ablation. Prior to ablation, which won't occur until next year, we will arrange cardioversion since he is very symptomatic.  Atrial flutter: s/p ablation  History of CHFpEF: I think he will do much better in sinus rhythm.        Medication Adjustments/Labs and Tests Ordered: Current medicines are reviewed at length with the patient today.  Concerns regarding medicines are outlined above.  Orders Placed This Encounter  Procedures   CT CARDIAC MORPH/PULM VEIN W/CM&W/O CA SCORE   Basic metabolic panel   CBC with Differential/Platelet   Basic metabolic panel   CBC with Differential/Platelet   EKG 12-Lead   No orders of the defined types were placed in this encounter.    Signed, Melida Quitter, MD  05/25/2022 8:54 AM    [facility]

## 2022-05-25 ENCOUNTER — Encounter: Payer: Self-pay | Admitting: Cardiovascular Disease

## 2022-05-25 ENCOUNTER — Ambulatory Visit: Payer: Medicare Other | Attending: Cardiovascular Disease | Admitting: Cardiovascular Disease

## 2022-05-25 VITALS — BP 148/68 | HR 94 | Ht 69.5 in | Wt 256.0 lb

## 2022-05-25 DIAGNOSIS — I4892 Unspecified atrial flutter: Secondary | ICD-10-CM | POA: Diagnosis not present

## 2022-05-25 DIAGNOSIS — R9431 Abnormal electrocardiogram [ECG] [EKG]: Secondary | ICD-10-CM | POA: Diagnosis not present

## 2022-05-25 NOTE — Patient Instructions (Signed)
Medication Instructions:  Your physician recommends that you continue on your current medications as directed. Please refer to the Current Medication list given to you today.  *If you need a refill on your cardiac medications before your next appointment, please call your pharmacy*  Lab Work: BMET and CBC If you have labs (blood work) drawn today and your tests are completely normal, you will receive your results only by: [place] (if you have MyChart) OR A paper copy in the mail If you have any lab test that is abnormal or we need to change your treatment, we will call you to review the results.  Testing/Procedures: Your physician has recommended that you have a Cardioversion (DCCV). Electrical Cardioversion uses a jolt of electricity to your heart either through paddles or wired patches attached to your chest. This is a controlled, usually prescheduled, procedure. Defibrillation is done under light anesthesia in the hospital, and you usually go home the day of the procedure. This is done to get your heart back into a normal rhythm. You are not awake for the procedure. Please see the instruction sheet given to you today.  Your physician has requested that you have cardiac CT. Cardiac computed tomography (CT) is a painless test that uses an x-ray machine to take clear, detailed pictures of your heart. For further information please visit [facility]. Please follow instruction sheet as given.  Your physician has recommended that you have an ablation. Catheter ablation is a medical procedure used to treat some cardiac arrhythmias (irregular heartbeats). During catheter ablation, a long, thin, flexible tube is put into a blood vessel in your groin (upper thigh), or neck. This tube is called an ablation catheter. It is then guided to your heart through the blood vessel. Radio frequency waves destroy small areas of heart tissue where abnormal heartbeats may cause an arrhythmia to start.  Please see the instruction sheet given to you today.  Follow-Up: At [facility], you and your health needs are our priority.  As part of our continuing mission to provide you with exceptional heart care, we have created designated Provider Care Teams.  These Care Teams include your primary Cardiologist (physician) and Advanced Practice Providers (APPs -  Physician Assistants and Nurse Practitioners) who all work together to provide you with the care you need, when you need it.  Your next appointment:   See instruction letter  Important Information About [employer]

## 2022-06-09 ENCOUNTER — Ambulatory Visit (HOSPITAL_COMMUNITY)
Admission: RE | Admit: 2022-06-09 | Discharge: 2022-06-09 | Disposition: A | Discharge status: Home / Self Care | Payer: Medicare Other | Attending: Cardiology | Admitting: Cardiology

## 2022-06-09 ENCOUNTER — Ambulatory Visit (HOSPITAL_BASED_OUTPATIENT_CLINIC_OR_DEPARTMENT_OTHER): Payer: Medicare Other | Admitting: Certified Registered"

## 2022-06-09 ENCOUNTER — Encounter (HOSPITAL_COMMUNITY)
Admission: RE | Disposition: A | Discharge status: Home / Self Care | Payer: Self-pay | Source: Home / Self Care | Attending: Cardiology

## 2022-06-09 ENCOUNTER — Encounter (HOSPITAL_COMMUNITY): Payer: Self-pay | Admitting: Cardiology

## 2022-06-09 ENCOUNTER — Other Ambulatory Visit: Payer: Self-pay

## 2022-06-09 ENCOUNTER — Ambulatory Visit (HOSPITAL_COMMUNITY): Payer: Medicare Other | Admitting: Certified Registered"

## 2022-06-09 DIAGNOSIS — M069 Rheumatoid arthritis, unspecified: Secondary | ICD-10-CM | POA: Diagnosis not present

## 2022-06-09 DIAGNOSIS — E669 Obesity, unspecified: Secondary | ICD-10-CM | POA: Insufficient documentation

## 2022-06-09 DIAGNOSIS — F17200 Nicotine dependence, unspecified, uncomplicated: Secondary | ICD-10-CM | POA: Diagnosis not present

## 2022-06-09 DIAGNOSIS — I5032 Chronic diastolic (congestive) heart failure: Secondary | ICD-10-CM | POA: Diagnosis not present

## 2022-06-09 DIAGNOSIS — I509 Heart failure, unspecified: Secondary | ICD-10-CM

## 2022-06-09 DIAGNOSIS — I110 Hypertensive heart disease with heart failure: Secondary | ICD-10-CM

## 2022-06-09 DIAGNOSIS — Z6837 Body mass index (BMI) 37.0-37.9, adult: Secondary | ICD-10-CM | POA: Diagnosis not present

## 2022-06-09 DIAGNOSIS — F17210 Nicotine dependence, cigarettes, uncomplicated: Secondary | ICD-10-CM | POA: Diagnosis not present

## 2022-06-09 DIAGNOSIS — I4892 Unspecified atrial flutter: Secondary | ICD-10-CM | POA: Diagnosis not present

## 2022-06-09 DIAGNOSIS — I4819 Other persistent atrial fibrillation: Secondary | ICD-10-CM | POA: Diagnosis not present

## 2022-06-09 DIAGNOSIS — Z7901 Long term (current) use of anticoagulants: Secondary | ICD-10-CM | POA: Insufficient documentation

## 2022-06-09 DIAGNOSIS — Z01818 Encounter for other preprocedural examination: Secondary | ICD-10-CM

## 2022-06-09 DIAGNOSIS — I4891 Unspecified atrial fibrillation: Secondary | ICD-10-CM | POA: Diagnosis not present

## 2022-06-09 DIAGNOSIS — J189 Pneumonia, unspecified organism: Secondary | ICD-10-CM

## 2022-06-09 HISTORY — PX: CARDIOVERSION: SHX1299

## 2022-06-09 LAB — POCT I-STAT, CHEM 8
BUN: 17 mg/dL (ref 8–23)
Calcium, Ion: 1.09 mmol/L — ABNORMAL LOW (ref 1.15–1.40)
Chloride: 102 mmol/L (ref 98–111)
Creatinine, Ser: 0.8 mg/dL (ref 0.61–1.24)
Glucose, Bld: 116 mg/dL — ABNORMAL HIGH (ref 70–99)
HCT: 41 % (ref 39.0–52.0)
Hemoglobin: 13.9 g/dL (ref 13.0–17.0)
Potassium: 4.4 mmol/L (ref 3.5–5.1)
Sodium: 139 mmol/L (ref 135–145)
TCO2: 27 mmol/L (ref 22–32)

## 2022-06-09 SURGERY — CARDIOVERSION
Anesthesia: General

## 2022-06-09 MED ORDER — PROPOFOL 10 MG/ML IV BOLUS
INTRAVENOUS | Status: DC | PRN
  Administered 2022-06-09: 80 mg via INTRAVENOUS

## 2022-06-09 MED ORDER — SODIUM CHLORIDE 0.9 % IV SOLN: INTRAVENOUS | Status: DC

## 2022-06-09 MED ORDER — LIDOCAINE 2% (20 MG/ML) 5 ML SYRINGE
INTRAMUSCULAR | Status: DC | PRN
  Administered 2022-06-09: 40 mg via INTRAVENOUS

## 2022-06-09 NOTE — Discharge Instructions (Signed)

## 2022-06-09 NOTE — Anesthesia Postprocedure Evaluation (Signed)
Anesthesia Post Note  Patient: Brian [name]  Procedure(s) Performed: CARDIOVERSION     Patient location during evaluation: Endoscopy Anesthesia Type: General Level of consciousness: awake Pain management: pain level controlled Vital Signs Assessment: post-procedure vital signs reviewed and stable Respiratory status: spontaneous breathing Cardiovascular status: stable Postop Assessment: no apparent nausea or vomiting Anesthetic complications: no   No notable events documented.  Last Vitals:  Vitals:   06/09/22 0702 06/09/22 0800  BP: (!) 140/67 111/61  Pulse: 85 75  Resp: (!) 24 (!) 22  Temp: 36.7 C 36.6 C  SpO2: 96% 94%    Last Pain:  Vitals:   06/09/22 0800  TempSrc: Tympanic  PainSc: 0-No pain                 Amerah Puleo

## 2022-06-09 NOTE — Transfer of Care (Signed)
Immediate Anesthesia Transfer of Care Note  Patient: Brian [name]  Procedure(s) Performed: CARDIOVERSION  Patient Location: Endoscopy Unit  Anesthesia Type:General  Level of Consciousness: drowsy  Airway & Oxygen Therapy: Patient Spontanous Breathing  Post-op Assessment: Report given to RN  Post vital signs: Reviewed and stable  Last Vitals:  Vitals Value Taken Time  BP 111/61   Temp    Pulse 70   Resp 22   SpO2 93     Last Pain:  Vitals:   06/09/22 0702  TempSrc: Temporal  PainSc: 6          Complications: No notable events documented.

## 2022-06-09 NOTE — Interval H&P Note (Signed)
History and Physical Interval Note:  06/09/2022 7:38 AM  Brian [name]  has presented today for surgery, with the diagnosis of AFIB.  The various methods of treatment have been discussed with the patient and family. After consideration of risks, benefits and other options for treatment, the patient has consented to  Procedure(s): CARDIOVERSION (N/A) as a surgical intervention.  The patient's history has been reviewed, patient examined, no change in status, stable for surgery.  I have reviewed the patient's chart and labs.  Questions were answered to the patient's satisfaction.     [employer]

## 2022-06-09 NOTE — CV Procedure (Addendum)
    Electrical Cardioversion Procedure Note YI HAUGAN [id number] 11/15/1950  Procedure: Electrical Cardioversion Indications:  Atrial Fibrillation  Time Out: Verified patient identification, verified procedure,medications/allergies/relevent history reviewed, required imaging and test results available.  Performed  Procedure Details  The patient was NPO after midnight. Anesthesia was administered at the beside  by Dr. Nyoka Cowden with '80mg'$  of propofol.  Cardioversion was performed with synchronized biphasic defibrillation via AP pads with 200 joules.  1 attempt(s) were performed.  The patient converted to normal sinus rhythm. The patient tolerated the procedure well   IMPRESSION:  Successful cardioversion of atrial fibrillation    Candee Furbish 06/09/2022, 7:58 AM   ADDENDUM 8:20am  Went back to AFIB. Patient was emotional about this. Encouraged him that it is better than never having cardioversion take place. Awaiting ablation.   Candee Furbish, MD

## 2022-06-09 NOTE — Progress Notes (Signed)
Pt was very emotional after waking up from anesthesia and realizing that he was still in atrial fibrillation. He said that he felt helpless and that he wished that he didn't have these problems anymore and that sometimes it would be easier if he wasn't here. Dr. Marlou Porch made aware and spoke in length with the pt after he was alert and oriented. Per Dr. Marlou Porch he does not feel like the pt is a harm to himself or anyone else. After pt was awake, alert and oriented, he denies wanting to hurt himself or ever trying to hurt himself. Pt eager to leave to get breakfast. Jobe Igo, RN

## 2022-06-09 NOTE — Anesthesia Preprocedure Evaluation (Signed)
Anesthesia Evaluation  Patient identified by MRN, date of birth, ID band Patient awake  General Assessment Comment:History noted Dr. Nyoka Cowden  Reviewed: Allergy & Precautions, NPO status , Patient's Chart, lab work & pertinent test results  Airway Mallampati: II       Dental   Pulmonary shortness of breath, pneumonia, Current Smoker and Patient abstained from smoking.   breath sounds clear to auscultation       Cardiovascular hypertension, +CHF   Rhythm:Regular Rate:Normal     Neuro/Psych  PSYCHIATRIC DISORDERS      TIA   GI/Hepatic Neg liver ROS,GERD  ,,  Endo/Other    Renal/GU negative Renal ROS     Musculoskeletal  (+) Arthritis ,    Abdominal   Peds  Hematology   Anesthesia Other Findings   Reproductive/Obstetrics                             Anesthesia Physical Anesthesia Plan  ASA: 3  Anesthesia Plan: General   Post-op Pain Management:    Induction:   PONV Risk Score and Plan: Propofol infusion  Airway Management Planned: Nasal Cannula and Simple Face Mask  Additional Equipment:   Intra-op Plan:   Post-operative Plan:   Informed Consent: I have reviewed the patients History and Physical, chart, labs and discussed the procedure including the risks, benefits and alternatives for the proposed anesthesia with the patient or authorized representative who has indicated his/her understanding and acceptance.     Dental advisory given  Plan Discussed with: CRNA and Anesthesiologist  Anesthesia Plan Comments:        Anesthesia Quick Evaluation

## 2022-06-14 ENCOUNTER — Encounter (HOSPITAL_COMMUNITY): Payer: Self-pay | Admitting: Cardiology

## 2022-06-15 DIAGNOSIS — Z79899 Other long term (current) drug therapy: Secondary | ICD-10-CM | POA: Diagnosis not present

## 2022-06-15 DIAGNOSIS — M0579 Rheumatoid arthritis with rheumatoid factor of multiple sites without organ or systems involvement: Secondary | ICD-10-CM | POA: Diagnosis not present

## 2022-07-06 ENCOUNTER — Ambulatory Visit: Payer: Medicare Other | Attending: Cardiology

## 2022-07-06 DIAGNOSIS — I4892 Unspecified atrial flutter: Secondary | ICD-10-CM | POA: Diagnosis not present

## 2022-07-06 LAB — CBC WITH DIFFERENTIAL/PLATELET

## 2022-07-07 LAB — CBC WITH DIFFERENTIAL/PLATELET
Basophils Absolute: 0.1 10*3/uL (ref 0.0–0.2)
Basos: 1 %
EOS (ABSOLUTE): 0.3 10*3/uL (ref 0.0–0.4)
Eos: 3 %
Hematocrit: 40.5 % (ref 37.5–51.0)
Hemoglobin: 12.4 g/dL — ABNORMAL LOW (ref 13.0–17.7)
Immature Grans (Abs): 0.1 10*3/uL (ref 0.0–0.1)
Immature Granulocytes: 1 %
Lymphocytes Absolute: 2.1 10*3/uL (ref 0.7–3.1)
Lymphs: 19 %
MCH: 26.6 pg (ref 26.6–33.0)
MCHC: 30.6 g/dL — ABNORMAL LOW (ref 31.5–35.7)
MCV: 87 fL (ref 79–97)
Monocytes Absolute: 1.3 10*3/uL — ABNORMAL HIGH (ref 0.1–0.9)
Monocytes: 12 %
Neutrophils Absolute: 7.3 10*3/uL — ABNORMAL HIGH (ref 1.4–7.0)
Neutrophils: 64 %
Platelets: 376 10*3/uL (ref 150–450)
RBC: 4.67 x10E6/uL (ref 4.14–5.80)
RDW: 15.4 % (ref 11.6–15.4)
WBC: 11.3 10*3/uL — ABNORMAL HIGH (ref 3.4–10.8)

## 2022-07-07 LAB — BASIC METABOLIC PANEL
BUN/Creatinine Ratio: 19 (ref 10–24)
BUN: 15 mg/dL (ref 8–27)
CO2: 23 mmol/L (ref 20–29)
Calcium: 9.1 mg/dL (ref 8.6–10.2)
Chloride: 97 mmol/L (ref 96–106)
Creatinine, Ser: 0.8 mg/dL (ref 0.76–1.27)
Glucose: 111 mg/dL — ABNORMAL HIGH (ref 70–99)
Potassium: 4.5 mmol/L (ref 3.5–5.2)
Sodium: 138 mmol/L (ref 134–144)
eGFR: 95 mL/min/{1.73_m2} (ref >59)

## 2022-07-13 ENCOUNTER — Telehealth (HOSPITAL_COMMUNITY): Payer: Self-pay | Admitting: *Deleted

## 2022-07-13 NOTE — Telephone Encounter (Signed)
Attempted to call patient regarding upcoming cardiac CT appointment. °Left message on voicemail with name and callback number ° °Latica Hohmann RN Navigator Cardiac Imaging ° Heart and Vascular Services °[phone] Office °[phone] Cell ° °

## 2022-07-14 ENCOUNTER — Ambulatory Visit (HOSPITAL_COMMUNITY)
Admission: RE | Admit: 2022-07-14 | Discharge: 2022-07-14 | Disposition: A | Discharge status: Home / Self Care | Payer: Medicare Other | Source: Ambulatory Visit | Attending: Cardiovascular Disease | Admitting: Cardiovascular Disease

## 2022-07-14 DIAGNOSIS — R9431 Abnormal electrocardiogram [ECG] [EKG]: Secondary | ICD-10-CM | POA: Diagnosis not present

## 2022-07-14 DIAGNOSIS — I4892 Unspecified atrial flutter: Secondary | ICD-10-CM | POA: Diagnosis not present

## 2022-07-14 MED ORDER — IOHEXOL 350 MG/ML SOLN
95.0000 mL | Freq: Once | INTRAVENOUS | Status: AC | PRN
  Administered 2022-07-14: 95 mL via INTRAVENOUS

## 2022-07-17 NOTE — Pre-Procedure Instructions (Signed)
Attempted to call patient regarding procedure instructions.  Left voicemail on the following items: Arrival time 0515 Nothing to eat or drink after midnight No meds AM of procedure Responsible person to drive you home and stay with you for 24 hrs  Have you missed any doses of anti-coagulant Xarelto- if you have missed any doses please let the office know.

## 2022-07-20 ENCOUNTER — Other Ambulatory Visit (HOSPITAL_COMMUNITY): Payer: Self-pay

## 2022-07-20 ENCOUNTER — Ambulatory Visit (HOSPITAL_COMMUNITY)
Admission: RE | Disposition: A | Discharge status: Home / Self Care | Payer: Self-pay | Source: Home / Self Care | Attending: Cardiovascular Disease

## 2022-07-20 ENCOUNTER — Ambulatory Visit (HOSPITAL_COMMUNITY): Payer: Medicare Other | Admitting: Anesthesiology

## 2022-07-20 ENCOUNTER — Ambulatory Visit (HOSPITAL_BASED_OUTPATIENT_CLINIC_OR_DEPARTMENT_OTHER): Payer: Medicare Other | Admitting: Anesthesiology

## 2022-07-20 ENCOUNTER — Other Ambulatory Visit: Payer: Self-pay

## 2022-07-20 ENCOUNTER — Ambulatory Visit (HOSPITAL_COMMUNITY)
Admission: RE | Admit: 2022-07-20 | Discharge: 2022-07-20 | Disposition: A | Discharge status: Home / Self Care | Payer: Medicare Other | Attending: Cardiovascular Disease | Admitting: Cardiovascular Disease

## 2022-07-20 DIAGNOSIS — F17210 Nicotine dependence, cigarettes, uncomplicated: Secondary | ICD-10-CM

## 2022-07-20 DIAGNOSIS — I4819 Other persistent atrial fibrillation: Secondary | ICD-10-CM | POA: Diagnosis not present

## 2022-07-20 DIAGNOSIS — I10 Essential (primary) hypertension: Secondary | ICD-10-CM | POA: Diagnosis not present

## 2022-07-20 DIAGNOSIS — I5032 Chronic diastolic (congestive) heart failure: Secondary | ICD-10-CM | POA: Diagnosis not present

## 2022-07-20 DIAGNOSIS — I4891 Unspecified atrial fibrillation: Secondary | ICD-10-CM

## 2022-07-20 DIAGNOSIS — I4892 Unspecified atrial flutter: Secondary | ICD-10-CM | POA: Diagnosis not present

## 2022-07-20 DIAGNOSIS — F17200 Nicotine dependence, unspecified, uncomplicated: Secondary | ICD-10-CM | POA: Diagnosis not present

## 2022-07-20 DIAGNOSIS — I110 Hypertensive heart disease with heart failure: Secondary | ICD-10-CM | POA: Insufficient documentation

## 2022-07-20 HISTORY — PX: ATRIAL FIBRILLATION ABLATION: EP1191

## 2022-07-20 LAB — POCT ACTIVATED CLOTTING TIME
Activated Clotting Time: 314 seconds
Activated Clotting Time: 336 seconds
Activated Clotting Time: 282 seconds

## 2022-07-20 SURGERY — ATRIAL FIBRILLATION ABLATION
Anesthesia: General

## 2022-07-20 MED ORDER — ROCURONIUM BROMIDE 10 MG/ML (PF) SYRINGE
PREFILLED_SYRINGE | INTRAVENOUS | Status: DC | PRN
  Administered 2022-07-20: 20 mg via INTRAVENOUS
  Administered 2022-07-20: 50 mg via INTRAVENOUS
  Administered 2022-07-20: 20 mg via INTRAVENOUS

## 2022-07-20 MED ORDER — AMIODARONE HCL IN DEXTROSE 360-4.14 MG/200ML-% IV SOLN
INTRAVENOUS | Status: AC
  Filled 2022-07-20: qty 200

## 2022-07-20 MED ORDER — PANTOPRAZOLE SODIUM 40 MG PO TBEC
40.0000 mg | DELAYED_RELEASE_TABLET | Freq: Every day | ORAL | 0 refills | Status: DC
  Filled 2022-07-20: qty 30, 30d supply, fill #0

## 2022-07-20 MED ORDER — HEPARIN (PORCINE) IN NACL 1000-0.9 UT/500ML-% IV SOLN
INTRAVENOUS | Status: AC
  Filled 2022-07-20: qty 2000

## 2022-07-20 MED ORDER — ONDANSETRON HCL 4 MG/2ML IJ SOLN
INTRAMUSCULAR | Status: DC | PRN
  Administered 2022-07-20: 4 mg via INTRAVENOUS

## 2022-07-20 MED ORDER — AMIODARONE IV BOLUS ONLY 150 MG/100ML
INTRAVENOUS | Status: DC | PRN
  Administered 2022-07-20: 150 mg via INTRAVENOUS

## 2022-07-20 MED ORDER — AMIODARONE HCL 200 MG PO TABS: 200.0000 mg | ORAL_TABLET | Freq: Every day | ORAL | 3 refills | Status: DC

## 2022-07-20 MED ORDER — PHENYLEPHRINE 80 MCG/ML (10ML) SYRINGE FOR IV PUSH (FOR BLOOD PRESSURE SUPPORT)
PREFILLED_SYRINGE | INTRAVENOUS | Status: DC | PRN
  Administered 2022-07-20 (×3): 160 ug via INTRAVENOUS
  Administered 2022-07-20: 80 ug via INTRAVENOUS
  Administered 2022-07-20 (×2): 160 ug via INTRAVENOUS
  Administered 2022-07-20: 80 ug via INTRAVENOUS
  Administered 2022-07-20: 160 ug via INTRAVENOUS

## 2022-07-20 MED ORDER — AMIODARONE HCL 200 MG PO TABS
200.0000 mg | ORAL_TABLET | Freq: Two times a day (BID) | ORAL | 0 refills | Status: DC
  Filled 2022-07-20: qty 28, 14d supply, fill #0

## 2022-07-20 MED ORDER — SODIUM CHLORIDE 0.9 % IV SOLN: INTRAVENOUS | Status: DC

## 2022-07-20 MED ORDER — SODIUM CHLORIDE 0.9% FLUSH: 3.0000 mL | INTRAVENOUS | Status: DC | PRN

## 2022-07-20 MED ORDER — PROPOFOL 10 MG/ML IV BOLUS
INTRAVENOUS | Status: DC | PRN
  Administered 2022-07-20: 130 mg via INTRAVENOUS

## 2022-07-20 MED ORDER — HEPARIN SODIUM (PORCINE) 1000 UNIT/ML IJ SOLN
INTRAMUSCULAR | Status: DC | PRN
  Administered 2022-07-20: 1000 [IU] via INTRAVENOUS

## 2022-07-20 MED ORDER — HEPARIN (PORCINE) IN NACL 1000-0.9 UT/500ML-% IV SOLN
INTRAVENOUS | Status: DC | PRN
  Administered 2022-07-20 (×4): 500 mL

## 2022-07-20 MED ORDER — ONDANSETRON HCL 4 MG/2ML IJ SOLN: 4.0000 mg | Freq: Four times a day (QID) | INTRAMUSCULAR | Status: DC | PRN

## 2022-07-20 MED ORDER — COLCHICINE 0.6 MG PO TABS
0.3000 mg | ORAL_TABLET | Freq: Two times a day (BID) | ORAL | 0 refills | Status: DC
  Filled 2022-07-20: qty 5, 5d supply, fill #0

## 2022-07-20 MED ORDER — SUGAMMADEX SODIUM 200 MG/2ML IV SOLN
INTRAVENOUS | Status: DC | PRN
  Administered 2022-07-20: 200 mg via INTRAVENOUS

## 2022-07-20 MED ORDER — DOBUTAMINE INFUSION FOR EP/ECHO/NUC (1000 MCG/ML)
INTRAVENOUS | Status: AC
  Filled 2022-07-20: qty 250

## 2022-07-20 MED ORDER — PROTAMINE SULFATE 10 MG/ML IV SOLN
INTRAVENOUS | Status: DC | PRN
  Administered 2022-07-20: 50 mg via INTRAVENOUS

## 2022-07-20 MED ORDER — FENTANYL CITRATE (PF) 250 MCG/5ML IJ SOLN
INTRAMUSCULAR | Status: DC | PRN
  Administered 2022-07-20: 50 ug via INTRAVENOUS

## 2022-07-20 MED ORDER — HEPARIN SODIUM (PORCINE) 1000 UNIT/ML IJ SOLN
INTRAMUSCULAR | Status: DC | PRN
  Administered 2022-07-20: 2000 [IU] via INTRAVENOUS
  Administered 2022-07-20: 18000 [IU] via INTRAVENOUS

## 2022-07-20 MED ORDER — SODIUM CHLORIDE 0.9 % IV SOLN: 250.0000 mL | INTRAVENOUS | Status: DC | PRN

## 2022-07-20 MED ORDER — ACETAMINOPHEN 325 MG PO TABS: 650.0000 mg | ORAL_TABLET | ORAL | Status: DC | PRN

## 2022-07-20 MED ORDER — PHENYLEPHRINE HCL-NACL 20-0.9 MG/250ML-% IV SOLN
INTRAVENOUS | Status: DC | PRN
  Administered 2022-07-20: 40 ug/min via INTRAVENOUS

## 2022-07-20 MED ORDER — DEXAMETHASONE SODIUM PHOSPHATE 10 MG/ML IJ SOLN
INTRAMUSCULAR | Status: DC | PRN
  Administered 2022-07-20: 5 mg via INTRAVENOUS

## 2022-07-20 MED ORDER — HEPARIN SODIUM (PORCINE) 1000 UNIT/ML IJ SOLN
INTRAMUSCULAR | Status: AC
  Filled 2022-07-20: qty 10

## 2022-07-20 MED ORDER — AMIODARONE HCL 150 MG/3ML IV SOLN
INTRAVENOUS | Status: AC
  Filled 2022-07-20: qty 3

## 2022-07-20 MED ORDER — LIDOCAINE 2% (20 MG/ML) 5 ML SYRINGE
INTRAMUSCULAR | Status: DC | PRN
  Administered 2022-07-20: 60 mg via INTRAVENOUS

## 2022-07-20 SURGICAL SUPPLY — 21 items
BAG SNAP BAND KOVER 36X36 (MISCELLANEOUS) IMPLANT
CATH 8FR REPROCESSED SOUNDSTAR (CATHETERS) ×1 IMPLANT
CATH 8FR SOUNDSTAR REPROCESSED (CATHETERS) IMPLANT
CATH ABLAT QDOT MICRO BI TC FJ (CATHETERS) IMPLANT
CATH MAPPNG PENTARAY F 2-6-2MM (CATHETERS) IMPLANT
CATH PIGTAIL STEERABLE D1 8.7 (WIRE) IMPLANT
CATH S-M CIRCA TEMP PROBE (CATHETERS) IMPLANT
CATH WEB BI DIR CSDF CRV REPRO (CATHETERS) IMPLANT
CLOSURE PERCLOSE PROSTYLE (VASCULAR PRODUCTS) IMPLANT
COVER SWIFTLINK CONNECTOR (BAG) ×1 IMPLANT
DEVICE CLOSURE MYNXGRIP 6/7F (Vascular Products) IMPLANT
PACK EP LATEX FREE (CUSTOM PROCEDURE TRAY) ×1
PACK EP LF (CUSTOM PROCEDURE TRAY) ×1 IMPLANT
PAD DEFIB RADIO PHYSIO CONN (PAD) ×1 IMPLANT
PATCH CARTO3 (PAD) IMPLANT
PENTARAY F 2-6-2MM (CATHETERS) ×1
SHEATH CARTO VIZIGO SM CVD (SHEATH) IMPLANT
SHEATH PINNACLE 8F 10CM (SHEATH) IMPLANT
SHEATH PINNACLE 9F 10CM (SHEATH) IMPLANT
SHEATH PROBE COVER 6X72 (BAG) IMPLANT
TUBING SMART ABLATE COOLFLOW (TUBING) IMPLANT

## 2022-07-20 NOTE — Anesthesia Preprocedure Evaluation (Addendum)
Anesthesia Evaluation  Patient identified by MRN, date of birth, ID band Patient awake    Reviewed: Allergy & Precautions, H&P , NPO status , Patient's Chart, lab work & pertinent test results  Airway Mallampati: II  TM Distance: <3 FB Neck ROM: Full    Dental  (+) Upper Dentures, Lower Dentures   Pulmonary shortness of breath, Current Smoker   Pulmonary exam normal breath sounds clear to auscultation       Cardiovascular hypertension, Normal cardiovascular exam+ dysrhythmias Atrial Fibrillation  Rhythm:Irregular Rate:Normal  1. Left ventricular ejection fraction, by estimation, is 50 to 55%. The  left ventricle has low normal function. The left ventricle has no regional  wall motion abnormalities. Left ventricular diastolic parameters are  indeterminate.   2. Right ventricular systolic function is normal. The right ventricular  size is normal. Tricuspid regurgitation signal is inadequate for assessing  PA pressure.   3. The mitral valve is normal in structure. No evidence of mitral valve  regurgitation. No evidence of mitral stenosis.   4. The aortic valve is normal in structure. Aortic valve regurgitation is  not visualized. No aortic stenosis is present.   5. The inferior vena cava is normal in size with greater than 50%  respiratory variability, suggesting right atrial pressure of 3 mmHg.      Neuro/Psych negative neurological ROS  negative psych ROS   GI/Hepatic negative GI ROS, Neg liver ROS,,,  Endo/Other  negative endocrine ROS    Renal/GU negative Renal ROS  negative genitourinary   Musculoskeletal negative musculoskeletal ROS (+)    Abdominal   Peds negative pediatric ROS (+)  Hematology negative hematology ROS (+)   Anesthesia Other Findings   Reproductive/Obstetrics negative OB ROS                             Anesthesia Physical Anesthesia Plan  ASA: 3  Anesthesia  Plan: General   Post-op Pain Management: Minimal or no pain anticipated   Induction: Intravenous  PONV Risk Score and Plan: 1 and Ondansetron, Dexamethasone and Treatment may vary due to age or medical condition  Airway Management Planned: Oral ETT  Additional Equipment:   Intra-op Plan:   Post-operative Plan: Extubation in OR  Informed Consent: I have reviewed the patients History and Physical, chart, labs and discussed the procedure including the risks, benefits and alternatives for the proposed anesthesia with the patient or authorized representative who has indicated his/her understanding and acceptance.     Dental advisory given  Plan Discussed with: CRNA and Surgeon  Anesthesia Plan Comments:        Anesthesia Quick Evaluation

## 2022-07-20 NOTE — Progress Notes (Signed)
Patient and daughter was given discharge instructions. Both verbalized understanding. 

## 2022-07-20 NOTE — Transfer of Care (Signed)
Immediate Anesthesia Transfer of Care Note  Patient: Brian [name]  Procedure(s) Performed: ATRIAL FIBRILLATION ABLATION  Patient Location: Cath Lab  Anesthesia Type:General  Level of Consciousness: awake  Airway & Oxygen Therapy: Patient Spontanous Breathing and Patient connected to nasal cannula oxygen  Post-op Assessment: Report given to RN, Post -op Vital signs reviewed and stable, and Patient moving all extremities X 4  Post vital signs: Reviewed and stable  Last Vitals:  Vitals Value Taken Time  BP 142/72 07/20/22 1041  Temp 36.9 C 07/20/22 1039  Pulse 90 07/20/22 1041  Resp 22 07/20/22 1041  SpO2 95 % 07/20/22 1041  Vitals shown include unvalidated device data.  Last Pain:  Vitals:   07/20/22 1039  TempSrc: Temporal  PainSc: Asleep         Complications: No notable events documented.

## 2022-07-20 NOTE — Anesthesia Procedure Notes (Signed)
Procedure Name: Intubation Date/Time: 07/20/2022 7:47 AM  Performed by: Heide Scales, CRNAPre-anesthesia Checklist: Patient identified, Emergency Drugs available, Suction available and Patient being monitored Patient Re-evaluated:Patient Re-evaluated prior to induction Oxygen Delivery Method: Circle system utilized Preoxygenation: Pre-oxygenation with 100% oxygen Induction Type: IV induction Ventilation: Mask ventilation without difficulty and Oral airway inserted - appropriate to patient size Laryngoscope Size: Mac and 4 Grade View: Grade I Tube type: Oral Tube size: 7.5 mm Number of attempts: 1 Airway Equipment and Method: Stylet and Oral airway Placement Confirmation: ETT inserted through vocal cords under direct vision, positive ETCO2 and breath sounds checked- equal and bilateral Secured at: 22 cm Tube secured with: Tape Dental Injury: Teeth and Oropharynx as per pre-operative assessment

## 2022-07-20 NOTE — H&P (Signed)
Electrophysiology Office Note:    Date:  07/20/2022   ID:  Brian [name], DOB 1950-09-04, MRN [id number]  PCP:  Orpah Melter, MD   [facility] Cardiologist:  Candee Furbish, MD Electrophysiologist:  Melida Quitter, MD     Referring MD: No ref. provider found   Chief complaint: tired, can't do anything     History of Present Illness:    Brian [name] is a 72 y.o. male with a hx of PAF, atrial flutter, nephrolithiasis, HTN, cocaine use, tobacco use, HF with recovered EF referred for rhythm management.  He developed CHFpEF in the setting of atrial flutter that resolved with flutter ablation in 2018. He developed atrial fibrillation related to sepsis. He appears to have remained in sinus rhythm until follow-up in July, 2023. Cardioversion was deferred due to interruption of anticoagulation for serial urologic procedures regarding his bladder cancer.  He has been reluctant to take anticoagulation in the past.  I reviewed the patient's CT and labs. There was no LAA thrombus. he  has not missed any doses of anticoagulation, and he took his dose last night. There have been no changes in the patient's diagnoses, medications, or condition since our recent clinic visit.                                                                                                                                                      Past Medical History:  Diagnosis Date   Anemia    none since 2019   Anxiety    Atrial flutter ([place])    a. 08/2012   CHF (congestive heart failure) (HCC)    chronic diastolic chf, pt denies   GERD (gastroesophageal reflux disease)    Gout    pt denies   History of kidney stones    passed   Hypertension    Obesity    Paroxysmal atrial fibrillation ([place])    Pneumonia 09/2016   Rheumatoid arthritis ([place])    "a little bit qwhere" (07/22/2017)   Shortness of breath    with heavy  exertion   Tobacco abuse     Past Surgical History:  Procedure  Laterality Date   A-FLUTTER ABLATION N/A 12/29/2016   Procedure: A-Flutter Ablation;  Surgeon: [name], MD;  Location: [place] [facility];  Service: Cardiovascular;  Laterality: N/A;   CARDIOVERSION N/A 11/05/2016   Procedure: CARDIOVERSION;  Surgeon: Sueanne Margarita, MD;  Location: [facility] ENDOSCOPY;  Service: Cardiovascular;  Laterality: N/A;   CARDIOVERSION N/A 06/09/2022   Procedure: CARDIOVERSION;  Surgeon: Jerline Pain, MD;  Location: [place] [facility];  Service: Cardiovascular;  Laterality: N/A;   IR THORACENTESIS ASP PLEURAL SPACE W/IMG GUIDE  10/01/2016   JOINT REPLACEMENT     Right hip and left knee   MICROLARYNGOSCOPY  09/02/2007   with excision  of right vocal cord mass, Dr. Constance Holster   PROSTATE BIOPSY N/A 02/22/2020   Procedure: BIOPSY TRANSRECTAL ULTRASONIC PROSTATE (TUBP);  Surgeon: Irine Seal, MD;  Location: [facility];  Service: Urology;  Laterality: N/A;   TOTAL HIP ARTHROPLASTY Right 11/17/2017   Procedure: RIGHT TOTAL HIP ARTHROPLASTY ANTERIOR APPROACH;  Surgeon: Leandrew Koyanagi, MD;  Location: [place];  Service: Orthopedics;  Laterality: Right;   TOTAL HIP ARTHROPLASTY Left 03/07/2018   Procedure: LEFT TOTAL HIP ARTHROPLASTY ANTERIOR APPROACH;  Surgeon: Leandrew Koyanagi, MD;  Location: [place];  Service: Orthopedics;  Laterality: Left;   TOTAL KNEE ARTHROPLASTY Left 07/22/2017   Procedure: LEFT TOTAL KNEE ARTHROPLASTY;  Surgeon: Leandrew Koyanagi, MD;  Location: [place];  Service: Orthopedics;  Laterality: Left;   TRANSURETHRAL RESECTION OF BLADDER TUMOR N/A 04/02/2020   Procedure: RESTAGING TRANSURETHRAL RESECTION OF BLADDER TUMOR (TURBT);  Surgeon: Irine Seal, MD;  Location: [facility];  Service: Urology;  Laterality: N/A;  GENERAL ANESTHESIA WIHT PARALYSIS   TRANSURETHRAL RESECTION OF BLADDER TUMOR WITH MITOMYCIN-C N/A 02/22/2020   Procedure: TRANSURETHRAL RESECTION OF BLADDER TUMOR WITH   GEMCITABINE;  Surgeon: Irine Seal, MD;  Location: [facility];   Service: Urology;  Laterality: N/A;    Current Medications: Current Meds  Medication Sig   cephALEXin (KEFLEX) 250 MG capsule Take 250 mg by mouth at bedtime.   cyanocobalamin (VITAMIN B12) 1000 MCG tablet Take 1,000 mcg by mouth daily.   diltiazem (CARDIZEM CD) 120 MG 24 hr capsule Take 1 capsule (120 mg total) by mouth daily.   Docusate Calcium (STOOL SOFTENER PO) Take 1 tablet by mouth daily.   folic acid (FOLVITE) 1 MG tablet Take 1 mg by mouth daily.   furosemide (LASIX) 40 MG tablet Take 1 tablet (40 mg total) by mouth daily.   golimumab (SIMPONI ARIA) 50 MG/4ML SOLN injection Inject 50 mg into the vein every 8 (eight) weeks.   hydroxychloroquine (PLAQUENIL) 200 MG tablet Take 200 mg by mouth 2 (two) times daily.   KLOR-CON M20 20 MEQ tablet TAKE 1 TABLET BY MOUTH EVERY DAY (Patient taking differently: Take 20 mEq by mouth every other day.)   methotrexate (RHEUMATREX) 2.5 MG tablet Take 25 mg by mouth every Friday. 10 tablets 1 time a week on Fridays in the evening   rivaroxaban (XARELTO) 20 MG TABS tablet Take 1 tablet (20 mg total) by mouth daily with supper.   tamsulosin (FLOMAX) 0.4 MG CAPS capsule Take 0.4 mg by mouth at bedtime.     Allergies:   Aquacel-ag surgical hydrofiber [wound dressings] and Tape   Social History   Socioeconomic History   Marital status: Divorced    Spouse name: Not on file   Number of children: 3   Years of education: Not on file   Highest education level: Not on file  Occupational History   Not on file  Tobacco Use   Smoking status: Every Day    Packs/day: 0.13    Years: 45.00    Total pack years: 5.85    Types: Cigarettes   Smokeless tobacco: Never   Tobacco comments:    "quit off and on"  Vaping Use   Vaping Use: Never used  Substance and Sexual Activity   Alcohol use: Yes    Comment: 07/22/2017 "nothing since 2016"   Drug use: No    Comment: 07/22/2017 "none since 11/2016" cocaine (sister is not aware)   Sexual activity: Not  Currently  Other Topics  Concern   Not on file  Social History Narrative   Lives in [place]    Works at a convenience store   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   Divorced with 3 children and 9 grandchildren, he states ex-wife is still a friend   Investment banker, operational of Sales executive: Not on file  Food Insecurity: Not on file  Transportation Needs: Not on file  Physical Activity: Not on file  Stress: Not on file  Social Connections: Not on file     Family History: The patient's family history includes Cancer in his father and mother; Diabetes in his sister; Heart attack in his father; Heart disease in his father.  ROS:   Please see the history of present illness.    All other systems reviewed and are negative.  EKGs/Labs/Other Studies Reviewed Today:    TTE 04/2022  1. Left ventricular ejection fraction, by estimation, is 50 to 55%. The  left ventricle has low normal function. The left ventricle has no regional  wall motion abnormalities. Left ventricular diastolic parameters are  indeterminate.   2. Right ventricular systolic function is normal. The right ventricular  size is normal. Tricuspid regurgitation signal is inadequate for assessing  PA pressure.   3. The mitral valve is normal in structure. No evidence of mitral valve  regurgitation. No evidence of mitral stenosis.   4. The aortic valve is normal in structure. Aortic valve regurgitation is  not visualized. No aortic stenosis is present.   5. The inferior vena cava is normal in size with greater than 50%  respiratory variability, suggesting right atrial pressure of 3 mmHg.   EKG:  Last EKG results: AF, Rightward axis, low voltage   Recent Labs: 04/29/2022: ALT 27; Magnesium 2.1; NT-Pro BNP 136; TSH 1.690 07/06/2022: BUN 15; Creatinine, Ser 0.80; Hemoglobin 12.4; Platelets 376; Potassium 4.5; Sodium 138     Physical Exam:    VS:  BP (!) 132/58   Pulse 68   Temp (!) 97.3 F (36.3 C)  (Temporal)   Resp 16   Ht '5\' 10"'$  (1.778 m)   Wt 117.5 kg   SpO2 96%   BMI 37.16 kg/m     Wt Readings from Last 3 Encounters:  07/20/22 117.5 kg  06/09/22 117 kg  05/25/22 116.1 kg     GEN:  Well nourished, well developed in no acute distress CARDIAC: Irregular rhythm with normal rate, no murmurs, rubs, gallops RESPIRATORY:  Normal work of breathing MUSCULOSKELETAL: trace edema    ASSESSMENT & PLAN:    Persistent atrial fibrillation: symptomatic with NYHA II-III symptoms. Failed cardioversion recently. We will proceed with ablation today.  Atrial flutter: s/p ablation  History of CHFpEF: I think he will do much better in sinus rhythm.          Signed, Melida Quitter, MD  07/20/2022 7:11 AM    [place]

## 2022-07-20 NOTE — Discharge Instructions (Addendum)
Cardiac Ablation, Care After  This sheet gives you information about how to care for yourself after your procedure. Your health care provider may also give you more specific instructions. If you have problems or questions, contact your health care provider. What can I expect after the procedure? After the procedure, it is common to have: Bruising around your puncture site. Tenderness around your puncture site. Skipped heartbeats. If you had an atrial fibrillation ablation, you may have atrial fibrillation during the first several months after your procedure.  Tiredness (fatigue).  Follow these instructions at home: Puncture site care  Follow instructions from your health care provider about how to take care of your puncture site. Make sure you: If present, leave stitches (sutures), skin glue, or adhesive strips in place. These skin closures may need to stay in place for up to 2 weeks. If adhesive strip edges start to loosen and curl up, you may trim the loose edges. Do not remove adhesive strips completely unless your health care provider tells you to do that. If a large square bandage is present, this may be removed 24 hours after surgery.  Check your puncture site every day for signs of infection. Check for: Redness, swelling, or pain. Fluid or blood. If your puncture site starts to bleed, lie down on your back, apply firm pressure to the area, and contact your health care provider. Warmth. Pus or a bad smell. A pea or small marble sized lump at the site is normal and can take up to three months to resolve.  Driving Do not drive for at least 4 days after your procedure or however long your health care provider recommends. (Do not resume driving if you have previously been instructed not to drive for other health reasons.) Do not drive or use heavy machinery while taking prescription pain medicine. Activity Avoid activities that take a lot of effort for at least 7 days after your  procedure. Do not lift anything that is heavier than 5 lb (4.5 kg) for one week.  No sexual activity for 1 week.  Return to your normal activities as told by your health care provider. Ask your health care provider what activities are safe for you. General instructions Take over-the-counter and prescription medicines only as told by your health care provider. Do not use any products that contain nicotine or tobacco, such as cigarettes and e-cigarettes. If you need help quitting, ask your health care provider. You may shower after 24 hours, but Do not take baths, swim, or use a hot tub for 1 week.  Do not drink alcohol for 24 hours after your procedure. Keep all follow-up visits as told by your health care provider. This is important. Contact a health care provider if: You have redness, mild swelling, or pain around your puncture site. You have fluid or blood coming from your puncture site that stops after applying firm pressure to the area. Your puncture site feels warm to the touch. You have pus or a bad smell coming from your puncture site. You have a fever. You have chest pain or discomfort that spreads to your neck, jaw, or arm. You have chest pain that is worse with lying on your back or taking a deep breath. You are sweating a lot. You feel nauseous. You have a fast or irregular heartbeat. You have shortness of breath. You are dizzy or light-headed and feel the need to lie down. You have pain or numbness in the arm or leg closest to your puncture  site. Get help right away if: Your puncture site suddenly swells. Your puncture site is bleeding and the bleeding does not stop after applying firm pressure to the area. These symptoms may represent a serious problem that is an emergency. Do not wait to see if the symptoms will go away. Get medical help right away. Call your local emergency services (911 in the [place] Do not drive yourself to the hospital. Summary After the procedure, it  is normal to have bruising and tenderness at the puncture site in your groin, neck, or forearm. Check your puncture site every day for signs of infection. Get help right away if your puncture site is bleeding and the bleeding does not stop after applying firm pressure to the area. This is a medical emergency. This information is not intended to replace advice given to you by your health care provider. Make sure you discuss any questions you have with your health care provider.  You have an appointment set up with the Atrial Fibrillation Clinic.  Multiple studies have shown that being followed by a dedicated atrial fibrillation clinic in addition to the standard care you receive from your other physicians improves health. We believe that enrollment in the atrial fibrillation clinic will allow us to better care for you.   The phone number to the Atrial Fibrillation Clinic is [phone]. The clinic is staffed Monday through Friday from 8:30am to 5pm.  Directions: The clinic is located in the [facility], 6TH FLOOR Enter the hospital at the MAIN ENTRANCE "A", use North Tower Elevators to the 6th floor.  Registration desk to the right of elevators on 6th floor  If you have any trouble locating the clinic, please don't hesitate to call [phone].    

## 2022-07-20 NOTE — Anesthesia Postprocedure Evaluation (Signed)
Anesthesia Post Note  Patient: Brian [name]  Procedure(s) Performed: ATRIAL FIBRILLATION ABLATION     Patient location during evaluation: PACU Anesthesia Type: General Level of consciousness: awake and alert Pain management: pain level controlled Vital Signs Assessment: post-procedure vital signs reviewed and stable Respiratory status: spontaneous breathing, nonlabored ventilation, respiratory function stable and patient connected to nasal cannula oxygen Cardiovascular status: blood pressure returned to baseline and stable Postop Assessment: no apparent nausea or vomiting Anesthetic complications: no  No notable events documented.  Last Vitals:  Vitals:   07/20/22 1109 07/20/22 1110  BP:  138/70  Pulse:  89  Resp:  20  Temp: 36.9 C   SpO2:  92%    Last Pain:  Vitals:   07/20/22 1109  TempSrc: Temporal  PainSc: 0-No pain                 Judyann Casasola S

## 2022-07-21 ENCOUNTER — Encounter (HOSPITAL_COMMUNITY): Payer: Self-pay | Admitting: Cardiovascular Disease

## 2022-07-21 MED FILL — Dobutamine in Dextrose 5% Inj 1 MG/ML: INTRAVENOUS | Qty: 250 | Status: AC

## 2022-08-10 DIAGNOSIS — M0579 Rheumatoid arthritis with rheumatoid factor of multiple sites without organ or systems involvement: Secondary | ICD-10-CM | POA: Diagnosis not present

## 2022-08-10 DIAGNOSIS — Z79899 Other long term (current) drug therapy: Secondary | ICD-10-CM | POA: Diagnosis not present

## 2022-08-18 ENCOUNTER — Encounter (HOSPITAL_COMMUNITY): Payer: Self-pay | Admitting: Nurse Practitioner

## 2022-08-18 ENCOUNTER — Ambulatory Visit (HOSPITAL_COMMUNITY)
Admission: RE | Admit: 2022-08-18 | Discharge: 2022-08-18 | Disposition: A | Discharge status: Home / Self Care | Payer: Medicare Other | Source: Ambulatory Visit | Attending: Nurse Practitioner | Admitting: Nurse Practitioner

## 2022-08-18 VITALS — BP 118/64 | HR 86 | Ht 70.0 in | Wt 256.8 lb

## 2022-08-18 DIAGNOSIS — Z7901 Long term (current) use of anticoagulants: Secondary | ICD-10-CM | POA: Diagnosis not present

## 2022-08-18 DIAGNOSIS — D6869 Other thrombophilia: Secondary | ICD-10-CM | POA: Diagnosis not present

## 2022-08-18 DIAGNOSIS — I509 Heart failure, unspecified: Secondary | ICD-10-CM | POA: Diagnosis not present

## 2022-08-18 DIAGNOSIS — I4892 Unspecified atrial flutter: Secondary | ICD-10-CM | POA: Diagnosis not present

## 2022-08-18 DIAGNOSIS — I110 Hypertensive heart disease with heart failure: Secondary | ICD-10-CM | POA: Diagnosis not present

## 2022-08-18 DIAGNOSIS — I4819 Other persistent atrial fibrillation: Secondary | ICD-10-CM

## 2022-08-18 DIAGNOSIS — I4891 Unspecified atrial fibrillation: Secondary | ICD-10-CM | POA: Insufficient documentation

## 2022-08-18 MED ORDER — AMIODARONE HCL 200 MG PO TABS: 200.0000 mg | ORAL_TABLET | Freq: Two times a day (BID) | ORAL | 3 refills | Status: DC

## 2022-08-18 NOTE — Patient Instructions (Signed)
Increase amiodarone to 249m twice a day

## 2022-08-18 NOTE — Progress Notes (Signed)
Primary Care Physician: Orpah Melter, MD Referring Physician:Dr. Mealor EP: Dr. Boneta Lucks Brian [name] is a 72 y.o. male with a h/o atrial flutter ablation in 2018 and underwent afib ablation 07/20/22. Ekg today shows rate controlled afib. He felt very well for the first 2 weeks and then noticed that he felt more short of breath in the last 2 weeks. He still feels better than prior to ablation. He was placed on amiodarone 200 mg daily at time of ablation. No swallowing or groin issues.   Today, he denies symptoms of palpitations, chest pain, shortness of breath, orthopnea, PND, lower extremity edema, dizziness, presyncope, syncope, or neurologic sequela. The patient is tolerating medications without difficulties and is otherwise without complaint today.   Past Medical History:  Diagnosis Date   Anemia    none since 2019   Anxiety    Atrial flutter ([place])    a. 08/2012   CHF (congestive heart failure) (HCC)    chronic diastolic chf, pt denies   GERD (gastroesophageal reflux disease)    Gout    pt denies   History of kidney stones    passed   Hypertension    Obesity    Paroxysmal atrial fibrillation ([place])    Pneumonia 09/2016   Rheumatoid arthritis ([place])    "a little bit qwhere" (07/22/2017)   Shortness of breath    with heavy  exertion   Tobacco abuse    Past Surgical History:  Procedure Laterality Date   A-FLUTTER ABLATION N/A 12/29/2016   Procedure: A-Flutter Ablation;  Surgeon: [name], MD;  Location: [place] [facility];  Service: Cardiovascular;  Laterality: N/A;   ATRIAL FIBRILLATION ABLATION N/A 07/20/2022   Procedure: ATRIAL FIBRILLATION ABLATION;  Surgeon: Melida Quitter, MD;  Location: [place] [facility];  Service: Cardiovascular;  Laterality: N/A;   CARDIOVERSION N/A 11/05/2016   Procedure: CARDIOVERSION;  Surgeon: Sueanne Margarita, MD;  Location: [facility] ENDOSCOPY;  Service: Cardiovascular;  Laterality: N/A;   CARDIOVERSION N/A 06/09/2022   Procedure:  CARDIOVERSION;  Surgeon: Jerline Pain, MD;  Location: [place];  Service: Cardiovascular;  Laterality: N/A;   IR THORACENTESIS ASP PLEURAL SPACE W/IMG GUIDE  10/01/2016   JOINT REPLACEMENT     Right hip and left knee   MICROLARYNGOSCOPY  09/02/2007   with excision of right vocal cord mass, Dr. Constance Holster   PROSTATE BIOPSY N/A 02/22/2020   Procedure: BIOPSY TRANSRECTAL ULTRASONIC PROSTATE (TUBP);  Surgeon: Irine Seal, MD;  Location: [facility];  Service: Urology;  Laterality: N/A;   TOTAL HIP ARTHROPLASTY Right 11/17/2017   Procedure: RIGHT TOTAL HIP ARTHROPLASTY ANTERIOR APPROACH;  Surgeon: Leandrew Koyanagi, MD;  Location: [place];  Service: Orthopedics;  Laterality: Right;   TOTAL HIP ARTHROPLASTY Left 03/07/2018   Procedure: LEFT TOTAL HIP ARTHROPLASTY ANTERIOR APPROACH;  Surgeon: Leandrew Koyanagi, MD;  Location: [place];  Service: Orthopedics;  Laterality: Left;   TOTAL KNEE ARTHROPLASTY Left 07/22/2017   Procedure: LEFT TOTAL KNEE ARTHROPLASTY;  Surgeon: Leandrew Koyanagi, MD;  Location: [place];  Service: Orthopedics;  Laterality: Left;   TRANSURETHRAL RESECTION OF BLADDER TUMOR N/A 04/02/2020   Procedure: RESTAGING TRANSURETHRAL RESECTION OF BLADDER TUMOR (TURBT);  Surgeon: Irine Seal, MD;  Location: [facility];  Service: Urology;  Laterality: N/A;  GENERAL ANESTHESIA WIHT PARALYSIS   TRANSURETHRAL RESECTION OF BLADDER TUMOR WITH MITOMYCIN-C N/A 02/22/2020   Procedure: TRANSURETHRAL RESECTION OF BLADDER TUMOR WITH   GEMCITABINE;  Surgeon: Jeffie Pollock,  Jenny Reichmann, MD;  Location: [facility];  Service: Urology;  Laterality: N/A;    Current Outpatient Medications  Medication Sig Dispense Refill   amiodarone (PACERONE) 200 MG tablet Take 1 tablet (200 mg total) by mouth 2 (two) times daily for 14 days. 28 tablet 0   amiodarone (PACERONE) 200 MG tablet Take 1 tablet (200 mg total) by mouth daily. 30 tablet 3   cephALEXin (KEFLEX) 250 MG capsule Take 250 mg by mouth at bedtime.      cyanocobalamin (VITAMIN B12) 1000 MCG tablet Take 1,000 mcg by mouth daily.     diltiazem (CARDIZEM CD) 120 MG 24 hr capsule Take 1 capsule (120 mg total) by mouth daily. 90 capsule 3   Docusate Calcium (STOOL SOFTENER PO) Take 1 tablet by mouth daily.     folic acid (FOLVITE) 1 MG tablet Take 1 mg by mouth daily.     furosemide (LASIX) 40 MG tablet Take 1 tablet (40 mg total) by mouth daily. 90 tablet 3   golimumab (SIMPONI ARIA) 50 MG/4ML SOLN injection Inject 50 mg into the vein every 8 (eight) weeks.     hydroxychloroquine (PLAQUENIL) 200 MG tablet Take 200 mg by mouth 2 (two) times daily.     KLOR-CON M20 20 MEQ tablet TAKE 1 TABLET BY MOUTH EVERY DAY (Patient taking differently: Take 20 mEq by mouth every other day.) 90 tablet 3   methotrexate (RHEUMATREX) 2.5 MG tablet Take 25 mg by mouth every Friday. 10 tablets 1 time a week on Fridays in the evening  1   pantoprazole (PROTONIX) 40 MG tablet Take 1 tablet (40 mg total) by mouth daily. 30 tablet 0   rivaroxaban (XARELTO) 20 MG TABS tablet Take 1 tablet (20 mg total) by mouth daily with supper. 30 tablet 6   tamsulosin (FLOMAX) 0.4 MG CAPS capsule Take 0.4 mg by mouth at bedtime.     No current facility-administered medications for this encounter.    Allergies  Allergen Reactions   Aquacel-Ag Surgical Hydrofiber [Wound Dressings] Other (See Comments)    Tears skin   Tape Rash and Other (See Comments)    Paper tape is preferred, please!! Adhesive tape tears skin    Social History   Socioeconomic History   Marital status: Divorced    Spouse name: Not on file   Number of children: 3   Years of education: Not on file   Highest education level: Not on file  Occupational History   Not on file  Tobacco Use   Smoking status: Every Day    Packs/day: 0.13    Years: 45.00    Total pack years: 5.85    Types: Cigarettes   Smokeless tobacco: Never   Tobacco comments:    "quit off and on"  Vaping Use   Vaping Use: Never used   Substance and Sexual Activity   Alcohol use: Yes    Comment: 07/22/2017 "nothing since 2016"   Drug use: No    Comment: 07/22/2017 "none since 11/2016" cocaine (sister is not aware)   Sexual activity: Not Currently  Other Topics Concern   Not on file  Social History Narrative   Lives in [place]    Works at a convenience store   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   Divorced with 3 children and 9 grandchildren, he states ex-wife is still a friend   Scientist, physiological Strain: Not on file  Food Insecurity: Not on file  Transportation Needs:  Not on file  Physical Activity: Not on file  Stress: Not on file  Social Connections: Not on file  Intimate Partner Violence: Not on file    Family History  Problem Relation Age of Onset   Cancer Mother    Heart disease Father    Heart attack Father    Cancer Father    Diabetes Sister     ROS- All systems are reviewed and negative except as per the HPI above  Physical Exam: Vitals:   08/18/22 1320  Height: 5' 10"$  (1.778 m)   Wt Readings from Last 3 Encounters:  07/20/22 117.5 kg  06/09/22 117 kg  05/25/22 116.1 kg    Labs: Lab Results  Component Value Date   NA 138 07/06/2022   K 4.5 07/06/2022   CL 97 07/06/2022   CO2 23 07/06/2022   GLUCOSE 111 (H) 07/06/2022   BUN 15 07/06/2022   CREATININE 0.80 07/06/2022   CALCIUM 9.1 07/06/2022   PHOS 2.5 10/01/2016   MG 2.1 04/29/2022   Lab Results  Component Value Date   INR 0.97 03/07/2018   Lab Results  Component Value Date   CHOL 148 02/21/2020   HDL 45 02/21/2020   LDLCALC 79 02/21/2020   TRIG 133 02/21/2020     GEN- The patient is well appearing, alert and oriented x 3 today.   Head- normocephalic, atraumatic Eyes-  Sclera clear, conjunctiva pink Ears- hearing intact Oropharynx- clear Neck- supple, no JVP Lymph- no cervical lymphadenopathy Lungs- Clear to ausculation bilaterally, normal work of breathing Heart- irregular rate and  rhythm, no murmurs, rubs or gallops, PMI not laterally displaced GI- soft, NT, ND, + BS Extremities- no clubbing, cyanosis, or edema MS- no significant deformity or atrophy Skin- no rash or lesion Psych- euthymic mood, full affect Neuro- strength and sensation are intact  EKG-Vent. rate 86 BPM PR interval * ms QRS duration 82 ms QT/QTcB 376/449 ms P-R-T axes * 88 74 Atrial fibrillation Abnormal ECG When compared with ECG of 20-Jul-2022 10:50, PREVIOUS ECG IS PRESENT    Assessment and Plan:  1. Afib  S/p ablation 1/22 He is in afib today and by his symptoms indicate he  may have been for the last 2 weeks  I will increase amiodarone to 200 mg bid  Continue diltiazem at 120 mg daily  2. CHA2DS2VASc  score of at least 4 Continue xarelto 20 mg daily   3. HTN Stable   F/u in afib clinic in 7-10 days   Geroge Baseman. Semaj Kham, [facility] [address] [place], [place] [address] [phone]

## 2022-08-19 DIAGNOSIS — R350 Frequency of micturition: Secondary | ICD-10-CM | POA: Diagnosis not present

## 2022-08-19 DIAGNOSIS — R972 Elevated prostate specific antigen [PSA]: Secondary | ICD-10-CM | POA: Diagnosis not present

## 2022-08-19 DIAGNOSIS — N3020 Other chronic cystitis without hematuria: Secondary | ICD-10-CM | POA: Diagnosis not present

## 2022-08-19 DIAGNOSIS — N401 Enlarged prostate with lower urinary tract symptoms: Secondary | ICD-10-CM | POA: Diagnosis not present

## 2022-08-19 DIAGNOSIS — Z8551 Personal history of malignant neoplasm of bladder: Secondary | ICD-10-CM | POA: Diagnosis not present

## 2022-08-25 DIAGNOSIS — Z6836 Body mass index (BMI) 36.0-36.9, adult: Secondary | ICD-10-CM | POA: Diagnosis not present

## 2022-08-25 DIAGNOSIS — M1991 Primary osteoarthritis, unspecified site: Secondary | ICD-10-CM | POA: Diagnosis not present

## 2022-08-25 DIAGNOSIS — M0579 Rheumatoid arthritis with rheumatoid factor of multiple sites without organ or systems involvement: Secondary | ICD-10-CM | POA: Diagnosis not present

## 2022-08-25 DIAGNOSIS — R5382 Chronic fatigue, unspecified: Secondary | ICD-10-CM | POA: Diagnosis not present

## 2022-08-25 DIAGNOSIS — D090 Carcinoma in situ of bladder: Secondary | ICD-10-CM | POA: Diagnosis not present

## 2022-08-25 DIAGNOSIS — E669 Obesity, unspecified: Secondary | ICD-10-CM | POA: Diagnosis not present

## 2022-08-25 DIAGNOSIS — D508 Other iron deficiency anemias: Secondary | ICD-10-CM | POA: Diagnosis not present

## 2022-08-27 ENCOUNTER — Ambulatory Visit (HOSPITAL_COMMUNITY)
Admission: RE | Admit: 2022-08-27 | Discharge: 2022-08-27 | Disposition: A | Discharge status: Home / Self Care | Payer: Medicare Other | Source: Ambulatory Visit | Attending: Nurse Practitioner | Admitting: Nurse Practitioner

## 2022-08-27 ENCOUNTER — Encounter (HOSPITAL_COMMUNITY): Payer: Self-pay | Admitting: Nurse Practitioner

## 2022-08-27 VITALS — BP 158/68 | HR 85 | Ht 70.0 in | Wt 257.8 lb

## 2022-08-27 DIAGNOSIS — I4819 Other persistent atrial fibrillation: Secondary | ICD-10-CM | POA: Insufficient documentation

## 2022-08-27 DIAGNOSIS — Z7901 Long term (current) use of anticoagulants: Secondary | ICD-10-CM | POA: Insufficient documentation

## 2022-08-27 DIAGNOSIS — I10 Essential (primary) hypertension: Secondary | ICD-10-CM | POA: Insufficient documentation

## 2022-08-27 DIAGNOSIS — D6869 Other thrombophilia: Secondary | ICD-10-CM

## 2022-08-27 DIAGNOSIS — Z79899 Other long term (current) drug therapy: Secondary | ICD-10-CM | POA: Insufficient documentation

## 2022-08-27 DIAGNOSIS — I4892 Unspecified atrial flutter: Secondary | ICD-10-CM | POA: Insufficient documentation

## 2022-08-27 NOTE — H&P (View-Only) (Signed)
 Primary Care Physician: Meyers, Stephen, MD Referring Physician:Dr. Mealor EP: Dr. Mealor    Brian [name] is a 71 y.o. male with a h/o atrial flutter ablation in 2018 and underwent afib ablation 07/20/22. Ekg today shows rate controlled afib. He felt very well for the first 2 weeks and then noticed that he felt more short of breath in the last 2 weeks. He still feels better than prior to ablation. He was placed on amiodarone 200 mg daily at time of ablation. No swallowing or groin issues.   F/u in afib clinic, 08/27/22. He has been loading on a higher dose of amiodarone at 200 mg bid after being found in atrial flutter on last visit here with amio being started at 200 mg daily at time of ablation 1/22. EKG shows rate controlled atrial flutter with variable block.   Today, he denies symptoms of palpitations, chest pain, shortness of breath, orthopnea, PND, lower extremity edema, dizziness, presyncope, syncope, or neurologic sequela. The patient is tolerating medications without difficulties and is otherwise without complaint today.   Past Medical History:  Diagnosis Date   Anemia    none since 2019   Anxiety    Atrial flutter (HCC)    a. 08/2012   CHF (congestive heart failure) (HCC)    chronic diastolic chf, pt denies   GERD (gastroesophageal reflux disease)    Gout    pt denies   History of kidney stones    passed   Hypertension    Obesity    Paroxysmal atrial fibrillation (HCC)    Pneumonia 09/2016   Rheumatoid arthritis (HCC)    "a little bit qwhere" (07/22/2017)   Shortness of breath    with heavy  exertion   Tobacco abuse    Past Surgical History:  Procedure Laterality Date   A-FLUTTER ABLATION N/A 12/29/2016   Procedure: A-Flutter Ablation;  Surgeon: Allred, Ollis, MD;  [facility];  Service: Cardiovascular;  Laterality: N/A;   ATRIAL FIBRILLATION ABLATION N/A 07/20/2022   Procedure: ATRIAL FIBRILLATION ABLATION;  Surgeon: Mealor, Augustus E, MD;   [facility];  Service: Cardiovascular;  Laterality: N/A;   CARDIOVERSION N/A 11/05/2016   Procedure: CARDIOVERSION;  Surgeon: Turner, Traci R, MD;  [facility]: [facility];  Service: Cardiovascular;  Laterality: N/A;   CARDIOVERSION N/A 06/09/2022   Procedure: CARDIOVERSION;  Surgeon: Skains, Mark C, MD;  [facility];  Service: Cardiovascular;  Laterality: N/A;   IR THORACENTESIS ASP PLEURAL SPACE W/IMG GUIDE  10/01/2016   JOINT REPLACEMENT     Right hip and left knee   MICROLARYNGOSCOPY  09/02/2007   with excision of right vocal cord mass, Dr. Rosen   PROSTATE BIOPSY N/A 02/22/2020   Procedure: BIOPSY TRANSRECTAL ULTRASONIC PROSTATE (TUBP);  Surgeon: Wrenn, John, MD;  Location: [facility];  Service: Urology;  Laterality: N/A;   TOTAL HIP ARTHROPLASTY Right 11/17/2017   Procedure: RIGHT TOTAL HIP ARTHROPLASTY ANTERIOR APPROACH;  Surgeon: Xu, Naiping M, MD;  [facility];  Service: Orthopedics;  Laterality: Right;   TOTAL HIP ARTHROPLASTY Left 03/07/2018   Procedure: LEFT TOTAL HIP ARTHROPLASTY ANTERIOR APPROACH;  Surgeon: Xu, Naiping M, MD;  [facility];  Service: Orthopedics;  Laterality: Left;   TOTAL KNEE ARTHROPLASTY Left 07/22/2017   Procedure: LEFT TOTAL KNEE ARTHROPLASTY;  Surgeon: Xu, Naiping M, MD;  [facility];  Service: Orthopedics;  Laterality: Left;   TRANSURETHRAL RESECTION OF BLADDER TUMOR N/A 04/02/2020   Procedure: RESTAGING TRANSURETHRAL   RESECTION OF BLADDER TUMOR (TURBT);  Surgeon: Wrenn, John, MD;  Location: [facility];  Service: Urology;  Laterality: N/A;  GENERAL ANESTHESIA WIHT PARALYSIS   TRANSURETHRAL RESECTION OF BLADDER TUMOR WITH MITOMYCIN-C N/A 02/22/2020   Procedure: TRANSURETHRAL RESECTION OF BLADDER TUMOR WITH   GEMCITABINE;  Surgeon: Wrenn, John, MD;  Location: [facility];  Service: Urology;  Laterality: N/A;    Current Outpatient Medications  Medication Sig Dispense Refill    amiodarone (PACERONE) 200 MG tablet Take 1 tablet (200 mg total) by mouth 2 (two) times daily. 30 tablet 3   cephALEXin (KEFLEX) 250 MG capsule Take 250 mg by mouth at bedtime.     cyanocobalamin (VITAMIN B12) 1000 MCG tablet Take 1,000 mcg by mouth daily.     diltiazem (CARDIZEM CD) 120 MG 24 hr capsule Take 1 capsule (120 mg total) by mouth daily. 90 capsule 3   folic acid (FOLVITE) 1 MG tablet Take 1 mg by mouth daily.     furosemide (LASIX) 40 MG tablet Take 1 tablet (40 mg total) by mouth daily. 90 tablet 3   golimumab (SIMPONI ARIA) 50 MG/4ML SOLN injection Inject 50 mg into the vein every 8 (eight) weeks.     hydroxychloroquine (PLAQUENIL) 200 MG tablet Take 200 mg by mouth 2 (two) times daily.     KLOR-CON M20 20 MEQ tablet TAKE 1 TABLET BY MOUTH EVERY DAY (Patient not taking: Reported on 08/18/2022) 90 tablet 3   methotrexate (RHEUMATREX) 2.5 MG tablet Take 25 mg by mouth every Friday. 10 tablets 1 time a week on Fridays in the evening  1   pantoprazole (PROTONIX) 40 MG tablet Take 1 tablet (40 mg total) by mouth daily. 30 tablet 0   rivaroxaban (XARELTO) 20 MG TABS tablet Take 1 tablet (20 mg total) by mouth daily with supper. 30 tablet 6   tamsulosin (FLOMAX) 0.4 MG CAPS capsule Take 0.4 mg by mouth at bedtime.     No current facility-administered medications for this encounter.    Allergies  Allergen Reactions   Aquacel-Ag Surgical Hydrofiber [Wound Dressings] Other (See Comments)    Tears skin   Tape Rash and Other (See Comments)    Paper tape is preferred, please!! Adhesive tape tears skin    Social History   Socioeconomic History   Marital status: Divorced    Spouse name: Not on file   Number of children: 3   Years of education: Not on file   Highest education level: Not on file  Occupational History   Not on file  Tobacco Use   Smoking status: Every Day    Packs/day: 0.13    Years: 45.00    Total pack years: 5.85    Types: Cigarettes   Smokeless tobacco: Never    Tobacco comments:    "quit off and on"  Vaping Use   Vaping Use: Never used  Substance and Sexual Activity   Alcohol use: Yes    Comment: 07/22/2017 "nothing since 2016"   Drug use: No    Comment: 07/22/2017 "none since 11/2016" cocaine (sister is not aware)   Sexual activity: Not Currently  Other Topics Concern   Not on file  Social History Narrative   Lives in [place]    Works at a convenience store   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   Divorced with 3 children and 9 grandchildren, he states ex-wife is still a friend   Social Determinants of Health   Financial Resource Strain: Not   on file  Food Insecurity: Not on file  Transportation Needs: Not on file  Physical Activity: Not on file  Stress: Not on file  Social Connections: Not on file  Intimate Partner Violence: Not on file    Family History  Problem Relation Age of Onset   Cancer Mother    Heart disease Father    Heart attack Father    Cancer Father    Diabetes Sister     ROS- All systems are reviewed and negative except as per the HPI above  Physical Exam: Vitals:   08/27/22 0830  Height: 5' 10" (1.778 m)   Wt Readings from Last 3 Encounters:  08/18/22 116.5 kg  07/20/22 117.5 kg  06/09/22 117 kg    Labs: Lab Results  Component Value Date   NA 138 07/06/2022   K 4.5 07/06/2022   CL 97 07/06/2022   CO2 23 07/06/2022   GLUCOSE 111 (H) 07/06/2022   BUN 15 07/06/2022   CREATININE 0.80 07/06/2022   CALCIUM 9.1 07/06/2022   PHOS 2.5 10/01/2016   MG 2.1 04/29/2022   Lab Results  Component Value Date   INR 0.97 03/07/2018   Lab Results  Component Value Date   CHOL 148 02/21/2020   HDL 45 02/21/2020   LDLCALC 79 02/21/2020   TRIG 133 02/21/2020     GEN- The patient is well appearing, alert and oriented x 3 today.   Head- normocephalic, atraumatic Eyes-  Sclera clear, conjunctiva pink Ears- hearing intact Oropharynx- clear Neck- supple, no JVP Lymph- no cervical lymphadenopathy Lungs- Clear  to ausculation bilaterally, normal work of breathing Heart- irregular rate and rhythm, no murmurs, rubs or gallops, PMI not laterally displaced GI- soft, NT, ND, + BS Extremities- no clubbing, cyanosis, or edema MS- no significant deformity or atrophy Skin- no rash or lesion Psych- euthymic mood, full affect Neuro- strength and sensation are intact  EKG Vent. rate 85 BPM PR interval * ms QRS duration 84 ms QT/QTcB 378/449 ms P-R-T axes * 91 80 Atrial flutter with variable A-V block Rightward axis Abnormal ECG When compared with ECG of 18-Aug-2022 13:34, PREVIOUS ECG IS PRESENT    Assessment and Plan:  1. Afib/flutter  S/p ablation 1/22 He is in atrial flutter today  despite increase of amiodarone to 200 mg bid  Will plan on cardioversion,risk vrs benefit discussed and pt wants to proceed  Continue amiodarone at  200 mg bid  Continue diltiazem at 120 mg daily Cbc/bmet  2. CHA2DS2VASc  score of at least 4 Continue xarelto 20 mg daily  States no missed doses for the last 3 weeks   3. HTN Stable   F/u in afib clinic one week after cardioversion   Baylon Santelli C. Zyona Pettaway, ANP-C Af[facility] [address] [place],  [address] [phone]   

## 2022-08-27 NOTE — Progress Notes (Signed)
Primary Care Physician: Orpah Melter, MD Referring Physician:Dr. Mealor EP: Dr. Boneta Lucks Brian [name] is a 72 y.o. male with a h/o atrial flutter ablation in 2018 and underwent afib ablation 07/20/22. Ekg today shows rate controlled afib. He felt very well for the first 2 weeks and then noticed that he felt more short of breath in the last 2 weeks. He still feels better than prior to ablation. He was placed on amiodarone 200 mg daily at time of ablation. No swallowing or groin issues.   F/u in afib clinic, 08/27/22. He has been loading on a higher dose of amiodarone at 200 mg bid after being found in atrial flutter on last visit here with amio being started at 200 mg daily at time of ablation 1/22. EKG shows rate controlled atrial flutter with variable block.   Today, he denies symptoms of palpitations, chest pain, shortness of breath, orthopnea, PND, lower extremity edema, dizziness, presyncope, syncope, or neurologic sequela. The patient is tolerating medications without difficulties and is otherwise without complaint today.   Past Medical History:  Diagnosis Date   Anemia    none since 2019   Anxiety    Atrial flutter ([place])    a. 08/2012   CHF (congestive heart failure) (HCC)    chronic diastolic chf, pt denies   GERD (gastroesophageal reflux disease)    Gout    pt denies   History of kidney stones    passed   Hypertension    Obesity    Paroxysmal atrial fibrillation ([place])    Pneumonia 09/2016   Rheumatoid arthritis ([place])    "a little bit qwhere" (07/22/2017)   Shortness of breath    with heavy  exertion   Tobacco abuse    Past Surgical History:  Procedure Laterality Date   A-FLUTTER ABLATION N/A 12/29/2016   Procedure: A-Flutter Ablation;  Surgeon: [name], MD;  Location: [facility];  Service: Cardiovascular;  Laterality: N/A;   ATRIAL FIBRILLATION ABLATION N/A 07/20/2022   Procedure: ATRIAL FIBRILLATION ABLATION;  Surgeon: Melida Quitter, MD;   Location: [facility];  Service: Cardiovascular;  Laterality: N/A;   CARDIOVERSION N/A 11/05/2016   Procedure: CARDIOVERSION;  Surgeon: Sueanne Margarita, MD;  Location: [facility];  Service: Cardiovascular;  Laterality: N/A;   CARDIOVERSION N/A 06/09/2022   Procedure: CARDIOVERSION;  Surgeon: Jerline Pain, MD;  Location: [facility];  Service: Cardiovascular;  Laterality: N/A;   IR THORACENTESIS ASP PLEURAL SPACE W/IMG GUIDE  10/01/2016   JOINT REPLACEMENT     Right hip and left knee   MICROLARYNGOSCOPY  09/02/2007   with excision of right vocal cord mass, Dr. Constance Holster   PROSTATE BIOPSY N/A 02/22/2020   Procedure: BIOPSY TRANSRECTAL ULTRASONIC PROSTATE (TUBP);  Surgeon: Irine Seal, MD;  Location: [facility];  Service: Urology;  Laterality: N/A;   TOTAL HIP ARTHROPLASTY Right 11/17/2017   Procedure: RIGHT TOTAL HIP ARTHROPLASTY ANTERIOR APPROACH;  Surgeon: Leandrew Koyanagi, MD;  Location: [facility];  Service: Orthopedics;  Laterality: Right;   TOTAL HIP ARTHROPLASTY Left 03/07/2018   Procedure: LEFT TOTAL HIP ARTHROPLASTY ANTERIOR APPROACH;  Surgeon: Leandrew Koyanagi, MD;  Location: [facility];  Service: Orthopedics;  Laterality: Left;   TOTAL KNEE ARTHROPLASTY Left 07/22/2017   Procedure: LEFT TOTAL KNEE ARTHROPLASTY;  Surgeon: Leandrew Koyanagi, MD;  Location: [facility];  Service: Orthopedics;  Laterality: Left;   TRANSURETHRAL RESECTION OF BLADDER TUMOR N/A 04/02/2020   Procedure: RESTAGING TRANSURETHRAL  RESECTION OF BLADDER TUMOR (TURBT);  Surgeon: Irine Seal, MD;  Location: [facility];  Service: Urology;  Laterality: N/A;  GENERAL ANESTHESIA WIHT PARALYSIS   TRANSURETHRAL RESECTION OF BLADDER TUMOR WITH MITOMYCIN-C N/A 02/22/2020   Procedure: TRANSURETHRAL RESECTION OF BLADDER TUMOR WITH   GEMCITABINE;  Surgeon: Irine Seal, MD;  Location: [facility];  Service: Urology;  Laterality: N/A;    Current Outpatient Medications  Medication Sig Dispense Refill    amiodarone (PACERONE) 200 MG tablet Take 1 tablet (200 mg total) by mouth 2 (two) times daily. 30 tablet 3   cephALEXin (KEFLEX) 250 MG capsule Take 250 mg by mouth at bedtime.     cyanocobalamin (VITAMIN B12) 1000 MCG tablet Take 1,000 mcg by mouth daily.     diltiazem (CARDIZEM CD) 120 MG 24 hr capsule Take 1 capsule (120 mg total) by mouth daily. 90 capsule 3   folic acid (FOLVITE) 1 MG tablet Take 1 mg by mouth daily.     furosemide (LASIX) 40 MG tablet Take 1 tablet (40 mg total) by mouth daily. 90 tablet 3   golimumab (SIMPONI ARIA) 50 MG/4ML SOLN injection Inject 50 mg into the vein every 8 (eight) weeks.     hydroxychloroquine (PLAQUENIL) 200 MG tablet Take 200 mg by mouth 2 (two) times daily.     KLOR-CON M20 20 MEQ tablet TAKE 1 TABLET BY MOUTH EVERY DAY (Patient not taking: Reported on 08/18/2022) 90 tablet 3   methotrexate (RHEUMATREX) 2.5 MG tablet Take 25 mg by mouth every Friday. 10 tablets 1 time a week on Fridays in the evening  1   pantoprazole (PROTONIX) 40 MG tablet Take 1 tablet (40 mg total) by mouth daily. 30 tablet 0   rivaroxaban (XARELTO) 20 MG TABS tablet Take 1 tablet (20 mg total) by mouth daily with supper. 30 tablet 6   tamsulosin (FLOMAX) 0.4 MG CAPS capsule Take 0.4 mg by mouth at bedtime.     No current facility-administered medications for this encounter.    Allergies  Allergen Reactions   Aquacel-Ag Surgical Hydrofiber [Wound Dressings] Other (See Comments)    Tears skin   Tape Rash and Other (See Comments)    Paper tape is preferred, please!! Adhesive tape tears skin    Social History   Socioeconomic History   Marital status: Divorced    Spouse name: Not on file   Number of children: 3   Years of education: Not on file   Highest education level: Not on file  Occupational History   Not on file  Tobacco Use   Smoking status: Every Day    Packs/day: 0.13    Years: 45.00    Total pack years: 5.85    Types: Cigarettes   Smokeless tobacco: Never    Tobacco comments:    "quit off and on"  Vaping Use   Vaping Use: Never used  Substance and Sexual Activity   Alcohol use: Yes    Comment: 07/22/2017 "nothing since 2016"   Drug use: No    Comment: 07/22/2017 "none since 11/2016" cocaine (sister is not aware)   Sexual activity: Not Currently  Other Topics Concern   Not on file  Social History Narrative   Lives in [place]    Works at a convenience store   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   Divorced with 3 children and 9 grandchildren, he states ex-wife is still a friend   Scientist, physiological Strain: Not  on file  Food Insecurity: Not on file  Transportation Needs: Not on file  Physical Activity: Not on file  Stress: Not on file  Social Connections: Not on file  Intimate Partner Violence: Not on file    Family History  Problem Relation Age of Onset   Cancer Mother    Heart disease Father    Heart attack Father    Cancer Father    Diabetes Sister     ROS- All systems are reviewed and negative except as per the HPI above  Physical Exam: Vitals:   08/27/22 0830  Height: '5\' 10"'$  (1.778 m)   Wt Readings from Last 3 Encounters:  08/18/22 116.5 kg  07/20/22 117.5 kg  06/09/22 117 kg    Labs: Lab Results  Component Value Date   NA 138 07/06/2022   K 4.5 07/06/2022   CL 97 07/06/2022   CO2 23 07/06/2022   GLUCOSE 111 (H) 07/06/2022   BUN 15 07/06/2022   CREATININE 0.80 07/06/2022   CALCIUM 9.1 07/06/2022   PHOS 2.5 10/01/2016   MG 2.1 04/29/2022   Lab Results  Component Value Date   INR 0.97 03/07/2018   Lab Results  Component Value Date   CHOL 148 02/21/2020   HDL 45 02/21/2020   LDLCALC 79 02/21/2020   TRIG 133 02/21/2020     GEN- The patient is well appearing, alert and oriented x 3 today.   Head- normocephalic, atraumatic Eyes-  Sclera clear, conjunctiva pink Ears- hearing intact Oropharynx- clear Neck- supple, no JVP Lymph- no cervical lymphadenopathy Lungs- Clear  to ausculation bilaterally, normal work of breathing Heart- irregular rate and rhythm, no murmurs, rubs or gallops, PMI not laterally displaced GI- soft, NT, ND, + BS Extremities- no clubbing, cyanosis, or edema MS- no significant deformity or atrophy Skin- no rash or lesion Psych- euthymic mood, full affect Neuro- strength and sensation are intact  EKG Vent. rate 85 BPM PR interval * ms QRS duration 84 ms QT/QTcB 378/449 ms P-R-T axes * 91 80 Atrial flutter with variable A-V block Rightward axis Abnormal ECG When compared with ECG of 18-Aug-2022 13:34, PREVIOUS ECG IS PRESENT    Assessment and Plan:  1. Afib/flutter  S/p ablation 1/22 He is in atrial flutter today  despite increase of amiodarone to 200 mg bid  Will plan on cardioversion,risk vrs benefit discussed and pt wants to proceed  Continue amiodarone at  200 mg bid  Continue diltiazem at 120 mg daily Cbc/bmet  2. CHA2DS2VASc  score of at least 4 Continue xarelto 20 mg daily  States no missed doses for the last 3 weeks   3. HTN Stable   F/u in afib clinic one week after cardioversion   Butch Penny C. Kaleen Rochette, [facility] [address] [place], [place] [address] [phone]

## 2022-08-27 NOTE — Patient Instructions (Signed)
Cardioversion scheduled for: Monday, March 18th   - Arrive at the [facility] and go to admitting at [facility] not eat or drink anything after midnight the night prior to your procedure.   - Take all your morning medication (except diabetic medications) with a sip of water prior to arrival.  - You will not be able to drive home after your procedure.    - Do NOT miss any doses of your blood thinner - if you should miss a dose please notify our office immediately.   - If you feel as if you go back into normal rhythm prior to scheduled cardioversion, please notify our office immediately.   If your procedure is canceled in the cardioversion suite you will be charged a cancellation fee.    Hold medication 7 days prior to scheduled procedure/anesthesia.  Restart medication on the normal dosing day after scheduled procedure/anesthesia  Dulaglutide (Trulicity) Exenatide extended release (Bydureon bcise) Semaglutide (Ozempic) (WEGOVY)  Tirzepatide (Mounjaro)     Hold medication 24 hours prior to scheduled procedure/anesthesia.   Restart medication on the following day after scheduled procedure/anesthesia   Exenatide (Byetta)  Liraglutide (Victoza, Saxenda)  Lixisenatide (Adlyxin)  Semaglutide (Rybelsus) Polyethylene Glycol Loxenatide   For those patients who have a scheduled procedure/anesthesia on the same day of the week as their dose, hold the medication on the day of surgery.  They can take their scheduled dose the week before.  **Patients on the above medications scheduled for elective procedures that have not held the medication for the appropriate amount of time are at risk of cancellation or change in the anesthetic plan.

## 2022-09-04 DIAGNOSIS — Z5111 Encounter for antineoplastic chemotherapy: Secondary | ICD-10-CM | POA: Diagnosis not present

## 2022-09-04 DIAGNOSIS — C673 Malignant neoplasm of anterior wall of bladder: Secondary | ICD-10-CM | POA: Diagnosis not present

## 2022-09-08 ENCOUNTER — Ambulatory Visit (HOSPITAL_COMMUNITY)
Admission: RE | Admit: 2022-09-08 | Discharge: 2022-09-08 | Disposition: A | Discharge status: Home / Self Care | Payer: Medicare Other | Source: Ambulatory Visit | Attending: Nurse Practitioner | Admitting: Nurse Practitioner

## 2022-09-08 DIAGNOSIS — I4819 Other persistent atrial fibrillation: Secondary | ICD-10-CM | POA: Diagnosis not present

## 2022-09-08 LAB — CBC
HCT: 36.7 % — ABNORMAL LOW (ref 39.0–52.0)
Hemoglobin: 10.8 g/dL — ABNORMAL LOW (ref 13.0–17.0)
MCH: 25.3 pg — ABNORMAL LOW (ref 26.0–34.0)
MCHC: 29.4 g/dL — ABNORMAL LOW (ref 30.0–36.0)
MCV: 85.9 fL (ref 80.0–100.0)
Platelets: 266 10*3/uL (ref 150–400)
RBC: 4.27 MIL/uL (ref 4.22–5.81)
RDW: 18 % — ABNORMAL HIGH (ref 11.5–15.5)
WBC: 6.1 10*3/uL (ref 4.0–10.5)
nRBC: 0 % (ref 0.0–0.2)

## 2022-09-08 LAB — BASIC METABOLIC PANEL
Anion gap: 14 (ref 5–15)
BUN: 10 mg/dL (ref 8–23)
CO2: 23 mmol/L (ref 22–32)
Calcium: 8.7 mg/dL — ABNORMAL LOW (ref 8.9–10.3)
Chloride: 100 mmol/L (ref 98–111)
Creatinine, Ser: 0.73 mg/dL (ref 0.61–1.24)
GFR, Estimated: >60 mL/min (ref >60)
Glucose, Bld: 133 mg/dL — ABNORMAL HIGH (ref 70–99)
Potassium: 4.5 mmol/L (ref 3.5–5.1)
Sodium: 137 mmol/L (ref 135–145)

## 2022-09-08 MED ORDER — AMIODARONE HCL 200 MG PO TABS: 200.0000 mg | ORAL_TABLET | Freq: Every day | ORAL | 3 refills | Status: DC

## 2022-09-08 NOTE — Patient Instructions (Signed)
Decrease amiodarone to 200mg once a day 

## 2022-09-11 DIAGNOSIS — C673 Malignant neoplasm of anterior wall of bladder: Secondary | ICD-10-CM | POA: Diagnosis not present

## 2022-09-11 DIAGNOSIS — Z5111 Encounter for antineoplastic chemotherapy: Secondary | ICD-10-CM | POA: Diagnosis not present

## 2022-09-14 ENCOUNTER — Ambulatory Visit (HOSPITAL_COMMUNITY): Payer: Medicare Other | Admitting: Registered Nurse

## 2022-09-14 ENCOUNTER — Ambulatory Visit (HOSPITAL_BASED_OUTPATIENT_CLINIC_OR_DEPARTMENT_OTHER): Payer: Medicare Other | Admitting: Registered Nurse

## 2022-09-14 ENCOUNTER — Other Ambulatory Visit: Payer: Self-pay

## 2022-09-14 ENCOUNTER — Encounter (HOSPITAL_COMMUNITY): Payer: Self-pay | Admitting: Internal Medicine

## 2022-09-14 ENCOUNTER — Ambulatory Visit (HOSPITAL_COMMUNITY)
Admission: RE | Admit: 2022-09-14 | Discharge: 2022-09-14 | Disposition: A | Discharge status: Home / Self Care | Payer: Medicare Other | Attending: Internal Medicine | Admitting: Internal Medicine

## 2022-09-14 ENCOUNTER — Encounter (HOSPITAL_COMMUNITY)
Admission: RE | Disposition: A | Discharge status: Home / Self Care | Payer: Self-pay | Source: Home / Self Care | Attending: Internal Medicine

## 2022-09-14 DIAGNOSIS — I4892 Unspecified atrial flutter: Secondary | ICD-10-CM | POA: Insufficient documentation

## 2022-09-14 DIAGNOSIS — F17210 Nicotine dependence, cigarettes, uncomplicated: Secondary | ICD-10-CM

## 2022-09-14 DIAGNOSIS — I4819 Other persistent atrial fibrillation: Secondary | ICD-10-CM | POA: Diagnosis not present

## 2022-09-14 DIAGNOSIS — I509 Heart failure, unspecified: Secondary | ICD-10-CM

## 2022-09-14 DIAGNOSIS — I110 Hypertensive heart disease with heart failure: Secondary | ICD-10-CM | POA: Insufficient documentation

## 2022-09-14 DIAGNOSIS — I4891 Unspecified atrial fibrillation: Secondary | ICD-10-CM | POA: Insufficient documentation

## 2022-09-14 DIAGNOSIS — D649 Anemia, unspecified: Secondary | ICD-10-CM | POA: Diagnosis not present

## 2022-09-14 DIAGNOSIS — I5032 Chronic diastolic (congestive) heart failure: Secondary | ICD-10-CM | POA: Diagnosis not present

## 2022-09-14 DIAGNOSIS — M1990 Unspecified osteoarthritis, unspecified site: Secondary | ICD-10-CM | POA: Diagnosis not present

## 2022-09-14 DIAGNOSIS — G459 Transient cerebral ischemic attack, unspecified: Secondary | ICD-10-CM | POA: Insufficient documentation

## 2022-09-14 DIAGNOSIS — F17200 Nicotine dependence, unspecified, uncomplicated: Secondary | ICD-10-CM | POA: Diagnosis not present

## 2022-09-14 DIAGNOSIS — K219 Gastro-esophageal reflux disease without esophagitis: Secondary | ICD-10-CM | POA: Insufficient documentation

## 2022-09-14 HISTORY — PX: CARDIOVERSION: SHX1299

## 2022-09-14 SURGERY — CARDIOVERSION
Anesthesia: General

## 2022-09-14 MED ORDER — SODIUM CHLORIDE 0.9 % IR SOLN: 500.0000 mL | Freq: Once | Status: DC

## 2022-09-14 MED ORDER — PROPOFOL 10 MG/ML IV BOLUS
INTRAVENOUS | Status: DC | PRN
  Administered 2022-09-14: 70 mg via INTRAVENOUS

## 2022-09-14 MED ORDER — LIDOCAINE 2% (20 MG/ML) 5 ML SYRINGE
INTRAMUSCULAR | Status: DC | PRN
  Administered 2022-09-14: 60 mg via INTRAVENOUS

## 2022-09-14 MED ORDER — SODIUM CHLORIDE 0.9 % IV SOLN: INTRAVENOUS | Status: DC

## 2022-09-14 NOTE — Anesthesia Postprocedure Evaluation (Signed)
Anesthesia Post Note  Patient: Brian [name]  Procedure(s) Performed: CARDIOVERSION     Patient location during evaluation: PACU Anesthesia Type: General Level of consciousness: awake and alert Pain management: pain level controlled Vital Signs Assessment: post-procedure vital signs reviewed and stable Respiratory status: spontaneous breathing, nonlabored ventilation, respiratory function stable and patient connected to nasal cannula oxygen Cardiovascular status: blood pressure returned to baseline and stable Postop Assessment: no apparent nausea or vomiting Anesthetic complications: no  There were no known notable events for this encounter.  Last Vitals:  Vitals:   09/14/22 0910 09/14/22 0919  BP: (!) 113/59 (!) 110/55  Pulse: 66 66  Resp: 20 (!) 25  Temp:    SpO2: 94% 95%    Last Pain:  Vitals:   09/14/22 0919  TempSrc:   PainSc: 0-No pain                 Barnet Glasgow

## 2022-09-14 NOTE — Interval H&P Note (Signed)
History and Physical Interval Note:  09/14/2022 8:01 AM  Brian [name]  has presented today for surgery, with the diagnosis of AFIB.  The various methods of treatment have been discussed with the patient and family. After consideration of risks, benefits and other options for treatment, the patient has consented to  Procedure(s): CARDIOVERSION (N/A) as a surgical intervention.  The patient's history has been reviewed, patient examined, no change in status, stable for surgery.  I have reviewed the patient's chart and labs.  Questions were answered to the patient's satisfaction.     Pape Parson A Fajr [place]

## 2022-09-14 NOTE — CV Procedure (Signed)
    Electrical Cardioversion Procedure Note [name] [id number] Dec 21, 1950  Procedure: Electrical Cardioversion Indications:  Atrial Fibrillation  Time Out: Verified patient identification, verified procedure,medications/allergies/relevent history reviewed, required imaging and test results available.  Performed  Procedure Details  The patient was NPO after midnight. Anesthesia was administered at the beside  by Dr.Houser with 60mg  of lidocaine and 70 mg propofol.  Cardioversion was done with synchronized biphasic defibrillation with AP pads with 200 Joules.  The patient converted to normal sinus rhythm. The patient tolerated the procedure well   IMPRESSION:  Successful cardioversion of atrial fibrillation    Brian [name] Brian [name] 09/14/2022, 8:53 AM

## 2022-09-14 NOTE — Transfer of Care (Signed)
Immediate Anesthesia Transfer of Care Note  Patient: Brian [name]  Procedure(s) Performed: CARDIOVERSION  Patient Location: PACU and Endoscopy Unit  Anesthesia Type:MAC  Level of Consciousness: awake and patient cooperative  Airway & Oxygen Therapy: Patient Spontanous Breathing  Post-op Assessment: Report given to RN and Post -op Vital signs reviewed and stable  Post vital signs: Reviewed and stable  Last Vitals:  Vitals Value Taken Time  BP    Temp    Pulse    Resp    SpO2      Last Pain:  Vitals:   09/14/22 0815  TempSrc: Temporal  PainSc: 0-No pain         Complications: There were no known notable events for this encounter.

## 2022-09-14 NOTE — Anesthesia Preprocedure Evaluation (Addendum)
Anesthesia Evaluation  Patient identified by MRN, date of birth, ID band Patient awake    Reviewed: Allergy & Precautions, NPO status , Patient's Chart, lab work & pertinent test results  Airway Mallampati: III  TM Distance: >3 FB Neck ROM: Full    Dental no notable dental hx. (+) Dental Advisory Given, Lower Dentures, Upper Dentures   Pulmonary shortness of breath, Current Smoker and Patient abstained from smoking.   Pulmonary exam normal breath sounds clear to auscultation       Cardiovascular hypertension, +CHF  Normal cardiovascular exam+ dysrhythmias Atrial Fibrillation  Rhythm:Irregular Rate:Abnormal  04/2022 TTE   1. Left ventricular ejection fraction, by estimation, is 50 to 55%. The  left ventricle has low normal function. The left ventricle has no regional  wall motion abnormalities. Left ventricular diastolic parameters are  indeterminate.   2. Right ventricular systolic function is normal. The right ventricular  size is normal. Tricuspid regurgitation signal is inadequate for assessing  PA pressure.   3. The mitral valve is normal in structure. No evidence of mitral valve  regurgitation. No evidence of mitral stenosis.   4. The aortic valve is normal in structure. Aortic valve regurgitation is  not visualized. No aortic stenosis is present.   5. The inferior vena cava is normal in size with greater than 50%  respiratory variability, suggesting right atrial pressure of 3 mmHg.      Neuro/Psych   Anxiety     TIA   GI/Hepatic ,GERD  ,,  Endo/Other    Renal/GU      Musculoskeletal  (+) Arthritis ,    Abdominal   Peds  Hematology  (+) Blood dyscrasia, anemia Lab Results      Component                Value               Date                         HGB                      10.8 (L)            09/08/2022                HCT                      36.7 (L)            09/08/2022                PLT                       266                 09/08/2022              Anesthesia Other Findings   Reproductive/Obstetrics                             Anesthesia Physical Anesthesia Plan  ASA: 4  Anesthesia Plan: General   Post-op Pain Management:    Induction: Intravenous  PONV Risk Score and Plan: Treatment may vary due to age or medical condition and Propofol infusion  Airway Management Planned: Mask  Additional Equipment: None  Intra-op Plan:   Post-operative Plan:   Informed Consent:  I have reviewed the patients History and Physical, chart, labs and discussed the procedure including the risks, benefits and alternatives for the proposed anesthesia with the patient or authorized representative who has indicated his/her understanding and acceptance.     Dental advisory given  Plan Discussed with:   Anesthesia Plan Comments:         Anesthesia Quick Evaluation

## 2022-09-15 ENCOUNTER — Encounter (HOSPITAL_COMMUNITY): Payer: Self-pay | Admitting: Internal Medicine

## 2022-09-18 DIAGNOSIS — C673 Malignant neoplasm of anterior wall of bladder: Secondary | ICD-10-CM | POA: Diagnosis not present

## 2022-09-18 DIAGNOSIS — Z5111 Encounter for antineoplastic chemotherapy: Secondary | ICD-10-CM | POA: Diagnosis not present

## 2022-09-25 ENCOUNTER — Ambulatory Visit (HOSPITAL_COMMUNITY)
Admission: RE | Admit: 2022-09-25 | Discharge: 2022-09-25 | Disposition: A | Discharge status: Home / Self Care | Payer: Medicare Other | Source: Ambulatory Visit | Attending: Nurse Practitioner | Admitting: Nurse Practitioner

## 2022-09-25 ENCOUNTER — Encounter (HOSPITAL_COMMUNITY): Payer: Self-pay | Admitting: Nurse Practitioner

## 2022-09-25 VITALS — BP 138/88 | HR 91 | Ht 70.0 in | Wt 256.4 lb

## 2022-09-25 DIAGNOSIS — I4892 Unspecified atrial flutter: Secondary | ICD-10-CM | POA: Diagnosis not present

## 2022-09-25 DIAGNOSIS — D649 Anemia, unspecified: Secondary | ICD-10-CM | POA: Insufficient documentation

## 2022-09-25 DIAGNOSIS — I4819 Other persistent atrial fibrillation: Secondary | ICD-10-CM | POA: Diagnosis not present

## 2022-09-25 DIAGNOSIS — Z79899 Other long term (current) drug therapy: Secondary | ICD-10-CM | POA: Diagnosis not present

## 2022-09-25 DIAGNOSIS — D6869 Other thrombophilia: Secondary | ICD-10-CM

## 2022-09-25 DIAGNOSIS — Z7901 Long term (current) use of anticoagulants: Secondary | ICD-10-CM | POA: Diagnosis not present

## 2022-09-25 DIAGNOSIS — Z8249 Family history of ischemic heart disease and other diseases of the circulatory system: Secondary | ICD-10-CM | POA: Insufficient documentation

## 2022-09-25 DIAGNOSIS — F17210 Nicotine dependence, cigarettes, uncomplicated: Secondary | ICD-10-CM | POA: Insufficient documentation

## 2022-09-25 LAB — CBC
HCT: 37.8 % — ABNORMAL LOW (ref 39.0–52.0)
Hemoglobin: 10.7 g/dL — ABNORMAL LOW (ref 13.0–17.0)
MCH: 24.4 pg — ABNORMAL LOW (ref 26.0–34.0)
MCHC: 28.3 g/dL — ABNORMAL LOW (ref 30.0–36.0)
MCV: 86.1 fL (ref 80.0–100.0)
Platelets: 327 10*3/uL (ref 150–400)
RBC: 4.39 MIL/uL (ref 4.22–5.81)
RDW: 17.5 % — ABNORMAL HIGH (ref 11.5–15.5)
WBC: 8.9 10*3/uL (ref 4.0–10.5)
nRBC: 0 % (ref 0.0–0.2)

## 2022-09-25 NOTE — Progress Notes (Addendum)
Primary Care Physician: Orpah Melter, MD Referring Physician:Dr. Mealor EP: Dr. Boneta Lucks Brian [name] is a 72 y.o. male with a h/o atrial flutter ablation in 2018 and underwent afib ablation 07/20/22. Ekg today shows rate controlled afib. He felt very well for the first 2 weeks and then noticed that he felt more short of breath in the last 2 weeks. He still feels better than prior to ablation. He was placed on amiodarone 200 mg daily at time of ablation. No swallowing or groin issues.   F/u in afib clinic, 08/27/22. He has been loading on a higher dose of amiodarone at 200 mg bid after being found in atrial flutter on last visit here with amio being started at 200 mg daily at time of ablation 1/22. EKG shows rate controlled atrial flutter with variable block.   F/u in afib clinic, 09/25/22. He had a successful cardioversion 09/14/22, but unfortunately, he is in atrial flutter  at 91 bpm. He showed anemia from 12.4 to 10.8 prior to cardioversion. He also states that he passed 12 blood clots 2 days after his BCG treatment for h/o bladder cancer and after cardioversion. CBC repeated today. He states he is tired. He continues on amiodarone at 200 mg daily.   Today, he denies symptoms of palpitations, chest pain, shortness of breath, orthopnea, PND, lower extremity edema, dizziness, presyncope, syncope, or neurologic sequela. The patient is tolerating medications without difficulties and is otherwise without complaint today.   Past Medical History:  Diagnosis Date   Anemia    none since 2019   Anxiety    Atrial flutter ([place])    a. 08/2012   CHF (congestive heart failure) (HCC)    chronic diastolic chf, pt denies   GERD (gastroesophageal reflux disease)    Gout    pt denies   History of kidney stones    passed   Hypertension    Obesity    Paroxysmal atrial fibrillation ([place])    Pneumonia 09/2016   Rheumatoid arthritis ([place])    "a little bit qwhere" (07/22/2017)   Shortness of breath     with heavy  exertion   Tobacco abuse    Past Surgical History:  Procedure Laterality Date   A-FLUTTER ABLATION N/A 12/29/2016   Procedure: A-Flutter Ablation;  Surgeon: [name], MD;  Location: [place] [facility];  Service: Cardiovascular;  Laterality: N/A;   ATRIAL FIBRILLATION ABLATION N/A 07/20/2022   Procedure: ATRIAL FIBRILLATION ABLATION;  Surgeon: Melida Quitter, MD;  Location: [place] [facility];  Service: Cardiovascular;  Laterality: N/A;   CARDIOVERSION N/A 11/05/2016   Procedure: CARDIOVERSION;  Surgeon: Sueanne Margarita, MD;  Location[facility] ENDOSCOPY;  Service: Cardiovascular;  Laterality: N/A;   CARDIOVERSION N/A 06/09/2022   Procedure: CARDIOVERSION;  Surgeon: Jerline Pain, MD;  Location: [facility] ENDOSCOPY;  Service: Cardiovascular;  Laterality: N/A;   CARDIOVERSION N/A 09/14/2022   Procedure: CARDIOVERSION;  Surgeon: Werner Lean, MD;  Location: [place];  Service: Cardiovascular;  Laterality: N/A;   IR THORACENTESIS ASP PLEURAL SPACE W/IMG GUIDE  10/01/2016   JOINT REPLACEMENT     Right hip and left knee   MICROLARYNGOSCOPY  09/02/2007   with excision of right vocal cord mass, Dr. Constance Holster   PROSTATE BIOPSY N/A 02/22/2020   Procedure: BIOPSY TRANSRECTAL ULTRASONIC PROSTATE (TUBP);  Surgeon: Irine Seal, MD;  Location[facility];  Service: Urology;  Laterality: N/A;   TOTAL HIP ARTHROPLASTY Right 11/17/2017   Procedure: RIGHT  TOTAL HIP ARTHROPLASTY ANTERIOR APPROACH;  Surgeon: Leandrew Koyanagi, MD;  Location: [place];  Service: Orthopedics;  Laterality: Right;   TOTAL HIP ARTHROPLASTY Left 03/07/2018   Procedure: LEFT TOTAL HIP ARTHROPLASTY ANTERIOR APPROACH;  Surgeon: Leandrew Koyanagi, MD;  Location: [place];  Service: Orthopedics;  Laterality: Left;   TOTAL KNEE ARTHROPLASTY Left 07/22/2017   Procedure: LEFT TOTAL KNEE ARTHROPLASTY;  Surgeon: Leandrew Koyanagi, MD;  Location: [place];  Service: Orthopedics;  Laterality: Left;   TRANSURETHRAL RESECTION OF BLADDER  TUMOR N/A 04/02/2020   Procedure: RESTAGING TRANSURETHRAL RESECTION OF BLADDER TUMOR (TURBT);  Surgeon: Irine Seal, MD;  Location: [facility];  Service: Urology;  Laterality: N/A;  GENERAL ANESTHESIA WIHT PARALYSIS   TRANSURETHRAL RESECTION OF BLADDER TUMOR WITH MITOMYCIN-C N/A 02/22/2020   Procedure: TRANSURETHRAL RESECTION OF BLADDER TUMOR WITH   GEMCITABINE;  Surgeon: Irine Seal, MD;  Location: [facility];  Service: Urology;  Laterality: N/A;    Current Outpatient Medications  Medication Sig Dispense Refill   amiodarone (PACERONE) 200 MG tablet Take 1 tablet (200 mg total) by mouth daily. 30 tablet 3   aspirin-acetaminophen-caffeine (EXCEDRIN EXTRA STRENGTH) 250-250-65 MG tablet Take 1 tablet by mouth every 6 (six) hours as needed for headache.     cephALEXin (KEFLEX) 250 MG capsule Take 250 mg by mouth at bedtime.     diltiazem (CARDIZEM CD) 120 MG 24 hr capsule Take 1 capsule (120 mg total) by mouth daily. 90 capsule 3   folic acid (FOLVITE) 1 MG tablet Take 1 mg by mouth daily.     furosemide (LASIX) 40 MG tablet Take 1 tablet (40 mg total) by mouth daily. 90 tablet 3   golimumab (SIMPONI ARIA) 50 MG/4ML SOLN injection Inject 50 mg into the vein every 8 (eight) weeks.     hydroxychloroquine (PLAQUENIL) 200 MG tablet Take 200 mg by mouth daily.     KLOR-CON M20 20 MEQ tablet TAKE 1 TABLET BY MOUTH EVERY DAY 90 tablet 3   methotrexate (RHEUMATREX) 2.5 MG tablet Take 25 mg by mouth every Friday. 10 tablets 1 time a week on Fridays in the evening  1   rivaroxaban (XARELTO) 20 MG TABS tablet Take 1 tablet (20 mg total) by mouth daily with supper. 30 tablet 6   tamsulosin (FLOMAX) 0.4 MG CAPS capsule Take 0.4 mg by mouth at bedtime.     pantoprazole (PROTONIX) 40 MG tablet Take 1 tablet (40 mg total) by mouth daily. (Patient not taking: Reported on 09/09/2022) 30 tablet 0   No current facility-administered medications for this encounter.    Allergies  Allergen  Reactions   Aquacel-Ag Surgical Hydrofiber [Wound Dressings] Other (See Comments)    Tears skin   Tape Rash and Other (See Comments)    Paper tape is preferred, please!! Adhesive tape tears skin    Social History   Socioeconomic History   Marital status: Divorced    Spouse name: Not on file   Number of children: 3   Years of education: Not on file   Highest education level: Not on file  Occupational History   Not on file  Tobacco Use   Smoking status: Every Day    Packs/day: 0.13    Years: 45.00    Additional pack years: 0.00    Total pack years: 5.85    Types: Cigarettes   Smokeless tobacco: Never   Tobacco comments:    1/2ppd 09/25/22  Vaping Use   Vaping Use: Never  used  Substance and Sexual Activity   Alcohol use: Yes    Comment: 07/22/2017 "nothing since 2016"   Drug use: No    Comment: 07/22/2017 "none since 11/2016" cocaine (sister is not aware)   Sexual activity: Not Currently  Other Topics Concern   Not on file  Social History Narrative   Lives in [place]    Works at a convenience store   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   Divorced with 3 children and 9 grandchildren, he states ex-wife is still a friend   Investment banker, operational of Sales executive: Not on file  Food Insecurity: Not on file  Transportation Needs: Not on file  Physical Activity: Not on file  Stress: Not on file  Social Connections: Not on file  Intimate Partner Violence: Not on file    Family History  Problem Relation Age of Onset   Cancer Mother    Heart disease Father    Heart attack Father    Cancer Father    Diabetes Sister     ROS- All systems are reviewed and negative except as per the HPI above  Physical Exam: There were no vitals filed for this visit.  Wt Readings from Last 3 Encounters:  08/27/22 116.9 kg  08/18/22 116.5 kg  07/20/22 117.5 kg    Labs: Lab Results  Component Value Date   NA 137 09/08/2022   K 4.5 09/08/2022   CL 100 09/08/2022    CO2 23 09/08/2022   GLUCOSE 133 (H) 09/08/2022   BUN 10 09/08/2022   CREATININE 0.73 09/08/2022   CALCIUM 8.7 (L) 09/08/2022   PHOS 2.5 10/01/2016   MG 2.1 04/29/2022   Lab Results  Component Value Date   INR 0.97 03/07/2018   Lab Results  Component Value Date   CHOL 148 02/21/2020   HDL 45 02/21/2020   LDLCALC 79 02/21/2020   TRIG 133 02/21/2020     GEN- The patient is well appearing, alert and oriented x 3 today.   Head- normocephalic, atraumatic Eyes-  Sclera clear, conjunctiva pink Ears- hearing intact Oropharynx- clear Neck- supple, no JVP Lymph- no cervical lymphadenopathy Lungs- Clear to ausculation bilaterally, normal work of breathing Heart- irregular rate and rhythm, no murmurs, rubs or gallops, PMI not laterally displaced GI- soft, NT, ND, + BS Extremities- no clubbing, cyanosis, or edema MS- no significant deformity or atrophy Skin- no rash or lesion Psych- euthymic mood, full affect Neuro- strength and sensation are intact  EKG-Vent. rate 91 BPM PR interval * ms QRS duration 90 ms QT/QTcB 390/479 ms P-R-T axes 82 84 70 Atrial flutter with variable A-V block Low voltage QRS Abnormal ECG When compared with ECG of 14-Sep-2022 09:00, PREVIOUS ECG IS PRESENT   Assessment and Plan:  1. Afib/flutter  S/p ablation 07/20/22 Successful cardioversion but has returned to atrial flutter, rate controlled,  despite increase of amiodarone to 200 mg bid  Now on amiodarone 200 mg qd, rate controlled, symptomatic with fatigue but also anemic   Will move up appointment with Dr. Myles Gip since failing amiodarone  Continue diltiazem at 120 mg daily  2. CHA2DS2VASc  score of at least 4 Continue xarelto 20 mg daily   3. HTN Stable   4. Anemia Drop in H/H to 10.8/36.7 from 12.4/40.5 (2) months ago with pt reporting hematuria with clots last Tuesday Repeat CBC  Appointment requested with Dr. Berlinda Last C. Malayshia All, [facility] [address]  [place], [place] [address] [phone]

## 2022-09-27 ENCOUNTER — Other Ambulatory Visit: Payer: Self-pay | Admitting: Cardiology

## 2022-09-27 DIAGNOSIS — I4892 Unspecified atrial flutter: Secondary | ICD-10-CM

## 2022-09-28 NOTE — Telephone Encounter (Signed)
Xarelto 20mg  refill request received. Pt is 72 years old, weight-116.3kg, Crea-0.73 on 09/08/22, last seen by Roderic Palau on 09/25/22, Diagnosis-Aflutter, CrCl- 152.68 mL/min; Dose is appropriate based on dosing criteria. Will send in refill to requested pharmacy.

## 2022-10-05 DIAGNOSIS — Z79899 Other long term (current) drug therapy: Secondary | ICD-10-CM | POA: Diagnosis not present

## 2022-10-05 DIAGNOSIS — R5383 Other fatigue: Secondary | ICD-10-CM | POA: Diagnosis not present

## 2022-10-05 DIAGNOSIS — M0579 Rheumatoid arthritis with rheumatoid factor of multiple sites without organ or systems involvement: Secondary | ICD-10-CM | POA: Diagnosis not present

## 2022-10-09 ENCOUNTER — Ambulatory Visit: Payer: Medicare Other | Attending: Cardiovascular Disease | Admitting: Cardiovascular Disease

## 2022-10-09 ENCOUNTER — Encounter: Payer: Self-pay | Admitting: Cardiovascular Disease

## 2022-10-09 VITALS — BP 132/70 | HR 72 | Ht 69.0 in | Wt 260.2 lb

## 2022-10-09 DIAGNOSIS — Z79899 Other long term (current) drug therapy: Secondary | ICD-10-CM | POA: Diagnosis not present

## 2022-10-09 DIAGNOSIS — I4819 Other persistent atrial fibrillation: Secondary | ICD-10-CM | POA: Diagnosis not present

## 2022-10-09 NOTE — Progress Notes (Signed)
Electrophysiology Office Note:    Date:  10/09/2022   ID:  Brian [name], DOB [birth year], MRN [id number]  PCP:  Joycelyn Rua, MD   [facility] Providers Cardiologist:  Donato Schultz, MD Electrophysiologist:  Maurice Small, MD     Referring MD: Joycelyn Rua, MD     History of Present Illness:    Brian [name] is a 72 y.o. male with a hx of PAF, atrial flutter, nephrolithiasis, HTN, cocaine use, tobacco use, HF with recovered EF referred for rhythm management.  He developed CHFpEF in the setting of atrial flutter that resolved with flutter ablation in 2018. He developed atrial fibrillation related to sepsis. He appears to have remained in sinus rhythm until follow-up in July, 2023. Cardioversion was deferred due to interruption of anticoagulation for serial urologic procedures regarding his bladder cancer.  He has been reluctant to take anticoagulation in the past.                                                                                                                                                   He underwent A-fib ablation in January 2024.  There was significant esophageal heating in the right inferior vein.  This limited ablation in this region.  The patient had multiple recurrences of atrial fibrillation during the procedure and received 2 cardioversions.  Since the ablation, he has had recurrence of arrhythmia with an atrial flutter and atrial fibrillation.  He was placed on amiodarone and continues amiodarone at present.  He has also had issues with anemia and feels fatigued.  Today he felt very fatigued coming to clinic from the parking lot.  His rhythm is normal sinus.   Past Medical History:  Diagnosis Date   Anemia    none since 2019   Anxiety    Atrial flutter    a. 08/2012   CHF (congestive heart failure)    chronic diastolic chf, pt denies   GERD (gastroesophageal reflux disease)    Gout    pt denies   History of kidney stones     passed   Hypertension    Obesity    Paroxysmal atrial fibrillation    Pneumonia 09/2016   Rheumatoid arthritis    "a little bit qwhere" (07/22/2017)   Shortness of breath    with heavy  exertion   Tobacco abuse     Past Surgical History:  Procedure Laterality Date   A-FLUTTER ABLATION N/A 12/29/2016   Procedure: A-Flutter Ablation;  Surgeon: Hillis Range, MD;  Location: [facility];  Service: Cardiovascular;  Laterality: N/A;   ATRIAL FIBRILLATION ABLATION N/A 07/20/2022   Procedure: ATRIAL FIBRILLATION ABLATION;  Surgeon: Maurice Small, MD;  Location: [facility];  Service: Cardiovascular;  Laterality: N/A;   CARDIOVERSION N/A 11/05/2016   Procedure: CARDIOVERSION;  Surgeon: Armanda Magic  R, MD;  Location: [facility];  Service: Cardiovascular;  Laterality: N/A;   CARDIOVERSION N/A 06/09/2022   Procedure: CARDIOVERSION;  Surgeon: Jake Bathe, MD;  Location: [facility] ENDOSCOPY;  Service: Cardiovascular;  Laterality: N/A;   CARDIOVERSION N/A 09/14/2022   Procedure: CARDIOVERSION;  Surgeon: Christell Constant, MD;  Location: [facility];  Service: Cardiovascular;  Laterality: N/A;   IR THORACENTESIS ASP PLEURAL SPACE W/IMG GUIDE  10/01/2016   JOINT REPLACEMENT     Right hip and left knee   MICROLARYNGOSCOPY  09/02/2007   with excision of right vocal cord mass, Dr. Pollyann Kennedy   PROSTATE BIOPSY N/A 02/22/2020   Procedure: BIOPSY TRANSRECTAL ULTRASONIC PROSTATE (TUBP);  Surgeon: Bjorn Pippin, MD;  Location: [facility];  Service: Urology;  Laterality: N/A;   TOTAL HIP ARTHROPLASTY Right 11/17/2017   Procedure: RIGHT TOTAL HIP ARTHROPLASTY ANTERIOR APPROACH;  Surgeon: Tarry Kos, MD;  [facility];  Service: Orthopedics;  Laterality: Right;   TOTAL HIP ARTHROPLASTY Left 03/07/2018   Procedure: LEFT TOTAL HIP ARTHROPLASTY ANTERIOR APPROACH;  Surgeon: Tarry Kos, MD;  Location: [facility];  Service: Orthopedics;  Laterality: Left;   TOTAL KNEE ARTHROPLASTY Left  07/22/2017   Procedure: LEFT TOTAL KNEE ARTHROPLASTY;  Surgeon: Tarry Kos, MD;  Location: [facility];  Service: Orthopedics;  Laterality: Left;   TRANSURETHRAL RESECTION OF BLADDER TUMOR N/A 04/02/2020   Procedure: RESTAGING TRANSURETHRAL RESECTION OF BLADDER TUMOR (TURBT);  Surgeon: Bjorn Pippin, MD;  Location[facility];  Service: Urology;  Laterality: N/A;  GENERAL ANESTHESIA WIHT PARALYSIS   TRANSURETHRAL RESECTION OF BLADDER TUMOR WITH MITOMYCIN-C N/A 02/22/2020   Procedure: TRANSURETHRAL RESECTION OF BLADDER TUMOR WITH   GEMCITABINE;  Surgeon: Bjorn Pippin, MD;  Location: [facility];  Service: Urology;  Laterality: N/A;    Current Medications: Current Meds  Medication Sig   amiodarone (PACERONE) 200 MG tablet Take 1 tablet (200 mg total) by mouth daily.   aspirin-acetaminophen-caffeine (EXCEDRIN EXTRA STRENGTH) 250-250-65 MG tablet Take 1 tablet by mouth every 6 (six) hours as needed for headache.   cephALEXin (KEFLEX) 250 MG capsule Take 250 mg by mouth at bedtime.   diltiazem (CARDIZEM CD) 120 MG 24 hr capsule Take 1 capsule (120 mg total) by mouth daily.   folic acid (FOLVITE) 1 MG tablet Take 1 mg by mouth daily.   furosemide (LASIX) 40 MG tablet Take 1 tablet (40 mg total) by mouth daily.   golimumab (SIMPONI ARIA) 50 MG/4ML SOLN injection Inject 50 mg into the vein every 8 (eight) weeks.   hydroxychloroquine (PLAQUENIL) 200 MG tablet Take 200 mg by mouth daily.   KLOR-CON M20 20 MEQ tablet TAKE 1 TABLET BY MOUTH EVERY DAY   methotrexate (RHEUMATREX) 2.5 MG tablet Take 25 mg by mouth every Friday. 10 tablets 1 time a week on Fridays in the evening   pantoprazole (PROTONIX) 40 MG tablet Take 1 tablet (40 mg total) by mouth daily.   rivaroxaban (XARELTO) 20 MG TABS tablet TAKE 1 TABLET BY MOUTH DAILY WITH SUPPER.   tamsulosin (FLOMAX) 0.4 MG CAPS capsule Take 0.4 mg by mouth at bedtime.     Allergies:   Aquacel-ag surgical hydrofiber [wound dressings] and  Tape   Social History   Socioeconomic History   Marital status: Divorced    Spouse name: Not on file   Number of children: 3   Years of education: Not on file   Highest education level: Not on file  Occupational History  Not on file  Tobacco Use   Smoking status: Every Day    Packs/day: 0.13    Years: 45.00    Additional pack years: 0.00    Total pack years: 5.85    Types: Cigarettes   Smokeless tobacco: Never   Tobacco comments:    1/2ppd 09/25/22  Vaping Use   Vaping Use: Never used  Substance and Sexual Activity   Alcohol use: Yes    Comment: 07/22/2017 "nothing since 2016"   Drug use: No    Comment: 07/22/2017 "none since 11/2016" cocaine (sister is not aware)   Sexual activity: Not Currently  Other Topics Concern   Not on file  Social History Narrative   Lives in [place]    Works at a convenience store   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   Divorced with 3 children and 9 grandchildren, he states ex-wife is still a friend   International aid/development worker of Community education officer: Not on file  Food Insecurity: Not on file  Transportation Needs: Not on file  Physical Activity: Not on file  Stress: Not on file  Social Connections: Not on file     Family History: The patient's family history includes Cancer in his father and mother; Diabetes in his sister; Heart attack in his father; Heart disease in his father.  ROS:   Please see the history of present illness.    All other systems reviewed and are negative.  EKGs/Labs/Other Studies Reviewed Today:    TTE 04/2022  1. Left ventricular ejection fraction, by estimation, is 50 to 55%. The  left ventricle has low normal function. The left ventricle has no regional  wall motion abnormalities. Left ventricular diastolic parameters are  indeterminate.   2. Right ventricular systolic function is normal. The right ventricular  size is normal. Tricuspid regurgitation signal is inadequate for assessing  PA pressure.    3. The mitral valve is normal in structure. No evidence of mitral valve  regurgitation. No evidence of mitral stenosis.   4. The aortic valve is normal in structure. Aortic valve regurgitation is  not visualized. No aortic stenosis is present.   5. The inferior vena cava is normal in size with greater than 50%  respiratory variability, suggesting right atrial pressure of 3 mmHg.   EKG:  Last EKG results: today -- sinus rhythm with 1st degree AV block   Recent Labs: 04/29/2022: ALT 27; Magnesium 2.1; NT-Pro BNP 136; TSH 1.690 09/08/2022: BUN 10; Creatinine, Ser 0.73; Potassium 4.5; Sodium 137 09/25/2022: Hemoglobin 10.7; Platelets 327     Physical Exam:    VS:  BP 132/70   Pulse 72   Ht 5\' 9"  (1.753 m)   Wt 260 lb 3.2 oz (118 kg)   SpO2 92%   BMI 38.42 kg/m     Wt Readings from Last 3 Encounters:  10/09/22 260 lb 3.2 oz (118 kg)  09/25/22 256 lb 6.4 oz (116.3 kg)  08/27/22 257 lb 12.8 oz (116.9 kg)     GEN:  Well nourished, well developed in no acute distress CARDIAC: Irregular rhythm with normal rate, no murmurs, rubs, gallops RESPIRATORY:  Normal work of breathing MUSCULOSKELETAL: trace edema    ASSESSMENT & PLAN:    Persistent atrial fibrillation:  symptomatic with NYHA II-III symptoms.   He has had recurrence after ablation, also has had flutter, likely atypical -- both during the blinding period. Continue amiodarone for now. I will likely DC this in close follow-up I am going  to go ahead and book him for a repeat ablation/EP study since the next available date is September. We will follow-up in late summer and cancel the procedure if he does not have any recurrence in the interim  Fatigue Significant fatigue today despite being in normal sinus rhythm: Will check CBC in setting of recent anemia; TSH and hepatic panel on amiodarone  Atrial flutter: s/p ablation  History of CHFpEF: I think he will do much better in sinus rhythm.        Medication  Adjustments/Labs and Tests Ordered: Current medicines are reviewed at length with the patient today.  Concerns regarding medicines are outlined above.  No orders of the defined types were placed in this encounter.  No orders of the defined types were placed in this encounter.    Signed, Maurice Small, MD  10/09/2022 3:36 PM    [facility]

## 2022-10-09 NOTE — Patient Instructions (Signed)
Medication Instructions:  Your physician recommends that you continue on your current medications as directed. Please refer to the Current Medication list given to you today. *If you need a refill on your cardiac medications before your next appointment, please call your pharmacy*   Lab Work: CBC and TSH TODAY If you have labs (blood work) drawn today and your tests are completely normal, you will receive your results only by: MyChart Message (if you have MyChart) OR A paper copy in the mail If you have any lab test that is abnormal or we need to change your treatment, we will call you to review the results.   Testing/Procedures: Ablation Your physician has recommended that you have an ablation. Catheter ablation is a medical procedure used to treat some cardiac arrhythmias (irregular heartbeats). During catheter ablation, a long, thin, flexible tube is put into a blood vessel in your groin (upper thigh), or neck. This tube is called an ablation catheter. It is then guided to your heart through the blood vessel. Radio frequency waves destroy small areas of heart tissue where abnormal heartbeats may cause an arrhythmia to start. Please see the instruction sheet given to you today.  You are scheduled for Atrial Fibrillation Ablation on Monday, September 16 with Dr. Halford Chessman.Please arrive at the Main Entrance A at [facility]: [address] [place], [place] [address] at 8:30 AM     Follow-Up: At [facility], you and your health needs are our priority.  As part of our continuing mission to provide you with exceptional heart care, we have created designated Provider Care Teams.  These Care Teams include your primary Cardiologist (physician) and Advanced Practice Providers (APPs -  Physician Assistants and Nurse Practitioners) who all work together to provide you with the care you need, when you need it.  We recommend signing up for the patient portal called "MyChart".  Sign up  information is provided on this After Visit Summary.  MyChart is used to connect with patients for Virtual Visits (Telemedicine).  Patients are able to view lab/test results, encounter notes, upcoming appointments, etc.  Non-urgent messages can be sent to your provider as well.   To learn more about what you can do with MyChart, go to [email or web address].    Your next appointment:   1 month(s)  Provider:   York Pellant, MD

## 2022-10-10 LAB — CBC
Hematocrit: 28 % — ABNORMAL LOW (ref 37.5–51.0)
Hemoglobin: 8.4 g/dL — ABNORMAL LOW (ref 13.0–17.7)
MCH: 24.6 pg — ABNORMAL LOW (ref 26.6–33.0)
MCHC: 30 g/dL — ABNORMAL LOW (ref 31.5–35.7)
MCV: 82 fL (ref 79–97)
Platelets: 300 10*3/uL (ref 150–450)
RBC: 3.41 x10E6/uL — ABNORMAL LOW (ref 4.14–5.80)
RDW: 17.1 % — ABNORMAL HIGH (ref 11.6–15.4)
WBC: 9.8 10*3/uL (ref 3.4–10.8)

## 2022-10-10 LAB — HEPATIC FUNCTION PANEL
ALT: 23 IU/L (ref 0–44)
AST: 17 IU/L (ref 0–40)
Albumin: 3.9 g/dL (ref 3.8–4.8)
Alkaline Phosphatase: 81 IU/L (ref 44–121)
Bilirubin Total: 0.3 mg/dL (ref 0.0–1.2)
Bilirubin, Direct: 0.12 mg/dL (ref 0.00–0.40)
Total Protein: 6.9 g/dL (ref 6.0–8.5)

## 2022-10-10 LAB — TSH: TSH: 0.945 u[IU]/mL (ref 0.450–4.500)

## 2022-10-12 ENCOUNTER — Other Ambulatory Visit: Payer: Self-pay

## 2022-10-12 ENCOUNTER — Telehealth: Payer: Self-pay

## 2022-10-12 DIAGNOSIS — I4891 Unspecified atrial fibrillation: Secondary | ICD-10-CM

## 2022-10-12 DIAGNOSIS — Z01810 Encounter for preprocedural cardiovascular examination: Secondary | ICD-10-CM

## 2022-10-12 NOTE — Telephone Encounter (Signed)
-----   Message from Maurice Small, MD sent at 10/11/2022 10:25 AM EDT ----- Please let him know that his hemoglobin has been trending down -- it's 8.4 now. We should refer him to [facility] for [place]. If he is actively having bleeding, he'll need to hold anticoagulation.    ----- Message ----- From: Interface, [facility] Lab Results In Sent: 10/10/2022   3:41 AM EDT To: Maurice Small, MD

## 2022-10-12 NOTE — Telephone Encounter (Signed)
Spoke with patient about lab results and the drop in his Hgb. Patient states he is NOT currently bleeding but had an "episode" last week (prior to blood work, around 10/07/22 per patient) due to his hemorrhoids and constipation. Instructed him to follow up with his PCP as soon as possible (doesn't have a GI provider) to be treated for his hemorrhoids/constipation moving forward. Patient has had a more normal bowel movement since this episode without bleeding. Spoke with patient about referral for a possible watchman, agrees that would be in his best interest and wants to move forward. Patient made aware of bleeding risk with blood thinners and when to seek emergent medical care. No further questions or concerns at this time.

## 2022-10-15 ENCOUNTER — Inpatient Hospital Stay (HOSPITAL_COMMUNITY)
Admission: EM | Admit: 2022-10-15 | Discharge: 2022-10-17 | DRG: 189 | Disposition: A | Discharge status: Home / Self Care | Payer: Medicare Other | Attending: Internal Medicine | Admitting: Internal Medicine

## 2022-10-15 ENCOUNTER — Other Ambulatory Visit: Payer: Self-pay

## 2022-10-15 ENCOUNTER — Encounter (HOSPITAL_COMMUNITY): Payer: Self-pay | Admitting: Emergency Medicine

## 2022-10-15 ENCOUNTER — Emergency Department (HOSPITAL_COMMUNITY): Payer: Medicare Other

## 2022-10-15 DIAGNOSIS — Z87442 Personal history of urinary calculi: Secondary | ICD-10-CM

## 2022-10-15 DIAGNOSIS — F17219 Nicotine dependence, cigarettes, with unspecified nicotine-induced disorders: Secondary | ICD-10-CM | POA: Diagnosis not present

## 2022-10-15 DIAGNOSIS — Z833 Family history of diabetes mellitus: Secondary | ICD-10-CM

## 2022-10-15 DIAGNOSIS — M069 Rheumatoid arthritis, unspecified: Secondary | ICD-10-CM | POA: Diagnosis not present

## 2022-10-15 DIAGNOSIS — F419 Anxiety disorder, unspecified: Secondary | ICD-10-CM | POA: Diagnosis present

## 2022-10-15 DIAGNOSIS — K621 Rectal polyp: Secondary | ICD-10-CM | POA: Diagnosis not present

## 2022-10-15 DIAGNOSIS — J9601 Acute respiratory failure with hypoxia: Principal | ICD-10-CM | POA: Diagnosis present

## 2022-10-15 DIAGNOSIS — Z8249 Family history of ischemic heart disease and other diseases of the circulatory system: Secondary | ICD-10-CM

## 2022-10-15 DIAGNOSIS — Z96652 Presence of left artificial knee joint: Secondary | ICD-10-CM | POA: Diagnosis present

## 2022-10-15 DIAGNOSIS — K219 Gastro-esophageal reflux disease without esophagitis: Secondary | ICD-10-CM | POA: Diagnosis present

## 2022-10-15 DIAGNOSIS — I10 Essential (primary) hypertension: Secondary | ICD-10-CM | POA: Diagnosis not present

## 2022-10-15 DIAGNOSIS — I272 Pulmonary hypertension, unspecified: Secondary | ICD-10-CM | POA: Diagnosis not present

## 2022-10-15 DIAGNOSIS — Z79899 Other long term (current) drug therapy: Secondary | ICD-10-CM | POA: Diagnosis not present

## 2022-10-15 DIAGNOSIS — I110 Hypertensive heart disease with heart failure: Secondary | ICD-10-CM | POA: Diagnosis present

## 2022-10-15 DIAGNOSIS — J441 Chronic obstructive pulmonary disease with (acute) exacerbation: Secondary | ICD-10-CM | POA: Diagnosis present

## 2022-10-15 DIAGNOSIS — D649 Anemia, unspecified: Secondary | ICD-10-CM | POA: Diagnosis not present

## 2022-10-15 DIAGNOSIS — E669 Obesity, unspecified: Secondary | ICD-10-CM | POA: Diagnosis present

## 2022-10-15 DIAGNOSIS — D509 Iron deficiency anemia, unspecified: Secondary | ICD-10-CM | POA: Diagnosis present

## 2022-10-15 DIAGNOSIS — J40 Bronchitis, not specified as acute or chronic: Principal | ICD-10-CM

## 2022-10-15 DIAGNOSIS — F17200 Nicotine dependence, unspecified, uncomplicated: Secondary | ICD-10-CM | POA: Diagnosis present

## 2022-10-15 DIAGNOSIS — J9691 Respiratory failure, unspecified with hypoxia: Secondary | ICD-10-CM | POA: Diagnosis not present

## 2022-10-15 DIAGNOSIS — I5032 Chronic diastolic (congestive) heart failure: Secondary | ICD-10-CM | POA: Diagnosis present

## 2022-10-15 DIAGNOSIS — F17210 Nicotine dependence, cigarettes, uncomplicated: Secondary | ICD-10-CM | POA: Diagnosis present

## 2022-10-15 DIAGNOSIS — R195 Other fecal abnormalities: Secondary | ICD-10-CM | POA: Diagnosis present

## 2022-10-15 DIAGNOSIS — R0902 Hypoxemia: Secondary | ICD-10-CM

## 2022-10-15 DIAGNOSIS — K2970 Gastritis, unspecified, without bleeding: Secondary | ICD-10-CM | POA: Diagnosis present

## 2022-10-15 DIAGNOSIS — Z1152 Encounter for screening for COVID-19: Secondary | ICD-10-CM

## 2022-10-15 DIAGNOSIS — J440 Chronic obstructive pulmonary disease with acute lower respiratory infection: Secondary | ICD-10-CM | POA: Diagnosis present

## 2022-10-15 DIAGNOSIS — Z8551 Personal history of malignant neoplasm of bladder: Secondary | ICD-10-CM

## 2022-10-15 DIAGNOSIS — I480 Paroxysmal atrial fibrillation: Secondary | ICD-10-CM | POA: Diagnosis present

## 2022-10-15 DIAGNOSIS — Z7901 Long term (current) use of anticoagulants: Secondary | ICD-10-CM

## 2022-10-15 DIAGNOSIS — D638 Anemia in other chronic diseases classified elsewhere: Secondary | ICD-10-CM | POA: Diagnosis present

## 2022-10-15 DIAGNOSIS — R079 Chest pain, unspecified: Secondary | ICD-10-CM | POA: Diagnosis not present

## 2022-10-15 DIAGNOSIS — Z6838 Body mass index (BMI) 38.0-38.9, adult: Secondary | ICD-10-CM

## 2022-10-15 DIAGNOSIS — Z720 Tobacco use: Secondary | ICD-10-CM | POA: Diagnosis not present

## 2022-10-15 DIAGNOSIS — I700 Atherosclerosis of aorta: Secondary | ICD-10-CM | POA: Diagnosis not present

## 2022-10-15 DIAGNOSIS — Z96643 Presence of artificial hip joint, bilateral: Secondary | ICD-10-CM | POA: Diagnosis present

## 2022-10-15 DIAGNOSIS — J209 Acute bronchitis, unspecified: Secondary | ICD-10-CM | POA: Insufficient documentation

## 2022-10-15 DIAGNOSIS — Z9221 Personal history of antineoplastic chemotherapy: Secondary | ICD-10-CM

## 2022-10-15 DIAGNOSIS — Z86010 Personal history of colonic polyps: Secondary | ICD-10-CM

## 2022-10-15 LAB — RESP PANEL BY RT-PCR (RSV, FLU A&B, COVID)  RVPGX2
Influenza A by PCR: NEGATIVE
Influenza B by PCR: NEGATIVE
Resp Syncytial Virus by PCR: NEGATIVE
SARS Coronavirus 2 by RT PCR: NEGATIVE

## 2022-10-15 LAB — CBC WITH DIFFERENTIAL/PLATELET
Abs Immature Granulocytes: 0.3 10*3/uL — ABNORMAL HIGH (ref 0.00–0.07)
Basophils Absolute: 0.1 10*3/uL (ref 0.0–0.1)
Basophils Relative: 1 %
Eosinophils Absolute: 0.2 10*3/uL (ref 0.0–0.5)
Eosinophils Relative: 4 %
HCT: 30.1 % — ABNORMAL LOW (ref 39.0–52.0)
Hemoglobin: 8.5 g/dL — ABNORMAL LOW (ref 13.0–17.0)
Immature Granulocytes: 5 %
Lymphocytes Relative: 16 %
Lymphs Abs: 1 10*3/uL (ref 0.7–4.0)
MCH: 24 pg — ABNORMAL LOW (ref 26.0–34.0)
MCHC: 28.2 g/dL — ABNORMAL LOW (ref 30.0–36.0)
MCV: 85 fL (ref 80.0–100.0)
Monocytes Absolute: 0.8 10*3/uL (ref 0.1–1.0)
Monocytes Relative: 14 %
Neutro Abs: 3.7 10*3/uL (ref 1.7–7.7)
Neutrophils Relative %: 60 %
Platelets: 250 10*3/uL (ref 150–400)
RBC: 3.54 MIL/uL — ABNORMAL LOW (ref 4.22–5.81)
RDW: 18.1 % — ABNORMAL HIGH (ref 11.5–15.5)
WBC: 6 10*3/uL (ref 4.0–10.5)
nRBC: 0.3 % — ABNORMAL HIGH (ref 0.0–0.2)

## 2022-10-15 LAB — BASIC METABOLIC PANEL
Anion gap: 9 (ref 5–15)
BUN: 12 mg/dL (ref 8–23)
CO2: 29 mmol/L (ref 22–32)
Calcium: 8.4 mg/dL — ABNORMAL LOW (ref 8.9–10.3)
Chloride: 97 mmol/L — ABNORMAL LOW (ref 98–111)
Creatinine, Ser: 0.76 mg/dL (ref 0.61–1.24)
GFR, Estimated: >60 mL/min (ref >60)
Glucose, Bld: 131 mg/dL — ABNORMAL HIGH (ref 70–99)
Potassium: 4.5 mmol/L (ref 3.5–5.1)
Sodium: 135 mmol/L (ref 135–145)

## 2022-10-15 LAB — BRAIN NATRIURETIC PEPTIDE: B Natriuretic Peptide: 321.4 pg/mL — ABNORMAL HIGH (ref 0.0–100.0)

## 2022-10-15 LAB — TROPONIN I (HIGH SENSITIVITY)
Troponin I (High Sensitivity): 11 ng/L (ref <18)
Troponin I (High Sensitivity): 9 ng/L (ref <18)

## 2022-10-15 MED ORDER — SODIUM CHLORIDE 0.9 % IV SOLN
500.0000 mg | Freq: Once | INTRAVENOUS | Status: AC
  Administered 2022-10-15: 500 mg via INTRAVENOUS
  Filled 2022-10-15: qty 5

## 2022-10-15 MED ORDER — IPRATROPIUM-ALBUTEROL 0.5-2.5 (3) MG/3ML IN SOLN
3.0000 mL | Freq: Once | RESPIRATORY_TRACT | Status: AC
  Administered 2022-10-15: 3 mL via RESPIRATORY_TRACT
  Filled 2022-10-15: qty 3

## 2022-10-15 MED ORDER — ONDANSETRON HCL 4 MG/2ML IJ SOLN: 4.0000 mg | Freq: Four times a day (QID) | INTRAMUSCULAR | Status: DC | PRN

## 2022-10-15 MED ORDER — MELATONIN 3 MG PO TABS: 3.0000 mg | ORAL_TABLET | Freq: Every evening | ORAL | Status: DC | PRN

## 2022-10-15 MED ORDER — IPRATROPIUM BROMIDE 0.02 % IN SOLN
0.5000 mg | Freq: Four times a day (QID) | RESPIRATORY_TRACT | Status: DC
  Administered 2022-10-16 – 2022-10-17 (×6): 0.5 mg via RESPIRATORY_TRACT
  Filled 2022-10-15 (×6): qty 2.5

## 2022-10-15 MED ORDER — SODIUM CHLORIDE 0.9 % IV SOLN
1.0000 g | Freq: Once | INTRAVENOUS | Status: AC
  Administered 2022-10-15: 1 g via INTRAVENOUS
  Filled 2022-10-15: qty 10

## 2022-10-15 MED ORDER — METHYLPREDNISOLONE SODIUM SUCC 125 MG IJ SOLR
80.0000 mg | Freq: Two times a day (BID) | INTRAMUSCULAR | Status: DC
  Administered 2022-10-16 – 2022-10-17 (×3): 80 mg via INTRAVENOUS
  Filled 2022-10-15 (×3): qty 2

## 2022-10-15 MED ORDER — LEVALBUTEROL HCL 1.25 MG/0.5ML IN NEBU
1.2500 mg | INHALATION_SOLUTION | RESPIRATORY_TRACT | Status: DC | PRN
  Administered 2022-10-16: 1.25 mg via RESPIRATORY_TRACT
  Filled 2022-10-15: qty 0.5

## 2022-10-15 MED ORDER — ACETAMINOPHEN 650 MG RE SUPP: 650.0000 mg | Freq: Four times a day (QID) | RECTAL | Status: DC | PRN

## 2022-10-15 MED ORDER — ACETAMINOPHEN 325 MG PO TABS: 650.0000 mg | ORAL_TABLET | Freq: Four times a day (QID) | ORAL | Status: DC | PRN

## 2022-10-15 MED ORDER — LEVALBUTEROL HCL 1.25 MG/0.5ML IN NEBU
1.2500 mg | INHALATION_SOLUTION | Freq: Four times a day (QID) | RESPIRATORY_TRACT | Status: DC
  Administered 2022-10-16 – 2022-10-17 (×6): 1.25 mg via RESPIRATORY_TRACT
  Filled 2022-10-15 (×7): qty 0.5

## 2022-10-15 MED ORDER — PREDNISONE 20 MG PO TABS
60.0000 mg | ORAL_TABLET | Freq: Once | ORAL | Status: AC
  Administered 2022-10-15: 60 mg via ORAL
  Filled 2022-10-15: qty 3

## 2022-10-15 NOTE — ED Provider Triage Note (Addendum)
Emergency Medicine Provider Triage Evaluation Note  Brian [name] , a 72 y.o. male  was evaluated in triage.  Pt complains of chest pain and shortness of breath.  States he has had chest pain shortness of breath for several years but has been worse in the last week.  He has A-fib and is compliant with his medications.  He is on Xarelto.  States has had a cough in the last week and had wheezing.  Denies history of asthma and COPD.  States shortness of breath and chest pain is worse with exertion.  Chest pain located in the left side of his chest and feels stabbing and nonradiating.  Review of Systems  Positive: See above Negative: See above  Physical Exam  BP (!) 144/64 (BP Location: Right Arm)   Pulse 78   Temp 98.1 F (36.7 C)   Resp (!) 22   Ht 5\' 9"  (1.753 m)   Wt 118 kg   SpO2 93%   BMI 38.42 kg/m  Gen:   Awake, no distress   Resp:  Wheezing and coarse, initially satting 82% requiring oxygen.  Now on 2 L MSK:   Moves extremities without difficulty  Other:    Medical Decision Making  Medically screening exam initiated at 4:16 PM.  Appropriate orders placed.  Brian [name] was informed that the remainder of the evaluation will be completed by another provider, this initial triage assessment does not replace that evaluation, and the importance of remaining in the ED until their evaluation is complete.  Work up started    Gareth Eagle, PA-C 10/15/22 1620

## 2022-10-15 NOTE — ED Provider Notes (Signed)
[place] EMERGENCY DEPARTMENT AT [facility] Provider Note   CSN: [id number] Arrival date & time: 10/15/22  1550     History  Chief Complaint  Patient presents with   Chest Pain    Brian [name] is a 72 y.o. male.  Patient is a 72 year old male with a past medical history of A-fib on Xarelto, bladder cancer on chemotherapy maintenance treatments every 6 months, CHF and tobacco use presenting to the emergency department with shortness of breath.  The patient states that he has had increasing shortness of breath over the last 2 weeks.  He states initially he would get winded with activity and now he can hardly walk to the bathroom or get up without getting short of breath.  He states that he has had associated chest pain and tightness that is coming and going.  He states that he has had a productive cough.  He denies any fevers.  He denies any lower extremity swelling.  He states that he has been compliant with his anticoagulation.  He denies any history of blood clots, recent hospitalizations or surgery, recent long travel in the car or plane.  The history is provided by the patient and a relative.  Chest Pain      Home Medications Prior to Admission medications   Medication Sig Start Date End Date Taking? Authorizing Provider  amiodarone (PACERONE) 200 MG tablet Take 1 tablet (200 mg total) by mouth daily. 09/08/22   Newman Nip, NP  aspirin-acetaminophen-caffeine (EXCEDRIN EXTRA STRENGTH) [phone] MG tablet Take 1 tablet by mouth every 6 (six) hours as needed for headache.    [provider]  cephALEXin (KEFLEX) 250 MG capsule Take 250 mg by mouth at bedtime. 04/11/22   [provider]  diltiazem (CARDIZEM CD) 120 MG 24 hr capsule Take 1 capsule (120 mg total) by mouth daily. 01/08/22   Jake Bathe, MD  folic acid (FOLVITE) 1 MG tablet Take 1 mg by mouth daily. 02/14/22   [provider]  furosemide (LASIX) 40 MG tablet Take 1 tablet  (40 mg total) by mouth daily. 01/19/22   Jake Bathe, MD  golimumab (SIMPONI ARIA) 50 MG/4ML SOLN injection Inject 50 mg into the vein every 8 (eight) weeks.    [provider]  hydroxychloroquine (PLAQUENIL) 200 MG tablet Take 200 mg by mouth daily.    [provider]  KLOR-CON M20 20 MEQ tablet TAKE 1 TABLET BY MOUTH EVERY DAY 01/21/22   Jake Bathe, MD  methotrexate (RHEUMATREX) 2.5 MG tablet Take 25 mg by mouth every Friday. 10 tablets 1 time a week on Fridays in the evening 04/26/17   [provider]  pantoprazole (PROTONIX) 40 MG tablet Take 1 tablet (40 mg total) by mouth daily. 07/20/22 10/09/22  Sheilah Pigeon, PA-C  rivaroxaban (XARELTO) 20 MG TABS tablet TAKE 1 TABLET BY MOUTH DAILY WITH SUPPER. 09/28/22   Mealor, Roberts Gaudy, MD  tamsulosin (FLOMAX) 0.4 MG CAPS capsule Take 0.4 mg by mouth at bedtime.    [provider]      Allergies    Aquacel-ag surgical hydrofiber [wound dressings] and Tape    Review of Systems   Review of Systems  Cardiovascular:  Positive for chest pain.    Physical Exam Updated Vital Signs BP (!) 144/64 (BP Location: Right Arm)   Pulse 78   Temp 98.1 F (36.7 C)   Resp (!) 22   Ht  (1.753 m)  Wt 118 kg   SpO2 93%   BMI 38.42 kg/m  Physical Exam Vitals and nursing note reviewed.  Constitutional:      General: He is not in acute distress.    Appearance: He is well-developed.  HENT:     Head: Normocephalic and atraumatic.  Eyes:     Extraocular Movements: Extraocular movements intact.  Cardiovascular:     Rate and Rhythm: Normal rate and regular rhythm.     Pulses:          Radial pulses are 2+ on the right side and 2+ on the left side.     Heart sounds: Normal heart sounds.  Pulmonary:     Effort: Pulmonary effort is normal. No tachypnea or respiratory distress.     Breath sounds: Wheezing (Diffuse bilateral expiratory and inspiratory wheeze) present.  Abdominal:     Palpations: Abdomen is  soft.     Tenderness: There is no abdominal tenderness.  Genitourinary:    Rectum: Guaiac stool: Brown stool.  Musculoskeletal:        General: Normal range of motion.     Cervical back: Normal range of motion and neck supple.     Right lower leg: No edema.     Left lower leg: No edema.  Skin:    General: Skin is warm and dry.  Neurological:     General: No focal deficit present.     Mental Status: He is alert and oriented to person, place, and time.  Psychiatric:        Mood and Affect: Mood normal.        Behavior: Behavior normal.     ED Results / Procedures / Treatments   Labs (all labs ordered are listed, but only abnormal results are displayed) Labs Reviewed  BASIC METABOLIC PANEL - Abnormal; Notable for the following components:      Result Value   Chloride 97 (*)    Glucose, Bld 131 (*)    Calcium 8.4 (*)    All other components within normal limits  CBC WITH DIFFERENTIAL/PLATELET - Abnormal; Notable for the following components:   RBC 3.54 (*)    Hemoglobin 8.5 (*)    HCT 30.1 (*)    MCH 24.0 (*)    MCHC 28.2 (*)    RDW 18.1 (*)    nRBC 0.3 (*)    Abs Immature Granulocytes 0.30 (*)    All other components within normal limits  BRAIN NATRIURETIC PEPTIDE - Abnormal; Notable for the following components:   B Natriuretic Peptide 321.4 (*)    All other components within normal limits  RESP PANEL BY RT-PCR (RSV, FLU A&B, COVID)  [id number]  POC OCCULT BLOOD, ED  TROPONIN I (HIGH SENSITIVITY)  TROPONIN I (HIGH SENSITIVITY)    EKG EKG Interpretation  Date/Time:  Thursday October 15 2022 15:57:34 EDT Ventricular Rate:  78 PR Interval:  180 QRS Duration: 84 QT Interval:  404 QTC Calculation: 460 R Axis:   85 Text Interpretation:  Poor data quality, interpretation may be adversely affected Sinus rhythm with Premature atrial complexes in a pattern of bigeminy Otherwise normal ECG No significant change since last tracing Confirmed by Elayne Snare ([id number]) on  10/15/2022 6:06:46 PM  Radiology DG Chest 1 View  Result Date: 10/15/2022 CLINICAL DATA:  Shortness of breath.  Chest pain. EXAM: CHEST  1 VIEW COMPARISON:  03/18/2018 and cardiac CT 07/14/2022 FINDINGS: Atherosclerotic calcification of the aortic arch. Mild enlargement of the cardiopericardial silhouette. Indistinct pulmonary vasculature  with upper zone pulmonary vascular prominence favoring pulmonary venous hypertension. No overt edema. Suspected mild central airway thickening. No airspace opacity. No blunting of the costophrenic angles. No substantial bony abnormality noted. IMPRESSION: 1. Mild enlargement of the cardiopericardial silhouette with pulmonary venous hypertension but no overt edema. 2. Airway thickening is present, suggesting bronchitis or reactive airways disease. 3.  Aortic Atherosclerosis (ICD10-I70.0). Electronically Signed   By: Gaylyn Rong M.D.   On: 10/15/2022 17:18    Procedures Procedures    Medications Ordered in ED Medications  ipratropium-albuterol (DUONEB) 0.5-2.5 (3) MG/3ML nebulizer solution 3 mL (has no administration in time range)  predniSONE (DELTASONE) tablet 60 mg (has no administration in time range)  cefTRIAXone (ROCEPHIN) 1 g in sodium chloride 0.9 % 100 mL IVPB (has no administration in time range)  azithromycin (ZITHROMAX) 500 mg in sodium chloride 0.9 % 250 mL IVPB (has no administration in time range)  ipratropium-albuterol (DUONEB) 0.5-2.5 (3) MG/3ML nebulizer solution 3 mL (3 mLs Nebulization Given 10/15/22 1630)    ED Course/ Medical Decision Making/ A&P                             Medical Decision Making This patient presents to the ED with chief complaint(s) of shortness of breath with pertinent past medical history of A-fib on Xarelto, CHF, tobacco use, bladder cancer on maintenance chemotherapy which further complicates the presenting complaint. The complaint involves an extensive differential diagnosis and also carries with it a high  risk of complications and morbidity.    The differential diagnosis includes ACS, arrhythmia, anemia, pneumonia, pneumothorax, pulmonary edema, pleural effusion, obstructive lung disease, PE less likely as he has been compliant on his Xarelto  Additional history obtained: Additional history obtained from family Records reviewed outpatient cardiology records  ED Course and Reassessment: On patient's arrival to the emergency department he was found to be hypoxic to 84% on room air.  He was placed on 3 L nasal cannula.  He was also found to be wheezing and given a single DuoNeb.  He was seen by provider in triage and had EKG, labs and chest x-ray performed.  EKG is without acute ischemic changes and troponin is negative.  Labs does show acute on chronic anemia with a hemoglobin of 8.5 from his baseline of around 10 2 weeks ago.  BNP is mildly elevated at 300 without any pulmonary edema on chest x-ray.  He does have findings concerning with bronchitis.  Patient's viral swab was negative.  On my evaluation, the patient reported that he did have improvement after the DuoNeb.  He is still having inspiratory and expiratory wheeze on exam though is in no acute respiratory distress.  He will be given additional nebs and steroids and will be given antibiotics for his bronchitis.  Patient had Hemoccult performed in the setting of his anemia and blood thinners.  Patient will require admission for his hypoxia.  Independent labs interpretation:  The following labs were independently interpreted: Acute on chronic anemia, labs otherwise within normal range  Independent visualization of imaging: - I independently visualized the following imaging with scope of interpretation limited to determining acute life threatening conditions related to emergency care: Chest x-ray, which revealed bronchitis  Consultation: - Consulted or discussed management/test interpretation w/ external professional:  Hospitalists  Consideration for admission or further workup: Patient requires admission for his hypoxia in the setting of bronchitis Social Determinants of health: N/A    Risk Prescription  drug management. Decision regarding hospitalization.          Final Clinical Impression(s) / ED Diagnoses Final diagnoses:  Bronchitis  Hypoxia  Anemia, unspecified type    Rx / DC Orders ED Discharge Orders     None         Rexford Maus, DO 10/15/22 2220

## 2022-10-15 NOTE — ED Triage Notes (Signed)
Pt c/o bilateral chest pain and shortness of breath worsening in the past several days. Pt also states he was told his hemoglobin was low recently. Pt 84% on room air in triage.

## 2022-10-15 NOTE — H&P (Signed)
History and Physical      Brian [name] ZOX:[id number] DOB: 06-24-1951 DOA: 10/15/2022  PCP: Joycelyn Rua, MD  Patient coming from: home   I have personally briefly reviewed patient's old medical records in [facility]  Chief Complaint: Shortness of breath  HPI: Brian [name] is a 72 y.o. male with medical history significant for paroxysmal atrial fibrillation, chronically anticoagulated on Xarelto, bladder cancer undergoing maintenance chemotherapy, chronic tobacco abuse, chronic diastolic heart failure, essential hypertension, rheumatoid arthritis, anemia of chronic disease associated baseline hemoglobin 10-13, who is admitted to [facility] on 10/15/2022 with acute bronchitis with bronchospasm after presenting from home to [facility] ED complaining of shortness of breath.   Patient reports 1 week of progressive shortness of breath associate with wheezing as well as new onset productive cough in the absence of any hemoptysis.  Reports that shortness of breath is not associated any orthopnea, PND, or worsening of peripheral edema.  Denies any associated subjective fever, chills, rigors, or generalized myalgias.  Not associate any chest pain, palpitations, diaphoresis, nausea, vomiting, dizziness, presyncope, or syncope.  No recent trauma.  He conveys a long history of tobacco abuse, having smoked half pack per day for nearly 50 years, but denies any known history of underlying COPD.  Denies any baseline supplemental oxygen requirements.  Medical history notable for chronic diastolic heart failure as well as paroxysmal atrial fibrillation for which she is chronically anticoagulated on Xarelto, reporting good compliance with his chronic anticoagulation.  Most recent echocardiogram occurred in November 2023 was notable for LVEF 50 to 55%, no focal motion abnormalities and management of diastolic parameters, normal right ventricular systolic and no evidence of significant valvular  pathology.  This is a history of anemia of chronic disease, associated baseline hemoglobin 10-13.  Denies any recent melena, hematochezia, hematemesis.  Denies any increased hematuria.  Aside from his chronic anticoagulation via Xarelto in the setting of paroxysmal atrial fibrillation, the patient denies any use of additional blood thinners, including no aspirin as an outpatient.     ED Course:  Vital signs in the ED were notable for the following: Temperature max 99.4; heart rates in the 70s to 90s; systolic pressures in the 140s; respiratory rate 14-22, initial oxygen saturation 84% on room air, Sosan improving into the range 95 to 98% on 2 L 3 L no acute.  Labs were notable for the following: BMP notable for sodium 135, potassium 4.5, bicarbonate 29, creatinine 0.76.  BNP 321 compared to most recent prior value of 150 in June 2018, high-sensitivity troponin I initially noted to be 11, with repeat value trending down to 9.  CBC notable for white cell count 6000, hemoglobin 8.5 compared to most recent prior value of 8.4 on 10/09/2022, platelet count 250.  COVID, influenza, RSV PCR were all negative.  Per my interpretation, EKG in ED demonstrated the following: Ventricular bigeminy with heart rate 78, nonspecific T wave inversion in aVL, no evidence of ST changes, including no evidence of ST elevation.  Imaging and additional notable ED work-up: Chest x-ray, performed radiology read, showed evidence of airway thickening, consistent with bronchitis, will demonstrate no evidence of infiltrate, edema, effusion, or pneumothorax.  While in the ED, the following were administered: 2 nebulizer treatments x 2, following which the patient noted some improvement in the severity of his shortness of breath, prednisone 60 mg p.o. x 1 dose, azithromycin, Rocephin.  Subsequently, the patient was admitted for further evaluation management of presenting acute bronchitis with bronchospasm,  complicated by acute epoxy  respiratory distress, with labs notable for acute on chronic anemia.     Review of Systems: As per HPI otherwise 10 point review of systems negative.   Past Medical History:  Diagnosis Date   Anemia    none since 2019   Anxiety    Atrial flutter    a. 08/2012   CHF (congestive heart failure)    chronic diastolic chf, pt denies   GERD (gastroesophageal reflux disease)    Gout    pt denies   History of kidney stones    passed   Hypertension    Obesity    Paroxysmal atrial fibrillation    Pneumonia 09/2016   Rheumatoid arthritis    "a little bit qwhere" (07/22/2017)   Shortness of breath    with heavy  exertion   Tobacco abuse     Past Surgical History:  Procedure Laterality Date   A-FLUTTER ABLATION N/A 12/29/2016   Procedure: A-Flutter Ablation;  Surgeon: Hillis Range, MD;  Location: [facility] INVASIVE CV LAB;  Service: Cardiovascular;  Laterality: N/A;   ATRIAL FIBRILLATION ABLATION N/A 07/20/2022   Procedure: ATRIAL FIBRILLATION ABLATION;  Surgeon: Maurice Small, MD;  Location: [facility] INVASIVE CV LAB;  Service: Cardiovascular;  Laterality: N/A;   CARDIOVERSION N/A 11/05/2016   Procedure: CARDIOVERSION;  Surgeon: Quintella Reichert, MD;  Location: [facility] ENDOSCOPY;  Service: Cardiovascular;  Laterality: N/A;   CARDIOVERSION N/A 06/09/2022   Procedure: CARDIOVERSION;  Surgeon: Jake Bathe, MD;  Location: [facility] ENDOSCOPY;  Service: Cardiovascular;  Laterality: N/A;   CARDIOVERSION N/A 09/14/2022   Procedure: CARDIOVERSION;  Surgeon: Christell Constant, MD;  Location: [facility] ENDOSCOPY;  Service: Cardiovascular;  Laterality: N/A;   IR THORACENTESIS ASP PLEURAL SPACE W/IMG GUIDE  10/01/2016   JOINT REPLACEMENT     Right hip and left knee   MICROLARYNGOSCOPY  09/02/2007   with excision of right vocal cord mass, Dr. Pollyann Kennedy   PROSTATE BIOPSY N/A 02/22/2020   Procedure: BIOPSY TRANSRECTAL ULTRASONIC PROSTATE (TUBP);  Surgeon: Bjorn Pippin, MD;  Location: [facility];  Service: Urology;   Laterality: N/A;   TOTAL HIP ARTHROPLASTY Right 11/17/2017   Procedure: RIGHT TOTAL HIP ARTHROPLASTY ANTERIOR APPROACH;  Surgeon: Tarry Kos, MD;  Location: [facility] OR;  Service: Orthopedics;  Laterality: Right;   TOTAL HIP ARTHROPLASTY Left 03/07/2018   Procedure: LEFT TOTAL HIP ARTHROPLASTY ANTERIOR APPROACH;  Surgeon: Tarry Kos, MD;  Location: [facility] OR;  Service: Orthopedics;  Laterality: Left;   TOTAL KNEE ARTHROPLASTY Left 07/22/2017   Procedure: LEFT TOTAL KNEE ARTHROPLASTY;  Surgeon: Tarry Kos, MD;  Location: [facility] OR;  Service: Orthopedics;  Laterality: Left;   TRANSURETHRAL RESECTION OF BLADDER TUMOR N/A 04/02/2020   Procedure: RESTAGING TRANSURETHRAL RESECTION OF BLADDER TUMOR (TURBT);  Surgeon: Bjorn Pippin, MD;  Location: [facility];  Service: Urology;  Laterality: N/A;  GENERAL ANESTHESIA WIHT PARALYSIS   TRANSURETHRAL RESECTION OF BLADDER TUMOR WITH MITOMYCIN-C N/A 02/22/2020   Procedure: TRANSURETHRAL RESECTION OF BLADDER TUMOR WITH   GEMCITABINE;  Surgeon: Bjorn Pippin, MD;  Location: [facility];  Service: Urology;  Laterality: N/A;    Social History:  reports that he has been smoking cigarettes. He has a 5.85 pack-year smoking history. He has never used smokeless tobacco. He reports current alcohol use. He reports that he does not use drugs.   Allergies  Allergen Reactions   Aquacel-Ag Surgical Hydrofiber [Wound Dressings] Other (See Comments)    Tears skin  Tape Rash and Other (See Comments)    Paper tape is preferred, please!! Adhesive tape tears skin    Family History  Problem Relation Age of Onset   Cancer Mother    Heart disease Father    Heart attack Father    Cancer Father    Diabetes Sister     Family history reviewed and not pertinent    Prior to Admission medications   Medication Sig Start Date End Date Taking? Authorizing Provider  amiodarone (PACERONE) 200 MG tablet Take 1 tablet (200 mg total) by mouth daily. 09/08/22    Newman Nip, NP  aspirin-acetaminophen-caffeine (EXCEDRIN EXTRA STRENGTH) [phone] MG tablet Take 1 tablet by mouth every 6 (six) hours as needed for headache.    [provider]  cephALEXin (KEFLEX) 250 MG capsule Take 250 mg by mouth at bedtime. 04/11/22   [provider]  diltiazem (CARDIZEM CD) 120 MG 24 hr capsule Take 1 capsule (120 mg total) by mouth daily. 01/08/22   Jake Bathe, MD  folic acid (FOLVITE) 1 MG tablet Take 1 mg by mouth daily. 02/14/22   [provider]  furosemide (LASIX) 40 MG tablet Take 1 tablet (40 mg total) by mouth daily. 01/19/22   Jake Bathe, MD  golimumab (SIMPONI ARIA) 50 MG/4ML SOLN injection Inject 50 mg into the vein every 8 (eight) weeks.    [provider]  hydroxychloroquine (PLAQUENIL) 200 MG tablet Take 200 mg by mouth daily.    [provider]  KLOR-CON M20 20 MEQ tablet TAKE 1 TABLET BY MOUTH EVERY DAY 01/21/22   Jake Bathe, MD  methotrexate (RHEUMATREX) 2.5 MG tablet Take 25 mg by mouth every Friday. 10 tablets 1 time a week on Fridays in the evening 04/26/17   [provider]  pantoprazole (PROTONIX) 40 MG tablet Take 1 tablet (40 mg total) by mouth daily. 07/20/22 10/09/22  Sheilah Pigeon, PA-C  rivaroxaban (XARELTO) 20 MG TABS tablet TAKE 1 TABLET BY MOUTH DAILY WITH SUPPER. 09/28/22   Mealor, Roberts Gaudy, MD  tamsulosin (FLOMAX) 0.4 MG CAPS capsule Take 0.4 mg by mouth at bedtime.    [provider]     Objective    Physical Exam: Vitals:   10/15/22 1602 10/15/22 1602 10/15/22 1613 10/15/22 2146  BP:  (!) 144/64  (!) 147/86  Pulse:  78  (!) 49  Resp:  (!) 22  14  Temp:  98.1 F (36.7 C)  99.4 F (37.4 C)  TempSrc:    Oral  SpO2: (!) 84% 93%  98%  Weight:   118 kg   Height:   5\' 9"  (1.753 m)     General: appears to be stated age; alert, oriented; mildly increased work of breathing noted Skin: warm, dry, no rash Head:  AT/[place] Mouth:  Oral mucosa membranes  appear moist, normal dentition Neck: supple; trachea midline Heart:  RRR; did not appreciate any M/R/G Lungs: Bilateral expiratory wheezes noted, but otherwise CTAB, did not appreciate any rales or rhonchi Abdomen: + BS; soft, ND, NT Vascular: 2+ pedal pulses b/l; 2+ radial pulses b/l Extremities: no peripheral edema, no muscle wasting Neuro: strength and sensation intact in upper and lower extremities b/l    Labs on Admission: I have personally reviewed following labs and imaging studies  CBC: Recent Labs  Lab 10/09/22 1617 10/15/22 1647  WBC 9.8 6.0  NEUTROABS  --  3.7  HGB 8.4* 8.5*  HCT 28.0* 30.1*  MCV 82 85.0  PLT 300 250   Basic Metabolic Panel: Recent Labs  Lab 10/15/22 1647  NA 135  K 4.5  CL 97*  CO2 29  GLUCOSE 131*  BUN 12  CREATININE 0.76  CALCIUM 8.4*   GFR: Estimated Creatinine Clearance: 107.3 mL/min (by C-G formula based on SCr of 0.76 mg/dL). Liver Function Tests: Recent Labs  Lab 10/09/22 1617  AST 17  ALT 23  ALKPHOS 81  BILITOT 0.3  PROT 6.9  ALBUMIN 3.9   No results for input(s): "LIPASE", "AMYLASE" in the last 168 hours. No results for input(s): "AMMONIA" in the last 168 hours. Coagulation Profile: No results for input(s): "INR", "PROTIME" in the last 168 hours. Cardiac Enzymes: No results for input(s): "CKTOTAL", "CKMB", "CKMBINDEX", "TROPONINI" in the last 168 hours. BNP (last 3 results) Recent Labs    04/29/22 0944  PROBNP 136   HbA1C: No results for input(s): "HGBA1C" in the last 72 hours. CBG: No results for input(s): "GLUCAP" in the last 168 hours. Lipid Profile: No results for input(s): "CHOL", "HDL", "LDLCALC", "TRIG", "CHOLHDL", "LDLDIRECT" in the last 72 hours. Thyroid Function Tests: No results for input(s): "TSH", "T4TOTAL", "FREET4", "T3FREE", "THYROIDAB" in the last 72 hours. Anemia Panel: No results for input(s): "VITAMINB12", "FOLATE", "FERRITIN", "TIBC", "IRON", "RETICCTPCT" in the last 72 hours. Urine  analysis:    Component Value Date/Time   COLORURINE RED (A) 05/09/2018 1217   APPEARANCEUR CLOUDY (A) 05/09/2018 1217   LABSPEC 1.025 05/09/2018 1217   PHURINE 6.5 05/09/2018 1217   GLUCOSEU (A) 05/09/2018 1217    TEST NOT REPORTED DUE TO COLOR INTERFERENCE OF URINE PIGMENT   HGBUR (A) 05/09/2018 1217    TEST NOT REPORTED DUE TO COLOR INTERFERENCE OF URINE PIGMENT   BILIRUBINUR (A) 05/09/2018 1217    TEST NOT REPORTED DUE TO COLOR INTERFERENCE OF URINE PIGMENT   KETONESUR (A) 05/09/2018 1217    TEST NOT REPORTED DUE TO COLOR INTERFERENCE OF URINE PIGMENT   PROTEINUR (A) 05/09/2018 1217    TEST NOT REPORTED DUE TO COLOR INTERFERENCE OF URINE PIGMENT   UROBILINOGEN 0.2 09/05/2012 0839   NITRITE (A) 05/09/2018 1217    TEST NOT REPORTED DUE TO COLOR INTERFERENCE OF URINE PIGMENT   LEUKOCYTESUR (A) 05/09/2018 1217    TEST NOT REPORTED DUE TO COLOR INTERFERENCE OF URINE PIGMENT    Radiological Exams on Admission: DG Chest 1 View  Result Date: 10/15/2022 CLINICAL DATA:  Shortness of breath.  Chest pain. EXAM: CHEST  1 VIEW COMPARISON:  03/18/2018 and cardiac CT 07/14/2022 FINDINGS: Atherosclerotic calcification of the aortic arch. Mild enlargement of the cardiopericardial silhouette. Indistinct pulmonary vasculature with upper zone pulmonary vascular prominence favoring pulmonary venous hypertension. No overt edema. Suspected mild central airway thickening. No airspace opacity. No blunting of the costophrenic angles. No substantial bony abnormality noted. IMPRESSION: 1. Mild enlargement of the cardiopericardial silhouette with pulmonary venous hypertension but no overt edema. 2. Airway thickening is present, suggesting bronchitis or reactive airways disease. 3.  Aortic Atherosclerosis (ICD10-I70.0). Electronically Signed   By: Gaylyn Rong M.D.   On: 10/15/2022 17:18      Assessment/Plan   Principal Problem:   Acute bronchitis with bronchospasm Active Problems:   Tobacco abuse    Essential hypertension   Chronic diastolic CHF (congestive heart failure)   Acute hypoxic respiratory failure   Acute on chronic anemia   Paroxysmal atrial fibrillation   Rheumatoid arthritis      #) Acute bronchitis with bronchospasm: diagnosis on the basis of 1 week of  progressive shortness of breath associated with new onset productive cough and wheezing, the latter suggestive of a component of bronchospasm, with CXR showing airway thickening, consistent with bronchitis, demonstrating no evidence of additional cardiopulmonary process, including no evidence of infiltrate, edema, effusion, or pneumothorax.. This is in the absence of any known chronic pulmonary conditions, including no known history of underlying COPD.  However, given the patient's long smoking history, there is the possibility of undiagnosed underlying COPD.  He would likely ultimately benefit from outpatient PFT testing to further explore this possibility.  In the meantime, management for acute COPD exacerbation versus acute bronchitis with bronchospasm will not be significantly different on an inpatient basis.  In terms of causative factors leading to this acute bronchitis with bronchospasm, suspect a viral etiology.  Of note, COVID, influenza, RSV PCR were all negative.  Also add on procalcitonin level to further evaluate for any contribution from underlying bacterial pneumonia, will noting no evidence of infiltrate on presenting chest x-ray.  of note, no clinical or radiographic evidence to suggest acutely decompensated heart failure at this time. Additionally, ACS appears less likely in the absence of chest pain, with EKG showing no evidence of acute ischemic changes, and with negative Troponin results. Clinically, acute PE also appears to be less likely at this time, particularly given the patient's reported good compliance with chronic anticoagulation on Xarelto.  In terms of other potential secondary contributions towards the  patient's presenting shortness of breath, his acute on chronic anemia is noted, as further detailed below.   Plan: monitor continuous pulse oxymetry. Monitor on telemetry.  Solu-Medrol 80 mg IV twice daily scheduled Xopenex/ipratropium nebulizers q6 hours. Prn Xopenex nebulizer. Check serum Mg and Phos levels. BMP in the morning. Repeat CBC in the morning.  Add on procalcitonin level.  Counseled the patient on importance of complete smoking discontinuation.  Check blood gas.              #) Acute hypoxic respiratory distress: in the context of acute respiratory symptoms and no known baseline supplemental O2 requirements, presenting O2 sat note to be 84% on room air, Sosan improving into the mid 90s on 2 3 L nasal cannula, thereby meeting criteria for acute hypoxic respiratory distress as opposed to acute hypoxic respiratory failure at this time. Appears to be on basis of acute bronchitis with bronchospasm, as further detailed above.   In terms of other considered etiologies, ACS appears less likely at this time in the absence of any recent CP and in the context of negative troponin and presenting EKG showing no e/o acute ischemic process. No clinical or radiographic evidence to suggest acutely decompensated heart failure at this time. Clinically, presentation is less suggestive of acute PE at this time.  Additionally, COVID, influenza, RSV PCR all negative.   Plan: further evaluation/management of presenting acute bronchitis with bronchospasm, as above. Monitor continuous pulse ox with prn supplemental O2 to maintain O2 sats greater than or equal to 92%. monitor on telemetry. CMP/CBC in the AM. Check serum Mg and Phos levels. Check blood gas.  Solumedrol, scheduled nebulizers, as needed Xopenex nebulizer, as further detailed above.  Add on procalcitonin level.            #) Acute on chronic anemia: The context of documented history of anemia of chronic disease associated with  baseline hemoglobin range 10-13, presenting hemoglobin found to be slightly lower than his baseline, with presenting value noted to be 8.5 relative to most recent prior value 8.4 on  10/09/2022.  Associated normocytic properties, and no evidence of overt blood loss at this time, cleaning the patient's history of which she conveys no recent melena or hematochezia.  Fecal occult blood test currently pending after EDP performed DRE revealing brown-colored stool.  It is notable that he is on chronic anticoagulation with Xarelto in the setting of paroxysmal atrial fibrillation.  Anemia of acute illness superimposed on anemia of chronic disease is a possibility.  Will pursue further laboratory evaluation, as outlined below.  Plan: Follow-up results of fecal occult blood test.  Add on iron studies, B12 level, folate acid level.  Check INR, reticulocyte count.  Type and screen ordered.  Repeat CBC in the morning.               #) Paroxysmal atrial fibrillation: Documented history of such. In setting of CHA2DS2-VASc score of 5, there is an indication for chronic anticoagulation for thromboembolic prophylaxis. Consistent with this, patient is chronically anticoagulated on Xarelto. Home AV nodal blocking regimen: Diltiazem.  Most recent echocardiogram was performed in November 2023, with results notable for LVEF 50 to 55%, indeterminate diastolic parameters, as well as no evidence of significant valvular pathology, with additional results as conveyed above. Presenting EKG is suggestive of ventricular bigeminy, in the absence of any evidence of acute ischemic changes, as further detailed above.  In addition to rate control strategy, it also appears that a rhythm control strategy is pursued as an outpatient, the patient on daily amiodarone at home.   Plan: monitor strict I's & O's and daily weights. CMP/CBC in AM. Check serum mag level. Continue home AV nodal blocking regimen.  Continue home amiodarone as well  as some Xarelto.  Monitor on symmetry.              #) Chronic tobacco abuse: Patient conveys that they are a current smoker, having smoked half ppd for nearly 50 years.  As discussed above, patient may have.  See undiagnosed underlying COPD, for which she may benefit from outpatient PFT testing to further explore this possibility.  Plan: Counseled the patient for less than 2 minutes on the importance of complete smoking discontinuation.  Order placed for prn nicotine patch for use during this hospitalization.                #) Chronic diastolic heart failure: documented history of such, with most recent echocardiogram performed in November 2023, with results notable for LVEF 55%, and indeterminate diastolic parameters, with additional results as conveyed above. No clinical or radiographic evidence to suggest acutely decompensated heart failure at this time. home diuretic regimen reportedly consists of the following: Lasix 40 mg p.o. daily.   Plan: monitor strict I's & O's and daily weights. Repeat CMP in AM. Check serum mag level. Continue home diuretic regimen.                #) Essential Hypertension: documented h/o such, with outpatient antihypertensive regimen including diltiazem, Lasix.  SBP's in the ED today: 140s mmHg.   Plan: Close monitoring of subsequent BP via routine VS. continue outpatient hypertensive medications, as above.             #) Rheumatoid arthritis: Documented history of such, with the patient on hydroxychloroquine as well as q. weekly methotrexate, with next dose scheduled to occur on Friday, 10/16/2022.  Plan: Continue outpatient hydroxychloroquine.  If the patient remains in the hospital on 10/16/2022, would be due for his next dose of methotrexate.  DVT prophylaxis: SCD's + home Eliquis   Code Status: Full code Family Communication: none Disposition Plan: Per Rounding Team Consults called: none;  Admission  status: Inpatient     I SPENT GREATER THAN 75  MINUTES IN CLINICAL CARE TIME/MEDICAL DECISION-MAKING IN COMPLETING THIS ADMISSION.      Chaney Born Dmiyah Liscano DO [facility]  From 7PM - 7AM   10/15/2022, 10:30 PM

## 2022-10-16 ENCOUNTER — Encounter (HOSPITAL_COMMUNITY): Payer: Self-pay | Admitting: Internal Medicine

## 2022-10-16 DIAGNOSIS — D649 Anemia, unspecified: Secondary | ICD-10-CM | POA: Diagnosis not present

## 2022-10-16 DIAGNOSIS — I480 Paroxysmal atrial fibrillation: Secondary | ICD-10-CM | POA: Diagnosis present

## 2022-10-16 DIAGNOSIS — J9601 Acute respiratory failure with hypoxia: Secondary | ICD-10-CM | POA: Diagnosis present

## 2022-10-16 DIAGNOSIS — R0902 Hypoxemia: Secondary | ICD-10-CM | POA: Diagnosis not present

## 2022-10-16 DIAGNOSIS — J441 Chronic obstructive pulmonary disease with (acute) exacerbation: Secondary | ICD-10-CM | POA: Diagnosis not present

## 2022-10-16 DIAGNOSIS — J40 Bronchitis, not specified as acute or chronic: Secondary | ICD-10-CM

## 2022-10-16 DIAGNOSIS — M069 Rheumatoid arthritis, unspecified: Secondary | ICD-10-CM | POA: Diagnosis present

## 2022-10-16 LAB — COMPREHENSIVE METABOLIC PANEL
ALT: 19 U/L (ref 0–44)
AST: 20 U/L (ref 15–41)
Albumin: 3.3 g/dL — ABNORMAL LOW (ref 3.5–5.0)
Alkaline Phosphatase: 67 U/L (ref 38–126)
Anion gap: 9 (ref 5–15)
BUN: 12 mg/dL (ref 8–23)
CO2: 29 mmol/L (ref 22–32)
Calcium: 8.4 mg/dL — ABNORMAL LOW (ref 8.9–10.3)
Chloride: 100 mmol/L (ref 98–111)
Creatinine, Ser: 0.79 mg/dL (ref 0.61–1.24)
GFR, Estimated: >60 mL/min (ref >60)
Glucose, Bld: 125 mg/dL — ABNORMAL HIGH (ref 70–99)
Potassium: 5 mmol/L (ref 3.5–5.1)
Sodium: 138 mmol/L (ref 135–145)
Total Bilirubin: <0.1 mg/dL — ABNORMAL LOW (ref 0.3–1.2)
Total Protein: 7 g/dL (ref 6.5–8.1)

## 2022-10-16 LAB — I-STAT VENOUS BLOOD GAS, ED
Acid-Base Excess: 3 mmol/L — ABNORMAL HIGH (ref 0.0–2.0)
Bicarbonate: 30 mmol/L — ABNORMAL HIGH (ref 20.0–28.0)
Calcium, Ion: 1.12 mmol/L — ABNORMAL LOW (ref 1.15–1.40)
HCT: 29 % — ABNORMAL LOW (ref 39.0–52.0)
Hemoglobin: 9.9 g/dL — ABNORMAL LOW (ref 13.0–17.0)
O2 Saturation: 79 %
Potassium: 5.1 mmol/L (ref 3.5–5.1)
Sodium: 138 mmol/L (ref 135–145)
TCO2: 32 mmol/L (ref 22–32)
pCO2, Ven: 55.7 mmHg (ref 44–60)
pH, Ven: 7.339 (ref 7.25–7.43)
pO2, Ven: 47 mmHg — ABNORMAL HIGH (ref 32–45)

## 2022-10-16 LAB — CBC WITH DIFFERENTIAL/PLATELET
Abs Immature Granulocytes: 0.28 10*3/uL — ABNORMAL HIGH (ref 0.00–0.07)
Basophils Absolute: 0.1 10*3/uL (ref 0.0–0.1)
Basophils Relative: 1 %
Eosinophils Absolute: 0 10*3/uL (ref 0.0–0.5)
Eosinophils Relative: 1 %
HCT: 29.7 % — ABNORMAL LOW (ref 39.0–52.0)
Hemoglobin: 8.3 g/dL — ABNORMAL LOW (ref 13.0–17.0)
Immature Granulocytes: 5 %
Lymphocytes Relative: 10 %
Lymphs Abs: 0.5 10*3/uL — ABNORMAL LOW (ref 0.7–4.0)
MCH: 24.1 pg — ABNORMAL LOW (ref 26.0–34.0)
MCHC: 27.9 g/dL — ABNORMAL LOW (ref 30.0–36.0)
MCV: 86.3 fL (ref 80.0–100.0)
Monocytes Absolute: 0.2 10*3/uL (ref 0.1–1.0)
Monocytes Relative: 4 %
Neutro Abs: 4.6 10*3/uL (ref 1.7–7.7)
Neutrophils Relative %: 79 %
Platelets: 225 10*3/uL (ref 150–400)
RBC: 3.44 MIL/uL — ABNORMAL LOW (ref 4.22–5.81)
RDW: 18.2 % — ABNORMAL HIGH (ref 11.5–15.5)
WBC: 5.7 10*3/uL (ref 4.0–10.5)
nRBC: 0.4 % — ABNORMAL HIGH (ref 0.0–0.2)

## 2022-10-16 LAB — TYPE AND SCREEN
ABO/RH(D): A POS
Antibody Screen: NEGATIVE

## 2022-10-16 LAB — IRON AND TIBC
Iron: <5 ug/dL — ABNORMAL LOW (ref 45–182)
TIBC: 522 ug/dL — ABNORMAL HIGH (ref 250–450)

## 2022-10-16 LAB — FERRITIN: Ferritin: 5 ng/mL — ABNORMAL LOW (ref 24–336)

## 2022-10-16 LAB — PROTIME-INR
INR: 1.1 (ref 0.8–1.2)
Prothrombin Time: 13.8 seconds (ref 11.4–15.2)

## 2022-10-16 LAB — POC OCCULT BLOOD, ED: Fecal Occult Bld: POSITIVE — AB

## 2022-10-16 LAB — RETICULOCYTES
Immature Retic Fract: 26.3 % — ABNORMAL HIGH (ref 2.3–15.9)
RBC.: 3.47 MIL/uL — ABNORMAL LOW (ref 4.22–5.81)
Retic Count, Absolute: 64.2 10*3/uL (ref 19.0–186.0)
Retic Ct Pct: 1.9 % (ref 0.4–3.1)

## 2022-10-16 LAB — PROCALCITONIN: Procalcitonin: <0.1 ng/mL

## 2022-10-16 LAB — VITAMIN B12: Vitamin B-12: 1121 pg/mL — ABNORMAL HIGH (ref 180–914)

## 2022-10-16 LAB — FOLATE: Folate: 15.4 ng/mL (ref >5.9)

## 2022-10-16 LAB — PHOSPHORUS: Phosphorus: 3 mg/dL (ref 2.5–4.6)

## 2022-10-16 LAB — MAGNESIUM
Magnesium: 1.7 mg/dL (ref 1.7–2.4)
Magnesium: 1.8 mg/dL (ref 1.7–2.4)

## 2022-10-16 LAB — BRAIN NATRIURETIC PEPTIDE: B Natriuretic Peptide: 558.8 pg/mL — ABNORMAL HIGH (ref 0.0–100.0)

## 2022-10-16 MED ORDER — RIVAROXABAN 10 MG PO TABS
20.0000 mg | ORAL_TABLET | Freq: Every day | ORAL | Status: DC
  Administered 2022-10-16: 20 mg via ORAL
  Filled 2022-10-16: qty 2

## 2022-10-16 MED ORDER — FUROSEMIDE 40 MG PO TABS
40.0000 mg | ORAL_TABLET | Freq: Every day | ORAL | Status: DC
  Administered 2022-10-16 – 2022-10-17 (×2): 40 mg via ORAL
  Filled 2022-10-16: qty 2
  Filled 2022-10-16: qty 1

## 2022-10-16 MED ORDER — RIVAROXABAN 10 MG PO TABS: 20.0000 mg | ORAL_TABLET | Freq: Every day | ORAL | Status: DC

## 2022-10-16 MED ORDER — ARFORMOTEROL TARTRATE 15 MCG/2ML IN NEBU
15.0000 ug | INHALATION_SOLUTION | Freq: Two times a day (BID) | RESPIRATORY_TRACT | Status: DC
  Administered 2022-10-16 – 2022-10-17 (×3): 15 ug via RESPIRATORY_TRACT
  Filled 2022-10-16 (×3): qty 2

## 2022-10-16 MED ORDER — PANTOPRAZOLE SODIUM 40 MG PO TBEC
40.0000 mg | DELAYED_RELEASE_TABLET | Freq: Every day | ORAL | Status: DC
  Administered 2022-10-16 – 2022-10-17 (×2): 40 mg via ORAL
  Filled 2022-10-16 (×2): qty 1

## 2022-10-16 MED ORDER — NICOTINE 14 MG/24HR TD PT24: 14.0000 mg | MEDICATED_PATCH | Freq: Every day | TRANSDERMAL | Status: DC | PRN

## 2022-10-16 MED ORDER — POTASSIUM CHLORIDE CRYS ER 20 MEQ PO TBCR
20.0000 meq | EXTENDED_RELEASE_TABLET | Freq: Every day | ORAL | Status: DC
  Administered 2022-10-16: 20 meq via ORAL
  Filled 2022-10-16: qty 1

## 2022-10-16 MED ORDER — DILTIAZEM HCL ER COATED BEADS 120 MG PO CP24
120.0000 mg | ORAL_CAPSULE | Freq: Every day | ORAL | Status: DC
  Administered 2022-10-16 – 2022-10-17 (×2): 120 mg via ORAL
  Filled 2022-10-16 (×2): qty 1

## 2022-10-16 MED ORDER — BUDESONIDE 0.25 MG/2ML IN SUSP
0.2500 mg | Freq: Two times a day (BID) | RESPIRATORY_TRACT | Status: DC
  Administered 2022-10-16 – 2022-10-17 (×3): 0.25 mg via RESPIRATORY_TRACT
  Filled 2022-10-16 (×3): qty 2

## 2022-10-16 MED ORDER — LABETALOL HCL 5 MG/ML IV SOLN: 10.0000 mg | INTRAVENOUS | Status: DC | PRN

## 2022-10-16 MED ORDER — HYDROXYCHLOROQUINE SULFATE 200 MG PO TABS
200.0000 mg | ORAL_TABLET | Freq: Every day | ORAL | Status: DC
  Administered 2022-10-16 – 2022-10-17 (×2): 200 mg via ORAL
  Filled 2022-10-16 (×2): qty 1

## 2022-10-16 MED ORDER — AMIODARONE HCL 200 MG PO TABS
200.0000 mg | ORAL_TABLET | Freq: Every day | ORAL | Status: DC
  Administered 2022-10-16 – 2022-10-17 (×2): 200 mg via ORAL
  Filled 2022-10-16 (×2): qty 1

## 2022-10-16 NOTE — Plan of Care (Signed)

## 2022-10-16 NOTE — Progress Notes (Signed)
[facility]                                                                              Brian [name], is a 72 y.o. male, DOB - Oct 14, 1950, QMV:[id number] Admit date - 10/15/2022    Outpatient Primary MD for the patient is Joycelyn Rua, MD  LOS - 1  days  Chief Complaint  Patient presents with   Chest Pain       Brief summary   Patient is a 72 year old male with paroxysmal A-fib, on Xarelto, bladder CA, undergoing maintenance chemo, chronic tobacco use, chronic diastolic CHF, HTN, rheumatoid arthritis, anemia of chronic disease, baseline hemoglobin 10-13, COPD presented with 1 week of progressive shortness of breath, wheezing, productive cough, no hemoptysis.  No orthopnea, PND or peripheral edema.  No fevers or chest pain.  Longstanding history of tobacco use, smokes half pack per day from the early 50 years In ED, O2 sats 84% on room air and was placed on O2 2 L, sats improved to 95%.  RR 14-22, systolic BP in 140s, temp 99.4 F, HR 70s to 90s.  Chest x-ray showed cardiomegaly but no overt edema, bronchitis COVID-19, RSV, flu negative Hemoglobin 8.5, baseline 10-13, hemoglobin was 8.4 on 10/09/2022 and patient had planned to see GI outpatient today -FOBT positive  Assessment & Plan    Principal Problem:   Acute hypoxic respiratory failure, acute COPD exacerbation -Not on O2 at baseline, currently on 2 L, wean as tolerated -Diffusely wheezing bilaterally, continue scheduled Xopenex - added Pulmicort, Brovana, continue IV Solu-Medrol 80 mg every 12 hours  Active Problems:   Tobacco abuse -Counseled on smoking cessation, placed on nicotine patch    Acute on chronic anemia, iron deficiency -Anemia panel showed normal folate, B12, ferritin 5, TIBC 522, iron less than 5 -FOBT positive -Hold Xarelto, gastroenterology consulted, d/w Dr. Levora Angel.  Per patient he was supposed to see [facility] GI outpatient today for anemia.    Essential hypertension -BP stable,  continue Cardizem CD, Lasix    Chronic diastolic CHF (congestive heart failure) -2D echo 04/2022 had shown EF 50 to 55%, indeterminate diastolic parameters, no significant valvular pathology -Currently stable, continue Lasix 40 mg daily    Paroxysmal atrial fibrillation -Hold Xarelto -Rate controlled, continue Cardizem    Rheumatoid arthritis -Continue Plaquenil   Obesity Estimated body mass index is 38.42 kg/m as calculated from the following:   Height as of this encounter:  (1.753 m).   Weight as of this encounter: 118 kg.  Code Status: Full code DVT Prophylaxis:  rivaroxaban (XARELTO) tablet 20 mg Start: 10/16/22 1700 SCDs Start: 10/15/22 2227 rivaroxaban (XARELTO) tablet 20 mg   Level of Care: Level of care: Telemetry Medical Family Communication: Updated patient' Disposition Plan:      Remains inpatient appropriate:      Procedures:    Consultants:   Gastroenterology  Antimicrobials:   Anti-infectives (From admission, onward)    Start     Dose/Rate Route Frequency Ordered Stop   10/16/22 1000  hydroxychloroquine (PLAQUENIL) tablet 200 mg        200 mg Oral Daily 10/16/22 0200  10/15/22 2045  cefTRIAXone (ROCEPHIN) 1 g in sodium chloride 0.9 % 100 mL IVPB        1 g 200 mL/hr over 30 Minutes Intravenous  Once 10/15/22 2044 10/15/22 2152   10/15/22 2045  azithromycin (ZITHROMAX) 500 mg in sodium chloride 0.9 % 250 mL IVPB        500 mg 250 mL/hr over 60 Minutes Intravenous  Once 10/15/22 2044 10/15/22 2258          Medications  amiodarone  200 mg Oral Daily   arformoterol  15 mcg Nebulization BID   budesonide (PULMICORT) nebulizer solution  0.25 mg Nebulization BID   diltiazem  120 mg Oral Daily   furosemide  40 mg Oral Daily   hydroxychloroquine  200 mg Oral Daily   ipratropium  0.5 mg Nebulization Q6H   levalbuterol  1.25 mg Nebulization Q6H   methylPREDNISolone (SOLU-MEDROL) injection  80 mg Intravenous Q12H   pantoprazole  40 mg Oral  Q0600   potassium chloride SA  20 mEq Oral Daily   rivaroxaban  20 mg Oral Q supper      Subjective:   Zacari Stiff was seen and examined today.  Diffusely wheezing bilaterally, feeling tired, hungry.  On O2 2 L.  No acute chest pain, nausea vomiting, abdominal pain.   Objective:   Vitals:   10/16/22 1045 10/16/22 1050 10/16/22 1055 10/16/22 1100  BP:      Pulse: 62 (!) 101 (!) 112 (!) 120  Resp:    (!) 24  Temp:    99.1 F (37.3 C)  TempSrc:      SpO2: 97% 98% 92% 100%  Weight:      Height:        Intake/Output Summary (Last 24 hours) at 10/16/2022 1109 Last data filed at 10/15/2022 2258 Gross per 24 hour  Intake 350.77 ml  Output --  Net 350.77 ml     Wt Readings from Last 3 Encounters:  10/15/22 118 kg  10/09/22 118 kg  09/25/22 116.3 kg     Exam General: Alert and oriented x 3, NAD Cardiovascular: S1 S2 auscultated, irregularly irregular Respiratory: Bilateral expiratory wheezing Gastrointestinal: Soft, nontender, nondistended, + bowel sounds Ext: no pedal edema bilaterally Neuro: no new deficits Skin: No rashes Psych: Normal affect    Data Reviewed:  I have personally reviewed following labs    CBC Lab Results  Component Value Date   WBC 5.7 10/16/2022   RBC 3.47 (L) 10/16/2022   HGB 9.9 (L) 10/16/2022   HCT 29.0 (L) 10/16/2022   MCV 86.3 10/16/2022   MCH 24.1 (L) 10/16/2022   PLT 225 10/16/2022   MCHC 27.9 (L) 10/16/2022   RDW 18.2 (H) 10/16/2022   LYMPHSABS 0.5 (L) 10/16/2022   MONOABS 0.2 10/16/2022   EOSABS 0.0 10/16/2022   BASOSABS 0.1 10/16/2022     Last metabolic panel Lab Results  Component Value Date   NA 138 10/16/2022   K 5.1 10/16/2022   CL 100 10/16/2022   CO2 29 10/16/2022   BUN 12 10/16/2022   CREATININE 0.79 10/16/2022   GLUCOSE 125 (H) 10/16/2022   GFRNONAA >60 10/16/2022   GFRAA 105 02/21/2020   CALCIUM 8.4 (L) 10/16/2022   PHOS 3.0 10/16/2022   PROT 7.0 10/16/2022   ALBUMIN 3.3 (L) 10/16/2022   LABGLOB  2.7 04/29/2022   AGRATIO 1.7 04/29/2022   BILITOT <0.1 (L) 10/16/2022   ALKPHOS 67 10/16/2022   AST 20 10/16/2022   ALT 19 10/16/2022  ANIONGAP 9 10/16/2022    CBG (last 3)  No results for input(s): "GLUCAP" in the last 72 hours.    Coagulation Profile: Recent Labs  Lab 10/16/22 0212  INR 1.1     Radiology Studies: I have personally reviewed the imaging studies  DG Chest 1 View  Result Date: 10/15/2022 CLINICAL DATA:  Shortness of breath.  Chest pain. EXAM: CHEST  1 VIEW COMPARISON:  03/18/2018 and cardiac CT 07/14/2022 FINDINGS: Atherosclerotic calcification of the aortic arch. Mild enlargement of the cardiopericardial silhouette. Indistinct pulmonary vasculature with upper zone pulmonary vascular prominence favoring pulmonary venous hypertension. No overt edema. Suspected mild central airway thickening. No airspace opacity. No blunting of the costophrenic angles. No substantial bony abnormality noted. IMPRESSION: 1. Mild enlargement of the cardiopericardial silhouette with pulmonary venous hypertension but no overt edema. 2. Airway thickening is present, suggesting bronchitis or reactive airways disease. 3.  Aortic Atherosclerosis (ICD10-I70.0). Electronically Signed   By: Gaylyn Rong M.D.   On: 10/15/2022 17:18       Lezli Danek M.D. [facility] 10/16/2022, 11:09 AM  Available via Epic secure chat 7am-7pm After 7 pm, please refer to night coverage provider listed on amion.

## 2022-10-16 NOTE — Consult Note (Signed)
Referring Provider: [facility] Primary Care Physician:  Joycelyn Rua, MD Primary Gastroenterologist:  Deboraha Sprang  Reason for Consultation: Acute respiratory failure, anemia, heme positive stool  HPI: Brian [name] is a 72 y.o. male with past medical history of paroxysmal atrial fibrillation on chronic anticoagulation with Xarelto, history of bladder cancer currently on chemotherapy, history of chronic anemia and CHF presented to the hospital with shortness of breath.  Was found to have bronchitis with bronchospasm and hypoxemia on admission.  He was also found to have mild anemia with hemoglobin of 8.5.  Occult blood positive.  GI is consulted for further evaluation.  His hemoglobin was 8.4 one-week ago.  Patient seen and examined at bedside.  According to him, his breathing is still not better.  He denies any GI symptoms.  Denies abdominal pain, nausea or vomiting.  Denies seeing any blood in the stool or black stool.  Denies diarrhea or constipation.   EGD in June 2020 showed gastritis.  Biopsies were negative for H. pylori.  Noscapine June 2020 showed 10 mm tubular adenoma in the rectosigmoid colon and 10 mm hyperplastic polyp in the rectum.  Repeat colon was recommended in 3 years.   Past Medical History:  Diagnosis Date   Anemia    none since 2019   Anxiety    Atrial flutter    a. 08/2012   CHF (congestive heart failure)    chronic diastolic chf, pt denies   GERD (gastroesophageal reflux disease)    Gout    pt denies   History of kidney stones    passed   Hypertension    Obesity    Paroxysmal atrial fibrillation    Pneumonia 09/2016   Rheumatoid arthritis    "a little bit qwhere" (07/22/2017)   Shortness of breath    with heavy  exertion   Tobacco abuse     Past Surgical History:  Procedure Laterality Date   A-FLUTTER ABLATION N/A 12/29/2016   Procedure: A-Flutter Ablation;  Surgeon: Hillis Range, MD;  Location: [facility] INVASIVE CV LAB;  Service: Cardiovascular;  Laterality: N/A;    ATRIAL FIBRILLATION ABLATION N/A 07/20/2022   Procedure: ATRIAL FIBRILLATION ABLATION;  Surgeon: Maurice Small, MD;  Location: [facility] INVASIVE CV LAB;  Service: Cardiovascular;  Laterality: N/A;   CARDIOVERSION N/A 11/05/2016   Procedure: CARDIOVERSION;  Surgeon: Quintella Reichert, MD;  Location: [facility] ENDOSCOPY;  Service: Cardiovascular;  Laterality: N/A;   CARDIOVERSION N/A 06/09/2022   Procedure: CARDIOVERSION;  Surgeon: Jake Bathe, MD;  Location: [facility] ENDOSCOPY;  Service: Cardiovascular;  Laterality: N/A;   CARDIOVERSION N/A 09/14/2022   Procedure: CARDIOVERSION;  Surgeon: Christell Constant, MD;  Location: [facility] ENDOSCOPY;  Service: Cardiovascular;  Laterality: N/A;   IR THORACENTESIS ASP PLEURAL SPACE W/IMG GUIDE  10/01/2016   JOINT REPLACEMENT     Right hip and left knee   MICROLARYNGOSCOPY  09/02/2007   with excision of right vocal cord mass, Dr. Pollyann Kennedy   PROSTATE BIOPSY N/A 02/22/2020   Procedure: BIOPSY TRANSRECTAL ULTRASONIC PROSTATE (TUBP);  Surgeon: Bjorn Pippin, MD;  Location[facility];  Service: Urology;  Laterality: N/A;   TOTAL HIP ARTHROPLASTY Right 11/17/2017   Procedure: RIGHT TOTAL HIP ARTHROPLASTY ANTERIOR APPROACH;  Surgeon: Tarry Kos, MD;  Location: [facility] OR;  Service: Orthopedics;  Laterality: Right;   TOTAL HIP ARTHROPLASTY Left 03/07/2018   Procedure: LEFT TOTAL HIP ARTHROPLASTY ANTERIOR APPROACH;  Surgeon: Tarry Kos, MD;  Location: [facility] OR;  Service: Orthopedics;  Laterality: Left;  TOTAL KNEE ARTHROPLASTY Left 07/22/2017   Procedure: LEFT TOTAL KNEE ARTHROPLASTY;  Surgeon: Tarry Kos, MD;  Location: [facility] OR;  Service: Orthopedics;  Laterality: Left;   TRANSURETHRAL RESECTION OF BLADDER TUMOR N/A 04/02/2020   Procedure: RESTAGING TRANSURETHRAL RESECTION OF BLADDER TUMOR (TURBT);  Surgeon: Bjorn Pippin, MD;  Location: [facility];  Service: Urology;  Laterality: N/A;  GENERAL ANESTHESIA WIHT PARALYSIS   TRANSURETHRAL RESECTION OF BLADDER TUMOR  WITH MITOMYCIN-C N/A 02/22/2020   Procedure: TRANSURETHRAL RESECTION OF BLADDER TUMOR WITH   GEMCITABINE;  Surgeon: Bjorn Pippin, MD;  Location: [facility];  Service: Urology;  Laterality: N/A;    Prior to Admission medications   Medication Sig Start Date End Date Taking? Authorizing Provider  acetaminophen (TYLENOL) 500 MG tablet Take 500 mg by mouth as needed (For Headache).   Yes [provider]  amiodarone (PACERONE) 200 MG tablet Take 1 tablet (200 mg total) by mouth daily. 09/08/22  Yes Newman Nip, NP  diltiazem (CARDIZEM CD) 120 MG 24 hr capsule Take 1 capsule (120 mg total) by mouth daily. 01/08/22  Yes Jake Bathe, MD  folic acid (FOLVITE) 1 MG tablet Take 1 mg by mouth daily. 02/14/22  Yes [provider]  furosemide (LASIX) 40 MG tablet Take 1 tablet (40 mg total) by mouth daily. 01/19/22  Yes Jake Bathe, MD  golimumab (SIMPONI ARIA) 50 MG/4ML SOLN injection Inject 50 mg into the vein every 8 (eight) weeks.   Yes [provider]  hydroxychloroquine (PLAQUENIL) 200 MG tablet Take 200 mg by mouth daily.   Yes [provider]  KLOR-CON M20 20 MEQ tablet TAKE 1 TABLET BY MOUTH EVERY DAY 01/21/22  Yes Jake Bathe, MD  methotrexate (RHEUMATREX) 2.5 MG tablet Take 25 mg by mouth every Friday. 10 tablets 1 time a week on Fridays in the evening 04/26/17  Yes [provider]  rivaroxaban (XARELTO) 20 MG TABS tablet TAKE 1 TABLET BY MOUTH DAILY WITH SUPPER. 09/28/22  Yes Mealor, Roberts Gaudy, MD  tamsulosin (FLOMAX) 0.4 MG CAPS capsule Take 0.4 mg by mouth at bedtime.   Yes [provider]  pantoprazole (PROTONIX) 40 MG tablet Take 1 tablet (40 mg total) by mouth daily. Patient not taking: Reported on 10/16/2022 07/20/22 10/09/22  Sheilah Pigeon, PA-C    Scheduled Meds:  amiodarone  200 mg Oral Daily   arformoterol  15 mcg Nebulization BID   budesonide (PULMICORT) nebulizer solution  0.25 mg Nebulization BID    diltiazem  120 mg Oral Daily   furosemide  40 mg Oral Daily   hydroxychloroquine  200 mg Oral Daily   ipratropium  0.5 mg Nebulization Q6H   levalbuterol  1.25 mg Nebulization Q6H   methylPREDNISolone (SOLU-MEDROL) injection  80 mg Intravenous Q12H   pantoprazole  40 mg Oral Q0600   potassium chloride SA  20 mEq Oral Daily   rivaroxaban  20 mg Oral Q supper   Continuous Infusions: PRN Meds:.acetaminophen **OR** acetaminophen, levalbuterol, melatonin, nicotine, ondansetron (ZOFRAN) IV  Allergies as of 10/15/2022 - Review Complete 10/15/2022  Allergen Reaction Noted   Aquacel-ag surgical hydrofiber [wound dressings] Other (See Comments) 04/29/2022   Tape Rash and Other (See Comments) 09/30/2016    Family History  Problem Relation Age of Onset   Cancer Mother    Heart disease Father    Heart attack Father    Cancer Father    Diabetes Sister     Social History  Socioeconomic History   Marital status: Divorced    Spouse name: Not on file   Number of children: 3   Years of education: Not on file   Highest education level: Not on file  Occupational History   Not on file  Tobacco Use   Smoking status: Every Day    Packs/day: 0.50    Years: 45.00    Additional pack years: 0.00    Total pack years: 22.50    Types: Cigarettes   Smokeless tobacco: Never   Tobacco comments:    1/2ppd 09/25/22  Vaping Use   Vaping Use: Never used  Substance and Sexual Activity   Alcohol use: Yes    Comment: 07/22/2017 "nothing since 2016"   Drug use: No    Comment: 07/22/2017 "none since 11/2016" cocaine (sister is not aware)   Sexual activity: Not Currently  Other Topics Concern   Not on file  Social History Narrative   Lives in [place]    Works at a convenience store   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   Divorced with 3 children and 9 grandchildren, he states ex-wife is still a friend   International aid/development worker of Community education officer: Not on file  Food Insecurity: Not on  file  Transportation Needs: Not on file  Physical Activity: Not on file  Stress: Not on file  Social Connections: Not on file  Intimate Partner Violence: Not on file    Review of Systems: All negative except as stated above in HPI.  Physical Exam: Vital signs: Vitals:   10/16/22 1055 10/16/22 1100  BP:    Pulse: (!) 112 (!) 120  Resp:    Temp:    SpO2: 92% 100%     General:   Alert,  Well-developed, well-nourished, pleasant and cooperative in NAD Lungs: No visible respiratory distress noted, decreased air entry bilaterally Heart: Tachycardia Abdomen: Soft, nontender, nondistended, bowel sounds present, no peritoneal signs Alert and oriented x 3 Mood and affect normal Rectal:  Deferred  GI:  Lab Results: Recent Labs    10/15/22 1647 10/16/22 0142 10/16/22 0208  WBC 6.0 5.7  --   HGB 8.5* 8.3* 9.9*  HCT 30.1* 29.7* 29.0*  PLT 250 225  --    BMET Recent Labs    10/15/22 1647 10/16/22 0142 10/16/22 0208  NA 135 138 138  K 4.5 5.0 5.1  CL 97* 100  --   CO2 29 29  --   GLUCOSE 131* 125*  --   BUN 12 12  --   CREATININE 0.76 0.79  --   CALCIUM 8.4* 8.4*  --    LFT Recent Labs    10/16/22 0142  PROT 7.0  ALBUMIN 3.3*  AST 20  ALT 19  ALKPHOS 67  BILITOT <0.1*   PT/INR Recent Labs    10/16/22 0212  LABPROT 13.8  INR 1.1     Studies/Results: DG Chest 1 View  Result Date: 10/15/2022 CLINICAL DATA:  Shortness of breath.  Chest pain. EXAM: CHEST  1 VIEW COMPARISON:  03/18/2018 and cardiac CT 07/14/2022 FINDINGS: Atherosclerotic calcification of the aortic arch. Mild enlargement of the cardiopericardial silhouette. Indistinct pulmonary vasculature with upper zone pulmonary vascular prominence favoring pulmonary venous hypertension. No overt edema. Suspected mild central airway thickening. No airspace opacity. No blunting of the costophrenic angles. No substantial bony abnormality noted. IMPRESSION: 1. Mild enlargement of the cardiopericardial  silhouette with pulmonary venous hypertension but no overt edema. 2. Airway thickening is present,  suggesting bronchitis or reactive airways disease. 3.  Aortic Atherosclerosis (ICD10-I70.0). Electronically Signed   By: Gaylyn Rong M.D.   On: 10/15/2022 17:18    Impression/Plan: -Acute on chronic anemia inpatient with no overt bleeding.  Positive stool.  Looks like progressive anemia over the last 4 months. -Acute hypoxic respiratory failure with bronchospasm.  -History of bladder cancer -H/O atrial fibrillation.  Was on Xarelto. -History of tubular adenoma.  Due for repeat colonoscopy.   Recommendations ------------------------- -No plan for inpatient endoscopic procedure at this time with ongoing respiratory issues. -Okay to continue anticoagulation from GI standpoint -Continue Protonix 40 mg once a day for now -He will eventually need EGD and colonoscopy for evaluation of his anemia and heme positive stool, which can be done as an outpatient once acute issues are resolved. -GI will follow    LOS: 1 day   Kathi Der  MD, FACP 10/16/2022, 11:05 AM  Contact #  [phone]

## 2022-10-16 NOTE — ED Notes (Signed)
BNP and ~20 RVP panel added on, confirmed with lab

## 2022-10-16 NOTE — ED Notes (Signed)
ED TO INPATIENT HANDOFF REPORT  ED Nurse Name and Phone #: Al Corpus, RN [phone]  S Name/Age/Gender Brian [name] 72 y.o. male Room/Bed: 010C/010C  Code Status   Code Status: Full Code  Home/SNF/Other Home Patient oriented to: self, place, time, and situation  Is this baseline? Yes   Triage Complete: Triage complete  Chief Complaint Acute bronchitis with bronchospasm [J20.9]  Triage Note Pt c/o bilateral chest pain and shortness of breath worsening in the past several days. Pt also states he was told his hemoglobin was low recently. Pt 84% on room air in triage.    Allergies Allergies  Allergen Reactions   Aquacel-Ag Surgical Hydrofiber [Wound Dressings] Other (See Comments)    Tears skin   Tape Rash and Other (See Comments)    Paper tape is preferred, please!! Adhesive tape tears skin    Level of Care/Admitting Diagnosis ED Disposition     ED Disposition  Admit   Condition  --   Comment  H[facility] [[id number]]  Level of Care: Telemetry Medical [104]  May admit patient to [facility] or [facility] if equivalent level of care is available:: No  Covid Evaluation: Asymptomatic - no recent exposure (last 10 days) testing not required  Diagnosis: Acute bronchitis with bronchospasm [454098]  Admitting Physician: Angie Fava [[id number]]  Attending Physician: Angie Fava [[id number]]  Certification:: I certify this patient will need inpatient services for at least 2 midnights  Estimated Length of Stay: 2          B Medical/Surgery History Past Medical History:  Diagnosis Date   Anemia    none since 2019   Anxiety    Atrial flutter    a. 08/2012   CHF (congestive heart failure)    chronic diastolic chf, pt denies   GERD (gastroesophageal reflux disease)    Gout    pt denies   History of kidney stones    passed   Hypertension    Obesity    Paroxysmal atrial fibrillation    Pneumonia 09/2016   Rheumatoid  arthritis    "a little bit qwhere" (07/22/2017)   Shortness of breath    with heavy  exertion   Tobacco abuse    Past Surgical History:  Procedure Laterality Date   A-FLUTTER ABLATION N/A 12/29/2016   Procedure: A-Flutter Ablation;  Surgeon: Hillis Range, MD;  [facility];  Service: Cardiovascular;  Laterality: N/A;   ATRIAL FIBRILLATION ABLATION N/A 07/20/2022   Procedure: ATRIAL FIBRILLATION ABLATION;  Surgeon: Maurice Small, MD;  [facility]: [facility];  Service: Cardiovascular;  Laterality: N/A;   CARDIOVERSION N/A 11/05/2016   Procedure: CARDIOVERSION;  Surgeon: Quintella Reichert, MD;  Location: [facility] ENDOSCOPY;  Service: Cardiovascular;  Laterality: N/A;   CARDIOVERSION N/A 06/09/2022   Procedure: CARDIOVERSION;  Surgeon: Jake Bathe, MD;  Location: [facility] ENDOSCOPY;  Service: Cardiovascular;  Laterality: N/A;   CARDIOVERSION N/A 09/14/2022   Procedure: CARDIOVERSION;  Surgeon: Christell Constant, MD;  Location: [facility] ENDOSCOPY;  Service: Cardiovascular;  Laterality: N/A;   IR THORACENTESIS ASP PLEURAL SPACE W/IMG GUIDE  10/01/2016   JOINT REPLACEMENT     Right hip and left knee   MICROLARYNGOSCOPY  09/02/2007   with excision of right vocal cord mass, Dr. Pollyann Kennedy   PROSTATE BIOPSY N/A 02/22/2020   Procedure: BIOPSY TRANSRECTAL ULTRASONIC PROSTATE (TUBP);  Surgeon: Bjorn Pippin, MD;  Location: [facility];  Service: Urology;  Laterality: N/A;  TOTAL HIP ARTHROPLASTY Right 11/17/2017   Procedure: RIGHT TOTAL HIP ARTHROPLASTY ANTERIOR APPROACH;  Surgeon: Tarry Kos, MD;  [facility] OR;  Service: Orthopedics;  Laterality: Right;   TOTAL HIP ARTHROPLASTY Left 03/07/2018   Procedure: LEFT TOTAL HIP ARTHROPLASTY ANTERIOR APPROACH;  Surgeon: Tarry Kos, MD;  [facility]: [facility] OR;  Service: Orthopedics;  Laterality: Left;   TOTAL KNEE ARTHROPLASTY Left 07/22/2017   Procedure: LEFT TOTAL KNEE ARTHROPLASTY;  Surgeon: Tarry Kos, MD;  [facility] OR;  Service:  Orthopedics;  Laterality: Left;   TRANSURETHRAL RESECTION OF BLADDER TUMOR N/A 04/02/2020   Procedure: RESTAGING TRANSURETHRAL RESECTION OF BLADDER TUMOR (TURBT);  Surgeon: Bjorn Pippin, MD;  Location: [facility];  Service: Urology;  Laterality: N/A;  GENERAL ANESTHESIA WIHT PARALYSIS   TRANSURETHRAL RESECTION OF BLADDER TUMOR WITH MITOMYCIN-C N/A 02/22/2020   Procedure: TRANSURETHRAL RESECTION OF BLADDER TUMOR WITH   GEMCITABINE;  Surgeon: Bjorn Pippin, MD;  Location: [facility];  Service: Urology;  Laterality: N/A;     A IV Location/Drains/Wounds Patient Lines/Drains/Airways Status     Active Line/Drains/Airways     Name Placement date Placement time Site Days   Peripheral IV 10/15/22 20 G Right Forearm 10/15/22  2041  Forearm  1            Intake/Output Last 24 hours  Intake/Output Summary (Last 24 hours) at 10/16/2022 1057 Last data filed at 10/15/2022 2258 Gross per 24 hour  Intake 350.77 ml  Output --  Net 350.77 ml    Labs/Imaging Results for orders placed or performed during the hospital encounter of 10/15/22 (from the past 48 hour(s))  Resp panel by RT-PCR (RSV, Flu A&B, Covid) Anterior Nasal Swab     Status: None   Collection Time: 10/15/22  4:21 PM   Specimen: Anterior Nasal Swab  Result Value Ref Range   SARS Coronavirus 2 by RT PCR NEGATIVE NEGATIVE   Influenza A by PCR NEGATIVE NEGATIVE   Influenza B by PCR NEGATIVE NEGATIVE    Comment: (NOTE) The Xpert Xpress SARS-CoV-2/FLU/RSV plus assay is intended as an aid in the diagnosis of influenza from Nasopharyngeal swab specimens and should not be used as a sole basis for treatment. Nasal washings and aspirates are unacceptable for Xpert Xpress SARS-CoV-2/FLU/RSV testing.  Fact Sheet for Patients: BloggerCourse.com  Fact Sheet for Healthcare Providers: SeriousBroker.it  This test is not yet approved or cleared by the [place]  FDA and has been authorized for detection and/or diagnosis of SARS-CoV-2 by FDA under an Emergency Use Authorization (EUA). This EUA will remain in effect (meaning this test can be used) for the duration of the COVID-19 declaration under Section 564(b)(1) of the Act, 21 U.S.C. section 360bbb-3(b)(1), unless the authorization is terminated or revoked.     Resp Syncytial Virus by PCR NEGATIVE NEGATIVE    Comment: (NOTE) Fact Sheet for Patients: BloggerCourse.com  Fact Sheet for Healthcare Providers: SeriousBroker.it  This test is not yet approved or cleared by the [place] FDA and has been authorized for detection and/or diagnosis of SARS-CoV-2 by FDA under an Emergency Use Authorization (EUA). This EUA will remain in effect (meaning this test can be used) for the duration of the COVID-19 declaration under Section 564(b)(1) of the Act, 21 U.S.C. section 360bbb-3(b)(1), unless the authorization is terminated or revoked.  Performed at [facility], [address] [place], [place] [address]   Basic metabolic panel     Status: Abnormal   Collection Time:  10/15/22  4:47 PM  Result Value Ref Range   Sodium 135 135 - 145 mmol/L   Potassium 4.5 3.5 - 5.1 mmol/L   Chloride 97 (L) 98 - 111 mmol/L   CO2 29 22 - 32 mmol/L   Glucose, Bld 131 (H) 70 - 99 mg/dL    Comment: Glucose reference range applies only to samples taken after fasting for at least 8 hours.   BUN 12 8 - 23 mg/dL   Creatinine, Ser 4.09 0.61 - 1.24 mg/dL   Calcium 8.4 (L) 8.9 - 10.3 mg/dL   GFR, Estimated >81 >19 mL/min    Comment: (NOTE) Calculated using the CKD-EPI Creatinine Equation (2021)    Anion gap 9 5 - 15    Comment: Performed at [facility], [address] [place], [place] [address]  CBC with Differential/Platelet     Status: Abnormal   Collection Time: 10/15/22  4:47 PM  Result Value Ref Range   WBC 6.0 4.0 - 10.5 K/uL   RBC 3.54 (L)  4.22 - 5.81 MIL/uL   Hemoglobin 8.5 (L) 13.0 - 17.0 g/dL   HCT 95.6 (L) 21.3 - 08.6 %   MCV 85.0 80.0 - 100.0 fL   MCH 24.0 (L) 26.0 - 34.0 pg   MCHC 28.2 (L) 30.0 - 36.0 g/dL   RDW 57.8 (H) 46.9 - 62.9 %   Platelets 250 150 - 400 K/uL   nRBC 0.3 (H) 0.0 - 0.2 %   Neutrophils Relative % 60 %   Neutro Abs 3.7 1.7 - 7.7 K/uL   Lymphocytes Relative 16 %   Lymphs Abs 1.0 0.7 - 4.0 K/uL   Monocytes Relative 14 %   Monocytes Absolute 0.8 0.1 - 1.0 K/uL   Eosinophils Relative 4 %   Eosinophils Absolute 0.2 0.0 - 0.5 K/uL   Basophils Relative 1 %   Basophils Absolute 0.1 0.0 - 0.1 K/uL   Immature Granulocytes 5 %   Abs Immature Granulocytes 0.30 (H) 0.00 - 0.07 K/uL    Comment: Performed at [facility], [address], [place], [place] [address]  Troponin I (High Sensitivity)     Status: None   Collection Time: 10/15/22  4:47 PM  Result Value Ref Range   Troponin I (High Sensitivity) 11 <18 ng/L    Comment: (NOTE) Elevated high sensitivity troponin I (hsTnI) values and significant  changes across serial measurements may suggest ACS but many other  chronic and acute conditions are known to elevate hsTnI results.  Refer to the "Links" section for chest pain algorithms and additional  guidance. Performed at [facility], [address], [place], [place] [address]   Brain natriuretic peptide     Status: Abnormal   Collection Time: 10/15/22  4:47 PM  Result Value Ref Range   B Natriuretic Peptide 321.4 (H) 0.0 - 100.0 pg/mL    Comment: Performed at [facility], [address] [place], [place] [address]  Troponin I (High Sensitivity)     Status: None   Collection Time: 10/15/22  8:41 PM  Result Value Ref Range   Troponin I (High Sensitivity) 9 <18 ng/L    Comment: (NOTE) Elevated high sensitivity troponin I (hsTnI) values and significant  changes across serial measurements may suggest ACS but many other  chronic and acute conditions are known to elevate hsTnI  results.  Refer to the "Links" section for chest pain algorithms and additional  guidance. Performed at [facility]  Lab, [address] [place], [place] [address]   POC occult blood, ED     Status: Abnormal   Collection Time: 10/15/22  8:47 PM  Result Value Ref Range   Fecal Occult Bld POSITIVE (A) NEGATIVE  CBC with Differential/Platelet     Status: Abnormal   Collection Time: 10/16/22  1:42 AM  Result Value Ref Range   WBC 5.7 4.0 - 10.5 K/uL   RBC 3.44 (L) 4.22 - 5.81 MIL/uL   Hemoglobin 8.3 (L) 13.0 - 17.0 g/dL   HCT 60.4 (L) 54.0 - 98.1 %   MCV 86.3 80.0 - 100.0 fL   MCH 24.1 (L) 26.0 - 34.0 pg   MCHC 27.9 (L) 30.0 - 36.0 g/dL   RDW 19.1 (H) 47.8 - 29.5 %   Platelets 225 150 - 400 K/uL   nRBC 0.4 (H) 0.0 - 0.2 %   Neutrophils Relative % 79 %   Neutro Abs 4.6 1.7 - 7.7 K/uL   Lymphocytes Relative 10 %   Lymphs Abs 0.5 (L) 0.7 - 4.0 K/uL   Monocytes Relative 4 %   Monocytes Absolute 0.2 0.1 - 1.0 K/uL   Eosinophils Relative 1 %   Eosinophils Absolute 0.0 0.0 - 0.5 K/uL   Basophils Relative 1 %   Basophils Absolute 0.1 0.0 - 0.1 K/uL   Immature Granulocytes 5 %   Abs Immature Granulocytes 0.28 (H) 0.00 - 0.07 K/uL    Comment: Performed at [facility], [address], [place], [place] [address]  Comprehensive metabolic panel     Status: Abnormal   Collection Time: 10/16/22  1:42 AM  Result Value Ref Range   Sodium 138 135 - 145 mmol/L   Potassium 5.0 3.5 - 5.1 mmol/L   Chloride 100 98 - 111 mmol/L   CO2 29 22 - 32 mmol/L   Glucose, Bld 125 (H) 70 - 99 mg/dL    Comment: Glucose reference range applies only to samples taken after fasting for at least 8 hours.   BUN 12 8 - 23 mg/dL   Creatinine, Ser 8.65 0.61 - 1.24 mg/dL   Calcium 8.4 (L) 8.9 - 10.3 mg/dL   Total Protein 7.0 6.5 - 8.1 g/dL   Albumin 3.3 (L) 3.5 - 5.0 g/dL   AST 20 15 - 41 U/L   ALT 19 0 - 44 U/L   Alkaline Phosphatase 67 38 - 126 U/L   Total Bilirubin <0.1 (L) 0.3 - 1.2 mg/dL   GFR,  Estimated >78 >46 mL/min    Comment: (NOTE) Calculated using the CKD-EPI Creatinine Equation (2021)    Anion gap 9 5 - 15    Comment: Performed at [facility], [address] [place], [place] [address]  Magnesium     Status: None   Collection Time: 10/16/22  1:42 AM  Result Value Ref Range   Magnesium 1.7 1.7 - 2.4 mg/dL    Comment: Performed at [facility], [address] [place], [place] [address]  Phosphorus     Status: None   Collection Time: 10/16/22  1:42 AM  Result Value Ref Range   Phosphorus 3.0 2.5 - 4.6 mg/dL    Comment: Performed at [facility] Lab, [address], [place], [place] [address]  Procalcitonin     Status: None   Collection Time: 10/16/22  1:42 AM  Result Value Ref Range   Procalcitonin <0.10 ng/mL    Comment:  Interpretation: PCT (Procalcitonin) <= 0.5 ng/mL: Systemic infection (sepsis) is not likely. Local bacterial infection is possible. (NOTE)       Sepsis PCT Algorithm           Lower Respiratory Tract                                      Infection PCT Algorithm    ----------------------------     ----------------------------         PCT < 0.25 ng/mL                PCT < 0.10 ng/mL          Strongly encourage             Strongly discourage   discontinuation of antibiotics    initiation of antibiotics    ----------------------------     -----------------------------       PCT 0.25 - 0.50 ng/mL            PCT 0.10 - 0.25 ng/mL               OR       >80% decrease in PCT            Discourage initiation of                                            antibiotics      Encourage discontinuation           of antibiotics    ----------------------------     -----------------------------         PCT >= 0.50 ng/mL              PCT 0.26 - 0.50 ng/mL               AND        <80% decrease in PCT             Encourage initiation of                                             antibiotics       Encourage continuation           of  antibiotics    ----------------------------     -----------------------------        PCT >= 0.50 ng/mL                  PCT > 0.50 ng/mL               AND         increase in PCT                  Strongly encourage                                      initiation of antibiotics    Strongly encourage escalation           of antibiotics                                     -----------------------------  PCT <= 0.25 ng/mL                                                 OR                                        > 80% decrease in PCT                                      Discontinue / Do not initiate                                             antibiotics  Performed at [facility], [address], [place], [place] [address]   Ferritin     Status: Abnormal   Collection Time: 10/16/22  1:42 AM  Result Value Ref Range   Ferritin 5 (L) 24 - 336 ng/mL    Comment: Performed at [facility], [address], [place], [place] [address]  Iron and TIBC     Status: Abnormal   Collection Time: 10/16/22  1:42 AM  Result Value Ref Range   Iron <5 (L) 45 - 182 ug/dL   TIBC 829 (H) 562 - 130 ug/dL   Saturation Ratios NOT CALCULATED 17.9 - 39.5 %   UIBC NOT CALCULATED ug/dL    Comment: Performed at [facility], [address] [place], [place] [address]  Folate     Status: None   Collection Time: 10/16/22  1:42 AM  Result Value Ref Range   Folate 15.4 >5.9 ng/mL    Comment: Performed at [facility], [address] [place], [place] [address]  Reticulocytes     Status: Abnormal   Collection Time: 10/16/22  1:48 AM  Result Value Ref Range   Retic Ct Pct 1.9 0.4 - 3.1 %   RBC. 3.47 (L) 4.22 - 5.81 MIL/uL   Retic Count, Absolute 64.2 19.0 - 186.0 K/uL   Immature Retic Fract 26.3 (H) 2.3 - 15.9 %    Comment: Performed at [facility], [address], [place], [place] [address]  I-Stat venous blood gas, ED     Status: Abnormal    Collection Time: 10/16/22  2:08 AM  Result Value Ref Range   pH, Ven 7.339 7.25 - 7.43   pCO2, Ven 55.7 44 - 60 mmHg   pO2, Ven 47 (H) 32 - 45 mmHg   Bicarbonate 30.0 (H) 20.0 - 28.0 mmol/L   TCO2 32 22 - 32 mmol/L   O2 Saturation 79 %   Acid-Base Excess 3.0 (H) 0.0 - 2.0 mmol/L   Sodium 138 135 - 145 mmol/L   Potassium 5.1 3.5 - 5.1 mmol/L   Calcium, Ion 1.12 (L) 1.15 - 1.40 mmol/L   HCT 29.0 (L) 39.0 - 52.0 %   Hemoglobin 9.9 (L) 13.0 - 17.0 g/dL   Sample type VENOUS   Vitamin B12     Status: Abnormal   Collection Time: 10/16/22  2:12 AM  Result Value Ref Range  Vitamin B-12 1,121 (H) 180 - 914 pg/mL    Comment: (NOTE) This assay is not validated for testing neonatal or myeloproliferative syndrome specimens for Vitamin B12 levels. Performed at [facility], [address] [place], [place] [address]   Type and screen [facility]     Status: None   Collection Time: 10/16/22  2:12 AM  Result Value Ref Range   ABO/RH(D) A POS    Antibody Screen NEG    Sample Expiration      10/19/2022,2359 Performed at [facility], [address], [place], [place] [address]   Protime-INR     Status: None   Collection Time: 10/16/22  2:12 AM  Result Value Ref Range   Prothrombin Time 13.8 11.4 - 15.2 seconds   INR 1.1 0.8 - 1.2    Comment: (NOTE) INR goal varies based on device and disease states. Performed at [facility], [address], [place], [place] [address]    DG Chest 1 View  Result Date: 10/15/2022 CLINICAL DATA:  Shortness of breath.  Chest pain. EXAM: CHEST  1 VIEW COMPARISON:  03/18/2018 and cardiac CT 07/14/2022 FINDINGS: Atherosclerotic calcification of the aortic arch. Mild enlargement of the cardiopericardial silhouette. Indistinct pulmonary vasculature with upper zone pulmonary vascular prominence favoring pulmonary venous hypertension. No overt edema. Suspected mild central airway thickening. No airspace opacity. No blunting of the  costophrenic angles. No substantial bony abnormality noted. IMPRESSION: 1. Mild enlargement of the cardiopericardial silhouette with pulmonary venous hypertension but no overt edema. 2. Airway thickening is present, suggesting bronchitis or reactive airways disease. 3.  Aortic Atherosclerosis (ICD10-I70.0). Electronically Signed   By: Gaylyn Rong M.D.   On: 10/15/2022 17:18    Pending Labs Unresulted Labs (From admission, onward)     Start     Ordered   10/17/22 0500  CBC  Tomorrow morning,   R        10/16/22 0631   10/17/22 0500  Basic metabolic panel  Daily,   R      10/16/22 0631   10/16/22 0816  Brain natriuretic peptide  ONCE - URGENT,   URGENT        10/16/22 0815   10/16/22 0749  Respiratory (~20 pathogens) panel by PCR  (Respiratory panel by PCR (~20 pathogens, ~24 hr TAT)  w precautions)  Once,   R        10/16/22 0748   10/16/22 0500  Blood gas, venous  Tomorrow morning,   R        10/15/22 2229   10/15/22 2230  Magnesium  Add-on,   AD        10/15/22 2229            Vitals/Pain Today's Vitals   10/16/22 0215 10/16/22 0600 10/16/22 0605 10/16/22 0920  BP:   (!) 126/51   Pulse: 90  74   Resp:   16   Temp:  98.7 F (37.1 C)    TempSrc:  Oral    SpO2: 99%  95%   Weight:      Height:      PainSc:    0-No pain    Isolation Precautions Droplet precaution  Medications Medications  acetaminophen (TYLENOL) tablet 650 mg (has no administration in time range)    Or  acetaminophen (TYLENOL) suppository 650 mg (has no administration in time range)  methylPREDNISolone sodium succinate (SOLU-MEDROL) 125 mg/2 mL injection 80 mg (80 mg Intravenous Given 10/16/22  0920)  levalbuterol (XOPENEX) nebulizer solution 1.25 mg (1.25 mg Nebulization Given 10/16/22 0655)  ipratropium (ATROVENT) nebulizer solution 0.5 mg (0.5 mg Nebulization Given 10/16/22 0655)  levalbuterol (XOPENEX) nebulizer solution 1.25 mg (1.25 mg Nebulization Given 10/16/22 1027)  melatonin tablet 3 mg  (has no administration in time range)  ondansetron (ZOFRAN) injection 4 mg (has no administration in time range)  amiodarone (PACERONE) tablet 200 mg (200 mg Oral Given 10/16/22 0948)  diltiazem (CARDIZEM CD) 24 hr capsule 120 mg (120 mg Oral Given 10/16/22 1025)  furosemide (LASIX) tablet 40 mg (40 mg Oral Given 10/16/22 0948)  hydroxychloroquine (PLAQUENIL) tablet 200 mg (200 mg Oral Given 10/16/22 1025)  potassium chloride SA (KLOR-CON M) CR tablet 20 mEq (20 mEq Oral Given 10/16/22 0948)  rivaroxaban (XARELTO) tablet 20 mg (has no administration in time range)  nicotine (NICODERM CQ - dosed in mg/24 hours) patch 14 mg (has no administration in time range)  budesonide (PULMICORT) nebulizer solution 0.25 mg (0.25 mg Nebulization Given 10/16/22 0950)  arformoterol (BROVANA) nebulizer solution 15 mcg (15 mcg Nebulization Given 10/16/22 0950)  pantoprazole (PROTONIX) EC tablet 40 mg (40 mg Oral Given 10/16/22 0948)  ipratropium-albuterol (DUONEB) 0.5-2.5 (3) MG/3ML nebulizer solution 3 mL (3 mLs Nebulization Given 10/15/22 1630)  ipratropium-albuterol (DUONEB) 0.5-2.5 (3) MG/3ML nebulizer solution 3 mL (3 mLs Nebulization Given 10/15/22 2108)  predniSONE (DELTASONE) tablet 60 mg (60 mg Oral Given 10/15/22 2108)  cefTRIAXone (ROCEPHIN) 1 g in sodium chloride 0.9 % 100 mL IVPB (0 g Intravenous Stopped 10/15/22 2152)  azithromycin (ZITHROMAX) 500 mg in sodium chloride 0.9 % 250 mL IVPB (0 mg Intravenous Stopped 10/15/22 2258)    Mobility walks     Focused Assessments Pulmonary Assessment Handoff:  Lung sounds: Bilateral Breath Sounds: Diminished, Expiratory wheezes O2 Device: Room Air O2 Flow Rate (L/min): 2 L/min    R Recommendations: See Admitting Provider Note  Report given to:   Additional Notes: A&Ox4, ambulatory to b/r, has used urinal, feels better sitting up, no distress, nebs just given 10:27, last hgb 8.3, no blood transfusion given

## 2022-10-17 DIAGNOSIS — F17200 Nicotine dependence, unspecified, uncomplicated: Secondary | ICD-10-CM | POA: Diagnosis present

## 2022-10-17 DIAGNOSIS — D649 Anemia, unspecified: Secondary | ICD-10-CM | POA: Diagnosis not present

## 2022-10-17 DIAGNOSIS — F17219 Nicotine dependence, cigarettes, with unspecified nicotine-induced disorders: Secondary | ICD-10-CM

## 2022-10-17 DIAGNOSIS — J441 Chronic obstructive pulmonary disease with (acute) exacerbation: Secondary | ICD-10-CM | POA: Diagnosis not present

## 2022-10-17 DIAGNOSIS — J40 Bronchitis, not specified as acute or chronic: Secondary | ICD-10-CM | POA: Diagnosis not present

## 2022-10-17 LAB — BASIC METABOLIC PANEL
Anion gap: 9 (ref 5–15)
BUN: 17 mg/dL (ref 8–23)
CO2: 26 mmol/L (ref 22–32)
Calcium: 8.6 mg/dL — ABNORMAL LOW (ref 8.9–10.3)
Chloride: 101 mmol/L (ref 98–111)
Creatinine, Ser: 0.83 mg/dL (ref 0.61–1.24)
GFR, Estimated: >60 mL/min (ref >60)
Glucose, Bld: 145 mg/dL — ABNORMAL HIGH (ref 70–99)
Potassium: 4.5 mmol/L (ref 3.5–5.1)
Sodium: 136 mmol/L (ref 135–145)

## 2022-10-17 LAB — CBC
HCT: 30.1 % — ABNORMAL LOW (ref 39.0–52.0)
Hemoglobin: 8.3 g/dL — ABNORMAL LOW (ref 13.0–17.0)
MCH: 23.4 pg — ABNORMAL LOW (ref 26.0–34.0)
MCHC: 27.6 g/dL — ABNORMAL LOW (ref 30.0–36.0)
MCV: 84.8 fL (ref 80.0–100.0)
Platelets: 171 10*3/uL (ref 150–400)
RBC: 3.55 MIL/uL — ABNORMAL LOW (ref 4.22–5.81)
RDW: 18.4 % — ABNORMAL HIGH (ref 11.5–15.5)
WBC: 7.9 10*3/uL (ref 4.0–10.5)
nRBC: 0.6 % — ABNORMAL HIGH (ref 0.0–0.2)

## 2022-10-17 MED ORDER — LEVALBUTEROL TARTRATE 45 MCG/ACT IN AERO: 1.0000 | INHALATION_SPRAY | RESPIRATORY_TRACT | 2 refills | Status: DC | PRN

## 2022-10-17 MED ORDER — BUDESONIDE-FORMOTEROL FUMARATE 160-4.5 MCG/ACT IN AERO: 2.0000 | INHALATION_SPRAY | Freq: Two times a day (BID) | RESPIRATORY_TRACT | 12 refills | Status: DC

## 2022-10-17 MED ORDER — PANTOPRAZOLE SODIUM 40 MG PO TBEC: 40.0000 mg | DELAYED_RELEASE_TABLET | Freq: Every day | ORAL | 3 refills | Status: AC

## 2022-10-17 MED ORDER — PREDNISONE 10 MG PO TABS: ORAL_TABLET | ORAL | 0 refills | Status: DC

## 2022-10-17 NOTE — Progress Notes (Signed)
Subjective: Patient is dressed to go home. States he has not seen any blood in stool or black stools. He has acid reflux for which she takes over-the-counter PPI/omeprazole.  Objective: Vital signs in last 24 hours: Temp:  [97.2 F (36.2 C)-99.1 F (37.3 C)] 97.2 F (36.2 C) (04/20 0818) Pulse Rate:  [62-120] 73 (04/20 0818) Resp:  [16-24] 18 (04/20 0818) BP: (118-159)/(54-71) 122/71 (04/20 0818) SpO2:  [92 %-100 %] 97 % (04/20 0818) FiO2 (%):  [93 %-100 %] 93 % (04/19 1505) Weight:  [115 kg-116.3 kg] 115 kg (04/20 0522) Weight change: -1.7 kg Last BM Date : 10/15/22  PE: Overweight, mild respiratory distress, able to speak in full sentences without use of accessory muscles of respiration and on room air GENERAL: Mild pallor ABDOMEN: Nondistended, soft, nontender EXTREMITIES: No deformity  Lab Results: Results for orders placed or performed during the hospital encounter of 10/15/22 (from the past 48 hour(s))  Resp panel by RT-PCR (RSV, Flu A&B, Covid) Anterior Nasal Swab     Status: None   Collection Time: 10/15/22  4:21 PM   Specimen: Anterior Nasal Swab  Result Value Ref Range   SARS Coronavirus 2 by RT PCR NEGATIVE NEGATIVE   Influenza A by PCR NEGATIVE NEGATIVE   Influenza B by PCR NEGATIVE NEGATIVE    Comment: (NOTE) The Xpert Xpress SARS-CoV-2/FLU/RSV plus assay is intended as an aid in the diagnosis of influenza from Nasopharyngeal swab specimens and should not be used as a sole basis for treatment. Nasal washings and aspirates are unacceptable for Xpert Xpress SARS-CoV-2/FLU/RSV testing.  Fact Sheet for Patients: BloggerCourse.com  Fact Sheet for Healthcare Providers: SeriousBroker.it  This test is not yet approved or cleared by the [place] FDA and has been authorized for detection and/or diagnosis of SARS-CoV-2 by FDA under an Emergency Use Authorization (EUA). This EUA will remain in effect (meaning  this test can be used) for the duration of the COVID-19 declaration under Section 564(b)(1) of the Act, 21 U.S.C. section 360bbb-3(b)(1), unless the authorization is terminated or revoked.     Resp Syncytial Virus by PCR NEGATIVE NEGATIVE    Comment: (NOTE) Fact Sheet for Patients: BloggerCourse.com  Fact Sheet for Healthcare Providers: SeriousBroker.it  This test is not yet approved or cleared by the [place] FDA and has been authorized for detection and/or diagnosis of SARS-CoV-2 by FDA under an Emergency Use Authorization (EUA). This EUA will remain in effect (meaning this test can be used) for the duration of the COVID-19 declaration under Section 564(b)(1) of the Act, 21 U.S.C. section 360bbb-3(b)(1), unless the authorization is terminated or revoked.  Performed at [facility], [address] [place], [place] [address]   Basic metabolic panel     Status: Abnormal   Collection Time: 10/15/22  4:47 PM  Result Value Ref Range   Sodium 135 135 - 145 mmol/L   Potassium 4.5 3.5 - 5.1 mmol/L   Chloride 97 (L) 98 - 111 mmol/L   CO2 29 22 - 32 mmol/L   Glucose, Bld 131 (H) 70 - 99 mg/dL    Comment: Glucose reference range applies only to samples taken after fasting for at least 8 hours.   BUN 12 8 - 23 mg/dL   Creatinine, Ser 1.02 0.61 - 1.24 mg/dL   Calcium 8.4 (L) 8.9 - 10.3 mg/dL   GFR, Estimated >72 >53 mL/min    Comment: (NOTE) Calculated using the CKD-EPI Creatinine Equation (2021)    Anion gap 9 5 -  15    Comment: Performed at [facility], [address], [place], [place] [address]  CBC with Differential/Platelet     Status: Abnormal   Collection Time: 10/15/22  4:47 PM  Result Value Ref Range   WBC 6.0 4.0 - 10.5 K/uL   RBC 3.54 (L) 4.22 - 5.81 MIL/uL   Hemoglobin 8.5 (L) 13.0 - 17.0 g/dL   HCT 60.4 (L) 54.0 - 98.1 %   MCV 85.0 80.0 - 100.0 fL   MCH 24.0 (L) 26.0 - 34.0 pg   MCHC 28.2 (L) 30.0  - 36.0 g/dL   RDW 19.1 (H) 47.8 - 29.5 %   Platelets 250 150 - 400 K/uL   nRBC 0.3 (H) 0.0 - 0.2 %   Neutrophils Relative % 60 %   Neutro Abs 3.7 1.7 - 7.7 K/uL   Lymphocytes Relative 16 %   Lymphs Abs 1.0 0.7 - 4.0 K/uL   Monocytes Relative 14 %   Monocytes Absolute 0.8 0.1 - 1.0 K/uL   Eosinophils Relative 4 %   Eosinophils Absolute 0.2 0.0 - 0.5 K/uL   Basophils Relative 1 %   Basophils Absolute 0.1 0.0 - 0.1 K/uL   Immature Granulocytes 5 %   Abs Immature Granulocytes 0.30 (H) 0.00 - 0.07 K/uL    Comment: Performed at [facility], [address], [place], [place] [address]  Troponin I (High Sensitivity)     Status: None   Collection Time: 10/15/22  4:47 PM  Result Value Ref Range   Troponin I (High Sensitivity) 11 <18 ng/L    Comment: (NOTE) Elevated high sensitivity troponin I (hsTnI) values and significant  changes across serial measurements may suggest ACS but many other  chronic and acute conditions are known to elevate hsTnI results.  Refer to the "Links" section for chest pain algorithms and additional  guidance. Performed at [facility], [address], [place], [place] [address]   Brain natriuretic peptide     Status: Abnormal   Collection Time: 10/15/22  4:47 PM  Result Value Ref Range   B Natriuretic Peptide 321.4 (H) 0.0 - 100.0 pg/mL    Comment: Performed at [facility], [address], [place], [place] [address]  Troponin I (High Sensitivity)     Status: None   Collection Time: 10/15/22  8:41 PM  Result Value Ref Range   Troponin I (High Sensitivity) 9 <18 ng/L    Comment: (NOTE) Elevated high sensitivity troponin I (hsTnI) values and significant  changes across serial measurements may suggest ACS but many other  chronic and acute conditions are known to elevate hsTnI results.  Refer to the "Links" section for chest pain algorithms and additional  guidance. Performed at [facility], [address] [place] [address] [place], [place] [address]    POC occult blood, ED     Status: Abnormal   Collection Time: 10/15/22  8:47 PM  Result Value Ref Range   Fecal Occult Bld POSITIVE (A) NEGATIVE  CBC with Differential/Platelet     Status: Abnormal   Collection Time: 10/16/22  1:42 AM  Result Value Ref Range   WBC 5.7 4.0 - 10.5 K/uL   RBC 3.44 (L) 4.22 - 5.81 MIL/uL   Hemoglobin 8.3 (L) 13.0 - 17.0 g/dL   HCT 13.2 (L) 44.0 - 10.2 %   MCV 86.3 80.0 - 100.0 fL   MCH 24.1 (L) 26.0 - 34.0 pg   MCHC 27.9 (L) 30.0 - 36.0  g/dL   RDW 16.1 (H) 09.6 - 04.5 %   Platelets 225 150 - 400 K/uL   nRBC 0.4 (H) 0.0 - 0.2 %   Neutrophils Relative % 79 %   Neutro Abs 4.6 1.7 - 7.7 K/uL   Lymphocytes Relative 10 %   Lymphs Abs 0.5 (L) 0.7 - 4.0 K/uL   Monocytes Relative 4 %   Monocytes Absolute 0.2 0.1 - 1.0 K/uL   Eosinophils Relative 1 %   Eosinophils Absolute 0.0 0.0 - 0.5 K/uL   Basophils Relative 1 %   Basophils Absolute 0.1 0.0 - 0.1 K/uL   Immature Granulocytes 5 %   Abs Immature Granulocytes 0.28 (H) 0.00 - 0.07 K/uL    Comment: Performed at [facility], [address], [place], [place] [address]  Comprehensive metabolic panel     Status: Abnormal   Collection Time: 10/16/22  1:42 AM  Result Value Ref Range   Sodium 138 135 - 145 mmol/L   Potassium 5.0 3.5 - 5.1 mmol/L   Chloride 100 98 - 111 mmol/L   CO2 29 22 - 32 mmol/L   Glucose, Bld 125 (H) 70 - 99 mg/dL    Comment: Glucose reference range applies only to samples taken after fasting for at least 8 hours.   BUN 12 8 - 23 mg/dL   Creatinine, Ser 1.91 0.61 - 1.24 mg/dL   Calcium 8.4 (L) 8.9 - 10.3 mg/dL   Total Protein 7.0 6.5 - 8.1 g/dL   Albumin 3.3 (L) 3.5 - 5.0 g/dL   AST 20 15 - 41 U/L   ALT 19 0 - 44 U/L   Alkaline Phosphatase 67 38 - 126 U/L   Total Bilirubin <0.1 (L) 0.3 - 1.2 mg/dL   GFR, Estimated >47 >82 mL/min    Comment: (NOTE) Calculated using the CKD-EPI Creatinine Equation (2021)    Anion gap 9 5 - 15    Comment: Performed at [facility],  [address] [place], [place] [address]  Magnesium     Status: None   Collection Time: 10/16/22  1:42 AM  Result Value Ref Range   Magnesium 1.7 1.7 - 2.4 mg/dL    Comment: Performed at [facility], [address] [place], [place] [address]  Phosphorus     Status: None   Collection Time: 10/16/22  1:42 AM  Result Value Ref Range   Phosphorus 3.0 2.5 - 4.6 mg/dL    Comment: Performed at [facility], [address] [place], [place] [address]  Procalcitonin     Status: None   Collection Time: 10/16/22  1:42 AM  Result Value Ref Range   Procalcitonin <0.10 ng/mL    Comment:        Interpretation: PCT (Procalcitonin) <= 0.5 ng/mL: Systemic infection (sepsis) is not likely. Local bacterial infection is possible. (NOTE)       Sepsis PCT Algorithm           Lower Respiratory Tract                                      Infection PCT Algorithm    ----------------------------     ----------------------------         PCT < 0.25 ng/mL                PCT < 0.10 ng/mL  Strongly encourage             Strongly discourage   discontinuation of antibiotics    initiation of antibiotics    ----------------------------     -----------------------------       PCT 0.25 - 0.50 ng/mL            PCT 0.10 - 0.25 ng/mL               OR       >80% decrease in PCT            Discourage initiation of                                            antibiotics      Encourage discontinuation           of antibiotics    ----------------------------     -----------------------------         PCT >= 0.50 ng/mL              PCT 0.26 - 0.50 ng/mL               AND        <80% decrease in PCT             Encourage initiation of                                             antibiotics       Encourage continuation           of antibiotics    ----------------------------     -----------------------------        PCT >= 0.50 ng/mL                  PCT > 0.50 ng/mL               AND         increase in  PCT                  Strongly encourage                                      initiation of antibiotics    Strongly encourage escalation           of antibiotics                                     -----------------------------                                           PCT <= 0.25 ng/mL                                                 OR                                        >  80% decrease in PCT                                      Discontinue / Do not initiate                                             antibiotics  Performed at [facility], [address], [place], [place] [address]   Ferritin     Status: Abnormal   Collection Time: 10/16/22  1:42 AM  Result Value Ref Range   Ferritin 5 (L) 24 - 336 ng/mL    Comment: Performed at [facility], [address], [place], [place] [address]  Iron and TIBC     Status: Abnormal   Collection Time: 10/16/22  1:42 AM  Result Value Ref Range   Iron <5 (L) 45 - 182 ug/dL   TIBC 098 (H) 119 - 147 ug/dL   Saturation Ratios NOT CALCULATED 17.9 - 39.5 %   UIBC NOT CALCULATED ug/dL    Comment: Performed at [facility], [address], [place], [place] [address]  Folate     Status: None   Collection Time: 10/16/22  1:42 AM  Result Value Ref Range   Folate 15.4 >5.9 ng/mL    Comment: Performed at [facility], [address], [place], [place] [address]  Reticulocytes     Status: Abnormal   Collection Time: 10/16/22  1:48 AM  Result Value Ref Range   Retic Ct Pct 1.9 0.4 - 3.1 %   RBC. 3.47 (L) 4.22 - 5.81 MIL/uL   Retic Count, Absolute 64.2 19.0 - 186.0 K/uL   Immature Retic Fract 26.3 (H) 2.3 - 15.9 %    Comment: Performed at [facility], [address], [place], [place] [address]  I-Stat venous blood gas, ED     Status: Abnormal   Collection Time: 10/16/22  2:08 AM  Result Value Ref Range   pH, Ven 7.339 7.25 - 7.43   pCO2, Ven 55.7 44 - 60 mmHg   pO2, Ven 47 (H) 32 - 45 mmHg   Bicarbonate 30.0 (H)  20.0 - 28.0 mmol/L   TCO2 32 22 - 32 mmol/L   O2 Saturation 79 %   Acid-Base Excess 3.0 (H) 0.0 - 2.0 mmol/L   Sodium 138 135 - 145 mmol/L   Potassium 5.1 3.5 - 5.1 mmol/L   Calcium, Ion 1.12 (L) 1.15 - 1.40 mmol/L   HCT 29.0 (L) 39.0 - 52.0 %   Hemoglobin 9.9 (L) 13.0 - 17.0 g/dL   Sample type VENOUS   Vitamin B12     Status: Abnormal   Collection Time: 10/16/22  2:12 AM  Result Value Ref Range   Vitamin B-12 1,121 (H) 180 - 914 pg/mL    Comment: (NOTE) This assay is not validated for testing neonatal or myeloproliferative syndrome specimens for Vitamin B12 levels. Performed at [facility], [address] [place], [place] [address]   Type and screen [facility]     Status: None   Collection Time: 10/16/22  2:12 AM  Result Value Ref Range   ABO/RH(D) A POS    Antibody Screen NEG    Sample Expiration  10/19/2022,2359 Performed at [facility], [address] [place], [place] [address]   Protime-INR     Status: None   Collection Time: 10/16/22  2:12 AM  Result Value Ref Range   Prothrombin Time 13.8 11.4 - 15.2 seconds   INR 1.1 0.8 - 1.2    Comment: (NOTE) INR goal varies based on device and disease states. Performed at [facility], [address], [place], [place] [address]   Magnesium     Status: None   Collection Time: 10/16/22  1:24 PM  Result Value Ref Range   Magnesium 1.8 1.7 - 2.4 mg/dL    Comment: Performed at [facility], [address], [place], [place] [address]  Brain natriuretic peptide     Status: Abnormal   Collection Time: 10/16/22  1:24 PM  Result Value Ref Range   B Natriuretic Peptide 558.8 (H) 0.0 - 100.0 pg/mL    Comment: Performed at [facility], [address], [place], [place] [address]  Basic metabolic panel     Status: Abnormal   Collection Time: 10/17/22  4:20 AM  Result Value Ref Range   Sodium 136 135 - 145 mmol/L   Potassium 4.5 3.5 - 5.1 mmol/L   Chloride 101 98 - 111 mmol/L    CO2 26 22 - 32 mmol/L   Glucose, Bld 145 (H) 70 - 99 mg/dL    Comment: Glucose reference range applies only to samples taken after fasting for at least 8 hours.   BUN 17 8 - 23 mg/dL   Creatinine, Ser 2.95 0.61 - 1.24 mg/dL   Calcium 8.6 (L) 8.9 - 10.3 mg/dL   GFR, Estimated >62 >13 mL/min    Comment: (NOTE) Calculated using the CKD-EPI Creatinine Equation (2021)    Anion gap 9 5 - 15    Comment: Performed at [facility], [address], [place], [place] [address]  CBC     Status: Abnormal   Collection Time: 10/17/22  4:20 AM  Result Value Ref Range   WBC 7.9 4.0 - 10.5 K/uL   RBC 3.55 (L) 4.22 - 5.81 MIL/uL   Hemoglobin 8.3 (L) 13.0 - 17.0 g/dL   HCT 84.6 (L) 96.2 - 95.2 %   MCV 84.8 80.0 - 100.0 fL   MCH 23.4 (L) 26.0 - 34.0 pg   MCHC 27.6 (L) 30.0 - 36.0 g/dL   RDW 84.1 (H) 32.4 - 40.1 %   Platelets 171 150 - 400 K/uL   nRBC 0.6 (H) 0.0 - 0.2 %    Comment: Performed at [facility], [address], [place], [place] [address]    Studies/Results: DG Chest 1 View  Result Date: 10/15/2022 CLINICAL DATA:  Shortness of breath.  Chest pain. EXAM: CHEST  1 VIEW COMPARISON:  03/18/2018 and cardiac CT 07/14/2022 FINDINGS: Atherosclerotic calcification of the aortic arch. Mild enlargement of the cardiopericardial silhouette. Indistinct pulmonary vasculature with upper zone pulmonary vascular prominence favoring pulmonary venous hypertension. No overt edema. Suspected mild central airway thickening. No airspace opacity. No blunting of the costophrenic angles. No substantial bony abnormality noted. IMPRESSION: 1. Mild enlargement of the cardiopericardial silhouette with pulmonary venous hypertension but no overt edema. 2. Airway thickening is present, suggesting bronchitis or reactive airways disease. 3.  Aortic Atherosclerosis (ICD10-I70.0). Electronically Signed   By: Gaylyn Rong M.D.   On: 10/15/2022 17:18    Medications: I have reviewed the patient's current  medications.  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 Will arrange for outpatient office visit with Dr. Levora Angel. Patient being discharged today.  Kerin Salen, MD 10/17/2022, 9:45 AM

## 2022-10-17 NOTE — Care Management (Signed)
Requested nurse to notify me when ambulatory sats are completed in case patient needs home oxygen.

## 2022-10-17 NOTE — Discharge Summary (Signed)
Physician Discharge Summary   Patient: Brian [name] MRN: [id number] DOB: 1951/04/11  Admit date:     10/15/2022  Discharge date: 10/17/22  Discharge Physician: Thad Ranger, MD    PCP: Joycelyn Rua, MD   Recommendations at discharge:   Placed on Xopenex inhaler 1 to 2 puffs every 6 hours as needed for shortness of breath, wheezing Continue Symbicort 2 puffs twice daily Counseled strongly on smoking cessation Xarelto was resumed, outpatient GI workup with endoscopy  Discharge Diagnoses:    Acute hypoxic respiratory failure   COPD with acute exacerbation   Tobacco abuse   Essential hypertension   Chronic diastolic CHF (congestive heart failure)   Acute on chronic anemia   Paroxysmal atrial fibrillation   Rheumatoid arthritis    Nicotine dependence    Hospital Course:  Patient is a 72 year old male with paroxysmal A-fib, on Xarelto, bladder CA, undergoing maintenance chemo, chronic tobacco use, chronic diastolic CHF, HTN, rheumatoid arthritis, anemia of chronic disease, baseline hemoglobin 10-13, COPD presented with 1 week of progressive shortness of breath, wheezing, productive cough, no hemoptysis.  No orthopnea, PND or peripheral edema.  No fevers or chest pain.  Longstanding history of tobacco use, smokes half pack per day from the early 50 years In ED, O2 sats 84% on room air and was placed on O2 2 L, sats improved to 95%.  RR 14-22, systolic BP in 140s, temp 99.4 F, HR 70s to 90s.   Chest x-ray showed cardiomegaly but no overt edema, bronchitis COVID-19, RSV, flu negative Hemoglobin 8.5, baseline 10-13, hemoglobin was 8.4 on 10/09/2022 and patient had planned to see GI outpatient outpatient -FOBT positive  Assessment and Plan:  Acute hypoxic respiratory failure, acute COPD exacerbation -Not on O2 at baseline, patient was placed on 2 L O2 via [place] -Patient was placed on Xopenex nebs, Pulmicort, Brovana, IV Solu-Medrol -Transition to Xopenex inhaler, Symbicort twice  daily, prednisone with taper -He wants to go home today.  Home O2 evaluation done, does not qualify for home O2    Tobacco abuse -Counseled on smoking cessation, placed on nicotine patch     Acute on chronic anemia, iron deficiency -Anemia panel showed normal folate, B12, ferritin 5, TIBC 522, iron less than 5 -FOBT positive -Xarelto was held, GI was consulted, H&H remained stable without any transfusion.   -GI will plan outpatient endoscopic procedures including EGD and colonoscopy.      Essential hypertension -BP stable, continue Cardizem CD, Lasix     Chronic diastolic CHF (congestive heart failure) -2D echo 04/2022 had shown EF 50 to 55%, indeterminate diastolic parameters, no significant valvular pathology -Currently stable, continue Lasix 40 mg daily     Paroxysmal atrial fibrillation -Rate controlled, continue Cardizem Resumed Xarelto per GI recommendation     Rheumatoid arthritis -Continue Plaquenil     Obesity Estimated body mass index is 38.42 kg/m as calculated from the following:   Height as of this encounter:  (1.753 m).   Weight as of this encounter: 118 kg.        Pain control - [employer] Controlled Substance Reporting System database was reviewed. and patient was instructed, not to drive, operate heavy machinery, perform activities at heights, swimming or participation in water activities or provide baby-sitting services while on Pain, Sleep and Anxiety Medications; until their outpatient Physician has advised to do so again. Also recommended to not to take more than prescribed Pain, Sleep and Anxiety Medications.  Consultants: Gastroenterology Procedures performed: None Disposition: Home Diet  recommendation:  Discharge Diet Orders (From admission, onward)     Start     Ordered   10/17/22 0000  Diet - low sodium heart healthy        10/17/22 0959           Cardiac diet DISCHARGE MEDICATION: Allergies as of 10/17/2022       Reactions    Aquacel-ag Surgical Hydrofiber [wound Dressings] Other (See Comments)   Tears skin   Tape Rash, Other (See Comments)   Paper tape is preferred, please!! Adhesive tape tears skin        Medication List     TAKE these medications    acetaminophen 500 MG tablet Commonly known as: TYLENOL Take 500 mg by mouth as needed (For Headache).   amiodarone 200 MG tablet Commonly known as: Pacerone Take 1 tablet (200 mg total) by mouth daily.   budesonide-formoterol 160-4.5 MCG/ACT inhaler Commonly known as: Symbicort Inhale 2 puffs into the lungs 2 (two) times daily.   diltiazem 120 MG 24 hr capsule Commonly known as: CARDIZEM CD Take 1 capsule (120 mg total) by mouth daily.   folic acid 1 MG tablet Commonly known as: FOLVITE Take 1 mg by mouth daily.   furosemide 40 MG tablet Commonly known as: LASIX Take 1 tablet (40 mg total) by mouth daily.   hydroxychloroquine 200 MG tablet Commonly known as: PLAQUENIL Take 200 mg by mouth daily.   Klor-Con M20 20 MEQ tablet Generic drug: potassium chloride SA TAKE 1 TABLET BY MOUTH EVERY DAY   levalbuterol 45 MCG/ACT inhaler Commonly known as: XOPENEX HFA Inhale 1-2 puffs into the lungs every 4 (four) hours as needed for wheezing or shortness of breath.   methotrexate 2.5 MG tablet Commonly known as: RHEUMATREX Take 25 mg by mouth every Friday. 10 tablets 1 time a week on Fridays in the evening   pantoprazole 40 MG tablet Commonly known as: Protonix Take 1 tablet (40 mg total) by mouth daily.   predniSONE 10 MG tablet Commonly known as: DELTASONE Prednisone dosing: Take  Prednisone  (4 tabs) x 3 days, then taper to  (3 tabs) x 3 days, then  (2 tabs) x 3days, then  (1 tab) x 3days, then OFF.   Simponi Aria 50 MG/4ML Soln injection Generic drug: golimumab Inject 50 mg into the vein every 8 (eight) weeks.   tamsulosin 0.4 MG Caps capsule Commonly known as: FLOMAX Take 0.4 mg by mouth at bedtime.   Xarelto  20 MG Tabs tablet Generic drug: rivaroxaban TAKE 1 TABLET BY MOUTH DAILY WITH SUPPER.        Follow-up Information     Joycelyn Rua, MD. Schedule an appointment as soon as possible for a visit in 2 week(s).   Specialty: Family Medicine Why: for hospital follow-up Contact information: [address] [place] [address] [phone]                Discharge Exam: Ceasar Mons Weights   10/16/22 1124 10/17/22 0443 10/17/22 0522  Weight: 116.3 kg 115.4 kg 115 kg   S: No acute complaints, roommate at the bedside, wants to be discharged home. States no bleeding or chest pain, breathing is improving and he can manage at home.  BP 122/71 (BP Location: Left Arm)   Pulse 73   Temp (!) 97.2 F (36.2 C)   Resp 18   Ht  (1.753 m)   Wt 115 kg   SpO2 97%   BMI 37.44  kg/m   Physical Exam General: Alert and oriented x 3, NAD Cardiovascular: S1 S2 clear, RRR.  Respiratory: CTAB, no wheezing, rales or rhonchi Gastrointestinal: Obese, soft, nontender, nondistended, NBS Ext: no pedal edema bilaterally Neuro: no new deficits Skin: No rashes Psych: Normal affect     Condition at discharge: fair  The results of significant diagnostics from this hospitalization (including imaging, microbiology, ancillary and laboratory) are listed below for reference.   Imaging Studies: DG Chest 1 View  Result Date: 10/15/2022 CLINICAL DATA:  Shortness of breath.  Chest pain. EXAM: CHEST  1 VIEW COMPARISON:  03/18/2018 and cardiac CT 07/14/2022 FINDINGS: Atherosclerotic calcification of the aortic arch. Mild enlargement of the cardiopericardial silhouette. Indistinct pulmonary vasculature with upper zone pulmonary vascular prominence favoring pulmonary venous hypertension. No overt edema. Suspected mild central airway thickening. No airspace opacity. No blunting of the costophrenic angles. No substantial bony abnormality noted. IMPRESSION: 1. Mild enlargement of the cardiopericardial  silhouette with pulmonary venous hypertension but no overt edema. 2. Airway thickening is present, suggesting bronchitis or reactive airways disease. 3.  Aortic Atherosclerosis (ICD10-I70.0). Electronically Signed   By: Gaylyn Rong M.D.   On: 10/15/2022 17:18    Microbiology: Results for orders placed or performed during the hospital encounter of 10/15/22  Resp panel by RT-PCR (RSV, Flu A&B, Covid) Anterior Nasal Swab     Status: None   Collection Time: 10/15/22  4:21 PM   Specimen: Anterior Nasal Swab  Result Value Ref Range Status   SARS Coronavirus 2 by RT PCR NEGATIVE NEGATIVE Final   Influenza A by PCR NEGATIVE NEGATIVE Final   Influenza B by PCR NEGATIVE NEGATIVE Final    Comment: (NOTE) The Xpert Xpress SARS-CoV-2/FLU/RSV plus assay is intended as an aid in the diagnosis of influenza from Nasopharyngeal swab specimens and should not be used as a sole basis for treatment. Nasal washings and aspirates are unacceptable for Xpert Xpress SARS-CoV-2/FLU/RSV testing.  Fact Sheet for Patients: BloggerCourse.com  Fact Sheet for Healthcare Providers: SeriousBroker.it  This test is not yet approved or cleared by the [place] FDA and has been authorized for detection and/or diagnosis of SARS-CoV-2 by FDA under an Emergency Use Authorization (EUA). This EUA will remain in effect (meaning this test can be used) for the duration of the COVID-19 declaration under Section 564(b)(1) of the Act, 21 U.S.C. section 360bbb-3(b)(1), unless the authorization is terminated or revoked.     Resp Syncytial Virus by PCR NEGATIVE NEGATIVE Final    Comment: (NOTE) Fact Sheet for Patients: BloggerCourse.com  Fact Sheet for Healthcare Providers: SeriousBroker.it  This test is not yet approved or cleared by the [place] FDA and has been authorized for detection and/or diagnosis of  SARS-CoV-2 by FDA under an Emergency Use Authorization (EUA). This EUA will remain in effect (meaning this test can be used) for the duration of the COVID-19 declaration under Section 564(b)(1) of the Act, 21 U.S.C. section 360bbb-3(b)(1), unless the authorization is terminated or revoked.  Performed at [facility], [address] [place], [place] [address]     Labs: CBC: Recent Labs  Lab 10/15/22 1647 10/16/22 0142 10/16/22 0208 10/17/22 0420  WBC 6.0 5.7  --  7.9  NEUTROABS 3.7 4.6  --   --   HGB 8.5* 8.3* 9.9* 8.3*  HCT 30.1* 29.7* 29.0* 30.1*  MCV 85.0 86.3  --  84.8  PLT 250 225  --  171   Basic Metabolic Panel: Recent Labs  Lab 10/15/22 1647  10/16/22 0142 10/16/22 0208 10/16/22 1324 10/17/22 0420  NA 135 138 138  --  136  K 4.5 5.0 5.1  --  4.5  CL 97* 100  --   --  101  CO2 29 29  --   --  26  GLUCOSE 131* 125*  --   --  145*  BUN 12 12  --   --  17  CREATININE 0.76 0.79  --   --  0.83  CALCIUM 8.4* 8.4*  --   --  8.6*  MG  --  1.7  --  1.8  --   PHOS  --  3.0  --   --   --    Liver Function Tests: Recent Labs  Lab 10/16/22 0142  AST 20  ALT 19  ALKPHOS 67  BILITOT <0.1*  PROT 7.0  ALBUMIN 3.3*   CBG: No results for input(s): "GLUCAP" in the last 168 hours.  Discharge time spent: greater than 30 minutes.  Signed: Thad Ranger, MD [facility] 10/17/2022

## 2022-10-17 NOTE — Progress Notes (Signed)
SATURATION QUALIFICATIONS: (This note is used to comply with regulatory documentation for home oxygen)  Patient Saturations on Room Air at Rest = 98%  Patient Saturations on Room Air while Ambulating = 97%  Pt walked with NT, NT reported no need for O2, lowest SPO2 is 97 on RA on ambulation. Pt has been stable with no complains of SOB. MD was informed and was DC by Charge Nurse.

## 2022-10-19 LAB — RESPIRATORY PANEL BY PCR

## 2022-10-20 ENCOUNTER — Ambulatory Visit: Payer: Medicare Other | Admitting: Cardiovascular Disease

## 2022-10-27 ENCOUNTER — Other Ambulatory Visit: Payer: Self-pay | Admitting: Physician Assistant

## 2022-10-27 ENCOUNTER — Encounter: Payer: Self-pay | Admitting: Pulmonary Disease

## 2022-10-27 ENCOUNTER — Ambulatory Visit (INDEPENDENT_AMBULATORY_CARE_PROVIDER_SITE_OTHER): Payer: Medicare Other | Admitting: Pulmonary Disease

## 2022-10-27 VITALS — BP 110/56 | HR 91 | Ht 69.0 in | Wt 255.6 lb

## 2022-10-27 DIAGNOSIS — I4819 Other persistent atrial fibrillation: Secondary | ICD-10-CM

## 2022-10-27 DIAGNOSIS — J441 Chronic obstructive pulmonary disease with (acute) exacerbation: Secondary | ICD-10-CM | POA: Diagnosis not present

## 2022-10-27 DIAGNOSIS — F17210 Nicotine dependence, cigarettes, uncomplicated: Secondary | ICD-10-CM | POA: Diagnosis not present

## 2022-10-27 DIAGNOSIS — I5032 Chronic diastolic (congestive) heart failure: Secondary | ICD-10-CM

## 2022-10-27 DIAGNOSIS — F17200 Nicotine dependence, unspecified, uncomplicated: Secondary | ICD-10-CM

## 2022-10-27 NOTE — Progress Notes (Signed)
Brian [name]    [id number]    [birth year]  Primary Care Physician:Meyers, Jeannett Senior, MD  Referring Physician: Joycelyn Rua, MD [address] [place],  [place] [address]  Chief complaint:   Patient being seen for obstructive lung disease  HPI:  History of COPD Recent hospitalization for COPD exacerbation  Active smoker about half a pack a day  45-pack-year smoking history  On Symbicort and Xopenex Does have a history of atrial fibrillation/flutter History of heart failure  History of arthritis, rheumatoid arthritis  No pertinent occupational history  History of TIA, chronic diastolic heart failure  Some degree of deconditioning  History of bladder cancer  Does use Symbicort twice a day Short of breath with mild to moderate exertion  Outpatient Encounter Medications as of 10/27/2022  Medication Sig   acetaminophen (TYLENOL) 500 MG tablet Take 500 mg by mouth as needed (For Headache).   amiodarone (PACERONE) 200 MG tablet Take 1 tablet (200 mg total) by mouth daily.   budesonide-formoterol (SYMBICORT) 160-4.5 MCG/ACT inhaler Inhale 2 puffs into the lungs 2 (two) times daily.   diltiazem (CARDIZEM CD) 120 MG 24 hr capsule Take 1 capsule (120 mg total) by mouth daily.   folic acid (FOLVITE) 1 MG tablet Take 1 mg by mouth daily.   furosemide (LASIX) 40 MG tablet Take 1 tablet (40 mg total) by mouth daily.   golimumab (SIMPONI ARIA) 50 MG/4ML SOLN injection Inject 50 mg into the vein every 8 (eight) weeks.   hydroxychloroquine (PLAQUENIL) 200 MG tablet Take 200 mg by mouth daily.   KLOR-CON M20 20 MEQ tablet TAKE 1 TABLET BY MOUTH EVERY DAY   levalbuterol (XOPENEX HFA) 45 MCG/ACT inhaler Inhale 1-2 puffs into the lungs every 4 (four) hours as needed for wheezing or shortness of breath.   methotrexate (RHEUMATREX) 2.5 MG tablet Take 25 mg by mouth every Friday. 10 tablets 1 time a week on Fridays in the evening   pantoprazole (PROTONIX) 40 MG tablet  Take 1 tablet (40 mg total) by mouth daily.   predniSONE (DELTASONE) 10 MG tablet Prednisone dosing: Take  Prednisone 40mg  (4 tabs) x 3 days, then taper to 30mg  (3 tabs) x 3 days, then 20mg  (2 tabs) x 3days, then 10mg  (1 tab) x 3days, then OFF.   rivaroxaban (XARELTO) 20 MG TABS tablet TAKE 1 TABLET BY MOUTH DAILY WITH SUPPER.   tamsulosin (FLOMAX) 0.4 MG CAPS capsule Take 0.4 mg by mouth at bedtime.   No facility-administered encounter medications on file as of 10/27/2022.    Allergies as of 10/27/2022 - Review Complete 10/27/2022  Allergen Reaction Noted   Aquacel-ag surgical hydrofiber [wound dressings] Other (See Comments) 04/29/2022   Tape Rash and Other (See Comments) 09/30/2016    Past Medical History:  Diagnosis Date   Anemia    none since 2019   Anxiety    Atrial flutter (HCC)    a. 08/2012   CHF (congestive heart failure) (HCC)    chronic diastolic chf, pt denies   GERD (gastroesophageal reflux disease)    Gout    pt denies   History of kidney stones    passed   Hypertension    Obesity    Paroxysmal atrial fibrillation (HCC)    Pneumonia 09/2016   Rheumatoid arthritis (HCC)    "a little bit qwhere" (07/22/2017)   Shortness of breath    with heavy  exertion   Tobacco abuse  Past Surgical History:  Procedure Laterality Date   A-FLUTTER ABLATION N/A 12/29/2016   Procedure: A-Flutter Ablation;  Surgeon: Hillis Range, MD;  Location: [facility]ASIVE [facility] LAB;  Service: Cardiovascular;  Laterality: N/A;   ATRIAL FIBRILLATION ABLATION N/A 07/20/2022   Procedure: ATRIAL FIBRILLATION ABLATION;  Surgeon: Maurice Small, MD;  Location: [facility];  Service: Cardiovascular;  Laterality: N/A;   CARDIOVERSION N/A 11/05/2016   Procedure: CARDIOVERSION;  Surgeon: Quintella Reichert, MD;  Location: [facility] ENDOSCOPY;  Service: Cardiovascular;  Laterality: N/A;   CARDIOVERSION N/A 06/09/2022   Procedure: CARDIOVERSION;  Surgeon: Jake Bathe, MD;  Location: [facility] ENDOSCOPY;  Service:  Cardiovascular;  Laterality: N/A;   CARDIOVERSION N/A 09/14/2022   Procedure: CARDIOVERSION;  Surgeon: Christell Constant, MD;  Location: [facility] ENDOSCOPY;  Service: Cardiovascular;  Laterality: N/A;   IR THORACENTESIS ASP PLEURAL SPACE W/IMG GUIDE  10/01/2016   JOINT REPLACEMENT     Right hip and left knee   MICROLARYNGOSCOPY  09/02/2007   with excision of right vocal cord mass, Dr. Pollyann Kennedy   PROSTATE BIOPSY N/A 02/22/2020   Procedure: BIOPSY TRANSRECTAL ULTRASONIC PROSTATE (TUBP);  Surgeon: Bjorn Pippin, MD;  Location[facility];  Service: Urology;  Laterality: N/A;   TOTAL HIP ARTHROPLASTY Right 11/17/2017   Procedure: RIGHT TOTAL HIP ARTHROPLASTY ANTERIOR APPROACH;  Surgeon: Tarry Kos, MD;  Location: [facility] OR;  Service: Orthopedics;  Laterality: Right;   TOTAL HIP ARTHROPLASTY Left 03/07/2018   Procedure: LEFT TOTAL HIP ARTHROPLASTY ANTERIOR APPROACH;  Surgeon: Tarry Kos, MD;  Location: [facility] OR;  Service: Orthopedics;  Laterality: Left;   TOTAL KNEE ARTHROPLASTY Left 07/22/2017   Procedure: LEFT TOTAL KNEE ARTHROPLASTY;  Surgeon: Tarry Kos, MD;  Location: [facility] OR;  Service: Orthopedics;  Laterality: Left;   TRANSURETHRAL RESECTION OF BLADDER TUMOR N/A 04/02/2020   Procedure: RESTAGING TRANSURETHRAL RESECTION OF BLADDER TUMOR (TURBT);  Surgeon: Bjorn Pippin, MD;  Location[facility];  Service: Urology;  Laterality: N/A;  GENERAL ANESTHESIA WIHT PARALYSIS   TRANSURETHRAL RESECTION OF BLADDER TUMOR WITH MITOMYCIN-C N/A 02/22/2020   Procedure: TRANSURETHRAL RESECTION OF BLADDER TUMOR WITH   GEMCITABINE;  Surgeon: Bjorn Pippin, MD;  Location: [facility];  Service: Urology;  Laterality: N/A;    Family History  Problem Relation Age of Onset   Cancer Mother    Heart disease Father    Heart attack Father    Cancer Father    Diabetes Sister     Social History   Socioeconomic History   Marital status: Divorced    Spouse name: Not on file   Number of  children: 3   Years of education: Not on file   Highest education level: Not on file  Occupational History   Not on file  Tobacco Use   Smoking status: Every Day    Packs/day: 0.50    Years: 45.00    Additional pack years: 0.00    Total pack years: 22.50    Types: Cigarettes   Smokeless tobacco: Never   Tobacco comments:    1/2ppd 09/25/22  Vaping Use   Vaping Use: Never used  Substance and Sexual Activity   Alcohol use: Yes    Comment: 07/22/2017 "nothing since 2016"   Drug use: No    Comment: 07/22/2017 "none since 11/2016" cocaine (sister is not aware)   Sexual activity: Not Currently  Other Topics Concern   Not on file  Social History Narrative   Lives in [place]  Works at [employer] next to his sister.   Divorced with 3 children and 9 grandchildren, he states ex-wife is still a friend   Social Determinants of Corporate investment banker Strain: Not on file  Food Insecurity: No Food Insecurity (10/16/2022)   Hunger Vital Sign    Worried About Running Out of Food in the Last Year: Never true    Ran Out of Food in the Last Year: Never true  Transportation Needs: No Transportation Needs (10/16/2022)   PRAPARE - Administrator, Civil Service (Medical): No    Lack of Transportation (Non-Medical): No  Physical Activity: Not on file  Stress: Not on file  Social Connections: Not on file  Intimate Partner Violence: Not At Risk (10/16/2022)   Humiliation, Afraid, Rape, and Kick questionnaire    Fear of Current or Ex-Partner: No    Emotionally Abused: No    Physically Abused: No    Sexually Abused: No    Review of Systems  Respiratory:  Positive for shortness of breath.     Vitals:   10/27/22 0953  BP: (!) 110/56  Pulse: 91  SpO2: 96%     Physical Exam Constitutional:      Appearance: He is obese.  HENT:     Head: Normocephalic.     Mouth/Throat:     Mouth: Mucous membranes are moist.  Eyes:     General: No scleral  icterus. Cardiovascular:     Rate and Rhythm: Normal rate and regular rhythm.     Heart sounds: No murmur heard.    No friction rub.  Pulmonary:     Effort: No respiratory distress.     Breath sounds: No stridor. No wheezing or rhonchi.     Comments: Poor air movement bilaterally Musculoskeletal:     Cervical back: No rigidity or tenderness.  Neurological:     Mental Status: He is alert.  Psychiatric:        Mood and Affect: Mood normal.      Data Reviewed: Recent ABG-VBG reviewed showing 7.34/55/47  Assessment:  Chronic obstructive pulmonary disease -Stages unknown -Will need to get about pulmonary function test -Recent hospitalization blood gas did reveal some hypercarbia although this was from a venous blood gas  History of heart failure Diastolic heart failure -Optimize diuretics  Active smoker -Counseled about the need to quit smoking as this affects both his heart and his lungs  Atrial fibrillation -Continue current medications -On amiodarone  Rheumatoid arthritis -On methotrexate  Class II obesity  Plan/Recommendations: Smoking cessation counseling  Continue bronchodilator treatments -Currently on Symbicort  Continue diuretics  Graded activity as tolerated  He has to quit smoking  Obtain low-dose CT scan of the chest  Obtain pulmonary function test  Follow-up in about 3 months  Encouraged to call us with any significant concerns   Virl Diamond MD [facility] 10/27/2022, 10:03 AM  CC: Joycelyn Rua, MD

## 2022-10-27 NOTE — Patient Instructions (Signed)
I will see you in about 3 months  Schedule for low-dose CT scan screening  Schedule for pulmonary function test  Continue working on quitting smoking  Call us with significant concerns  Graded exercise as tolerated

## 2022-10-27 NOTE — Addendum Note (Signed)
Addended by: Lanna Poche on: 10/27/2022 10:58 AM   Modules accepted: Orders

## 2022-11-11 ENCOUNTER — Ambulatory Visit: Payer: Medicare Other | Attending: Cardiovascular Disease | Admitting: Cardiovascular Disease

## 2022-11-11 ENCOUNTER — Encounter: Payer: Self-pay | Admitting: Cardiovascular Disease

## 2022-11-11 VITALS — BP 140/76 | HR 90 | Ht 69.0 in | Wt 259.0 lb

## 2022-11-11 DIAGNOSIS — I4892 Unspecified atrial flutter: Secondary | ICD-10-CM | POA: Diagnosis not present

## 2022-11-11 DIAGNOSIS — I4819 Other persistent atrial fibrillation: Secondary | ICD-10-CM | POA: Diagnosis not present

## 2022-11-11 NOTE — Patient Instructions (Signed)
Medication Instructions:  Your physician recommends that you continue on your current medications as directed. Please refer to the Current Medication list given to you today. *If you need a refill on your cardiac medications before your next appointment, please call your pharmacy*   Testing/Procedures: AF ablation Your physician has recommended that you have an ablation. Catheter ablation is a medical procedure used to treat some cardiac arrhythmias (irregular heartbeats). During catheter ablation, a long, thin, flexible tube is put into a blood vessel in your groin (upper thigh), or neck. This tube is called an ablation catheter. It is then guided to your heart through the blood vessel. Radio frequency waves destroy small areas of heart tissue where abnormal heartbeats may cause an arrhythmia to start. Please see the instruction sheet given to you today.   Follow-Up: At [facility], you and your health needs are our priority.  As part of our continuing mission to provide you with exceptional heart care, we have created designated Provider Care Teams.  These Care Teams include your primary Cardiologist (physician) and Advanced Practice Providers (APPs -  Physician Assistants and Nurse Practitioners) who all work together to provide you with the care you need, when you need it.  We recommend signing up for the patient portal called "MyChart".  Sign up information is provided on this After Visit Summary.  MyChart is used to connect with patients for Virtual Visits (Telemedicine).  Patients are able to view lab/test results, encounter notes, upcoming appointments, etc.  Non-urgent messages can be sent to your provider as well.   To learn more about what you can do with MyChart, go to [email or web address].    Your next appointment:   We will contact you within a week about moving your ablation up   Provider:   York Pellant, MD

## 2022-11-11 NOTE — Progress Notes (Signed)
Electrophysiology Office Note:    Date:  11/11/2022   ID:  BERLEY TIBBETTS, DOB [birth year], MRN [id number]  PCP:  Joycelyn Rua, MD   [facility] Providers Cardiologist:  Donato Schultz, MD Electrophysiologist:  Maurice Small, MD     Referring MD: Joycelyn Rua, MD     History of Present Illness:    Brian [name] is a 72 y.o. male with a hx of PAF, atrial flutter, nephrolithiasis, HTN, cocaine use, tobacco use, HF with recovered EF referred for rhythm management.  He developed CHFpEF in the setting of atrial flutter that resolved with flutter ablation in 2018. He developed atrial fibrillation related to sepsis. He appears to have remained in sinus rhythm until follow-up in July, 2023. Cardioversion was deferred due to interruption of anticoagulation for serial urologic procedures regarding his bladder cancer.  He has been reluctant to take anticoagulation in the past.                                                                                                                                                   He underwent A-fib ablation in January 2024.  There was significant esophageal heating in the right inferior vein.  This limited ablation in this region.  The patient had multiple recurrences of atrial fibrillation during the procedure and received 2 cardioversions.  Since the ablation, he has had recurrence of arrhythmia with an atrial flutter and atrial fibrillation.  He was placed on amiodarone and continues amiodarone at present.  He has also had issues with anemia and feels fatigued.  At our last visit, he felt very fatigued coming to clinic from the parking lot despite being in normal sinus.  He was admitted for a COPD exacerbation; noted to have CHFpEF at that time and received some diuresis. He remains fatigued and short of breath.    Past Medical History:  Diagnosis Date   Anemia    none since 2019   Anxiety    Atrial flutter (HCC)    a. 08/2012    CHF (congestive heart failure) (HCC)    chronic diastolic chf, pt denies   GERD (gastroesophageal reflux disease)    Gout    pt denies   History of kidney stones    passed   Hypertension    Obesity    Paroxysmal atrial fibrillation (HCC)    Pneumonia 09/2016   Rheumatoid arthritis (HCC)    "a little bit qwhere" (07/22/2017)   Shortness of breath    with heavy  exertion   Tobacco abuse     Past Surgical History:  Procedure Laterality Date   A-FLUTTER ABLATION N/A 12/29/2016   Procedure: A-Flutter Ablation;  Surgeon: Hillis Range, MD;  [facility]: [facility]IVE [facility] LAB;  Service: Cardiovascular;  Laterality: N/A;   ATRIAL FIBRILLATION ABLATION N/A 07/20/2022   Procedure: ATRIAL  FIBRILLATION ABLATION;  Surgeon: Maurice Small, MD;  Location: [facility]VAS[facility] CV [facility];  Service: Cardiovascular;  Laterality: N/A;   CARDIOVERSION N/A 11/05/2016   Procedure: CARDIOVERSION;  Surgeon: Quintella Reichert, MD;  Location: [facility] ENDOSCOPY;  Service: Cardiovascular;  Laterality: N/A;   CARDIOVERSION N/A 06/09/2022   Procedure: CARDIOVERSION;  Surgeon: Jake Bathe, MD;  Location: [facility] ENDOSCOPY;  Service: Cardiovascular;  Laterality: N/A;   CARDIOVERSION N/A 09/14/2022   Procedure: CARDIOVERSION;  Surgeon: Christell Constant, MD;  Location: [facility] ENDOSCOPY;  Service: Cardiovascular;  Laterality: N/A;   IR THORACENTESIS ASP PLEURAL SPACE W/IMG GUIDE  10/01/2016   JOINT REPLACEMENT     Right hip and left knee   MICROLARYNGOSCOPY  09/02/2007   with excision of right vocal cord mass, Dr. Pollyann Kennedy   PROSTATE BIOPSY N/A 02/22/2020   Procedure: BIOPSY TRANSRECTAL ULTRASONIC PROSTATE (TUBP);  Surgeon: Bjorn Pippin, MD;  Location[facility];  Service: Urology;  Laterality: N/A;   TOTAL HIP ARTHROPLASTY Right 11/17/2017   Procedure: RIGHT TOTAL HIP ARTHROPLASTY ANTERIOR APPROACH;  Surgeon: Tarry Kos, MD;  Location: [facility] OR;  Service: Orthopedics;  Laterality: Right;   TOTAL HIP ARTHROPLASTY Left 03/07/2018    Procedure: LEFT TOTAL HIP ARTHROPLASTY ANTERIOR APPROACH;  Surgeon: Tarry Kos, MD;  Location: [facility] OR;  Service: Orthopedics;  Laterality: Left;   TOTAL KNEE ARTHROPLASTY Left 07/22/2017   Procedure: LEFT TOTAL KNEE ARTHROPLASTY;  Surgeon: Tarry Kos, MD;  Location: [facility] OR;  Service: Orthopedics;  Laterality: Left;   TRANSURETHRAL RESECTION OF BLADDER TUMOR N/A 04/02/2020   Procedure: RESTAGING TRANSURETHRAL RESECTION OF BLADDER TUMOR (TURBT);  Surgeon: Bjorn Pippin, MD;  Location: [facility];  Service: Urology;  Laterality: N/A;  GENERAL ANESTHESIA WIHT PARALYSIS   TRANSURETHRAL RESECTION OF BLADDER TUMOR WITH MITOMYCIN-C N/A 02/22/2020   Procedure: TRANSURETHRAL RESECTION OF BLADDER TUMOR WITH   GEMCITABINE;  Surgeon: Bjorn Pippin, MD;  Location: [facility];  Service: Urology;  Laterality: N/A;    Current Medications: Current Meds  Medication Sig   acetaminophen (TYLENOL) 500 MG tablet Take 500 mg by mouth as needed (For Headache).   amiodarone (PACERONE) 200 MG tablet Take 1 tablet (200 mg total) by mouth daily. Please keep upcoming appt.with Cardiologist for future refills. Thank You.   budesonide-formoterol (SYMBICORT) 160-4.5 MCG/ACT inhaler Inhale 2 puffs into the lungs 2 (two) times daily.   diltiazem (CARDIZEM CD) 120 MG 24 hr capsule Take 1 capsule (120 mg total) by mouth daily.   folic acid (FOLVITE) 1 MG tablet Take 1 mg by mouth daily.   furosemide (LASIX) 40 MG tablet Take 1 tablet (40 mg total) by mouth daily.   golimumab (SIMPONI ARIA) 50 MG/4ML SOLN injection Inject 50 mg into the vein every 8 (eight) weeks.   hydroxychloroquine (PLAQUENIL) 200 MG tablet Take 200 mg by mouth daily.   KLOR-CON M20 20 MEQ tablet TAKE 1 TABLET BY MOUTH EVERY DAY   levalbuterol (XOPENEX HFA) 45 MCG/ACT inhaler Inhale 1-2 puffs into the lungs every 4 (four) hours as needed for wheezing or shortness of breath.   methotrexate (RHEUMATREX) 2.5 MG tablet Take 25 mg by  mouth every Friday. 10 tablets 1 time a week on Fridays in the evening   pantoprazole (PROTONIX) 40 MG tablet Take 1 tablet (40 mg total) by mouth daily.   predniSONE (DELTASONE) 10 MG tablet Prednisone dosing: Take  Prednisone 40mg  (4 tabs) x 3 days, then taper to 30mg  (3 tabs) x 3  days, then 20mg  (2 tabs) x 3days, then 10mg  (1 tab) x 3days, then OFF.   rivaroxaban (XARELTO) 20 MG TABS tablet TAKE 1 TABLET BY MOUTH DAILY WITH SUPPER.   tamsulosin (FLOMAX) 0.4 MG CAPS capsule Take 0.4 mg by mouth at bedtime.     Allergies:   Aquacel-ag surgical hydrofiber [wound dressings] and Tape   Social History   Socioeconomic History   Marital status: Divorced    Spouse name: Not on file   Number of children: 3   Years of education: Not on file   Highest education level: Not on file  Occupational History   Not on file  Tobacco Use   Smoking status: Every Day    Packs/day: 0.50    Years: 45.00    Additional pack years: 0.00    Total pack years: 22.50    Types: Cigarettes   Smokeless tobacco: Never   Tobacco comments:    1/2ppd 09/25/22  Vaping Use   Vaping Use: Never used  Substance and Sexual Activity   Alcohol use: Yes    Comment: 07/22/2017 "nothing since 2016"   Drug use: No    Comment: 07/22/2017 "none since 11/2016" cocaine (sister is not aware)   Sexual activity: Not Currently  Other Topics Concern   Not on file  Social History Narrative   Lives in [place]    Works at a convenience store   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   Divorced with 3 children and 9 grandchildren, he states ex-wife is still a friend   Social Determinants of Corporate investment banker Strain: Not on file  Food Insecurity: No Food Insecurity (10/16/2022)   Hunger Vital Sign    Worried About Running Out of Food in the Last Year: Never true    Ran Out of Food in the Last Year: Never true  Transportation Needs: No Transportation Needs (10/16/2022)   PRAPARE - Administrator, Civil Service  (Medical): No    Lack of Transportation (Non-Medical): No  Physical Activity: Not on file  Stress: Not on file  Social Connections: Not on file     Family History: The patient's family history includes Cancer in his father and mother; Diabetes in his sister; Heart attack in his father; Heart disease in his father.  ROS:   Please see the history of present illness.    All other systems reviewed and are negative.  EKGs/Labs/Other Studies Reviewed Today:    TTE 04/2022  1. Left ventricular ejection fraction, by estimation, is 50 to 55%. The  left ventricle has low normal function. The left ventricle has no regional  wall motion abnormalities. Left ventricular diastolic parameters are  indeterminate.   2. Right ventricular systolic function is normal. The right ventricular  size is normal. Tricuspid regurgitation signal is inadequate for assessing  PA pressure.   3. The mitral valve is normal in structure. No evidence of mitral valve  regurgitation. No evidence of mitral stenosis.   4. The aortic valve is normal in structure. Aortic valve regurgitation is  not visualized. No aortic stenosis is present.   5. The inferior vena cava is normal in size with greater than 50%  respiratory variability, suggesting right atrial pressure of 3 mmHg.   EKG:  Last EKG results: today -- sinus rhythm with 1st degree AV block   Recent Labs: 04/29/2022: NT-Pro BNP 136 10/09/2022: TSH 0.945 10/16/2022: ALT 19; B Natriuretic Peptide 558.8; Magnesium 1.8 10/17/2022: BUN 17; Creatinine, Ser 0.83; Hemoglobin  8.3; Platelets 171; Potassium 4.5; Sodium 136     Physical Exam:    VS:  BP (!) 140/76   Pulse 90   Ht 5\' 9"  (1.753 m)   Wt 259 lb (117.5 kg)   SpO2 96%   BMI 38.25 kg/m     Wt Readings from Last 3 Encounters:  11/11/22 259 lb (117.5 kg)  10/27/22 255 lb 9.6 oz (115.9 kg)  10/17/22 253 lb 8.5 oz (115 kg)     GEN:  Well nourished, well developed in no acute distress CARDIAC: Irregular  rhythm with normal rate, no murmurs, rubs, gallops RESPIRATORY:  Normal work of breathing MUSCULOSKELETAL: trace edema    ASSESSMENT & PLAN:    Persistent atrial fibrillation:  symptomatic with NYHA II-III symptoms.   He has had recurrence after ablation, also has had flutter, likely atypical -- both during the blinding period. He is in atrial fibrillation today, 11/11/2022 Continue amiodarone for now.  He is scheduled for repeat ablation in September. I will put him on the list to expedite, if possible.  Fatigue Significant fatigue  despite being in normal sinus rhythm:   Atrial flutter: s/p ablation  History of CHFpEF: I think he will do much better in sinus rhythm.        Medication Adjustments/Labs and Tests Ordered: Current medicines are reviewed at length with the patient today.  Concerns regarding medicines are outlined above.  No orders of the defined types were placed in this encounter.  No orders of the defined types were placed in this encounter.    Signed, Maurice Small, MD  11/11/2022 11:47 AM    [facility]

## 2022-11-16 DIAGNOSIS — R195 Other fecal abnormalities: Secondary | ICD-10-CM | POA: Diagnosis not present

## 2022-11-16 DIAGNOSIS — Z86010 Personal history of colonic polyps: Secondary | ICD-10-CM | POA: Diagnosis not present

## 2022-11-16 DIAGNOSIS — D509 Iron deficiency anemia, unspecified: Secondary | ICD-10-CM | POA: Diagnosis not present

## 2022-11-17 ENCOUNTER — Telehealth: Payer: Self-pay

## 2022-11-17 DIAGNOSIS — I4819 Other persistent atrial fibrillation: Secondary | ICD-10-CM

## 2022-11-17 NOTE — Telephone Encounter (Signed)
Pt is scheduled for Afib Ablation on 01/07/23 @ 1:30..  Lab appt: 6/25  Pt request to have Instruction letters mailed.

## 2022-11-18 ENCOUNTER — Telehealth: Payer: Self-pay

## 2022-11-18 DIAGNOSIS — R931 Abnormal findings on diagnostic imaging of heart and coronary circulation: Secondary | ICD-10-CM

## 2022-11-18 NOTE — Telephone Encounter (Signed)
Pharmacy please advise on holding Xarelto prior to colonoscopy scheduled for TBD. Thank you.

## 2022-11-18 NOTE — Telephone Encounter (Signed)
   Pre-operative Risk Assessment    Patient Name: Brian [name]  DOB: 1951/03/07 MRN: [id number]      Request for Surgical Clearance    Procedure:   colonoscopy/endoscopy  Date of Surgery:  Clearance TBD                                 Surgeon:  Dr. Chelsea Aus Surgeon's Group or Practice Name:  [facility] Phone number:  [phone] Fax number:  [phone]   Type of Clearance Requested:   - Medical  - Pharmacy:  Hold Rivaroxaban (Xarelto) pt will need instructions on when/if to hold   Type of Anesthesia:   propofol   Additional requests/questions:    Wynetta Fines   11/18/2022, 4:49 PM

## 2022-11-19 ENCOUNTER — Ambulatory Visit (HOSPITAL_BASED_OUTPATIENT_CLINIC_OR_DEPARTMENT_OTHER)
Admission: RE | Admit: 2022-11-19 | Discharge: 2022-11-19 | Disposition: A | Discharge status: Home / Self Care | Payer: Medicare Other | Source: Ambulatory Visit | Attending: Family Medicine | Admitting: Family Medicine

## 2022-11-19 DIAGNOSIS — F17200 Nicotine dependence, unspecified, uncomplicated: Secondary | ICD-10-CM | POA: Insufficient documentation

## 2022-11-19 DIAGNOSIS — F17210 Nicotine dependence, cigarettes, uncomplicated: Secondary | ICD-10-CM | POA: Diagnosis not present

## 2022-11-19 DIAGNOSIS — R931 Abnormal findings on diagnostic imaging of heart and coronary circulation: Secondary | ICD-10-CM | POA: Insufficient documentation

## 2022-11-19 NOTE — Telephone Encounter (Signed)
Patient with diagnosis of afib/flutter on Xarelto for anticoagulation.    Procedure: colonoscopy/endoscopy Date of procedure: TBD  CHA2DS2-VASc Score = 6  This indicates a 9.7% annual risk of stroke. The patient's score is based upon: CHF History: 1 HTN History: 1 Diabetes History: 0 Stroke History: 2 Vascular Disease History: 1 Age Score: 1 Gender Score: 0  TIA in 2017, was noncompliant with Xarelto at the time.  Pt underwent afib ablation on 07/20/22, DCCV on 09/14/22, and is now pending repeat afib ablation on 01/07/23 - cannot hold anticoag for 3 weeks prior or 3 months post procedure.  CrCl >190mL/min Platelet count 171K  Per office protocol, patient can hold Xarelto for 1-2 days prior to procedure, however procedure would need to be completed by June 14 to ensure he is able tor resume his anticoag and be therapeutically anticoagulated for his upcoming ablation. Otherwise, procedure would need to be pushed out to mid October.  **This guidance is not considered finalized until pre-operative APP has relayed final recommendations.**

## 2022-11-19 NOTE — Telephone Encounter (Signed)
   Patient Name: Brian [name]  DOB: 1951-03-28 MRN: [id number]  Primary Cardiologist: Donato Schultz, MD  Clinical pharmacists have reviewed the patient's past medical history, labs, and current medications as part of preoperative protocol coverage. The following recommendations have been made:  Pt underwent afib ablation on 07/20/22, DCCV on 09/14/22, and is now pending repeat afib ablation on 01/07/23 - cannot hold anticoag for 3 weeks prior or 3 months post procedure.   Per office protocol, patient can hold Xarelto for 1-2 days prior to procedure, however procedure would need to be completed by June 14 to ensure he is able tor resume his anticoag and be therapeutically anticoagulated for his upcoming ablation. Otherwise, procedure would need to be pushed out to mid October.   Please call with questions.  Napoleon Form, Leodis Rains, NP 11/19/2022, 9:51 AM

## 2022-11-20 DIAGNOSIS — N3020 Other chronic cystitis without hematuria: Secondary | ICD-10-CM | POA: Diagnosis not present

## 2022-11-24 ENCOUNTER — Telehealth: Payer: Self-pay | Admitting: Pulmonary Disease

## 2022-11-24 NOTE — Telephone Encounter (Signed)
Pt calling in for CT Results, Pls advise

## 2022-11-26 NOTE — Telephone Encounter (Signed)
Spoke with patient regarding prior message.  Patient is requesting CT result's.  Dr.Olalere can you please advice.  Thank you

## 2022-11-30 NOTE — Telephone Encounter (Signed)
Dr. Val Eagle patient is calling again in regards to CT results. When you have a minute can you please advise?

## 2022-12-02 DIAGNOSIS — Z111 Encounter for screening for respiratory tuberculosis: Secondary | ICD-10-CM | POA: Diagnosis not present

## 2022-12-02 DIAGNOSIS — M0579 Rheumatoid arthritis with rheumatoid factor of multiple sites without organ or systems involvement: Secondary | ICD-10-CM | POA: Diagnosis not present

## 2022-12-02 DIAGNOSIS — R5383 Other fatigue: Secondary | ICD-10-CM | POA: Diagnosis not present

## 2022-12-02 DIAGNOSIS — Z79899 Other long term (current) drug therapy: Secondary | ICD-10-CM | POA: Diagnosis not present

## 2022-12-07 NOTE — Telephone Encounter (Signed)
Called and spoke w/ pt, I encouraged him to keep his OV w/ Dr.O on 7/5. He verbalized understanding. NFN att.

## 2022-12-07 NOTE — Telephone Encounter (Signed)
CT with evidence of emphysema/COPD  Left lower lobe nodule needs follow-up-follow-up CT scan in 6 months  Evidence of coronary artery disease  Bilateral pleural effusion-likely related to heart failure  Encourage to ensure he keeps follow-up appointment

## 2022-12-18 ENCOUNTER — Encounter: Payer: Self-pay | Admitting: Cardiology

## 2022-12-18 ENCOUNTER — Ambulatory Visit: Payer: Medicare Other | Attending: Cardiology | Admitting: Cardiology

## 2022-12-18 VITALS — BP 118/62 | HR 89 | Ht 69.0 in

## 2022-12-18 DIAGNOSIS — J449 Chronic obstructive pulmonary disease, unspecified: Secondary | ICD-10-CM | POA: Diagnosis not present

## 2022-12-18 DIAGNOSIS — I4819 Other persistent atrial fibrillation: Secondary | ICD-10-CM | POA: Diagnosis not present

## 2022-12-18 DIAGNOSIS — I5032 Chronic diastolic (congestive) heart failure: Secondary | ICD-10-CM

## 2022-12-18 DIAGNOSIS — D649 Anemia, unspecified: Secondary | ICD-10-CM | POA: Diagnosis not present

## 2022-12-18 NOTE — Patient Instructions (Signed)
Medication Instructions:  Your physician recommends that you continue on your current medications as directed. Please refer to the Current Medication list given to you today.  *If you need a refill on your cardiac medications before your next appointment, please call your pharmacy*  Testing/Procedures: WATCHMAN: After your ablation you will be contacted by Nurse Navigator, Karsten Fells to schedule your procedure date. If you have any questions she can be reached at [phone].   Follow-Up: At [facility], you and your health needs are our priority.  As part of our continuing mission to provide you with exceptional heart care, we have created designated Provider Care Teams.  These Care Teams include your primary Cardiologist (physician) and Advanced Practice Providers (APPs -  Physician Assistants and Nurse Practitioners) who all work together to provide you with the care you need, when you need it.  Your next appointment:   We will call you to arrange your follow up

## 2022-12-19 ENCOUNTER — Other Ambulatory Visit: Payer: Self-pay

## 2022-12-19 ENCOUNTER — Encounter (HOSPITAL_COMMUNITY): Payer: Self-pay

## 2022-12-19 ENCOUNTER — Telehealth: Payer: Self-pay | Admitting: Cardiology

## 2022-12-19 ENCOUNTER — Observation Stay (HOSPITAL_COMMUNITY)
Admission: EM | Admit: 2022-12-19 | Discharge: 2022-12-20 | Disposition: A | Discharge status: Home / Self Care | Payer: Medicare Other | Attending: Internal Medicine | Admitting: Internal Medicine

## 2022-12-19 ENCOUNTER — Emergency Department (HOSPITAL_COMMUNITY): Payer: Medicare Other

## 2022-12-19 DIAGNOSIS — Z7901 Long term (current) use of anticoagulants: Secondary | ICD-10-CM | POA: Diagnosis not present

## 2022-12-19 DIAGNOSIS — I4819 Other persistent atrial fibrillation: Secondary | ICD-10-CM | POA: Diagnosis present

## 2022-12-19 DIAGNOSIS — D649 Anemia, unspecified: Secondary | ICD-10-CM | POA: Diagnosis not present

## 2022-12-19 DIAGNOSIS — J441 Chronic obstructive pulmonary disease with (acute) exacerbation: Secondary | ICD-10-CM | POA: Diagnosis present

## 2022-12-19 DIAGNOSIS — I10 Essential (primary) hypertension: Secondary | ICD-10-CM | POA: Diagnosis present

## 2022-12-19 DIAGNOSIS — I110 Hypertensive heart disease with heart failure: Secondary | ICD-10-CM | POA: Diagnosis not present

## 2022-12-19 DIAGNOSIS — Z96643 Presence of artificial hip joint, bilateral: Secondary | ICD-10-CM | POA: Insufficient documentation

## 2022-12-19 DIAGNOSIS — M069 Rheumatoid arthritis, unspecified: Secondary | ICD-10-CM | POA: Diagnosis present

## 2022-12-19 DIAGNOSIS — F419 Anxiety disorder, unspecified: Secondary | ICD-10-CM | POA: Diagnosis present

## 2022-12-19 DIAGNOSIS — F17210 Nicotine dependence, cigarettes, uncomplicated: Secondary | ICD-10-CM | POA: Diagnosis not present

## 2022-12-19 DIAGNOSIS — I5032 Chronic diastolic (congestive) heart failure: Secondary | ICD-10-CM | POA: Insufficient documentation

## 2022-12-19 DIAGNOSIS — Z8673 Personal history of transient ischemic attack (TIA), and cerebral infarction without residual deficits: Secondary | ICD-10-CM | POA: Diagnosis not present

## 2022-12-19 DIAGNOSIS — R0602 Shortness of breath: Secondary | ICD-10-CM | POA: Diagnosis not present

## 2022-12-19 DIAGNOSIS — I480 Paroxysmal atrial fibrillation: Secondary | ICD-10-CM | POA: Diagnosis present

## 2022-12-19 DIAGNOSIS — J449 Chronic obstructive pulmonary disease, unspecified: Secondary | ICD-10-CM | POA: Diagnosis not present

## 2022-12-19 DIAGNOSIS — D509 Iron deficiency anemia, unspecified: Secondary | ICD-10-CM | POA: Diagnosis not present

## 2022-12-19 DIAGNOSIS — Z6835 Body mass index (BMI) 35.0-35.9, adult: Secondary | ICD-10-CM | POA: Insufficient documentation

## 2022-12-19 DIAGNOSIS — F1410 Cocaine abuse, uncomplicated: Secondary | ICD-10-CM | POA: Diagnosis present

## 2022-12-19 DIAGNOSIS — R7989 Other specified abnormal findings of blood chemistry: Secondary | ICD-10-CM | POA: Diagnosis present

## 2022-12-19 DIAGNOSIS — Z87891 Personal history of nicotine dependence: Secondary | ICD-10-CM | POA: Insufficient documentation

## 2022-12-19 DIAGNOSIS — I517 Cardiomegaly: Secondary | ICD-10-CM | POA: Diagnosis not present

## 2022-12-19 DIAGNOSIS — Z96652 Presence of left artificial knee joint: Secondary | ICD-10-CM | POA: Diagnosis not present

## 2022-12-19 DIAGNOSIS — I4892 Unspecified atrial flutter: Secondary | ICD-10-CM

## 2022-12-19 DIAGNOSIS — E66812 Obesity, class 2: Secondary | ICD-10-CM | POA: Diagnosis present

## 2022-12-19 DIAGNOSIS — E669 Obesity, unspecified: Secondary | ICD-10-CM | POA: Insufficient documentation

## 2022-12-19 HISTORY — DX: Elevated urine levels of drugs, medicaments and biological substances: R82.5

## 2022-12-19 HISTORY — DX: Pneumonia, unspecified organism: J18.9

## 2022-12-19 HISTORY — DX: Tachypnea, not elsewhere classified: R06.82

## 2022-12-19 LAB — BASIC METABOLIC PANEL
Anion gap: 11 (ref 5–15)
BUN/Creatinine Ratio: 23 (ref 10–24)
BUN: 17 mg/dL (ref 8–27)
BUN: 17 mg/dL (ref 8–23)
CO2: 28 mmol/L (ref 20–29)
CO2: 28 mmol/L (ref 22–32)
Calcium: 8.7 mg/dL (ref 8.6–10.2)
Calcium: 8.7 mg/dL — ABNORMAL LOW (ref 8.9–10.3)
Chloride: 96 mmol/L (ref 96–106)
Chloride: 98 mmol/L (ref 98–111)
Creatinine, Ser: 0.74 mg/dL — ABNORMAL LOW (ref 0.76–1.27)
Creatinine, Ser: 0.79 mg/dL (ref 0.61–1.24)
GFR, Estimated: >60 mL/min (ref >60)
Glucose, Bld: 117 mg/dL — ABNORMAL HIGH (ref 70–99)
Glucose: 128 mg/dL — ABNORMAL HIGH (ref 70–99)
Potassium: 4.7 mmol/L (ref 3.5–5.2)
Potassium: 5.1 mmol/L (ref 3.5–5.1)
Sodium: 139 mmol/L (ref 134–144)
Sodium: 137 mmol/L (ref 135–145)
eGFR: 97 mL/min/{1.73_m2} (ref >59)

## 2022-12-19 LAB — CBC WITH DIFFERENTIAL/PLATELET
Abs Immature Granulocytes: 0.12 10*3/uL — ABNORMAL HIGH (ref 0.00–0.07)
Basophils Absolute: 0.2 10*3/uL — ABNORMAL HIGH (ref 0.0–0.1)
Basophils Relative: 2 %
Eosinophils Absolute: 0.2 10*3/uL (ref 0.0–0.5)
Eosinophils Relative: 3 %
HCT: 28.3 % — ABNORMAL LOW (ref 39.0–52.0)
Hemoglobin: 7.3 g/dL — ABNORMAL LOW (ref 13.0–17.0)
Immature Granulocytes: 1 %
Lymphocytes Relative: 14 %
Lymphs Abs: 1.2 10*3/uL (ref 0.7–4.0)
MCH: 19.8 pg — ABNORMAL LOW (ref 26.0–34.0)
MCHC: 25.8 g/dL — ABNORMAL LOW (ref 30.0–36.0)
MCV: 76.7 fL — ABNORMAL LOW (ref 80.0–100.0)
Monocytes Absolute: 0.9 10*3/uL (ref 0.1–1.0)
Monocytes Relative: 11 %
Neutro Abs: 6 10*3/uL (ref 1.7–7.7)
Neutrophils Relative %: 69 %
Platelets: 288 10*3/uL (ref 150–400)
RBC: 3.69 MIL/uL — ABNORMAL LOW (ref 4.22–5.81)
RDW: 18.5 % — ABNORMAL HIGH (ref 11.5–15.5)
WBC: 8.6 10*3/uL (ref 4.0–10.5)
nRBC: 0.5 % — ABNORMAL HIGH (ref 0.0–0.2)

## 2022-12-19 LAB — CBC
Hematocrit: 27.1 % — ABNORMAL LOW (ref 37.5–51.0)
Hemoglobin: 7.3 g/dL — ABNORMAL LOW (ref 13.0–17.7)
MCH: 20.1 pg — ABNORMAL LOW (ref 26.6–33.0)
MCHC: 26.9 g/dL — ABNORMAL LOW (ref 31.5–35.7)
MCV: 75 fL — ABNORMAL LOW (ref 79–97)
Platelets: 289 10*3/uL (ref 150–450)
RBC: 3.63 x10E6/uL — ABNORMAL LOW (ref 4.14–5.80)
RDW: 17 % — ABNORMAL HIGH (ref 11.6–15.4)
WBC: 10.1 10*3/uL (ref 3.4–10.8)

## 2022-12-19 LAB — IRON AND TIBC
Iron: 103 ug/dL (ref 45–182)
Saturation Ratios: 21 % (ref 17.9–39.5)
TIBC: 501 ug/dL — ABNORMAL HIGH (ref 250–450)
UIBC: 398 ug/dL

## 2022-12-19 LAB — TYPE AND SCREEN

## 2022-12-19 LAB — FERRITIN: Ferritin: 9 ng/mL — ABNORMAL LOW (ref 24–336)

## 2022-12-19 LAB — HEMOGLOBIN AND HEMATOCRIT, BLOOD
HCT: 29.1 % — ABNORMAL LOW (ref 39.0–52.0)
Hemoglobin: 7.7 g/dL — ABNORMAL LOW (ref 13.0–17.0)

## 2022-12-19 LAB — PREPARE RBC (CROSSMATCH)

## 2022-12-19 LAB — BRAIN NATRIURETIC PEPTIDE: B Natriuretic Peptide: 336.5 pg/mL — ABNORMAL HIGH (ref 0.0–100.0)

## 2022-12-19 LAB — BPAM RBC

## 2022-12-19 LAB — MAGNESIUM: Magnesium: 1.8 mg/dL (ref 1.7–2.4)

## 2022-12-19 MED ORDER — ACETAMINOPHEN 650 MG RE SUPP: 650.0000 mg | Freq: Four times a day (QID) | RECTAL | Status: DC | PRN

## 2022-12-19 MED ORDER — SODIUM CHLORIDE 0.9% IV SOLUTION: Freq: Once | INTRAVENOUS | Status: DC

## 2022-12-19 MED ORDER — DILTIAZEM HCL ER COATED BEADS 120 MG PO CP24
120.0000 mg | ORAL_CAPSULE | Freq: Every day | ORAL | Status: DC
  Administered 2022-12-20: 120 mg via ORAL
  Filled 2022-12-19: qty 1

## 2022-12-19 MED ORDER — TAMSULOSIN HCL 0.4 MG PO CAPS
0.4000 mg | ORAL_CAPSULE | Freq: Every day | ORAL | Status: DC
  Administered 2022-12-19: 0.4 mg via ORAL
  Filled 2022-12-19: qty 1

## 2022-12-19 MED ORDER — ACETAMINOPHEN 325 MG PO TABS: 650.0000 mg | ORAL_TABLET | Freq: Four times a day (QID) | ORAL | Status: DC | PRN

## 2022-12-19 MED ORDER — MOMETASONE FURO-FORMOTEROL FUM 200-5 MCG/ACT IN AERO
2.0000 | INHALATION_SPRAY | Freq: Two times a day (BID) | RESPIRATORY_TRACT | Status: DC
  Administered 2022-12-20: 2 via RESPIRATORY_TRACT
  Filled 2022-12-19: qty 8.8

## 2022-12-19 MED ORDER — PANTOPRAZOLE SODIUM 40 MG PO TBEC
40.0000 mg | DELAYED_RELEASE_TABLET | Freq: Every day | ORAL | Status: DC
  Administered 2022-12-20: 40 mg via ORAL
  Filled 2022-12-19: qty 1

## 2022-12-19 MED ORDER — POLYETHYLENE GLYCOL 3350 17 G PO PACK
17.0000 g | PACK | Freq: Every day | ORAL | Status: DC | PRN
  Administered 2022-12-19: 17 g via ORAL
  Filled 2022-12-19: qty 1

## 2022-12-19 MED ORDER — HYDROXYCHLOROQUINE SULFATE 200 MG PO TABS
200.0000 mg | ORAL_TABLET | Freq: Every day | ORAL | Status: DC
  Administered 2022-12-20: 200 mg via ORAL
  Filled 2022-12-19: qty 1

## 2022-12-19 MED ORDER — AMIODARONE HCL 200 MG PO TABS
200.0000 mg | ORAL_TABLET | Freq: Every day | ORAL | Status: DC
  Administered 2022-12-20: 200 mg via ORAL
  Filled 2022-12-19: qty 1

## 2022-12-19 MED ORDER — SODIUM CHLORIDE 0.9% FLUSH
3.0000 mL | Freq: Two times a day (BID) | INTRAVENOUS | Status: DC
  Administered 2022-12-19 – 2022-12-20 (×3): 3 mL via INTRAVENOUS

## 2022-12-19 MED ORDER — FUROSEMIDE 20 MG PO TABS
40.0000 mg | ORAL_TABLET | Freq: Every day | ORAL | Status: DC
  Administered 2022-12-20: 40 mg via ORAL
  Filled 2022-12-19: qty 2

## 2022-12-19 MED ORDER — LEVALBUTEROL HCL 0.63 MG/3ML IN NEBU: 0.6300 mg | INHALATION_SOLUTION | RESPIRATORY_TRACT | Status: DC | PRN

## 2022-12-19 MED ORDER — RIVAROXABAN 10 MG PO TABS: 20.0000 mg | ORAL_TABLET | Freq: Every day | ORAL | Status: DC

## 2022-12-19 MED ORDER — FUROSEMIDE 10 MG/ML IJ SOLN
20.0000 mg | Freq: Once | INTRAMUSCULAR | Status: AC | PRN
  Administered 2022-12-19: 20 mg via INTRAVENOUS
  Filled 2022-12-19: qty 2

## 2022-12-19 NOTE — Progress Notes (Signed)
Electrophysiology Office Follow up Visit Note:    Date:  12/19/2022   ID:  Brian [name], DOB Apr 28, 1951, MRN [id number]  PCP:  Joycelyn Rua, MD  [facility] Cardiologist:  Donato Schultz, MD  [facility] Electrophysiologist:  Maurice Small, MD    Interval History:    Brian [name] is a 72 y.o. male who presents for a follow up visit.   Last seen 11/11/2022 for atrial fibrillation/flutter by Dr Nelly Laurence. The patient is s/p ablation in January 2024 with recurrence. A repeat ablation is planned for July. He has experienced symptomatic anemia while on anticoagulation in the past and is hesitant to stay on anticoagulation indefinitely if there are acceptable alternatives to achieve stroke risk reduction.  He has recently received a diagnosis of COPD but is not on supplement oxygen. He wonders how much his AF is contributing to his breathing symptoms.  He stopped smoking 2 weeks ago.       Past medical, surgical, social and family history were reviewed.  ROS:   Please see the history of present illness.    All other systems reviewed and are negative.  EKGs/Labs/Other Studies Reviewed:    The following studies were reviewed today:   07/14/2022 CT Cardiac reviewed - appendage anatomy appears amenable to closure with watchman device  EKG Interpretation  Date/Time:  Friday December 18 2022 16:14:26 EDT Ventricular Rate:  97 PR Interval:    QRS Duration: 84 QT Interval:  354 QTC Calculation: 449 R Axis:   80 Text Interpretation: Atrial flutter with variable A-V block Confirmed by Steffanie Dunn [phone]) on 12/19/2022 9:53:30 AM    Physical Exam:    VS:  BP 118/62   Pulse 89   Ht 5\' 9"  (1.753 m)   SpO2 94%   BMI 38.25 kg/m     Wt Readings from Last 3 Encounters:  11/11/22 259 lb (117.5 kg)  10/27/22 255 lb 9.6 oz (115.9 kg)  10/17/22 253 lb 8.5 oz (115 kg)     GEN:  Well nourished, well developed in no acute distress. Obese. CARDIAC: irregularly  irregular, no murmurs, rubs, gallops RESPIRATORY:  Clear to auscultation without rales, wheezing or rhonchi       ASSESSMENT:    1. Persistent atrial fibrillation (HCC)   2. Chronic heart failure with preserved ejection fraction (HFpEF) (HCC)   3. Symptomatic anemia   4. Chronic obstructive pulmonary disease, unspecified COPD type (HCC)    PLAN:    In order of problems listed above:  #Persistent AF/AFL Symptomatic. Associated with his HFpEF. S/p prior ablation in January with repeat planned for July. On amiodarone but has not been effective in maintaining sinus rhythm. On xarelto for stroke ppx. Discussed the role of LAAO in stroke risk mitigation and he is interested in proceeding.  Tentatively planning for Watchman implant ~ 8 weeks after next ablation procedure.  ---------------------  I have seen Brian [name] in the office today who is being considered for a Watchman left atrial appendage closure device. I believe they will benefit from this procedure given their history of atrial fibrillation, CHA2DS2-VASc score of 6 and unadjusted ischemic stroke rate of 9.7% per year. Unfortunately, the patient is not felt to be a long term anticoagulation candidate secondary to symptomatic anemia. The patient's chart has been reviewed and I feel that they would be a candidate for short term oral anticoagulation after Watchman implant.   It is my belief that after undergoing a LAA closure procedure, Brian Lamas  [name] will not need long term anticoagulation which eliminates anticoagulation side effects and major bleeding risk.   Procedural risks for the Watchman implant have been reviewed with the patient including a 0.5% risk of stroke, <1% risk of perforation and <1% risk of device embolization. Other risks include bleeding, vascular damage, tamponade, worsening renal function, and death. The patient understands these risk and wishes to proceed.     The published clinical data on the  safety and effectiveness of WATCHMAN include but are not limited to the following: - Holmes DR, Everlene Farrier, Sick P et al. for the PROTECT AF Investigators. Percutaneous closure of the left atrial appendage versus warfarin therapy for prevention of stroke in patients with atrial fibrillation: a randomised non-inferiority trial. Lancet 2009; 374: 534-42. Everlene Farrier, Doshi SK, Isa Rankin D et al. on behalf of the PROTECT AF Investigators. Percutaneous Left Atrial Appendage Closure for Stroke Prophylaxis in Patients With Atrial Fibrillation 2.3-Year Follow-up of the PROTECT AF (Watchman Left Atrial Appendage System for Embolic Protection in Patients With Atrial Fibrillation) Trial. Circulation 2013; 127:720-729. - Alli O, Doshi S,  Kar S, Reddy VY, Sievert H et al. Quality of Life Assessment in the Randomized PROTECT AF (Percutaneous Closure of the Left Atrial Appendage Versus Warfarin Therapy for Prevention of Stroke in Patients With Atrial Fibrillation) Trial of Patients at Risk for Stroke With Nonvalvular Atrial Fibrillation. J Am Coll Cardiol 2013; 61:1790-8. Aline August DR, Mia [place], Price M, Whisenant B, Sievert H, Doshi S, Huber K, Reddy V. Prospective randomized evaluation of the Watchman left atrial appendage Device in patients with atrial fibrillation versus long-term warfarin therapy; the PREVAIL trial. Journal of the [employer] of Cardiology, Vol. 4, No. 1, 2014, 1-11. - Kar S, Doshi SK, Sadhu A, Horton R, Osorio J et al. Primary outcome evaluation of a next-generation left atrial appendage closure device: results from the PINNACLE FLX trial. Circulation 2021;143(18)1754-1762.    After today's visit with the patient which was dedicated solely for shared decision making visit regarding LAA closure device, the patient decided to proceed with the LAA appendage closure procedure scheduled to be done in the near future at [facility]. .  HAS-BLED score 4 Hypertension Yes  Abnormal renal  and liver function (Dialysis, transplant, Cr >2.26 mg/dL /Cirrhosis or Bilirubin >2x Normal or AST/ALT/AP >3x Normal) No  Stroke Yes  Bleeding Yes  Labile INR (Unstable/high INR) No  Elderly (>65) Yes  Drugs or alcohol (? 8 drinks/week, anti-plt or NSAID) No   CHA2DS2-VASc Score = 6  The patient's score is based upon: CHF History: 1 HTN History: 1 Diabetes History: 0 Stroke History: 2 Vascular Disease History: 1 Age Score: 1 Gender Score: 0    Signed, Steffanie Dunn, MD, [facility], [facility] 12/19/2022 9:58 AM    Electrophysiology [facility]

## 2022-12-19 NOTE — ED Triage Notes (Signed)
Pt arrived POV from home c/o having labs drawn at the heart doctor yesterday and then they called today stating his hgb is 7.3. Pt states he has a hx of a-fib and denies any pain at this time.

## 2022-12-19 NOTE — ED Provider Notes (Signed)
[place] EMERGENCY DEPARTMENT AT [facility] Provider Note   CSN: [id number] Arrival date & time: 12/19/22  1104     History  Chief Complaint  Patient presents with   Abnormal Lab    Brian [name] is a 72 y.o. male.  Patient seen by cardiology yesterday.  Had lab work done.  Hemoglobin came in low at 7.3.  And patient was contacted and referred him.  Patient is followed by them for history of atrial fibrillation flutter also seen by electrophysiologist and his main cardiologist is [employer].  Patient status post ablation in January of this year with recurrence a repeat ablation is planned for July.  He is experience symptomatic anemia while on anticoagulation no gross blood in his stools but thought that he could be losing some blood there.  Patient would prefer to come off of anticoagulation.  And patient is currently on Pacerone and Xarelto.  Patient is also on Lasix 40 mg daily for congestive heart failure.  Patient states has been a little bit of shortness of breath and has felt a little lightheaded denies any chest pain.  Past medical history significant for the atrial flutter fibrillation congestive heart failure patient also says he is got a little bit of COPD.  Oxygen saturations here today are very reassuring around 93%.  Patient is not on oxygen for his COPD.  Patient stopped smoking just 2 weeks ago.  Patient has some leg swelling but he says it is not severe.  Cardiology assessment from yesterday with chronic heart failure preserved ejection fraction symptomatic anemia and chronic obstructive pulmonary disease persistent atrial fibrillation and atrial flutter.  Patient's primary care doctor is [place] in [place].       Home Medications Prior to Admission medications   Medication Sig Start Date End Date Taking? Authorizing Provider  acetaminophen (TYLENOL) 500 MG tablet Take 500 mg by mouth as needed (For Headache).    [provider]  amiodarone  (PACERONE) 200 MG tablet Take 1 tablet (200 mg total) by mouth daily. Please keep upcoming appt.with Cardiologist for future refills. Thank You. 10/27/22   Sheilah Pigeon, PA-C  budesonide-formoterol [facility]) 160-4.5 MCG/ACT inhaler Inhale 2 puffs into the lungs 2 (two) times daily. 10/17/22   Rai, Delene Ruffini, MD  diltiazem (CARDIZEM CD) 120 MG 24 hr capsule Take 1 capsule (120 mg total) by mouth daily. 01/08/22   Jake Bathe, MD  folic acid (FOLVITE) 1 MG tablet Take 1 mg by mouth daily. 02/14/22   [provider]  furosemide (LASIX) 40 MG tablet Take 1 tablet (40 mg total) by mouth daily. 01/19/22   Jake Bathe, MD  golimumab (SIMPONI ARIA) 50 MG/4ML SOLN injection Inject 50 mg into the vein every 8 (eight) weeks.    [provider]  hydroxychloroquine (PLAQUENIL) 200 MG tablet Take 200 mg by mouth daily.    [provider]  KLOR-CON M20 20 MEQ tablet TAKE 1 TABLET BY MOUTH EVERY DAY 01/21/22   Jake Bathe, MD  levalbuterol [facility] HFA) 45 MCG/ACT inhaler Inhale 1-2 puffs into the lungs every 4 (four) hours as needed for wheezing or shortness of breath. 10/17/22 10/17/23  Rai, Delene Ruffini, MD  methotrexate (RHEUMATREX) 2.5 MG tablet Take 25 mg by mouth every Friday. 10 tablets 1 time a week on Fridays in the evening 04/26/17   [provider]  pantoprazole (PROTONIX) 40 MG tablet Take 1 tablet (40 mg total) by mouth daily. 10/17/22 02/14/23  Rai, Delene Ruffini, MD  predniSONE (DELTASONE) 10 MG tablet Prednisone dosing: Take  Prednisone 40mg  (4 tabs) x 3 days, then taper to 30mg  (3 tabs) x 3 days, then 20mg  (2 tabs) x 3days, then 10mg  (1 tab) x 3days, then OFF. 10/17/22   Rai, Ripudeep K, MD  rivaroxaban (XARELTO) 20 MG TABS tablet TAKE 1 TABLET BY MOUTH DAILY WITH SUPPER. 09/28/22   Mealor, Roberts Gaudy, MD  tamsulosin (FLOMAX) 0.4 MG CAPS capsule Take 0.4 mg by mouth at bedtime.    [provider]      Allergies    Aquacel-ag surgical hydrofiber [wound  dressings] and Tape    Review of Systems   Review of Systems  Constitutional:  Negative for chills and fever.  HENT:  Negative for ear pain and sore throat.   Eyes:  Negative for pain and visual disturbance.  Respiratory:  Positive for shortness of breath. Negative for cough.   Cardiovascular:  Negative for chest pain and palpitations.  Gastrointestinal:  Negative for abdominal pain and vomiting.  Genitourinary:  Negative for dysuria and hematuria.  Musculoskeletal:  Negative for arthralgias and back pain.  Skin:  Negative for color change and rash.  Neurological:  Positive for light-headedness. Negative for seizures and syncope.  All other systems reviewed and are negative.   Physical Exam Updated Vital Signs BP 120/63   Pulse 84   Temp 98.3 F (36.8 C) (Oral)   Resp 18   Ht 1.778 m (5\' 10" )   Wt 115.7 kg   SpO2 94%   BMI 36.59 kg/m  Physical Exam Vitals and nursing note reviewed.  Constitutional:      General: He is not in acute distress.    Appearance: Normal appearance. He is well-developed.  HENT:     Head: Normocephalic and atraumatic.     Mouth/Throat:     Mouth: Mucous membranes are moist.  Eyes:     Extraocular Movements: Extraocular movements intact.     Conjunctiva/sclera: Conjunctivae normal.     Pupils: Pupils are equal, round, and reactive to light.  Cardiovascular:     Rate and Rhythm: Normal rate. Rhythm irregular.     Heart sounds: No murmur heard. Pulmonary:     Effort: Pulmonary effort is normal. No respiratory distress.     Breath sounds: Normal breath sounds.  Abdominal:     Palpations: Abdomen is soft.     Tenderness: There is no abdominal tenderness.  Musculoskeletal:        General: No swelling.     Cervical back: Normal range of motion and neck supple.     Right lower leg: Edema present.     Left lower leg: Edema present.  Skin:    General: Skin is warm and dry.     Capillary Refill: Capillary refill takes less than 2 seconds.   Neurological:     General: No focal deficit present.     Mental Status: He is alert and oriented to person, place, and time.  Psychiatric:        Mood and Affect: Mood normal.     ED Results / Procedures / Treatments   Labs (all labs ordered are listed, but only abnormal results are displayed) Labs Reviewed  CBC WITH DIFFERENTIAL/PLATELET - Abnormal; Notable for the following components:      Result Value   RBC 3.69 (*)    Hemoglobin 7.3 (*)    HCT 28.3 (*)    MCV 76.7 (*)    [facility]  19.8 (*)    MCHC 25.8 (*)    RDW 18.5 (*)    nRBC 0.5 (*)    Basophils Absolute 0.2 (*)    Abs Immature Granulocytes 0.12 (*)    All other components within normal limits  BASIC METABOLIC PANEL - Abnormal; Notable for the following components:   Glucose, Bld 117 (*)    Calcium 8.7 (*)    All other components within normal limits  TYPE AND SCREEN  PREPARE RBC (CROSSMATCH)    EKG None  Radiology No results found.  Procedures Procedures    Medications Ordered in ED Medications  0.9 %  sodium chloride infusion (Manually program via Guardrails IV Fluids) (has no administration in time range)    ED Course/ Medical Decision Making/ A&P                             Medical Decision Making Amount and/or Complexity of Data Reviewed Labs: ordered. Radiology: ordered.  Risk Prescription drug management. Decision regarding hospitalization.   CRITICAL CARE Performed by: Vanetta Mulders Total critical care time: 40 minutes Critical care time was exclusive of separately billable procedures and treating other patients. Critical care was necessary to treat or prevent imminent or life-threatening deterioration. Critical care was time spent personally by me on the following activities: development of treatment plan with patient and/or surrogate as well as nursing, discussions with consultants, evaluation of patient's response to treatment, examination of patient, obtaining history from patient  or surrogate, ordering and performing treatments and interventions, ordering and review of laboratory studies, ordering and review of radiographic studies, pulse oximetry and re-evaluation of patient's condition.  Patient without any gross blood in his bowel movements.  But could have slow GI blood loss resulting in his anemia.  Hemoglobin again today is 7.3.  With his history of cardiac disease and him being a little symptomatic we will get him admitted for blood transfusion.  He also has a colonoscopy planned to evaluate that.  No significant shortness of breath here oxygen sats are good but will get portable chest x-ray.  Ordered 2 units of blood to be done slowly will contact hospitalist for admission his primary care doctor is [address] in [place].  Final Clinical Impression(s) / ED Diagnoses Final diagnoses:  Symptomatic anemia    Rx / DC Orders ED Discharge Orders     None         Vanetta Mulders, MD 12/19/22 1313

## 2022-12-19 NOTE — Telephone Encounter (Signed)
Called the patient this morning at 10:00am to discuss lab results. Hgb has dropped and is now 7.3. While he has a history of anemia, this continues to trend down and may explain some of his shortness of breath symptoms.  I recommended he present to the emergency department for admission and PRBC transfusion. He will need GI evaluation while in patient.  Patient expressed understanding of my recommendation and was going to present to the ER.  Sheria Lang T. Lalla Brothers, MD, [facility], [facility] Cardiac Electrophysiology

## 2022-12-19 NOTE — ED Notes (Signed)
ED TO INPATIENT HANDOFF REPORT  ED Nurse Name and Phone #: Lowanda Foster [phone]  S Name/Age/Gender Brian [name] 72 y.o. male Room/Bed: 008C/008C  Code Status   Code Status: Full Code  Home/SNF/Other Home Patient oriented to: self, place, time, and situation Is this baseline? Yes   Triage Complete: Triage complete  Chief Complaint Symptomatic anemia [D64.9]  Triage Note Pt arrived POV from home c/o having labs drawn at the heart doctor yesterday and then they called today stating his hgb is 7.3. Pt states he has a hx of a-fib and denies any pain at this time.    Allergies Allergies  Allergen Reactions   Aquacel-Ag Surgical Hydrofiber [Wound Dressings] Other (See Comments)    Tears skin   Tape Rash and Other (See Comments)    Paper tape is preferred, please!! Adhesive tape tears skin    Level of Care/Admitting Diagnosis ED Disposition     ED Disposition  Admit   Condition  --   Comment  Hospital Area: [facility] [[id number]]  Level of Care: Telemetry Cardiac [[id number]]  May place patient in observation at [facility] or [facility] if equivalent level of care is available:: No  Covid Evaluation: Asymptomatic - no recent exposure (last 10 days) testing not required  Diagnosis: Symptomatic anemia [5784696]  Admitting Physician: Synetta Fail [[id number]]  Attending Physician: Synetta Fail [[id number]]          B Medical/Surgery History Past Medical History:  Diagnosis Date   Anemia    none since 2019   Anxiety    Atrial flutter (HCC)    a. 08/2012   CAP (community acquired pneumonia) 09/30/2016   Cellulitis of left leg 11/29/2016   CHF (congestive heart failure) (HCC)    chronic diastolic chf, pt denies   Community acquired pneumonia of left lower lobe of lung    GERD (gastroesophageal reflux disease)    Gout    pt denies   History of kidney stones    passed   Hypertension    Obesity    Paroxysmal atrial fibrillation (HCC)     Pneumonia 09/2016   Positive urine drug screen    Rheumatoid arthritis (HCC)    "a little bit qwhere" (07/22/2017)   Sepsis (HCC) 11/29/2016   Shortness of breath    with heavy  exertion   Tachypnea    Tobacco abuse    Past Surgical History:  Procedure Laterality Date   A-FLUTTER ABLATION N/A 12/29/2016   Procedure: A-Flutter Ablation;  Surgeon: Hillis Range, MD;  Location: [facility] INVASIVE CV LAB;  Service: Cardiovascular;  Laterality: N/A;   ATRIAL FIBRILLATION ABLATION N/A 07/20/2022   Procedure: ATRIAL FIBRILLATION ABLATION;  Surgeon: Maurice Small, MD;  Location: [facility] INVASIVE CV LAB;  Service: Cardiovascular;  Laterality: N/A;   CARDIOVERSION N/A 11/05/2016   Procedure: CARDIOVERSION;  Surgeon: Quintella Reichert, MD;  Location: [facility] ENDOSCOPY;  Service: Cardiovascular;  Laterality: N/A;   CARDIOVERSION N/A 06/09/2022   Procedure: CARDIOVERSION;  Surgeon: Jake Bathe, MD;  Location: [facility] ENDOSCOPY;  Service: Cardiovascular;  Laterality: N/A;   CARDIOVERSION N/A 09/14/2022   Procedure: CARDIOVERSION;  Surgeon: Christell Constant, MD;  Location: [facility] ENDOSCOPY;  Service: Cardiovascular;  Laterality: N/A;   IR THORACENTESIS ASP PLEURAL SPACE W/IMG GUIDE  10/01/2016   JOINT REPLACEMENT     Right hip and left knee   MICROLARYNGOSCOPY  09/02/2007   with excision of right vocal cord mass, Dr. Pollyann Kennedy   PROSTATE BIOPSY N/A  02/22/2020   Procedure: BIOPSY TRANSRECTAL ULTRASONIC PROSTATE (TUBP);  Surgeon: Bjorn Pippin, MD;  Location: [facility];  Service: Urology;  Laterality: N/A;   TOTAL HIP ARTHROPLASTY Right 11/17/2017   Procedure: RIGHT TOTAL HIP ARTHROPLASTY ANTERIOR APPROACH;  Surgeon: Tarry Kos, MD;  Location: [facility] OR;  Service: Orthopedics;  Laterality: Right;   TOTAL HIP ARTHROPLASTY Left 03/07/2018   Procedure: LEFT TOTAL HIP ARTHROPLASTY ANTERIOR APPROACH;  Surgeon: Tarry Kos, MD;  Location: [facility] OR;  Service: Orthopedics;  Laterality: Left;   TOTAL KNEE ARTHROPLASTY Left  07/22/2017   Procedure: LEFT TOTAL KNEE ARTHROPLASTY;  Surgeon: Tarry Kos, MD;  Location: [facility] OR;  Service: Orthopedics;  Laterality: Left;   TRANSURETHRAL RESECTION OF BLADDER TUMOR N/A 04/02/2020   Procedure: RESTAGING TRANSURETHRAL RESECTION OF BLADDER TUMOR (TURBT);  Surgeon: Bjorn Pippin, MD;  Location: [facility];  Service: Urology;  Laterality: N/A;  GENERAL ANESTHESIA WIHT PARALYSIS   TRANSURETHRAL RESECTION OF BLADDER TUMOR WITH MITOMYCIN-C N/A 02/22/2020   Procedure: TRANSURETHRAL RESECTION OF BLADDER TUMOR WITH   GEMCITABINE;  Surgeon: Bjorn Pippin, MD;  Location: [facility];  Service: Urology;  Laterality: N/A;     A IV Location/Drains/Wounds Patient Lines/Drains/Airways Status     Active Line/Drains/Airways     Name Placement date Placement time Site Days   Peripheral IV 12/19/22 20 G Left;Posterior Hand 12/19/22  1204  Hand  less than 1            Intake/Output Last 24 hours No intake or output data in the 24 hours ending 12/19/22 1357  Labs/Imaging Results for orders placed or performed during the hospital encounter of 12/19/22 (from the past 48 hour(s))  CBC with Differential/Platelet     Status: Abnormal   Collection Time: 12/19/22 11:59 AM  Result Value Ref Range   WBC 8.6 4.0 - 10.5 K/uL   RBC 3.69 (L) 4.22 - 5.81 MIL/uL   Hemoglobin 7.3 (L) 13.0 - 17.0 g/dL    Comment: Reticulocyte Hemoglobin testing may be clinically indicated, consider ordering this additional test [id number]    HCT 28.3 (L) 39.0 - 52.0 %   MCV 76.7 (L) 80.0 - 100.0 fL   MCH 19.8 (L) 26.0 - 34.0 pg   MCHC 25.8 (L) 30.0 - 36.0 g/dL   RDW 27.2 (H) 53.6 - 64.4 %   Platelets 288 150 - 400 K/uL   nRBC 0.5 (H) 0.0 - 0.2 %   Neutrophils Relative % 69 %   Neutro Abs 6.0 1.7 - 7.7 K/uL   Lymphocytes Relative 14 %   Lymphs Abs 1.2 0.7 - 4.0 K/uL   Monocytes Relative 11 %   Monocytes Absolute 0.9 0.1 - 1.0 K/uL   Eosinophils Relative 3 %   Eosinophils  Absolute 0.2 0.0 - 0.5 K/uL   Basophils Relative 2 %   Basophils Absolute 0.2 (H) 0.0 - 0.1 K/uL   Immature Granulocytes 1 %   Abs Immature Granulocytes 0.12 (H) 0.00 - 0.07 K/uL    Comment: Performed at [facility], [address], [place], [place] [address]  Basic metabolic panel     Status: Abnormal   Collection Time: 12/19/22 11:59 AM  Result Value Ref Range   Sodium 137 135 - 145 mmol/L   Potassium 5.1 3.5 - 5.1 mmol/L    Comment: HEMOLYSIS AT THIS LEVEL MAY AFFECT RESULT   Chloride 98 98 - 111 mmol/L   CO2 28 22 - 32 mmol/L  Glucose, Bld 117 (H) 70 - 99 mg/dL    Comment: Glucose reference range applies only to samples taken after fasting for at least 8 hours.   BUN 17 8 - 23 mg/dL   Creatinine, Ser 6.57 0.61 - 1.24 mg/dL   Calcium 8.7 (L) 8.9 - 10.3 mg/dL   GFR, Estimated >84 >69 mL/min    Comment: (NOTE) Calculated using the CKD-EPI Creatinine Equation (2021)    Anion gap 11 5 - 15    Comment: Performed at [facility], [address], [place], [place] [address]  Type and screen Ordered by PROVIDER DEFAULT     Status: None (Preliminary result)   Collection Time: 12/19/22 12:24 PM  Result Value Ref Range   ABO/RH(D) A POS    Antibody Screen NEG    Sample Expiration      12/22/2022,2359 Performed at [facility], [address] [place], [place] [address]    Unit Number [id number]    Blood Component Type RED CELLS,LR    Unit division 00    Status of Unit ALLOCATED    Transfusion Status OK TO TRANSFUSE    Crossmatch Result Compatible    Unit Number [id number]    Blood Component Type RED CELLS,LR    Unit division 00    Status of Unit ALLOCATED    Transfusion Status OK TO TRANSFUSE    Crossmatch Result Compatible   Prepare RBC (crossmatch)     Status: None   Collection Time: 12/19/22 12:54 PM  Result Value Ref Range   Order Confirmation      ORDER PROCESSED BY BLOOD BANK Performed at [facility], [address] [place],  [place] [address]    DG Chest Port 1 View  Result Date: 12/19/2022 CLINICAL DATA:  CHF SOB EXAM: PORTABLE CHEST 1 VIEW COMPARISON:  CT Chest 11/19/22 FINDINGS: No large pleural effusion. No pneumothorax. Cardiomegaly. No focal airspace opacity. No radiographically apparent displaced rib fractures. Visualized upper abdomen is unremarkable. Prominent bilateral interstitial opacities could represent mild pulmonary venous congestion. IMPRESSION: Cardiomegaly with possible mild pulmonary venous congestion. Electronically Signed   By: Lorenza Cambridge M.D.   On: 12/19/2022 13:17    Pending Labs Unresulted Labs (From admission, onward)     Start     Ordered   12/20/22 0500  Basic metabolic panel  Tomorrow morning,   R        12/19/22 1339   12/20/22 0500  CBC  Tomorrow morning,   R        12/19/22 1339   12/19/22 1500  Iron and TIBC  Once,   AD        12/19/22 1500   12/19/22 1500  Ferritin  Once,   AD        12/19/22 1500   12/19/22 1338  Magnesium  Add-on,   AD        12/19/22 1339   12/19/22 1338  Brain natriuretic peptide  Add-on,   AD        12/19/22 1339   12/19/22 1338  Occult blood card to lab, stool  Once,   R        12/19/22 1339            Vitals/Pain Today's Vitals   12/19/22 1140 12/19/22 1144 12/19/22 1215  BP:  131/68 120/63  Pulse:  88 84  Resp:  18 18  Temp:  98.3 F (36.8 C)   TempSrc:  Oral  SpO2:  97% 94%  Weight: 115.7 kg    Height: 5\' 10"  (1.778 m)    PainSc: 0-No pain      Isolation Precautions No active isolations  Medications Medications  0.9 %  sodium chloride infusion (Manually program via Guardrails IV Fluids) (has no administration in time range)  amiodarone (PACERONE) tablet 200 mg (has no administration in time range)  furosemide (LASIX) tablet 40 mg (has no administration in time range)  pantoprazole (PROTONIX) EC tablet 40 mg (has no administration in time range)  tamsulosin (FLOMAX) capsule 0.4 mg (has no administration in time range)  sodium  chloride flush (NS) 0.9 % injection 3 mL (has no administration in time range)  acetaminophen (TYLENOL) tablet 650 mg (has no administration in time range)    Or  acetaminophen (TYLENOL) suppository 650 mg (has no administration in time range)  polyethylene glycol (MIRALAX / GLYCOLAX) packet 17 g (has no administration in time range)  furosemide (LASIX) injection 20 mg (has no administration in time range)    Mobility walks     Focused Assessments    R Recommendations: See Admitting Provider Note  Report given to:   Additional Notes:

## 2022-12-19 NOTE — H&P (Addendum)
History and Physical   Brian [name]:[id number] DOB: 1950-08-30 DOA: 12/19/2022  PCP: Brian Rua, MD   Patient coming from: Home  Chief Complaint: Abnormal lab  HPI: Brian [name] is a 72 y.o. male with medical history significant of TIA, CVA, A-fib, COPD, CHF, RA, obesity, gout, cocaine use, anxiety, anemia presenting with abnormal labs from cardiologist office.  Patient was at his electrophysiologist office yesterday due to ongoing issues with atrial fibrillation despite ablation.  Plan for repeat ablation this summer followed by Watchman procedure.  Labs checked during that visit came back with hemoglobin of 7.3 which is down from previous of 8.3.  His EP provider called him and recommended him go to the ED for further evaluation and blood transfusion with goal hemoglobin of 8 and CHF and to evaluate for possible bleeding as he is on Xarelto.  Patient denies any black or bloody stools.  Does report some bloody streaks when wiping which she attributes to hard stools and history of hemorrhoids.  He does report some mild shortness of breath and lightheadedness.  He denies fevers, chills, chest pain, abdominal pain, constipation, diarrhea, nausea, vomiting.  States MTX held recently with concern this could be contributing to anemia.  ED Course: Signs in the ED stable.  Lab workup included BMP with glucose 117, calcium 8.7.  CBC showed microcytic anemia with hemoglobin of 7.3 stable from yesterday but down from previous baseline of around 8.3.  Chest x-ray showed cardiomegaly with mild pulmonary vascular congestion possible.  2 units PRBCs have been ordered in the ED.  Review of Systems: As per HPI otherwise all other systems reviewed and are negative.  Past Medical History:  Diagnosis Date   Anemia    none since 2019   Anxiety    Atrial flutter (HCC)    a. 08/2012   CAP (community acquired pneumonia) 09/30/2016   Cellulitis of left leg 11/29/2016   CHF (congestive heart  failure) (HCC)    chronic diastolic chf, pt denies   Community acquired pneumonia of left lower lobe of lung    GERD (gastroesophageal reflux disease)    Gout    pt denies   History of kidney stones    passed   Hypertension    Obesity    Paroxysmal atrial fibrillation (HCC)    Pneumonia 09/2016   Positive urine drug screen    Rheumatoid arthritis (HCC)    "a little bit qwhere" (07/22/2017)   Sepsis (HCC) 11/29/2016   Shortness of breath    with heavy  exertion   Tachypnea    Tobacco abuse     Past Surgical History:  Procedure Laterality Date   A-FLUTTER ABLATION N/A 12/29/2016   Procedure: A-Flutter Ablation;  Surgeon: Hillis Range, MD;  Location: [facility];  Service: Cardiovascular;  Laterality: N/A;   ATRIAL FIBRILLATION ABLATION N/A 07/20/2022   Procedure: ATRIAL FIBRILLATION ABLATION;  Surgeon: Maurice Small, MD;  Location: [facility];  Service: Cardiovascular;  Laterality: N/A;   CARDIOVERSION N/A 11/05/2016   Procedure: CARDIOVERSION;  Surgeon: Quintella Reichert, MD;  Location: [facility] ENDOSCOPY;  Service: Cardiovascular;  Laterality: N/A;   CARDIOVERSION N/A 06/09/2022   Procedure: CARDIOVERSION;  Surgeon: Jake Bathe, MD;  Location: [facility] ENDOSCOPY;  Service: Cardiovascular;  Laterality: N/A;   CARDIOVERSION N/A 09/14/2022   Procedure: CARDIOVERSION;  Surgeon: Christell Constant, MD;  Location: [facility] ENDOSCOPY;  Service: Cardiovascular;  Laterality: N/A;   IR THORACENTESIS ASP PLEURAL SPACE W/IMG GUIDE  10/01/2016   JOINT REPLACEMENT     Right hip and left knee   MICROLARYNGOSCOPY  09/02/2007   with excision of right vocal cord mass, Dr. Pollyann Kennedy   PROSTATE BIOPSY N/A 02/22/2020   Procedure: BIOPSY TRANSRECTAL ULTRASONIC PROSTATE (TUBP);  Surgeon: Bjorn Pippin, MD;  Location: [facility];  Service: Urology;  Laterality: N/A;   TOTAL HIP ARTHROPLASTY Right 11/17/2017   Procedure: RIGHT TOTAL HIP ARTHROPLASTY ANTERIOR APPROACH;  Surgeon: Tarry Kos,  MD;  Location: [facility] OR;  Service: Orthopedics;  Laterality: Right;   TOTAL HIP ARTHROPLASTY Left 03/07/2018   Procedure: LEFT TOTAL HIP ARTHROPLASTY ANTERIOR APPROACH;  Surgeon: Tarry Kos, MD;  Location: [facility] OR;  Service: Orthopedics;  Laterality: Left;   TOTAL KNEE ARTHROPLASTY Left 07/22/2017   Procedure: LEFT TOTAL KNEE ARTHROPLASTY;  Surgeon: Tarry Kos, MD;  Location: [facility] OR;  Service: Orthopedics;  Laterality: Left;   TRANSURETHRAL RESECTION OF BLADDER TUMOR N/A 04/02/2020   Procedure: RESTAGING TRANSURETHRAL RESECTION OF BLADDER TUMOR (TURBT);  Surgeon: Bjorn Pippin, MD;  Location: [facility];  Service: Urology;  Laterality: N/A;  GENERAL ANESTHESIA WIHT PARALYSIS   TRANSURETHRAL RESECTION OF BLADDER TUMOR WITH MITOMYCIN-C N/A 02/22/2020   Procedure: TRANSURETHRAL RESECTION OF BLADDER TUMOR WITH   GEMCITABINE;  Surgeon: Bjorn Pippin, MD;  Location: [facility];  Service: Urology;  Laterality: N/A;    Social History  reports that he has been smoking cigarettes. He has a 22.50 pack-year smoking history. He has never used smokeless tobacco. He reports current alcohol use. He reports that he does not use drugs.  Allergies  Allergen Reactions   Aquacel-Ag Surgical Hydrofiber [Wound Dressings] Other (See Comments)    Tears skin   Tape Rash and Other (See Comments)    Paper tape is preferred, please!! Adhesive tape tears skin    Family History  Problem Relation Age of Onset   Cancer Mother    Heart disease Father    Heart attack Father    Cancer Father    Diabetes Sister   Reviewed on admission  Prior to Admission medications   Medication Sig Start Date End Date Taking? Authorizing Provider  acetaminophen (TYLENOL) 500 MG tablet Take 500 mg by mouth as needed (For Headache).    [provider]  amiodarone (PACERONE) 200 MG tablet Take 1 tablet (200 mg total) by mouth daily. Please keep upcoming appt.with Cardiologist for future refills. Thank You.  10/27/22   Sheilah Pigeon, PA-C  budesonide-formoterol [facility]) 160-4.5 MCG/ACT inhaler Inhale 2 puffs into the lungs 2 (two) times daily. 10/17/22   Rai, Delene Ruffini, MD  diltiazem (CARDIZEM CD) 120 MG 24 hr capsule Take 1 capsule (120 mg total) by mouth daily. 01/08/22   Jake Bathe, MD  folic acid (FOLVITE) 1 MG tablet Take 1 mg by mouth daily. 02/14/22   [provider]  furosemide (LASIX) 40 MG tablet Take 1 tablet (40 mg total) by mouth daily. 01/19/22   Jake Bathe, MD  golimumab (SIMPONI ARIA) 50 MG/4ML SOLN injection Inject 50 mg into the vein every 8 (eight) weeks.    [provider]  hydroxychloroquine (PLAQUENIL) 200 MG tablet Take 200 mg by mouth daily.    [provider]  KLOR-CON M20 20 MEQ tablet TAKE 1 TABLET BY MOUTH EVERY DAY 01/21/22   Jake Bathe, MD  levalbuterol [facility] HFA) 45 MCG/ACT inhaler Inhale 1-2 puffs into the lungs every 4 (four) hours as needed for wheezing  or shortness of breath. 10/17/22 10/17/23  Rai, Delene Ruffini, MD  methotrexate (RHEUMATREX) 2.5 MG tablet Take 25 mg by mouth every Friday. 10 tablets 1 time a week on Fridays in the evening 04/26/17   [provider]  pantoprazole (PROTONIX) 40 MG tablet Take 1 tablet (40 mg total) by mouth daily. 10/17/22 02/14/23  Rai, Delene Ruffini, MD  predniSONE (DELTASONE) 10 MG tablet Prednisone dosing: Take  Prednisone 40mg  (4 tabs) x 3 days, then taper to 30mg  (3 tabs) x 3 days, then 20mg  (2 tabs) x 3days, then 10mg  (1 tab) x 3days, then OFF. 10/17/22   Rai, Ripudeep K, MD  rivaroxaban (XARELTO) 20 MG TABS tablet TAKE 1 TABLET BY MOUTH DAILY WITH SUPPER. 09/28/22   Mealor, Roberts Gaudy, MD  tamsulosin (FLOMAX) 0.4 MG CAPS capsule Take 0.4 mg by mouth at bedtime.    [provider]    Physical Exam: Vitals:   12/19/22 1140 12/19/22 1144 12/19/22 1215  BP:  131/68 120/63  Pulse:  88 84  Resp:  18 18  Temp:  98.3 F (36.8 C)   TempSrc:  Oral   SpO2:  97% 94%  Weight: 115.7  kg    Height: 5\' 10"  (1.778 m)      Physical Exam Constitutional:      General: He is not in acute distress.    Appearance: Normal appearance. He is obese.  HENT:     Head: Normocephalic and atraumatic.     Mouth/Throat:     Mouth: Mucous membranes are moist.     Pharynx: Oropharynx is clear.  Eyes:     Extraocular Movements: Extraocular movements intact.     Pupils: Pupils are equal, round, and reactive to light.  Cardiovascular:     Rate and Rhythm: Normal rate and regular rhythm.     Pulses: Normal pulses.     Heart sounds: Normal heart sounds.  Pulmonary:     Effort: Pulmonary effort is normal. No respiratory distress.     Breath sounds: Normal breath sounds.  Abdominal:     General: Bowel sounds are normal. There is no distension.     Palpations: Abdomen is soft.     Tenderness: There is no abdominal tenderness.  Musculoskeletal:        General: No swelling or deformity.  Skin:    General: Skin is warm and dry.  Neurological:     General: No focal deficit present.     Mental Status: Mental status is at baseline.    Labs on Admission: I have personally reviewed following labs and imaging studies  CBC: Recent Labs  Lab 12/18/22 1619 12/19/22 1159  WBC 10.1 8.6  NEUTROABS  --  6.0  HGB 7.3* 7.3*  HCT 27.1* 28.3*  MCV 75* 76.7*  PLT 289 288    Basic Metabolic Panel: Recent Labs  Lab 12/18/22 1619 12/19/22 1159  NA 139 137  K 4.7 5.1  CL 96 98  CO2 28 28  GLUCOSE 128* 117*  BUN 17 17  CREATININE 0.74* 0.79  CALCIUM 8.7 8.7*    GFR: Estimated Creatinine Clearance: 107.9 mL/min (by C-G formula based on SCr of 0.79 mg/dL).  Liver Function Tests: No results for input(s): "AST", "ALT", "ALKPHOS", "BILITOT", "PROT", "ALBUMIN" in the last 168 hours.  Urine analysis:    Component Value Date/Time   COLORURINE RED (A) 05/09/2018 1217   APPEARANCEUR CLOUDY (A) 05/09/2018 1217   LABSPEC 1.025 05/09/2018 1217   PHURINE 6.5 05/09/2018 1217  GLUCOSEU  (A) 05/09/2018 1217    TEST NOT REPORTED DUE TO COLOR INTERFERENCE OF URINE PIGMENT   HGBUR (A) 05/09/2018 1217    TEST NOT REPORTED DUE TO COLOR INTERFERENCE OF URINE PIGMENT   BILIRUBINUR (A) 05/09/2018 1217    TEST NOT REPORTED DUE TO COLOR INTERFERENCE OF URINE PIGMENT   KETONESUR (A) 05/09/2018 1217    TEST NOT REPORTED DUE TO COLOR INTERFERENCE OF URINE PIGMENT   PROTEINUR (A) 05/09/2018 1217    TEST NOT REPORTED DUE TO COLOR INTERFERENCE OF URINE PIGMENT   UROBILINOGEN 0.2 09/05/2012 0839   NITRITE (A) 05/09/2018 1217    TEST NOT REPORTED DUE TO COLOR INTERFERENCE OF URINE PIGMENT   LEUKOCYTESUR (A) 05/09/2018 1217    TEST NOT REPORTED DUE TO COLOR INTERFERENCE OF URINE PIGMENT    Radiological Exams on Admission: DG Chest Port 1 View  Result Date: 12/19/2022 CLINICAL DATA:  CHF SOB EXAM: PORTABLE CHEST 1 VIEW COMPARISON:  CT Chest 11/19/22 FINDINGS: No large pleural effusion. No pneumothorax. Cardiomegaly. No focal airspace opacity. No radiographically apparent displaced rib fractures. Visualized upper abdomen is unremarkable. Prominent bilateral interstitial opacities could represent mild pulmonary venous congestion. IMPRESSION: Cardiomegaly with possible mild pulmonary venous congestion. Electronically Signed   By: Lorenza Cambridge M.D.   On: 12/19/2022 13:17    EKG: Independently reviewed.  Atrial fibrillation at 84 bpm.  Nonspecific T wave changes.  Low voltage multiple leads.  Assessment/Plan Principal Problem:   Symptomatic anemia Active Problems:   Obesity- BMI 35   Anxiety   History of CVA (cerebrovascular accident)   Cocaine abuse (HCC)   History of TIA (transient ischemic attack)   Essential hypertension   Persistent atrial fibrillation (HCC)   Rheumatoid arthritis (HCC)   COPD with acute exacerbation (HCC)   Symptomatic microcytic anemia > Patient presenting after labs at cardiologist office showed hemoglobin of 7.3 down from 8.3.  Does report some mild shortness  of breath and lightheadedness. > No black or bloody stools reported.  Has had positive FOBT back in April, not yet repeated.  Is on Xarelto for A-fib.  Hemoglobin stable at 7.3 today as well. > Does have microcytic anemia unclear if he has had a gradual decline secondary to iron deficiency as he is not currently on iron versus some GI bleeding on Xarelto. > Patient has also been on methotrexate and Plaquenil. And, MTX held recently per patient due to anemia. - Monitor on telemetry overnight - Reduce to 1 unit for transfusion, given possible mild congestion on chest x-ray in the setting of CHF and goal hemoglobin of 8 - Plan for 20 mg IV Lasix following transfusion as below - Check FOBT, GI consult if positive - Check iron studies - Trend CBC  Chronic diastolic CHF > Patient with known history of chronic diastolic CHF.  Last echo showed EF 50-55%, indeterminate diastolic function, normal RV function. > Chest x-ray today showing cardiomegaly and some mild pulmonary vascular congestion. > As patient will be getting transfusion above patient will receive Lasix following transfusion to prevent TACO. - On telemetry as above - Check BNP - Continue with home Lasix tomorrow morning - Plan for 20 mg IV Lasix following completion of 1 unit transfusion today - Strict I's and O's, daily weights  HTN Atrial fibrillation > Persistent atrial fibrillation despite ablation. > Follows with cardiology and EP.  Plan for repeat ablation followed by Watchman procedure this year. - Continue home amiodarone - Holding Xarelto pending FOBT - Continue home  diltiazem  History of TIA/CVA - Holding Xarelto as above  Rheumatoid arthritis - On golimumab - Continue home Plaquenil - MTX held recently per patient due to anemia  COPD - Replace home Symbicort formulary Dulera - As needed albuterol  DVT prophylaxis: Plan to resume Xarelto if FOBT negative, if positive SCDs Code Status:   Full Family  Communication:  Updated at bedside  Disposition Plan:   Patient is from:  Home  Anticipated DC to:  Home  Anticipated DC date:  1 to 2 days  Anticipated DC barriers: None  Consults called:  None Admission status:  Observation, telemetry  Severity of Illness: The appropriate patient status for this patient is OBSERVATION. Observation status is judged to be reasonable and necessary in order to provide the required intensity of service to ensure the patient's safety. The patient's presenting symptoms, physical exam findings, and initial radiographic and laboratory data in the context of their medical condition is felt to place them at decreased risk for further clinical deterioration. Furthermore, it is anticipated that the patient will be medically stable for discharge from the hospital within 2 midnights of admission.   Synetta Fail MD [facility]  How to contact the [facility] Attending or Consulting provider 7A - 7P or covering provider during after hours 7P -7A, for this patient?   Check the care team in [facility] and look for a) attending/consulting [facility] provider listed and b) the [facility] team listed Log into [email or web address] and use [place]'s universal password to access. If you do not have the password, please contact the hospital operator. Locate the [facility] provider you are looking for under [facility] and page to a number that you can be directly reached. If you still have difficulty reaching the provider, please page the [facility] (Director on Call) for the Hospitalists listed on amion for assistance.  12/19/2022, 1:40 PM

## 2022-12-20 DIAGNOSIS — D509 Iron deficiency anemia, unspecified: Secondary | ICD-10-CM | POA: Diagnosis not present

## 2022-12-20 DIAGNOSIS — D649 Anemia, unspecified: Secondary | ICD-10-CM | POA: Diagnosis not present

## 2022-12-20 LAB — TYPE AND SCREEN
ABO/RH(D): A POS
Antibody Screen: NEGATIVE
Unit division: 0
Unit division: 0

## 2022-12-20 LAB — BASIC METABOLIC PANEL
Anion gap: 8 (ref 5–15)
BUN: 13 mg/dL (ref 8–23)
CO2: 29 mmol/L (ref 22–32)
Calcium: 8.4 mg/dL — ABNORMAL LOW (ref 8.9–10.3)
Chloride: 100 mmol/L (ref 98–111)
Creatinine, Ser: 0.71 mg/dL (ref 0.61–1.24)
GFR, Estimated: >60 mL/min (ref >60)
Glucose, Bld: 104 mg/dL — ABNORMAL HIGH (ref 70–99)
Potassium: 4.3 mmol/L (ref 3.5–5.1)
Sodium: 137 mmol/L (ref 135–145)

## 2022-12-20 LAB — BPAM RBC
Blood Product Expiration Date: 202407122359
Blood Product Expiration Date: 202407142359
ISSUE DATE / TIME: 202406211505
ISSUE DATE / TIME: 202406221405
Unit Type and Rh: 6200
Unit Type and Rh: 6200

## 2022-12-20 LAB — CBC
HCT: 29.9 % — ABNORMAL LOW (ref 39.0–52.0)
Hemoglobin: 7.9 g/dL — ABNORMAL LOW (ref 13.0–17.0)
MCH: 20.4 pg — ABNORMAL LOW (ref 26.0–34.0)
MCHC: 26.4 g/dL — ABNORMAL LOW (ref 30.0–36.0)
MCV: 77.1 fL — ABNORMAL LOW (ref 80.0–100.0)
Platelets: 253 10*3/uL (ref 150–400)
RBC: 3.88 MIL/uL — ABNORMAL LOW (ref 4.22–5.81)
RDW: 18.6 % — ABNORMAL HIGH (ref 11.5–15.5)
WBC: 8.2 10*3/uL (ref 4.0–10.5)
nRBC: 0.5 % — ABNORMAL HIGH (ref 0.0–0.2)

## 2022-12-20 LAB — GLUCOSE, CAPILLARY: Glucose-Capillary: 120 mg/dL — ABNORMAL HIGH (ref 70–99)

## 2022-12-20 MED ORDER — HYDROXYCHLOROQUINE SULFATE 200 MG PO TABS: 200.0000 mg | ORAL_TABLET | Freq: Two times a day (BID) | ORAL | Status: DC

## 2022-12-20 MED ORDER — HYDROXYCHLOROQUINE SULFATE 200 MG PO TABS: 200.0000 mg | ORAL_TABLET | Freq: Two times a day (BID) | ORAL | 0 refills | Status: DC

## 2022-12-20 MED ORDER — SLOW IRON 160 (50 FE) MG PO TBCR: 160.0000 mg | EXTENDED_RELEASE_TABLET | Freq: Every day | ORAL | 0 refills | Status: DC

## 2022-12-20 MED ORDER — RIVAROXABAN 20 MG PO TABS
20.0000 mg | ORAL_TABLET | Freq: Every day | ORAL | 0 refills | Status: DC
Start: 2022-12-23 — End: 2023-02-24

## 2022-12-20 MED ORDER — POLYETHYLENE GLYCOL 3350 17 G PO PACK: 17.0000 g | PACK | Freq: Every day | ORAL | 0 refills | Status: DC

## 2022-12-20 NOTE — Plan of Care (Signed)

## 2022-12-20 NOTE — Plan of Care (Signed)

## 2022-12-20 NOTE — Discharge Summary (Addendum)
Physician Discharge Summary  Brian [name] MVH:[id number] DOB: 20-Sep-1950 DOA: 12/19/2022  PCP: Joycelyn Rua, MD  Admit date: 12/19/2022 Discharge date: 12/20/2022  Admitted From: Home Disposition: Home  Recommendations for Outpatient Follow-up:  Follow up with PCP in 1-2 weeks Follow-up with cardiology as discussed, resume Xarelto 6/26 unless otherwise specified  Home Health: None Equipment/Devices: None  Discharge Condition: Stable CODE STATUS: Full Diet recommendation: Cardiac low-fat low-salt diet  Brief/Interim Summary: Brian [name] is a 72 y.o. male with medical history significant of TIA, CVA, A-fib, COPD, CHF, RA, obesity, gout, cocaine use, anxiety, anemia presenting with abnormal labs from cardiologist office.   Patient admitted with symptomatic microcytic anemia, acute on chronic with unclear etiology.  Previous FOBT positive, denies dark stools or bloody stools.  Hemoglobin baseline over the past 6 months appears to be around 8-9 although prior to 6 months ago patient's hemoglobin was essentially normal.  Patient continues on vitamin supplementation, increased p.o. iron intake having previously required IV transfusions and tablets of iron.  His iron during this hospitalization is actually within normal limits.  We discussed other possible etiology for ongoing anemia include bleeding/blood loss versus decreased production, related to bone or renal insufficiency.  Regardless at this point patient is stable asymptomatic hemoglobin is uptrending on its own currently 7.9 and is otherwise agreeable for discharge.  Continue slow iron, MiraLAX given reported symptoms of constipation with iron supplementation in the past. Recommend close follow-up with repeat labs in the outpatient setting, patient reports he needs a hemoglobin of 8 to undergo his ablation scheduled in 2 weeks.  Discussed need for follow-up with cardiology closely for repeat labs as well as to discuss ongoing Xarelto  use  Discharge Diagnoses:  Principal Problem:   Symptomatic anemia Active Problems:   Obesity- BMI 35   Anxiety   History of CVA (cerebrovascular accident)   Cocaine abuse (HCC)   History of TIA (transient ischemic attack)   Essential hypertension   Persistent atrial fibrillation (HCC)   Rheumatoid arthritis (HCC)   COPD with acute exacerbation (HCC)    Discharge Instructions   Allergies as of 12/20/2022       Reactions   Aquacel-ag Surgical Hydrofiber [wound Dressings] Other (See Comments)   Tears skin   Tape Rash, Other (See Comments)   Paper tape is preferred, please!! Adhesive tape tears skin        Medication List     STOP taking these medications    predniSONE 10 MG tablet Commonly known as: DELTASONE       TAKE these medications    acetaminophen 500 MG tablet Commonly known as: TYLENOL Take 500 mg by mouth as needed (For Headache).   amiodarone 200 MG tablet Commonly known as: PACERONE Take 1 tablet (200 mg total) by mouth daily. Please keep upcoming appt.with Cardiologist for future refills. Thank You.   budesonide-formoterol 160-4.5 MCG/ACT inhaler Commonly known as: Symbicort Inhale 2 puffs into the lungs 2 (two) times daily.   diltiazem 120 MG 24 hr capsule Commonly known as: CARDIZEM CD Take 1 capsule (120 mg total) by mouth daily.   folic acid 1 MG tablet Commonly known as: FOLVITE Take 1 mg by mouth daily.   furosemide 40 MG tablet Commonly known as: LASIX Take 1 tablet (40 mg total) by mouth daily.   hydroxychloroquine 200 MG tablet Commonly known as: PLAQUENIL Take 1 tablet (200 mg total) by mouth 2 (two) times daily. What changed: when to take this   Klor-Con M20  20 MEQ tablet Generic drug: potassium chloride SA TAKE 1 TABLET BY MOUTH EVERY DAY What changed: how much to take   levalbuterol 45 MCG/ACT inhaler Commonly known as: XOPENEX HFA Inhale 1-2 puffs into the lungs every 4 (four) hours as needed for wheezing or  shortness of breath.   methotrexate 2.5 MG tablet Commonly known as: RHEUMATREX Take 25 mg by mouth once a week. 10 tablets 1 time a week on Fridays in the evening   pantoprazole 40 MG tablet Commonly known as: Protonix Take 1 tablet (40 mg total) by mouth daily.   polyethylene glycol 17 g packet Commonly known as: MIRALAX / GLYCOLAX Take 17 g by mouth daily. Continue daily while on iron.   rivaroxaban 20 MG Tabs tablet Commonly known as: Xarelto Take 1 tablet (20 mg total) by mouth daily with supper. Start taking on: December 23, 2022 What changed: These instructions start on December 23, 2022. If you are unsure what to do until then, ask your doctor or other care provider.   Simponi Aria 50 MG/4ML Soln injection Generic drug: golimumab Inject 50 mg into the vein every 8 (eight) weeks.   Slow Iron 160 (50 Fe) MG Tbcr SR tablet Generic drug: ferrous sulfate Take 1 tablet (160 mg total) by mouth daily.   tamsulosin 0.4 MG Caps capsule Commonly known as: FLOMAX Take 0.4 mg by mouth at bedtime.        Allergies  Allergen Reactions   Aquacel-Ag Surgical Hydrofiber [Wound Dressings] Other (See Comments)    Tears skin   Tape Rash and Other (See Comments)    Paper tape is preferred, please!! Adhesive tape tears skin    Consultations: None  Procedures/Studies: DG Chest Port 1 View  Result Date: 12/19/2022 CLINICAL DATA:  CHF SOB EXAM: PORTABLE CHEST 1 VIEW COMPARISON:  CT Chest 11/19/22 FINDINGS: No large pleural effusion. No pneumothorax. Cardiomegaly. No focal airspace opacity. No radiographically apparent displaced rib fractures. Visualized upper abdomen is unremarkable. Prominent bilateral interstitial opacities could represent mild pulmonary venous congestion. IMPRESSION: Cardiomegaly with possible mild pulmonary venous congestion. Electronically Signed   By: Lorenza Cambridge M.D.   On: 12/19/2022 13:17     Subjective: No acute issues or events overnight   Discharge  Exam: Vitals:   12/20/22 0808 12/20/22 0821  BP:  (!) 121/57  Pulse:    Resp:  18  Temp:  97.9 F (36.6 C)  SpO2: 95%    Vitals:   12/20/22 0109 12/20/22 0334 12/20/22 0808 12/20/22 0821  BP: 132/72 123/67  (!) 121/57  Pulse: 86 88    Resp: 18 18  18   Temp: 98.5 F (36.9 C) 98.6 F (37 C)  97.9 F (36.6 C)  TempSrc: Oral Oral  Oral  SpO2: 94% 93% 95%   Weight:  116.7 kg    Height:        General: Pt is alert, awake, not in acute distress Cardiovascular: RRR, S1/S2 +, no rubs, no gallops Respiratory: CTA bilaterally, no wheezing, no rhonchi Abdominal: Soft, NT, ND, bowel sounds + Extremities: no edema, no cyanosis    The results of significant diagnostics from this hospitalization (including imaging, microbiology, ancillary and laboratory) are listed below for reference.     Microbiology: No results found for this or any previous visit (from the past 240 hour(s)).   Labs: BNP (last 3 results) Recent Labs    10/15/22 1647 10/16/22 1324 12/19/22 1159  BNP 321.4* 558.8* 336.5*   Basic Metabolic Panel: Recent Labs  Lab 12/18/22 1619 12/19/22 1159 12/20/22 0339  NA 139 137 137  K 4.7 5.1 4.3  CL 96 98 100  CO2 28 28 29   GLUCOSE 128* 117* 104*  BUN 17 17 13   CREATININE 0.74* 0.79 0.71  CALCIUM 8.7 8.7* 8.4*  MG  --  1.8  --    Liver Function Tests: No results for input(s): "AST", "ALT", "ALKPHOS", "BILITOT", "PROT", "ALBUMIN" in the last 168 hours. No results for input(s): "LIPASE", "AMYLASE" in the last 168 hours. No results for input(s): "AMMONIA" in the last 168 hours. CBC: Recent Labs  Lab 12/18/22 1619 12/19/22 1159 12/19/22 1751 12/20/22 0339  WBC 10.1 8.6  --  8.2  NEUTROABS  --  6.0  --   --   HGB 7.3* 7.3* 7.7* 7.9*  HCT 27.1* 28.3* 29.1* 29.9*  MCV 75* 76.7*  --  77.1*  PLT 289 288  --  253   Cardiac Enzymes: No results for input(s): "CKTOTAL", "CKMB", "CKMBINDEX", "TROPONINI" in the last 168 hours. BNP: Invalid input(s):  "POCBNP" CBG: Recent Labs  Lab 12/20/22 0337  GLUCAP 120*   D-Dimer No results for input(s): "DDIMER" in the last 72 hours. Hgb A1c No results for input(s): "HGBA1C" in the last 72 hours. Lipid Profile No results for input(s): "CHOL", "HDL", "LDLCALC", "TRIG", "CHOLHDL", "LDLDIRECT" in the last 72 hours. Thyroid function studies No results for input(s): "TSH", "T4TOTAL", "T3FREE", "THYROIDAB" in the last 72 hours.  Invalid input(s): "FREET3" Anemia work up Recent Labs    12/19/22 1751  FERRITIN 9*  TIBC 501*  IRON 103   Urinalysis    Component Value Date/Time   COLORURINE RED (A) 05/09/2018 1217   APPEARANCEUR CLOUDY (A) 05/09/2018 1217   LABSPEC 1.025 05/09/2018 1217   PHURINE 6.5 05/09/2018 1217   GLUCOSEU (A) 05/09/2018 1217    TEST NOT REPORTED DUE TO COLOR INTERFERENCE OF URINE PIGMENT   HGBUR (A) 05/09/2018 1217    TEST NOT REPORTED DUE TO COLOR INTERFERENCE OF URINE PIGMENT   BILIRUBINUR (A) 05/09/2018 1217    TEST NOT REPORTED DUE TO COLOR INTERFERENCE OF URINE PIGMENT   KETONESUR (A) 05/09/2018 1217    TEST NOT REPORTED DUE TO COLOR INTERFERENCE OF URINE PIGMENT   PROTEINUR (A) 05/09/2018 1217    TEST NOT REPORTED DUE TO COLOR INTERFERENCE OF URINE PIGMENT   UROBILINOGEN 0.2 09/05/2012 0839   NITRITE (A) 05/09/2018 1217    TEST NOT REPORTED DUE TO COLOR INTERFERENCE OF URINE PIGMENT   LEUKOCYTESUR (A) 05/09/2018 1217    TEST NOT REPORTED DUE TO COLOR INTERFERENCE OF URINE PIGMENT   Sepsis Labs Recent Labs  Lab 12/18/22 1619 12/19/22 1159 12/20/22 0339  WBC 10.1 8.6 8.2   Microbiology No results found for this or any previous visit (from the past 240 hour(s)).   Time coordinating discharge: Over 30 minutes  SIGNED:   Azucena Fallen, DO [facility] 12/20/2022, 10:46 AM Pager   If 7PM-7AM, please contact night-coverage [email or web address]

## 2022-12-21 ENCOUNTER — Telehealth: Payer: Self-pay

## 2022-12-21 DIAGNOSIS — M0579 Rheumatoid arthritis with rheumatoid factor of multiple sites without organ or systems involvement: Secondary | ICD-10-CM | POA: Diagnosis not present

## 2022-12-21 DIAGNOSIS — Z79899 Other long term (current) drug therapy: Secondary | ICD-10-CM | POA: Diagnosis not present

## 2022-12-21 DIAGNOSIS — I4819 Other persistent atrial fibrillation: Secondary | ICD-10-CM

## 2022-12-21 DIAGNOSIS — R5383 Other fatigue: Secondary | ICD-10-CM | POA: Diagnosis not present

## 2022-12-21 NOTE — Telephone Encounter (Signed)
-----   Message from Maurice Small, MD sent at 12/21/2022  4:04 PM EDT ----- I think he should get a repeat CBC 3-5 days before his implant. ----- Message ----- From: Interface, [facility] Lab Results In Sent: 12/19/2022   6:37 AM EDT To: Maurice Small, MD

## 2022-12-21 NOTE — Telephone Encounter (Signed)
Spoke with patient, repeat CBC is scheduled for 01/04/23. No questions at this time   ----- Message from Maurice Small, MD sent at 12/21/2022  4:04 PM EDT ----- I think he should get a repeat CBC 3-5 days before his implant.

## 2022-12-22 ENCOUNTER — Ambulatory Visit: Payer: Medicare Other

## 2022-12-22 DIAGNOSIS — D090 Carcinoma in situ of bladder: Secondary | ICD-10-CM | POA: Diagnosis not present

## 2022-12-22 DIAGNOSIS — M0579 Rheumatoid arthritis with rheumatoid factor of multiple sites without organ or systems involvement: Secondary | ICD-10-CM | POA: Diagnosis not present

## 2022-12-22 DIAGNOSIS — E669 Obesity, unspecified: Secondary | ICD-10-CM | POA: Diagnosis not present

## 2022-12-22 DIAGNOSIS — Z6836 Body mass index (BMI) 36.0-36.9, adult: Secondary | ICD-10-CM | POA: Diagnosis not present

## 2022-12-22 DIAGNOSIS — M1991 Primary osteoarthritis, unspecified site: Secondary | ICD-10-CM | POA: Diagnosis not present

## 2022-12-22 DIAGNOSIS — R5382 Chronic fatigue, unspecified: Secondary | ICD-10-CM | POA: Diagnosis not present

## 2022-12-22 DIAGNOSIS — D508 Other iron deficiency anemias: Secondary | ICD-10-CM | POA: Diagnosis not present

## 2022-12-23 DIAGNOSIS — I482 Chronic atrial fibrillation, unspecified: Secondary | ICD-10-CM | POA: Diagnosis not present

## 2022-12-23 DIAGNOSIS — R262 Difficulty in walking, not elsewhere classified: Secondary | ICD-10-CM | POA: Diagnosis not present

## 2022-12-23 DIAGNOSIS — D649 Anemia, unspecified: Secondary | ICD-10-CM | POA: Diagnosis not present

## 2022-12-23 DIAGNOSIS — D6869 Other thrombophilia: Secondary | ICD-10-CM | POA: Diagnosis not present

## 2022-12-23 DIAGNOSIS — M0579 Rheumatoid arthritis with rheumatoid factor of multiple sites without organ or systems involvement: Secondary | ICD-10-CM | POA: Diagnosis not present

## 2022-12-23 DIAGNOSIS — J441 Chronic obstructive pulmonary disease with (acute) exacerbation: Secondary | ICD-10-CM | POA: Diagnosis not present

## 2022-12-23 DIAGNOSIS — I5032 Chronic diastolic (congestive) heart failure: Secondary | ICD-10-CM | POA: Diagnosis not present

## 2022-12-23 DIAGNOSIS — F17200 Nicotine dependence, unspecified, uncomplicated: Secondary | ICD-10-CM | POA: Diagnosis not present

## 2022-12-23 DIAGNOSIS — Z0000 Encounter for general adult medical examination without abnormal findings: Secondary | ICD-10-CM | POA: Diagnosis not present

## 2022-12-28 ENCOUNTER — Other Ambulatory Visit: Payer: Self-pay | Admitting: Cardiology

## 2022-12-30 DIAGNOSIS — L258 Unspecified contact dermatitis due to other agents: Secondary | ICD-10-CM | POA: Diagnosis not present

## 2023-01-01 ENCOUNTER — Encounter: Payer: Self-pay | Admitting: Pulmonary Disease

## 2023-01-01 ENCOUNTER — Ambulatory Visit (INDEPENDENT_AMBULATORY_CARE_PROVIDER_SITE_OTHER): Payer: Medicare Other | Admitting: Pulmonary Disease

## 2023-01-01 VITALS — BP 110/72 | HR 91 | Ht 70.0 in | Wt 266.0 lb

## 2023-01-01 DIAGNOSIS — I4819 Other persistent atrial fibrillation: Secondary | ICD-10-CM

## 2023-01-01 DIAGNOSIS — F17200 Nicotine dependence, unspecified, uncomplicated: Secondary | ICD-10-CM | POA: Diagnosis not present

## 2023-01-01 DIAGNOSIS — I5032 Chronic diastolic (congestive) heart failure: Secondary | ICD-10-CM | POA: Diagnosis not present

## 2023-01-01 DIAGNOSIS — J441 Chronic obstructive pulmonary disease with (acute) exacerbation: Secondary | ICD-10-CM | POA: Diagnosis not present

## 2023-01-01 DIAGNOSIS — J432 Centrilobular emphysema: Secondary | ICD-10-CM

## 2023-01-01 LAB — PULMONARY FUNCTION TEST
DL/VA % pred: 97 %
DL/VA: 3.94 ml/min/mmHg/L
DLCO cor % pred: 49 %
DLCO cor: 12.82 ml/min/mmHg
DLCO unc % pred: 49 %
DLCO unc: 12.82 ml/min/mmHg
FEF 25-75 Post: 0.49 L/sec
FEF 25-75 Pre: 0.4 L/sec
FEF2575-%Change-Post: 21 %
FEF2575-%Pred-Post: 20 %
FEF2575-%Pred-Pre: 16 %
FEV1-%Change-Post: 0 %
FEV1-%Pred-Post: 30 %
FEV1-%Pred-Pre: 30 %
FEV1-Post: 0.98 L
FEV1-Pre: 0.98 L
FEV1FVC-%Change-Post: 0 %
FEV1FVC-%Pred-Pre: 88 %
FEV6-%Change-Post: 0 %
FEV6-%Pred-Post: 35 %
FEV6-%Pred-Pre: 35 %
FEV6-Post: 1.48 L
FEV6-Pre: 1.47 L
FEV6FVC-%Change-Post: 0 %
FEV6FVC-%Pred-Post: 104 %
FEV6FVC-%Pred-Pre: 103 %
FVC-%Change-Post: 0 %
FVC-%Pred-Post: 34 %
FVC-%Pred-Pre: 34 %
FVC-Post: 1.51 L
FVC-Pre: 1.51 L
Post FEV1/FVC ratio: 65 %
Post FEV6/FVC ratio: 98 %
Pre FEV1/FVC ratio: 65 %
Pre FEV6/FVC Ratio: 98 %
RV % pred: 126 %
RV: 3.12 L
TLC % pred: 66 %
TLC: 4.71 L

## 2023-01-01 NOTE — Patient Instructions (Signed)
I will see you back in about 6 months  Continue using your Symbicort I will give you a sample of Breztri to see if this helps you better than the Symbicort Do not use the Breztri and the Symbicort at the same time  Graded activities as tolerated  Your breathing study does show that you have advanced COPD  Call us with significant concerns

## 2023-01-01 NOTE — Progress Notes (Unsigned)
Brian [name]    [id number]    01-15-1951  Primary Care Physician:Meyers, Jeannett Senior, MD  Referring Physician: Joycelyn Rua, MD [address] [place],  [place] [address]  Chief complaint:   Patient being seen for obstructive lung disease  HPI:  History of COPD Recent hospitalization for COPD exacerbation  Active smoker about half a pack a day  45-pack-year smoking history  On Symbicort and Xopenex Does have a history of atrial fibrillation/flutter History of heart failure  History of arthritis, rheumatoid arthritis  No pertinent occupational history  History of TIA, chronic diastolic heart failure  Some degree of deconditioning  History of bladder cancer  Does use Symbicort twice a day Short of breath with mild to moderate exertion  Outpatient Encounter Medications as of 01/01/2023  Medication Sig   acetaminophen (TYLENOL) 500 MG tablet Take 500 mg by mouth as needed (For Headache).   amiodarone (PACERONE) 200 MG tablet Take 1 tablet (200 mg total) by mouth daily. Please keep upcoming appt.with Cardiologist for future refills. Thank You.   budesonide-formoterol (SYMBICORT) 160-4.5 MCG/ACT inhaler Inhale 2 puffs into the lungs 2 (two) times daily.   diltiazem (CARDIZEM CD) 120 MG 24 hr capsule TAKE 1 CAPSULE BY MOUTH EVERY DAY   ferrous sulfate (SLOW IRON) 160 (50 Fe) MG TBCR SR tablet Take 1 tablet (160 mg total) by mouth daily.   folic acid (FOLVITE) 1 MG tablet Take 1 mg by mouth daily.   furosemide (LASIX) 40 MG tablet Take 1 tablet (40 mg total) by mouth daily.   golimumab (SIMPONI ARIA) 50 MG/4ML SOLN injection Inject 50 mg into the vein every 8 (eight) weeks.   hydroxychloroquine (PLAQUENIL) 200 MG tablet Take 1 tablet (200 mg total) by mouth 2 (two) times daily.   KLOR-CON M20 20 MEQ tablet TAKE 1 TABLET BY MOUTH EVERY DAY (Patient taking differently: Take 20 mEq by mouth daily.)   levalbuterol (XOPENEX HFA) 45 MCG/ACT inhaler Inhale 1-2  puffs into the lungs every 4 (four) hours as needed for wheezing or shortness of breath.   methotrexate (RHEUMATREX) 2.5 MG tablet Take 25 mg by mouth once a week. 10 tablets 1 time a week on Fridays in the evening   pantoprazole (PROTONIX) 40 MG tablet Take 1 tablet (40 mg total) by mouth daily.   polyethylene glycol (MIRALAX / GLYCOLAX) 17 g packet Take 17 g by mouth daily. Continue daily while on iron.   rivaroxaban (XARELTO) 20 MG TABS tablet Take 1 tablet (20 mg total) by mouth daily with supper.   tamsulosin (FLOMAX) 0.4 MG CAPS capsule Take 0.4 mg by mouth at bedtime.   No facility-administered encounter medications on file as of 01/01/2023.    Allergies as of 01/01/2023 - Review Complete 01/01/2023  Allergen Reaction Noted   Aquacel-ag surgical hydrofiber [wound dressings] Other (See Comments) 04/29/2022   Tape Rash and Other (See Comments) 09/30/2016    Past Medical History:  Diagnosis Date   Anemia    none since 2019   Anxiety    Atrial flutter (HCC)    a. 08/2012   CAP (community acquired pneumonia) 09/30/2016   Cellulitis of left leg 11/29/2016   CHF (congestive heart failure) (HCC)    chronic diastolic chf, pt denies   Community acquired pneumonia of left lower lobe of lung    GERD (gastroesophageal reflux disease)    Gout    pt denies   History of kidney stones  passed   Hypertension    Obesity    Paroxysmal atrial fibrillation (HCC)    Pneumonia 09/2016   Positive urine drug screen    Rheumatoid arthritis (HCC)    "a little bit qwhere" (07/22/2017)   Sepsis (HCC) 11/29/2016   Shortness of breath    with heavy  exertion   Tachypnea    Tobacco abuse     Past Surgical History:  Procedure Laterality Date   A-FLUTTER ABLATION N/A 12/29/2016   Procedure: A-Flutter Ablation;  Surgeon: Hillis Range, MD;  Location: [facility]IVE [facility];  Service: Cardiovascular;  Laterality: N/A;   ATRIAL FIBRILLATION ABLATION N/A 07/20/2022   Procedure: ATRIAL FIBRILLATION  ABLATION;  Surgeon: Maurice Small, MD;  Location: [facility];  Service: Cardiovascular;  Laterality: N/A;   CARDIOVERSION N/A 11/05/2016   Procedure: CARDIOVERSION;  Surgeon: Quintella Reichert, MD;  Location: [facility] ENDOSCOPY;  Service: Cardiovascular;  Laterality: N/A;   CARDIOVERSION N/A 06/09/2022   Procedure: CARDIOVERSION;  Surgeon: Jake Bathe, MD;  Location: [facility] ENDOSCOPY;  Service: Cardiovascular;  Laterality: N/A;   CARDIOVERSION N/A 09/14/2022   Procedure: CARDIOVERSION;  Surgeon: Christell Constant, MD;  Location: [facility] ENDOSCOPY;  Service: Cardiovascular;  Laterality: N/A;   IR THORACENTESIS ASP PLEURAL SPACE W/IMG GUIDE  10/01/2016   JOINT REPLACEMENT     Right hip and left knee   MICROLARYNGOSCOPY  09/02/2007   with excision of right vocal cord mass, Dr. Pollyann Kennedy   PROSTATE BIOPSY N/A 02/22/2020   Procedure: BIOPSY TRANSRECTAL ULTRASONIC PROSTATE (TUBP);  Surgeon: Bjorn Pippin, MD;  Location: [facility];  Service: Urology;  Laterality: N/A;   TOTAL HIP ARTHROPLASTY Right 11/17/2017   Procedure: RIGHT TOTAL HIP ARTHROPLASTY ANTERIOR APPROACH;  Surgeon: Tarry Kos, MD;  Location: [facility] OR;  Service: Orthopedics;  Laterality: Right;   TOTAL HIP ARTHROPLASTY Left 03/07/2018   Procedure: LEFT TOTAL HIP ARTHROPLASTY ANTERIOR APPROACH;  Surgeon: Tarry Kos, MD;  Location: [facility] OR;  Service: Orthopedics;  Laterality: Left;   TOTAL KNEE ARTHROPLASTY Left 07/22/2017   Procedure: LEFT TOTAL KNEE ARTHROPLASTY;  Surgeon: Tarry Kos, MD;  Location: [facility] OR;  Service: Orthopedics;  Laterality: Left;   TRANSURETHRAL RESECTION OF BLADDER TUMOR N/A 04/02/2020   Procedure: RESTAGING TRANSURETHRAL RESECTION OF BLADDER TUMOR (TURBT);  Surgeon: Bjorn Pippin, MD;  Location: [facility];  Service: Urology;  Laterality: N/A;  GENERAL ANESTHESIA WIHT PARALYSIS   TRANSURETHRAL RESECTION OF BLADDER TUMOR WITH MITOMYCIN-C N/A 02/22/2020   Procedure: TRANSURETHRAL RESECTION OF  BLADDER TUMOR WITH   GEMCITABINE;  Surgeon: Bjorn Pippin, MD;  Location: [facility];  Service: Urology;  Laterality: N/A;    Family History  Problem Relation Age of Onset   Cancer Mother    Heart disease Father    Heart attack Father    Cancer Father    Diabetes Sister     Social History   Socioeconomic History   Marital status: Divorced    Spouse name: Not on file   Number of children: 3   Years of education: Not on file   Highest education level: Not on file  Occupational History   Not on file  Tobacco Use   Smoking status: Every Day    Packs/day: 0.50    Years: 45.00    Additional pack years: 0.00    Total pack years: 22.50    Types: Cigarettes   Smokeless tobacco: Never   Tobacco comments:    1-2 cigarettes  a week   Vaping Use   Vaping Use: Never used  Substance and Sexual Activity   Alcohol use: Yes    Comment: 07/22/2017 "nothing since 2016"   Drug use: No    Comment: 07/22/2017 "none since 11/2016" cocaine (sister is not aware)   Sexual activity: Not Currently  Other Topics Concern   Not on file  Social History Narrative   Lives in [place]    Works at a convenience store   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   Divorced with 3 children and 9 grandchildren, he states ex-wife is still a friend   Social Determinants of Corporate investment banker Strain: Not on file  Food Insecurity: No Food Insecurity (12/19/2022)   Hunger Vital Sign    Worried About Running Out of Food in the Last Year: Never true    Ran Out of Food in the Last Year: Never true  Transportation Needs: No Transportation Needs (12/19/2022)   PRAPARE - Administrator, Civil Service (Medical): No    Lack of Transportation (Non-Medical): No  Physical Activity: Not on file  Stress: Not on file  Social Connections: Not on file  Intimate Partner Violence: Not At Risk (12/19/2022)   Humiliation, Afraid, Rape, and Kick questionnaire    Fear of Current or Ex-Partner: No     Emotionally Abused: No    Physically Abused: No    Sexually Abused: No    Review of Systems  Respiratory:  Positive for shortness of breath.     Vitals:   01/01/23 1005  BP: 110/72  Pulse: 91  SpO2: 93%     Physical Exam Constitutional:      Appearance: He is obese.  HENT:     Head: Normocephalic.     Mouth/Throat:     Mouth: Mucous membranes are moist.  Eyes:     General: No scleral icterus. Cardiovascular:     Rate and Rhythm: Normal rate and regular rhythm.     Heart sounds: No murmur heard.    No friction rub.  Pulmonary:     Effort: No respiratory distress.     Breath sounds: No stridor. No wheezing or rhonchi.     Comments: Poor air movement bilaterally Musculoskeletal:     Cervical back: No rigidity or tenderness.  Neurological:     Mental Status: He is alert.  Psychiatric:        Mood and Affect: Mood normal.      Data Reviewed: Recent ABG-VBG reviewed showing 7.34/55/47  Assessment:  Chronic obstructive pulmonary disease -Stages unknown -Will need to get about pulmonary function test -Recent hospitalization blood gas did reveal some hypercarbia although this was from a venous blood gas  History of heart failure Diastolic heart failure -Optimize diuretics  Active smoker -Counseled about the need to quit smoking as this affects both his heart and his lungs  Atrial fibrillation -Continue current medications -On amiodarone  Rheumatoid arthritis -On methotrexate  Class II obesity  Plan/Recommendations: Smoking cessation counseling  Continue bronchodilator treatments -Currently on Symbicort  Continue diuretics  Graded activity as tolerated  He has to quit smoking  Obtain low-dose CT scan of the chest  Obtain pulmonary function test  Follow-up in about 3 months  Encouraged to call us with any significant concerns   Virl Diamond MD [facility] 01/01/2023, 10:18 AM  CC: Joycelyn Rua, MD

## 2023-01-01 NOTE — Progress Notes (Signed)
PFT done today. 

## 2023-01-04 ENCOUNTER — Ambulatory Visit (HOSPITAL_COMMUNITY)
Admission: RE | Admit: 2023-01-04 | Discharge: 2023-01-04 | Disposition: A | Discharge status: Home / Self Care | Payer: Medicare Other | Source: Ambulatory Visit | Attending: Cardiovascular Disease | Admitting: Cardiovascular Disease

## 2023-01-04 ENCOUNTER — Other Ambulatory Visit (HOSPITAL_COMMUNITY): Payer: Medicare Other

## 2023-01-04 ENCOUNTER — Ambulatory Visit: Payer: Medicare Other

## 2023-01-04 DIAGNOSIS — Z01810 Encounter for preprocedural cardiovascular examination: Secondary | ICD-10-CM | POA: Diagnosis not present

## 2023-01-04 DIAGNOSIS — I2510 Atherosclerotic heart disease of native coronary artery without angina pectoris: Secondary | ICD-10-CM | POA: Diagnosis not present

## 2023-01-04 DIAGNOSIS — I4892 Unspecified atrial flutter: Secondary | ICD-10-CM

## 2023-01-04 DIAGNOSIS — I4819 Other persistent atrial fibrillation: Secondary | ICD-10-CM | POA: Diagnosis not present

## 2023-01-04 DIAGNOSIS — I4891 Unspecified atrial fibrillation: Secondary | ICD-10-CM | POA: Insufficient documentation

## 2023-01-04 MED ORDER — IOHEXOL 350 MG/ML SOLN
100.0000 mL | Freq: Once | INTRAVENOUS | Status: AC | PRN
  Administered 2023-01-04: 100 mL via INTRAVENOUS

## 2023-01-04 MED ORDER — DILTIAZEM HCL 25 MG/5ML IV SOLN
5.0000 mg | INTRAVENOUS | Status: DC | PRN
  Administered 2023-01-04 (×2): 5 mg via INTRAVENOUS

## 2023-01-04 MED ORDER — DILTIAZEM HCL 25 MG/5ML IV SOLN
INTRAVENOUS | Status: AC
  Filled 2023-01-04: qty 5

## 2023-01-04 MED ORDER — DILTIAZEM HCL 25 MG/5ML IV SOLN
5.0000 mg | Freq: Once | INTRAVENOUS | Status: AC
  Administered 2023-01-04: 5 mg via INTRAVENOUS

## 2023-01-05 LAB — CBC WITH DIFFERENTIAL/PLATELET
Basophils Absolute: 0.1 10*3/uL (ref 0.0–0.2)
Basos: 1 %
EOS (ABSOLUTE): 0.1 10*3/uL (ref 0.0–0.4)
Eos: 1 %
Hematocrit: 29.3 % — ABNORMAL LOW (ref 37.5–51.0)
Hemoglobin: 7.5 g/dL — ABNORMAL LOW (ref 13.0–17.7)
Immature Grans (Abs): 0.2 10*3/uL — ABNORMAL HIGH (ref 0.0–0.1)
Immature Granulocytes: 2 %
Lymphocytes Absolute: 0.8 10*3/uL (ref 0.7–3.1)
Lymphs: 7 %
MCH: 19.8 pg — ABNORMAL LOW (ref 26.6–33.0)
MCHC: 25.6 g/dL — ABNORMAL LOW (ref 31.5–35.7)
MCV: 78 fL — ABNORMAL LOW (ref 79–97)
Monocytes Absolute: 1.3 10*3/uL — ABNORMAL HIGH (ref 0.1–0.9)
Monocytes: 11 %
NRBC: 1 % — ABNORMAL HIGH (ref 0–0)
Neutrophils Absolute: 9.3 10*3/uL — ABNORMAL HIGH (ref 1.4–7.0)
Neutrophils: 78 %
Platelets: 359 10*3/uL (ref 150–450)
RBC: 3.78 x10E6/uL — ABNORMAL LOW (ref 4.14–5.80)
RDW: 19.7 % — ABNORMAL HIGH (ref 11.6–15.4)
WBC: 11.8 10*3/uL — ABNORMAL HIGH (ref 3.4–10.8)

## 2023-01-06 ENCOUNTER — Encounter (HOSPITAL_COMMUNITY): Payer: Self-pay | Admitting: Anesthesiology

## 2023-01-06 ENCOUNTER — Other Ambulatory Visit: Payer: Self-pay | Admitting: Cardiology

## 2023-01-07 ENCOUNTER — Observation Stay (HOSPITAL_COMMUNITY): Payer: Medicare Other

## 2023-01-07 ENCOUNTER — Encounter (HOSPITAL_COMMUNITY): Payer: Self-pay | Admitting: Internal Medicine

## 2023-01-07 ENCOUNTER — Encounter (HOSPITAL_COMMUNITY)
Admission: RE | Disposition: A | Discharge status: Home / Self Care | Payer: Self-pay | Source: Home / Self Care | Attending: Internal Medicine

## 2023-01-07 ENCOUNTER — Other Ambulatory Visit: Payer: Self-pay

## 2023-01-07 ENCOUNTER — Inpatient Hospital Stay (HOSPITAL_COMMUNITY)
Admission: RE | Admit: 2023-01-07 | Discharge: 2023-01-10 | DRG: 291 | Disposition: A | Discharge status: Home / Self Care | Payer: Medicare Other | Attending: Internal Medicine | Admitting: Internal Medicine

## 2023-01-07 DIAGNOSIS — Z87891 Personal history of nicotine dependence: Secondary | ICD-10-CM

## 2023-01-07 DIAGNOSIS — I10 Essential (primary) hypertension: Secondary | ICD-10-CM | POA: Diagnosis present

## 2023-01-07 DIAGNOSIS — J441 Chronic obstructive pulmonary disease with (acute) exacerbation: Secondary | ICD-10-CM | POA: Diagnosis not present

## 2023-01-07 DIAGNOSIS — Z8249 Family history of ischemic heart disease and other diseases of the circulatory system: Secondary | ICD-10-CM

## 2023-01-07 DIAGNOSIS — G4700 Insomnia, unspecified: Secondary | ICD-10-CM | POA: Diagnosis present

## 2023-01-07 DIAGNOSIS — I272 Pulmonary hypertension, unspecified: Secondary | ICD-10-CM | POA: Diagnosis present

## 2023-01-07 DIAGNOSIS — J449 Chronic obstructive pulmonary disease, unspecified: Secondary | ICD-10-CM | POA: Diagnosis present

## 2023-01-07 DIAGNOSIS — Z8673 Personal history of transient ischemic attack (TIA), and cerebral infarction without residual deficits: Secondary | ICD-10-CM

## 2023-01-07 DIAGNOSIS — Z7901 Long term (current) use of anticoagulants: Secondary | ICD-10-CM

## 2023-01-07 DIAGNOSIS — K219 Gastro-esophageal reflux disease without esophagitis: Secondary | ICD-10-CM | POA: Diagnosis present

## 2023-01-07 DIAGNOSIS — Z7951 Long term (current) use of inhaled steroids: Secondary | ICD-10-CM

## 2023-01-07 DIAGNOSIS — I071 Rheumatic tricuspid insufficiency: Secondary | ICD-10-CM | POA: Diagnosis present

## 2023-01-07 DIAGNOSIS — Z539 Procedure and treatment not carried out, unspecified reason: Secondary | ICD-10-CM | POA: Diagnosis present

## 2023-01-07 DIAGNOSIS — Z833 Family history of diabetes mellitus: Secondary | ICD-10-CM

## 2023-01-07 DIAGNOSIS — I4819 Other persistent atrial fibrillation: Secondary | ICD-10-CM | POA: Diagnosis present

## 2023-01-07 DIAGNOSIS — E669 Obesity, unspecified: Secondary | ICD-10-CM | POA: Diagnosis present

## 2023-01-07 DIAGNOSIS — I2510 Atherosclerotic heart disease of native coronary artery without angina pectoris: Secondary | ICD-10-CM | POA: Diagnosis present

## 2023-01-07 DIAGNOSIS — D649 Anemia, unspecified: Secondary | ICD-10-CM

## 2023-01-07 DIAGNOSIS — J9601 Acute respiratory failure with hypoxia: Secondary | ICD-10-CM | POA: Diagnosis present

## 2023-01-07 DIAGNOSIS — D509 Iron deficiency anemia, unspecified: Secondary | ICD-10-CM | POA: Diagnosis present

## 2023-01-07 DIAGNOSIS — Z96643 Presence of artificial hip joint, bilateral: Secondary | ICD-10-CM | POA: Diagnosis present

## 2023-01-07 DIAGNOSIS — E66812 Obesity, class 2: Secondary | ICD-10-CM | POA: Diagnosis present

## 2023-01-07 DIAGNOSIS — Z6839 Body mass index (BMI) 39.0-39.9, adult: Secondary | ICD-10-CM

## 2023-01-07 DIAGNOSIS — F1490 Cocaine use, unspecified, uncomplicated: Secondary | ICD-10-CM | POA: Diagnosis present

## 2023-01-07 DIAGNOSIS — Z96652 Presence of left artificial knee joint: Secondary | ICD-10-CM | POA: Diagnosis present

## 2023-01-07 DIAGNOSIS — R918 Other nonspecific abnormal finding of lung field: Secondary | ICD-10-CM | POA: Diagnosis not present

## 2023-01-07 DIAGNOSIS — M069 Rheumatoid arthritis, unspecified: Secondary | ICD-10-CM | POA: Diagnosis present

## 2023-01-07 DIAGNOSIS — I517 Cardiomegaly: Secondary | ICD-10-CM | POA: Diagnosis not present

## 2023-01-07 DIAGNOSIS — I700 Atherosclerosis of aorta: Secondary | ICD-10-CM | POA: Diagnosis not present

## 2023-01-07 DIAGNOSIS — Z79899 Other long term (current) drug therapy: Secondary | ICD-10-CM

## 2023-01-07 DIAGNOSIS — I5033 Acute on chronic diastolic (congestive) heart failure: Secondary | ICD-10-CM | POA: Diagnosis present

## 2023-01-07 DIAGNOSIS — I110 Hypertensive heart disease with heart failure: Principal | ICD-10-CM | POA: Diagnosis present

## 2023-01-07 DIAGNOSIS — F419 Anxiety disorder, unspecified: Secondary | ICD-10-CM | POA: Diagnosis present

## 2023-01-07 LAB — CBC
HCT: 29.8 % — ABNORMAL LOW (ref 39.0–52.0)
Hemoglobin: 7.5 g/dL — ABNORMAL LOW (ref 13.0–17.0)
MCH: 20.4 pg — ABNORMAL LOW (ref 26.0–34.0)
MCHC: 25.2 g/dL — ABNORMAL LOW (ref 30.0–36.0)
MCV: 81.2 fL (ref 80.0–100.0)
Platelets: 349 10*3/uL (ref 150–400)
RBC: 3.67 MIL/uL — ABNORMAL LOW (ref 4.22–5.81)
RDW: 21.4 % — ABNORMAL HIGH (ref 11.5–15.5)
WBC: 13 10*3/uL — ABNORMAL HIGH (ref 4.0–10.5)
nRBC: 0.4 % — ABNORMAL HIGH (ref 0.0–0.2)

## 2023-01-07 LAB — TYPE AND SCREEN
ABO/RH(D): A POS
Antibody Screen: NEGATIVE

## 2023-01-07 SURGERY — ATRIAL FIBRILLATION ABLATION
Anesthesia: General

## 2023-01-07 MED ORDER — METHYLPREDNISOLONE SODIUM SUCC 125 MG IJ SOLR
40.0000 mg | Freq: Two times a day (BID) | INTRAMUSCULAR | Status: AC
  Administered 2023-01-07 – 2023-01-08 (×2): 40 mg via INTRAVENOUS
  Filled 2023-01-07 (×2): qty 2

## 2023-01-07 MED ORDER — CHLORHEXIDINE GLUCONATE 0.12 % MT SOLN
OROMUCOSAL | Status: AC
  Filled 2023-01-07: qty 15

## 2023-01-07 MED ORDER — PANTOPRAZOLE SODIUM 40 MG PO TBEC
40.0000 mg | DELAYED_RELEASE_TABLET | Freq: Every day | ORAL | Status: DC
  Administered 2023-01-07 – 2023-01-10 (×4): 40 mg via ORAL
  Filled 2023-01-07 (×4): qty 1

## 2023-01-07 MED ORDER — RIVAROXABAN 20 MG PO TABS
20.0000 mg | ORAL_TABLET | Freq: Every day | ORAL | Status: DC
  Administered 2023-01-07 – 2023-01-10 (×4): 20 mg via ORAL
  Filled 2023-01-07 (×4): qty 1

## 2023-01-07 MED ORDER — LEVALBUTEROL HCL 0.63 MG/3ML IN NEBU
0.6300 mg | INHALATION_SOLUTION | Freq: Three times a day (TID) | RESPIRATORY_TRACT | Status: DC
  Administered 2023-01-08 – 2023-01-10 (×7): 0.63 mg via RESPIRATORY_TRACT
  Filled 2023-01-07 (×7): qty 3

## 2023-01-07 MED ORDER — MOMETASONE FURO-FORMOTEROL FUM 200-5 MCG/ACT IN AERO
2.0000 | INHALATION_SPRAY | Freq: Two times a day (BID) | RESPIRATORY_TRACT | Status: DC
  Administered 2023-01-08 – 2023-01-10 (×4): 2 via RESPIRATORY_TRACT
  Filled 2023-01-07: qty 8.8

## 2023-01-07 MED ORDER — TAMSULOSIN HCL 0.4 MG PO CAPS
0.4000 mg | ORAL_CAPSULE | Freq: Every day | ORAL | Status: DC
  Administered 2023-01-07 – 2023-01-09 (×3): 0.4 mg via ORAL
  Filled 2023-01-07 (×3): qty 1

## 2023-01-07 MED ORDER — UMECLIDINIUM BROMIDE 62.5 MCG/ACT IN AEPB
1.0000 | INHALATION_SPRAY | Freq: Every day | RESPIRATORY_TRACT | Status: DC
  Administered 2023-01-09 – 2023-01-10 (×2): 1 via RESPIRATORY_TRACT
  Filled 2023-01-07: qty 7

## 2023-01-07 MED ORDER — LEVALBUTEROL TARTRATE 45 MCG/ACT IN AERO: 1.0000 | INHALATION_SPRAY | RESPIRATORY_TRACT | Status: DC | PRN

## 2023-01-07 MED ORDER — ACETAMINOPHEN 500 MG PO TABS: 1000.0000 mg | ORAL_TABLET | Freq: Once | ORAL | Status: DC

## 2023-01-07 MED ORDER — AMIODARONE HCL 200 MG PO TABS
200.0000 mg | ORAL_TABLET | Freq: Every day | ORAL | Status: DC
  Administered 2023-01-07 – 2023-01-10 (×4): 200 mg via ORAL
  Filled 2023-01-07 (×4): qty 1

## 2023-01-07 MED ORDER — SODIUM CHLORIDE 0.9 % IV SOLN: INTRAVENOUS | Status: DC

## 2023-01-07 MED ORDER — LEVALBUTEROL HCL 0.63 MG/3ML IN NEBU
0.6300 mg | INHALATION_SOLUTION | RESPIRATORY_TRACT | Status: DC | PRN
  Administered 2023-01-08 – 2023-01-10 (×3): 0.63 mg via RESPIRATORY_TRACT
  Filled 2023-01-07 (×2): qty 3

## 2023-01-07 MED ORDER — HYDROXYCHLOROQUINE SULFATE 200 MG PO TABS
200.0000 mg | ORAL_TABLET | Freq: Two times a day (BID) | ORAL | Status: DC
  Administered 2023-01-07 – 2023-01-10 (×7): 200 mg via ORAL
  Filled 2023-01-07 (×7): qty 1

## 2023-01-07 MED ORDER — POLYETHYLENE GLYCOL 3350 17 G PO PACK
17.0000 g | PACK | Freq: Every day | ORAL | Status: DC | PRN
  Administered 2023-01-09: 17 g via ORAL
  Filled 2023-01-07: qty 1

## 2023-01-07 MED ORDER — ACETAMINOPHEN 500 MG PO TABS
500.0000 mg | ORAL_TABLET | Freq: Four times a day (QID) | ORAL | Status: DC | PRN
  Administered 2023-01-09 – 2023-01-10 (×5): 500 mg via ORAL
  Filled 2023-01-07 (×5): qty 1

## 2023-01-07 MED ORDER — FUROSEMIDE 40 MG PO TABS
40.0000 mg | ORAL_TABLET | Freq: Two times a day (BID) | ORAL | Status: DC
  Administered 2023-01-07 – 2023-01-08 (×2): 40 mg via ORAL
  Filled 2023-01-07 (×2): qty 1

## 2023-01-07 MED ORDER — DILTIAZEM HCL ER COATED BEADS 120 MG PO CP24
120.0000 mg | ORAL_CAPSULE | Freq: Every day | ORAL | Status: DC
  Administered 2023-01-07 – 2023-01-10 (×4): 120 mg via ORAL
  Filled 2023-01-07 (×4): qty 1

## 2023-01-07 MED ORDER — FOLIC ACID 1 MG PO TABS
1.0000 mg | ORAL_TABLET | Freq: Every day | ORAL | Status: DC
  Administered 2023-01-07 – 2023-01-08 (×2): 1 mg via ORAL
  Filled 2023-01-07 (×2): qty 1

## 2023-01-07 MED ORDER — PREDNISONE 20 MG PO TABS: 40.0000 mg | ORAL_TABLET | Freq: Every day | ORAL | Status: DC

## 2023-01-07 MED ORDER — LEVALBUTEROL HCL 0.63 MG/3ML IN NEBU
0.6300 mg | INHALATION_SOLUTION | Freq: Four times a day (QID) | RESPIRATORY_TRACT | Status: DC
  Administered 2023-01-07 (×2): 0.63 mg via RESPIRATORY_TRACT
  Filled 2023-01-07 (×2): qty 3

## 2023-01-07 MED ORDER — METHOTREXATE 2.5 MG PO TABS
25.0000 mg | ORAL_TABLET | ORAL | Status: DC
  Filled 2023-01-07: qty 10

## 2023-01-07 NOTE — H&P (Signed)
Electrophysiology Office Note:    Date:  01/07/2023   ID:  Brian [name], DOB Nov 26, 1950, MRN [id number]  PCP:  Joycelyn Rua, MD   [facility] Providers Cardiologist:  Donato Schultz, MD Electrophysiologist:  Maurice Small, MD     Referring MD: No ref. provider found     History of Present Illness:    Brian [name] is a 72 y.o. male with a hx of PAF, atrial flutter, nephrolithiasis, HTN, cocaine use, tobacco use, HF with recovered EF referred for rhythm management.  He developed CHFpEF in the setting of atrial flutter that resolved with flutter ablation in 2018. He developed atrial fibrillation related to sepsis. He appears to have remained in sinus rhythm until follow-up in July, 2023. Cardioversion was deferred due to interruption of anticoagulation for serial urologic procedures regarding his bladder cancer.  He has been reluctant to take anticoagulation in the past.                                                                                                                                                   He underwent A-fib ablation in January 2024.  There was significant esophageal heating in the right inferior vein.  This limited ablation in this region.  The patient had multiple recurrences of atrial fibrillation during the procedure and received 2 cardioversions.  Since the ablation, he has had recurrence of arrhythmia with an atrial flutter and atrial fibrillation.  He was placed on amiodarone and continues amiodarone at present.  He has also had issues with anemia and feels fatigued.  At our last visit, he felt very fatigued coming to clinic from the parking lot despite being in normal sinus.  He was admitted for a COPD exacerbation; noted to have CHFpEF at that time and received some diuresis. He remains fatigued and short of breath.  Today He presents for AF ablation. Since his last visit with me, he was admitted with anemia and received 2 units of  pRBCs. In preoperative evaluation today, he is hypoxic with O2 sats 77-84% on room air. His hemoglobin has been trending down. I spoke with our anesthesiology team. We are going to cancel the ablation and have him admitted for further evaluation for hypoxemia. We may need to discontinue amiodarone due to his pulmonary disease.  Past Medical History:  Diagnosis Date   Anemia    none since 2019   Anxiety    Atrial flutter (HCC)    a. 08/2012   CAP (community acquired pneumonia) 09/30/2016   Cellulitis of left leg 11/29/2016   CHF (congestive heart failure) (HCC)    chronic diastolic chf, pt denies   Community acquired pneumonia of left lower lobe of lung    GERD (gastroesophageal reflux disease)    Gout    pt denies   History of kidney stones  passed   Hypertension    Obesity    Paroxysmal atrial fibrillation (HCC)    Pneumonia 09/2016   Positive urine drug screen    Rheumatoid arthritis (HCC)    "a little bit qwhere" (07/22/2017)   Sepsis (HCC) 11/29/2016   Shortness of breath    with heavy  exertion   Tachypnea    Tobacco abuse       Current Medications: Current Meds  Medication Sig   acetaminophen (TYLENOL) 500 MG tablet Take 500 mg by mouth every 6 (six) hours as needed for headache.   amiodarone (PACERONE) 200 MG tablet Take 1 tablet (200 mg total) by mouth daily. Please keep upcoming appt.with Cardiologist for future refills. Thank You.   budesonide-formoterol (SYMBICORT) 160-4.5 MCG/ACT inhaler Inhale 2 puffs into the lungs 2 (two) times daily.   diltiazem (CARDIZEM CD) 120 MG 24 hr capsule TAKE 1 CAPSULE BY MOUTH EVERY DAY   ferrous sulfate (SLOW IRON) 160 (50 Fe) MG TBCR SR tablet Take 1 tablet (160 mg total) by mouth daily.   folic acid (FOLVITE) 1 MG tablet Take 1 mg by mouth daily.   furosemide (LASIX) 40 MG tablet TAKE 1 TABLET BY MOUTH EVERY DAY   golimumab (SIMPONI ARIA) 50 MG/4ML SOLN injection Inject 50 mg into the vein every 8 (eight) weeks.    hydroxychloroquine (PLAQUENIL) 200 MG tablet Take 1 tablet (200 mg total) by mouth 2 (two) times daily.   KLOR-CON M20 20 MEQ tablet TAKE 1 TABLET BY MOUTH EVERY DAY (Patient taking differently: Take 20 mEq by mouth daily.)   levalbuterol (XOPENEX HFA) 45 MCG/ACT inhaler Inhale 1-2 puffs into the lungs every 4 (four) hours as needed for wheezing or shortness of breath.   methotrexate (RHEUMATREX) 2.5 MG tablet Take 25 mg by mouth every Friday.   pantoprazole (PROTONIX) 40 MG tablet Take 1 tablet (40 mg total) by mouth daily.   polyethylene glycol (MIRALAX / GLYCOLAX) 17 g packet Take 17 g by mouth daily. Continue daily while on iron. (Patient taking differently: Take 17 g by mouth daily as needed for mild constipation.)   rivaroxaban (XARELTO) 20 MG TABS tablet Take 1 tablet (20 mg total) by mouth daily with supper.   tamsulosin (FLOMAX) 0.4 MG CAPS capsule Take 0.4 mg by mouth at bedtime.   [DISCONTINUED] furosemide (LASIX) 40 MG tablet Take 1 tablet (40 mg total) by mouth daily.       EKGs/Labs/Other Studies Reviewed Today:    TTE 04/2022  1. Left ventricular ejection fraction, by estimation, is 50 to 55%. The  left ventricle has low normal function. The left ventricle has no regional  wall motion abnormalities. Left ventricular diastolic parameters are  indeterminate.   2. Right ventricular systolic function is normal. The right ventricular  size is normal. Tricuspid regurgitation signal is inadequate for assessing  PA pressure.   3. The mitral valve is normal in structure. No evidence of mitral valve  regurgitation. No evidence of mitral stenosis.   4. The aortic valve is normal in structure. Aortic valve regurgitation is  not visualized. No aortic stenosis is present.   5. The inferior vena cava is normal in size with greater than 50%  respiratory variability, suggesting right atrial pressure of 3 mmHg.   EKG:  Last EKG results: today -- sinus rhythm with 1st degree AV  block   Recent Labs: 04/29/2022: NT-Pro BNP 136 10/09/2022: TSH 0.945 10/16/2022: ALT 19 12/19/2022: B Natriuretic Peptide 336.5; Magnesium 1.8 12/20/2022: BUN 13;  Creatinine, Ser 0.71; Potassium 4.3; Sodium 137 01/04/2023: Hemoglobin 7.5; Platelets 359     Physical Exam:    VS:  BP 135/64   Pulse (!) 106   Temp 98.4 F (36.9 C) (Temporal)   Resp (!) 24   Ht 5' 9.5" (1.765 m)   Wt 117.9 kg   SpO2 (!) 84% Comment: Water engineer notified  BMI 37.84 kg/m     Wt Readings from Last 3 Encounters:  01/07/23 117.9 kg  01/01/23 120.7 kg  12/20/22 116.7 kg     GEN:  Well nourished, well developed in no acute distress CARDIAC: Irregular rhythm with normal rate, no murmurs, rubs, gallops RESPIRATORY:  Normal work of breathing MUSCULOSKELETAL: trace edema    ASSESSMENT & PLAN:    Persistent atrial fibrillation:  symptomatic with NYHA II-III symptoms.   He has had recurrence after ablation, also has had flutter, likely atypical -- both during the blinding period.  Will have to cancel the ablation today due to hypoxia.  I think, given esophageal heating during his prior ablation, he would benefit from PFA technology when he has his repeat ablation regardless  Fatigue Significant fatigue  despite being in normal sinus rhythm:   Atrial flutter: s/p ablation  History of CHFpEF: I think he will do much better in sinus rhythm.        Medication Adjustments/Labs and Tests Ordered: Current medicines are reviewed at length with the patient today.  Concerns regarding medicines are outlined above.  Orders Placed This Encounter  Procedures   CBC   Informed Consent Details: Physician/Practitioner Attestation; Transcribe to consent form and obtain patient signature   Initiate Pre-op Protocol   Void on call to EP Lab   Confirm CBC and BMP (or CMP) results within 7 days for inpatient and 30 days for outpatient:   Clip right and left femoral area PM before surgery   Clip right  internal jugular area PM before surgery   Pre-admission testing diagnosis   EP STUDY   Type and screen [facility]   Insert peripheral IV   Meds ordered this encounter  Medications   0.9 %  sodium chloride infusion   acetaminophen (TYLENOL) tablet 1,000 mg   chlorhexidine (PERIDEX) 0.12 % solution    Stalling, Tanya T: cabinet override     Signed, Maurice Small, MD  01/07/2023 12:39 PM    [facility]

## 2023-01-07 NOTE — Plan of Care (Signed)
  Problem: Activity: Goal: Ability to tolerate increased activity will improve Outcome: Progressing   

## 2023-01-07 NOTE — Anesthesia Preprocedure Evaluation (Deleted)
Anesthesia Evaluation    Airway       Comment: Previous grade I view with MAC 4, mask with OPA Dental   Pulmonary COPD, Current Smoker and Patient abstained from smoking.          Cardiovascular hypertension, Pt. on medications + CAD and +CHF  + dysrhythmias Atrial Fibrillation   TTE 05/18/2022: IMPRESSIONS     1. Left ventricular ejection fraction, by estimation, is 50 to 55%. The  left ventricle has low normal function. The left ventricle has no regional  wall motion abnormalities. Left ventricular diastolic parameters are  indeterminate.   2. Right ventricular systolic function is normal. The right ventricular  size is normal. Tricuspid regurgitation signal is inadequate for assessing  PA pressure.   3. The mitral valve is normal in structure. No evidence of mitral valve  regurgitation. No evidence of mitral stenosis.   4. The aortic valve is normal in structure. Aortic valve regurgitation is  not visualized. No aortic stenosis is present.   5. The inferior vena cava is normal in size with greater than 50%  respiratory variability, suggesting right atrial pressure of 3 mmHg.     Neuro/Psych  PSYCHIATRIC DISORDERS Anxiety     TIACVA    GI/Hepatic ,GERD  Medicated,,  Endo/Other    Renal/GU    Bladder cancer    Musculoskeletal  (+) Arthritis , Rheumatoid disorders,    Abdominal   Peds  Hematology  (+) Blood dyscrasia, anemia   Anesthesia Other Findings Last Xarelto:  Admitted 6/22-23 for anemia. Received 2 units pRBCs. Hgb on 7/8 7.5. Repeat today.  Reproductive/Obstetrics                              Anesthesia Physical Anesthesia Plan Anesthesia Quick Evaluation

## 2023-01-07 NOTE — Plan of Care (Signed)

## 2023-01-07 NOTE — H&P (Signed)
History and Physical    ZIQUAN FIDEL ZOX:[id number] DOB: 1951-02-10 DOA: 01/07/2023  PCP: Joycelyn Rua, MD (Confirm with patient/family/NH records and if not entered, this has to be [facility] at [facility] point [facility]) Patient coming from: Home  I have personally briefly reviewed patient's old medical records in [facility]  Chief Complaint: Wheezing, SOB  HPI: Brian [name] is a 72 y.o. male with medical history significant of PAF on Xarelto, COPD Gold stage IV, chronic HFpEF, RA on DMARDs, chronic anemia secondary to chronic illness, presented with wheezing and increasing shortness of breath.  Patient has poorly controlled A-fib and is scheduled for ablation today.  However before the procedure patient was evaluated by anesthesiology who found the patient in profound hypoxia with O2 saturation in the lower 80s.  Patient reported that he has long history of COPD Gold stage IV but no home oxygen.  Recently, patient in about 2 to 3 weeks has had poorly controlled COPD with persistent wheezing despite using the as needed Xopenex, and he also went to see his pulmonologist who increased his LABA and ICS dosage.  Despite patient continued to experience wheezing and exertional dyspnea at home.  Denies any chest pain, no fever or chills   Review of Systems: As per HPI otherwise 14 point review of systems negative.    Past Medical History:  Diagnosis Date   Anemia    none since 2019   Anxiety    Atrial flutter (HCC)    a. 08/2012   CAP (community acquired pneumonia) 09/30/2016   Cellulitis of left leg 11/29/2016   CHF (congestive heart failure) (HCC)    chronic diastolic chf, pt denies   Community acquired pneumonia of left lower lobe of lung    GERD (gastroesophageal reflux disease)    Gout    pt denies   History of kidney stones    passed   Hypertension    Obesity    Paroxysmal atrial fibrillation (HCC)    Pneumonia 09/2016   Positive urine drug screen    Rheumatoid  arthritis (HCC)    "a little bit qwhere" (07/22/2017)   Sepsis (HCC) 11/29/2016   Shortness of breath    with heavy  exertion   Tachypnea    Tobacco abuse     Past Surgical History:  Procedure Laterality Date   A-FLUTTER ABLATION N/A 12/29/2016   Procedure: A-Flutter Ablation;  Surgeon: Hillis Range, MD;  Location: [facility];  Service: Cardiovascular;  Laterality: N/A;   ATRIAL FIBRILLATION ABLATION N/A 07/20/2022   Procedure: ATRIAL FIBRILLATION ABLATION;  Surgeon: Maurice Small, MD;  Location: [facility] INVASIVE CV LAB;  Service: Cardiovascular;  Laterality: N/A;   CARDIOVERSION N/A 11/05/2016   Procedure: CARDIOVERSION;  Surgeon: Quintella Reichert, MD;  Location: [facility] ENDOSCOPY;  Service: Cardiovascular;  Laterality: N/A;   CARDIOVERSION N/A 06/09/2022   Procedure: CARDIOVERSION;  Surgeon: Jake Bathe, MD;  Location: [facility] ENDOSCOPY;  Service: Cardiovascular;  Laterality: N/A;   CARDIOVERSION N/A 09/14/2022   Procedure: CARDIOVERSION;  Surgeon: Christell Constant, MD;  Location: [facility] ENDOSCOPY;  Service: Cardiovascular;  Laterality: N/A;   IR THORACENTESIS ASP PLEURAL SPACE W/IMG GUIDE  10/01/2016   JOINT REPLACEMENT     Right hip and left knee   MICROLARYNGOSCOPY  09/02/2007   with excision of right vocal cord mass, Dr. Pollyann Kennedy   PROSTATE BIOPSY N/A 02/22/2020   Procedure: BIOPSY TRANSRECTAL ULTRASONIC PROSTATE (TUBP);  Surgeon: Bjorn Pippin, MD;  Location: [facility];  Service: Urology;  Laterality: N/A;   TOTAL HIP ARTHROPLASTY Right 11/17/2017   Procedure: RIGHT TOTAL HIP ARTHROPLASTY ANTERIOR APPROACH;  Surgeon: Tarry Kos, MD;  Location: [facility] OR;  Service: Orthopedics;  Laterality: Right;   TOTAL HIP ARTHROPLASTY Left 03/07/2018   Procedure: LEFT TOTAL HIP ARTHROPLASTY ANTERIOR APPROACH;  Surgeon: Tarry Kos, MD;  Location: [facility] OR;  Service: Orthopedics;  Laterality: Left;   TOTAL KNEE ARTHROPLASTY Left 07/22/2017   Procedure: LEFT TOTAL KNEE ARTHROPLASTY;  Surgeon:  Tarry Kos, MD;  Location: [facility] OR;  Service: Orthopedics;  Laterality: Left;   TRANSURETHRAL RESECTION OF BLADDER TUMOR N/A 04/02/2020   Procedure: RESTAGING TRANSURETHRAL RESECTION OF BLADDER TUMOR (TURBT);  Surgeon: Bjorn Pippin, MD;  Location: [facility];  Service: Urology;  Laterality: N/A;  GENERAL ANESTHESIA WIHT PARALYSIS   TRANSURETHRAL RESECTION OF BLADDER TUMOR WITH MITOMYCIN-C N/A 02/22/2020   Procedure: TRANSURETHRAL RESECTION OF BLADDER TUMOR WITH   GEMCITABINE;  Surgeon: Bjorn Pippin, MD;  Location[facility];  Service: Urology;  Laterality: N/A;     reports that he has been smoking cigarettes. He has a 22.5 pack-year smoking history. He has never used smokeless tobacco. He reports current alcohol use. He reports that he does not use drugs.  Allergies  Allergen Reactions   Aquacel-Ag Surgical Hydrofiber [Wound Dressings] Other (See Comments)    Tears skin   Tape Rash and Other (See Comments)    Paper tape is preferred, please!! Adhesive tape tears skin    Family History  Problem Relation Age of Onset   Cancer Mother    Heart disease Father    Heart attack Father    Cancer Father    Diabetes Sister      Prior to Admission medications   Medication Sig Start Date End Date Taking? Authorizing Provider  acetaminophen (TYLENOL) 500 MG tablet Take 500 mg by mouth every 6 (six) hours as needed for headache.   Yes [provider]  amiodarone (PACERONE) 200 MG tablet Take 1 tablet (200 mg total) by mouth daily. Please keep upcoming appt.with Cardiologist for future refills. Thank You. 10/27/22  Yes Sheilah Pigeon, PA-C  budesonide-formoterol [facility]) 160-4.5 MCG/ACT inhaler Inhale 2 puffs into the lungs 2 (two) times daily. 10/17/22  Yes Rai, Ripudeep K, MD  diltiazem (CARDIZEM CD) 120 MG 24 hr capsule TAKE 1 CAPSULE BY MOUTH EVERY DAY 12/29/22  Yes Jake Bathe, MD  ferrous sulfate (SLOW IRON) 160 (50 Fe) MG TBCR SR tablet Take 1  tablet (160 mg total) by mouth daily. 12/20/22  Yes Azucena Fallen, MD  folic acid (FOLVITE) 1 MG tablet Take 1 mg by mouth daily. 02/14/22  Yes [provider]  furosemide (LASIX) 40 MG tablet TAKE 1 TABLET BY MOUTH EVERY DAY 01/06/23  Yes Lanier Prude, MD  golimumab (SIMPONI ARIA) 50 MG/4ML SOLN injection Inject 50 mg into the vein every 8 (eight) weeks.   Yes [provider]  hydroxychloroquine (PLAQUENIL) 200 MG tablet Take 1 tablet (200 mg total) by mouth 2 (two) times daily. 12/20/22  Yes Azucena Fallen, MD  KLOR-CON M20 20 MEQ tablet TAKE 1 TABLET BY MOUTH EVERY DAY Patient taking differently: Take 20 mEq by mouth daily. 01/21/22  Yes Jake Bathe, MD  levalbuterol [facility] HFA) 45 MCG/ACT inhaler Inhale 1-2 puffs into the lungs every 4 (four) hours as needed for wheezing or shortness of breath. 10/17/22 10/17/23 Yes Rai, Delene Ruffini, MD  methotrexate (  RHEUMATREX) 2.5 MG tablet Take 25 mg by mouth every Friday. 04/26/17  Yes [provider]  pantoprazole (PROTONIX) 40 MG tablet Take 1 tablet (40 mg total) by mouth daily. 10/17/22 02/14/23 Yes Rai, Ripudeep K, MD  polyethylene glycol (MIRALAX / GLYCOLAX) 17 g packet Take 17 g by mouth daily. Continue daily while on iron. Patient taking differently: Take 17 g by mouth daily as needed for mild constipation. 12/20/22  Yes Azucena Fallen, MD  rivaroxaban (XARELTO) 20 MG TABS tablet Take 1 tablet (20 mg total) by mouth daily with supper. 12/23/22  Yes Azucena Fallen, MD  tamsulosin (FLOMAX) 0.4 MG CAPS capsule Take 0.4 mg by mouth at bedtime.   Yes [provider]    Physical Exam: Vitals:   01/07/23 1144 01/07/23 1316  BP: 135/64   Pulse: (!) 106   Resp: (!) 24   Temp: 98.4 F (36.9 C)   TempSrc: Temporal   SpO2: (!) 84% 97%  Weight: 117.9 kg   Height: 5' 9.5" (1.765 m)     Constitutional: NAD, calm, comfortable Vitals:   01/07/23 1144 01/07/23 1316  BP: 135/64   Pulse: (!)  106   Resp: (!) 24   Temp: 98.4 F (36.9 C)   TempSrc: Temporal   SpO2: (!) 84% 97%  Weight: 117.9 kg   Height: 5' 9.5" (1.765 m)    Eyes: PERRL, lids and conjunctivae normal ENMT: Mucous membranes are moist. Posterior pharynx clear of any exudate or lesions.Normal dentition.  Neck: normal, supple, no masses, no thyromegaly Respiratory: Diminished breathing sound bilaterally, diffused wheezing, no crackles.  Increasing respiratory effort. No accessory muscle use.  Cardiovascular: Regular rate and rhythm, no murmurs / rubs / gallops. 2+ extremity edema. 2+ pedal pulses. No carotid bruits.  Abdomen: no tenderness, no masses palpated. No hepatosplenomegaly. Bowel sounds positive.  Musculoskeletal: no clubbing / cyanosis. No joint deformity upper and lower extremities. Good ROM, no contractures. Normal muscle tone.  Skin: no rashes, lesions, ulcers. No induration Neurologic: CN 2-12 grossly intact. Sensation intact, DTR normal. Strength 5/5 in all 4.  Psychiatric: Normal judgment and insight. Alert and oriented x 3. Normal mood.    Labs on Admission: I have personally reviewed following labs and imaging studies  CBC: Recent Labs  Lab 01/04/23 1004 01/07/23 1230  WBC 11.8* 13.0*  NEUTROABS 9.3*  --   HGB 7.5* 7.5*  HCT 29.3* 29.8*  MCV 78* 81.2  PLT 359 349   Basic Metabolic Panel: No results for input(s): "NA", "K", "CL", "CO2", "GLUCOSE", "BUN", "CREATININE", "CALCIUM", "MG", "PHOS" in the last 168 hours. GFR: Estimated Creatinine Clearance: 108.2 mL/min (by C-G formula based on SCr of 0.71 mg/dL). Liver Function Tests: No results for input(s): "AST", "ALT", "ALKPHOS", "BILITOT", "PROT", "ALBUMIN" in the last 168 hours. No results for input(s): "LIPASE", "AMYLASE" in the last 168 hours. No results for input(s): "AMMONIA" in the last 168 hours. Coagulation Profile: No results for input(s): "INR", "PROTIME" in the last 168 hours. Cardiac Enzymes: No results for input(s):  "CKTOTAL", "CKMB", "CKMBINDEX", "TROPONINI" in the last 168 hours. BNP (last 3 results) Recent Labs    04/29/22 0944  PROBNP 136   HbA1C: No results for input(s): "HGBA1C" in the last 72 hours. CBG: No results for input(s): "GLUCAP" in the last 168 hours. Lipid Profile: No results for input(s): "CHOL", "HDL", "LDLCALC", "TRIG", "CHOLHDL", "LDLDIRECT" in the last 72 hours. Thyroid Function Tests: No results for input(s): "TSH", "T4TOTAL", "FREET4", "T3FREE", "THYROIDAB" in the last 72  hours. Anemia Panel: No results for input(s): "VITAMINB12", "FOLATE", "FERRITIN", "TIBC", "IRON", "RETICCTPCT" in the last 72 hours. Urine analysis:    Component Value Date/Time   COLORURINE RED (A) 05/09/2018 1217   APPEARANCEUR CLOUDY (A) 05/09/2018 1217   LABSPEC 1.025 05/09/2018 1217   PHURINE 6.5 05/09/2018 1217   GLUCOSEU (A) 05/09/2018 1217    TEST NOT REPORTED DUE TO COLOR INTERFERENCE OF URINE PIGMENT   HGBUR (A) 05/09/2018 1217    TEST NOT REPORTED DUE TO COLOR INTERFERENCE OF URINE PIGMENT   BILIRUBINUR (A) 05/09/2018 1217    TEST NOT REPORTED DUE TO COLOR INTERFERENCE OF URINE PIGMENT   KETONESUR (A) 05/09/2018 1217    TEST NOT REPORTED DUE TO COLOR INTERFERENCE OF URINE PIGMENT   PROTEINUR (A) 05/09/2018 1217    TEST NOT REPORTED DUE TO COLOR INTERFERENCE OF URINE PIGMENT   UROBILINOGEN 0.2 09/05/2012 0839   NITRITE (A) 05/09/2018 1217    TEST NOT REPORTED DUE TO COLOR INTERFERENCE OF URINE PIGMENT   LEUKOCYTESUR (A) 05/09/2018 1217    TEST NOT REPORTED DUE TO COLOR INTERFERENCE OF URINE PIGMENT    Radiological Exams on Admission: No results found.  ZOX:[id number]  Assessment/Plan Principal Problem:   COPD (chronic obstructive pulmonary disease) (HCC) Active Problems:   COPD with acute exacerbation (HCC)  (please populate well all problems here in Problem List. (For example, if patient is on BP meds at home and you resume or decide to hold them, it is a problem that needs to  be her. Same for CAD, COPD, HLD and so on)  Acute hypoxic respiratory failure Acute COPD exacerbation -Start IV Solu-Medrol bridging for p.o. prednisone, probably tomorrow -Ambulation pulse ox tomorrow morning, may need home O2 evaluation -Continue Topamax nebulizing and as needed -Continue Breo -Add spiriva -Incentive spirometry  Acute on chronic HFpEF decompensation -Increase p.o. Lasix to 40 mg twice daily -Patient follow-up with cardiology  Chronic anemia secondary to chronic illness -Normal iron study.  Appears that patient cannot tolerate iron supplement, but he does have a normal iron study in June this year.  Arguing against GI bleed. -Clinically suspect anemia secondary to methotrexate, recommend he talks to his rheumatology about switching methotrexate to other DM ARDS -H/H stable compared to last month  PAF -Elective ablation -EKG pending -Continue Xarelto -Continue Cardizem and amiodarone  HTN -StAble, continue Cardizem  RA -Continue hydroxychloroquine -Continue methotrexate     DVT prophylaxis: Xarelto Code Status: Full code Family Communication: Daughter at bedside Disposition Plan: Expect less than 2 midnight hospital stay Consults called: Cardiology Admission status: Telemetry observation   Emeline General MD [facility] Pager [phone]  01/07/2023, 2:09 PM

## 2023-01-08 DIAGNOSIS — Z96643 Presence of artificial hip joint, bilateral: Secondary | ICD-10-CM | POA: Diagnosis present

## 2023-01-08 DIAGNOSIS — Z6839 Body mass index (BMI) 39.0-39.9, adult: Secondary | ICD-10-CM | POA: Diagnosis not present

## 2023-01-08 DIAGNOSIS — G4700 Insomnia, unspecified: Secondary | ICD-10-CM | POA: Diagnosis present

## 2023-01-08 DIAGNOSIS — J441 Chronic obstructive pulmonary disease with (acute) exacerbation: Secondary | ICD-10-CM | POA: Diagnosis not present

## 2023-01-08 DIAGNOSIS — R0602 Shortness of breath: Secondary | ICD-10-CM | POA: Diagnosis not present

## 2023-01-08 DIAGNOSIS — Z79899 Other long term (current) drug therapy: Secondary | ICD-10-CM | POA: Diagnosis not present

## 2023-01-08 DIAGNOSIS — Z8673 Personal history of transient ischemic attack (TIA), and cerebral infarction without residual deficits: Secondary | ICD-10-CM | POA: Diagnosis not present

## 2023-01-08 DIAGNOSIS — M069 Rheumatoid arthritis, unspecified: Secondary | ICD-10-CM | POA: Diagnosis not present

## 2023-01-08 DIAGNOSIS — Z96652 Presence of left artificial knee joint: Secondary | ICD-10-CM | POA: Diagnosis present

## 2023-01-08 DIAGNOSIS — I4819 Other persistent atrial fibrillation: Secondary | ICD-10-CM | POA: Diagnosis not present

## 2023-01-08 DIAGNOSIS — E669 Obesity, unspecified: Secondary | ICD-10-CM | POA: Diagnosis not present

## 2023-01-08 DIAGNOSIS — I272 Pulmonary hypertension, unspecified: Secondary | ICD-10-CM | POA: Diagnosis present

## 2023-01-08 DIAGNOSIS — Z539 Procedure and treatment not carried out, unspecified reason: Secondary | ICD-10-CM | POA: Diagnosis present

## 2023-01-08 DIAGNOSIS — Z8249 Family history of ischemic heart disease and other diseases of the circulatory system: Secondary | ICD-10-CM | POA: Diagnosis not present

## 2023-01-08 DIAGNOSIS — D509 Iron deficiency anemia, unspecified: Secondary | ICD-10-CM | POA: Diagnosis not present

## 2023-01-08 DIAGNOSIS — J9601 Acute respiratory failure with hypoxia: Secondary | ICD-10-CM | POA: Diagnosis present

## 2023-01-08 DIAGNOSIS — Z7901 Long term (current) use of anticoagulants: Secondary | ICD-10-CM | POA: Diagnosis not present

## 2023-01-08 DIAGNOSIS — Z87891 Personal history of nicotine dependence: Secondary | ICD-10-CM | POA: Diagnosis not present

## 2023-01-08 DIAGNOSIS — I10 Essential (primary) hypertension: Secondary | ICD-10-CM | POA: Diagnosis not present

## 2023-01-08 DIAGNOSIS — I5033 Acute on chronic diastolic (congestive) heart failure: Secondary | ICD-10-CM | POA: Diagnosis not present

## 2023-01-08 DIAGNOSIS — Z833 Family history of diabetes mellitus: Secondary | ICD-10-CM | POA: Diagnosis not present

## 2023-01-08 DIAGNOSIS — J449 Chronic obstructive pulmonary disease, unspecified: Secondary | ICD-10-CM | POA: Diagnosis present

## 2023-01-08 DIAGNOSIS — Z7951 Long term (current) use of inhaled steroids: Secondary | ICD-10-CM | POA: Diagnosis not present

## 2023-01-08 DIAGNOSIS — I2510 Atherosclerotic heart disease of native coronary artery without angina pectoris: Secondary | ICD-10-CM | POA: Diagnosis present

## 2023-01-08 DIAGNOSIS — I071 Rheumatic tricuspid insufficiency: Secondary | ICD-10-CM | POA: Diagnosis present

## 2023-01-08 DIAGNOSIS — I110 Hypertensive heart disease with heart failure: Secondary | ICD-10-CM | POA: Diagnosis present

## 2023-01-08 DIAGNOSIS — F1490 Cocaine use, unspecified, uncomplicated: Secondary | ICD-10-CM | POA: Diagnosis present

## 2023-01-08 DIAGNOSIS — I509 Heart failure, unspecified: Secondary | ICD-10-CM | POA: Insufficient documentation

## 2023-01-08 LAB — CBC
HCT: 29 % — ABNORMAL LOW (ref 39.0–52.0)
Hemoglobin: 7.3 g/dL — ABNORMAL LOW (ref 13.0–17.0)
MCH: 20.4 pg — ABNORMAL LOW (ref 26.0–34.0)
MCHC: 25.2 g/dL — ABNORMAL LOW (ref 30.0–36.0)
MCV: 81 fL (ref 80.0–100.0)
Platelets: 318 10*3/uL (ref 150–400)
RBC: 3.58 MIL/uL — ABNORMAL LOW (ref 4.22–5.81)
RDW: 21.1 % — ABNORMAL HIGH (ref 11.5–15.5)
WBC: 9.9 10*3/uL (ref 4.0–10.5)
nRBC: 0.5 % — ABNORMAL HIGH (ref 0.0–0.2)

## 2023-01-08 LAB — BASIC METABOLIC PANEL
Anion gap: 8 (ref 5–15)
BUN: 18 mg/dL (ref 8–23)
CO2: 30 mmol/L (ref 22–32)
Calcium: 8.3 mg/dL — ABNORMAL LOW (ref 8.9–10.3)
Chloride: 99 mmol/L (ref 98–111)
Creatinine, Ser: 0.87 mg/dL (ref 0.61–1.24)
GFR, Estimated: >60 mL/min (ref >60)
Glucose, Bld: 180 mg/dL — ABNORMAL HIGH (ref 70–99)
Potassium: 4.7 mmol/L (ref 3.5–5.1)
Sodium: 137 mmol/L (ref 135–145)

## 2023-01-08 LAB — MAGNESIUM: Magnesium: 2 mg/dL (ref 1.7–2.4)

## 2023-01-08 MED ORDER — SPIRONOLACTONE 12.5 MG HALF TABLET
12.5000 mg | ORAL_TABLET | Freq: Every day | ORAL | Status: DC
  Administered 2023-01-08 – 2023-01-10 (×3): 12.5 mg via ORAL
  Filled 2023-01-08 (×3): qty 1

## 2023-01-08 MED ORDER — FUROSEMIDE 10 MG/ML IJ SOLN
60.0000 mg | Freq: Two times a day (BID) | INTRAMUSCULAR | Status: DC
  Administered 2023-01-08 – 2023-01-10 (×5): 60 mg via INTRAVENOUS
  Filled 2023-01-08 (×5): qty 6

## 2023-01-08 MED ORDER — MELATONIN 3 MG PO TABS
3.0000 mg | ORAL_TABLET | Freq: Every evening | ORAL | Status: DC | PRN
  Administered 2023-01-08 – 2023-01-09 (×2): 3 mg via ORAL
  Filled 2023-01-08 (×2): qty 1

## 2023-01-08 NOTE — Assessment & Plan Note (Signed)
Patient had systemic corticosteroids and bronchodilator therapy. Exacerbation has improved.   Plan to discontinue prednisone.  Continue dulera and levalbuterol.

## 2023-01-08 NOTE — Assessment & Plan Note (Signed)
Calculated BMI 39,7

## 2023-01-08 NOTE — Plan of Care (Signed)

## 2023-01-08 NOTE — Progress Notes (Signed)
Mobility Specialist Progress Note:   01/08/23 1600  Oxygen Therapy  SpO2 93 %  O2 Device Room Air  Mobility  Activity Ambulated with assistance in hallway  Level of Assistance Contact guard assist, steadying assist  Assistive Device None  Distance Ambulated (ft) 50 ft  Activity Response Tolerated well  Mobility Referral Yes  $Mobility charge 1 Mobility  Mobility Specialist Start Time (ACUTE ONLY) 1539  Mobility Specialist Stop Time (ACUTE ONLY) 1600  Mobility Specialist Time Calculation (min) (ACUTE ONLY) 21 min    Pre Mobility: 95 HR, 93% SpO2 RA During Mobility: 110 HR, 96% SpO2 RA Post Mobility:  96 HR, 97% SpO2 RA  Pt received in bed, breathing on RA with SpO2 levels in the high 90s. Agreeable to mobility. Ambulated w/ no AD, using contact guard. C/o of fatigue and SOB. VSS throughout. Pt left in bed with call bell and all needs met.  D'Vante Earlene Plater Mobility Specialist Please contact via Special educational needs teacher or Rehab office at [phone]

## 2023-01-08 NOTE — Assessment & Plan Note (Signed)
Continue anticoagulation for atrial fibrillation.  Patient not on statin.

## 2023-01-08 NOTE — Assessment & Plan Note (Signed)
Continue with methotrexate and hydroxychloroquine

## 2023-01-08 NOTE — Progress Notes (Signed)
  Progress Note   Patient: Brian [name] ZOX:[id number] DOB: [birth year] DOA: 01/07/2023     0 DOS: the patient was seen and examined on 01/08/2023   Brief hospital course: [name] was admitted to the hospital with the working diagnosis of acute hypoxemic respiratory failure.   72 yo male with the past medical history of paroxysmal atrial fibrillation, COPD, heart failure, rheumatoid arthritis, and chronic anemia who presented to the hospital for elective atrial fibrillation ablation. Procedure was cancelled due to hypoxemia. Patient reported 2 to 3 weeks of worsening dyspnea with wheezing, refractive to bronchodilator therapy and inhaled corticosteroids. Positive lower extremity edema. On his initial physical examination his blood pressure was 135/64, HR 106, RR 24 and 02 saturation 84%, lungs with decreased breath sounds bilaterally and diffuse wheezing, increased work of breathing, heart with S1 and S2 present, irregularly irregular with no gallops or rubs, abdomen with no distention, positive lower extremity edema, pitting +++.   Chest radiograph with cardiomegaly, bilateral hilar vascular congestion, bilateral central interstitial infiltrates.   Assessment and Plan: * Acute on chronic diastolic CHF (congestive heart failure) (HCC) Echocardiogram from 2023 with preserved LV systolic function.   Patient continue with signs of volume overload.  Urine output documented 400 cc  Systolic blood pressure is 120 to 130 mmHg.   Plan to resume diuresis with IV furosemide 60 mg IV q12 hr Add spironolactone If renal function stable may add SGLT 2 inh.  If blood pressure stable will benefit from ARB. Check new echocardiogram.    Essential hypertension Continue blood pressure monitoring.   Persistent atrial fibrillation (HCC) Currently rate controlled.  Plan to continue amiodarone and diltiazem. Anticoagulation with rivaroxaban.   History of TIA (transient ischemic attack) Continue  anticoagulation for atrial fibrillation.  Patient not on statin.  COPD (chronic obstructive pulmonary disease) (HCC) Patient had systemic corticosteroids and bronchodilator therapy. Exacerbation has improved.   Plan to discontinue prednisone.  Continue dulera and levalbuterol.   Rheumatoid arthritis (HCC) Continue with methotrexate and hydroxychloroquine.   Iron deficiency anemia Hgb has been stable.   Class 2 obesity Calculated BMI 39,7         Subjective: Patient with no chest pain, dyspnea has improved but not back to baseline, continue to have edema lower extremities.   Physical Exam: Vitals:   01/08/23 0330 01/08/23 0731 01/08/23 1001 01/08/23 1212  BP: 127/71  123/87 (!) 128/99  Pulse: 92  89 87  Resp: 19  18 19   Temp: 98.2 F (36.8 C)  98.4 F (36.9 C) 97.9 F (36.6 C)  TempSrc: Oral  Oral Oral  SpO2: 97% 97%    Weight: 123.8 kg     Height:       Neurology awake and alert ENT with mild pallor Cardiovascular with S1 and S2 present, irregularly irregular with no gallops, rubs or murmurs Respiratory with prolonged expiratory phase with positive rales at bases, no wheezing or rhonchi Abdomen with no distention  Positive lower extremity edema ++ pitting  Data Reviewed:    Family Communication: .I spoke with patient's son at the bedside, we talked in detail about patient's condition, plan of care and prognosis and all questions were addressed.   Disposition: Status is: Inpatient Remains inpatient appropriate because: IV diuresis   Planned Discharge Destination: Home    Author: Coralie Keens, MD 01/08/2023 3:21 PM  For on call review [email or web address].

## 2023-01-08 NOTE — Assessment & Plan Note (Addendum)
Echocardiogram with mild reduction in systolic function to 45 to 50%, moderate LVH, RV systolic function with moderate reduction, RV size with mild enlargement, RVSP 45.5 mmHg. LA with mild dilatation, RA with moderate dilatation, moderate tricuspid regurgitation.  Acute on chronic core pulmonale. Pulmonary hypertension.   Urine output is 1,380 ml  Systolic blood pressure is 130 to 120 mmHg.  Continue to have peripheral edema.   Continue with IV furosemide 60 mg IV q12 hr Continue with spironolactone May add ARB and SGLT 2 inh if renal function and blood pressure stable.  Add Ted hose to lower extremities.

## 2023-01-08 NOTE — Assessment & Plan Note (Signed)
Continue blood pressure monitoring  

## 2023-01-08 NOTE — Assessment & Plan Note (Signed)
Currently rate controlled.  Plan to continue amiodarone and diltiazem. Anticoagulation with rivaroxaban.

## 2023-01-08 NOTE — Hospital Course (Signed)
Brian [name] was admitted to the hospital with the working diagnosis of acute hypoxemic respiratory failure.   72 yo male with the past medical history of paroxysmal atrial fibrillation, COPD, heart failure, rheumatoid arthritis, and chronic anemia who presented to the hospital for elective atrial fibrillation ablation. Procedure was cancelled due to hypoxemia. Patient reported 2 to 3 weeks of worsening dyspnea with wheezing, refractive to bronchodilator therapy and inhaled corticosteroids. Positive lower extremity edema. On his initial physical examination his blood pressure was 135/64, HR 106, RR 24 and 02 saturation 84%, lungs with decreased breath sounds bilaterally and diffuse wheezing, increased work of breathing, heart with S1 and S2 present, irregularly irregular with no gallops or rubs, abdomen with no distention, positive lower extremity edema, pitting +++.   Na 137, K 4,7 Cl 99, bicarbonate 30 glucose 180 bun 18 cr 0,87  Mg 2,0  Wbc 9.9 hgb 7.3 hct 29,0 plt 318   Chest radiograph with cardiomegaly, bilateral hilar vascular congestion, bilateral central interstitial infiltrates.

## 2023-01-08 NOTE — Progress Notes (Signed)
Heart Failure Navigator Progress Note  Assessed for Heart & Vascular TOC clinic readiness.  Patient does not meet criteria due to EF 50-55%, admission for COPD per Dr. Ella Jubilee, No HF TOC. .   Navigator will sign off at this time.   Rhae Hammock, BSN, Scientist, clinical (histocompatibility and immunogenetics) Only

## 2023-01-08 NOTE — Progress Notes (Signed)
TRH night cross cover note:   I was notified by RN of the patient's request for a sleep aid. I subsequently placed order for prn melatonin for insomnia.     Brian Blades, DO Hospitalist  

## 2023-01-08 NOTE — Progress Notes (Signed)
Nurse requested Mobility Specialist to perform oxygen saturation test with pt which includes removing pt from oxygen both at rest and while ambulating.  Below are the results from that testing.     Patient Saturations on Room Air at Rest = spO2 93%  Patient Saturations on Room Air while Ambulating = sp02 96% .  Rested and performed pursed lip breathing for 1 minute with sp02 at 97%.   Reported results to nurse.    D'Vante Earlene Plater Mobility Specialist Please contact via Special educational needs teacher or Rehab office at [phone]

## 2023-01-08 NOTE — Assessment & Plan Note (Signed)
Hgb has been stable.   

## 2023-01-09 ENCOUNTER — Inpatient Hospital Stay (HOSPITAL_COMMUNITY): Payer: Medicare Other

## 2023-01-09 DIAGNOSIS — I4819 Other persistent atrial fibrillation: Secondary | ICD-10-CM | POA: Diagnosis not present

## 2023-01-09 DIAGNOSIS — I5033 Acute on chronic diastolic (congestive) heart failure: Secondary | ICD-10-CM | POA: Diagnosis not present

## 2023-01-09 DIAGNOSIS — R0602 Shortness of breath: Secondary | ICD-10-CM

## 2023-01-09 DIAGNOSIS — D509 Iron deficiency anemia, unspecified: Secondary | ICD-10-CM | POA: Diagnosis not present

## 2023-01-09 DIAGNOSIS — I10 Essential (primary) hypertension: Secondary | ICD-10-CM | POA: Diagnosis not present

## 2023-01-09 LAB — ECHOCARDIOGRAM COMPLETE
Area-P 1/2: 3.42 cm2
Calc EF: 57.2 %
Height: 69.5 in
MV VTI: 3.07 cm2
S' Lateral: 3.3 cm
Single Plane A2C EF: 66.5 %
Single Plane A4C EF: 45.2 %
Weight: 4371.2 oz

## 2023-01-09 LAB — BASIC METABOLIC PANEL
Anion gap: 10 (ref 5–15)
BUN: 21 mg/dL (ref 8–23)
CO2: 32 mmol/L (ref 22–32)
Calcium: 8.4 mg/dL — ABNORMAL LOW (ref 8.9–10.3)
Chloride: 95 mmol/L — ABNORMAL LOW (ref 98–111)
Creatinine, Ser: 1.01 mg/dL (ref 0.61–1.24)
GFR, Estimated: >60 mL/min (ref >60)
Glucose, Bld: 135 mg/dL — ABNORMAL HIGH (ref 70–99)
Potassium: 3.8 mmol/L (ref 3.5–5.1)
Sodium: 137 mmol/L (ref 135–145)

## 2023-01-09 LAB — MAGNESIUM: Magnesium: 2 mg/dL (ref 1.7–2.4)

## 2023-01-09 MED ORDER — LIDOCAINE 5 % EX PTCH
1.0000 | MEDICATED_PATCH | CUTANEOUS | Status: DC
  Administered 2023-01-09 – 2023-01-10 (×2): 1 via TRANSDERMAL
  Filled 2023-01-09 (×2): qty 1

## 2023-01-09 NOTE — Progress Notes (Signed)
Progress Note   Patient: Brian [name] WGN:[id number] DOB: [birth year] DOA: 01/07/2023     1 DOS: the patient was seen and examined on 01/09/2023   Brief hospital course: Mr. Glad was admitted to the hospital with the working diagnosis of acute hypoxemic respiratory failure.   72 yo male with the past medical history of paroxysmal atrial fibrillation, COPD, heart failure, rheumatoid arthritis, and chronic anemia who presented to the hospital for elective atrial fibrillation ablation. Procedure was cancelled due to hypoxemia. Patient reported 2 to 3 weeks of worsening dyspnea with wheezing, refractive to bronchodilator therapy and inhaled corticosteroids. Positive lower extremity edema. On his initial physical examination his blood pressure was 135/64, HR 106, RR 24 and 02 saturation 84%, lungs with decreased breath sounds bilaterally and diffuse wheezing, increased work of breathing, heart with S1 and S2 present, irregularly irregular with no gallops or rubs, abdomen with no distention, positive lower extremity edema, pitting +++.   Na 137, K 4,7 Cl 99, bicarbonate 30 glucose 180 bun 18 cr 0,87  Mg 2,0  Wbc 9.9 hgb 7.3 hct 29,0 plt 318   Chest radiograph with cardiomegaly, bilateral hilar vascular congestion, bilateral central interstitial infiltrates.   Assessment and Plan: * Acute on chronic diastolic CHF (congestive heart failure) (HCC) Echocardiogram with mild reduction in systolic function to 45 to 50%, moderate LVH, RV systolic function with moderate reduction, RV size with mild enlargement, RVSP 45.5 mmHg. LA with mild dilatation, RA with moderate dilatation, moderate tricuspid regurgitation.  Acute on chronic core pulmonale. Pulmonary hypertension.   Urine output is 1,380 ml  Systolic blood pressure is 130 to 120 mmHg.  Continue to have peripheral edema.   Continue with IV furosemide 60 mg IV q12 hr Continue with spironolactone May add ARB and SGLT 2 inh if renal function  and blood pressure stable.  Add Ted hose to lower extremities.   Essential hypertension Continue blood pressure monitoring. Tolerating well diuresis.   Persistent atrial fibrillation (HCC) Currently rate controlled.  Plan to continue amiodarone and diltiazem. Anticoagulation with rivaroxaban.   History of TIA (transient ischemic attack) Continue anticoagulation for atrial fibrillation.  Patient not on statin.  COPD (chronic obstructive pulmonary disease) (HCC) Patient had systemic corticosteroids and bronchodilator therapy. Exacerbation has improved.   Plan to discontinue prednisone.  Continue dulera and levalbuterol.   Rheumatoid arthritis (HCC) Continue with methotrexate and hydroxychloroquine.   Iron deficiency anemia Serum iron 103, TIBC 501, transferrin saturation 21 and ferritin 9.  Follow cell count in am.  Patient will benefit from IV iron infusion, looks like he did not tolerate iron supplementation in the past.    Class 2 obesity Calculated BMI 39,7         Subjective: Patient with no chest pain, dyspnea and edema have improved but not back to baseline.   Physical Exam: Vitals:   01/09/23 0745 01/09/23 0847 01/09/23 1405 01/09/23 1527  BP:   123/66 125/63  Pulse:    91  Resp: 19  18 16   Temp: 97.7 F (36.5 C)  97.6 F (36.4 C) (!) 97.4 F (36.3 C)  TempSrc: Oral  Oral Oral  SpO2:  98%  96%  Weight:      Height:       Neurology awake and alert ENT with mild pallor Cardiovascular with S1 and S2 present, irregularly irregular, with positive systolic murmur at the right lower sternal border. No gallops.  No JVD Positive lower extremity edema pitting ++  Respiratory with no rales  or wheezing, no rhonchi Abdomen with no distention  Data Reviewed:    Family Communication:   Disposition: Status is: Inpatient Remains inpatient appropriate because: IV diuresis  Planned Discharge Destination: Home    Author: Coralie Keens,  MD 01/09/2023 3:43 PM  For on call review [email or web address].

## 2023-01-09 NOTE — Plan of Care (Signed)
  Problem: Education: Goal: Knowledge of General Education information will improve Description: Including pain rating scale, medication(s)/side effects and non-pharmacologic comfort measures Outcome: Progressing   Problem: Health Behavior/Discharge Planning: Goal: Ability to manage health-related needs will improve Outcome: Progressing   Problem: Clinical Measurements: Goal: Respiratory complications will improve Outcome: Progressing   Problem: Clinical Measurements: Goal: Cardiovascular complication will be avoided Outcome: Progressing   Problem: Activity: Goal: Risk for activity intolerance will decrease Outcome: Progressing   Problem: Elimination: Goal: Will not experience complications related to bowel motility Outcome: Progressing Go Problem: Pain Managment: Goal: General experience of comfort will improve Outcome: Progressing   Problem: Safety: Goal: Ability to remain free from injury will improve Outcome: Progressing   Problem: Skin Integrity: Goal: Risk for impaired skin integrity will decrease Outcome: Progressing

## 2023-01-09 NOTE — Progress Notes (Signed)
  Echocardiogram 2D Echocardiogram has been performed.  Janalyn Harder 01/09/2023, 8:45 AM

## 2023-01-09 NOTE — Progress Notes (Signed)
TRH night cross cover note:   I was notified by RN that the patient is c/o of some right flank discomfort that has been aching since time of a fall before this current admission. Pain refractory to prn tylenol. I subsequently ordered a lidocaine patch to be applied to the affected right flank area.     Newton Pigg, DO Hospitalist

## 2023-01-10 DIAGNOSIS — M069 Rheumatoid arthritis, unspecified: Secondary | ICD-10-CM | POA: Diagnosis not present

## 2023-01-10 DIAGNOSIS — I10 Essential (primary) hypertension: Secondary | ICD-10-CM

## 2023-01-10 DIAGNOSIS — I5033 Acute on chronic diastolic (congestive) heart failure: Secondary | ICD-10-CM | POA: Diagnosis not present

## 2023-01-10 DIAGNOSIS — J441 Chronic obstructive pulmonary disease with (acute) exacerbation: Secondary | ICD-10-CM

## 2023-01-10 DIAGNOSIS — D509 Iron deficiency anemia, unspecified: Secondary | ICD-10-CM

## 2023-01-10 DIAGNOSIS — I4819 Other persistent atrial fibrillation: Secondary | ICD-10-CM

## 2023-01-10 DIAGNOSIS — E669 Obesity, unspecified: Secondary | ICD-10-CM

## 2023-01-10 DIAGNOSIS — Z8673 Personal history of transient ischemic attack (TIA), and cerebral infarction without residual deficits: Secondary | ICD-10-CM

## 2023-01-10 LAB — BASIC METABOLIC PANEL
Anion gap: 8 (ref 5–15)
BUN: 23 mg/dL (ref 8–23)
CO2: 34 mmol/L — ABNORMAL HIGH (ref 22–32)
Calcium: 8.4 mg/dL — ABNORMAL LOW (ref 8.9–10.3)
Chloride: 94 mmol/L — ABNORMAL LOW (ref 98–111)
Creatinine, Ser: 0.99 mg/dL (ref 0.61–1.24)
GFR, Estimated: >60 mL/min (ref >60)
Glucose, Bld: 110 mg/dL — ABNORMAL HIGH (ref 70–99)
Potassium: 3.5 mmol/L (ref 3.5–5.1)
Sodium: 136 mmol/L (ref 135–145)

## 2023-01-10 LAB — CBC
HCT: 27.1 % — ABNORMAL LOW (ref 39.0–52.0)
Hemoglobin: 7.1 g/dL — ABNORMAL LOW (ref 13.0–17.0)
MCH: 21.1 pg — ABNORMAL LOW (ref 26.0–34.0)
MCHC: 26.2 g/dL — ABNORMAL LOW (ref 30.0–36.0)
MCV: 80.4 fL (ref 80.0–100.0)
Platelets: 311 10*3/uL (ref 150–400)
RBC: 3.37 MIL/uL — ABNORMAL LOW (ref 4.22–5.81)
RDW: 20.9 % — ABNORMAL HIGH (ref 11.5–15.5)
WBC: 12.3 10*3/uL — ABNORMAL HIGH (ref 4.0–10.5)
nRBC: 0.6 % — ABNORMAL HIGH (ref 0.0–0.2)

## 2023-01-10 LAB — MAGNESIUM: Magnesium: 1.9 mg/dL (ref 1.7–2.4)

## 2023-01-10 MED ORDER — FUROSEMIDE 40 MG PO TABS: 40.0000 mg | ORAL_TABLET | Freq: Two times a day (BID) | ORAL | 3 refills | Status: DC

## 2023-01-10 MED ORDER — SPIRONOLACTONE 25 MG PO TABS: 12.5000 mg | ORAL_TABLET | Freq: Every day | ORAL | 1 refills | Status: DC

## 2023-01-10 NOTE — Progress Notes (Signed)
Physical Therapy Note  (Full PT eval to follow)  SATURATION QUALIFICATIONS: (This note is used to comply with regulatory documentation for home oxygen)  Patient Saturations on Room Air at Rest = 95%  Patient Saturations on Room Air while Ambulating = 92%  Patient does not require supplemental oxygen to maintain oxygen saturations at acceptable, safe levels with physical activity; Lowest O2 sat observed during amb was 89%, and O2 sats recovered to 98-100% with focused deep breathing (and lessening grip on RW)  Van Clines, PT  Acute Rehabilitation Services Office [phone] Secure Chat welcomed

## 2023-01-10 NOTE — Plan of Care (Signed)
  Problem: Activity: Goal: Ability to tolerate increased activity will improve Outcome: Adequate for Discharge Goal: Will verbalize the importance of balancing activity with adequate rest periods Outcome: Adequate for Discharge   Problem: Respiratory: Goal: Ability to maintain a clear airway will improve Outcome: Adequate for Discharge Goal: Levels of oxygenation will improve Outcome: Adequate for Discharge Goal: Ability to maintain adequate ventilation will improve Outcome: Adequate for Discharge

## 2023-01-10 NOTE — Evaluation (Signed)
Physical Therapy Evaluation and Discharge Patient Details Name: Brian [name] MRN: [id number] DOB: 1950/10/11 Today's Date: 01/10/2023  History of Present Illness  Brian [name] is a 71 y.o. male presented with wheezing and increasing shortness of breath; was planning for an ablation, but SOB and hypoxemia necessitated potponing procedure; with medical history significant of PAF on Xarelto, COPD Gold stage IV, chronic HFpEF, RA on DMARDs, chronic anemia secondary to chronic illness,  Clinical Impression   Patient evaluated by Physical Therapy with no further acute PT needs identified, as the plan is for pt to dc home today; All education has been completed and the patient has no further questions; recommending HHPT, however, pt states he does not want HHPT services See below for any follow-up Physical Therapy or equipment needs. PT is signing off. Thank you for this referral.         Assistance Recommended at Discharge PRN  If plan is discharge home, recommend the following:  Can travel by private vehicle  Assistance with cooking/housework        Equipment Recommendations None recommended by PT  Recommendations for Other Services       Functional Status Assessment Patient has had a recent decline in their functional status and demonstrates the ability to make significant improvements in function in a reasonable and predictable amount of time.     Precautions / Restrictions Precautions Precautions: Fall Precaution Comments: Fall risk is present, but minimized with RW      Mobility  Bed Mobility                    Transfers Overall transfer level: Needs assistance Equipment used: Rolling walker (2 wheels) Transfers: Sit to/from Stand Sit to Stand: Min guard (without physical assist)           General transfer comment: Good rise    Ambulation/Gait Ambulation/Gait assistance: Min guard (with and without physical contact) Gait Distance (Feet): 120  Feet Assistive device: Rolling walker (2 wheels) Gait Pattern/deviations: Step-through pattern, Trunk flexed       General Gait Details: Noted pt initiated pursed lip breathing without the need for cues to do so; RW slightly ahead of pt, made slight correction with cues  Stairs            Wheelchair Mobility     Tilt Bed    Modified Rankin (Stroke Patients Only)       Balance Overall balance assessment: Needs assistance             Standing balance comment: Benefits from UE support in standing                             Pertinent Vitals/Pain Pain Assessment Pain Assessment: No/denies pain    Home Living Family/patient expects to be discharged to:: Private residence Living Arrangements: Non-relatives/Friends Available Help at Discharge: Friend(s) Type of Home: House Home Access: Stairs to enter Entrance Stairs-Rails: None Entrance Stairs-Number of Steps: 2   Home Layout: One level Home Equipment: Cane - single Librarian, academic (2 wheels) Additional Comments: Roommate assists with bathing and dressing due to RA and joint pain    Prior Function Prior Level of Function : Needs assist             Mobility Comments: REports no difficulty walking household distances ADLs Comments: Roommate assists with aDLs     Hand Dominance        Extremity/Trunk Assessment  Upper Extremity Assessment Upper Extremity Assessment: Overall WFL for tasks assessed    Lower Extremity Assessment Lower Extremity Assessment: Generalized weakness       Communication   Communication: No difficulties  Cognition Arousal/Alertness: Awake/alert Behavior During Therapy: WFL for tasks assessed/performed Overall Cognitive Status: Within Functional Limits for tasks assessed                                          General Comments General comments (skin integrity, edema, etc.): See other PT note of this date fro O2 sat walk     Exercises     Assessment/Plan    PT Assessment All further PT needs can be met in the next venue of care  PT Problem List Decreased strength;Decreased activity tolerance;Decreased balance;Decreased mobility;Decreased knowledge of use of DME;Cardiopulmonary status limiting activity       PT Treatment Interventions      PT Goals (Current goals can be found in the Care Plan section)  Acute Rehab PT Goals Patient Stated Goal: Home today PT Goal Formulation: All assessment and education complete, DC therapy    Frequency       Co-evaluation               AM-PAC PT "6 Clicks" Mobility  Outcome Measure Help needed turning from your back to your side while in a flat bed without using bedrails?: A Little Help needed moving from lying on your back to sitting on the side of a flat bed without using bedrails?: A Little Help needed moving to and from a bed to a chair (including a wheelchair)?: A Little Help needed standing up from a chair using your arms (e.g., wheelchair or bedside chair)?: A Little Help needed to walk in hospital room?: A Little Help needed climbing 3-5 steps with a railing? : A Little 6 Click Score: 18    End of Session Equipment Utilized During Treatment: Gait belt Activity Tolerance: Patient tolerated treatment well Patient left: in chair;with call bell/phone within reach;with family/visitor present Nurse Communication: Mobility status;Other (comment) (O2 sat walk done) PT Visit Diagnosis: Muscle weakness (generalized) (M62.81)    Time: 1416-1440 PT Time Calculation (min) (ACUTE ONLY): 24 min   Charges:   PT Evaluation $PT Eval Low Complexity: 1 Low PT Treatments $Gait Training: 8-22 mins PT General Charges $$ ACUTE PT VISIT: 1 Visit         Van Clines, PT  Acute Rehabilitation Services Office [phone] Secure Chat welcomed   Levi Aland 01/10/2023, 4:44 PM

## 2023-01-10 NOTE — Discharge Summary (Signed)
Physician Discharge Summary   Patient: Brian [name] MRN: [id number] DOB: 05-08-1951  Admit date:     01/07/2023  Discharge date: 01/10/23  Discharge Physician: Vassie Loll   PCP: Joycelyn Rua, MD   Recommendations at discharge:  Repeat basic metabolic panel to follow electrolytes and renal function trend/stability. Reassessed blood pressure and adjust antihypertensive treatment as needed Repeat CBC to follow hemoglobin trend/stability Continue assisting patient with weight management.  Discharge Diagnoses: Principal Problem:   Acute on chronic diastolic CHF (congestive heart failure) (HCC) Active Problems:   Essential hypertension   Persistent atrial fibrillation (HCC)   History of TIA (transient ischemic attack)   COPD (chronic obstructive pulmonary disease) (HCC)   Rheumatoid arthritis (HCC)   Iron deficiency anemia   Class 2 obesity  Hospital Course: Brian [name] was admitted to the hospital with the working diagnosis of acute hypoxemic respiratory failure.   72 yo male with the past medical history of paroxysmal atrial fibrillation, COPD, heart failure, rheumatoid arthritis, and chronic anemia who presented to the hospital for elective atrial fibrillation ablation. Procedure was cancelled due to hypoxemia. Patient reported 2 to 3 weeks of worsening dyspnea with wheezing, refractive to bronchodilator therapy and inhaled corticosteroids. Positive lower extremity edema. On his initial physical examination his blood pressure was 135/64, HR 106, RR 24 and 02 saturation 84%, lungs with decreased breath sounds bilaterally and diffuse wheezing, increased work of breathing, heart with S1 and S2 present, irregularly irregular with no gallops or rubs, abdomen with no distention, positive lower extremity edema, pitting +++.   Na 137, K 4,7 Cl 99, bicarbonate 30 glucose 180 bun 18 cr 0,87  Mg 2,0  Wbc 9.9 hgb 7.3 hct 29,0 plt 318   Chest radiograph with cardiomegaly, bilateral  hilar vascular congestion, bilateral central interstitial infiltrates.   Assessment and Plan: * Acute on chronic diastolic CHF (congestive heart failure) (HCC) Echocardiogram with mild reduction in systolic function to 45 to 50%, moderate LVH, RV systolic function with moderate reduction, RV size with mild enlargement, RVSP 45.5 mmHg. LA with mild dilatation, RA with moderate dilatation, moderate tricuspid regurgitation.  Patient with complaining of acute on chronic core pulmonale secondary to Pulmonary hypertension.   Excellent response to IV diuresis; good urine output and stabilization in his volume.  Feeling ready to go home.  Will discharge home on adjusted dose of Lasix (40 mg twice a day) and initiation of spironolactone -Will recommend follow-up with patient electrolytes/renal function with decision to initiate ARB and SGLT2 inhibitors at follow-up visit.  Patient instructed to be compliant with daily weights, low-sodium diet and adequate hydration.  Essential hypertension Stable and well-controlled -Continue current antihypertensive regimen -Heart healthy diet discussed with patient.  Persistent atrial fibrillation (HCC) Currently rate controlled and is stable..  Plan to continue amiodarone and diltiazem. Continue Xarelto for anticoagulation purposes.  History of TIA (transient ischemic attack) Continue anticoagulation for atrial fibrillation.  Patient not on statin. No acute complaints currently. Continue risk factor modification.  COPD (chronic obstructive pulmonary disease) (HCC) Good response to cystectomy corticosteroids and prednisone -Continue Dulera and Xopenex at time of discharge -Continue follow-up with PCP.  Rheumatoid arthritis (HCC) Continue outpatient follow-up with rheumatology service -Continue the use of Plaquenil -Currently holding methotrexate in the setting of acute on chronic anemia -Patient normally uses methotrexate once a week.    Iron  deficiency anemia Serum iron 103, TIBC 501, transferrin saturation 21 and ferritin 9.  Stable hemoglobin at the moment without signs of overt bleeding -Continue  to follow trend; continue as needed transfusion  Class 2 obesity Calculated BMI 39,7  Low-calorie diet, portion control and increase physical activity discussed with patient.  Consultants: None Procedures performed: See below for x-ray reports Disposition: Home Diet recommendation: Heart healthy/low-sodium diet.  DISCHARGE MEDICATION: Allergies as of 01/10/2023       Reactions   Aquacel-ag Surgical Hydrofiber [wound Dressings] Other (See Comments)   Tears skin   Tape Rash, Other (See Comments)   Paper tape is preferred, please!! Adhesive tape tears skin        Medication List     STOP taking these medications    Slow Iron 160 (50 Fe) MG Tbcr SR tablet Generic drug: ferrous sulfate       TAKE these medications    acetaminophen 500 MG tablet Commonly known as: TYLENOL Take 500 mg by mouth every 6 (six) hours as needed for headache.   amiodarone 200 MG tablet Commonly known as: PACERONE Take 1 tablet (200 mg total) by mouth daily. Please keep upcoming appt.with Cardiologist for future refills. Thank You.   budesonide-formoterol 160-4.5 MCG/ACT inhaler Commonly known as: Symbicort Inhale 2 puffs into the lungs 2 (two) times daily.   diltiazem 120 MG 24 hr capsule Commonly known as: CARDIZEM CD TAKE 1 CAPSULE BY MOUTH EVERY DAY   folic acid 1 MG tablet Commonly known as: FOLVITE Take 1 mg by mouth daily.   furosemide 40 MG tablet Commonly known as: LASIX Take 1 tablet (40 mg total) by mouth 2 (two) times daily. What changed: when to take this   hydroxychloroquine 200 MG tablet Commonly known as: PLAQUENIL Take 1 tablet (200 mg total) by mouth 2 (two) times daily.   Klor-Con M20 20 MEQ tablet Generic drug: potassium chloride SA TAKE 1 TABLET BY MOUTH EVERY DAY What changed: how much to take    levalbuterol 45 MCG/ACT inhaler Commonly known as: XOPENEX HFA Inhale 1-2 puffs into the lungs every 4 (four) hours as needed for wheezing or shortness of breath.   methotrexate 2.5 MG tablet Commonly known as: RHEUMATREX Take 25 mg by mouth every Friday.   pantoprazole 40 MG tablet Commonly known as: Protonix Take 1 tablet (40 mg total) by mouth daily.   polyethylene glycol 17 g packet Commonly known as: MIRALAX / GLYCOLAX Take 17 g by mouth daily. Continue daily while on iron. What changed:  when to take this reasons to take this additional instructions   rivaroxaban 20 MG Tabs tablet Commonly known as: Xarelto Take 1 tablet (20 mg total) by mouth daily with supper.   Simponi Aria 50 MG/4ML Soln injection Generic drug: golimumab Inject 50 mg into the vein every 8 (eight) weeks.   spironolactone 25 MG tablet Commonly known as: ALDACTONE Take 0.5 tablets (12.5 mg total) by mouth daily. Start taking on: January 11, 2023   tamsulosin 0.4 MG Caps capsule Commonly known as: FLOMAX Take 0.4 mg by mouth at bedtime.        Follow-up Information     Joycelyn Rua, MD. Schedule an appointment as soon as possible for a visit in 10 day(s).   Specialty: Family Medicine Contact information: [address] [place] [address] [phone]                Discharge Exam: Ceasar Mons Weights   01/08/23 0330 01/09/23 0332 01/10/23 0444  Weight: 123.8 kg 123.9 kg 123.7 kg   General exam: Alert, awake, oriented x 3, in no acute distress; reporting  breathing back to baseline and feeling ready for discharge.  No oxygen requirement appreciated. Respiratory system: Decreased breath sounds at the bases; no wheezing, no frank crackles no using accessory muscles.  Able to speak in full sentences. Cardiovascular system: Rate controlled; no rubs, no gallops, no JVD. Gastrointestinal system: Abdomen is obese, nondistended, soft and nontender. No organomegaly or masses felt.  Normal bowel sounds heard. Central nervous system: Alert and oriented. No focal neurological deficits. Extremities: No cyanosis or clubbing; 1+ edema appreciated bilaterally.  Dialysis in place. Skin: No petechiae. Psychiatry: Judgement and insight appear normal. Mood & affect appropriate.    Condition at discharge: Stable and improved.  The results of significant diagnostics from this hospitalization (including imaging, microbiology, ancillary and laboratory) are listed below for reference.   Imaging Studies: ECHOCARDIOGRAM COMPLETE  Result Date: 01/09/2023    ECHOCARDIOGRAM REPORT   Patient Name:   DAYVON LARUSSA Date of Exam: 01/09/2023 Medical Rec #:  [id number]        Height:       69.5 in Accession #:    [id number]       Weight:       273.2 lb Date of Birth:  1950/10/29        BSA:          2.371 m Patient Age:    71 years         BP:           125/77 mmHg Patient Gender: M                HR:           91 bpm. Exam Location:  Inpatient Procedure: 2D Echo, Color Doppler and Cardiac Doppler Indications:    R06.02 SOB  History:        Patient has prior history of Echocardiogram examinations. CHF,                 Abnormal ECG, TIA and COPD, Arrythmias:Atrial Fibrillation; Risk                 Factors:Current Smoker.  Sonographer:    Sheralyn Boatman RDCS Referring Phys: [id number] [facility]  Sonographer Comments: Technically difficult study due to poor echo windows. Image acquisition challenging due to uncooperative patient. Exam eneded at apical 2 chamber per patient request. Patient could not tolerate exam. Patient said he was too "fidgety". Exam also paused for pateint care. IMPRESSIONS  1. Left ventricular ejection fraction, by estimation, is 45 to 50%. The left ventricle has mildly decreased function. The left ventricle has no regional wall motion abnormalities. There is moderate concentric left ventricular hypertrophy. Left ventricular diastolic function could not be evaluated.  2. Right  ventricular systolic function is moderately reduced. The right ventricular size is mildly enlarged. There is moderately elevated pulmonary artery systolic pressure. The estimated right ventricular systolic pressure is 45.5 mmHg.  3. Left atrial size was mildly dilated.  4. Right atrial size was moderately dilated.  5. The mitral valve is normal in structure. Mild mitral valve regurgitation. No evidence of mitral stenosis.  6. Tricuspid valve regurgitation is moderate.  7. The aortic valve is tricuspid. There is mild calcification of the aortic valve. Aortic valve regurgitation is not visualized. No aortic stenosis is present.  8. The inferior vena cava is normal in size with greater than 50% respiratory variability, suggesting right atrial pressure of 3 mmHg. FINDINGS  Left Ventricle: Left ventricular ejection fraction, by estimation, is 45  to 50%. The left ventricle has mildly decreased function. The left ventricle has no regional wall motion abnormalities. The left ventricular internal cavity size was normal in size. There is moderate concentric left ventricular hypertrophy. Left ventricular diastolic function could not be evaluated due to atrial fibrillation. Left ventricular diastolic function could not be evaluated. Right Ventricle: The right ventricular size is mildly enlarged. No increase in right ventricular wall thickness. Right ventricular systolic function is moderately reduced. There is moderately elevated pulmonary artery systolic pressure. The tricuspid regurgitant velocity is 2.98 m/s, and with an assumed right atrial pressure of 10 mmHg, the estimated right ventricular systolic pressure is 45.5 mmHg. Left Atrium: Left atrial size was mildly dilated. Right Atrium: Right atrial size was moderately dilated. Pericardium: There is no evidence of pericardial effusion. Mitral Valve: The mitral valve is normal in structure. Mild mitral valve regurgitation. No evidence of mitral valve stenosis. MV peak  gradient, 9.2 mmHg. The mean mitral valve gradient is 5.0 mmHg. Tricuspid Valve: The tricuspid valve is normal in structure. Tricuspid valve regurgitation is moderate . No evidence of tricuspid stenosis. Aortic Valve: The aortic valve is tricuspid. There is mild calcification of the aortic valve. Aortic valve regurgitation is not visualized. No aortic stenosis is present. Pulmonic Valve: The pulmonic valve was normal in structure. Pulmonic valve regurgitation is trivial. No evidence of pulmonic stenosis. Aorta: The aortic root is normal in size and structure. Venous: The inferior vena cava is normal in size with greater than 50% respiratory variability, suggesting right atrial pressure of 3 mmHg. IAS/Shunts: No atrial level shunt detected by color flow Doppler.  LEFT VENTRICLE PLAX 2D LVIDd:         4.30 cm LVIDs:         3.30 cm LV PW:         1.50 cm LV IVS:        1.60 cm LVOT diam:     2.20 cm LV SV:         95 LV SV Index:   40 LVOT Area:     3.80 cm  LV Volumes (MOD) LV vol d, MOD A2C: 109.0 ml LV vol d, MOD A4C: 107.0 ml LV vol s, MOD A2C: 36.5 ml LV vol s, MOD A4C: 58.6 ml LV SV MOD A2C:     72.5 ml LV SV MOD A4C:     107.0 ml LV SV MOD BP:      62.9 ml RIGHT VENTRICLE RV S prime:     9.68 cm/s TAPSE (M-mode): 1.4 cm LEFT ATRIUM             Index        RIGHT ATRIUM           Index LA diam:        4.40 cm 1.86 cm/m   RA Area:     20.40 cm LA Vol (A2C):   88.8 ml 37.46 ml/m  RA Volume:   55.20 ml  23.29 ml/m LA Vol (A4C):   33.1 ml 13.96 ml/m LA Biplane Vol: 57.2 ml 24.13 ml/m  AORTIC VALVE LVOT Vmax:   142.00 cm/s LVOT Vmean:  93.250 cm/s LVOT VTI:    0.250 m  AORTA Ao Root diam: 3.40 cm Ao Asc diam:  3.40 cm MITRAL VALVE                TRICUSPID VALVE MV Area (PHT): 3.42 cm     TR Peak grad:   35.5 mmHg MV  Area VTI:   3.07 cm     TR Vmax:        298.00 cm/s MV Peak grad:  9.2 mmHg MV Mean grad:  5.0 mmHg     SHUNTS MV Vmax:       1.52 m/s     Systemic VTI:  0.25 m MV Vmean:      105.0 cm/s    Systemic Diam: 2.20 cm MV Decel Time: 222 msec MV E velocity: 130.00 cm/s Arvilla Meres MD Electronically signed by Arvilla Meres MD Signature Date/Time: 01/09/2023/10:31:03 AM    Final    CT CARDIAC MORPH/PULM VEIN W/CM&W/O CA SCORE  Addendum Date: 01/08/2023   ADDENDUM REPORT: 01/08/2023 15:53 EXAM: OVER-READ INTERPRETATION  CT CHEST The following report is an over-read performed by radiologist Dr. Elnoria Howard [facility] on 01/08/2023. This over-read does not include interpretation of cardiac or coronary anatomy or pathology. The cardiovascular interpretation by the cardiologist is attached. COMPARISON:  Chest CT dated 11/19/2022. Cardiac CTA dated 07/14/2022. FINDINGS: Small to moderate-sized right pleural effusion with mild adjacent compressive atelectasis without significant change. Decreased left pleural fluid with a small residual amount of pleural fluid along the inferior major fissure on the left. Mild left basilar linear atelectasis/scarring, with improvement. The previously demonstrated 6 mm left lower lobe nodule currently measures 4 mm on image number 53/8. A 3 mm nodule in the right lower lobe on image number 41/8 is unchanged. No new or enlarging nodules are seen. No enlarged lymph nodes. Unremarkable distal esophagus and upper abdomen. Mild thoracic spine degenerative changes. IMPRESSION: 1. Small to moderate-sized right pleural effusion with mild adjacent compressive atelectasis without significant change. 2. Decreased left pleural fluid with a small residual amount of pleural fluid along the inferior major fissure on the left. 3. Decreased size of a 6 mm left lower lobe nodule, now measuring 4 mm. This is now the same size as on 07/14/2022 and a 2nd 4 mm nodule seen at that time in the left lower lobe is no longer visualized. 4. Stable 3 mm right lower lobe nodule. The recommendations from 07/14/2022 are unchanged. Electronically Signed   By: Beckie Salts M.D.   On:  01/08/2023 15:53   Result Date: 01/08/2023 CLINICAL DATA:  Atrial fibrillation scheduled for left atrial appendage closure. EXAM: Cardiac CT/CTA TECHNIQUE: A non-contrast, gated CT scan was obtained with axial slices of 3 mm through the heart for calcium scoring. Calcium scoring was performed using the Agatston method. A 120 kV prospective, gated, contrast cardiac scan was obtained. Gantry rotation speed was 250 msecs and collimation was 0.6 mm. Nitroglycerin was not given. A delayed scan was obtained to exclude left atrial appendage thrombus. The 3D dataset was reconstructed in 5% intervals of the 25-50% of the R-R cycle. Late systolic phases were analyzed on a dedicated workstation using MPR, MIP, and VRT modes. The patient received 80 cc of contrast. FINDINGS: Image quality: excellent. Noise artifact is: Limited. Left Atrium: The left atrial size is dilated. There is no PFO/ASD. There is normal pulmonary vein drainage into the left atrium (2 on the right and 2 on the left). Left Atrial Appendage: Morphology: The left atrial appendage is large broccoli type with two lobes. Thrombus: There is no thrombus in the left atrial appendage on contrast or delayed imaging. The following measurements were made regarding left atrial appendage closure: Phase assessed: 35% Landing Zone measurement: 33.0 mm x 23.4 mm LAA Length (maximum): 25.1 mm Optimal interatrial septum puncture site: Mid/mid  Optimal deployment angle: LAO 8 CRA 8 Catheter: A double curve catheter is recommended. Watchman FLX Device: A 40 mm device is recommended with 18% compression. Other comments: None. Coronary Arteries: CAC score of 368, which is 65th percentile for age-, sex-, and race-matched controls. Normal coronary origin. Right dominance. The study was performed without use of NTG and is insufficient for plaque evaluation. Right Atrium: Right atrial size is dilated. Right Ventricle: The right ventricular cavity is within normal limits. Left  Ventricle: The ventricular cavity size is within normal limits. Pulmonary Artery: Dilated pulmonary artery suggestive of pulmonary hypertension. Cardiac valves: The aortic valve is trileaflet without significant calcification. The mitral valve is normal structure without significant calcification. Aorta: Normal caliber with no significant disease. Pericardium: Normal thickness with no significant effusion or calcium present. Extra-cardiac findings: See attached radiology report for non-cardiac structures. IMPRESSION: 1. The left atrial appendage is a large broccoli morphology without thrombus. 2. A 40 mm Watchman FLX device is recommended based on the above landing zone measurements (33.0 mm maximum diameter; 18% compression). 3. A mid/mid IAS puncture site is recommended. 4. Optimal deployment angle: LAO 8 CRA 8 5. There is normal pulmonary vein drainage into the left atrium (2 on the right and 2 on the left). 6. CAC score of 368, which is 65th percentile for age-, sex-, and race-matched controls. Gerri Spore T. Flora Lipps, MD Electronically Signed: By: Lennie Odor M.D. On: 01/04/2023 14:13   DG CHEST PORT 1 VIEW  Result Date: 01/07/2023 CLINICAL DATA:  COPD EXAM: PORTABLE CHEST 1 VIEW COMPARISON:  12/19/2022 FINDINGS: Stable cardiomegaly. Aortic atherosclerosis. Mild diffuse interstitial prominence. Possible small bilateral pleural effusions. No pneumothorax. IMPRESSION: Cardiomegaly with mild diffuse interstitial prominence and possible small bilateral pleural effusions. Findings may reflect mild pulmonary edema. Electronically Signed   By: Duanne Guess D.O.   On: 01/07/2023 17:34   DG Chest Port 1 View  Result Date: 12/19/2022 CLINICAL DATA:  CHF SOB EXAM: PORTABLE CHEST 1 VIEW COMPARISON:  CT Chest 11/19/22 FINDINGS: No large pleural effusion. No pneumothorax. Cardiomegaly. No focal airspace opacity. No radiographically apparent displaced rib fractures. Visualized upper abdomen is unremarkable. Prominent  bilateral interstitial opacities could represent mild pulmonary venous congestion. IMPRESSION: Cardiomegaly with possible mild pulmonary venous congestion. Electronically Signed   By: Lorenza Cambridge M.D.   On: 12/19/2022 13:17    Microbiology: Results for orders placed or performed during the hospital encounter of 10/15/22  Resp panel by RT-PCR (RSV, Flu A&B, Covid) Anterior Nasal Swab     Status: None   Collection Time: 10/15/22  4:21 PM   Specimen: Anterior Nasal Swab  Result Value Ref Range Status   SARS Coronavirus 2 by RT PCR NEGATIVE NEGATIVE Final   Influenza A by PCR NEGATIVE NEGATIVE Final   Influenza B by PCR NEGATIVE NEGATIVE Final    Comment: (NOTE) The Xpert Xpress SARS-CoV-2/FLU/RSV plus assay is intended as an aid in the diagnosis of influenza from Nasopharyngeal swab specimens and should not be used as a sole basis for treatment. Nasal washings and aspirates are unacceptable for Xpert Xpress SARS-CoV-2/FLU/RSV testing.  Fact Sheet for Patients: BloggerCourse.com  Fact Sheet for Healthcare Providers: SeriousBroker.it  This test is not yet approved or cleared by the [place] FDA and has been authorized for detection and/or diagnosis of SARS-CoV-2 by FDA under an Emergency Use Authorization (EUA). This EUA will remain in effect (meaning this test can be used) for the duration of the COVID-19 declaration under Section 564(b)(1) of the Act,  21 U.S.C. section 360bbb-3(b)(1), unless the authorization is terminated or revoked.     Resp Syncytial Virus by PCR NEGATIVE NEGATIVE Final    Comment: (NOTE) Fact Sheet for Patients: BloggerCourse.com  Fact Sheet for Healthcare Providers: SeriousBroker.it  This test is not yet approved or cleared by the [place] FDA and has been authorized for detection and/or diagnosis of SARS-CoV-2 by FDA under an Emergency Use  Authorization (EUA). This EUA will remain in effect (meaning this test can be used) for the duration of the COVID-19 declaration under Section 564(b)(1) of the Act, 21 U.S.C. section 360bbb-3(b)(1), unless the authorization is terminated or revoked.  Performed at [facility], [address] [place], [place] [address]   Respiratory (~20 pathogens) panel by PCR     Status: Abnormal   Collection Time: 10/16/22  7:49 AM   Specimen: Nasopharyngeal Swab; Respiratory  Result Value Ref Range Status   Adenovirus NOT DETECTED NOT DETECTED Final   Coronavirus 229E NOT DETECTED NOT DETECTED Final    Comment: (NOTE) The Coronavirus on the Respiratory Panel, DOES NOT test for the novel  Coronavirus (2019 nCoV)    Coronavirus HKU1 NOT DETECTED NOT DETECTED Final   Coronavirus NL63 NOT DETECTED NOT DETECTED Final   Coronavirus OC43 NOT DETECTED NOT DETECTED Final   Metapneumovirus NOT DETECTED NOT DETECTED Final   Rhinovirus / Enterovirus NOT DETECTED NOT DETECTED Final   Influenza A NOT DETECTED NOT DETECTED Final   Influenza B NOT DETECTED NOT DETECTED Final   Parainfluenza Virus 1 NOT DETECTED NOT DETECTED Final   Parainfluenza Virus 2 NOT DETECTED NOT DETECTED Final   Parainfluenza Virus 3 DETECTED (A) NOT DETECTED Final   Parainfluenza Virus 4 NOT DETECTED NOT DETECTED Final   Respiratory Syncytial Virus NOT DETECTED NOT DETECTED Final   Bordetella pertussis NOT DETECTED NOT DETECTED Final   Bordetella Parapertussis NOT DETECTED NOT DETECTED Final   Chlamydophila pneumoniae NOT DETECTED NOT DETECTED Final   Mycoplasma pneumoniae NOT DETECTED NOT DETECTED Final    Comment: Performed at [facility], [address] [place], [place] [address]    Labs: CBC: Recent Labs  Lab 01/04/23 1004 01/07/23 1230 01/08/23 0326 01/10/23 0156  WBC 11.8* 13.0* 9.9 12.3*  NEUTROABS 9.3*  --   --   --   HGB 7.5* 7.5* 7.3* 7.1*  HCT 29.3* 29.8* 29.0* 27.1*  MCV 78* 81.2 81.0 80.4   PLT 359 349 318 311   Basic Metabolic Panel: Recent Labs  Lab 01/08/23 0326 01/09/23 0216 01/10/23 0156  NA 137 137 136  K 4.7 3.8 3.5  CL 99 95* 94*  CO2 30 32 34*  GLUCOSE 180* 135* 110*  BUN 18 21 23   CREATININE 0.87 1.01 0.99  CALCIUM 8.3* 8.4* 8.4*  MG 2.0 2.0 1.9   Discharge time spent: greater than 30 minutes.  Signed: Vassie Loll, MD [facility] 01/10/2023

## 2023-01-18 ENCOUNTER — Emergency Department (HOSPITAL_COMMUNITY): Payer: Medicare Other

## 2023-01-18 ENCOUNTER — Encounter (HOSPITAL_COMMUNITY): Payer: Self-pay

## 2023-01-18 ENCOUNTER — Other Ambulatory Visit: Payer: Self-pay

## 2023-01-18 ENCOUNTER — Emergency Department (HOSPITAL_COMMUNITY)
Admission: EM | Admit: 2023-01-18 | Discharge: 2023-01-19 | Disposition: A | Discharge status: Other Acute Inpatient Hospital | Payer: Medicare Other | Attending: Emergency Medicine | Admitting: Emergency Medicine

## 2023-01-18 DIAGNOSIS — I110 Hypertensive heart disease with heart failure: Secondary | ICD-10-CM | POA: Diagnosis not present

## 2023-01-18 DIAGNOSIS — R062 Wheezing: Secondary | ICD-10-CM | POA: Diagnosis not present

## 2023-01-18 DIAGNOSIS — J984 Other disorders of lung: Secondary | ICD-10-CM | POA: Diagnosis not present

## 2023-01-18 DIAGNOSIS — R531 Weakness: Secondary | ICD-10-CM | POA: Diagnosis not present

## 2023-01-18 DIAGNOSIS — J90 Pleural effusion, not elsewhere classified: Secondary | ICD-10-CM | POA: Diagnosis not present

## 2023-01-18 DIAGNOSIS — R0600 Dyspnea, unspecified: Secondary | ICD-10-CM | POA: Insufficient documentation

## 2023-01-18 DIAGNOSIS — R918 Other nonspecific abnormal finding of lung field: Secondary | ICD-10-CM | POA: Diagnosis not present

## 2023-01-18 DIAGNOSIS — J9811 Atelectasis: Secondary | ICD-10-CM | POA: Diagnosis not present

## 2023-01-18 DIAGNOSIS — R0902 Hypoxemia: Secondary | ICD-10-CM | POA: Insufficient documentation

## 2023-01-18 DIAGNOSIS — R0989 Other specified symptoms and signs involving the circulatory and respiratory systems: Secondary | ICD-10-CM | POA: Diagnosis not present

## 2023-01-18 DIAGNOSIS — R0602 Shortness of breath: Secondary | ICD-10-CM | POA: Diagnosis not present

## 2023-01-18 DIAGNOSIS — I509 Heart failure, unspecified: Secondary | ICD-10-CM | POA: Insufficient documentation

## 2023-01-18 DIAGNOSIS — Z1152 Encounter for screening for COVID-19: Secondary | ICD-10-CM | POA: Insufficient documentation

## 2023-01-18 DIAGNOSIS — I517 Cardiomegaly: Secondary | ICD-10-CM | POA: Diagnosis not present

## 2023-01-18 LAB — HEPATIC FUNCTION PANEL
ALT: 40 U/L (ref 0–44)
AST: 58 U/L — ABNORMAL HIGH (ref 15–41)
Albumin: 2.9 g/dL — ABNORMAL LOW (ref 3.5–5.0)
Alkaline Phosphatase: 76 U/L (ref 38–126)
Bilirubin, Direct: 0.4 mg/dL — ABNORMAL HIGH (ref 0.0–0.2)
Indirect Bilirubin: 0.7 mg/dL (ref 0.3–0.9)
Total Bilirubin: 1.1 mg/dL (ref 0.3–1.2)
Total Protein: 7.2 g/dL (ref 6.5–8.1)

## 2023-01-18 LAB — CBC
HCT: 28.2 % — ABNORMAL LOW (ref 39.0–52.0)
Hemoglobin: 7.1 g/dL — ABNORMAL LOW (ref 13.0–17.0)
MCH: 20.9 pg — ABNORMAL LOW (ref 26.0–34.0)
MCHC: 25.2 g/dL — ABNORMAL LOW (ref 30.0–36.0)
MCV: 83.2 fL (ref 80.0–100.0)
Platelets: 259 10*3/uL (ref 150–400)
RBC: 3.39 MIL/uL — ABNORMAL LOW (ref 4.22–5.81)
RDW: 20.6 % — ABNORMAL HIGH (ref 11.5–15.5)
WBC: 10.4 10*3/uL (ref 4.0–10.5)
nRBC: 0.2 % (ref 0.0–0.2)

## 2023-01-18 LAB — BASIC METABOLIC PANEL
Anion gap: 10 (ref 5–15)
BUN: 24 mg/dL — ABNORMAL HIGH (ref 8–23)
CO2: 28 mmol/L (ref 22–32)
Calcium: 8.1 mg/dL — ABNORMAL LOW (ref 8.9–10.3)
Chloride: 98 mmol/L (ref 98–111)
Creatinine, Ser: 1.2 mg/dL (ref 0.61–1.24)
GFR, Estimated: >60 mL/min (ref >60)
Glucose, Bld: 117 mg/dL — ABNORMAL HIGH (ref 70–99)
Potassium: 5.1 mmol/L (ref 3.5–5.1)
Sodium: 136 mmol/L (ref 135–145)

## 2023-01-18 LAB — TROPONIN I (HIGH SENSITIVITY)
Troponin I (High Sensitivity): 14 ng/L (ref <18)
Troponin I (High Sensitivity): 16 ng/L (ref <18)

## 2023-01-18 LAB — PROTIME-INR
INR: 2.7 — ABNORMAL HIGH (ref 0.8–1.2)
Prothrombin Time: 28.9 seconds — ABNORMAL HIGH (ref 11.4–15.2)

## 2023-01-18 LAB — SARS CORONAVIRUS 2 BY RT PCR: SARS Coronavirus 2 by RT PCR: NEGATIVE

## 2023-01-18 LAB — MAGNESIUM: Magnesium: 2 mg/dL (ref 1.7–2.4)

## 2023-01-18 LAB — BRAIN NATRIURETIC PEPTIDE: B Natriuretic Peptide: 367.1 pg/mL — ABNORMAL HIGH (ref 0.0–100.0)

## 2023-01-18 MED ORDER — IOHEXOL 350 MG/ML SOLN
75.0000 mL | Freq: Once | INTRAVENOUS | Status: AC | PRN
  Administered 2023-01-18: 75 mL via INTRAVENOUS

## 2023-01-18 MED ORDER — FUROSEMIDE 10 MG/ML IJ SOLN
40.0000 mg | Freq: Once | INTRAMUSCULAR | Status: AC
  Administered 2023-01-18: 40 mg via INTRAVENOUS
  Filled 2023-01-18: qty 4

## 2023-01-18 MED ORDER — FENTANYL CITRATE PF 50 MCG/ML IJ SOSY
50.0000 ug | PREFILLED_SYRINGE | Freq: Once | INTRAMUSCULAR | Status: AC
  Administered 2023-01-18: 50 ug via INTRAVENOUS
  Filled 2023-01-18: qty 1

## 2023-01-18 NOTE — ED Provider Notes (Signed)
  Physical Exam  BP (!) 105/57 (BP Location: Left Arm)   Pulse 85   Temp 98.2 F (36.8 C) (Oral)   Resp (!) 22   Ht 5' 9.5" (1.765 m)   Wt 117.9 kg   SpO2 100%   BMI 37.84 kg/m   Physical Exam Constitutional:      General: He is not in acute distress.    Appearance: Normal appearance.  HENT:     Head: Normocephalic and atraumatic.     Nose: No congestion or rhinorrhea.  Eyes:     General:        Right eye: No discharge.        Left eye: No discharge.     Extraocular Movements: Extraocular movements intact.     Pupils: Pupils are equal, round, and reactive to light.  Cardiovascular:     Rate and Rhythm: Normal rate and regular rhythm.     Heart sounds: No murmur heard. Pulmonary:     Effort: No respiratory distress.     Breath sounds: Rales present. No wheezing.  Abdominal:     General: There is no distension.     Tenderness: There is no abdominal tenderness.  Musculoskeletal:        General: Normal range of motion.     Cervical back: Normal range of motion.  Skin:    General: Skin is warm and dry.  Neurological:     General: No focal deficit present.     Mental Status: He is alert.     Procedures  .Critical Care  Performed by: Glendora Score, MD Authorized by: Glendora Score, MD   Critical care provider statement:    Critical care time (minutes):  30   Critical care was necessary to treat or prevent imminent or life-threatening deterioration of the following conditions:  Respiratory failure   Critical care was time spent personally by me on the following activities:  Development of treatment plan with patient or surrogate, discussions with consultants, evaluation of patient's response to treatment, examination of patient, ordering and review of laboratory studies, ordering and review of radiographic studies, ordering and performing treatments and interventions, pulse oximetry, re-evaluation of patient's condition and review of old charts Ultrasound ED  Peripheral IV (Provider)  Date/Time: 01/18/2023 10:10 PM  Performed by: Glendora Score, MD Authorized by: Glendora Score, MD   Procedure details:    Indications: multiple failed IV attempts     Skin Prep: chlorhexidine gluconate     Location:  Right AC   Angiocath:  20 G   Bedside Ultrasound Guided: Yes     Images: not archived     Patient tolerated procedure without complications: Yes     Dressing applied: Yes     ED Course / MDM    Medical Decision Making Amount and/or Complexity of Data Reviewed Labs: ordered. Radiology: ordered.  Risk Prescription drug management. Decision regarding hospitalization.   Patient received an handoff.  Shortness of breath with new oxygen requirement.  Clinically fluid overloaded.  Pending PE study at time of signout.  PE study concerning for large pleural effusion but reassuringly negative for PE.  Diuresis begun and patient require hospital admission for diuresis.       Glendora Score, MD 01/18/23 2217

## 2023-01-18 NOTE — Progress Notes (Signed)
Per unit RN, ER MD was able to establish PIV access.

## 2023-01-18 NOTE — ED Triage Notes (Signed)
Pt from home c.o increased sob and weakness since leaving the hospital a week ago but got worse today. Pt also reports a rash on his feet that spread to his legs and arms since receiving a blood transfusion during his admission. Pt was 90% on room air, 2L [place] applied, pt at 100%. Pt very deyspneic on exertion.

## 2023-01-18 NOTE — ED Provider Notes (Signed)
Emergency Department Provider Note   I have reviewed the triage vital signs and the nursing notes.   HISTORY  Chief Complaint Shortness of Breath, Weakness, and Rash   HPI Brian [name] is a 72 y.o. male with PMH of a fib/flutter, CHF, and symptomatic anemia presents to the ED with increased SOB, worse with exertion, and petechial rash on the feet. Patient was admitted to [facility] after arriving for his cardiac ablation with hypoxemia. He required a blood transfusion and was ultimately discharged. Symptoms have progressively worsened since then. No BRBPR or melena. No fever. He developed a rash on the feet which has spread to the arms. Reports this occurred since his blood transfusion.    Past Medical History:  Diagnosis Date   Anemia    none since 2019   Anxiety    Atrial flutter (HCC)    a. 08/2012   CAP (community acquired pneumonia) 09/30/2016   Cellulitis of left leg 11/29/2016   CHF (congestive heart failure) (HCC)    chronic diastolic chf, pt denies   Community acquired pneumonia of left lower lobe of lung    GERD (gastroesophageal reflux disease)    Gout    pt denies   History of kidney stones    passed   Hypertension    Obesity    Paroxysmal atrial fibrillation (HCC)    Pneumonia 09/2016   Positive urine drug screen    Rheumatoid arthritis (HCC)    "a little bit qwhere" (07/22/2017)   Sepsis (HCC) 11/29/2016   Shortness of breath    with heavy  exertion   Tachypnea    Tobacco abuse     Review of Systems  Constitutional: No fever/chills Cardiovascular: Denies chest pain. Respiratory: Positive shortness of breath. Gastrointestinal: No abdominal pain.  No nausea, no vomiting.  No diarrhea.  No constipation. Genitourinary: Negative for dysuria. Musculoskeletal: Negative for back pain. Skin: Negative for rash. Neurological: Negative for headaches.  ____________________________________________   PHYSICAL EXAM:  VITAL SIGNS: ED Triage Vitals   Encounter Vitals Group     BP 01/18/23 1427 134/75     Pulse Rate 01/18/23 1427 97     Resp 01/18/23 1427 (!) 23     Temp 01/18/23 1427 97.9 F (36.6 C)     Temp Source 01/18/23 1427 Oral     SpO2 01/18/23 1427 97 %     Weight 01/18/23 1433 260 lb (117.9 kg)     Height 01/18/23 1433 5' 9.5" (1.765 m)   Constitutional: Alert and oriented. Well appearing and in no acute distress. Eyes: Conjunctivae are normal.  Head: Atraumatic. Nose: No congestion/rhinnorhea. Mouth/Throat: Mucous membranes are moist.   Neck: No stridor.  Cardiovascular: Normal rate, regular rhythm. Good peripheral circulation. Grossly normal heart sounds.   Respiratory: Normal respiratory effort.  No retractions. Lungs with crackles at the bases.  Gastrointestinal: Soft and nontender. No distention.  Musculoskeletal: No lower extremity tenderness nor edema. No gross deformities of extremities. Neurologic:  Normal speech and language. No gross focal neurologic deficits are appreciated.  Skin:  Skin is warm and dry. Petechial rash to the feet.   ____________________________________________   LABS (all labs ordered are listed, but only abnormal results are displayed)  Labs Reviewed  CBC - Abnormal; Notable for the following components:      Result Value   RBC 3.39 (*)    Hemoglobin 7.1 (*)    HCT 28.2 (*)    MCH 20.9 (*)    MCHC 25.2 (*)  RDW 20.6 (*)    All other components within normal limits  SARS CORONAVIRUS 2 BY RT PCR  BASIC METABOLIC PANEL  HEPATIC FUNCTION PANEL  BRAIN NATRIURETIC PEPTIDE  MAGNESIUM  TROPONIN I (HIGH SENSITIVITY)   ____________________________________________  EKG   EKG Interpretation Date/Time:  Monday January 18 2023 14:26:35 EDT Ventricular Rate:  101 PR Interval:    QRS Duration:  99 QT Interval:  359 QTC Calculation: 466 R Axis:   101  Text Interpretation: Atrial flutter with predominant 3:1 AV block Right axis deviation Low voltage, precordial leads Consider  anterior infarct Confirmed by Alona Bene [phone]) on 01/18/2023 2:32:24 PM        ____________________________________________  RADIOLOGY  No results found.  ____________________________________________   PROCEDURES  Procedure(s) performed:   Procedures   ____________________________________________   INITIAL IMPRESSION / ASSESSMENT AND PLAN / ED COURSE  Pertinent labs & imaging results that were available during my care of the patient were reviewed by me and considered in my medical decision making (see chart for details).   This patient is Presenting for Evaluation of SOB, which does require a range of treatment options, and is a complaint that involves a high risk of morbidity and mortality.  The Differential Diagnoses includes but is not exclusive to acute coronary syndrome, aortic dissection, pulmonary embolism, cardiac tamponade, community-acquired pneumonia, pericarditis, musculoskeletal chest wall pain, etc.   Critical Interventions-    Medications - No data to display  Reassessment after intervention:     I *** Additional Historical Information from ***, as the patient is ***.  I decided to review pertinent External Data, and in summary ***.   Clinical Laboratory Tests Ordered, included   Radiologic Tests Ordered, included ***. I independently interpreted the images and agree with radiology interpretation.   Cardiac Monitor Tracing which shows ***   Social Determinants of Health Risk ***  Consult complete with  Medical Decision Making: Summary: ***  Reevaluation with update and discussion with   ***Considered admission***  Patient's presentation is most consistent with acute presentation with potential threat to life or bodily function.   Disposition:   ____________________________________________  FINAL CLINICAL IMPRESSION(S) / ED DIAGNOSES  Final diagnoses:  None     NEW OUTPATIENT MEDICATIONS STARTED DURING THIS VISIT:  New  Prescriptions   No medications on file    Note:  This document was prepared using Dragon voice recognition software and may include unintentional dictation errors.  Alona Bene, MD, [facility] Emergency Medicine

## 2023-01-19 DIAGNOSIS — R0902 Hypoxemia: Secondary | ICD-10-CM | POA: Diagnosis not present

## 2023-01-19 DIAGNOSIS — Z1152 Encounter for screening for COVID-19: Secondary | ICD-10-CM | POA: Diagnosis not present

## 2023-01-19 DIAGNOSIS — R109 Unspecified abdominal pain: Secondary | ICD-10-CM | POA: Diagnosis not present

## 2023-01-19 DIAGNOSIS — I509 Heart failure, unspecified: Secondary | ICD-10-CM | POA: Diagnosis not present

## 2023-01-19 DIAGNOSIS — D692 Other nonthrombocytopenic purpura: Secondary | ICD-10-CM | POA: Diagnosis not present

## 2023-01-19 DIAGNOSIS — R0602 Shortness of breath: Secondary | ICD-10-CM | POA: Diagnosis present

## 2023-01-19 DIAGNOSIS — R0600 Dyspnea, unspecified: Secondary | ICD-10-CM | POA: Diagnosis not present

## 2023-01-19 DIAGNOSIS — J90 Pleural effusion, not elsewhere classified: Secondary | ICD-10-CM | POA: Diagnosis not present

## 2023-01-19 DIAGNOSIS — I110 Hypertensive heart disease with heart failure: Secondary | ICD-10-CM | POA: Diagnosis not present

## 2023-01-19 LAB — C-REACTIVE PROTEIN: CRP: 1.1 mg/dL — ABNORMAL HIGH (ref <1.0)

## 2023-01-19 LAB — PROCALCITONIN: Procalcitonin: <0.1 ng/mL

## 2023-01-19 LAB — SEDIMENTATION RATE: Sed Rate: 25 mm/hr — ABNORMAL HIGH (ref 0–16)

## 2023-01-19 MED ORDER — FENTANYL CITRATE PF 50 MCG/ML IJ SOSY
25.0000 ug | PREFILLED_SYRINGE | INTRAMUSCULAR | Status: DC | PRN
  Filled 2023-01-19: qty 1

## 2023-01-19 MED ORDER — SODIUM CHLORIDE 0.9 % IV SOLN
500.0000 mg | Freq: Once | INTRAVENOUS | Status: AC
  Administered 2023-01-19: 500 mg via INTRAVENOUS
  Filled 2023-01-19: qty 5

## 2023-01-19 MED ORDER — SODIUM CHLORIDE 0.9 % IV SOLN
2.0000 g | Freq: Once | INTRAVENOUS | Status: AC
  Administered 2023-01-19: 2 g via INTRAVENOUS
  Filled 2023-01-19: qty 20

## 2023-01-19 NOTE — ED Provider Notes (Signed)
12:29 AM Assumed care from Dr. Posey Rea, please see their note for full history, physical and decision making until this point. In brief this is a 72 y.o. year old male who presented to the ED tonight with Shortness of Breath, Weakness, and Rash     Patient received an handoff.  CHF exacerbation with new 3 L oxygen requirement and right-sided pleural effusion.  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  Current plan is for transfer to [facility] or [facility] depending on bed availability.  Dr. Julian Reil has already evaluated the patient we will place a consult note.  Hospitalist will reevaluate in the morning and determine if the patient will remain here at [facility] or ultimately be transferred.  Patient with purpura on all extremities and some on chest. VSS. Still hypoxic. Dr. Julian Reil has seen, add on abx, cultures, procalcitonin. Started diuresis. Pending call back from [facility].   D/w Dr. Threasa Beards at [facility]. Accepts to hospitalist service there pending transfer and report. Will take off list at [facility] when [facility] is on their way or we get clearance to bring him over.   Patient to [facility] with [facility] in stable condition here.   Vitals:   01/19/23 0326 01/19/23 0327  BP:    Pulse: 85   Resp: 20   Temp:  98.2 F (36.8 C)  SpO2: 100%      Labs, studies and imaging reviewed by myself and considered in medical decision making if ordered. Imaging interpreted by radiology.  Labs Reviewed  BASIC METABOLIC PANEL - Abnormal; Notable for the following components:      Result Value   Glucose, Bld 117 (*)    BUN 24 (*)    Calcium 8.1 (*)    All other components within normal limits  CBC - Abnormal; Notable for the following components:   RBC 3.39 (*)    Hemoglobin 7.1 (*)    HCT 28.2 (*)    MCH 20.9 (*)    MCHC 25.2 (*)    RDW 20.6 (*)    All other components within normal limits  HEPATIC FUNCTION PANEL - Abnormal; Notable for the following components:    Albumin 2.9 (*)    AST 58 (*)    Bilirubin, Direct 0.4 (*)    All other components within normal limits  BRAIN NATRIURETIC PEPTIDE - Abnormal; Notable for the following components:   B Natriuretic Peptide 367.1 (*)    All other components within normal limits  PROTIME-INR - Abnormal; Notable for the following components:   Prothrombin Time 28.9 (*)    INR 2.7 (*)    All other components within normal limits  SARS CORONAVIRUS 2 BY RT PCR  MAGNESIUM  SEDIMENTATION RATE  C-REACTIVE PROTEIN  TROPONIN I (HIGH SENSITIVITY)  TROPONIN I (HIGH SENSITIVITY)    CT Angio Chest PE W and/or Wo Contrast  Final Result    DG Chest Portable 1 View  Final Result      No follow-ups on file.     Cayli Escajeda, Barbara Cower, MD 01/19/23 [phone]

## 2023-01-19 NOTE — ED Notes (Signed)
[facility] will come get the patient

## 2023-01-19 NOTE — ED Notes (Signed)
[facility] called to tx pt [facility] can not come [facility] will arrive within 45 min

## 2023-01-19 NOTE — ED Notes (Signed)
[facility] will call in the AM for availability

## 2023-01-19 NOTE — ED Notes (Signed)
All images pushed over to [facility]

## 2023-01-20 DIAGNOSIS — M25552 Pain in left hip: Secondary | ICD-10-CM | POA: Diagnosis not present

## 2023-01-20 DIAGNOSIS — D692 Other nonthrombocytopenic purpura: Secondary | ICD-10-CM | POA: Diagnosis not present

## 2023-01-20 DIAGNOSIS — J810 Acute pulmonary edema: Secondary | ICD-10-CM | POA: Diagnosis not present

## 2023-01-20 LAB — ANCA TITERS
Atypical P-ANCA titer: <1:20 {titer}
C-ANCA: <1:20 {titer}
P-ANCA: <1:20 {titer}

## 2023-01-20 LAB — ENA+DNA/DS+ANTICH+CENTRO+JO...
Anti JO-1: <0.2 AI (ref 0.0–0.9)
Centromere Ab Screen: <0.2 AI (ref 0.0–0.9)
Chromatin Ab SerPl-aCnc: <0.2 AI (ref 0.0–0.9)
ENA SM Ab Ser-aCnc: <0.2 AI (ref 0.0–0.9)
Ribonucleic Protein: 1.1 AI — ABNORMAL HIGH (ref 0.0–0.9)
SSA (Ro) (ENA) Antibody, IgG: <0.2 AI (ref 0.0–0.9)
SSB (La) (ENA) Antibody, IgG: <0.2 AI (ref 0.0–0.9)
Scleroderma (Scl-70) (ENA) Antibody, IgG: <0.2 AI (ref 0.0–0.9)
ds DNA Ab: 1 IU/mL (ref 0–9)

## 2023-01-20 LAB — ANA W/REFLEX IF POSITIVE: Anti Nuclear Antibody (ANA): POSITIVE — AB

## 2023-01-20 LAB — CULTURE, BLOOD (ROUTINE X 2)
Culture: NO GROWTH
Culture: NO GROWTH
Special Requests: ADEQUATE

## 2023-01-20 LAB — COMPLEMENT, TOTAL: Compl, Total (CH50): 48 U/mL (ref >41)

## 2023-01-21 ENCOUNTER — Ambulatory Visit (HOSPITAL_COMMUNITY): Payer: Medicare Other | Admitting: Physician Assistant

## 2023-01-21 DIAGNOSIS — J810 Acute pulmonary edema: Secondary | ICD-10-CM | POA: Diagnosis not present

## 2023-01-21 DIAGNOSIS — D692 Other nonthrombocytopenic purpura: Secondary | ICD-10-CM | POA: Diagnosis not present

## 2023-01-21 DIAGNOSIS — R918 Other nonspecific abnormal finding of lung field: Secondary | ICD-10-CM | POA: Diagnosis not present

## 2023-01-21 DIAGNOSIS — J90 Pleural effusion, not elsewhere classified: Secondary | ICD-10-CM | POA: Diagnosis not present

## 2023-01-21 LAB — C3 COMPLEMENT: C3 Complement: 97 mg/dL (ref 82–167)

## 2023-01-21 LAB — C4 COMPLEMENT: Complement C4, Body Fluid: 19 mg/dL (ref 12–38)

## 2023-01-21 LAB — CULTURE, BLOOD (ROUTINE X 2): Special Requests: ADEQUATE

## 2023-01-22 DIAGNOSIS — M79642 Pain in left hand: Secondary | ICD-10-CM | POA: Diagnosis not present

## 2023-01-22 DIAGNOSIS — M25511 Pain in right shoulder: Secondary | ICD-10-CM | POA: Diagnosis not present

## 2023-01-22 DIAGNOSIS — M310 Hypersensitivity angiitis: Secondary | ICD-10-CM | POA: Diagnosis not present

## 2023-01-22 DIAGNOSIS — J90 Pleural effusion, not elsewhere classified: Secondary | ICD-10-CM | POA: Diagnosis not present

## 2023-01-22 DIAGNOSIS — D692 Other nonthrombocytopenic purpura: Secondary | ICD-10-CM | POA: Diagnosis not present

## 2023-01-23 DIAGNOSIS — D692 Other nonthrombocytopenic purpura: Secondary | ICD-10-CM | POA: Diagnosis not present

## 2023-01-23 DIAGNOSIS — R918 Other nonspecific abnormal finding of lung field: Secondary | ICD-10-CM | POA: Diagnosis not present

## 2023-01-24 DIAGNOSIS — D692 Other nonthrombocytopenic purpura: Secondary | ICD-10-CM | POA: Diagnosis not present

## 2023-01-24 DIAGNOSIS — J90 Pleural effusion, not elsewhere classified: Secondary | ICD-10-CM | POA: Diagnosis not present

## 2023-01-25 ENCOUNTER — Other Ambulatory Visit: Payer: Self-pay | Admitting: Physician Assistant

## 2023-01-25 DIAGNOSIS — D692 Other nonthrombocytopenic purpura: Secondary | ICD-10-CM | POA: Diagnosis not present

## 2023-01-26 DIAGNOSIS — R140 Abdominal distension (gaseous): Secondary | ICD-10-CM | POA: Diagnosis not present

## 2023-01-26 DIAGNOSIS — K5900 Constipation, unspecified: Secondary | ICD-10-CM | POA: Diagnosis not present

## 2023-01-26 DIAGNOSIS — D692 Other nonthrombocytopenic purpura: Secondary | ICD-10-CM | POA: Diagnosis not present

## 2023-01-26 DIAGNOSIS — M059 Rheumatoid arthritis with rheumatoid factor, unspecified: Secondary | ICD-10-CM | POA: Diagnosis not present

## 2023-01-26 DIAGNOSIS — R0602 Shortness of breath: Secondary | ICD-10-CM | POA: Diagnosis not present

## 2023-01-27 DIAGNOSIS — L988 Other specified disorders of the skin and subcutaneous tissue: Secondary | ICD-10-CM | POA: Diagnosis not present

## 2023-01-27 DIAGNOSIS — I358 Other nonrheumatic aortic valve disorders: Secondary | ICD-10-CM | POA: Diagnosis not present

## 2023-01-27 DIAGNOSIS — M310 Hypersensitivity angiitis: Secondary | ICD-10-CM | POA: Diagnosis not present

## 2023-01-27 DIAGNOSIS — D692 Other nonthrombocytopenic purpura: Secondary | ICD-10-CM | POA: Diagnosis not present

## 2023-01-27 DIAGNOSIS — I361 Nonrheumatic tricuspid (valve) insufficiency: Secondary | ICD-10-CM | POA: Diagnosis not present

## 2023-01-27 DIAGNOSIS — I270 Primary pulmonary hypertension: Secondary | ICD-10-CM | POA: Diagnosis not present

## 2023-01-27 NOTE — Telephone Encounter (Signed)
Rx(s) sent to pharmacy electronically.  

## 2023-01-28 DIAGNOSIS — J9811 Atelectasis: Secondary | ICD-10-CM | POA: Diagnosis not present

## 2023-01-28 DIAGNOSIS — J90 Pleural effusion, not elsewhere classified: Secondary | ICD-10-CM | POA: Diagnosis not present

## 2023-01-28 DIAGNOSIS — R1084 Generalized abdominal pain: Secondary | ICD-10-CM | POA: Diagnosis not present

## 2023-01-28 DIAGNOSIS — D692 Other nonthrombocytopenic purpura: Secondary | ICD-10-CM | POA: Diagnosis not present

## 2023-01-29 DIAGNOSIS — I4891 Unspecified atrial fibrillation: Secondary | ICD-10-CM | POA: Diagnosis not present

## 2023-01-29 DIAGNOSIS — I509 Heart failure, unspecified: Secondary | ICD-10-CM | POA: Diagnosis not present

## 2023-01-29 DIAGNOSIS — D692 Other nonthrombocytopenic purpura: Secondary | ICD-10-CM | POA: Diagnosis not present

## 2023-01-30 DIAGNOSIS — D692 Other nonthrombocytopenic purpura: Secondary | ICD-10-CM | POA: Diagnosis not present

## 2023-01-31 DIAGNOSIS — I509 Heart failure, unspecified: Secondary | ICD-10-CM | POA: Diagnosis not present

## 2023-02-01 DIAGNOSIS — I509 Heart failure, unspecified: Secondary | ICD-10-CM | POA: Diagnosis not present

## 2023-02-01 DIAGNOSIS — D692 Other nonthrombocytopenic purpura: Secondary | ICD-10-CM | POA: Diagnosis not present

## 2023-02-01 DIAGNOSIS — M059 Rheumatoid arthritis with rheumatoid factor, unspecified: Secondary | ICD-10-CM | POA: Diagnosis not present

## 2023-02-02 DIAGNOSIS — I509 Heart failure, unspecified: Secondary | ICD-10-CM | POA: Diagnosis not present

## 2023-02-03 DIAGNOSIS — I509 Heart failure, unspecified: Secondary | ICD-10-CM | POA: Diagnosis not present

## 2023-02-04 DIAGNOSIS — M0579 Rheumatoid arthritis with rheumatoid factor of multiple sites without organ or systems involvement: Secondary | ICD-10-CM | POA: Diagnosis not present

## 2023-02-04 DIAGNOSIS — J90 Pleural effusion, not elsewhere classified: Secondary | ICD-10-CM | POA: Diagnosis not present

## 2023-02-04 DIAGNOSIS — M310 Hypersensitivity angiitis: Secondary | ICD-10-CM | POA: Diagnosis not present

## 2023-02-04 DIAGNOSIS — I509 Heart failure, unspecified: Secondary | ICD-10-CM | POA: Diagnosis not present

## 2023-02-05 DIAGNOSIS — I509 Heart failure, unspecified: Secondary | ICD-10-CM | POA: Diagnosis not present

## 2023-02-06 DIAGNOSIS — I509 Heart failure, unspecified: Secondary | ICD-10-CM | POA: Diagnosis not present

## 2023-02-07 DIAGNOSIS — I509 Heart failure, unspecified: Secondary | ICD-10-CM | POA: Diagnosis not present

## 2023-02-08 ENCOUNTER — Telehealth: Payer: Self-pay

## 2023-02-08 DIAGNOSIS — I509 Heart failure, unspecified: Secondary | ICD-10-CM | POA: Diagnosis not present

## 2023-02-08 DIAGNOSIS — M310 Hypersensitivity angiitis: Secondary | ICD-10-CM | POA: Diagnosis not present

## 2023-02-08 NOTE — Telephone Encounter (Signed)
LM with pt to call back if he is interested in rescheduling his AF ablation.

## 2023-02-09 DIAGNOSIS — I509 Heart failure, unspecified: Secondary | ICD-10-CM | POA: Diagnosis not present

## 2023-02-10 DIAGNOSIS — N179 Acute kidney failure, unspecified: Secondary | ICD-10-CM | POA: Diagnosis not present

## 2023-02-10 DIAGNOSIS — I5033 Acute on chronic diastolic (congestive) heart failure: Secondary | ICD-10-CM | POA: Diagnosis not present

## 2023-02-10 DIAGNOSIS — J90 Pleural effusion, not elsewhere classified: Secondary | ICD-10-CM | POA: Diagnosis not present

## 2023-02-10 DIAGNOSIS — I4892 Unspecified atrial flutter: Secondary | ICD-10-CM | POA: Diagnosis not present

## 2023-02-10 DIAGNOSIS — E873 Alkalosis: Secondary | ICD-10-CM | POA: Diagnosis not present

## 2023-02-10 DIAGNOSIS — J810 Acute pulmonary edema: Secondary | ICD-10-CM | POA: Diagnosis not present

## 2023-02-10 DIAGNOSIS — J9612 Chronic respiratory failure with hypercapnia: Secondary | ICD-10-CM | POA: Diagnosis not present

## 2023-02-10 DIAGNOSIS — E8779 Other fluid overload: Secondary | ICD-10-CM | POA: Diagnosis not present

## 2023-02-10 DIAGNOSIS — I509 Heart failure, unspecified: Secondary | ICD-10-CM | POA: Diagnosis not present

## 2023-02-11 DIAGNOSIS — E8779 Other fluid overload: Secondary | ICD-10-CM | POA: Diagnosis not present

## 2023-02-11 DIAGNOSIS — N179 Acute kidney failure, unspecified: Secondary | ICD-10-CM | POA: Diagnosis not present

## 2023-02-11 DIAGNOSIS — I509 Heart failure, unspecified: Secondary | ICD-10-CM | POA: Diagnosis not present

## 2023-02-11 DIAGNOSIS — J9612 Chronic respiratory failure with hypercapnia: Secondary | ICD-10-CM | POA: Diagnosis not present

## 2023-02-11 DIAGNOSIS — E873 Alkalosis: Secondary | ICD-10-CM | POA: Diagnosis not present

## 2023-02-11 DIAGNOSIS — I5033 Acute on chronic diastolic (congestive) heart failure: Secondary | ICD-10-CM | POA: Diagnosis not present

## 2023-02-12 DIAGNOSIS — J9602 Acute respiratory failure with hypercapnia: Secondary | ICD-10-CM | POA: Diagnosis not present

## 2023-02-12 DIAGNOSIS — I509 Heart failure, unspecified: Secondary | ICD-10-CM | POA: Diagnosis not present

## 2023-02-13 DIAGNOSIS — I509 Heart failure, unspecified: Secondary | ICD-10-CM | POA: Diagnosis not present

## 2023-02-13 DIAGNOSIS — I5033 Acute on chronic diastolic (congestive) heart failure: Secondary | ICD-10-CM | POA: Diagnosis not present

## 2023-02-13 DIAGNOSIS — J9612 Chronic respiratory failure with hypercapnia: Secondary | ICD-10-CM | POA: Diagnosis not present

## 2023-02-13 DIAGNOSIS — E873 Alkalosis: Secondary | ICD-10-CM | POA: Diagnosis not present

## 2023-02-13 DIAGNOSIS — N179 Acute kidney failure, unspecified: Secondary | ICD-10-CM | POA: Diagnosis not present

## 2023-02-13 DIAGNOSIS — E8779 Other fluid overload: Secondary | ICD-10-CM | POA: Diagnosis not present

## 2023-02-14 DIAGNOSIS — I509 Heart failure, unspecified: Secondary | ICD-10-CM | POA: Diagnosis not present

## 2023-02-14 DIAGNOSIS — I5033 Acute on chronic diastolic (congestive) heart failure: Secondary | ICD-10-CM | POA: Diagnosis not present

## 2023-02-14 DIAGNOSIS — E8779 Other fluid overload: Secondary | ICD-10-CM | POA: Diagnosis not present

## 2023-02-14 DIAGNOSIS — N179 Acute kidney failure, unspecified: Secondary | ICD-10-CM | POA: Diagnosis not present

## 2023-02-14 DIAGNOSIS — E873 Alkalosis: Secondary | ICD-10-CM | POA: Diagnosis not present

## 2023-02-14 DIAGNOSIS — I4892 Unspecified atrial flutter: Secondary | ICD-10-CM | POA: Diagnosis not present

## 2023-02-14 DIAGNOSIS — J9612 Chronic respiratory failure with hypercapnia: Secondary | ICD-10-CM | POA: Diagnosis not present

## 2023-02-15 DIAGNOSIS — J9602 Acute respiratory failure with hypercapnia: Secondary | ICD-10-CM | POA: Diagnosis not present

## 2023-02-15 DIAGNOSIS — R918 Other nonspecific abnormal finding of lung field: Secondary | ICD-10-CM | POA: Diagnosis not present

## 2023-02-15 DIAGNOSIS — I509 Heart failure, unspecified: Secondary | ICD-10-CM | POA: Diagnosis not present

## 2023-02-16 DIAGNOSIS — Z7189 Other specified counseling: Secondary | ICD-10-CM | POA: Diagnosis not present

## 2023-02-16 DIAGNOSIS — R001 Bradycardia, unspecified: Secondary | ICD-10-CM | POA: Diagnosis not present

## 2023-02-16 DIAGNOSIS — I5030 Unspecified diastolic (congestive) heart failure: Secondary | ICD-10-CM | POA: Diagnosis not present

## 2023-02-16 DIAGNOSIS — R4781 Slurred speech: Secondary | ICD-10-CM | POA: Diagnosis not present

## 2023-02-16 DIAGNOSIS — I4892 Unspecified atrial flutter: Secondary | ICD-10-CM | POA: Diagnosis not present

## 2023-02-16 DIAGNOSIS — J90 Pleural effusion, not elsewhere classified: Secondary | ICD-10-CM | POA: Diagnosis not present

## 2023-02-16 DIAGNOSIS — R0989 Other specified symptoms and signs involving the circulatory and respiratory systems: Secondary | ICD-10-CM | POA: Diagnosis not present

## 2023-02-16 DIAGNOSIS — R911 Solitary pulmonary nodule: Secondary | ICD-10-CM | POA: Diagnosis not present

## 2023-02-16 DIAGNOSIS — E669 Obesity, unspecified: Secondary | ICD-10-CM | POA: Diagnosis not present

## 2023-02-16 DIAGNOSIS — Z743 Need for continuous supervision: Secondary | ICD-10-CM | POA: Diagnosis not present

## 2023-02-16 DIAGNOSIS — Z7901 Long term (current) use of anticoagulants: Secondary | ICD-10-CM | POA: Diagnosis not present

## 2023-02-16 DIAGNOSIS — R739 Hyperglycemia, unspecified: Secondary | ICD-10-CM | POA: Diagnosis not present

## 2023-02-16 DIAGNOSIS — R0602 Shortness of breath: Secondary | ICD-10-CM | POA: Diagnosis not present

## 2023-02-16 DIAGNOSIS — J9601 Acute respiratory failure with hypoxia: Secondary | ICD-10-CM | POA: Diagnosis not present

## 2023-02-16 DIAGNOSIS — Z8551 Personal history of malignant neoplasm of bladder: Secondary | ICD-10-CM | POA: Diagnosis not present

## 2023-02-16 DIAGNOSIS — R0902 Hypoxemia: Secondary | ICD-10-CM | POA: Diagnosis not present

## 2023-02-16 DIAGNOSIS — D849 Immunodeficiency, unspecified: Secondary | ICD-10-CM | POA: Diagnosis not present

## 2023-02-16 DIAGNOSIS — I517 Cardiomegaly: Secondary | ICD-10-CM | POA: Diagnosis not present

## 2023-02-16 DIAGNOSIS — M069 Rheumatoid arthritis, unspecified: Secondary | ICD-10-CM | POA: Diagnosis not present

## 2023-02-16 DIAGNOSIS — R2681 Unsteadiness on feet: Secondary | ICD-10-CM | POA: Diagnosis not present

## 2023-02-16 DIAGNOSIS — I5033 Acute on chronic diastolic (congestive) heart failure: Secondary | ICD-10-CM | POA: Diagnosis not present

## 2023-02-16 DIAGNOSIS — J4489 Other specified chronic obstructive pulmonary disease: Secondary | ICD-10-CM | POA: Diagnosis not present

## 2023-02-16 DIAGNOSIS — M5136 Other intervertebral disc degeneration, lumbar region: Secondary | ICD-10-CM | POA: Diagnosis not present

## 2023-02-16 DIAGNOSIS — R498 Other voice and resonance disorders: Secondary | ICD-10-CM | POA: Diagnosis not present

## 2023-02-16 DIAGNOSIS — J9600 Acute respiratory failure, unspecified whether with hypoxia or hypercapnia: Secondary | ICD-10-CM | POA: Diagnosis not present

## 2023-02-16 DIAGNOSIS — Z96642 Presence of left artificial hip joint: Secondary | ICD-10-CM | POA: Diagnosis not present

## 2023-02-16 DIAGNOSIS — B353 Tinea pedis: Secondary | ICD-10-CM | POA: Diagnosis not present

## 2023-02-16 DIAGNOSIS — E161 Other hypoglycemia: Secondary | ICD-10-CM | POA: Diagnosis not present

## 2023-02-16 DIAGNOSIS — I480 Paroxysmal atrial fibrillation: Secondary | ICD-10-CM | POA: Diagnosis not present

## 2023-02-16 DIAGNOSIS — I5023 Acute on chronic systolic (congestive) heart failure: Secondary | ICD-10-CM | POA: Diagnosis not present

## 2023-02-16 DIAGNOSIS — R41841 Cognitive communication deficit: Secondary | ICD-10-CM | POA: Diagnosis not present

## 2023-02-16 DIAGNOSIS — Z7401 Bed confinement status: Secondary | ICD-10-CM | POA: Diagnosis not present

## 2023-02-16 DIAGNOSIS — Z9981 Dependence on supplemental oxygen: Secondary | ICD-10-CM | POA: Diagnosis not present

## 2023-02-16 DIAGNOSIS — M6281 Muscle weakness (generalized): Secondary | ICD-10-CM | POA: Diagnosis not present

## 2023-02-16 DIAGNOSIS — I110 Hypertensive heart disease with heart failure: Secondary | ICD-10-CM | POA: Diagnosis not present

## 2023-02-16 DIAGNOSIS — R278 Other lack of coordination: Secondary | ICD-10-CM | POA: Diagnosis not present

## 2023-02-16 DIAGNOSIS — K5901 Slow transit constipation: Secondary | ICD-10-CM | POA: Diagnosis not present

## 2023-02-16 DIAGNOSIS — R1311 Dysphagia, oral phase: Secondary | ICD-10-CM | POA: Diagnosis not present

## 2023-02-16 DIAGNOSIS — J9602 Acute respiratory failure with hypercapnia: Secondary | ICD-10-CM | POA: Diagnosis not present

## 2023-02-16 DIAGNOSIS — Z9189 Other specified personal risk factors, not elsewhere classified: Secondary | ICD-10-CM | POA: Diagnosis not present

## 2023-02-16 DIAGNOSIS — R262 Difficulty in walking, not elsewhere classified: Secondary | ICD-10-CM | POA: Diagnosis not present

## 2023-02-16 DIAGNOSIS — R279 Unspecified lack of coordination: Secondary | ICD-10-CM | POA: Diagnosis not present

## 2023-02-16 DIAGNOSIS — M310 Hypersensitivity angiitis: Secondary | ICD-10-CM | POA: Diagnosis not present

## 2023-02-16 DIAGNOSIS — I469 Cardiac arrest, cause unspecified: Secondary | ICD-10-CM | POA: Diagnosis not present

## 2023-02-16 DIAGNOSIS — J449 Chronic obstructive pulmonary disease, unspecified: Secondary | ICD-10-CM | POA: Diagnosis not present

## 2023-02-16 DIAGNOSIS — D649 Anemia, unspecified: Secondary | ICD-10-CM | POA: Diagnosis not present

## 2023-02-16 DIAGNOSIS — Z8673 Personal history of transient ischemic attack (TIA), and cerebral infarction without residual deficits: Secondary | ICD-10-CM | POA: Diagnosis not present

## 2023-02-16 DIAGNOSIS — R0689 Other abnormalities of breathing: Secondary | ICD-10-CM | POA: Diagnosis not present

## 2023-02-16 DIAGNOSIS — M4986 Spondylopathy in diseases classified elsewhere, lumbar region: Secondary | ICD-10-CM | POA: Diagnosis not present

## 2023-02-16 DIAGNOSIS — J810 Acute pulmonary edema: Secondary | ICD-10-CM | POA: Diagnosis not present

## 2023-02-16 DIAGNOSIS — R404 Transient alteration of awareness: Secondary | ICD-10-CM | POA: Diagnosis not present

## 2023-02-17 DIAGNOSIS — M6281 Muscle weakness (generalized): Secondary | ICD-10-CM | POA: Diagnosis not present

## 2023-02-17 DIAGNOSIS — R2681 Unsteadiness on feet: Secondary | ICD-10-CM | POA: Diagnosis not present

## 2023-02-17 DIAGNOSIS — J9601 Acute respiratory failure with hypoxia: Secondary | ICD-10-CM | POA: Diagnosis not present

## 2023-02-17 DIAGNOSIS — Z9981 Dependence on supplemental oxygen: Secondary | ICD-10-CM | POA: Diagnosis not present

## 2023-02-17 DIAGNOSIS — E669 Obesity, unspecified: Secondary | ICD-10-CM | POA: Diagnosis not present

## 2023-02-18 ENCOUNTER — Ambulatory Visit: Payer: Medicare Other | Admitting: Cardiovascular Disease

## 2023-02-18 ENCOUNTER — Other Ambulatory Visit: Payer: Medicare Other

## 2023-02-18 DIAGNOSIS — Z8673 Personal history of transient ischemic attack (TIA), and cerebral infarction without residual deficits: Secondary | ICD-10-CM | POA: Diagnosis not present

## 2023-02-18 DIAGNOSIS — M5136 Other intervertebral disc degeneration, lumbar region: Secondary | ICD-10-CM | POA: Diagnosis not present

## 2023-02-18 DIAGNOSIS — I5023 Acute on chronic systolic (congestive) heart failure: Secondary | ICD-10-CM | POA: Diagnosis not present

## 2023-02-18 DIAGNOSIS — Z7901 Long term (current) use of anticoagulants: Secondary | ICD-10-CM | POA: Diagnosis not present

## 2023-02-18 DIAGNOSIS — B353 Tinea pedis: Secondary | ICD-10-CM | POA: Diagnosis not present

## 2023-02-18 DIAGNOSIS — Z9189 Other specified personal risk factors, not elsewhere classified: Secondary | ICD-10-CM | POA: Diagnosis not present

## 2023-02-18 DIAGNOSIS — M310 Hypersensitivity angiitis: Secondary | ICD-10-CM | POA: Diagnosis not present

## 2023-02-18 DIAGNOSIS — J90 Pleural effusion, not elsewhere classified: Secondary | ICD-10-CM | POA: Diagnosis not present

## 2023-02-18 DIAGNOSIS — I110 Hypertensive heart disease with heart failure: Secondary | ICD-10-CM | POA: Diagnosis not present

## 2023-02-18 DIAGNOSIS — K5901 Slow transit constipation: Secondary | ICD-10-CM | POA: Diagnosis not present

## 2023-02-18 DIAGNOSIS — J4489 Other specified chronic obstructive pulmonary disease: Secondary | ICD-10-CM | POA: Diagnosis not present

## 2023-02-18 DIAGNOSIS — R911 Solitary pulmonary nodule: Secondary | ICD-10-CM | POA: Diagnosis not present

## 2023-02-18 DIAGNOSIS — Z8551 Personal history of malignant neoplasm of bladder: Secondary | ICD-10-CM | POA: Diagnosis not present

## 2023-02-18 DIAGNOSIS — D849 Immunodeficiency, unspecified: Secondary | ICD-10-CM | POA: Diagnosis not present

## 2023-02-18 DIAGNOSIS — M4986 Spondylopathy in diseases classified elsewhere, lumbar region: Secondary | ICD-10-CM | POA: Diagnosis not present

## 2023-02-18 DIAGNOSIS — M069 Rheumatoid arthritis, unspecified: Secondary | ICD-10-CM | POA: Diagnosis not present

## 2023-02-18 DIAGNOSIS — I480 Paroxysmal atrial fibrillation: Secondary | ICD-10-CM | POA: Diagnosis not present

## 2023-02-18 DIAGNOSIS — R0689 Other abnormalities of breathing: Secondary | ICD-10-CM | POA: Diagnosis not present

## 2023-02-18 DIAGNOSIS — D649 Anemia, unspecified: Secondary | ICD-10-CM | POA: Diagnosis not present

## 2023-02-19 ENCOUNTER — Emergency Department (HOSPITAL_COMMUNITY): Payer: Medicare Other

## 2023-02-19 ENCOUNTER — Emergency Department (HOSPITAL_COMMUNITY)
Admission: EM | Admit: 2023-02-19 | Discharge: 2023-02-20 | Disposition: A | Discharge status: Home / Self Care | Payer: Medicare Other | Source: Home / Self Care | Attending: Emergency Medicine | Admitting: Emergency Medicine

## 2023-02-19 DIAGNOSIS — J449 Chronic obstructive pulmonary disease, unspecified: Secondary | ICD-10-CM | POA: Diagnosis not present

## 2023-02-19 DIAGNOSIS — I517 Cardiomegaly: Secondary | ICD-10-CM | POA: Diagnosis not present

## 2023-02-19 DIAGNOSIS — Z7901 Long term (current) use of anticoagulants: Secondary | ICD-10-CM | POA: Insufficient documentation

## 2023-02-19 DIAGNOSIS — Z9981 Dependence on supplemental oxygen: Secondary | ICD-10-CM | POA: Diagnosis not present

## 2023-02-19 DIAGNOSIS — I5023 Acute on chronic systolic (congestive) heart failure: Secondary | ICD-10-CM | POA: Diagnosis not present

## 2023-02-19 DIAGNOSIS — M310 Hypersensitivity angiitis: Secondary | ICD-10-CM | POA: Diagnosis not present

## 2023-02-19 DIAGNOSIS — J90 Pleural effusion, not elsewhere classified: Secondary | ICD-10-CM | POA: Diagnosis not present

## 2023-02-19 DIAGNOSIS — J4489 Other specified chronic obstructive pulmonary disease: Secondary | ICD-10-CM | POA: Diagnosis not present

## 2023-02-19 DIAGNOSIS — R0989 Other specified symptoms and signs involving the circulatory and respiratory systems: Secondary | ICD-10-CM | POA: Diagnosis not present

## 2023-02-19 DIAGNOSIS — R0689 Other abnormalities of breathing: Secondary | ICD-10-CM | POA: Diagnosis not present

## 2023-02-19 DIAGNOSIS — R0602 Shortness of breath: Secondary | ICD-10-CM | POA: Diagnosis not present

## 2023-02-19 DIAGNOSIS — Z7189 Other specified counseling: Secondary | ICD-10-CM | POA: Diagnosis not present

## 2023-02-19 DIAGNOSIS — I480 Paroxysmal atrial fibrillation: Secondary | ICD-10-CM | POA: Diagnosis not present

## 2023-02-19 LAB — I-STAT VENOUS BLOOD GAS, ED
Acid-Base Excess: 17 mmol/L — ABNORMAL HIGH (ref 0.0–2.0)
Bicarbonate: 44.8 mmol/L — ABNORMAL HIGH (ref 20.0–28.0)
Calcium, Ion: 1.08 mmol/L — ABNORMAL LOW (ref 1.15–1.40)
HCT: 27 % — ABNORMAL LOW (ref 39.0–52.0)
Hemoglobin: 9.2 g/dL — ABNORMAL LOW (ref 13.0–17.0)
O2 Saturation: 86 %
Potassium: 4.7 mmol/L (ref 3.5–5.1)
Sodium: 143 mmol/L (ref 135–145)
TCO2: 47 mmol/L — ABNORMAL HIGH (ref 22–32)
pCO2, Ven: 79.7 mmHg (ref 44–60)
pH, Ven: 7.358 (ref 7.25–7.43)
pO2, Ven: 56 mmHg — ABNORMAL HIGH (ref 32–45)

## 2023-02-19 LAB — BASIC METABOLIC PANEL
Anion gap: 12 (ref 5–15)
BUN: 23 mg/dL (ref 8–23)
CO2: 38 mmol/L — ABNORMAL HIGH (ref 22–32)
Calcium: 8.4 mg/dL — ABNORMAL LOW (ref 8.9–10.3)
Chloride: 95 mmol/L — ABNORMAL LOW (ref 98–111)
Creatinine, Ser: 1.12 mg/dL (ref 0.61–1.24)
GFR, Estimated: >60 mL/min (ref >60)
Glucose, Bld: 122 mg/dL — ABNORMAL HIGH (ref 70–99)
Potassium: 4.8 mmol/L (ref 3.5–5.1)
Sodium: 145 mmol/L (ref 135–145)

## 2023-02-19 LAB — CBC WITH DIFFERENTIAL/PLATELET
Abs Immature Granulocytes: 0.23 10*3/uL — ABNORMAL HIGH (ref 0.00–0.07)
Basophils Absolute: 0.1 10*3/uL (ref 0.0–0.1)
Basophils Relative: 1 %
Eosinophils Absolute: 0 10*3/uL (ref 0.0–0.5)
Eosinophils Relative: 0 %
HCT: 29.1 % — ABNORMAL LOW (ref 39.0–52.0)
Hemoglobin: 7.4 g/dL — ABNORMAL LOW (ref 13.0–17.0)
Immature Granulocytes: 2 %
Lymphocytes Relative: 8 %
Lymphs Abs: 0.8 10*3/uL (ref 0.7–4.0)
MCH: 23.3 pg — ABNORMAL LOW (ref 26.0–34.0)
MCHC: 25.4 g/dL — ABNORMAL LOW (ref 30.0–36.0)
MCV: 91.8 fL (ref 80.0–100.0)
Monocytes Absolute: 0.8 10*3/uL (ref 0.1–1.0)
Monocytes Relative: 8 %
Neutro Abs: 8.3 10*3/uL — ABNORMAL HIGH (ref 1.7–7.7)
Neutrophils Relative %: 81 %
Platelets: 239 10*3/uL (ref 150–400)
RBC: 3.17 MIL/uL — ABNORMAL LOW (ref 4.22–5.81)
RDW: 23.4 % — ABNORMAL HIGH (ref 11.5–15.5)
WBC: 10.2 10*3/uL (ref 4.0–10.5)
nRBC: 0 % (ref 0.0–0.2)

## 2023-02-19 NOTE — ED Triage Notes (Addendum)
Pt via EMS from [facility] for reports of elevated CO2 of 41. Was found to be at 86% on 3L [place] which he wears at baseline. Pt has hx of COPD. Noted congested and non productive cough.

## 2023-02-19 NOTE — ED Provider Notes (Signed)
[place] EMERGENCY DEPARTMENT AT [facility] Provider Note   CSN: [id number] Arrival date & time: 02/19/23  2036     History  Chief Complaint  Patient presents with   Shortness of Breath    Brian [name] is a 72 y.o. male.  72 yo M with a chief complaint of an elevated bicarb level.  Patient is in a new skilled nursing facility for him.  He was seen today and blood work sent.  He was then told he needed to come to the emergency department for his bicarb level being 41.  He denies any significant change from his baseline.  Tells me supposed be on 5 L oxygen at all times for his COPD.   Shortness of Breath      Home Medications Prior to Admission medications   Medication Sig Start Date End Date Taking? Authorizing Provider  acetaminophen (TYLENOL) 500 MG tablet Take 500 mg by mouth every 6 (six) hours as needed for headache.    [provider]  amiodarone (PACERONE) 200 MG tablet Take 1 tablet (200 mg total) by mouth daily. 01/27/23   Lanier Prude, MD  budesonide-formoterol [facility]) 160-4.5 MCG/ACT inhaler Inhale 2 puffs into the lungs 2 (two) times daily. 10/17/22   Rai, Ripudeep Kirtland Bouchard, MD  diltiazem (CARDIZEM CD) 120 MG 24 hr capsule TAKE 1 CAPSULE BY MOUTH EVERY DAY 12/29/22   Jake Bathe, MD  folic acid (FOLVITE) 1 MG tablet Take 1 mg by mouth daily. 02/14/22   [provider]  furosemide (LASIX) 40 MG tablet Take 1 tablet (40 mg total) by mouth 2 (two) times daily. Patient taking differently: Take 80 mg by mouth daily. 01/10/23   Vassie Loll, MD  golimumab (SIMPONI ARIA) 50 MG/4ML SOLN injection Inject 50 mg into the vein every 8 (eight) weeks.    [provider]  hydroxychloroquine (PLAQUENIL) 200 MG tablet Take 1 tablet (200 mg total) by mouth 2 (two) times daily. 12/20/22   Azucena Fallen, MD  KLOR-CON M20 20 MEQ tablet TAKE 1 TABLET BY MOUTH EVERY DAY Patient taking differently: Take 20 mEq by mouth daily. 01/21/22    Jake Bathe, MD  levalbuterol [facility] HFA) 45 MCG/ACT inhaler Inhale 1-2 puffs into the lungs every 4 (four) hours as needed for wheezing or shortness of breath. 10/17/22 10/17/23  Rai, Delene Ruffini, MD  methotrexate (RHEUMATREX) 2.5 MG tablet Take 25 mg by mouth every Friday. 10 tablets once weekly 04/26/17   [provider]  pantoprazole (PROTONIX) 40 MG tablet Take 1 tablet (40 mg total) by mouth daily. 10/17/22 02/14/23  Rai, Ripudeep K, MD  polyethylene glycol (MIRALAX / GLYCOLAX) 17 g packet Take 17 g by mouth daily. Continue daily while on iron. Patient taking differently: Take 17 g by mouth daily as needed for mild constipation. 12/20/22   Azucena Fallen, MD  rivaroxaban (XARELTO) 20 MG TABS tablet Take 1 tablet (20 mg total) by mouth daily with supper. 12/23/22   Azucena Fallen, MD  spironolactone (ALDACTONE) 25 MG tablet Take 0.5 tablets (12.5 mg total) by mouth daily. 01/11/23   Vassie Loll, MD  tamsulosin (FLOMAX) 0.4 MG CAPS capsule Take 0.4 mg by mouth at bedtime.    [provider]      Allergies    Aquacel-ag surgical hydrofiber [wound dressings] and Tape    Review of Systems   Review of Systems  Respiratory:  Positive for shortness of breath.     Physical  Exam Updated Vital Signs BP 126/70 (BP Location: Right Arm)   Pulse 90   Temp 98.3 F (36.8 C) (Oral)   Resp (!) 24   Wt 120.7 kg   SpO2 96%   BMI 38.75 kg/m  Physical Exam Vitals and nursing note reviewed.  Constitutional:      Appearance: He is well-developed.  HENT:     Head: Normocephalic and atraumatic.  Eyes:     Pupils: Pupils are equal, round, and reactive to light.  Neck:     Vascular: No JVD.  Cardiovascular:     Rate and Rhythm: Normal rate and regular rhythm.     Heart sounds: No murmur heard.    No friction rub. No gallop.  Pulmonary:     Effort: No respiratory distress.     Breath sounds: No wheezing.  Abdominal:     General: There is no distension.      Tenderness: There is no abdominal tenderness. There is no guarding or rebound.  Musculoskeletal:        General: Normal range of motion.     Cervical back: Normal range of motion and neck supple.  Skin:    Coloration: Skin is not pale.     Findings: No rash.  Neurological:     Mental Status: He is alert and oriented to person, place, and time.  Psychiatric:        Behavior: Behavior normal.     ED Results / Procedures / Treatments   Labs (all labs ordered are listed, but only abnormal results are displayed) Labs Reviewed  CBC WITH DIFFERENTIAL/PLATELET - Abnormal; Notable for the following components:      Result Value   RBC 3.17 (*)    Hemoglobin 7.4 (*)    HCT 29.1 (*)    MCH 23.3 (*)    MCHC 25.4 (*)    RDW 23.4 (*)    Neutro Abs 8.3 (*)    Abs Immature Granulocytes 0.23 (*)    All other components within normal limits  BASIC METABOLIC PANEL - Abnormal; Notable for the following components:   Chloride 95 (*)    CO2 38 (*)    Glucose, Bld 122 (*)    Calcium 8.4 (*)    All other components within normal limits  I-STAT VENOUS BLOOD GAS, ED - Abnormal; Notable for the following components:   pCO2, Ven 79.7 (*)    pO2, Ven 56 (*)    Bicarbonate 44.8 (*)    TCO2 47 (*)    Acid-Base Excess 17.0 (*)    Calcium, Ion 1.08 (*)    HCT 27.0 (*)    Hemoglobin 9.2 (*)    All other components within normal limits    EKG None  Radiology DG Chest Port 1 View  Result Date: 02/19/2023 CLINICAL DATA:  Shortness of breath EXAM: PORTABLE CHEST 1 VIEW COMPARISON:  01/18/2023 FINDINGS: Cardiac shadow is enlarged. Aortic calcifications are again seen. Increased vascular congestion is noted without significant edema. New right-sided pleural effusion is noted. No focal infiltrate is seen. No bony abnormality is noted. IMPRESSION: Mild vascular congestion and right-sided effusion. Electronically Signed   By: Alcide Clever M.D.   On: 02/19/2023 21:31    Procedures Procedures     Medications Ordered in ED Medications - No data to display  ED Course/ Medical Decision Making/ A&P  Medical Decision Making Amount and/or Complexity of Data Reviewed Labs: ordered. Radiology: ordered.   72 yo M with a chief complaints of an elevated bicarb level.  On my record review this is actually normal for him to have a bicarb of 41.  Will obtain repeat labs chest x-ray.  He has clear lung sounds for me.  Chest x-ray independently interpreted by me without focal infiltrate or pneumothorax.  Patient's VBG with hypercarbia.  Appears to be at his baseline, compared to his recent admission to [facility] facility had pCO2's in the 60s and 70s post BiPAP.  Patient's bicarb in the 40s here, consistent with prior checks for the past few months.  Feel he is at his baseline he is having no confusion.  Will discharge home.  11:05 PM:  I have discussed the diagnosis/risks/treatment options with the patient and family.  Evaluation and diagnostic testing in the emergency department does not suggest an emergent condition requiring admission or immediate intervention beyond what has been performed at this time.  They will follow up with PCP. We also discussed returning to the ED immediately if new or worsening sx occur. We discussed the sx which are most concerning (e.g., sudden worsening pain, fever, inability to tolerate by mouth) that necessitate immediate return. Medications administered to the patient during their visit and any new prescriptions provided to the patient are listed below.  Medications given during this visit Medications - No data to display   The patient appears reasonably screen and/or stabilized for discharge and I doubt any other medical condition or other [facility] requiring further screening, evaluation, or treatment in the ED at this time prior to discharge.          Final Clinical Impression(s) / ED Diagnoses Final diagnoses:   Hypercapnia    Rx / DC Orders ED Discharge Orders     None         Melene Plan, DO 02/19/23 2305

## 2023-02-19 NOTE — Discharge Instructions (Signed)
Return for worsening sob or confusion. Follow up with your doctor in the office.

## 2023-02-19 NOTE — ED Notes (Signed)
[facility] called, 3rd in line 

## 2023-02-19 NOTE — ED Notes (Signed)
[facility] being set up for transportation back to facility.

## 2023-02-28 DEATH — deceased

## 2023-03-08 ENCOUNTER — Other Ambulatory Visit (HOSPITAL_COMMUNITY): Payer: Medicare Other

## 2023-04-09 ENCOUNTER — Ambulatory Visit: Payer: Medicare Other | Admitting: Cardiovascular Disease
